# Patient Record
Sex: Female | Born: 1955 | Race: White | Hispanic: No | Smoking: Former smoker
Health system: Southern US, Community
[De-identification: ages and names within clinical notes are randomized; demographics above are authoritative.]

## PROBLEM LIST (undated history)

## (undated) DIAGNOSIS — C44300 Unspecified malignant neoplasm of skin of unspecified part of face: Secondary | ICD-10-CM

## (undated) DIAGNOSIS — I10 Essential (primary) hypertension: Secondary | ICD-10-CM

## (undated) DIAGNOSIS — K219 Gastro-esophageal reflux disease without esophagitis: Secondary | ICD-10-CM

## (undated) DIAGNOSIS — E785 Hyperlipidemia, unspecified: Secondary | ICD-10-CM

## (undated) DIAGNOSIS — F329 Major depressive disorder, single episode, unspecified: Secondary | ICD-10-CM

## (undated) DIAGNOSIS — B009 Herpesviral infection, unspecified: Secondary | ICD-10-CM

## (undated) DIAGNOSIS — I499 Cardiac arrhythmia, unspecified: Secondary | ICD-10-CM

## (undated) DIAGNOSIS — F419 Anxiety disorder, unspecified: Secondary | ICD-10-CM

## (undated) DIAGNOSIS — Z9689 Presence of other specified functional implants: Secondary | ICD-10-CM

## (undated) DIAGNOSIS — R000 Tachycardia, unspecified: Secondary | ICD-10-CM

## (undated) DIAGNOSIS — K5289 Other specified noninfective gastroenteritis and colitis: Secondary | ICD-10-CM

## (undated) DIAGNOSIS — Z79899 Other long term (current) drug therapy: Secondary | ICD-10-CM

## (undated) DIAGNOSIS — R7303 Prediabetes: Secondary | ICD-10-CM

## (undated) DIAGNOSIS — S72002K Fracture of unspecified part of neck of left femur, subsequent encounter for closed fracture with nonunion: Secondary | ICD-10-CM

## (undated) DIAGNOSIS — Z87442 Personal history of urinary calculi: Secondary | ICD-10-CM

## (undated) DIAGNOSIS — F32A Depression, unspecified: Secondary | ICD-10-CM

## (undated) DIAGNOSIS — J209 Acute bronchitis, unspecified: Secondary | ICD-10-CM

## (undated) HISTORY — DX: Other specified noninfective gastroenteritis and colitis: K52.89

## (undated) HISTORY — DX: Gastro-esophageal reflux disease without esophagitis: K21.9

## (undated) HISTORY — DX: Hyperlipidemia, unspecified: E78.5

## (undated) HISTORY — PX: APPENDECTOMY: SHX54

## (undated) HISTORY — DX: Prediabetes: R73.03

## (undated) HISTORY — PX: ABDOMINAL HYSTERECTOMY: SHX81

## (undated) HISTORY — PX: BACK SURGERY: SHX140

## (undated) HISTORY — PX: OTHER SURGICAL HISTORY: SHX169

## (undated) HISTORY — DX: Tachycardia, unspecified: R00.0

## (undated) HISTORY — PX: SHOULDER SURGERY: SHX246

## (undated) HISTORY — PX: BREAST EXCISIONAL BIOPSY: SUR124

## (undated) HISTORY — DX: Presence of other specified functional implants: Z96.89

## (undated) HISTORY — DX: Acute bronchitis, unspecified: J20.9

## (undated) HISTORY — DX: Other long term (current) drug therapy: Z79.899

## (undated) HISTORY — DX: Herpesviral infection, unspecified: B00.9

## (undated) HISTORY — DX: Unspecified malignant neoplasm of skin of unspecified part of face: C44.300

---

## 2004-11-24 ENCOUNTER — Ambulatory Visit: Payer: Self-pay | Admitting: Occupational Therapy

## 2005-12-18 ENCOUNTER — Ambulatory Visit: Payer: Self-pay | Admitting: Internal Medicine

## 2006-01-12 ENCOUNTER — Ambulatory Visit: Payer: Self-pay | Admitting: Internal Medicine

## 2006-07-02 ENCOUNTER — Ambulatory Visit: Payer: Self-pay | Admitting: Internal Medicine

## 2006-09-26 ENCOUNTER — Ambulatory Visit: Payer: Self-pay | Admitting: Gastroenterology

## 2006-10-04 ENCOUNTER — Ambulatory Visit: Payer: Self-pay | Admitting: Internal Medicine

## 2007-09-23 ENCOUNTER — Ambulatory Visit: Payer: Self-pay | Admitting: Internal Medicine

## 2007-10-08 ENCOUNTER — Ambulatory Visit: Payer: Self-pay | Admitting: Internal Medicine

## 2007-10-10 ENCOUNTER — Ambulatory Visit: Payer: Self-pay | Admitting: Internal Medicine

## 2008-07-20 ENCOUNTER — Ambulatory Visit (HOSPITAL_COMMUNITY): Admission: RE | Admit: 2008-07-20 | Discharge: 2008-07-20 | Payer: Self-pay | Admitting: Urology

## 2008-12-11 ENCOUNTER — Ambulatory Visit: Payer: Self-pay | Admitting: Family Medicine

## 2008-12-11 DIAGNOSIS — I471 Supraventricular tachycardia: Secondary | ICD-10-CM | POA: Insufficient documentation

## 2008-12-11 DIAGNOSIS — K219 Gastro-esophageal reflux disease without esophagitis: Secondary | ICD-10-CM | POA: Insufficient documentation

## 2008-12-11 DIAGNOSIS — M810 Age-related osteoporosis without current pathological fracture: Secondary | ICD-10-CM | POA: Insufficient documentation

## 2008-12-11 DIAGNOSIS — E876 Hypokalemia: Secondary | ICD-10-CM | POA: Insufficient documentation

## 2008-12-11 DIAGNOSIS — Z87442 Personal history of urinary calculi: Secondary | ICD-10-CM | POA: Insufficient documentation

## 2008-12-11 DIAGNOSIS — M159 Polyosteoarthritis, unspecified: Secondary | ICD-10-CM | POA: Insufficient documentation

## 2008-12-11 DIAGNOSIS — J309 Allergic rhinitis, unspecified: Secondary | ICD-10-CM | POA: Insufficient documentation

## 2008-12-11 DIAGNOSIS — G43009 Migraine without aura, not intractable, without status migrainosus: Secondary | ICD-10-CM | POA: Insufficient documentation

## 2008-12-11 DIAGNOSIS — N3946 Mixed incontinence: Secondary | ICD-10-CM | POA: Insufficient documentation

## 2008-12-11 DIAGNOSIS — F341 Dysthymic disorder: Secondary | ICD-10-CM | POA: Insufficient documentation

## 2008-12-22 ENCOUNTER — Ambulatory Visit (HOSPITAL_COMMUNITY): Admission: RE | Admit: 2008-12-22 | Discharge: 2008-12-22 | Payer: Self-pay | Admitting: Family Medicine

## 2009-01-08 ENCOUNTER — Ambulatory Visit (HOSPITAL_COMMUNITY): Admission: RE | Admit: 2009-01-08 | Discharge: 2009-01-08 | Payer: Self-pay | Admitting: Orthopedic Surgery

## 2009-05-14 ENCOUNTER — Encounter: Payer: Self-pay | Admitting: Family Medicine

## 2009-05-14 ENCOUNTER — Ambulatory Visit: Payer: Self-pay | Admitting: Family Medicine

## 2009-05-14 DIAGNOSIS — K5289 Other specified noninfective gastroenteritis and colitis: Secondary | ICD-10-CM

## 2009-05-14 HISTORY — DX: Other specified noninfective gastroenteritis and colitis: K52.89

## 2009-05-16 ENCOUNTER — Emergency Department: Payer: Self-pay | Admitting: Emergency Medicine

## 2009-06-29 ENCOUNTER — Ambulatory Visit: Payer: Self-pay | Admitting: Family Medicine

## 2009-06-29 DIAGNOSIS — E782 Mixed hyperlipidemia: Secondary | ICD-10-CM | POA: Insufficient documentation

## 2009-06-29 LAB — CONVERTED CEMR LAB: LDL Cholesterol: 167 mg/dL

## 2009-07-01 ENCOUNTER — Encounter (INDEPENDENT_AMBULATORY_CARE_PROVIDER_SITE_OTHER): Payer: Self-pay | Admitting: *Deleted

## 2009-07-01 LAB — CONVERTED CEMR LAB
ALT: 16 units/L (ref 0–35)
AST: 24 units/L (ref 0–37)
Alkaline Phosphatase: 53 units/L (ref 39–117)
Bilirubin, Direct: 0.1 mg/dL (ref 0.0–0.3)
Chloride: 107 meq/L (ref 96–112)
Cholesterol: 269 mg/dL — ABNORMAL HIGH (ref 0–200)
Direct LDL: 167.9 mg/dL
HDL: 42.3 mg/dL (ref >39.00)
Sodium: 142 meq/L (ref 135–145)
Total CHOL/HDL Ratio: 6
Total Protein: 7.5 g/dL (ref 6.0–8.3)
Triglycerides: 258 mg/dL — ABNORMAL HIGH (ref 0.0–149.0)
VLDL: 51.6 mg/dL — ABNORMAL HIGH (ref 0.0–40.0)

## 2009-07-16 ENCOUNTER — Ambulatory Visit: Payer: Self-pay | Admitting: Family Medicine

## 2009-08-09 ENCOUNTER — Ambulatory Visit (HOSPITAL_COMMUNITY): Admission: RE | Admit: 2009-08-09 | Discharge: 2009-08-09 | Payer: Self-pay | Admitting: Family Medicine

## 2009-08-09 ENCOUNTER — Encounter: Payer: Self-pay | Admitting: Family Medicine

## 2009-08-13 ENCOUNTER — Ambulatory Visit: Payer: Self-pay | Admitting: Family Medicine

## 2009-08-17 DIAGNOSIS — M8580 Other specified disorders of bone density and structure, unspecified site: Secondary | ICD-10-CM | POA: Insufficient documentation

## 2009-08-18 ENCOUNTER — Encounter (INDEPENDENT_AMBULATORY_CARE_PROVIDER_SITE_OTHER): Payer: Self-pay | Admitting: *Deleted

## 2009-11-23 ENCOUNTER — Ambulatory Visit: Payer: Self-pay | Admitting: Family Medicine

## 2009-12-03 ENCOUNTER — Ambulatory Visit: Payer: Self-pay | Admitting: Family Medicine

## 2009-12-03 DIAGNOSIS — K5909 Other constipation: Secondary | ICD-10-CM | POA: Insufficient documentation

## 2010-01-08 ENCOUNTER — Ambulatory Visit: Payer: Self-pay | Admitting: Family Medicine

## 2010-02-01 ENCOUNTER — Ambulatory Visit: Payer: Self-pay | Admitting: Family Medicine

## 2010-02-01 LAB — CONVERTED CEMR LAB
AST: 25 units/L (ref 0–37)
Basophils Absolute: 0 10*3/uL (ref 0.0–0.1)
Bilirubin, Direct: 0.1 mg/dL (ref 0.0–0.3)
Calcium: 9 mg/dL (ref 8.4–10.5)
Chloride: 108 meq/L (ref 96–112)
Direct LDL: 197.3 mg/dL
Eosinophils Relative: 1.9 % (ref 0.0–5.0)
GFR calc non Af Amer: 127.71 mL/min (ref >60)
Hemoglobin: 13.1 g/dL (ref 12.0–15.0)
Lymphocytes Relative: 39.3 % (ref 12.0–46.0)
MCHC: 35.2 g/dL (ref 30.0–36.0)
Monocytes Absolute: 0.5 10*3/uL (ref 0.1–1.0)
Neutrophils Relative %: 48.4 % (ref 43.0–77.0)
Platelets: 231 10*3/uL (ref 150.0–400.0)
Potassium: 4.1 meq/L (ref 3.5–5.1)
RBC: 4.05 M/uL (ref 3.87–5.11)
RDW: 14 % (ref 11.5–14.6)
TSH: 0.7 microintl units/mL (ref 0.35–5.50)
Total Bilirubin: 0.5 mg/dL (ref 0.3–1.2)
Total Protein: 7 g/dL (ref 6.0–8.3)

## 2010-02-09 ENCOUNTER — Ambulatory Visit: Payer: Self-pay | Admitting: Family Medicine

## 2010-03-01 ENCOUNTER — Telehealth: Payer: Self-pay | Admitting: Family Medicine

## 2010-05-12 ENCOUNTER — Ambulatory Visit: Payer: Self-pay | Admitting: Family Medicine

## 2010-06-14 ENCOUNTER — Encounter: Payer: Self-pay | Admitting: Family Medicine

## 2010-06-19 ENCOUNTER — Encounter: Payer: Self-pay | Admitting: Family Medicine

## 2010-06-23 ENCOUNTER — Emergency Department (HOSPITAL_COMMUNITY)
Admission: EM | Admit: 2010-06-23 | Discharge: 2010-06-23 | Payer: Self-pay | Source: Home / Self Care | Admitting: Emergency Medicine

## 2010-06-25 ENCOUNTER — Encounter: Payer: Self-pay | Admitting: Family Medicine

## 2010-06-25 ENCOUNTER — Ambulatory Visit
Admission: RE | Admit: 2010-06-25 | Discharge: 2010-06-25 | Payer: Self-pay | Source: Home / Self Care | Attending: Family Medicine | Admitting: Family Medicine

## 2010-06-25 DIAGNOSIS — J209 Acute bronchitis, unspecified: Secondary | ICD-10-CM | POA: Insufficient documentation

## 2010-06-25 HISTORY — DX: Acute bronchitis, unspecified: J20.9

## 2010-06-27 ENCOUNTER — Encounter: Payer: Self-pay | Admitting: Family Medicine

## 2010-06-30 NOTE — Progress Notes (Signed)
Summary: needs something for indegestion  Phone Note Call from Patient Call back at Home Phone [phone]   Caller: Patient Call For: Kerby Nora MD Summary of Call: Pt has been taking otc prilosec twice a day for indigestion.  This is not helping, she would like to try something else, she says indigestion is getting really bad.   Uses [facility] [address]. Initial call taken by: Lowella Petties CMA,  March 01, 2010 3:32 PM  Follow-up for Phone Call         MAke sure no exertional chest pain, shortness of breath or new fatigue...if so need appt to be seen ASAP. Otherwise...will start on pantoprazole..if no improvement in 1-2 weks..needs appt to be seen.  Follow-up by: Kerby Nora MD,  March 01, 2010 3:38 PM  Additional Follow-up for Phone Call Additional follow up Details #1::        Patient advised via message on machine.Consuello Masse CMA   Additional Follow-up by: Benny Lennert CMA Duncan Dull),  March 02, 2010 10:35 AM    New/Updated Medications: PANTOPRAZOLE SODIUM 40 MG TBEC (PANTOPRAZOLE SODIUM) 1 tab by mouth daily Prescriptions: PANTOPRAZOLE SODIUM 40 MG TBEC (PANTOPRAZOLE SODIUM) 1 tab by mouth daily  #30 x 5   Entered and Authorized by:   Kerby Nora MD   Signed by:   Kerby Nora MD on 03/01/2010   Method used:   Electronically to        [facility] [address]ail)       [address], [address]       [place], [place]  [address]       Ph: [phone]       Fax: [phone]   RxID:   [id number]

## 2010-06-30 NOTE — Assessment & Plan Note (Signed)
Summary: DISCUSS MEDICATION,BACK PAIN/CLE   Vital Signs:  Patient profile:   55 year old female Height:      62 inches Weight:      147.4 pounds BMI:     27.06 Temp:     98.6 degrees F oral Pulse rate:   88 / minute Pulse rhythm:   regular BP sitting:   90 / 60  (left arm) Cuff size:   regular  Vitals Entered By: Benny Lennert CMA Duncan Dull) (December 03, 2009 3:07 PM)  History of Present Illness: Chief complaint Back pain and discuss medication  Chronic back pain and chronic leg pain..continues...she is interested in Cymbalta. Muscle relaxant helps minimally, does not have hydrocodone to use. Pain range range 5-10 on pain scale.   Not on any prescription pain meds.  Depression..moderate control on venlafaxine. No current problems with venlafaxine.   Constipation and bloating after mealsx years, worse in last few meals.. Eats fiber, yogurt. Pain with BMs, firm stools and straining. No abdominal pain. Feels better once she has BM. No blood in stool.  Last colonoscopy 2008 nml  Problems Prior to Update: 1)  Otitis Externa  (ICD-380.10) 2)  Cerumen Impaction, Right  (ICD-380.4) 3)  Osteopenia  (ICD-733.90) 4)  Routine Gynecological Examination  (ICD-V72.31) 5)  Preventive Health Care  (ICD-V70.0) 6)  Other Screening Mammogram  (ICD-V76.12) 7)  Hyperlipidemia  (ICD-272.4) 8)  Gastroenteritis Without Dehydration  (ICD-558.9) 9)  Supraventricular Tachycardia  (ICD-427.89) 10)  Leg Pain, Chronic, Left  (ICD-729.5) 11)  Hypokalemia  (ICD-276.8) 12)  Anxiety Depression  (ICD-300.4) 13)  Urinary Incontinence, Mixed  (ICD-788.33) 14)  Migraine, Common  (ICD-346.10) 15)  Nephrolithiasis, Hx of  (ICD-V13.01) 16)  Unspecified Osteoporosis  (ICD-733.00) 17)  Allergic Rhinitis  (ICD-477.9) 18)  Back Pain, Lumbar, Chronic  (ICD-724.2) 19)  Gerd  (ICD-530.81) 20)  Osteoarthritis, Generalized, 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 Takes 2 Tablets By Mouth Once Daily 2)  Hydrocodone-Acetaminophen 5-325 Mg Tabs (Hydrocodone-Acetaminophen) .... Take 1 Tablet By Mouth Two Times A Day As Needed 3)  Nadolol 20 Mg Tabs (Nadolol) .... Take 1 Tablet By Mouth Once A Day 4)  Alendronate Sodium 70 Mg Tabs (Alendronate Sodium) .... Take 1 Tablet By Mouth Weekly 5)  Fiberchoice Plus Calcium 2-250-100 Gm-Mg-Unit Chew (Inulin-Calcium-Vitamin D) .... Takes 2 By Mouth Once Daily 6)  Cyclobenzaprine Hcl 10 Mg Tabs (Cyclobenzaprine Hcl) .... Take One Tab Once Daily 7)  Fish Oil 1000 Mg Caps (Omega-3 Fatty Acids) .... 2 Grams Daily 8)  Aspir-Low 81 Mg Tbec (Aspirin) .... Once Daily 9)  Venlafaxine Hcl 37.5 Mg Tabs (Venlafaxine Hcl) .Marland Kitchen.. 1 Tab By Mouth Two Times A Day X 1 Week, Then Increase To 2 Tabs Two Times A Day  Allergies: 1)  ! Darvon 2)  ! Codeine 3)  ! Levaquin  Past History:  Past medical, surgical, family and social histories (including risk factors) reviewed, and no changes noted (except as noted below).  Past Medical History: Reviewed history from 12/11/2008 and no changes required. GERD Allergic rhinitis  Past Surgical History: Reviewed history from 12/11/2008 and no changes required. Lithotripsy 06/2008..................................................Marland KitchenDr. Annabell Howells shoulder surgery 1995..bone spur.........................Marland KitchenDr. Rennis Chris in [place] [facility] Back surgery x 2 2001/2000, for herniated disc.....Dr. Krista Blue in [place] 1972 breast bx: nml  Appendectomy Tonsillectomy Hysterectomy, partial , both ovaries remain...for endometriosis, no CA............Marland KitchenDr. Barnabas Lister  Family History: Reviewed history from 12/11/2008 and no changes required. father: CAD, MI age 25,DM, HTN, high  chol mother: CAD, Mi age 89s,  DM, high chol, HTN sister: DM MGM:DM PGM: DM  Social History: Reviewed history from 12/11/2008 and no changes required. Occupation: Consulting civil engineer Divorced 2 children: healthy Never  Smoked Alcohol use-yes, 1 drink every few months Drug use-no Regular exercise-yes,  walk Diet: skip meals, food on the run  Physical Exam  General:  overweight appearing female in NAD  Mouth:  Oral mucosa and oropharynx without lesions or exudates.  Teeth in good repair. Neck:  no carotid bruit or thyromegaly no cervical or supraclavicular lymphadenopathy  Lungs:  Normal respiratory effort, chest expands symmetrically. Lungs are clear to auscultation, no crackles or wheezes. Heart:  Normal rate and regular rhythm. S1 and S2 normal without gallop, murmur, click, rub or other extra sounds. Abdomen:  Bowel sounds positive,abdomen soft and non-tender without masses, organomegaly or hernias noted. Pulses:  R and L posterior tibial pulses are full and equal bilaterally  Extremities:  no edema Skin:  Intact without suspicious lesions or rashes Psych:  Cognition and judgment appear intact. Alert and cooperative with normal attention span and concentration. No apparent delusions, illusions, hallucinations   Impression & Recommendations:  Problem # 1:  ANXIETY DEPRESSION (ICD-300.4)  Moderate control.  See changes below.   Problem # 2:  BACK PAIN, LUMBAR, CHRONIC (ICD-724.2) Stop venlafaxine and change to cymbalta.Marland Kitchentitrate from 30 to 60 mg daily. Discussed SE .  The following medications were removed from the medication list:    Hydrocodone-acetaminophen 5-325 Mg Tabs (Hydrocodone-acetaminophen) .Marland Kitchen... Take 1 tablet by mouth two times a day as needed    Cyclobenzaprine Hcl 10 Mg Tabs (Cyclobenzaprine hcl) .Marland Kitchen... Take one tab once daily Her updated medication list for this problem includes:    Aspir-low 81 Mg Tbec (Aspirin) ..... Once daily  Problem # 3:  CONSTIPATION, CHRONIC (ICD-564.09) Consistent with constipation predominant IBS. Chevck TSH and for anemia. 06/2009 CMEt was in nml range.  Increase exercsie, water and fiber. Start miralax and align. Call if not improving as expected.  Her  updated medication list for this problem includes:    Fiberchoice Plus Calcium 2-250-100 Gm-mg-unit Chew (Inulin-calcium-vitamin d) .Marland Kitchen... Takes 2 by mouth once daily  Complete Medication List: 1)  Potassium 99 Mg Tabs (Potassium) .... Takes 2 tablets by mouth once daily 2)  Nadolol 20 Mg Tabs (Nadolol) .... Take 1 tablet by mouth once a day 3)  Alendronate Sodium 70 Mg Tabs (Alendronate sodium) .... Take 1 tablet by mouth weekly 4)  Fiberchoice Plus Calcium 2-250-100 Gm-mg-unit Chew (Inulin-calcium-vitamin d) .... Takes 2 by mouth once daily 5)  Fish Oil 1000 Mg Caps (Omega-3 fatty acids) .... 2 grams daily 6)  Aspir-low 81 Mg Tbec (Aspirin) .... Once daily 7)  Cymbalta 30 Mg Cpep (Duloxetine hcl) .Marland Kitchen.. 1 tab by mouth daily x 1week, then increase to 2 tab by mouth daily  if working well call for refills at 60 mg dose.  Patient Instructions: 1)  Fasting lipids, CMET, Dx 272.0, TSH, cbc Dx 564.09 2)  Increase water intake. 3)  Increase fiber in diet. 4)   Increase exerise. 5)  Miralax daily. 6)  Try align OTC.  7)   Start cymbalta 30 mg daily x 1 week then change to 60 mg daily.  Prescriptions: CYMBALTA 30 MG CPEP (DULOXETINE HCL) 1 tab by mouth daily x 1week, then increase to 2 tab by mouth daily  If working well call for refills at 60 mg dose.  #60 x 1  Entered and Authorized by:   Kerby Nora MD   Signed by:   Kerby Nora MD on 12/03/2009   Method used:   Electronically to        [facility] [address]ail)       [address], [address]       [place], [place]  [address]       Ph: [phone]       Fax: [phone]   RxID:   [id number]   Current Allergies (reviewed today): ! DARVON ! CODEINE ! LEVAQUIN

## 2010-06-30 NOTE — Letter (Signed)
Summary: Results Follow up Letter  [place] [facility]  [address] [place], [place] [address]   Phone: [phone]  Fax: [phone]    08/18/2009 MRN: [id number]     [facility] [address] [place], [place]  [address]    Dear Ms. Sitzmann,  The following are the results of your recent test(s):  Test         Result    Pap Smear:        Normal _____  Not Normal _____ Comments: ______________________________________________________ Cholesterol: LDL(Bad cholesterol):         Your goal is less than:         HDL (Good cholesterol):       Your goal is more than: Comments:  ______________________________________________________ Mammogram:        Normal _____  Not Normal _____ Comments:  ___________________________________________________________________ Hemoccult:        Normal _____  Not normal _______ Comments:    _____________________________________________________________________ Other Tests: Bone Density Notify patient of results. Recommend Calcium and vit D 600 mg/400 International Units two times a day  and weight bearing exercise. Will recheck in 2 years. Kerby Nora MD  August 17, 2009 5:03 PM    We routinely do not discuss normal results over the telephone.  If you desire a copy of the results, or you have any questions about this information we can discuss them at your next office visit.   Sincerely,    Kerby Nora MD

## 2010-06-30 NOTE — Letter (Signed)
Summary: Out of Work  [facility]  [address]   [place], [place] [address]   Phone: [phone]  Fax: [phone]    June 25, 2010   Employee:  DELANCEY MORAES    To Whom It May Concern:   For Medical reasons, please excuse the above named employee from work for the following dates:  Start:   06/25/2010  Return:   06/28/2010  If you need additional information, please feel free to contact our office.         Sincerely,    Hassan Rowan MD

## 2010-06-30 NOTE — Assessment & Plan Note (Signed)
Summary: ROA FOR 1 MONTH FOLLOW-UP/JRR   Vital Signs:  Patient profile:   55 year old female Height:      62.25 inches Weight:      144.0 pounds BMI:     26.22 Temp:     98.7 degrees F oral Pulse rate:   72 / minute Pulse rhythm:   regular BP sitting:   110 / 70  (left arm) Cuff size:   regular  Vitals Entered By: Benny Lennert CMA Duncan Dull) (August 13, 2009 9:00 AM)  History of Present Illness: Chief complaint 1 month follow up medication change  Anxiety/ Depression: on increased dose 40 mg of celexa. Higher dose caused headaches..tried taking lower dose twice daily.Marland Kitchenstill SE.  Was only able to take for a week. Continued Anhedonia, lack of motivation. Tries to work, comes home and goes to sleep. Insomnia, wakes up at night alot.   Has tried cymbalta in past..changed due to financial reasons...not as effective.    GERD, resolved on omeprazole.  Problems Prior to Update: 1)  Routine Gynecological Examination  (ICD-V72.31) 2)  Preventive Health Care  (ICD-V70.0) 3)  Other Screening Mammogram  (ICD-V76.12) 4)  Hyperlipidemia  (ICD-272.4) 5)  Gastroenteritis Without Dehydration  (ICD-558.9) 6)  Supraventricular Tachycardia  (ICD-427.89) 7)  Leg Pain, Chronic, Left  (ICD-729.5) 8)  Hypokalemia  (ICD-276.8) 9)  Anxiety Depression  (ICD-300.4) 10)  Urinary Incontinence, Mixed  (ICD-788.33) 11)  Migraine, Common  (ICD-346.10) 12)  Nephrolithiasis, Hx of  (ICD-V13.01) 13)  Unspecified Osteoporosis  (ICD-733.00) 14)  Allergic Rhinitis  (ICD-477.9) 15)  Back Pain, Lumbar, Chronic  (ICD-724.2) 16)  Gerd  (ICD-530.81) 17)  Osteoarthritis, Generalized, Hand  (ICD-715.04)  Current Medications (verified): 1)  Potassium 99 Mg Tabs (Potassium) .... Takes 2 Tablets By Mouth Once Daily 2)  Hydrocodone-Acetaminophen 5-325 Mg Tabs (Hydrocodone-Acetaminophen) .... Take 1 Tablet By Mouth Two Times A Day As Needed 3)  Nadolol 20 Mg Tabs (Nadolol) .... Take 1 Tablet By Mouth Once A Day 4)   Alendronate Sodium 70 Mg Tabs (Alendronate Sodium) .... Take 1 Tablet By Mouth Weekly 5)  Fiberchoice Plus Calcium 2-250-100 Gm-Mg-Unit Chew (Inulin-Calcium-Vitamin D) .... Takes 2 By Mouth Once Daily 6)  Cyclobenzaprine Hcl 10 Mg Tabs (Cyclobenzaprine Hcl) .... Take One Tab Once Daily 7)  Fish Oil 1000 Mg Caps (Omega-3 Fatty Acids) .... 2 Grams Daily 8)  Aspir-Low 81 Mg Tbec (Aspirin) .... Once Daily 9)  Venlafaxine Hcl 37.5 Mg Tabs (Venlafaxine Hcl) .Marland Kitchen.. 1 Tab By Mouth Two Times A Day X 1 Week, Then Increase To 2 Tabs Two Times A Day  Allergies: 1)  ! Darvon 2)  ! Codeine 3)  ! Levaquin  Past History:  Past medical, surgical, family and social histories (including risk factors) reviewed, and no changes noted (except as noted below).  Past Medical History: Reviewed history from 12/11/2008 and no changes required. GERD Allergic rhinitis  Past Surgical History: Reviewed history from 12/11/2008 and no changes required. Lithotripsy 06/2008..................................................Marland KitchenDr. Annabell Howells shoulder surgery 1995..bone spur.........................Marland KitchenDr. Rennis Chris in [place] [facility] Back surgery x 2 2001/2000, for herniated disc.....Dr. Krista Blue in [place] 1972 breast bx: nml  Appendectomy Tonsillectomy Hysterectomy, partial , both ovaries remain...for endometriosis, no CA............Marland KitchenDr. Barnabas Lister  Family History: Reviewed history from 12/11/2008 and no changes required. father: CAD, MI age 10,DM, HTN, high chol mother: CAD, Mi age 43s,  DM, high chol, HTN sister: DM MGM:DM PGM: DM  Social History: Reviewed history from 12/11/2008 and no changes required. 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 CV:  Denies chest pain or discomfort. Resp:  Denies shortness of  breath. GI:  Denies indigestion, loss of appetite, nausea, and vomiting.  Physical Exam  General:  Well-developed,well-nourished,in no acute distress; alert,appropriate and cooperative throughout examination Mouth:  MMM Abdomen:  Bowel sounds positive,abdomen soft and non-tender without masses, organomegaly or hernias noted. Psych:  Cognition and judgment appear intact. Alert and cooperative with normal attention span and concentration. No apparent delusions, illusions, hallucinations   Impression & Recommendations:  Problem # 1:  ANXIETY DEPRESSION (ICD-300.4) Poor control..change to generic two times a day venlafaxine. Follow up in 1 month. Discussed SE, length of treatment and expected lenght of time till effective.   Complete Medication List: 1)  Potassium 99 Mg Tabs (Potassium) .... Takes 2 tablets by mouth once daily 2)  Hydrocodone-acetaminophen 5-325 Mg Tabs (Hydrocodone-acetaminophen) .... Take 1 tablet by mouth two times a day as needed 3)  Nadolol 20 Mg Tabs (Nadolol) .... Take 1 tablet by mouth once a day 4)  Alendronate Sodium 70 Mg Tabs (Alendronate sodium) .... Take 1 tablet by mouth weekly 5)  Fiberchoice Plus Calcium 2-250-100 Gm-mg-unit Chew (Inulin-calcium-vitamin d) .... Takes 2 by mouth once daily 6)  Cyclobenzaprine Hcl 10 Mg Tabs (Cyclobenzaprine hcl) .... Take one tab once daily 7)  Fish Oil 1000 Mg Caps (Omega-3 fatty acids) .... 2 grams daily 8)  Aspir-low 81 Mg Tbec (Aspirin) .... Once daily 9)  Venlafaxine Hcl 37.5 Mg Tabs (Venlafaxine hcl) .Marland Kitchen.. 1 tab by mouth two times a day x 1 week, then increase to 2 tabs two times a day  Patient Instructions: 1)  Please schedule a follow-up appointment in 1 month depression.  Prescriptions: VENLAFAXINE HCL 37.5 MG TABS (VENLAFAXINE HCL) 1 tab by mouth two times a day x 1 week, then increase to 2 tabs two times a day  #120 x 3   Entered and Authorized by:   Kerby Nora MD   Signed by:   Kerby Nora MD on 08/13/2009    Method used:   Electronically to        [facility] [address]ail)       [address], [address]       [place], [place]  [address]       Ph: [phone]       Fax: [phone]   RxID:   [id number]   Current Allergies (reviewed today): ! DARVON ! CODEINE ! LEVAQUIN

## 2010-06-30 NOTE — Letter (Signed)
Summary: Generic Letter  [place] [facility]  [address] [place], [place] [address]   Phone: [phone]  Fax: [phone]    07/01/2009    [name] [address] [name], [place]  [address]  Dear Ms. Fullington,   Cholesterol diet information is enclosed.  Please work on diet, exercise and weight loss and reschedule your appointment for the physical exam as soon as possible.  Please talk with Wanda [name] to reschedule so that she will remember the situation concerning the mix-up and can hopefully work with you to get it scheduled.    Sincerely,   Lugene Fuquay CMA (AAMA)

## 2010-06-30 NOTE — Assessment & Plan Note (Signed)
Summary: EARACHE/JBB   Vital Signs:  Patient Profile:   55 Years Old Female CC:      Right Ear Ache, With Headache / rwt Height:     62 inches Weight:      146 pounds BMI:     26.80 O2 Sat:      99 % O2 treatment:    Room Air Temp:     97.5 degrees F oral Pulse rate:   88 / minute Pulse rhythm:   regular Resp:     18 per minute BP sitting:   121 / 85  (left arm)  Pt. in pain?   yes    Location:   head    Intensity:   7    Type:       aching  Vitals Entered By: Levonne Spiller EMT-P (November 23, 2009 11:54 AM)              Is Patient Diabetic? No      Current Allergies: ! DARVON ! CODEINE ! LEVAQUINHistory of Present Illness History from: patient Reason for visit: see chief complaint Chief Complaint: Right Ear Ache, With Headache / rwt History of Present Illness: Has been having pain in her right ear for about 3 days. The pain radiates down to her jaw, behind her ear and to the front of the ear. She used peroxide in the ear without relief. She has sinus troubles off and on, but has not had much trouble with them lately. Some mild rhinorrhea. No f/c. No otorrhea. + decrease in hearing and has been hearing some popping.   REVIEW OF SYSTEMS Constitutional Symptoms       Complains of night sweats.     Denies fever, chills, weight loss, weight gain, and fatigue.  Eyes       Denies change in vision, eye pain, eye discharge, glasses, contact lenses, and eye surgery. Ear/Nose/Throat/Mouth       Complains of change in hearing, ear pain, and sinus problems.      Denies hearing loss/aids, ear discharge, dizziness, frequent runny nose, frequent nose bleeds, sore throat, hoarseness, and tooth pain or bleeding.  Respiratory       Denies dry cough, productive cough, wheezing, shortness of breath, asthma, bronchitis, and emphysema/COPD.  Cardiovascular       Denies murmurs, chest pain, and tires easily with exhertion.    Gastrointestinal       Complains of constipation and indigestion.       Denies stomach pain, nausea/vomiting, diarrhea, and blood in bowel movements. Genitourniary       Denies painful urination, kidney stones, and loss of urinary control. Neurological       Complains of headaches.      Denies paralysis, seizures, and fainting/blackouts. Musculoskeletal       Complains of muscle pain and joint pain.      Denies joint stiffness, decreased range of motion, redness, swelling, muscle weakness, and gout.  Skin       Denies bruising, unusual mles/lumps or sores, and hair/skin or nail changes.  Psych       Denies mood changes, temper/anger issues, anxiety/stress, speech problems, depression, and sleep problems. Physical Exam General appearance: well developed, well nourished, no acute distress Head: mild right maxillary tenderness.  Ears: left TM and canal normal. right canal occluded with cerumen - erythematous and cleared after irrigation. Nasal: sounds nasally - with occ whistle through her nose - turbinates red but small amound of congestion Oral/Pharynx: tongue normal, posterior  pharynx without erythema or exudate Neck: neck supple,  trachea midline, no masses, nontender lymphadenopathy Chest/Lungs: no rales, wheezes, or rhonchi bilateral, breath sounds equal without effort Heart: regular rate and  rhythm, no murmur MSE: oriented to time, place, and person Assessment New Problems: OTITIS EXTERNA (ICD-380.10) CERUMEN IMPACTION, RIGHT (ICD-380.4)   Plan New Medications/Changes: CIPRODEX 0.3-0.1 % SUSP (CIPROFLOXACIN-DEXAMETHASONE) 4 gtts two times a day in right ear  #1 btl x 0, 11/23/2009, Tacey Ruiz MD  New Orders: Est. Patient Level III [[address]] Cerumen Impaction Removal [69210]  The patient and/or caregiver has been counseled thoroughly with regard to medications prescribed including dosage, schedule, interactions, rationale for use, and possible side effects and they verbalize understanding.  Diagnoses and expected course of recovery discussed and  will return if not improved as expected or if the condition worsens. Patient and/or caregiver verbalized understanding.  Prescriptions: CIPRODEX 0.3-0.1 % SUSP (CIPROFLOXACIN-DEXAMETHASONE) 4 gtts two times a day in right ear  #1 btl x 0   Entered and Authorized by:   Tacey Ruiz MD   Signed by:   Tacey Ruiz MD on 11/23/2009   Method used:   Electronically to        [facility] [address]       [address], [address]       [place], [place]  [address]       Ph: [phone]       Fax: [phone]   RxID:   [id number]   Orders Added: 1)  Est. Patient Level III [44010] 2)  Cerumen Impaction Removal [27253]  The patient was informed that there is no on-call provider or services available at this clinic during off-hours (when the clinic is closed).  If the patient developed a problem or concern that required immediate attention, the patient was advised to go the the nearest available urgent care or emergency department for medical care.  The patient verbalized understanding.     The risks, benefits and possible side effects of the treatments and tests were explained clearly to the patient and the patient verbalized understanding.

## 2010-06-30 NOTE — Assessment & Plan Note (Signed)
Summary: F/U LABS/CLE   Vital Signs:  Patient profile:   55 year old female Height:      62 inches Weight:      147.0 pounds BMI:     26.98 Temp:     98.5 degrees F oral Pulse rate:   80 / minute Pulse rhythm:   regular BP sitting:   106 / 70  (left arm) Cuff size:   regular  Vitals Entered By: Benny Lennert CMA Duncan Dull) (February 09, 2010 3:04 PM)  History of Present Illness: Chief complaint follow up labs  Depression..Started cymbalta  60 mg daily...has noted 20% improvement in mood. No SI.  Still remains fatigued. (nml TSH,no anemia)  Constipation and bloating after mealsx years, worse in last few meals.. Eats fiber, yogurt. Pain with BMs, firm stools and straining. No abdominal pain. Feels better once she has BM. No blood in stool. Last colonoscopy 2008 nml   At last OV in 11/2009..recommended miralax and align.Marland KitchenMarland KitchenAlign helped some but still  Has tried increase fiber in diet. Never tried miralax.  Struggling to lose weight. Not exericising.   Problems Prior to Update: 1)  Constipation, Chronic  (ICD-564.09) 2)  Cerumen Impaction, Right  (ICD-380.4) 3)  Osteopenia  (ICD-733.90) 4)  Routine Gynecological Examination  (ICD-V72.31) 5)  Preventive Health Care  (ICD-V70.0) 6)  Other Screening Mammogram  (ICD-V76.12) 7)  Hyperlipidemia  (ICD-272.4) 8)  Gastroenteritis Without Dehydration  (ICD-558.9) 9)  Supraventricular Tachycardia  (ICD-427.89) 10)  Leg Pain, Chronic, Left  (ICD-729.5) 11)  Hypokalemia  (ICD-276.8) 12)  Anxiety Depression  (ICD-300.4) 13)  Urinary Incontinence, Mixed  (ICD-788.33) 14)  Migraine, Common  (ICD-346.10) 15)  Nephrolithiasis, Hx of  (ICD-V13.01) 16)  Unspecified Osteoporosis  (ICD-733.00) 17)  Allergic Rhinitis  (ICD-477.9) 18)  Back Pain, Lumbar, Chronic  (ICD-724.2) 19)  Gerd  (ICD-530.81) 20)  Osteoarthritis, Generalized, Hand  (ICD-715.04)  Current Medications (verified): 1)  Potassium 99 Mg Tabs (Potassium) .... Takes 2  Tablets By Mouth Once Daily 2)  Nadolol 20 Mg Tabs (Nadolol) .... Take 1 Tablet By Mouth Once A Day 3)  Alendronate Sodium 70 Mg Tabs (Alendronate Sodium) .... Take 1 Tablet By Mouth Weekly 4)  Fiberchoice Plus Calcium 2-250-100 Gm-Mg-Unit Chew (Inulin-Calcium-Vitamin D) .... Takes 2 By Mouth Once Daily 5)  Fish Oil 1000 Mg Caps (Omega-3 Fatty Acids) .... 2 Grams Daily 6)  Aspir-Low 81 Mg Tbec (Aspirin) .... Once Daily 7)  Cymbalta 30 Mg Cpep (Duloxetine Hcl) .Marland Kitchen.. 1 Tab By Mouth Daily X 1week, Then Increase To 2 Tab By Mouth Daily  If Working Well Call For Refills At 60 Mg Dose.  Allergies: 1)  ! Darvon 2)  ! Codeine 3)  ! Levaquin  Past History:  Past medical, surgical, family and social histories (including risk factors) reviewed, and no changes noted (except as noted below).  Past Medical History: Reviewed history from 12/11/2008 and no changes required. GERD Allergic rhinitis  Past Surgical History: Reviewed history from 12/11/2008 and no changes required. Lithotripsy 06/2008..................................................Marland KitchenDr. Annabell Howells shoulder surgery 1995..bone spur.........................Marland KitchenDr. Rennis Chris in [place] [facility] Back surgery x 2 2001/2000, for herniated disc.....Dr. Krista Blue in [place] 1972 breast bx: nml  Appendectomy Tonsillectomy Hysterectomy, partial , both ovaries remain...for endometriosis, no CA............Marland KitchenDr. Barnabas Lister  Family History: Reviewed history from 01/08/2010 and no changes required. father: CAD, MI age 102,DM, HTN, high chol mother: CAD, Mi age 81s,  DM, high chol, HTN sister: Diabetes Mellitus MGM:DM PGM: DM  Social History: Reviewed history from 01/08/2010 and no changes required. Occupation:  [employer] Divorced 2 children: healthy Never Smoked or used tobacco products Alcohol use-yes, 1 drink every few months Drug use-no Regular exercise-yes,  walk Diet: skip meals, food on the run  Review of Systems General:  Denies  fatigue and fever. CV:  Denies chest pain or discomfort. Resp:  Denies shortness of breath. GI:  Denies abdominal pain and bloody stools. GU:  Denies dysuria.  Physical Exam  General:  overweight appearing female in NAD  Mouth:  Oral mucosa and oropharynx without lesions or exudates.  Teeth in good repair. Neck:  no carotid bruit or thyromegaly no cervical or supraclavicular lymphadenopathy  Lungs:  Normal respiratory effort, chest expands symmetrically. Lungs are clear to auscultation, no crackles or wheezes. Heart:  Normal rate and regular rhythm. S1 and S2 normal without gallop, murmur, click, rub or other extra sounds. Abdomen:  Bowel sounds positive,abdomen soft and non-tender without masses, organomegaly or hernias noted. Pulses:  R and L posterior tibial pulses are full and equal bilaterally  Extremities:  no edema Skin:  Intact without suspicious lesions or rashes Psych:  Cognition and judgment appear intact. Alert and cooperative with normal attention span and concentration. No apparent delusions, illusions, hallucinations   Impression & Recommendations:  Problem # 1:  CONSTIPATION, CHRONIC (ICD-564.09) INcrease exercise. Trial of miralax. Continue Align. If no improvement consider amitiza. Her updated medication list for this problem includes:    Fiberchoice Plus Calcium 2-250-100 Gm-mg-unit Chew (Inulin-calcium-vitamin d) .Marland Kitchen... Takes 2 by mouth once daily  Problem # 2:  HYPERLIPIDEMIA (ICD-272.4) Inadequate control...has attempted to make diet changes. Strong family history of CAD and high cholesterol. Start pravatatin daily. Recheck fasting LIPIDS, AST, ALT  in 3 months Dx 272.0    Her updated medication list for this problem includes:    Pravastatin Sodium 40 Mg Tabs (Pravastatin sodium) .Marland Kitchen... Take 1 tablet by mouth once a day  Problem # 3:  ANXIETY DEPRESSION (ICD-300.4) Moderaete control. Work on behavoiral change. Increase exercsie. Continue cymbalta at 60 mg daily.     Complete Medication List: 1)  Potassium 99 Mg Tabs (Potassium) .... Takes 2 tablets by mouth once daily 2)  Nadolol 20 Mg Tabs (Nadolol) .... Take 1 tablet by mouth once a day 3)  Alendronate Sodium 70 Mg Tabs (Alendronate sodium) .... Take 1 tablet by mouth weekly 4)  Fiberchoice Plus Calcium 2-250-100 Gm-mg-unit Chew (Inulin-calcium-vitamin d) .... Takes 2 by mouth once daily 5)  Fish Oil 1000 Mg Caps (Omega-3 fatty acids) .... 2 grams daily 6)  Aspir-low 81 Mg Tbec (Aspirin) .... Once daily 7)  Cymbalta 60 Mg Cpep (Duloxetine hcl) .... Take 1 tablet by mouth once a day 8)  Pravastatin Sodium 40 Mg Tabs (Pravastatin sodium) .... Take 1 tablet by mouth once a day  Patient Instructions: 1)  Start B12 1000 mg daily for fatigue. 2)   Start exercsie routine. 3)  Consider trial of miralax for constipation.  4)   Start pravastain daily. 5)  Recheck fasting LIPIDS, AST, ALT  in 3 months Dx 272.0    6)  Schedule CPX in 06/2009 Prescriptions: CYMBALTA 60 MG CPEP (DULOXETINE HCL) Take 1 tablet by mouth once a day  #30 x 5   Entered and Authorized by:   Kerby Nora MD   Signed by:   Kerby Nora MD on 02/09/2010   Method used:   Electronically to        [facility] [address] (retail)  [address]       [place], [place]  [address]       Ph: [phone]       Fax: [phone]   RxID:   [id number] PRAVASTATIN SODIUM 40 MG TABS (PRAVASTATIN SODIUM) Take 1 tablet by mouth once a day  #30 x 11   Entered and Authorized by:   Kerby Nora MD   Signed by:   Kerby Nora MD on 02/09/2010   Method used:   Electronically to        [facility] [address] (retail)       [address]       [place], [place]  [address]       Ph: [phone]       Fax: [phone]   RxID:   [id number]   Current Allergies (reviewed today): ! DARVON ! CODEINE ! LEVAQUIN

## 2010-06-30 NOTE — Assessment & Plan Note (Signed)
Summary: SINUS PROBLEMS/EVM   Vital Signs:  Patient profile:   55 year old female Height:      62 inches Weight:      145 pounds Temp:     98.9 degrees F oral Pulse rate:   92 / minute Pulse rhythm:   regular Resp:     20 per minute BP sitting:   118 / 80  (left arm) Cuff size:   regular  Vitals Entered By: Providence Crosby LPN (January 08, 2010 11:17 AM) Physical Exam General appearance: Well-developed,well-nourished,in no acute distress; alert,appropriate and cooperative throughout examination Neck: No deformities, masses, or tenderness noted. Extremities: No clubbing, cyanosis, edema, or deformity noted with normal full range of motion of all joints.   Skin: Intact without suspicious lesions or rashes   Allergies: 1)  ! Darvon 2)  ! Codeine 3)  ! Levaquin  Family History: father: CAD, MI age 50,DM, HTN, high chol mother: CAD, Mi age 16s,  DM, high chol, HTN sister: Diabetes Mellitus MGM:DM PGM: DM  Social History: Occupation: Consulting civil engineer Divorced 2 children: healthy Never Smoked or used tobacco products Alcohol use-yes, 1 drink every few months Drug use-no Regular exercise-yes,  walk Diet: skip meals, food on the run History of Present Illness Referral source: Return Visit History from: patient Reason for visit: sinus pain Chief Complaint: cough, sinus pressure and drainage History of Present Illness: complains of sinus pressure and pain hoarseness sore throat cough and headaches, patient says that  symptoms have persisted for 7-10 days and continues to worsen.  The patient reports that she has had many sinus infections before and this feels like one.  Pt is not sleeping well because of postnasal drainage.    Current Meds POTASSIUM 99 MG TABS (POTASSIUM) Takes 2 tablets by mouth once daily NADOLOL 20 MG TABS (NADOLOL) Take 1 tablet by mouth once a day ALENDRONATE SODIUM 70 MG TABS (ALENDRONATE SODIUM) Take 1 tablet by mouth weekly FIBERCHOICE PLUS  CALCIUM 2-250-100 GM-MG-UNIT CHEW (INULIN-CALCIUM-VITAMIN D) Takes 2 by mouth once daily FISH OIL 1000 MG CAPS (OMEGA-3 FATTY ACIDS) 2 grams daily ASPIR-LOW 81 MG TBEC (ASPIRIN) once daily CYMBALTA 30 MG CPEP (DULOXETINE HCL) 1 tab by mouth daily x 1week, then increase to 2 tab by mouth daily  If working well call for refills at 60 mg dose. AZITHROMYCIN 1 GM PACK (AZITHROMYCIN) take z-pack as directed ROBITUSSIN MAXIMUM STRENGTH 15 MG/5ML SYRP (DEXTROMETHORPHAN HBR) take 1 tsp by mouth q 4-6 hours as needed cough, congestion    Review of Systems       The patient complains of hoarseness, prolonged cough, and headaches.  The patient denies fever, chest pain, syncope, dyspnea on exertion, and peripheral edema.         all other systems reviewed and negative. CLJ  Physical Exam  General:  Well-developed,well-nourished,in no acute distress; alert,appropriate and cooperative throughout examination Head:  Normocephalic and atraumatic without obvious abnormalities. No apparent alopecia or balding. Eyes:  No corneal or conjunctival inflammation noted. EOMI. Perrla. Funduscopic exam benign, without hemorrhages, exudates or papilledema. Vision grossly normal. Ears:  External ear exam shows no significant lesions or deformities.  Otoscopic examination reveals clear canals, tympanic membranes are intact bilaterally without bulging, retraction, inflammation or discharge. Hearing is grossly normal bilaterally. Nose:  nasal discharge mucosal pallor.  nasal dischargemucosal pallor, mucosal erythema, mucosal edema, mucosal friability, and R frontal sinus tenderness.  nasal dischargemucosal pallor, mucosal erythema, mucosal edema, mucosal friability, and R frontal sinus tenderness.  Mouth:  pharynx pink and moist, mild erythema, and fair dentition.  pharynx pink and moist and no erythema.   Neck:  No deformities, masses, or tenderness noted. Chest Wall:  No deformities, masses, or tenderness noted. Lungs:   Normal respiratory effort, chest expands symmetrically. Lungs are fairly clear to auscultation,mild upper airway congestion noises heard,  no crackles or wheezes. Heart:  Normal rate and regular rhythm. S1 and S2 normal without gallop, murmur, click, rub or other extra sounds. Abdomen:  Bowel sounds positive,abdomen soft and non-tender without masses, organomegaly or hernias noted. Extremities:  No clubbing, cyanosis, edema, or deformity noted with normal full range of motion of all joints.   Skin:  Intact without suspicious lesions or rashes Cervical Nodes:  No lymphadenopathy noted Psych:  Cognition and judgment appear intact. Alert and cooperative with normal attention span and concentration. No apparent delusions, illusions, hallucinations   Complete Medication List: 1)  Potassium 99 Mg Tabs (Potassium) .... Takes 2 tablets by mouth once daily 2)  Nadolol 20 Mg Tabs (Nadolol) .... Take 1 tablet by mouth once a day 3)  Alendronate Sodium 70 Mg Tabs (Alendronate sodium) .... Take 1 tablet by mouth weekly 4)  Fiberchoice Plus Calcium 2-250-100 Gm-mg-unit Chew (Inulin-calcium-vitamin d) .... Takes 2 by mouth once daily 5)  Fish Oil 1000 Mg Caps (Omega-3 fatty acids) .... 2 grams daily 6)  Aspir-low 81 Mg Tbec (Aspirin) .... Once daily 7)  Cymbalta 30 Mg Cpep (Duloxetine hcl) .Marland Kitchen.. 1 tab by mouth daily x 1week, then increase to 2 tab by mouth daily  if working well call for refills at 60 mg dose. 8)  Azithromycin 1 Gm Pack (Azithromycin) .... Take z-pack as directed 9)  Robitussin Maximum Strength 15 Mg/80ml Syrp (Dextromethorphan hbr) .... Take 1 tsp by mouth q 4-6 hours as needed cough, congestion  Patient Instructions: 1)  The risks, benefits and possible side effects were clearly explained and discussed with the patient.  The patient verbalized clear understanding.  The patient was given instructions to return if symptoms don't improve, worsen or new changes develop.  If it is not during clinic  hours and the patient cannot get back to this clinic then the patient was told to seek medical care at an available urgent care or emergency department.  The patient verbalized understanding.   2)  The patient was informed that there is no on-call provider or services available at this clinic during off-hours (when the clinic is closed).  If the patient developed a problem or concern that required immediate attention, the patient was advised to go the the nearest available urgent care or emergency department for medical care.  The patient verbalized understanding.    3)  It was clearly explained to the patient that this [facility] is not intended to be a primary care clinic.  The patient is always better served by the continuity of care and the provider/patient relationships developed with their dedicated primary care provider.  The patient was told to be sure to follow up as soon as possible with their primary care provider to discuss treatments received and to receive further examination and testing.  The patient verbalized understanding. The will f/u with PCP ASAP.  Prescriptions: ROBITUSSIN MAXIMUM STRENGTH 15 MG/5ML SYRP (DEXTROMETHORPHAN HBR) take 1 tsp by mouth q 4-6 hours as needed cough, congestion  #60 ml x 0   Entered and Authorized by:   Standley Dakins MD   Signed by:   Standley Dakins MD on 01/08/2010  Method used:   Electronically to        [facility] [address] (retail)       [address], [address]       [place], [place]  [address]       Ph: [phone]       Fax: [phone]   RxID:   [id number] AZITHROMYCIN 1 GM PACK (AZITHROMYCIN) take z-pack as directed  #1 x 1   Entered and Authorized by:   Standley Dakins MD   Signed by:   Standley Dakins MD on 01/08/2010   Method used:   Electronically to        [facility] [address] (retail)       [address], [address]       [place], [place]  [address]        Ph: [phone]       Fax: [phone]   RxID:   [id number]

## 2010-06-30 NOTE — Assessment & Plan Note (Signed)
Summary: CPX/R/S FROM 06/10/09/[facility]   Vital Signs:  Patient profile:   55 year old female Height:      62.25 inches Weight:      141.8 pounds BMI:     25.82 Temp:     98.3 degrees F oral Pulse rate:   72 / minute Pulse rhythm:   regular BP sitting:   110 / 70  (left arm) Cuff size:   regular  Vitals Entered By: Benny Lennert CMA Duncan Dull) (July 16, 2009 9:28 AM)  History of Present Illness: Chief complaint cpx  Anxiety/depression, not well controlled. Anhedonia, lack of motivation. ies to work, comes home and goes to sleep. Insomnia, wakes up at night alot. GERD...wakes her up at night some.Marland Kitchenoccurs daily. Trying OTC prilosec 20 mg daily x several months.   On celexa 20 mg daily x 3 years...not helping much now.   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 Take 1 Tablet By Mouth Once A Day 2)  Potassium 99 Mg Tabs (Potassium) .... Takes 2 Tablets By Mouth Once Daily 3)  Hydrocodone-Acetaminophen 5-325 Mg Tabs (Hydrocodone-Acetaminophen) .... Take 1 Tablet By Mouth Two Times A Day As Needed 4)  Nadolol 20 Mg Tabs (Nadolol) .... Take 1 Tablet By Mouth Once A Day 5)  Alendronate Sodium 70 Mg Tabs (Alendronate Sodium) .... Take 1 Tablet By Mouth Weekly 6)  Fiberchoice Plus Calcium 2-250-100 Gm-Mg-Unit Chew (Inulin-Calcium-Vitamin D)  .... Takes 2 By Mouth Once Daily 7)  Promethazine Hcl 25 Mg  Tabs (Promethazine Hcl) .Marland Kitchen.. 1 Tab Every 6 Hours As Needed For Nausea. 8)  Cyclobenzaprine Hcl 10 Mg Tabs (Cyclobenzaprine Hcl) .... Take One Tab Once Daily 9)  Fish Oil 1000 Mg Caps (Omega-3 Fatty Acids) .... 2 Grams Daily 10)  Aspir-Low 81 Mg Tbec (Aspirin) .... Once Daily  Allergies: 1)  ! Darvon 2)  ! Codeine 3)  ! Levaquin  Past History:  Past medical, surgical, family and social histories (including risk factors) reviewed, and no changes noted (except as noted below).  Past Medical History: Reviewed history from 12/11/2008 and no changes required. GERD Allergic rhinitis  Past Surgical History: Reviewed history from 12/11/2008 and no changes required. Lithotripsy 06/2008..................................................Marland KitchenDr. Annabell Howells shoulder surgery 1995..bone spur.........................Marland KitchenDr. Rennis Chris in [place] [facility] Back surgery x 2 2001/2000, for herniated disc.....Dr. Krista Blue in [place] 1972 breast bx: nml  Appendectomy Tonsillectomy Hysterectomy, partial , both ovaries remain...for endometriosis, no CA............Marland KitchenDr. Barnabas Lister  Family History: Reviewed history from 12/11/2008 and no changes required. father: CAD, MI age 66,DM, HTN, high chol mother: CAD, Mi age 36s,  DM, high chol, HTN sister: DM MGM:DM PGM: DM  Social History: Reviewed history from 12/11/2008 and no changes required. 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 CV:  Denies chest pain or discomfort. Resp:  Denies shortness of breath. GI:  Denies abdominal pain, bloody stools, constipation, and diarrhea; some burning in epigastrum . GU:  Denies  dysuria. Derm:  Denies lesion(s). Psych:  Complains of anxiety and irritability; denies depression, easily tearful, and suicidal thoughts/plans.  Physical Exam  General:  overweight female in NAD Eyes:  No corneal or conjunctival inflammation noted. EOMI. Perrla. Funduscopic exam benign, without hemorrhages, exudates or papilledema. Vision grossly normal. Ears:  External ear exam shows no significant lesions or deformities.  Otoscopic examination reveals clear canals, tympanic membranes are intact bilaterally without bulging, retraction, inflammation or discharge. Hearing is grossly normal bilaterally. Nose:  External nasal examination shows no deformity or inflammation. Nasal mucosa are pink and moist without lesions or exudates. Mouth:  MMM, denture Neck:  no carotid bruit or thyromegaly, no cervical or supraclavicular lymphadenopathy   Chest Wall:  No deformities, masses, or tenderness noted. Breasts:  No mass, nodules, thickening, tenderness, bulging, retraction, inflamation, nipple discharge or skin changes noted.   Right smaller than left..stable per pyt.  Lungs:  Normal respiratory effort, chest expands symmetrically. Lungs are clear to auscultation, no crackles or wheezes. Heart:  Normal rate and regular rhythm. S1 and S2 normal without gallop, murmur, click, rub or other extra sounds. Abdomen:  Bowel sounds positive,abdomen soft and non-tender without masses, organomegaly or hernias noted. Genitalia:  Pelvic Exam:        External: normal female genitalia without lesions or masses        Vagina: normal without lesions or masses        Cervix: none         Adnexa: normal bimanual exam without masses or fullness        Uterus: none        Pap smear: not performed Pulses:  R and L posterior tibial pulses are full and equal bilaterally  Extremities:  no edema Skin:  Intact without suspicious lesions or rashes Psych:  Cognition and judgment appear intact. Alert and cooperative with  normal attention span and concentration. No apparent delusions, illusions, hallucinations   Impression & Recommendations:  Problem # 1:  Preventive Health Care (ICD-V70.0) The patient's preventative maintenance and recommended screening tests for an annual wellness exam were reviewed in full today. Brought up to date unless services declined.  Counselled on the importance of diet, exercise, and its role in overall health and mortality. The patient's FH and SH was reviewed, including their home life, tobacco status, and drug and alcohol status.     Problem # 2:  Gynecological examination-routine (ICD-V72.31) DVE no pap.   Problem # 3:  ANXIETY DEPRESSION (ICD-300.4) Poor control..likely contributing to fatigue and insomnia. Increase celexa to 40 mg daily.   Problem # 4:  GERD (ICD-530.81)  Poor control. Increase prilosec to 40 mg daily..call if not imrproving in next few weeks. Diet changes reviewed as well.   Discussed lifestyle modifications, diet, antacids/medications, and preventive measures. Handout provided.   Problem # 5:  HYPERLIPIDEMIA (ICD-272.4) Assessment: New Counseled on diet changes, exercise. Info given.  increase fish oil to active ingredient omegas = 2000 mg daily. Labs Reviewed: SGOT: 24 (06/29/2009)   SGPT: 16 (06/29/2009)   HDL:42.30 (06/29/2009)  LDL:167 (06/29/2009), 167 (06/29/2009)  Chol:269 (06/29/2009)  Trig:258.0 (06/29/2009)  Complete Medication List: 1)  Citalopram Hydrobromide 40 Mg Tabs (Citalopram hydrobromide) .Marland Kitchen.. 1 tab by mouth daily 2)  Potassium 99 Mg Tabs (  Potassium) .... Takes 2 tablets by mouth once daily 3)  Hydrocodone-acetaminophen 5-325 Mg Tabs (Hydrocodone-acetaminophen) .... Take 1 tablet by mouth two times a day as needed 4)  Nadolol 20 Mg Tabs (Nadolol) .... Take 1 tablet by mouth once a day 5)  Alendronate Sodium 70 Mg Tabs (Alendronate sodium) .... Take 1 tablet by mouth weekly 6)  Fiberchoice Plus Calcium 2-250-100 Gm-mg-unit  Chew (Inulin-calcium-vitamin d) .... Takes 2 by mouth once daily 7)  Promethazine Hcl 25 Mg Tabs (Promethazine hcl) .Marland Kitchen.. 1 tab every 6 hours as needed for nausea. 8)  Cyclobenzaprine Hcl 10 Mg Tabs (Cyclobenzaprine hcl) .... Take one tab once daily 9)  Fish Oil 1000 Mg Caps (Omega-3 fatty acids) .... 2 grams daily 10)  Aspir-low 81 Mg Tbec (Aspirin) .... Once daily  Other Orders: Radiology Referral (Radiology)  Patient Instructions: 1)  Referral Appointment Information 2)  Day/Date: 3)  Time: 4)  Place/MD: 5)  Address: 6)  Phone/Fax: 7)  Patient given appointment information. Information/Orders faxed/mailed.  8)  Change to skim milk.  9)  Change diet to lower cholesterol. 10)  Start exercsie regualrly 2-3 times per week.  11)  Continue fish oil. 12)  Increase prilosec to 2 tabs of 20 mg daily..call if not imrpoving reflux in 2 weeks.  13)  Recheck fasting lipids iin 3 months Dx 272.0    Prescriptions: NADOLOL 20 MG TABS (NADOLOL) Take 1 tablet by mouth once a day  #90 x 3   Entered and Authorized by:   Kerby Nora MD   Signed by:   Kerby Nora MD on 07/16/2009   Method used:   Electronically to        [facility] [address] (retail)       [address]       [place], [place]  [address]       Ph: [phone]       Fax: [phone]   RxID:   [id number] CITALOPRAM HYDROBROMIDE 40 MG TABS (CITALOPRAM HYDROBROMIDE) 1 tab by mouth daily  #30 x 11   Entered and Authorized by:   Kerby Nora MD   Signed by:   Kerby Nora MD on 07/16/2009   Method used:   Electronically to        [facility] [address] (retail)       [address]       [place], [place]  [address]       Ph: [phone]       Fax: [phone]   RxID:   [id number]   Current Allergies (reviewed today): ! DARVON ! CODEINE ! LEVAQUIN  Flu Vaccine Result Date:  01/27/2009 Flu Vaccine Result:  given Flu Vaccine Next Due:  1  yr LDL Result Date:  06/29/2009 LDL Result:  167 LDL Next Due:  1 yr PAP Result Date:  07/16/2009 PAP Result:  DVE every 1-3 years, both ovaries remain, no cervix, uterus PAP Next Due:  3 yr     Past Medical History:    Reviewed history from 12/11/2008 and no changes required:       GERD       Allergic rhinitis  Past Surgical History:    Reviewed history from 12/11/2008 and no changes required:       Lithotripsy 06/2008..................................................Marland KitchenDr. Annabell Howells       shoulder surgery 1995..bone spur.........................Marland KitchenDr. Rennis Chris in [place]  [facility]       Back surgery x 2 2001/2000, for herniated disc.....Dr. Krista Blue in [place]       1972 breast bx: nml        Appendectomy       Tonsillectomy       Hysterectomy, partial , both ovaries remain...for endometriosis, no CA............Marland KitchenDr. Barnabas Lister

## 2010-07-05 ENCOUNTER — Ambulatory Visit (HOSPITAL_COMMUNITY)
Admission: RE | Admit: 2010-07-05 | Discharge: 2010-07-05 | Disposition: A | Discharge status: Home / Self Care | Payer: Commercial Managed Care - PPO | Source: Ambulatory Visit | Attending: Neurological Surgery | Admitting: Neurological Surgery

## 2010-07-05 DIAGNOSIS — IMO0001 Reserved for inherently not codable concepts without codable children: Secondary | ICD-10-CM | POA: Insufficient documentation

## 2010-07-05 DIAGNOSIS — M545 Low back pain, unspecified: Secondary | ICD-10-CM | POA: Insufficient documentation

## 2010-07-05 DIAGNOSIS — M6281 Muscle weakness (generalized): Secondary | ICD-10-CM | POA: Insufficient documentation

## 2010-07-06 NOTE — Letter (Signed)
Summary: [facility]  [facility]   Imported By: Sherian Rein 06/29/2010 07:47:15  _____________________________________________________________________  External Attachment:    Type:   Image     Comment:   External Document

## 2010-07-06 NOTE — Assessment & Plan Note (Signed)
Summary: cough,congestion, hoarse/jbb   Vital Signs:  Patient profile:   55 year old female Height:      62 inches Weight:      143 pounds BMI:     26.25 O2 Sat:      100 % on Room air Pulse rate:   92 / minute Resp:     16 per minute BP sitting:   106 / 75  (left arm)  Vitals Entered By: Adella Hare LPN (June 25, 2010 12:05 PM)  O2 Flow:  Room air  CC: cough, congestion  x 1 week Is Patient Diabetic? No  History of Present Illness Chief Complaint: cough, congestion  x 1 week History of Present Illness: Patient is a 36 WF was seen in the ED onThursdasy xray were negative for pneumonia and sher was placed on Tussin despite the tussin she has not improved and has gotten worse w/increase cughing and congcongestion. SHe has noted bronchospasm and does not have an inhaler to use.     Current Medications (verified): 1)  Potassium 99 Mg Tabs (Potassium) .... Takes 2 Tablets By Mouth Once Daily 2)  Nadolol 20 Mg Tabs (Nadolol) .... Take 1 Tablet By Mouth Once A Day 3)  Alendronate Sodium 70 Mg Tabs (Alendronate Sodium) .... Take 1 Tablet By Mouth Weekly 4)  Fiberchoice Plus Calcium 2-250-100 Gm-Mg-Unit Chew (Inulin-Calcium-Vitamin D) .... Takes 2 By Mouth Once Daily 5)  Fish Oil 1000 Mg Caps (Omega-3 Fatty Acids) .... 2 Grams Daily 6)  Aspir-Low 81 Mg Tbec (Aspirin) .... Once Daily 7)  Cymbalta 60 Mg Cpep (Duloxetine Hcl) .... Take 1 Tablet By Mouth Once A Day 8)  Pravastatin Sodium 40 Mg Tabs (Pravastatin Sodium) .... Take 1 Tablet By Mouth Once A Day 9)  Pantoprazole Sodium 40 Mg Tbec (Pantoprazole Sodium) .Marland Kitchen.. 1 Tab By Mouth Daily  Allergies (verified): 1)  ! Darvon 2)  ! Codeine 3)  ! Levaquin  Past History:  Past Medical History: Last updated: 12/11/2008 GERD Allergic rhinitis  Past Surgical History: Last updated: 12/11/2008 Lithotripsy 06/2008..................................................Marland KitchenDr. Annabell Howells shoulder surgery 1995..bone spur.........................Marland KitchenDr.  Rennis Chris in [place] [facility] Back surgery x 2 2001/2000, for herniated disc.....Dr. Krista Blue in [place] 1972 breast bx: nml  Appendectomy Tonsillectomy Hysterectomy, partial , both ovaries remain...for endometriosis, no CA............Marland KitchenDr. 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 father: CAD, MI age 13,DM, HTN, high chol mother: CAD, Mi age 66s,  DM, high chol, HTN sister: Diabetes Mellitus MGM:DM PGM: DM  Social History: Reviewed history from 01/08/2010 and no changes required. 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   Head:  normocephalic.   Neck:  supple and no masses.   Chest Wall:  no deformities.   Lungs:  R wheezes and L wheezes.   Heart:  normal  rate and regular rhythm.   Msk:  normal ROM.   Neurologic:  alert & oriented X3 and cranial nerves II-XII intact.     Complete Medication List: 1)  Potassium 99 Mg Tabs (Potassium) .... Takes 2 tablets by mouth once daily 2)  Nadolol 20 Mg Tabs (Nadolol) .... Take 1 tablet by mouth once a day 3)  Alendronate Sodium 70 Mg Tabs (Alendronate sodium) .... Take 1 tablet by mouth weekly 4)  Fiberchoice  Plus Calcium 2-250-100 Gm-mg-unit Chew (Inulin-calcium-vitamin d) .... Takes 2 by mouth once daily 5)  Fish Oil 1000 Mg Caps (Omega-3 fatty acids) .... 2 grams daily 6)  Aspir-low 81 Mg Tbec (Aspirin) .... Once daily 7)  Cymbalta 60 Mg Cpep (Duloxetine hcl) .... Take 1 tablet by mouth once a day 8)  Pravastatin Sodium 40 Mg Tabs (Pravastatin sodium) .... Take 1 tablet by mouth once a day 9)  Pantoprazole Sodium 40 Mg Tbec (Pantoprazole sodium) .Marland Kitchen.. 1 tab by mouth daily 10)  Zithromax Z-pak 250 Mg Tabs (Azithromycin) .... Use as directed 11)  Tussionex Pennkinetic Er 8-10 Mg/72ml Lqcr (Chlorpheniramine-hydrocodone) .Marland Kitchen.. 1 tsp by mouth twice a day 12)  Proair Hfa 108 (90 Base) Mcg/act Aers (Albuterol sulfate) .... 2 inh q4h as needed shortness of breath  Other Orders: Nebulizer Tx (04540) Depo- Medrol 80mg  (J1040) Admin of Therapeutic Inj  intramuscular or subcutaneous (98119) Physical Exam General appearance: alert, well-developed, and well-nourished.   Neck: supple and no masses.   Neurological: alert & oriented X3 and cranial nerves II-XII intact.     Patient Instructions: 1)  Please schedule an appointment with your primary doctor in :7-143 days if not better 2)  Take your antibiotic as prescribed until ALL of it is gone, but stop if you develop a rash or swelling and contact our office as soon as possible. 3)  Acute bronchitis symptoms for less than 10 days are not helped by antibiotics. take over the counter cough medications. call if no improvment in  5-7 days, sooner if increasing cough, fever, or new symptoms( shortness of breath, chest pain). 4)  Recommended remaining out of work for  Assessment New Problems: BRONCHITIS, ACUTE WITH BRONCHOSPASM (ICD-466.0)  Bronchitis   Patient Education: Patient and/or caregiver instructed in the following: fluids.  Plan New Medications/Changes: ZITHROMAX Z-PAK 250 MG  TABS (AZITHROMYCIN) Use as directed  #1 x 0, 06/25/2010, Hassan Rowan  MD Sandria Senter ER 8-10 MG/5ML LQCR (CHLORPHENIRAMINE-HYDROCODONE) 1 tsp by mouth twice a day  #3fl oz x 0, 06/25/2010, Hassan Rowan MD PROAIR HFA 108 (90 BASE) MCG/ACT  AERS (ALBUTEROL SULFATE) 2 inh q4h as needed shortness of breath  #1 x 1, 06/25/2010, Hassan Rowan MD PROAIR HFA 108 (90 BASE) MCG/ACT  AERS (ALBUTEROL SULFATE) 2 inh q4h as needed shortness of breath  #1 x 1, 06/25/2010, Hassan Rowan MD TUSSIONEX PENNKINETIC ER 8-10 MG/5ML LQCR (CHLORPHENIRAMINE-HYDROCODONE) 1 tsp by mouth twice a day  #68fl oz x 0, 06/25/2010, Hassan Rowan MD ZITHROMAX Z-PAK 250 MG  TABS (AZITHROMYCIN) Use as directed  #1 x 0, 06/25/2010, Hassan Rowan MD  New Orders: New Patient Level III [phone] Nebulizer Tx [phone] Depo- Medrol 80mg  [J1040] Admin of Therapeutic Inj  intramuscular or subcutaneous [96372] Follow Up: Follow up in 2-3 days if no improvement, Follow up on an as needed basis, Follow up with Primary Physician Work/School Excuse: Return to work/school in 3 days  The patient and/or caregiver has been counseled thoroughly with regard to medications prescribed including dosage, schedule, interactions,  rationale for use, and possible side effects and they verbalize understanding.  Diagnoses and expected course of recovery discussed and will return if not improved as expected or if the condition worsens. Patient and/or caregiver verbalized understanding.   Prescriptions: ZITHROMAX Z-PAK 250 MG  TABS (AZITHROMYCIN) Use as directed  #1 x 0   Entered and Authorized by:   Hassan Rowan MD   Signed by:   Hassan Rowan MD on 06/25/2010   Method used:   Printed then faxed to ...       [facility] [address] (retail)       [address]       [place], [place]  [address]       Ph: [phone]       Fax: [phone]   RxID:   [id number] Sandria Senter ER 8-10 MG/5ML LQCR (CHLORPHENIRAMINE-HYDROCODONE) 1 tsp by mouth twice a day  #84fl oz x 0   Entered and  Authorized by:   Hassan Rowan MD   Signed by:   Hassan Rowan MD on 06/25/2010   Method used:   Printed then faxed to ...       [facility] [address] ([facility]ail)       [address]       [place], [place]  [address]       Ph: [phone]       Fax: [phone]   RxID:   [id number] PROAIR HFA 108 (90 BASE) MCG/ACT  AERS (ALBUTEROL SULFATE) 2 inh q4h as needed shortness of breath  #1 x 1   Entered and Authorized by:   Hassan Rowan MD   Signed by:   Hassan Rowan MD on 06/25/2010   Method used:   Printed then faxed to ...       [facility] [address]ail)       [address], [address]       [place], [place]  [address]       Ph: [phone]       Fax: [phone]   RxID:   [id number] PROAIR HFA 108 (90 BASE) MCG/ACT  AERS (ALBUTEROL SULFATE) 2 inh q4h as needed shortness of breath  #1 x 1   Entered and Authorized by:   Hassan Rowan MD   Signed by:   Hassan Rowan MD on 06/25/2010   Method used:   Printed then faxed to ...       [facility] [address]ail)       [address]       [place], [place]  [address]       Ph: [phone]       Fax: [phone]   RxID:   [id number] Sandria Senter ER 8-10 MG/5ML LQCR (CHLORPHENIRAMINE-HYDROCODONE) 1 tsp by mouth twice a day  #61fl oz x 0   Entered and Authorized by:   Hassan Rowan MD   Signed by:   Hassan Rowan MD on 06/25/2010   Method used:   Printed then faxed to ...       [facility] [address] ([facility]ail)       [address]       [place], [place]  [address]  Ph: [phone]       Fax: [phone]   RxID:   [id number] ZITHROMAX Z-PAK 250 MG  TABS (AZITHROMYCIN) Use as directed  #1 x 0   Entered and Authorized by:   Hassan Rowan MD   Signed by:   Hassan Rowan MD on 06/25/2010   Method used:   Printed then faxed to ...       [facility] [address]  (retail)       [address]       [place], [place]  [address]       Ph: [phone]       Fax: [phone]   RxID:   [id number]    Medication Administration  Injection # 1:    Medication: Depo- Medrol 80mg     Diagnosis: BRONCHITIS, ACUTE WITH BRONCHOSPASM (ICD-466.0)    Route: IM    Site: RUOQ gluteus    Exp Date: 07/12    Lot #: Gunnar Bulla    Mfr: [employer]    Patient tolerated injection without complications    Given by: Adella Hare LPN (June 25, 2010 1:21 PM)  Medication # 1:    Medication: Atrovent 1mg  (Neb)  Medication # 2:    Medication: Albuterol Sulfate Sol 1mg  unit dose  Orders Added: 1)  New Patient Level III [99203] 2)  Nebulizer Tx [01601] 3)  Depo- Medrol 80mg  [J1040] 4)  Admin of Therapeutic Inj  intramuscular or subcutaneous [96372]     Medication Administration  Injection # 1:    Medication: Depo- Medrol 80mg     Diagnosis: BRONCHITIS, ACUTE WITH BRONCHOSPASM (ICD-466.0)    Route: IM    Site: RUOQ gluteus    Exp Date: 07/12    Lot #: Gunnar Bulla    Mfr: [employer]    Patient tolerated injection without complications    Given by: Adella Hare LPN (June 25, 2010 1:21 PM)  Medication # 1:    Medication: Atrovent 1mg  (Neb)  Medication # 2:    Medication: Albuterol Sulfate Sol 1mg  unit dose  Orders Added: 1)  New Patient Level III [99203] 2)  Nebulizer Tx [09323] 3)  Depo- Medrol 80mg  [J1040] 4)  Admin of Therapeutic Inj  intramuscular or subcutaneous [55732]

## 2010-07-07 ENCOUNTER — Encounter (HOSPITAL_COMMUNITY)
Admission: RE | Admit: 2010-07-07 | Discharge: 2010-07-07 | Disposition: A | Discharge status: Home / Self Care | Payer: 59 | Source: Ambulatory Visit | Attending: Family Medicine | Admitting: Family Medicine

## 2010-07-07 DIAGNOSIS — M810 Age-related osteoporosis without current pathological fracture: Secondary | ICD-10-CM | POA: Insufficient documentation

## 2010-07-07 DIAGNOSIS — IMO0001 Reserved for inherently not codable concepts without codable children: Secondary | ICD-10-CM | POA: Insufficient documentation

## 2010-07-07 DIAGNOSIS — M545 Low back pain, unspecified: Secondary | ICD-10-CM | POA: Insufficient documentation

## 2010-07-07 DIAGNOSIS — IMO0002 Reserved for concepts with insufficient information to code with codable children: Secondary | ICD-10-CM | POA: Insufficient documentation

## 2010-07-12 ENCOUNTER — Ambulatory Visit (HOSPITAL_COMMUNITY)
Admission: RE | Admit: 2010-07-12 | Discharge: 2010-07-12 | Disposition: A | Discharge status: Home / Self Care | Payer: 59 | Source: Ambulatory Visit | Attending: Family Medicine | Admitting: Family Medicine

## 2010-07-12 DIAGNOSIS — IMO0001 Reserved for inherently not codable concepts without codable children: Secondary | ICD-10-CM | POA: Insufficient documentation

## 2010-07-14 ENCOUNTER — Encounter: Payer: Self-pay | Admitting: Family Medicine

## 2010-07-14 NOTE — Letter (Signed)
Summary: Historic Patient File  Historic Patient File   Imported By: Kassie Mends 07/05/2010 10:22:09  _____________________________________________________________________  External Attachment:    Type:   Image     Comment:   External Document

## 2010-07-25 ENCOUNTER — Encounter: Payer: Self-pay | Admitting: Family Medicine

## 2010-07-26 ENCOUNTER — Encounter: Payer: Self-pay | Admitting: Family Medicine

## 2010-07-29 ENCOUNTER — Other Ambulatory Visit (HOSPITAL_COMMUNITY): Payer: Self-pay | Admitting: Neurological Surgery

## 2010-07-29 DIAGNOSIS — M545 Low back pain, unspecified: Secondary | ICD-10-CM

## 2010-07-29 DIAGNOSIS — M5416 Radiculopathy, lumbar region: Secondary | ICD-10-CM

## 2010-08-02 ENCOUNTER — Ambulatory Visit (HOSPITAL_COMMUNITY)
Admission: RE | Admit: 2010-08-02 | Discharge: 2010-08-02 | Disposition: A | Discharge status: Home / Self Care | Payer: Commercial Managed Care - PPO | Source: Ambulatory Visit | Attending: Neurological Surgery | Admitting: Neurological Surgery

## 2010-08-02 DIAGNOSIS — M5416 Radiculopathy, lumbar region: Secondary | ICD-10-CM

## 2010-08-02 DIAGNOSIS — M5126 Other intervertebral disc displacement, lumbar region: Secondary | ICD-10-CM | POA: Insufficient documentation

## 2010-08-02 DIAGNOSIS — M545 Low back pain, unspecified: Secondary | ICD-10-CM | POA: Insufficient documentation

## 2010-08-02 DIAGNOSIS — M79609 Pain in unspecified limb: Secondary | ICD-10-CM | POA: Insufficient documentation

## 2010-08-02 DIAGNOSIS — M5380 Other specified dorsopathies, site unspecified: Secondary | ICD-10-CM | POA: Insufficient documentation

## 2010-08-02 MED ORDER — GADOBENATE DIMEGLUMINE 529 MG/ML IV SOLN: 13.0000 mL | Freq: Once | INTRAVENOUS | Status: AC | PRN

## 2010-08-04 NOTE — Letter (Signed)
Summary: [facility]  [facility]   Imported By: Kassie Mends 07/27/2010 08:21:13  _____________________________________________________________________  External Attachment:    Type:   Image     Comment:   External Document

## 2010-08-11 ENCOUNTER — Other Ambulatory Visit (HOSPITAL_COMMUNITY): Payer: Self-pay | Admitting: Internal Medicine

## 2010-08-11 DIAGNOSIS — Z139 Encounter for screening, unspecified: Secondary | ICD-10-CM

## 2010-09-09 ENCOUNTER — Ambulatory Visit (HOSPITAL_COMMUNITY)
Admission: RE | Admit: 2010-09-09 | Discharge: 2010-09-09 | Disposition: A | Discharge status: Home / Self Care | Payer: 59 | Source: Ambulatory Visit | Attending: Internal Medicine | Admitting: Internal Medicine

## 2010-09-09 DIAGNOSIS — Z139 Encounter for screening, unspecified: Secondary | ICD-10-CM

## 2010-09-09 DIAGNOSIS — Z1231 Encounter for screening mammogram for malignant neoplasm of breast: Secondary | ICD-10-CM | POA: Insufficient documentation

## 2010-09-12 ENCOUNTER — Ambulatory Visit (HOSPITAL_COMMUNITY): Payer: Commercial Managed Care - PPO

## 2011-01-26 ENCOUNTER — Emergency Department (HOSPITAL_COMMUNITY)
Admission: EM | Admit: 2011-01-26 | Discharge: 2011-01-26 | Disposition: A | Discharge status: Home / Self Care | Payer: PRIVATE HEALTH INSURANCE | Attending: Emergency Medicine | Admitting: Emergency Medicine

## 2011-01-26 ENCOUNTER — Encounter: Payer: Self-pay | Admitting: Emergency Medicine

## 2011-01-26 DIAGNOSIS — S20229A Contusion of unspecified back wall of thorax, initial encounter: Secondary | ICD-10-CM | POA: Insufficient documentation

## 2011-01-26 DIAGNOSIS — Y9269 Other specified industrial and construction area as the place of occurrence of the external cause: Secondary | ICD-10-CM | POA: Insufficient documentation

## 2011-01-26 DIAGNOSIS — IMO0002 Reserved for concepts with insufficient information to code with codable children: Secondary | ICD-10-CM | POA: Insufficient documentation

## 2011-01-26 DIAGNOSIS — M5432 Sciatica, left side: Secondary | ICD-10-CM

## 2011-01-26 HISTORY — DX: Cardiac arrhythmia, unspecified: I49.9

## 2011-01-26 MED ORDER — DEXAMETHASONE SODIUM PHOSPHATE 4 MG/ML IJ SOLN
10.0000 mg | Freq: Once | INTRAMUSCULAR | Status: AC
  Administered 2011-01-26: 10 mg via INTRAVENOUS
  Filled 2011-01-26: qty 1
  Filled 2011-01-26: qty 2

## 2011-01-26 MED ORDER — CYCLOBENZAPRINE HCL 10 MG PO TABS: 10.0000 mg | ORAL_TABLET | Freq: Two times a day (BID) | ORAL | Status: AC | PRN

## 2011-01-26 MED ORDER — NAPROXEN 500 MG PO TABS: 500.0000 mg | ORAL_TABLET | Freq: Two times a day (BID) | ORAL | Status: AC

## 2011-01-26 MED ORDER — KETOROLAC TROMETHAMINE 60 MG/2ML IM SOLN
60.0000 mg | Freq: Once | INTRAMUSCULAR | Status: AC
  Administered 2011-01-26: 60 mg via INTRAMUSCULAR
  Filled 2011-01-26: qty 2
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
## 2011-11-09 ENCOUNTER — Other Ambulatory Visit (HOSPITAL_COMMUNITY): Payer: Self-pay | Admitting: Internal Medicine

## 2011-11-09 DIAGNOSIS — Z139 Encounter for screening, unspecified: Secondary | ICD-10-CM

## 2011-11-14 ENCOUNTER — Ambulatory Visit (HOSPITAL_COMMUNITY)
Admission: RE | Admit: 2011-11-14 | Discharge: 2011-11-14 | Disposition: A | Discharge status: Home / Self Care | Payer: 59 | Source: Ambulatory Visit | Attending: Internal Medicine | Admitting: Internal Medicine

## 2011-11-14 DIAGNOSIS — Z139 Encounter for screening, unspecified: Secondary | ICD-10-CM

## 2011-11-14 DIAGNOSIS — Z1231 Encounter for screening mammogram for malignant neoplasm of breast: Secondary | ICD-10-CM | POA: Insufficient documentation

## 2011-12-25 ENCOUNTER — Other Ambulatory Visit (HOSPITAL_COMMUNITY): Payer: Self-pay | Admitting: Internal Medicine

## 2011-12-25 ENCOUNTER — Ambulatory Visit (HOSPITAL_COMMUNITY)
Admission: RE | Admit: 2011-12-25 | Discharge: 2011-12-25 | Disposition: A | Discharge status: Home / Self Care | Payer: 59 | Source: Ambulatory Visit | Attending: Internal Medicine | Admitting: Internal Medicine

## 2011-12-25 DIAGNOSIS — M19049 Primary osteoarthritis, unspecified hand: Secondary | ICD-10-CM | POA: Insufficient documentation

## 2011-12-25 DIAGNOSIS — R52 Pain, unspecified: Secondary | ICD-10-CM

## 2011-12-25 DIAGNOSIS — M79609 Pain in unspecified limb: Secondary | ICD-10-CM | POA: Insufficient documentation

## 2012-07-13 ENCOUNTER — Other Ambulatory Visit: Payer: Self-pay

## 2012-08-05 ENCOUNTER — Other Ambulatory Visit (HOSPITAL_COMMUNITY): Payer: Self-pay | Admitting: Physical Medicine and Rehabilitation

## 2012-08-05 DIAGNOSIS — IMO0002 Reserved for concepts with insufficient information to code with codable children: Secondary | ICD-10-CM

## 2012-08-05 DIAGNOSIS — M961 Postlaminectomy syndrome, not elsewhere classified: Secondary | ICD-10-CM

## 2012-08-07 ENCOUNTER — Ambulatory Visit (HOSPITAL_COMMUNITY)
Admission: RE | Admit: 2012-08-07 | Discharge: 2012-08-07 | Disposition: A | Discharge status: Home / Self Care | Payer: 59 | Source: Ambulatory Visit | Attending: Physical Medicine and Rehabilitation | Admitting: Physical Medicine and Rehabilitation

## 2012-08-07 ENCOUNTER — Other Ambulatory Visit (HOSPITAL_COMMUNITY): Payer: Self-pay | Admitting: Physical Medicine and Rehabilitation

## 2012-08-07 DIAGNOSIS — M5137 Other intervertebral disc degeneration, lumbosacral region: Secondary | ICD-10-CM | POA: Insufficient documentation

## 2012-08-07 DIAGNOSIS — M961 Postlaminectomy syndrome, not elsewhere classified: Secondary | ICD-10-CM

## 2012-08-07 DIAGNOSIS — IMO0002 Reserved for concepts with insufficient information to code with codable children: Secondary | ICD-10-CM

## 2012-08-07 DIAGNOSIS — M47817 Spondylosis without myelopathy or radiculopathy, lumbosacral region: Secondary | ICD-10-CM | POA: Insufficient documentation

## 2012-08-07 DIAGNOSIS — M5126 Other intervertebral disc displacement, lumbar region: Secondary | ICD-10-CM | POA: Insufficient documentation

## 2012-08-07 DIAGNOSIS — M1610 Unilateral primary osteoarthritis, unspecified hip: Secondary | ICD-10-CM | POA: Insufficient documentation

## 2012-08-07 DIAGNOSIS — M545 Low back pain, unspecified: Secondary | ICD-10-CM | POA: Insufficient documentation

## 2012-08-07 DIAGNOSIS — M25559 Pain in unspecified hip: Secondary | ICD-10-CM | POA: Insufficient documentation

## 2012-08-07 DIAGNOSIS — M79609 Pain in unspecified limb: Secondary | ICD-10-CM | POA: Insufficient documentation

## 2012-08-07 DIAGNOSIS — M51379 Other intervertebral disc degeneration, lumbosacral region without mention of lumbar back pain or lower extremity pain: Secondary | ICD-10-CM | POA: Insufficient documentation

## 2012-08-07 DIAGNOSIS — M169 Osteoarthritis of hip, unspecified: Secondary | ICD-10-CM | POA: Insufficient documentation

## 2012-08-07 MED ORDER — GADOBENATE DIMEGLUMINE 529 MG/ML IV SOLN
11.0000 mL | Freq: Once | INTRAVENOUS | Status: AC | PRN
  Administered 2012-08-07: 11 mL via INTRAVENOUS

## 2012-08-08 ENCOUNTER — Ambulatory Visit (HOSPITAL_COMMUNITY): Payer: 59

## 2012-09-10 ENCOUNTER — Ambulatory Visit (INDEPENDENT_AMBULATORY_CARE_PROVIDER_SITE_OTHER): Payer: 59

## 2012-09-10 ENCOUNTER — Ambulatory Visit (INDEPENDENT_AMBULATORY_CARE_PROVIDER_SITE_OTHER): Payer: 59 | Admitting: Neurology

## 2012-09-10 DIAGNOSIS — IMO0002 Reserved for concepts with insufficient information to code with codable children: Secondary | ICD-10-CM

## 2012-09-10 DIAGNOSIS — M5137 Other intervertebral disc degeneration, lumbosacral region: Secondary | ICD-10-CM

## 2012-09-10 DIAGNOSIS — M545 Low back pain, unspecified: Secondary | ICD-10-CM

## 2012-09-10 DIAGNOSIS — Z0289 Encounter for other administrative examinations: Secondary | ICD-10-CM

## 2012-09-10 NOTE — Procedures (Signed)
57 year old Caucasian female, with bilateral lower extremity paresthesia, left worse than right, low back pain,  On examination, bilateral lower extremity motor and sensory examination was normal, deep tendon reflex was present and symmetric  Nerve Conduction studies:  Bilateral peroneal sensory responses were normal. Bilateral peroneal, and tibial motor responses were normal, bilateral tibial H. reflexes were normal and symmetric  Electromyography:  Selected needle examination was performed at left lower extremity muscles, and the left lumbosacral paraspinal muscles. Needle examination of left tibialis posterior, tibialis anterior, vastus lateralis, biceps femoris short head was normal There was no spontaneous activity at left lumbosacral paraspinal muscles, left L4, L5, S1  In conclusion:  This is a normal study, there was no electrodiagnostic evidence of large fiber neuropathy, or left lumbar radiculopathy

## 2012-11-04 ENCOUNTER — Other Ambulatory Visit (HOSPITAL_COMMUNITY): Payer: Self-pay | Admitting: Internal Medicine

## 2012-11-04 DIAGNOSIS — Z139 Encounter for screening, unspecified: Secondary | ICD-10-CM

## 2012-11-18 ENCOUNTER — Ambulatory Visit (HOSPITAL_COMMUNITY)
Admission: RE | Admit: 2012-11-18 | Discharge: 2012-11-18 | Disposition: A | Discharge status: Home / Self Care | Payer: 59 | Source: Ambulatory Visit | Attending: Internal Medicine | Admitting: Internal Medicine

## 2012-11-18 DIAGNOSIS — Z1231 Encounter for screening mammogram for malignant neoplasm of breast: Secondary | ICD-10-CM | POA: Insufficient documentation

## 2012-11-18 DIAGNOSIS — Z139 Encounter for screening, unspecified: Secondary | ICD-10-CM

## 2012-12-04 ENCOUNTER — Ambulatory Visit: Payer: Self-pay | Admitting: Pain Medicine

## 2012-12-04 LAB — CBC WITH DIFFERENTIAL/PLATELET
Basophil %: 0.3 %
Eosinophil %: 1.2 %
MCH: 33.4 pg (ref 26.0–34.0)
MCHC: 35.5 g/dL (ref 32.0–36.0)
MCV: 94 fL (ref 80–100)
Neutrophil #: 4.9 10*3/uL (ref 1.4–6.5)
Neutrophil %: 64.4 %
Platelet: 269 10*3/uL (ref 150–440)
RBC: 3.64 10*6/uL — ABNORMAL LOW (ref 3.80–5.20)
RDW: 12.5 % (ref 11.5–14.5)
WBC: 7.6 10*3/uL (ref 3.6–11.0)

## 2012-12-04 LAB — BASIC METABOLIC PANEL
BUN: 16 mg/dL (ref 7–18)
Chloride: 107 mmol/L (ref 98–107)
EGFR (African American): >60
Osmolality: 281 (ref 275–301)
Potassium: 3.8 mmol/L (ref 3.5–5.1)
Sodium: 140 mmol/L (ref 136–145)

## 2012-12-12 ENCOUNTER — Ambulatory Visit: Payer: Self-pay | Admitting: Pain Medicine

## 2012-12-23 ENCOUNTER — Ambulatory Visit: Payer: Self-pay | Admitting: Pain Medicine

## 2013-04-03 ENCOUNTER — Other Ambulatory Visit: Payer: Self-pay

## 2013-07-22 ENCOUNTER — Other Ambulatory Visit: Payer: 59 | Admitting: Adult Health

## 2013-07-31 ENCOUNTER — Ambulatory Visit (INDEPENDENT_AMBULATORY_CARE_PROVIDER_SITE_OTHER): Payer: 59 | Admitting: Adult Health

## 2013-07-31 ENCOUNTER — Encounter: Payer: Self-pay | Admitting: Adult Health

## 2013-07-31 VITALS — BP 120/80 | HR 76 | Ht 60.0 in | Wt 138.0 lb

## 2013-07-31 DIAGNOSIS — Z01419 Encounter for gynecological examination (general) (routine) without abnormal findings: Secondary | ICD-10-CM

## 2013-07-31 DIAGNOSIS — Z1212 Encounter for screening for malignant neoplasm of rectum: Secondary | ICD-10-CM

## 2013-07-31 LAB — HEMOCCULT GUIAC POC 1CARD (OFFICE): Fecal Occult Blood, POC: NEGATIVE

## 2013-07-31 MED ORDER — VALACYCLOVIR HCL 1 G PO TABS: 1000.0000 mg | ORAL_TABLET | Freq: Every day | ORAL | Status: DC
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
## 2013-07-31 NOTE — Patient Instructions (Signed)
Physical in 1 year Mammogram yearly Colonoscopy advised Labs with PCP 

## 2013-08-04 ENCOUNTER — Telehealth: Payer: Self-pay | Admitting: Gastroenterology

## 2013-08-04 MED ORDER — SOD PICOSULFATE-MAG OX-CIT ACD 10-3.5-12 MG-GM-GM PO PACK: PACK | ORAL | Status: DC

## 2013-08-04 NOTE — Telephone Encounter (Signed)
TRIAGE PT FOR SCREENING TCS IN APR 2015. PREPOPIK RX SENT.

## 2013-08-06 ENCOUNTER — Other Ambulatory Visit: Payer: Self-pay

## 2013-08-06 ENCOUNTER — Telehealth: Payer: Self-pay

## 2013-08-06 DIAGNOSIS — Z1211 Encounter for screening for malignant neoplasm of colon: Secondary | ICD-10-CM

## 2013-08-06 NOTE — Telephone Encounter (Signed)
See separate triage. Pt will need different prep.

## 2013-08-11 NOTE — Telephone Encounter (Signed)
Gastroenterology Pre-Procedure Review  Request Date:08/06/2013 Requesting Physician: Dr. Oneida Alar  PT HAS A NEURO STIMULATOR  PATIENT REVIEW QUESTIONS: The patient responded to the following health history questions as indicated:    1. Diabetes Melitis: no 2. Joint replacements in the past 12 months: no 3. Major health problems in the past 3 months: no 4. Has an artificial valve or MVP: no 5. Has a defibrillator: no 6. Has been advised in past to take antibiotics in advance of a procedure like teeth cleaning: no     MEDICATIONS & ALLERGIES:    Patient reports the following regarding taking any blood thinners:   Plavix? no Aspirin? YES Coumadin? no  Patient confirms/reports the following medications:  Current Outpatient Prescriptions  Medication Sig Dispense Refill  . aspirin EC 81 MG tablet Take 81 mg by mouth daily.        . DULoxetine (CYMBALTA) 60 MG capsule Take 120 mg by mouth daily.        . Multiple Vitamins-Minerals (ALIVE ONCE DAILY WOMENS 50+ PO) Take 1 tablet by mouth daily.        . nadolol (CORGARD) 20 MG tablet Take 20 mg by mouth daily.        . pantoprazole (PROTONIX) 40 MG tablet Take 40 mg by mouth daily.      . rosuvastatin (CRESTOR) 10 MG tablet Take 10 mg by mouth daily.      . valACYclovir (VALTREX) 1000 MG tablet Take 1 tablet (1,000 mg total) by mouth daily.  90 tablet  4  . Cyanocobalamin (VITAMIN B-12 PO) Take 1 tablet by mouth daily.        . Sod Picosulfate-Mag Ox-Cit Acd (Rico) 10-3.5-12 MG-GM-GM PACK USE AS DIRECTED  2 each  0   No current facility-administered medications for this visit.    Patient confirms/reports the following allergies:  Allergies  Allergen Reactions  . Codeine     REACTION: N \\T \ V  . Levofloxacin     REACTION: N \\T \ V  . Propoxyphene Hcl     REACTION: N \\T \ V    No orders of the defined types were placed in this encounter.    AUTHORIZATION INFORMATION Primary Insurance:   ID #:   Group #:  Pre-Cert / Auth  required:  Pre-Cert / Auth #:   Secondary Insurance:  ID #:   Group #:  Pre-Cert / Auth required:  Pre-Cert / Auth #:   SCHEDULE INFORMATION: Procedure has been scheduled as follows:  Date: 08/28/2013            Time:  8:30 AM Location: [facility]  This Gastroenterology Pre-Precedure Review Form is being routed to the following provider(s): Barney Drain, MD

## 2013-08-11 NOTE — Telephone Encounter (Signed)
PREPOPIK-DRINK WATER TO KEEP URINE LIGHT YELLOW. PT MAY HAVE A FULL LIQUID DIET THE DAY BEFORE HER COLONOSCOPY.  PT SHOULD DROP OFF RX 3 DAYS PRIOR TO PROCEDURE.

## 2013-08-14 NOTE — Telephone Encounter (Signed)
REVIEWED. SEND PT Moviprep COUPON.

## 2013-08-14 NOTE — Telephone Encounter (Signed)
T/C from [place] at [facility]. Pt requests a cheaper prep. Prepopik is $65.00 Movie Prep will be $25.00 and with coupon $15.00. ( I have a coupon to send to her).

## 2013-08-15 ENCOUNTER — Encounter (HOSPITAL_COMMUNITY): Payer: Self-pay | Admitting: Pharmacy Technician

## 2013-08-15 MED ORDER — PEG-KCL-NACL-NASULF-NA ASC-C 100 G PO SOLR: 1.0000 | ORAL | Status: DC

## 2013-08-15 NOTE — Telephone Encounter (Signed)
New Rx sent to the pharmacy and instructions mailed to pt with coupon.

## 2013-08-28 ENCOUNTER — Ambulatory Visit (HOSPITAL_COMMUNITY)
Admission: RE | Admit: 2013-08-28 | Discharge: 2013-08-28 | Disposition: A | Discharge status: Home / Self Care | Payer: 59 | Source: Ambulatory Visit | Attending: Gastroenterology | Admitting: Gastroenterology

## 2013-08-28 ENCOUNTER — Encounter (HOSPITAL_COMMUNITY)
Admission: RE | Disposition: A | Discharge status: Home / Self Care | Payer: Self-pay | Source: Ambulatory Visit | Attending: Gastroenterology

## 2013-08-28 ENCOUNTER — Encounter (HOSPITAL_COMMUNITY): Payer: Self-pay | Admitting: *Deleted

## 2013-08-28 DIAGNOSIS — F411 Generalized anxiety disorder: Secondary | ICD-10-CM | POA: Insufficient documentation

## 2013-08-28 DIAGNOSIS — Z7982 Long term (current) use of aspirin: Secondary | ICD-10-CM | POA: Insufficient documentation

## 2013-08-28 DIAGNOSIS — E785 Hyperlipidemia, unspecified: Secondary | ICD-10-CM | POA: Insufficient documentation

## 2013-08-28 DIAGNOSIS — Z1211 Encounter for screening for malignant neoplasm of colon: Secondary | ICD-10-CM | POA: Insufficient documentation

## 2013-08-28 DIAGNOSIS — Z79899 Other long term (current) drug therapy: Secondary | ICD-10-CM | POA: Insufficient documentation

## 2013-08-28 DIAGNOSIS — K648 Other hemorrhoids: Secondary | ICD-10-CM | POA: Insufficient documentation

## 2013-08-28 DIAGNOSIS — F329 Major depressive disorder, single episode, unspecified: Secondary | ICD-10-CM | POA: Insufficient documentation

## 2013-08-28 DIAGNOSIS — D128 Benign neoplasm of rectum: Secondary | ICD-10-CM | POA: Insufficient documentation

## 2013-08-28 DIAGNOSIS — F3289 Other specified depressive episodes: Secondary | ICD-10-CM | POA: Insufficient documentation

## 2013-08-28 DIAGNOSIS — D129 Benign neoplasm of anus and anal canal: Secondary | ICD-10-CM

## 2013-08-28 DIAGNOSIS — K219 Gastro-esophageal reflux disease without esophagitis: Secondary | ICD-10-CM | POA: Insufficient documentation

## 2013-08-28 HISTORY — DX: Major depressive disorder, single episode, unspecified: F32.9

## 2013-08-28 HISTORY — DX: Anxiety disorder, unspecified: F41.9

## 2013-08-28 HISTORY — PX: COLONOSCOPY: SHX5424

## 2013-08-28 HISTORY — DX: Depression, unspecified: F32.A

## 2013-08-28 SURGERY — COLONOSCOPY
Anesthesia: Moderate Sedation

## 2013-08-28 MED ORDER — MIDAZOLAM HCL 5 MG/5ML IJ SOLN
INTRAMUSCULAR | Status: DC | PRN
  Administered 2013-08-28 (×2): 2 mg via INTRAVENOUS

## 2013-08-28 MED ORDER — SODIUM CHLORIDE 0.9 % IV SOLN
INTRAVENOUS | Status: DC
  Administered 2013-08-28: 08:00:00 via INTRAVENOUS

## 2013-08-28 MED ORDER — SODIUM CHLORIDE 0.9 % IJ SOLN
INTRAMUSCULAR | Status: AC
  Filled 2013-08-28: qty 10

## 2013-08-28 MED ORDER — PROMETHAZINE HCL 25 MG/ML IJ SOLN
INTRAMUSCULAR | Status: AC
  Filled 2013-08-28: qty 1

## 2013-08-28 MED ORDER — MIDAZOLAM HCL 5 MG/5ML IJ SOLN
INTRAMUSCULAR | Status: AC
  Filled 2013-08-28: qty 10

## 2013-08-28 MED ORDER — MEPERIDINE HCL 100 MG/ML IJ SOLN
INTRAMUSCULAR | Status: DC | PRN
  Administered 2013-08-28: 50 mg via INTRAVENOUS

## 2013-08-28 MED ORDER — PROMETHAZINE HCL 25 MG/ML IJ SOLN
12.5000 mg | Freq: Once | INTRAMUSCULAR | Status: AC
  Administered 2013-08-28: 12.5 mg via INTRAVENOUS

## 2013-08-28 MED ORDER — STERILE WATER FOR IRRIGATION IR SOLN
Status: DC | PRN
  Administered 2013-08-28: 09:00:00

## 2013-08-28 MED ORDER — MEPERIDINE HCL 100 MG/ML IJ SOLN
INTRAMUSCULAR | Status: AC
  Filled 2013-08-28: qty 2

## 2013-08-28 NOTE — H&P (Signed)
Primary Care Physician:  Delphina Cahill, MD Primary Gastroenterologist:  Dr. Oneida Alar  Pre-Procedure History & Physical: HPI:  Wanda [name] is a 58 y.o. female here for [place].  Past Medical History  Diagnosis Date  . Irregular heart beat   . GERD (gastroesophageal reflux disease)   . Hyperlipidemia   . Herpes   . Depression   . Anxiety     Past Surgical History  Procedure Laterality Date  . Shoulder surgery    . Appendectomy    . Abdominal hysterectomy    . Back surgery      X 3  . Spinal cord stimulator      Prior to Admission medications   Medication Sig Start Date End Date Taking? Authorizing Provider  aspirin EC 81 MG tablet Take 81 mg by mouth daily.     Yes Historical Provider, MD  Cyanocobalamin (VITAMIN B-12 PO) Take 1 tablet by mouth daily.     Yes Historical Provider, MD  DULoxetine (CYMBALTA) 60 MG capsule Take 120 mg by mouth daily.     Yes Historical Provider, MD  methocarbamol (ROBAXIN) 500 MG tablet Take 500 mg by mouth 3 (three) times daily.   Yes Historical Provider, MD  Multiple Vitamins-Minerals (ALIVE ONCE DAILY WOMENS 50+ PO) Take 1 tablet by mouth daily.     Yes Historical Provider, MD  nadolol (CORGARD) 20 MG tablet Take 20 mg by mouth daily.     Yes Historical Provider, MD  pantoprazole (PROTONIX) 40 MG tablet Take 40 mg by mouth daily.   Yes Historical Provider, MD  rosuvastatin (CRESTOR) 10 MG tablet Take 10 mg by mouth daily.   Yes Historical Provider, MD  topiramate (TOPAMAX) 25 MG tablet Take 25 mg by mouth 2 (two) times daily.   Yes Historical Provider, MD  topiramate (TOPAMAX) 50 MG tablet Take 50 mg by mouth 2 (two) times daily.   Yes Historical Provider, MD  UNABLE TO FIND Place 1 patch onto the skin every 12 (twelve) hours. Flexor Patch   Yes Historical Provider, MD  valACYclovir (VALTREX) 1000 MG tablet Take 1 tablet (1,000 mg total) by mouth daily. 07/31/13  Yes Estill Dooms, NP    Allergies as of 08/06/2013 - Review  Complete 08/06/2013  Allergen Reaction Noted  . Codeine    . Levofloxacin    . Propoxyphene hcl      Family History  Problem Relation Age of Onset  . Diabetes Mother   . COPD Mother   . Heart disease Mother   . Diabetes Father   . COPD Father   . Heart disease Father   . Alcohol abuse Daughter   . Depression Daughter   . Tuberculosis Maternal Aunt   . Cancer Paternal Aunt     breast  . Diabetes Paternal Grandmother   . Diabetes Sister   . Cancer Cousin   . Diabetes Cousin   . Colon cancer Neg Hx     History   Social History  . Marital Status: Single    Spouse Name: N/A    Number of Children: N/A  . Years of Education: N/A   Occupational History  . Not on file.   Social History Main Topics  . Smoking status: Former Smoker -- 0.25 packs/day for 14 years    Types: Cigarettes  . Smokeless tobacco: Former Systems developer    Quit date: 08/29/1990  . Alcohol Use: Yes     Comment: occasional  . Drug Use: No  .  Sexual Activity: Yes    Birth Control/ Protection: Surgical   Other Topics Concern  . Not on file   Social History Narrative  . No narrative on file    Review of Systems: See HPI, otherwise negative ROS   Physical Exam: BP 122/79  Pulse 80  Temp(Src) 97.8 F (36.6 C) (Oral)  Resp 16  Ht 5' (1.524 m)  Wt 138 lb (62.596 kg)  BMI 26.95 kg/m2  SpO2 100% General:   Alert,  pleasant and cooperative in NAD Head:  Normocephalic and atraumatic. Neck:  Supple; Lungs:  Clear throughout to auscultation.    Heart:  Regular rate and rhythm. Abdomen:  Soft, nontender and nondistended. Normal bowel sounds, without guarding, and without rebound.   Neurologic:  Alert and  oriented x4;  grossly normal neurologically.  Impression/Plan:     SCREENING  Plan:  1. TCS TODAY

## 2013-08-28 NOTE — Op Note (Signed)
[facility] [address] [place], [address]   COLONOSCOPY PROCEDURE REPORT  PATIENT: Wanda [name], Wanda [name]  MR#: [id number] BIRTHDATE: [birth year] , 50  yrs. old GENDER: Female ENDOSCOPIST: Barney Drain, MD REFERRED DG:UYQI Hall, M.D. PROCEDURE DATE:  08/28/2013 PROCEDURE:   Colonoscopy with cold biopsy polypectomy INDICATIONS:Average risk patient for colon cancer. MEDICATIONS: PREOP: Promethazine (Phenergan) 12.5mg  IV, Demerol 50 mg IV, and Versed 4 mg IV  DESCRIPTION OF PROCEDURE:    Physical exam was performed.  Informed consent was obtained from the patient after explaining the benefits, risks, and alternatives to procedure.  The patient was connected to monitor and placed in left lateral position. Continuous oxygen was provided by nasal cannula and IV medicine administered through an indwelling cannula.  After administration of sedation and rectal exam, the patients rectum was intubated and the EC-3890Li ([id number])  colonoscope was advanced under direct visualization to the ileum.  The scope was removed slowly by carefully examining the color, texture, anatomy, and integrity mucosa on the way out.  The patient was recovered in endoscopy and discharged home in satisfactory condition.    COLON FINDINGS: The mucosa appeared normal in the terminal ileum.  , Two sessile polyps measuring 2-3 mm in size were found in the rectum.  A polypectomy was performed with cold forceps.  , and Moderate sized internal hemorrhoids were found.  PREP QUALITY: good.CECAL W/D TIME: 19 minutes     COMPLICATIONS: None  ENDOSCOPIC IMPRESSION: 1.   Normal mucosa in the terminal ileum 2.   Two COLON polyps REMOVED 3.   Moderate sized internal hemorrhoids  RECOMMENDATIONS: FOLLOW A HIGH FIBER DIET.  AVOID ITEMS THAT CAUSE BLOATING & GAS. BIOPSY RESULTS SHOULD BE BACK IN 7 DAYS.  Next colonoscopy in 5-10 years.       _______________________________ Lorrin MaisBarney Drain, MD  08/28/2013 9:50 AM

## 2013-08-28 NOTE — Discharge Instructions (Signed)
You had 2 small polyps removed FROM YOUR RECTUM. You have internal hemorrhoids.   FOLLOW A HIGH FIBER DIET. AVOID ITEMS THAT CAUSE BLOATING & GAS. SEE INFO BELOW.  YOUR BIOPSY RESULTS SHOULD BE BACK IN 7 DAYS.  Next colonoscopy in 5-10 years.    Colonoscopy Care After Read the instructions outlined below and refer to this sheet in the next week. These discharge instructions provide you with general information on caring for yourself after you leave the hospital. While your treatment has been planned according to the most current medical practices available, unavoidable complications occasionally occur. If you have any problems or questions after discharge, call DR. Elion Hocker, [phone].  ACTIVITY  You may resume your regular activity, but move at a slower pace for the next 24 hours.   Take frequent rest periods for the next 24 hours.   Walking will help get rid of the air and reduce the bloated feeling in your belly (abdomen).   No driving for 24 hours (because of the medicine (anesthesia) used during the test).   You may shower.   Do not sign any important legal documents or operate any machinery for 24 hours (because of the anesthesia used during the test).    NUTRITION  Drink plenty of fluids.   You may resume your normal diet as instructed by your doctor.   Begin with a light meal and progress to your normal diet. Heavy or fried foods are harder to digest and may make you feel sick to your stomach (nauseated).   Avoid alcoholic beverages for 24 hours or as instructed.    MEDICATIONS  You may resume your normal medications.   WHAT YOU CAN EXPECT TODAY  Some feelings of bloating in the abdomen.   Passage of more gas than usual.   Spotting of blood in your stool or on the toilet paper  .  IF YOU HAD POLYPS REMOVED DURING THE COLONOSCOPY:  Eat a soft diet IF YOU HAVE NAUSEA, BLOATING, ABDOMINAL PAIN, OR VOMITING.    FINDING OUT THE RESULTS OF YOUR TEST Not  all test results are available during your visit. DR. Oneida Alar WILL CALL YOU WITHIN 7 DAYS OF YOUR PROCEDUE WITH YOUR RESULTS. Do not assume everything is normal if you have not heard from DR. Acelyn Basham IN ONE WEEK, CALL HER OFFICE AT [phone].  SEEK IMMEDIATE MEDICAL ATTENTION AND CALL THE OFFICE: [phone] IF:  You have more than a spotting of blood in your stool.   Your belly is swollen (abdominal distention).   You are nauseated or vomiting.   You have a temperature over 101F.   You have abdominal pain or discomfort that is severe or gets worse throughout the day.   High-Fiber Diet A high-fiber diet changes your normal diet to include more whole grains, legumes, fruits, and vegetables. Changes in the diet involve replacing refined carbohydrates with unrefined foods. The calorie level of the diet is essentially unchanged. The Dietary Reference Intake (recommended amount) for adult males is 38 grams per day. For adult females, it is 25 grams per day. Pregnant and lactating women should consume 28 grams of fiber per day. Fiber is the intact part of a plant that is not broken down during digestion. Functional fiber is fiber that has been isolated from the plant to provide a beneficial effect in the body. PURPOSE  Increase stool bulk.   Ease and regulate bowel movements.   Lower cholesterol.  INDICATIONS THAT YOU NEED MORE FIBER  Constipation and hemorrhoids.  Uncomplicated diverticulosis (intestine condition) and irritable bowel syndrome.   Weight management.   As a protective measure against hardening of the arteries (atherosclerosis), diabetes, and cancer.   GUIDELINES FOR INCREASING FIBER IN THE DIET  Start adding fiber to the diet slowly. A gradual increase of about 5 more grams (2 slices of whole-wheat bread, 2 servings of most fruits or vegetables, or 1 bowl of high-fiber cereal) per day is best. Too rapid an increase in fiber may result in constipation, flatulence, and  bloating.   Drink enough water and fluids to keep your urine clear or pale yellow. Water, juice, or caffeine-free drinks are recommended. Not drinking enough fluid may cause constipation.   Eat a variety of high-fiber foods rather than one type of fiber.   Try to increase your intake of fiber through using high-fiber foods rather than fiber pills or supplements that contain small amounts of fiber.   The goal is to change the types of food eaten. Do not supplement your present diet with high-fiber foods, but replace foods in your present diet.  INCLUDE A VARIETY OF FIBER SOURCES  Replace refined and processed grains with whole grains, canned fruits with fresh fruits, and incorporate other fiber sources. White rice, white breads, and most bakery goods contain little or no fiber.   Brown whole-grain rice, buckwheat oats, and many fruits and vegetables are all good sources of fiber. These include: broccoli, Brussels sprouts, cabbage, cauliflower, beets, sweet potatoes, white potatoes (skin on), carrots, tomatoes, eggplant, squash, berries, fresh fruits, and dried fruits.   Cereals appear to be the richest source of fiber. Cereal fiber is found in whole grains and bran. Bran is the fiber-rich outer coat of cereal grain, which is largely removed in refining. In whole-grain cereals, the bran remains. In breakfast cereals, the largest amount of fiber is found in those with "bran" in their names. The fiber content is sometimes indicated on the label.   You may need to include additional fruits and vegetables each day.   In baking, for 1 cup white flour, you may use the following substitutions:   1 cup whole-wheat flour minus 2 tablespoons.   1/2 cup white flour plus 1/2 cup whole-wheat flour.   Polyps, Colon  A polyp is extra tissue that grows inside your body. Colon polyps grow in the large intestine. The large intestine, also called the colon, is part of your digestive system. It is a long, hollow  tube at the end of your digestive tract where your body makes and stores stool. Most polyps are not dangerous. They are benign. This means they are not cancerous. But over time, some types of polyps can turn into cancer. Polyps that are smaller than a pea are usually not harmful. But larger polyps could someday become or may already be cancerous. To be safe, doctors remove all polyps and test them.   WHO GETS POLYPS? Anyone can get polyps, but certain people are more likely than others. You may have a greater chance of getting polyps if:  You are over 50.   You have had polyps before.   Someone in your family has had polyps.   Someone in your family has had cancer of the large intestine.   Find out if someone in your family has had polyps. You may also be more likely to get polyps if you:   Eat a lot of fatty foods   Smoke   Drink alcohol   Do not exercise  Eat too  much   TREATMENT  The caregiver will remove the polyp during sigmoidoscopy or colonoscopy.    PREVENTION There is not one sure way to prevent polyps. You might be able to lower your risk of getting them if you:  Eat more fruits and vegetables and less fatty food.   Do not smoke.   Avoid alcohol.   Exercise every day.   Lose weight if you are overweight.   Eating more calcium and folate can also lower your risk of getting polyps. Some foods that are rich in calcium are milk, cheese, and broccoli. Some foods that are rich in folate are chickpeas, kidney beans, and spinach.   Hemorrhoids Hemorrhoids are dilated (enlarged) veins around the rectum. Sometimes clots will form in the veins. This makes them swollen and painful. These are called thrombosed hemorrhoids. Causes of hemorrhoids include:  Constipation.   Straining to have a bowel movement.   HEAVY LIFTING HOME CARE INSTRUCTIONS  Eat a well balanced diet and drink 6 to 8 glasses of water every day to avoid constipation. You may also use a bulk  laxative.   Avoid straining to have bowel movements.   Keep anal area dry and clean.   Do not use a donut shaped pillow or sit on the toilet for long periods. This increases blood pooling and pain.   Move your bowels when your body has the urge; this will require less straining and will decrease pain and pressure.

## 2013-09-02 ENCOUNTER — Encounter (HOSPITAL_COMMUNITY): Payer: Self-pay | Admitting: Gastroenterology

## 2013-09-18 ENCOUNTER — Telehealth: Payer: Self-pay | Admitting: Gastroenterology

## 2013-09-18 NOTE — Telephone Encounter (Signed)
Please call pt. 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 TCS in 5-10 years. High fiber diet.

## 2013-09-19 NOTE — Telephone Encounter (Signed)
Called and informed pt.  

## 2013-12-03 ENCOUNTER — Other Ambulatory Visit (HOSPITAL_COMMUNITY): Payer: Self-pay | Admitting: Internal Medicine

## 2013-12-03 DIAGNOSIS — Z1231 Encounter for screening mammogram for malignant neoplasm of breast: Secondary | ICD-10-CM

## 2013-12-10 ENCOUNTER — Ambulatory Visit (HOSPITAL_COMMUNITY)
Admission: RE | Admit: 2013-12-10 | Discharge: 2013-12-10 | Disposition: A | Discharge status: Home / Self Care | Payer: 59 | Source: Ambulatory Visit | Attending: Internal Medicine | Admitting: Internal Medicine

## 2013-12-10 DIAGNOSIS — Z1231 Encounter for screening mammogram for malignant neoplasm of breast: Secondary | ICD-10-CM | POA: Insufficient documentation

## 2014-03-30 ENCOUNTER — Encounter (HOSPITAL_COMMUNITY): Payer: Self-pay | Admitting: Gastroenterology

## 2014-09-18 NOTE — Op Note (Signed)
PATIENT NAME:  Wanda [name], Wanda [name] MR#:  [id number] DATE OF BIRTH:  27-Apr-1956  DATE OF PROCEDURE:  12/12/2012  Location: Operating Room Referring Physician: Dr. Neomia Dear Primary Care Physician: Dr. Wende Neighbors  Procedure by: Kathlen Brunswick. Dossie Arbour, M.D.  Note: This is the case of a 59 year old white female patient referred to our service for permanent placement of a percutaneous lumbar spinal cord stimulator. 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 In addition, the patient has an old L1 compression fracture and left lower extremity chronic radiculopathy/radiculitis. On 10/23/2012, the patient underwent a successful trial of a bilateral percutaneous spinal cord stimulator leads by Dr. Neomia Dear. On 10/30/2012, the patient returned to Dr. Ace Gins for removal of the trial leads and evaluation at that time revealed that the patient had used the device 99% of the time. The patient stated that with the device her pain score was a 0 over 10 and prior to the device it was a 9 over 10. Based on the success of the trial, the patient was sent for evaluation to our offices and was initially seen on 12/04/2012. During that visit, the patient was again questioned about the results of the trial, which she confirmed. The patient was then offered permanent implant after having informed her of all the risks and possible complications. The patient decided to proceed with this and based on that, she was scheduled today to come in to same day surgery for implant of the permanent device.   Procedure(s):  1. Permanent implantation of Lumbar Epidural, Double Percutaneous Neurostimulator Leads (16 electrode array). 2. Implantation of Epidural Neurostimulator Generator. 3. Fluoroscopic Needle Guidance 4. Intraoperative Analysis and Programming. 5. Postoperative Analysis and  Programming. 6. Moderate Conscious Sedation by the [facility] Anesthesia Team.  Surgeon: Kathlen Brunswick. Dossie Arbour, M.D. Side of implant: Bilateral with the generator pack in the left buttock area (as requested by the patient). Top electrode tip level: Lower to mid T7 vertebral body, bilaterally. Diagnostic Indications: Chronic Lumbosacral Radiculopathy/Radiculitis. Position: Prone.  Prepping solution: DuraPrep Area prepped: The thoracolumbar and sacral areas, were prepped with a broad-spectrum topical antiseptic microbicide. Target area: Lumbar Epidural Space, around the T8-9 vertebral body, for the electrode tip. Insertion site is the L1-2 intervertebral space. Level entered: L1-2 Number of attempts: one  Infection Control: Standard Universal Precautions taken (Respiratory Hygiene/Cough Etiquette; Mouth, nose, eye protection; Hand Hygiene; Personal protective equipment (PPE); safe injection practices; and use of masks and disposable sterile surgical gloves) as recommended by the Department of [place] for Disease Control and Prevention (CDC).  Safety Measures: Allergies were reviewed. Appropriate site, procedure, and patient were confirmed by following the Joint Commission's Universal Protocol (UP.01.01.01). The patient was asked to confirm marked site and procedure, before commencing. The patient was asked about blood thinners, or active infections, both of which were denied. No attempt was made at seeking any paresthesias. Aspiration looking for blood return was conducted prior to injecting. At no point did we inject any substances, as a needle was being advanced.  Pre-procedure Assessment:  A medical history and physical exam were obtained. Relevant documentation was reviewed and verified. Prior to the procedure, the patient was provided with an Audio CD, as well as written information on the procedure, including side-effects, and possible complications. Under the influence of no  sedatives, a verbal, as well as a written informed consent were obtained, after having provided  information on the risks and possible complications. To fulfill our ethical and legal obligations, as recommended by the American Medical Association's Code of Ethics, we have provided information to the patient about our clinical impression; the nature and purpose of an available treatment or procedure; the risks and benefits of an available treatment or procedure; alternatives; the risk and benefits of the alternative treatment or procedure; and the risks and benefits of not receiving or undergoing a treatment or procedure. The patient was provided information about the risks and possible complications associated with the procedure. These include, but not limited to, failure to achieve desired goals, infection, bleeding, organ or nerve damage, allergic reactions, paralysis, and death. In addition, the patient was informed that Medicine is not an exact science; therefore, there is also the possibility of unforeseen risks and possible complications that may result in a catastrophic outcome. The patient indicated having understood very clearly.  We have given the patient no guarantees and we have made no promises. Ample time was given to the patient to ask questions, all of which were answered, to the patient's satisfaction, before proceeding. The patient understands that by signing our informed consent form, they understand and accept the risks and the fact that it is impossible to predict all possible complications. Baseline vital signs were taken and the medical assessment was completed. Verification of the correct person, correct site (including marking of site), and correct procedure were performed and confirmed by the patient. Baseline vital signs were taken and the initial assessment was completed. Verification of the correct person, correct site (including marking of site), and correct procedure were performed and  confirmed by the patient, in the form of a "Time Out".  Monitoring: The patient was monitored in the usual manner, using NIBPM, ECG, and pulse oximetry.  IV Access:  An IV access was obtained and secured.  Analgesia:  Moderate (Conscious) Intravenous sedation: Consent was obtained before administering any sedation. Availability of a responsible, adult driver, and NPO status confirmed. Meaningful verbal contact was maintained, with the patient at all times during the procedure. ASA Sedation Guidelines followed. For specifics on pharmacological type and quantity of sedation, please see nursing chart.  Prophylactic Antibiotics: Cefazolin (1st generation cephalosporin) 1 gm IVPB.  Local Anesthesia: Lidocaine 1%. The skin over the procedure site were infiltrated using a 3 ml Luer-Lok syringe with a 0.5 inch, 25-G needle. Deeper tissues were infiltrated using a 3.0 inch, 22-G spinal needle, under fluoroscopic guidance.  Fluoroscopy: The patient was taken to the operative suite, where the patient was placed in position for the procedure, over the fluoroscopy compatible table. Fluoroscopy was manipulated, using "Tunnel Vision Technique", to obtain the best possible view of the target area, on the affected side. Parallax error was corrected before commencing the procedure. Gabor Racz's "Direction-Depth-Direction" technique was used to introduce the procedural needle under continuous pulsed fluoroscopic guidance. Once the target was reached, antero-posterior and lateral fluoroscopic views were taken to confirm needle placement in two planes. Fluoroscopy time: Please see the patient's chart for details.  Description of the procedure: The procedure site was prepped using a broad-spectrum topical antiseptic. The area was then draped in the usual and standard manner. "Time-out" was performed as per JC Universal Protocol (UP.01.01.01).   A midline incision was made over the spinous processes, and hemostasis  attained. The procedural needle was then introduced through the incision using Gabor Racz's "Direction-Depth-Direction" technique, under pulsed fluoroscopic guidance. No attempt was made at seeking a paresthesia. The paramidline approach was  used to enter the posterior lumbar epidural space at a 30 degree angle, using "Loss-of-resistance Technique" with 3 ml of PF-NaCl (0.9% NSS) + 0.5 ml of air, in a 5 ml glass syringe, using a "loss-of-bounce technique", at the desired level. Correct needle placement was confirmed in the  antero-posterior and lateral fluoroscopic views. The epidural lead was gently introduced under real-time fluoroscopy, constantly assessing for pain or paresthesias, until the tip was observed to be at the target level, on the side ipsilateral to the pain. The placement of the second lead was accomplished in a similar fashion. Once the target was thought to have been reached, antero-posterior and lateral fluoroscopic views were taken to confirm electrode placement in two planes. Placement was tested until a comfortable stimulation pattern was observed over the usual painful area. Once the patient had assured [place] that the stimulation was in the correct pattern, and distribution, we proceeded to remove the 15-G "Tuohy" epidural needle(s). This was done while observing the electrode tip(s) under real-time fluoroscopy to prevent movement. The patient was sedated again, and 1% lidocaine was infiltrated into the buttocks area, in order to create the generator pocket. The extension was tunneled and connected to the generator. An impedance check was conducted after connecting all sections of the system. Once hemostasis was confirmed, both wounds were closed with Vicryl 2-0 after cleaning them with a solution containing 50:50 hydrogen peroxide and Betadine. Surgical staples were used to close the skin. The wounds were covered with sterile transparent bio-occlusive dressings, to easily assess any evidence of  infection in the future.  The patient tolerated the entire procedure well. A repeat set of vitals were taken after the procedure and the patient was kept under observation until discharge criteria was met. The patient was provided with discharge instructions, including a section on how to identify potential problems. Should any problems arise concerning this procedure, the patient was given instructions to immediately contact us, without hesitation. The neurostimulator representative and I, both provided the patient with our Business cards containing our contact telephone numbers, and instructed the patient to contact either one of [place], at any time, should there be any problems or questions. In any case, we plan to contact the patient by telephone for a follow-up status report regarding this interventional procedure.  EBL: 10 mL  Complications: No heme; no paresthesias.  Disposition: Return to clinics in 10-11 days for removal of staples and postoperative evaluation.  Additional Comments/Plan: None.  Equipment usedBanker, model [id number], serial [id number]. Medtronic lead kit, model [id number], lot [id number]. Medtronic lead kit, model Y9902962, lot [id number]. Medtronic patient programmer, model [id number], serial [id number]. Medtronic charging system, model [id number], serial [id number].  Disclaimer: Medicine is not an Chief Strategy Officer. The only guarantee in medicine is that nothing is guaranteed. It is important to note that the decision to proceed with this intervention was based on the information collected from the patient. The Data and conclusions were drawn from the patient's questionnaire, the interview, and the physical examination. Because the information was provided in large part by the patient, it cannot be guaranteed that it has not been purposely or unconsciously manipulated. Every effort has been made to obtain as much relevant data as possible for this evaluation. It  is important to note that the conclusions that lead to this procedure are derived in large part from the available data. Always take into account that the treatment will also be dependent on availability of resources and existing treatment guidelines, considered by other  Pain Management Practitioners as being common knowledge and practice, at this time. For Medico-Legal purposes, it is also important to point out that variations in procedural techniques and pharmacological choices are the acceptable norm. The indications, contraindications, technique, and results of the above procedure should only be interpreted and judged by a Board-Certified Interventional Pain Specialist with extensive familiarity and expertise in the same exact procedure and technique, doing otherwise would be inappropriate and unethical.  ____________________________ Kathlen Brunswick. Dossie Arbour, MD fan:aw D: 12/13/2012 10:48:21 ET T: 12/13/2012 11:02:18 ET JOB#: [id number]  cc: Charlynn Salih A. Dossie Arbour, MD, <Dictator> Neomia Dear, MD Wende Neighbors, MD FAX: [phone] Gaspar Cola MD ELECTRONICALLY SIGNED 12/13/2012 12:54

## 2015-08-12 ENCOUNTER — Telehealth: Payer: Self-pay | Admitting: *Deleted

## 2015-09-02 DIAGNOSIS — F418 Other specified anxiety disorders: Secondary | ICD-10-CM | POA: Insufficient documentation

## 2015-09-20 ENCOUNTER — Encounter: Payer: Self-pay | Admitting: *Deleted

## 2015-10-01 DIAGNOSIS — R7303 Prediabetes: Secondary | ICD-10-CM | POA: Insufficient documentation

## 2015-10-01 DIAGNOSIS — R739 Hyperglycemia, unspecified: Secondary | ICD-10-CM | POA: Insufficient documentation

## 2015-10-05 ENCOUNTER — Other Ambulatory Visit: Payer: Self-pay | Admitting: Family Medicine

## 2015-10-05 DIAGNOSIS — Z1231 Encounter for screening mammogram for malignant neoplasm of breast: Secondary | ICD-10-CM

## 2015-10-18 ENCOUNTER — Telehealth: Payer: Self-pay | Admitting: *Deleted

## 2015-10-18 ENCOUNTER — Ambulatory Visit (HOSPITAL_BASED_OUTPATIENT_CLINIC_OR_DEPARTMENT_OTHER): Payer: BLUE CROSS/BLUE SHIELD | Admitting: Pain Medicine

## 2015-10-18 ENCOUNTER — Encounter: Payer: Self-pay | Admitting: Pain Medicine

## 2015-10-18 ENCOUNTER — Ambulatory Visit
Admission: RE | Admit: 2015-10-18 | Discharge: 2015-10-18 | Disposition: A | Discharge status: Home / Self Care | Payer: BLUE CROSS/BLUE SHIELD | Source: Ambulatory Visit | Attending: Pain Medicine | Admitting: Pain Medicine

## 2015-10-18 VITALS — BP 122/81 | HR 86 | Temp 98.3°F | Resp 16 | Ht 61.0 in | Wt 146.0 lb

## 2015-10-18 DIAGNOSIS — J209 Acute bronchitis, unspecified: Secondary | ICD-10-CM | POA: Insufficient documentation

## 2015-10-18 DIAGNOSIS — E785 Hyperlipidemia, unspecified: Secondary | ICD-10-CM | POA: Insufficient documentation

## 2015-10-18 DIAGNOSIS — Z7982 Long term (current) use of aspirin: Secondary | ICD-10-CM | POA: Insufficient documentation

## 2015-10-18 DIAGNOSIS — F418 Other specified anxiety disorders: Secondary | ICD-10-CM | POA: Diagnosis not present

## 2015-10-18 DIAGNOSIS — M5136 Other intervertebral disc degeneration, lumbar region: Secondary | ICD-10-CM | POA: Diagnosis not present

## 2015-10-18 DIAGNOSIS — M79605 Pain in left leg: Secondary | ICD-10-CM

## 2015-10-18 DIAGNOSIS — F1010 Alcohol abuse, uncomplicated: Secondary | ICD-10-CM | POA: Diagnosis not present

## 2015-10-18 DIAGNOSIS — M25551 Pain in right hip: Secondary | ICD-10-CM | POA: Diagnosis not present

## 2015-10-18 DIAGNOSIS — M1611 Unilateral primary osteoarthritis, right hip: Secondary | ICD-10-CM

## 2015-10-18 DIAGNOSIS — G43009 Migraine without aura, not intractable, without status migrainosus: Secondary | ICD-10-CM | POA: Diagnosis not present

## 2015-10-18 DIAGNOSIS — Z87891 Personal history of nicotine dependence: Secondary | ICD-10-CM | POA: Insufficient documentation

## 2015-10-18 DIAGNOSIS — M545 Low back pain, unspecified: Secondary | ICD-10-CM | POA: Insufficient documentation

## 2015-10-18 DIAGNOSIS — E876 Hypokalemia: Secondary | ICD-10-CM | POA: Diagnosis not present

## 2015-10-18 DIAGNOSIS — M47816 Spondylosis without myelopathy or radiculopathy, lumbar region: Secondary | ICD-10-CM

## 2015-10-18 DIAGNOSIS — M160 Bilateral primary osteoarthritis of hip: Secondary | ICD-10-CM | POA: Insufficient documentation

## 2015-10-18 DIAGNOSIS — K529 Noninfective gastroenteritis and colitis, unspecified: Secondary | ICD-10-CM | POA: Diagnosis not present

## 2015-10-18 DIAGNOSIS — Z87442 Personal history of urinary calculi: Secondary | ICD-10-CM | POA: Insufficient documentation

## 2015-10-18 DIAGNOSIS — K5900 Constipation, unspecified: Secondary | ICD-10-CM | POA: Insufficient documentation

## 2015-10-18 DIAGNOSIS — I471 Supraventricular tachycardia: Secondary | ICD-10-CM | POA: Diagnosis not present

## 2015-10-18 DIAGNOSIS — J309 Allergic rhinitis, unspecified: Secondary | ICD-10-CM | POA: Diagnosis not present

## 2015-10-18 DIAGNOSIS — N3946 Mixed incontinence: Secondary | ICD-10-CM | POA: Insufficient documentation

## 2015-10-18 DIAGNOSIS — G8929 Other chronic pain: Secondary | ICD-10-CM

## 2015-10-18 DIAGNOSIS — K219 Gastro-esophageal reflux disease without esophagitis: Secondary | ICD-10-CM | POA: Diagnosis not present

## 2015-10-18 DIAGNOSIS — M25552 Pain in left hip: Secondary | ICD-10-CM | POA: Diagnosis not present

## 2015-10-18 DIAGNOSIS — M8448XA Pathological fracture, other site, initial encounter for fracture: Secondary | ICD-10-CM | POA: Diagnosis not present

## 2015-10-18 DIAGNOSIS — Z79899 Other long term (current) drug therapy: Secondary | ICD-10-CM

## 2015-10-18 DIAGNOSIS — M79604 Pain in right leg: Secondary | ICD-10-CM

## 2015-10-18 DIAGNOSIS — Z881 Allergy status to other antibiotic agents status: Secondary | ICD-10-CM | POA: Insufficient documentation

## 2015-10-18 DIAGNOSIS — Z9689 Presence of other specified functional implants: Secondary | ICD-10-CM | POA: Diagnosis not present

## 2015-10-18 DIAGNOSIS — Z885 Allergy status to narcotic agent status: Secondary | ICD-10-CM | POA: Insufficient documentation

## 2015-10-18 HISTORY — DX: Other long term (current) drug therapy: Z79.899

## 2015-10-18 HISTORY — DX: Presence of other specified functional implants: Z96.89

## 2015-10-18 NOTE — Progress Notes (Signed)
Safety precautions to be maintained throughout the outpatient stay will include: orient to surroundings, keep bed in low position, maintain call bell within reach at all times, provide assistance with transfer out of bed and ambulation.  

## 2015-10-18 NOTE — Progress Notes (Signed)
Patient's Name: Wanda [name]  Patient type: New patient  MRN: [id number]  Service setting: Ambulatory outpatient  DOB: July 26, 1955  Location: [facility]  DOS: 10/18/2015  Primary Care Physician: Wanda Neighbors, MD  Note by: Wanda [name]. Wanda [name], M.D, DABA, DABAPM, DABPM, DABIPP, FIPP  Referring Physician: Drema [name],*  Specialty: Board-Certified Interventional Pain Management     Primary Reason(s) for Visit: Initial Patient Evaluation CC: Back Pain and Shoulder Pain   HPI  Wanda [name] is a 60 y.o. year old, female patient, who comes today for an initial evaluation. 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 Her primarily concern today is the Back Pain and Shoulder Pain   Pain Assessment: Self-Reported Pain Score: 7 , clinically she looks like a 2-3/10. Reported level of pain is not compatible with clinical observations. This symptom exaggeration may be due to malingering, an emotional response, Somatic Symptom Disorder, or a lack of understanding on how the pain scale works. Pain Type: Chronic pain Pain Location: Back Pain  Orientation: Lower Pain Descriptors / Indicators: Aching, Constant, Radiating (when laying down  on the hip; the pain starts ) Pain Frequency: Constant  Onset and Duration: Gradual, Date of onset: Before 2014 and Present longer than 3 months Cause of pain: Arthritis Severity: Getting worse, NAS-11 at its worse: 7/10, NAS-11 at its best: 7/10, NAS-11 now: 7/10 and NAS-11 on the average: 7-8/10 Timing: Morning, Afternoon, Night, During activity or exercise, After activity or exercise and After a period of immobility Aggravating Factors: Bending, Climbing, Kneeling, Lifiting, Prolonged sitting, Prolonged standing, Squatting, Stooping  and Walking uphill Alleviating Factors: Cold packs, Hot packs, Lying down, Using a brace and Warm showers or baths Associated Problems: Depression, Fatigue, Inability to concentrate, Sadness, Sweating, Swelling and Pain that wakes patient up Quality of Pain: Aching, Annoying, Constant, Disabling, Distressing, Dull, Nagging, Stabbing, Toothache-like and Uncomfortable Previous Examinations or Tests: Nerve conduction test and Psychiatric evaluation Previous Treatments: Epidural steroid injections and Spinal cord stimulator  The patient comes into the clinics today for the first time for a chronic pain management evaluation. The patient indicates that her primary pain is in the lower back with the right side being worst on the left. This pain also seems to refer down to the back and the anterior aspect of the knee through the lateral portion of the legs. She has also noticed some swelling when he comes to the knees themselves. This patient is well known to me as I implanted her spinal cord stimulator on 12/12/2012. She was initially referred to me by Wanda [name] for the implant. The patient indicates that Wanda [name] has decided to get out of pain medicine due to the poor ring burst and on the field.([phone]  Historic Controlled Substance Pharmacotherapy  Review  No Opioids taken. Risk Assessment: Aberrant Behavior: None observed or detected today Opioid Fatal Overdose Risk Factors:  None identified today Non-fatal overdose hazard ratio (HR): Calculation deferred Fatal overdose hazard ratio (HR): Calculation deferred Substance Use Disorder (SUD) Risk Level: Pending results of Medical Psychology Evaluation for SUD Opioid Risk Tool (ORT) Score: Total Score: 8 High Risk for Opioid Abuse (Score >8) Depression Scale Score: PHQ-2: PHQ-2 Total Score: 6 78.6% Probability of major depressive disorder (6) PHQ-9: PHQ-9 Total Score: 21 Severe depression (20-27). (Risk: 2.4 times more likely to use opioids for reasons other than pain control and 2.89 times more likely to use more opioids than prescribed  Pharmacologic Plan: Pending ordered tests and/or consults  Meds  The patient has a current medication list which includes the following prescription(s): alprazolam, aspirin ec, atorvastatin, bupropion, buspirone, garlic, multiple vitamins-minerals, nadolol, fish oil, pantoprazole, UNABLE TO FIND, and valacyclovir.  ROS  Cardiovascular History: Abnormal heart rhythm and Daily Aspirin intake Pulmonary or Respiratory History: Shortness of breath Neurological History: Negative for epilepsy, stroke, urinary or fecal inontinence, spina bifida or tethered cord syndrome Review of Past Neurological Studies: No results found for this or any previous visit. Psychological-Psychiatric History: Anxiety, Depression and Panic Attacks Gastrointestinal History: Reflux or heatburn Genitourinary History: Negative for nephrolithiasis, hematuria, renal failure or chronic kidney disease Hematological History: Brusing easily Endocrine History: Negative for diabetes or thyroid disease Rheumatologic History: Negative for lupus, osteoarthritis, rheumatoid arthritis, myositis, polymyositis or fibromyagia Musculoskeletal History: Negative for myasthenia gravis, muscular dystrophy,  multiple sclerosis or malignant hyperthermia Work History: Working part time  Allergies  Wanda [name] is allergic to codeine; levofloxacin; and propoxyphene hcl.  [place]  Medical:  Ms. Rezek  has a past medical history of Irregular heart beat; GERD (gastroesophageal reflux disease); Hyperlipidemia; Herpes; Depression; and Anxiety. Family: family history includes Alcohol abuse in her daughter; COPD in her father and mother; Cancer in her cousin and paternal aunt; Depression in her daughter; Diabetes in her cousin, father, mother, paternal grandmother, and sister; Heart disease in her father and mother; Tuberculosis in her maternal aunt. There is no history of Colon cancer. Surgical:  has past surgical history that includes Shoulder surgery; Appendectomy; Abdominal hysterectomy; Back surgery; Spinal cord stimulator; and Colonoscopy (N/A, 08/28/2013). Tobacco:  reports that she has quit smoking. Her smoking use included Cigarettes. She has a 3.5 pack-year smoking history. She quit smokeless tobacco use about 25 years ago. Alcohol:  reports that she drinks alcohol. Drug:  reports that she does not use illicit drugs. Active Ambulatory Problems    Diagnosis Date Noted  . HYPERLIPIDEMIA 06/29/2009  . HYPOKALEMIA 12/11/2008  . ANXIETY DEPRESSION 12/11/2008  . MIGRAINE, COMMON 12/11/2008  . SUPRAVENTRICULAR TACHYCARDIA 12/11/2008  . ALLERGIC RHINITIS 12/11/2008  . GERD 12/11/2008  . GASTROENTERITIS WITHOUT DEHYDRATION 05/14/2009  . CONSTIPATION, CHRONIC 12/03/2009  . Generalized osteoarthrosis of hand 12/11/2008  . UNSPECIFIED OSTEOPOROSIS 12/11/2008  . OSTEOPENIA 08/17/2009  . URINARY INCONTINENCE, MIXED 12/11/2008  . NEPHROLITHIASIS, HX OF 12/11/2008  . BRONCHITIS, ACUTE WITH BRONCHOSPASM 06/25/2010  . Chronic prescription benzodiazepine use 10/18/2015  . Chronic low back pain (Location of Primary Source of Pain) (Bilateral) (R>L) 10/18/2015  . Spinal cord stimulator status (battery on left  buttocks) 10/18/2015  . Lumbar spondylosis 10/18/2015  . Chronic pain of left lower extremity 10/18/2015  . Lumbar facet arthropathy 10/18/2015  . Lumbar facet syndrome (Location of Primary Source of Pain) (Bilateral) (R>L) 10/18/2015  . Chronic lower extremity pain (Location of Secondary source of pain) (Right) 10/18/2015  . Chronic hip pain (Location of Secondary source of pain) (Right) 10/18/2015  . Osteoarthritis of hip (  Right) 10/18/2015   Resolved Ambulatory Problems    Diagnosis Date Noted  . No Resolved Ambulatory Problems   Past Medical History  Diagnosis Date  . Irregular heart beat   . GERD (gastroesophageal reflux disease)   . Hyperlipidemia   . Herpes   . Depression   . Anxiety     Constitutional Exam  Vitals: Blood pressure 122/81, pulse 86, temperature 98.3 F (36.8 C), temperature source Oral, resp. rate 16, height 5\' 1"  (1.549 m), weight 146 lb (66.225 kg), SpO2 96 %. General appearance: Well nourished, well developed, and well hydrated. In no acute distress Calculated BMI/Body habitus: Body mass index is 27.6 kg/(m^2). (25-29.9 kg/m2) Overweight - 20% higher incidence of chronic pain Psych/Mental status: Alert and oriented x 3 (person, place, & time) Eyes: PERLA Respiratory: No evidence of acute respiratory distress  Cervical Spine Exam  Inspection: No masses, redness, or swelling Alignment: Symmetrical ROM: Functional: Adequate ROM Active: Unrestricted ROM Stability: No instability detected Muscle strength & Tone: Functionally intact Sensory: Unimpaired Palpation: No complaints of tenderness  Upper Extremity (UE) Exam    Side: Right upper extremity  Side: Left upper extremity  Inspection: No masses, redness, swelling, or asymmetry  Inspection: No masses, redness, swelling, or asymmetry  ROM:  ROM:  Functional: Adequate ROM  Functional: Adequate ROM  Active: Unrestricted ROM  Active: Unrestricted ROM  Muscle strength & Tone: Functionally intact   Muscle strength & Tone: Functionally intact  Sensory: Unimpaired  Sensory: Unimpaired  Palpation: Non-contributory  Palpation: Non-contributory   Thoracic Spine Exam  Inspection: No masses, redness, or swelling Alignment: Symmetrical ROM: Functional: Adequate ROM Active: Unrestricted ROM Stability: No instability detected Sensory: Unimpaired Muscle strength & Tone: Functionally intact Palpation: No complaints of tenderness  Lumbar Spine Exam  Inspection: No masses, redness, or swelling Alignment: Symmetrical ROM: Functional: Limited ROM Active: Restricted ROM Stability: No instability detected Muscle strength & Tone: Increased muscle tone and tension Sensory: Unimpaired Palpation: Tender with increased muscle tone Provocative Tests: Lumbar Hyperextension and rotation test: Positive for bilateral lumbar facet pain Patrick's Maneuver: Positive for bilateral hip joint pain (R>L)  Gait & Posture Assessment  Ambulation: Unassisted Gait: Antalgic Posture: Difficulty with positional changes  Lower Extremity Exam    Side: Right lower extremity  Side: Left lower extremity  Inspection: No masses, redness, swelling, or asymmetry ROM:  Inspection: No masses, redness, swelling, or asymmetry ROM:  Functional: Limited ROM of the hip   Functional: Limited ROM for the hip   Active: Decreased ROM of the hip   Active: Restricted ROM for the hip   Muscle strength & Tone: Functionally intact  Muscle strength & Tone: Functionally intact  Sensory: Unimpaired  Sensory: Unimpaired  Palpation: Tender  Palpation: Non-contributory   Assessment  Primary Diagnosis & Pertinent Problem List: The primary encounter diagnosis was Chronic prescription benzodiazepine use. Diagnoses of Chronic low back pain, Spinal cord stimulator status, Lumbar spondylosis, unspecified spinal osteoarthritis, Chronic pain of left lower extremity, Lumbar facet arthropathy, Lumbar facet syndrome (Location of Primary Source of  Pain) (Bilateral) (R>L), Chronic lower extremity pain (Location of Secondary source of pain) (Right), Chronic hip pain (Location of Secondary source of pain) (Right), and Primary osteoarthritis of right hip were also pertinent to this visit.  Visit Diagnosis: 1. Chronic prescription benzodiazepine use   2. Chronic low back pain   3. Spinal cord stimulator status   4. Lumbar spondylosis, unspecified spinal osteoarthritis   5. Chronic pain of left lower extremity   6. Lumbar  facet arthropathy   7. Lumbar facet syndrome (Location of Primary Source of Pain) (Bilateral) (R>L)   8. Chronic lower extremity pain (Location of Secondary source of pain) (Right)   9. Chronic hip pain (Location of Secondary source of pain) (Right)   10. Primary osteoarthritis of right hip     Assessment: No problem-specific assessment & plan notes found for this encounter.   Plan of Care  Initial Treatment Plan:  Please be advised that as per protocol, today's visit has been an evaluation only. We have not taken over the patient's controlled substance management.  Problem List Items Addressed This Visit      High   Chronic hip pain (Location of Secondary source of pain) (Right) (Chronic)   Relevant Orders   DG HIP UNILAT W OR W/O PELVIS 2-3 VIEWS LEFT   DG HIP UNILAT W OR W/O PELVIS 2-3 VIEWS RIGHT   Chronic low back pain (Location of Primary Source of Pain) (Bilateral) (R>L) (Chronic)   Chronic lower extremity pain (Location of Secondary source of pain) (Right) (Chronic)   Relevant Medications   buPROPion (WELLBUTRIN XL) 300 MG 24 hr tablet   Chronic pain of left lower extremity (Chronic)   Relevant Medications   buPROPion (WELLBUTRIN XL) 300 MG 24 hr tablet   Other Relevant Orders   DG Lumbar Spine Complete W/Bend   Lumbar facet arthropathy (Chronic)   Relevant Orders   LUMBAR FACET(MEDIAL BRANCH NERVE BLOCK) MBNB   Lumbar facet syndrome (Location of Primary Source of Pain) (Bilateral) (R>L) (Chronic)    Relevant Orders   LUMBAR FACET(MEDIAL BRANCH NERVE BLOCK) MBNB   Lumbar spondylosis (Chronic)   Relevant Orders   DG Lumbar Spine Complete W/Bend   Osteoarthritis of hip (Right) (Chronic)     Medium   Chronic prescription benzodiazepine use - Primary (Chronic)   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    Lab-work & Procedure Ordered: Orders Placed This Encounter  Procedures  . LUMBAR FACET(MEDIAL BRANCH NERVE BLOCK) MBNB  . DG Lumbar Spine Complete W/Bend  . DG HIP UNILAT W OR W/O PELVIS 2-3 VIEWS LEFT  . DG HIP UNILAT W OR W/O PELVIS 2-3 VIEWS RIGHT    Imaging Ordered: None  Interventional Therapies: Scheduled:  Diagnostic bilateral lumbar facet block under fluoroscopic guidance and IV sedation.    Considering:  Lumbar facet radiofrequency ablation    PRN Procedures:  None at this time.    Referral(s) or Consult(s): Medical psychology consult for substance use disorder evaluation  Medications administered during this visit: Ms. Hastings had no medications administered during this visit.  Prescriptions ordered during this visit: New Prescriptions   No medications on file    Requested PM Follow-up: Return for Procedure (Scheduled).  No future appointments.   Primary Care Physician: Wanda Neighbors, MD Location: [facility] Note by: Wanda [name]. Wanda [name], M.D, DABA, DABAPM, DABPM, DABIPP, FIPP  Pain Score Disclaimer: We use the NRS-11 scale. This is a self-reported, subjective measurement of pain severity with only modest accuracy. It is used primarily to identify changes within a particular patient. It must be understood that outpatient pain scales are significantly less accurate that those used for research, where they can be applied under ideal controlled circumstances with minimal exposure to variables.  In reality, the score is likely to be a combination of pain intensity and pain affect,  where pain affect describes the degree of emotional arousal or changes in action readiness caused by the sensory experience of pain. Factors such as social and work situation, setting, emotional state, anxiety levels, expectation, and prior pain experience may influence pain perception and show large inter-individual differences that may also be affected by time variables.  Patient instructions provided during this appointment: Patient Instructions  Facet Blocks Patient Information  Description: The facets are joints in the spine between the vertebrae.  Like any joints in the body, facets can become irritated and painful.  Arthritis can also effect the facets.  By injecting steroids and local anesthetic in and around these joints, we can temporarily block the nerve supply to them.  Steroids act directly on irritated nerves and tissues to reduce selling and inflammation which often leads to decreased pain.  Facet blocks may be done anywhere along the spine from the neck to the low back depending upon the location of your pain.   After numbing the skin with local anesthetic (like Novocaine), a small needle is passed onto the facet joints under x-ray guidance.  You may experience a sensation of pressure while this is being done.  The entire block usually lasts about 15-25 minutes.   Conditions which may be treated by facet blocks:   Low back/buttock pain  Neck/shoulder pain  Certain types of headaches  Preparation for the injection:  1. Do not eat any solid food or dairy products within 8 hours of your appointment. 2. You may drink clear liquid up to 3 hours before appointment.  Clear liquids include water, black coffee, juice or soda.  No milk or cream please. 3. You may take your regular medication, including pain medications, with a sip of water before your appointment.  Diabetics should hold regular insulin  (if taken separately) and take 1/2 normal NPH dose the morning of the procedure.  Carry some sugar containing items with you to your appointment. 4. A driver must accompany you and be prepared to drive you home after your procedure. 5. Bring all your current medications with you. 6. An IV may be inserted and sedation may be given at the discretion of the physician. 7. A blood pressure cuff, EKG and other monitors will often be applied during the procedure.  Some patients may need to have extra oxygen administered for a short period. 8. You will be asked to provide medical information, including your allergies and medications, prior to the procedure.  We must know immediately if you are taking blood thinners (like Coumadin/Warfarin) or if you are allergic to IV iodine contrast (dye).  We must know if you could possible be pregnant.  Possible side-effects:   Bleeding from needle site  Infection (rare, may require surgery)  Nerve injury (rare)  Numbness & tingling (temporary)  Difficulty urinating (rare, temporary)  Spinal headache (a headache worse with upright posture)  Light-headedness (temporary)  Pain at injection site (serveral days)  Decreased blood pressure (rare, temporary)  Weakness in arm/leg (temporary)  Pressure sensation in back/neck (temporary)   Call if you experience:   Fever/chills associated with headache or increased back/neck pain  Headache worsened by an upright position  New onset, weakness or numbness of an extremity below the injection site  Hives or difficulty breathing (go to the emergency room)  Inflammation or drainage at the injection site(s)  Severe back/neck pain greater than usual  New symptoms which are concerning to you  Please note:  Although the local anesthetic injected can  often make your back or neck feel good for several hours after the injection, the pain will likely return. It takes 3-7 days for steroids to work.  You may not  notice any pain relief for at least one week.  If effective, we will often do a series of 2-3 injections spaced 3-6 weeks apart to maximally decrease your pain.  After the initial series, you may be a candidate for a more permanent nerve block of the facets.  If you have any questions, please call #[phone] [facility]

## 2015-10-18 NOTE — Telephone Encounter (Signed)
i placed the order in [facility] box to arrange for [employer]...td

## 2015-10-18 NOTE — Telephone Encounter (Signed)
Made pt aware that insure autho is required. Place order in Angie's box...td

## 2015-10-18 NOTE — Patient Instructions (Signed)
Facet Blocks Patient Information  Description: The facets are joints in the spine between the vertebrae.  Like any joints in the body, facets can become irritated and painful.  Arthritis can also effect the facets.  By injecting steroids and local anesthetic in and around these joints, we can temporarily block the nerve supply to them.  Steroids act directly on irritated nerves and tissues to reduce selling and inflammation which often leads to decreased pain.  Facet blocks may be done anywhere along the spine from the neck to the low back depending upon the location of your pain.   After numbing the skin with local anesthetic (like Novocaine), a small needle is passed onto the facet joints under x-ray guidance.  You may experience a sensation of pressure while this is being done.  The entire block usually lasts about 15-25 minutes.   Conditions which may be treated by facet blocks:   Low back/buttock pain  Neck/shoulder pain  Certain types of headaches  Preparation for the injection:  1. Do not eat any solid food or dairy products within 8 hours of your appointment. 2. You may drink clear liquid up to 3 hours before appointment.  Clear liquids include water, black coffee, juice or soda.  No milk or cream please. 3. You may take your regular medication, including pain medications, with a sip of water before your appointment.  Diabetics should hold regular insulin (if taken separately) and take 1/2 normal NPH dose the morning of the procedure.  Carry some sugar containing items with you to your appointment. 4. A driver must accompany you and be prepared to drive you home after your procedure. 5. Bring all your current medications with you. 6. An IV may be inserted and sedation may be given at the discretion of the physician. 7. A blood pressure cuff, EKG and other monitors will often be applied during the procedure.  Some patients may need to have extra oxygen administered for a short  period. 8. You will be asked to provide medical information, including your allergies and medications, prior to the procedure.  We must know immediately if you are taking blood thinners (like Coumadin/Warfarin) or if you are allergic to IV iodine contrast (dye).  We must know if you could possible be pregnant.  Possible side-effects:   Bleeding from needle site  Infection (rare, may require surgery)  Nerve injury (rare)  Numbness & tingling (temporary)  Difficulty urinating (rare, temporary)  Spinal headache (a headache worse with upright posture)  Light-headedness (temporary)  Pain at injection site (serveral days)  Decreased blood pressure (rare, temporary)  Weakness in arm/leg (temporary)  Pressure sensation in back/neck (temporary)   Call if you experience:   Fever/chills associated with headache or increased back/neck pain  Headache worsened by an upright position  New onset, weakness or numbness of an extremity below the injection site  Hives or difficulty breathing (go to the emergency room)  Inflammation or drainage at the injection site(s)  Severe back/neck pain greater than usual  New symptoms which are concerning to you  Please note:  Although the local anesthetic injected can often make your back or neck feel good for several hours after the injection, the pain will likely return. It takes 3-7 days for steroids to work.  You may not notice any pain relief for at least one week.  If effective, we will often do a series of 2-3 injections spaced 3-6 weeks apart to maximally decrease your pain.  After the initial   series, you may be a candidate for a more permanent nerve block of the facets.  If you have any questions, please call [phone] [facility] 

## 2015-10-29 ENCOUNTER — Telehealth: Payer: Self-pay

## 2015-10-29 ENCOUNTER — Telehealth: Payer: Self-pay | Admitting: *Deleted

## 2015-10-29 NOTE — Telephone Encounter (Signed)
Called with x-ray results. Patient states that she is planned to have lumbar facets but is not currently scheduled.  States that Dr Dossie Arbour told her that he would like for [employer] to be present for procedure.  Will check with Dr Dossie Arbour on Monday when he returns to office.  Patient verbalizes u/o information.

## 2015-10-29 NOTE — Telephone Encounter (Signed)
She wants to get her xray results.

## 2015-11-02 NOTE — Telephone Encounter (Signed)
Her insurance does not require a prior auth - it is okay to schedule for the procedure.   Please check with the front office staff concerning scheduling with a [employer] Rep and the procedure on the same day.  Thank you

## 2015-11-15 ENCOUNTER — Encounter: Payer: Self-pay | Admitting: Pain Medicine

## 2015-12-02 ENCOUNTER — Ambulatory Visit: Payer: 59 | Attending: Family Medicine

## 2015-12-09 ENCOUNTER — Ambulatory Visit
Admission: RE | Admit: 2015-12-09 | Discharge: 2015-12-09 | Disposition: A | Discharge status: Home / Self Care | Payer: BLUE CROSS/BLUE SHIELD | Source: Ambulatory Visit | Attending: Family Medicine | Admitting: Family Medicine

## 2015-12-09 DIAGNOSIS — Z1231 Encounter for screening mammogram for malignant neoplasm of breast: Secondary | ICD-10-CM | POA: Insufficient documentation

## 2016-01-29 ENCOUNTER — Other Ambulatory Visit: Payer: Self-pay | Admitting: Family Medicine

## 2016-01-29 ENCOUNTER — Ambulatory Visit
Admission: RE | Admit: 2016-01-29 | Discharge: 2016-01-29 | Disposition: A | Discharge status: Home / Self Care | Payer: Medicare Other | Source: Ambulatory Visit | Attending: Family Medicine | Admitting: Family Medicine

## 2016-01-29 DIAGNOSIS — R937 Abnormal findings on diagnostic imaging of other parts of musculoskeletal system: Secondary | ICD-10-CM | POA: Diagnosis not present

## 2016-01-29 DIAGNOSIS — M25532 Pain in left wrist: Secondary | ICD-10-CM | POA: Insufficient documentation

## 2016-02-02 ENCOUNTER — Ambulatory Visit: Payer: Medicare Other | Attending: Pain Medicine | Admitting: Pain Medicine

## 2016-02-02 ENCOUNTER — Telehealth: Payer: Self-pay | Admitting: *Deleted

## 2016-02-02 ENCOUNTER — Encounter: Payer: Self-pay | Admitting: Pain Medicine

## 2016-02-02 VITALS — BP 116/80 | HR 84 | Temp 98.1°F | Resp 16 | Ht 61.0 in | Wt 146.0 lb

## 2016-02-02 DIAGNOSIS — Z9689 Presence of other specified functional implants: Secondary | ICD-10-CM

## 2016-02-02 DIAGNOSIS — M25551 Pain in right hip: Secondary | ICD-10-CM | POA: Diagnosis not present

## 2016-02-02 DIAGNOSIS — M545 Low back pain: Secondary | ICD-10-CM | POA: Diagnosis not present

## 2016-02-02 DIAGNOSIS — M47816 Spondylosis without myelopathy or radiculopathy, lumbar region: Secondary | ICD-10-CM

## 2016-02-02 DIAGNOSIS — M542 Cervicalgia: Secondary | ICD-10-CM

## 2016-02-02 DIAGNOSIS — Z79899 Other long term (current) drug therapy: Secondary | ICD-10-CM

## 2016-02-02 DIAGNOSIS — M79604 Pain in right leg: Secondary | ICD-10-CM | POA: Diagnosis not present

## 2016-02-02 DIAGNOSIS — G8929 Other chronic pain: Secondary | ICD-10-CM | POA: Insufficient documentation

## 2016-02-02 DIAGNOSIS — M1611 Unilateral primary osteoarthritis, right hip: Secondary | ICD-10-CM

## 2016-02-02 NOTE — Patient Instructions (Signed)
GENERAL RISKS AND COMPLICATIONS  What are the risk, side effects and possible complications? Generally speaking, most procedures are safe.  However, with any procedure there are risks, side effects, and the possibility of complications.  The risks and complications are dependent upon the sites that are lesioned, or the type of nerve block to be performed.  The closer the procedure is to the spine, the more serious the risks are.  Great care is taken when placing the radio frequency needles, block needles or lesioning probes, but sometimes complications can occur. 1. Infection: Any time there is an injection through the skin, there is a risk of infection.  This is why sterile conditions are used for these blocks.  There are four possible types of infection. 1. Localized skin infection. 2. Central Nervous System Infection-This can be in the form of Meningitis, which can be deadly. 3. Epidural Infections-This can be in the form of an epidural abscess, which can cause pressure inside of the spine, causing compression of the spinal cord with subsequent paralysis. This would require an emergency surgery to decompress, and there are no guarantees that the patient would recover from the paralysis. 4. Discitis-This is an infection of the intervertebral discs.  It occurs in about 1% of discography procedures.  It is difficult to treat and it may lead to surgery.        2. Pain: the needles have to go through skin and soft tissues, will cause soreness.       3. Damage to internal structures:  The nerves to be lesioned may be near blood vessels or    other nerves which can be potentially damaged.       4. Bleeding: Bleeding is more common if the patient is taking blood thinners such as  aspirin, Coumadin, Ticiid, Plavix, etc., or if he/she have some genetic predisposition  such as hemophilia. Bleeding into the spinal canal can cause compression of the spinal  cord with subsequent paralysis.  This would require an  emergency surgery to  decompress and there are no guarantees that the patient would recover from the  paralysis.       5. Pneumothorax:  Puncturing of a lung is a possibility, every time a needle is introduced in  the area of the chest or upper back.  Pneumothorax refers to free air around the  collapsed lung(s), inside of the thoracic cavity (chest cavity).  Another two possible  complications related to a similar event would include: Hemothorax and Chylothorax.   These are variations of the Pneumothorax, where instead of air around the collapsed  lung(s), you may have blood or chyle, respectively.       6. Spinal headaches: They may occur with any procedures in the area of the spine.       7. Persistent CSF (Cerebro-Spinal Fluid) leakage: This is a rare problem, but may occur  with prolonged intrathecal or epidural catheters either due to the formation of a fistulous  track or a dural tear.       8. Nerve damage: By working so close to the spinal cord, there is always a possibility of  nerve damage, which could be as serious as a permanent spinal cord injury with  paralysis.       9. Death:  Although rare, severe deadly allergic reactions known as "Anaphylactic  reaction" can occur to any of the medications used.      10. Worsening of the symptoms:  We can always make thing worse.    What are the chances of something like this happening? Chances of any of this occuring are extremely low.  By statistics, you have more of a chance of getting killed in a motor vehicle accident: while driving to the hospital than any of the above occurring .  Nevertheless, you should be aware that they are possibilities.  In general, it is similar to taking a shower.  Everybody knows that you can slip, hit your head and get killed.  Does that mean that you should not shower again?  Nevertheless always keep in mind that statistics do not mean anything if you happen to be on the wrong side of them.  Even if a procedure has a 1  (one) in a 1,000,000 (million) chance of going wrong, it you happen to be that one..Also, keep in mind that by statistics, you have more of a chance of having something go wrong when taking medications.  Who should not have this procedure? If you are on a blood thinning medication (e.g. Coumadin, Plavix, see list of "Blood Thinners"), or if you have an active infection going on, you should not have the procedure.  If you are taking any blood thinners, please inform your physician.  How should I prepare for this procedure?  Do not eat or drink anything at least six hours prior to the procedure.  Bring a driver with you .  It cannot be a taxi.  Come accompanied by an adult that can drive you back, and that is strong enough to help you if your legs get weak or numb from the local anesthetic.  Take all of your medicines the morning of the procedure with just enough water to swallow them.  If you have diabetes, make sure that you are scheduled to have your procedure done first thing in the morning, whenever possible.  If you have diabetes, take only half of your insulin dose and notify our nurse that you have done so as soon as you arrive at the clinic.  If you are diabetic, but only take blood sugar pills (oral hypoglycemic), then do not take them on the morning of your procedure.  You may take them after you have had the procedure.  Do not take aspirin or any aspirin-containing medications, at least eleven (11) days prior to the procedure.  They may prolong bleeding.  Wear loose fitting clothing that may be easy to take off and that you would not mind if it got stained with Betadine or blood.  Do not wear any jewelry or perfume  Remove any nail coloring.  It will interfere with some of our monitoring equipment.  NOTE: Remember that this is not meant to be interpreted as a complete list of all possible complications.  Unforeseen problems may occur.  BLOOD THINNERS The following drugs  contain aspirin or other products, which can cause increased bleeding during surgery and should not be taken for 2 weeks prior to and 1 week after surgery.  If you should need take something for relief of minor pain, you may take acetaminophen which is found in Tylenol,m Datril, Anacin-3 and Panadol. It is not blood thinner. The products listed below are.  Do not take any of the products listed below in addition to any listed on your instruction sheet.  A.P.C or A.P.C with Codeine Codeine Phosphate Capsules #3 Ibuprofen Ridaura  ABC compound Congesprin Imuran rimadil  Advil Cope Indocin Robaxisal  Alka-Seltzer Effervescent Pain Reliever and Antacid Coricidin or Coricidin-D  Indomethacin Rufen    Alka-Seltzer plus Cold Medicine Cosprin Ketoprofen S-A-C Tablets  Anacin Analgesic Tablets or Capsules Coumadin Korlgesic Salflex  Anacin Extra Strength Analgesic tablets or capsules CP-2 Tablets Lanoril Salicylate  Anaprox Cuprimine Capsules Levenox Salocol  Anexsia-D Dalteparin Magan Salsalate  Anodynos Darvon compound Magnesium Salicylate Sine-off  Ansaid Dasin Capsules Magsal Sodium Salicylate  Anturane Depen Capsules Marnal Soma  APF Arthritis pain formula Dewitt's Pills Measurin Stanback  Argesic Dia-Gesic Meclofenamic Sulfinpyrazone  Arthritis Bayer Timed Release Aspirin Diclofenac Meclomen Sulindac  Arthritis pain formula Anacin Dicumarol Medipren Supac  Analgesic (Safety coated) Arthralgen Diffunasal Mefanamic Suprofen  Arthritis Strength Bufferin Dihydrocodeine Mepro Compound Suprol  Arthropan liquid Dopirydamole Methcarbomol with Aspirin Synalgos  ASA tablets/Enseals Disalcid Micrainin Tagament  Ascriptin Doan's Midol Talwin  Ascriptin A/D Dolene Mobidin Tanderil  Ascriptin Extra Strength Dolobid Moblgesic Ticlid  Ascriptin with Codeine Doloprin or Doloprin with Codeine Momentum Tolectin  Asperbuf Duoprin Mono-gesic Trendar  Aspergum Duradyne Motrin or Motrin IB Triminicin  Aspirin  plain, buffered or enteric coated Durasal Myochrisine Trigesic  Aspirin Suppositories Easprin Nalfon Trillsate  Aspirin with Codeine Ecotrin Regular or Extra Strength Naprosyn Uracel  Atromid-S Efficin Naproxen Ursinus  Auranofin Capsules Elmiron Neocylate Vanquish  Axotal Emagrin Norgesic Verin  Azathioprine Empirin or Empirin with Codeine Normiflo Vitamin E  Azolid Emprazil Nuprin Voltaren  Bayer Aspirin plain, buffered or children's or timed BC Tablets or powders Encaprin Orgaran Warfarin Sodium  Buff-a-Comp Enoxaparin Orudis Zorpin  Buff-a-Comp with Codeine Equegesic Os-Cal-Gesic   Buffaprin Excedrin plain, buffered or Extra Strength Oxalid   Bufferin Arthritis Strength Feldene Oxphenbutazone   Bufferin plain or Extra Strength Feldene Capsules Oxycodone with Aspirin   Bufferin with Codeine Fenoprofen Fenoprofen Pabalate or Pabalate-SF   Buffets II Flogesic Panagesic   Buffinol plain or Extra Strength Florinal or Florinal with Codeine Panwarfarin   Buf-Tabs Flurbiprofen Penicillamine   Butalbital Compound Four-way cold tablets Penicillin   Butazolidin Fragmin Pepto-Bismol   Carbenicillin Geminisyn Percodan   Carna Arthritis Reliever Geopen Persantine   Carprofen Gold's salt Persistin   Chloramphenicol Goody's Phenylbutazone   Chloromycetin Haltrain Piroxlcam   Clmetidine heparin Plaquenil   Cllnoril Hyco-pap Ponstel   Clofibrate Hydroxy chloroquine Propoxyphen         Before stopping any of these medications, be sure to consult the physician who ordered them.  Some, such as Coumadin (Warfarin) are ordered to prevent or treat serious conditions such as "deep thrombosis", "pumonary embolisms", and other heart problems.  The amount of time that you may need off of the medication may also vary with the medication and the reason for which you were taking it.  If you are taking any of these medications, please make sure you notify your pain physician before you undergo any  procedures.         Facet Blocks Patient Information  Description: The facets are joints in the spine between the vertebrae.  Like any joints in the body, facets can become irritated and painful.  Arthritis can also effect the facets.  By injecting steroids and local anesthetic in and around these joints, we can temporarily block the nerve supply to them.  Steroids act directly on irritated nerves and tissues to reduce selling and inflammation which often leads to decreased pain.  Facet blocks may be done anywhere along the spine from the neck to the low back depending upon the location of your pain.   After numbing the skin with local anesthetic (like Novocaine), a small needle is passed onto the facet joints under x-ray guidance.    You may experience a sensation of pressure while this is being done.  The entire block usually lasts about 15-25 minutes.   Conditions which may be treated by facet blocks:   Low back/buttock pain  Neck/shoulder pain  Certain types of headaches  Preparation for the injection:  1. Do not eat any solid food or dairy products within 8 hours of your appointment. 2. You may drink clear liquid up to 3 hours before appointment.  Clear liquids include water, black coffee, juice or soda.  No milk or cream please. 3. You may take your regular medication, including pain medications, with a sip of water before your appointment.  Diabetics should hold regular insulin (if taken separately) and take 1/2 normal NPH dose the morning of the procedure.  Carry some sugar containing items with you to your appointment. 4. A driver must accompany you and be prepared to drive you home after your procedure. 5. Bring all your current medications with you. 6. An IV may be inserted and sedation may be given at the discretion of the physician. 7. A blood pressure cuff, EKG and other monitors will often be applied during the procedure.  Some patients may need to have extra oxygen  administered for a short period. 8. You will be asked to provide medical information, including your allergies and medications, prior to the procedure.  We must know immediately if you are taking blood thinners (like Coumadin/Warfarin) or if you are allergic to IV iodine contrast (dye).  We must know if you could possible be pregnant.  Possible side-effects:   Bleeding from needle site  Infection (rare, may require surgery)  Nerve injury (rare)  Numbness & tingling (temporary)  Difficulty urinating (rare, temporary)  Spinal headache (a headache worse with upright posture)  Light-headedness (temporary)  Pain at injection site (serveral days)  Decreased blood pressure (rare, temporary)  Weakness in arm/leg (temporary)  Pressure sensation in back/neck (temporary)   Call if you experience:   Fever/chills associated with headache or increased back/neck pain  Headache worsened by an upright position  New onset, weakness or numbness of an extremity below the injection site  Hives or difficulty breathing (go to the emergency room)  Inflammation or drainage at the injection site(s)  Severe back/neck pain greater than usual  New symptoms which are concerning to you  Please note:  Although the local anesthetic injected can often make your back or neck feel good for several hours after the injection, the pain will likely return. It takes 3-7 days for steroids to work.  You may not notice any pain relief for at least one week.  If effective, we will often do a series of 2-3 injections spaced 3-6 weeks apart to maximally decrease your pain.  After the initial series, you may be a candidate for a more permanent nerve block of the facets.  If you have any questions, please call #[phone] [place] FOR 8 HOURS PRIOR TO PROCEDURE BRING A DRIVER

## 2016-02-02 NOTE — Progress Notes (Signed)
Safety precautions to be maintained throughout the outpatient stay will include: orient to surroundings, keep bed in low position, maintain call bell within reach at all times, provide assistance with transfer out of bed and ambulation.  Patient would like to discuss getting Spinal Cord stimulator removed

## 2016-02-02 NOTE — Progress Notes (Signed)
Patient's Name: Wanda [name]  MRN: [id number]  Referring Provider: Celene Squibb, MD  DOB: [birth year]  PCP: [name], MD  DOS: 02/02/2016  Note by: Kathlen Brunswick. Dossie Arbour, MD  Service setting: Ambulatory outpatient  Specialty: Interventional Pain Management  Location: [facility] (AMB) Pain Management Facility    Patient type: Established   Primary Reason(s) for Visit: Encounter for evaluation before starting new chronic pain management plan of care (Level of risk: moderate) CC: Back Pain (low)   HPI  Wanda [name] is a 60 y.o. year old, female patient, who returns today as an established patient. 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 Her primarily concern today is the Back Pain (low)   Pain Assessment: Self-Reported Pain Score: 7  Clinically the patient looks like a 1/10 Reported level is inconsistent with clinical obrservations Information on the proper use of the pain score provided to the patient today. Pain Type: Chronic pain Pain Location: Back Pain Orientation: Lower Pain Descriptors / Indicators: Sharp, Dull Pain Frequency: Constant  The patient comes  into the clinics today for post-procedure evaluation on the interventional treatment done on 10/18/2015. In addition, she comes in today for pharmacological management of her chronic pain.  The patient  reports that she does not use drugs. The patient returns to the clinics today to ask some questions regarding the possibility of removing her spinal cord stimulator. According to her, she is now on disability and does not need the stimulator anymore. Today I had a long conversation with her with regards to the device and the options that she has. She has decided to further think about those options and she indicated that she will contact me again if she decides to have it removed.  Date of Last Visit: 10/18/15 Service Provided on Last Visit: Evaluation (new patient)  Controlled Substance Pharmacotherapy Assessment & REMS (Risk Evaluation and Mitigation Strategy)  Analgesic: She indicates currently not taking any opioids. MME/day: 0 mg/day.  Opioid Risk Tool (ORT) Score: Total Score: 11 High Risk for Opioid Abuse (Score >8) Depression Scale Score: PHQ-2: PHQ-2 Total Score: 1 15.4% Probability of major depressive disorder (1) PHQ-9: PHQ-9 Total Score: 1 No depression (0-4)  Laboratory Chemistry  Inflammation Markers No results found for: ESRSEDRATE, CRP  Renal Function Lab Results  Component Value Date   BUN 16 12/04/2012   CREATININE 0.64 12/04/2012   GFRAA >60 12/04/2012   GFRNONAA >60 12/04/2012    Hepatic Function Lab Results  Component Value Date   AST 25 02/01/2010   ALT 17 02/01/2010   ALBUMIN 3.7 02/01/2010    Electrolytes Lab Results  Component Value Date   NA 140 12/04/2012   K 3.8 12/04/2012   CL 107 12/04/2012   CALCIUM 9.4 12/04/2012    Pain Modulating Vitamins No results found for: [place], VD125OH2TOT, [id number], [id number], 25OHVITD1, 25OHVITD2, 25OHVITD3, VITAMINB12  Coagulation Parameters  Lab Results  Component Value Date   PLT 269 12/04/2012     Cardiovascular Lab Results  Component Value Date   HGB 12.1 12/04/2012   HCT 34.2 (L) 12/04/2012   Note: Lab results reviewed.  Recent Diagnostic Imaging  Dg Wrist Complete Left Result Date: 01/29/2016 CLINICAL DATA:  Fall on outstretched left hand EXAM: LEFT WRIST - COMPLETE 3+ VIEW COMPARISON:  None. FINDINGS: Mild cortical irregularity involving the radial styloid on the oblique view. However, this appears intact on the frontal radiograph. Correlate for point tenderness. Degenerative changes at the 1st carpometacarpal joint. Visualized soft tissues are within normal limits. IMPRESSION: Mild cortical irregularity involving the radial styloid, although only visualized on a single view. Correlate for point tenderness to exclude subtle nondisplaced fracture. In lung in Electronically Signed   By: Julian Hy M.D.   On: 01/29/2016 13:16   Lumbosacral Imaging: Lumbar MR w/wo contrast:  Results for orders placed during the hospital encounter of 08/07/12  MR Lumbar Spine W Wo Contrast   Narrative *RADIOLOGY REPORT*  Clinical Data: Increasing low back pain with bilateral hip and leg pain, weakness, and numbness. Increasing hypersensitivity in the left L4-5 nerve root distribution.  Decreased range of motion.   MRI LUMBAR SPINE WITHOUT AND WITH CONTRAST  Technique:  Multiplanar and multiecho pulse sequences of the lumbar spine were obtained without and with intravenous contrast.  Contrast: 31mL MULTIHANCE GADOBENATE DIMEGLUMINE 529 MG/ML IV SOLN  Comparison: Lumbar MRIs dated 08/02/2010 and 01/08/2009   Findings: Normal conus tip is at L2.  Normal paraspinal soft tissues.  T11-12 through L2-3:  No significant abnormalities.  Old Schmorl's node in the superior plate of L1, unchanged.  Minimal disc bulging and osteophytes to the right at T12-L1 with no impingement, unchanged.  L3-4:  Tiny central disc bulge with new desiccation of the disc. No neural impingement.  Prior  right hemilaminotomy. Slight degenerative changes of the facet joints, right greater than left, minimally progressed.  L4-5:  Chronic marked disc space narrowing.  Tiny central disc bulge and osteophyte touching the ventral aspect of the thecal sac, unchanged, without neural impingement.  Previous laminotomies.  No neural impingement.  No pathologic enhancement after contrast.  L5-S1:  Previous left laminotomy. Disc bulge lateral to the right neural foramen which appears to have a mass effect upon the right L5 nerve lateral to the neural foramen.  Far lateral osteophyte formation to the left without impingement upon the left L5 nerve. These findings are unchanged. Slight right facet arthritis, unchanged.  IMPRESSION: 1.  Slight progression of mild degenerative facet arthritis at L3-4 on the right. 2.  Slight new degeneration of the L3-4 disc with a tiny central bulge with no neural impingement. 3.  Chronic right far lateral disc bulge at L5-S1 which could affect the right L5 nerve, unchanged since 01/08/2009.   Original Report Authenticated By: Lorriane Shire, M.D.    Lumbar DG Bending views:  Results for orders placed during the hospital encounter of 10/18/15  DG Lumbar Spine Complete W/Bend   Narrative CLINICAL DATA:  Right-sided low back pain, fell many years ago, no recent trauma  EXAM: LUMBAR SPINE - COMPLETE WITH BENDING VIEWS  COMPARISON:  MR lumbar spine of 08/07/2012, and lumbar spine plain films of 06/14/2010  FINDINGS: The lumbar vertebrae remain in normal alignment. There is degenerative disc disease most marked at L4-5 which has not changed significantly compared to the lateral lumbar spine film from 2012. A mild compression deformity of L1 appears old  with degenerative spurring present. No acute abnormality is seen. Through flexion and extension there is very limited range of motion with no malalignment. Degenerate changes noted involve the facet  joints particularly of L4-5 and L5-S1. A neurostimulator battery pack overlies the left buttock with electrodes extending cephalad into the lower thoracic spinal canal.  IMPRESSION: 1. Normal alignment with little change in degenerative disc disease particularly at L4-5. 2. Old mild compression deformity of L1. 3. Very limited range of motion through flexion and extension.   Electronically Signed   By: Ivar Drape M.D.   On: 10/19/2015 08:30    Hip Imaging: Hip-R DG 2-3 views:  Results for orders placed during the hospital encounter of 10/18/15  DG HIP UNILAT W OR W/O PELVIS 2-3 VIEWS RIGHT   Narrative CLINICAL DATA:  Low back pain, right greater than left, no acute trauma  EXAM: DG HIP (WITH OR WITHOUT PELVIS) 2-3V RIGHT  COMPARISON:  Pelvis and hip films of 08/07/2012  FINDINGS: There are degenerative changes in both hips right greater than left with slightly more loss of joint space on the right and spurring. No acute fracture is seen. The pelvic rami are intact. The SI joints are corticated.  IMPRESSION: Degenerative joint disease of the hips, right greater than left. No acute abnormality.   Electronically Signed   By: Ivar Drape M.D.   On: 10/19/2015 08:32    Hip-L DG 2-3 views:  Results for orders placed during the hospital encounter of 10/18/15  DG HIP UNILAT W OR W/O PELVIS 2-3 VIEWS LEFT   Narrative CLINICAL DATA:  Low back pain, more on the right, no recent trauma  EXAM: DG HIP (WITH OR WITHOUT PELVIS) 2-3V LEFT  COMPARISON:  None.  FINDINGS: There is mild degenerative joint disease involving both hips right slightly more so than left. The pelvic rami are intact. No acute abnormality is seen. The SI joints are corticated.  IMPRESSION: Degenerative joint disease of the hips right greater than left. No acute abnormality.   Electronically Signed   By: Ivar Drape M.D.   On: 10/19/2015 08:31    Note: Imaging results reviewed.  Meds  The  patient has a current medication list which includes the following prescription(s): alprazolam, atorvastatin, bupropion, buspirone, nadolol, pantoprazole, prazosin, and valacyclovir.  Current Outpatient Prescriptions on File Prior to Visit  Medication Sig  . atorvastatin (LIPITOR) 40 MG tablet Take 40 mg by mouth daily.  Marland Kitchen buPROPion (WELLBUTRIN XL) 300 MG 24 hr tablet Take 300 mg by mouth daily.  . busPIRone (BUSPAR) 15 MG tablet Take 15 mg by mouth 2 (two) times daily.  . nadolol (CORGARD) 20 MG tablet Take 20 mg by mouth daily.    . pantoprazole (PROTONIX) 40 MG tablet Take 40 mg by mouth 2 (two) times daily.   . valACYclovir (VALTREX) 1000 MG tablet Take 1 tablet (1,000 mg total) by mouth daily.   No current facility-administered medications on file prior to visit.     ROS  Constitutional: Denies any fever or chills Gastrointestinal: No reported hemesis, hematochezia, vomiting, or acute GI distress Musculoskeletal: Denies any acute onset joint swelling, redness, loss of ROM, or weakness Neurological: No reported episodes of acute onset apraxia, aphasia, dysarthria, agnosia, amnesia, paralysis, loss of coordination, or loss of consciousness  Allergies  Ms. Shrode is allergic to codeine; levofloxacin; and propoxyphene hcl.  [place]  Medical:  Ms. Helbling  has a past medical history of Anxiety; BRONCHITIS, ACUTE WITH BRONCHOSPASM (06/25/2010); Depression; GASTROENTERITIS WITHOUT  DEHYDRATION (05/14/2009); GERD (gastroesophageal reflux disease); Herpes; Hyperlipidemia; and Irregular heart beat. Family: family history includes Alcohol abuse in her daughter; COPD in her father and mother; Cancer in her cousin and paternal aunt; Depression in her daughter; Diabetes in her cousin, father, mother, paternal grandmother, and sister; Heart disease in her father and mother; Tuberculosis in her maternal aunt. Surgical:  has a past surgical history that includes Shoulder surgery; Appendectomy; Abdominal  hysterectomy; Back surgery; Spinal cord stimulator; Colonoscopy (N/A, 08/28/2013); and Breast excisional biopsy (Right). Tobacco:  reports that she has quit smoking. Her smoking use included Cigarettes. She has a 3.50 pack-year smoking history. She quit smokeless tobacco use about 25 years ago. Alcohol:  reports that she drinks alcohol. Drug:  reports that she does not use drugs.  Constitutional Exam  Vitals: Blood pressure 116/80, pulse 84, temperature 98.1 F (36.7 C), resp. rate 16, height 5\' 1"  (1.549 m), weight 146 lb (66.2 kg), SpO2 100 %. General appearance: Well nourished, well developed, and well hydrated. In no acute distress Calculated BMI/Body habitus: Body mass index is 27.59 kg/m. (25-29.9 kg/m2) Overweight - 20% higher incidence of chronic pain Psych/Mental status: Alert and oriented x 3 (person, place, & time) Eyes: PERLA Respiratory: No evidence of acute respiratory distress  Cervical Spine Exam  Inspection: No masses, redness, or swelling Alignment: Symmetrical Functional ROM: ROM appears unrestricted Stability: No instability detected Muscle strength & Tone: Functionally intact Sensory: Unimpaired Palpation: Non-contributory  Upper Extremity (UE) Exam    Side: Right upper extremity  Side: Left upper extremity  Inspection: No masses, redness, swelling, or asymmetry  Inspection: No masses, redness, swelling, or asymmetry  Functional ROM: ROM appears unrestricted          Functional ROM: ROM appears unrestricted          Muscle strength & Tone: Functionally intact  Muscle strength & Tone: Functionally intact  Sensory: Unimpaired  Sensory: Unimpaired  Palpation: Non-contributory  Palpation: Non-contributory   Thoracic Spine Exam  Inspection: No masses, redness, or swelling Alignment: Symmetrical Functional ROM: ROM appears unrestricted Stability: No instability detected Sensory: Unimpaired Muscle strength & Tone: Functionally intact Palpation:  Non-contributory  Lumbar Spine Exam  Inspection: Well healed scar from previous spine surgery detected Alignment: Symmetrical Functional ROM: ROM appears unrestricted Stability: No instability detected Muscle strength & Tone: Functionally intact Sensory: Unimpaired Palpation: Non-contributory Provocative Tests: Lumbar Hyperextension and rotation test: evaluation deferred today       Patrick's Maneuver: evaluation deferred today              Gait & Posture Assessment  Ambulation: Unassisted Gait: Relatively normal for age and body habitus Posture: WNL   Lower Extremity Exam    Side: Right lower extremity  Side: Left lower extremity  Inspection: No masses, redness, swelling, or asymmetry  Inspection: No masses, redness, swelling, or asymmetry  Functional ROM: ROM appears unrestricted          Functional ROM: ROM appears unrestricted          Muscle strength & Tone: Functionally intact  Muscle strength & Tone: Functionally intact  Sensory: Unimpaired  Sensory: Unimpaired  Palpation: Non-contributory  Palpation: Non-contributory    Assessment & Plan  Primary Diagnosis & Pertinent Problem List: The primary encounter diagnosis was Chronic pain. 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  Visit Diagnosis: 1. Chronic pain   2. Chronic low back pain (Location of Primary Source of Pain) (Bilateral) (R>L)   3. Chronic hip pain (Location of Secondary source of pain) (Right)   4. Chronic lower extremity pain (Location of Secondary source of pain) (Right)   5.  Lumbar facet syndrome (Location of Primary Source of Pain) (Bilateral) (R>L)   6. Primary osteoarthritis of right hip   7. 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 Chronic prescription benzodiazepine use     Problems updated and reviewed during this visit: Problem  BRONCHITIS, ACUTE WITH BRONCHOSPASM (Resolved)   Qualifier: Diagnosis of  By: Alveta Heimlich MD, Clydene Fake WITHOUT DEHYDRATION (Resolved)   Qualifier: Diagnosis of  By: Deborra Medina MD, Talia       Problem-specific Plan(s): No problem-specific Assessment & Plan notes found for this encounter.  No new Assessment & Plan notes have been filed under this hospital service since the last note was generated. Service: Pain Management   Plan of Care   Problem List Items Addressed This Visit      High   Chronic hip pain (Location of Secondary source of pain) (Right) (Chronic)   Chronic low back pain (Location of Primary Source of Pain) (Bilateral) (R>L) (Chronic)   Chronic lower extremity pain (Location of Secondary source of pain) (Right) (Chronic)   Chronic pain - Primary (Chronic)   Lumbar facet syndrome (Location of Primary Source of Pain) (Bilateral) (R>L) (Chronic)   Relevant Orders   LUMBAR FACET(MEDIAL BRANCH NERVE BLOCK) MBNB   Osteoarthritis of hip (Right) (Chronic)   Relevant Orders   HIP INJECTION     Medium   Chronic prescription benzodiazepine use (Chronic)   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      Pharmacotherapy (Medications Ordered): No orders of the defined types were placed in this encounter.   Lab-work & Procedure Ordered: Orders Placed This Encounter  Procedures  . LUMBAR FACET(MEDIAL BRANCH NERVE BLOCK) MBNB  . HIP INJECTION    Imaging Ordered: None  Interventional Therapies: Scheduled:  Diagnostic bilateral lumbar facet block under fluoroscopic guidance and IV sedation.    Considering:   Lumbar facet radiofrequency ablation    PRN Procedures:  None at this time.    Referral(s) or Consult(s): None at this time.  New Prescriptions   No medications on file    Medications administered during this visit: Ms. Lauwers had no medications administered during this visit.  Requested PM Follow-up: Return in 2 weeks (on 02/16/2016), or if symptoms worsen or fail to improve, for Arrange for [employer] appointment to reprogram SCS. In addition, (PRN) Procedure.  Future Appointments Date Time Provider [place]  02/22/2016 8:30 AM Milinda Pointer, MD [facility] None    Primary Care Physician: [name], MD Location: [facility] Outpatient Pain Management Facility Note by: Kathlen Brunswick. Dossie Arbour, M.D, DABA, DABAPM, DABPM, DABIPP, FIPP  Pain Score Disclaimer: We use the NRS-11 scale. This is a self-reported, subjective measurement of pain severity with only modest accuracy. It is used primarily to identify changes within a particular patient. It must be understood that outpatient pain scales are significantly less accurate that those used for research, where they can be applied under  ideal controlled circumstances with minimal exposure to variables. In reality, the score is likely to be a combination of pain intensity and pain affect, where pain affect describes the degree of emotional arousal or changes in action readiness caused by the sensory experience of pain. Factors such as social and work situation, setting, emotional state, anxiety levels, expectation, and prior pain experience may influence pain perception and show large inter-individual differences that may also be affected by time variables.  Patient instructions provided during this appointment: Patient Instructions   GENERAL RISKS AND COMPLICATIONS  What are the risk, side effects and possible complications? Generally speaking, most procedures are safe.  However, with any procedure there are risks, side effects,  and the possibility of complications.  The risks and complications are dependent upon the sites that are lesioned, or the type of nerve block to be performed.  The closer the procedure is to the spine, the more serious the risks are.  Great care is taken when placing the radio frequency needles, block needles or lesioning probes, but sometimes complications can occur. 1. Infection: Any time there is an injection through the skin, there is a risk of infection.  This is why sterile conditions are used for these blocks.  There are four possible types of infection. 1. Localized skin infection. 2. Central Nervous System Infection-This can be in the form of Meningitis, which can be deadly. 3. Epidural Infections-This can be in the form of an epidural abscess, which can cause pressure inside of the spine, causing compression of the spinal cord with subsequent paralysis. This would require an emergency surgery to decompress, and there are no guarantees that the patient would recover from the paralysis. 4. Discitis-This is an infection of the intervertebral discs.  It occurs in about 1% of discography procedures.  It is difficult to treat and it may lead to surgery.        2. Pain: the needles have to go through skin and soft tissues, will cause soreness.       3. Damage to internal structures:  The nerves to be lesioned may be near blood vessels or    other nerves which can be potentially damaged.       4. Bleeding: Bleeding is more common if the patient is taking blood thinners such as  aspirin, Coumadin, Ticiid, Plavix, etc., or if he/she have some genetic predisposition  such as hemophilia. Bleeding into the spinal canal can cause compression of the spinal  cord with subsequent paralysis.  This would require an emergency surgery to  decompress and there are no guarantees that the patient would recover from the  paralysis.       5. Pneumothorax:  Puncturing of a lung is a possibility, every time a needle is  introduced in  the area of the chest or upper back.  Pneumothorax refers to free air around the  collapsed lung(s), inside of the thoracic cavity (chest cavity).  Another two possible  complications related to a similar event would include: Hemothorax and Chylothorax.   These are variations of the Pneumothorax, where instead of air around the collapsed  lung(s), you may have blood or chyle, respectively.       6. Spinal headaches: They may occur with any procedures in the area of the spine.       7. Persistent CSF (Cerebro-Spinal Fluid) leakage: This is a rare problem, but may occur  with prolonged intrathecal or epidural catheters either due to the formation of a fistulous  track or a dural tear.       8. Nerve damage: By working so close to the spinal cord, there is always a possibility of  nerve damage, which could be as serious as a permanent spinal cord injury with  paralysis.       9. Death:  Although rare, severe deadly allergic reactions known as "Anaphylactic  reaction" can occur to any of the medications used.      10. Worsening of the symptoms:  We can always make thing worse.  What are the chances of something like this happening? Chances of any of this occuring are extremely low.  By statistics, you have more of a chance of getting killed in a motor vehicle accident: while driving to the hospital than any of the above occurring .  Nevertheless, you should be aware that they are possibilities.  In general, it is similar to taking a shower.  Everybody knows that you can slip, hit your head and get killed.  Does that mean that you should not shower again?  Nevertheless always keep in mind that statistics do not mean anything if you happen to be on the wrong side of them.  Even if a procedure has a 1 (one) in a 1,000,000 (million) chance of going wrong, it you happen to be that one..Also, keep in mind that by statistics, you have more of a chance of having something go wrong when taking  medications.  Who should not have this procedure? If you are on a blood thinning medication (e.g. Coumadin, Plavix, see list of "Blood Thinners"), or if you have an active infection going on, you should not have the procedure.  If you are taking any blood thinners, please inform your physician.  How should I prepare for this procedure?  Do not eat or drink anything at least six hours prior to the procedure.  Bring a driver with you .  It cannot be a taxi.  Come accompanied by an adult that can drive you back, and that is strong enough to help you if your legs get weak or numb from the local anesthetic.  Take all of your medicines the morning of the procedure with just enough water to swallow them.  If you have diabetes, make sure that you are scheduled to have your procedure done first thing in the morning, whenever possible.  If you have diabetes, take only half of your insulin dose and notify our nurse that you have done so as soon as you arrive at the clinic.  If you are diabetic, but only take blood sugar pills (oral hypoglycemic), then do not take them on the morning of your procedure.  You may take them after you have had the procedure.  Do not take aspirin or any aspirin-containing medications, at least eleven (11) days prior to the procedure.  They may prolong bleeding.  Wear loose fitting clothing that may be easy to take off and that you would not mind if it got stained with Betadine or blood.  Do not wear any jewelry or perfume  Remove any nail coloring.  It will interfere with some of our monitoring equipment.  NOTE: Remember that this is not meant to be interpreted as a complete list of all possible complications.  Unforeseen problems may occur.  BLOOD THINNERS The following drugs contain aspirin or other products, which can cause increased bleeding during surgery and should not be taken for 2 weeks prior to and 1 week after surgery.  If you should need take something for  relief of minor pain, you may take acetaminophen which is found in Tylenol,m Datril, Anacin-3 and Panadol. It is not blood thinner. The products listed below are.  Do not take any of the products listed below in addition to any listed on your instruction sheet.  A.P.C or A.P.C with Codeine Codeine Phosphate Capsules #3 Ibuprofen Ridaura  ABC compound Congesprin Imuran rimadil  Advil Cope Indocin Robaxisal  Alka-Seltzer Effervescent Pain Reliever and Antacid Coricidin or Coricidin-D  Indomethacin Rufen  Alka-Seltzer plus Cold Medicine Cosprin Ketoprofen S-A-C Tablets  Anacin Analgesic Tablets or Capsules Coumadin Korlgesic Salflex  Anacin Extra Strength Analgesic tablets or capsules CP-2 Tablets Lanoril Salicylate  Anaprox Cuprimine Capsules Levenox Salocol  Anexsia-D Dalteparin Magan Salsalate  Anodynos Darvon compound Magnesium Salicylate Sine-off  Ansaid Dasin Capsules Magsal Sodium Salicylate  Anturane Depen Capsules Marnal Soma  APF Arthritis pain formula Dewitt's Pills Measurin Stanback  Argesic Dia-Gesic Meclofenamic Sulfinpyrazone  Arthritis Bayer Timed Release Aspirin Diclofenac Meclomen Sulindac  Arthritis pain formula Anacin Dicumarol Medipren Supac  Analgesic (Safety coated) Arthralgen Diffunasal Mefanamic Suprofen  Arthritis Strength Bufferin Dihydrocodeine Mepro Compound Suprol  Arthropan liquid Dopirydamole Methcarbomol with Aspirin Synalgos  ASA tablets/Enseals Disalcid Micrainin Tagament  Ascriptin Doan's Midol Talwin  Ascriptin A/D Dolene Mobidin Tanderil  Ascriptin Extra Strength Dolobid Moblgesic Ticlid  Ascriptin with Codeine Doloprin or Doloprin with Codeine Momentum Tolectin  Asperbuf Duoprin Mono-gesic Trendar  Aspergum Duradyne Motrin or Motrin IB Triminicin  Aspirin plain, buffered or enteric coated Durasal Myochrisine Trigesic  Aspirin Suppositories Easprin Nalfon Trillsate  Aspirin with Codeine Ecotrin Regular or Extra Strength Naprosyn Uracel  Atromid-S  Efficin Naproxen Ursinus  Auranofin Capsules Elmiron Neocylate Vanquish  Axotal Emagrin Norgesic Verin  Azathioprine Empirin or Empirin with Codeine Normiflo Vitamin E  Azolid Emprazil Nuprin Voltaren  Bayer Aspirin plain, buffered or children's or timed BC Tablets or powders Encaprin Orgaran Warfarin Sodium  Buff-a-Comp Enoxaparin Orudis Zorpin  Buff-a-Comp with Codeine Equegesic Os-Cal-Gesic   Buffaprin Excedrin plain, buffered or Extra Strength Oxalid   Bufferin Arthritis Strength Feldene Oxphenbutazone   Bufferin plain or Extra Strength Feldene Capsules Oxycodone with Aspirin   Bufferin with Codeine Fenoprofen Fenoprofen Pabalate or Pabalate-SF   Buffets II Flogesic Panagesic   Buffinol plain or Extra Strength Florinal or Florinal with Codeine Panwarfarin   Buf-Tabs Flurbiprofen Penicillamine   Butalbital Compound Four-way cold tablets Penicillin   Butazolidin Fragmin Pepto-Bismol   Carbenicillin Geminisyn Percodan   Carna Arthritis Reliever Geopen Persantine   Carprofen Gold's salt Persistin   Chloramphenicol Goody's Phenylbutazone   Chloromycetin Haltrain Piroxlcam   Clmetidine heparin Plaquenil   Cllnoril Hyco-pap Ponstel   Clofibrate Hydroxy chloroquine Propoxyphen         Before stopping any of these medications, be sure to consult the physician who ordered them.  Some, such as Coumadin (Warfarin) are ordered to prevent or treat serious conditions such as "deep thrombosis", "pumonary embolisms", and other heart problems.  The amount of time that you may need off of the medication may also vary with the medication and the reason for which you were taking it.  If you are taking any of these medications, please make sure you notify your pain physician before you undergo any procedures.         Facet Blocks Patient Information  Description: The facets are joints in the spine between the vertebrae.  Like any joints in the body, facets can become irritated and painful.   Arthritis can also  effect the facets.  By injecting steroids and local anesthetic in and around these joints, we can temporarily block the nerve supply to them.  Steroids act directly on irritated nerves and tissues to reduce selling and inflammation which often leads to decreased pain.  Facet blocks may be done anywhere along the spine from the neck to the low back depending upon the location of your pain.   After numbing the skin with local anesthetic (like Novocaine), a small needle is passed onto the facet joints under x-ray guidance.  You may experience a sensation of pressure while this is being done.  The entire block usually lasts about 15-25 minutes.   Conditions which may be treated by facet blocks:   Low back/buttock pain  Neck/shoulder pain  Certain types of headaches  Preparation for the injection:  1. Do not eat any solid food or dairy products within 8 hours of your appointment. 2. You may drink clear liquid up to 3 hours before appointment.  Clear liquids include water, black coffee, juice or soda.  No milk or cream please. 3. You may take your regular medication, including pain medications, with a sip of water before your appointment.  Diabetics should hold regular insulin (if taken separately) and take 1/2 normal NPH dose the morning of the procedure.  Carry some sugar containing items with you to your appointment. 4. A driver must accompany you and be prepared to drive you home after your procedure. 5. Bring all your current medications with you. 6. An IV may be inserted and sedation may be given at the discretion of the physician. 7. A blood pressure cuff, EKG and other monitors will often be applied during the procedure.  Some patients may need to have extra oxygen administered for a short period. 8. You will be asked to provide medical information, including your allergies and medications, prior to the procedure.  We must know immediately if you are taking blood thinners  (like Coumadin/Warfarin) or if you are allergic to IV iodine contrast (dye).  We must know if you could possible be pregnant.  Possible side-effects:   Bleeding from needle site  Infection (rare, may require surgery)  Nerve injury (rare)  Numbness & tingling (temporary)  Difficulty urinating (rare, temporary)  Spinal headache (a headache worse with upright posture)  Light-headedness (temporary)  Pain at injection site (serveral days)  Decreased blood pressure (rare, temporary)  Weakness in arm/leg (temporary)  Pressure sensation in back/neck (temporary)   Call if you experience:   Fever/chills associated with headache or increased back/neck pain  Headache worsened by an upright position  New onset, weakness or numbness of an extremity below the injection site  Hives or difficulty breathing (go to the emergency room)  Inflammation or drainage at the injection site(s)  Severe back/neck pain greater than usual  New symptoms which are concerning to you  Please note:  Although the local anesthetic injected can often make your back or neck feel good for several hours after the injection, the pain will likely return. It takes 3-7 days for steroids to work.  You may not notice any pain relief for at least one week.  If effective, we will often do a series of 2-3 injections spaced 3-6 weeks apart to maximally decrease your pain.  After the initial series, you may be a candidate for a more permanent nerve block of the facets.  If you have any questions, please call [phone]  NOT EAT OR DRINK FOR 8 HOURS PRIOR TO PROCEDURE BRING A DRIVER

## 2016-02-03 ENCOUNTER — Encounter: Payer: Self-pay | Admitting: Pain Medicine

## 2016-02-22 ENCOUNTER — Encounter: Payer: Self-pay | Admitting: Pain Medicine

## 2016-02-22 ENCOUNTER — Ambulatory Visit
Admission: RE | Admit: 2016-02-22 | Discharge: 2016-02-22 | Disposition: A | Discharge status: Home / Self Care | Payer: Medicare Other | Source: Ambulatory Visit | Attending: Pain Medicine | Admitting: Pain Medicine

## 2016-02-22 ENCOUNTER — Ambulatory Visit (HOSPITAL_BASED_OUTPATIENT_CLINIC_OR_DEPARTMENT_OTHER): Payer: Medicare Other | Admitting: Pain Medicine

## 2016-02-22 VITALS — BP 102/60 | HR 80 | Temp 97.3°F | Resp 13 | Ht 61.0 in | Wt 148.0 lb

## 2016-02-22 DIAGNOSIS — K5900 Constipation, unspecified: Secondary | ICD-10-CM | POA: Insufficient documentation

## 2016-02-22 DIAGNOSIS — I471 Supraventricular tachycardia: Secondary | ICD-10-CM | POA: Diagnosis not present

## 2016-02-22 DIAGNOSIS — M545 Low back pain: Secondary | ICD-10-CM | POA: Diagnosis present

## 2016-02-22 DIAGNOSIS — M19049 Primary osteoarthritis, unspecified hand: Secondary | ICD-10-CM | POA: Diagnosis not present

## 2016-02-22 DIAGNOSIS — G43009 Migraine without aura, not intractable, without status migrainosus: Secondary | ICD-10-CM | POA: Insufficient documentation

## 2016-02-22 DIAGNOSIS — M810 Age-related osteoporosis without current pathological fracture: Secondary | ICD-10-CM | POA: Insufficient documentation

## 2016-02-22 DIAGNOSIS — K219 Gastro-esophageal reflux disease without esophagitis: Secondary | ICD-10-CM | POA: Insufficient documentation

## 2016-02-22 DIAGNOSIS — Z87442 Personal history of urinary calculi: Secondary | ICD-10-CM | POA: Diagnosis not present

## 2016-02-22 DIAGNOSIS — M47816 Spondylosis without myelopathy or radiculopathy, lumbar region: Secondary | ICD-10-CM | POA: Insufficient documentation

## 2016-02-22 DIAGNOSIS — F418 Other specified anxiety disorders: Secondary | ICD-10-CM | POA: Insufficient documentation

## 2016-02-22 DIAGNOSIS — M79605 Pain in left leg: Secondary | ICD-10-CM | POA: Insufficient documentation

## 2016-02-22 DIAGNOSIS — E876 Hypokalemia: Secondary | ICD-10-CM | POA: Insufficient documentation

## 2016-02-22 DIAGNOSIS — N3946 Mixed incontinence: Secondary | ICD-10-CM | POA: Insufficient documentation

## 2016-02-22 DIAGNOSIS — M8580 Other specified disorders of bone density and structure, unspecified site: Secondary | ICD-10-CM | POA: Insufficient documentation

## 2016-02-22 DIAGNOSIS — J309 Allergic rhinitis, unspecified: Secondary | ICD-10-CM | POA: Insufficient documentation

## 2016-02-22 DIAGNOSIS — E785 Hyperlipidemia, unspecified: Secondary | ICD-10-CM | POA: Diagnosis not present

## 2016-02-22 DIAGNOSIS — Z9689 Presence of other specified functional implants: Secondary | ICD-10-CM | POA: Insufficient documentation

## 2016-02-22 DIAGNOSIS — M1611 Unilateral primary osteoarthritis, right hip: Secondary | ICD-10-CM | POA: Diagnosis not present

## 2016-02-22 DIAGNOSIS — M25551 Pain in right hip: Secondary | ICD-10-CM | POA: Diagnosis not present

## 2016-02-22 DIAGNOSIS — M4856XA Collapsed vertebra, not elsewhere classified, lumbar region, initial encounter for fracture: Secondary | ICD-10-CM | POA: Insufficient documentation

## 2016-02-22 DIAGNOSIS — G8929 Other chronic pain: Secondary | ICD-10-CM | POA: Diagnosis not present

## 2016-02-22 MED ORDER — MIDAZOLAM HCL 5 MG/5ML IJ SOLN: 1.0000 mg | INTRAMUSCULAR | Status: DC | PRN

## 2016-02-22 MED ORDER — TRIAMCINOLONE ACETONIDE 40 MG/ML IJ SUSP: 40.0000 mg | Freq: Once | INTRAMUSCULAR | Status: DC

## 2016-02-22 MED ORDER — ROPIVACAINE HCL 2 MG/ML IJ SOLN: 9.0000 mL | Freq: Once | INTRAMUSCULAR | Status: DC

## 2016-02-22 MED ORDER — FENTANYL CITRATE (PF) 100 MCG/2ML IJ SOLN
INTRAMUSCULAR | Status: AC
  Administered 2016-02-22: 50 ug via INTRAVENOUS
  Filled 2016-02-22: qty 2

## 2016-02-22 MED ORDER — FENTANYL CITRATE (PF) 100 MCG/2ML IJ SOLN: 25.0000 ug | INTRAMUSCULAR | Status: DC | PRN

## 2016-02-22 MED ORDER — LIDOCAINE HCL (PF) 1 % IJ SOLN: 10.0000 mL | Freq: Once | INTRAMUSCULAR | Status: DC

## 2016-02-22 MED ORDER — MIDAZOLAM HCL 5 MG/5ML IJ SOLN
INTRAMUSCULAR | Status: AC
  Administered 2016-02-22: 2 mg via INTRAVENOUS
  Filled 2016-02-22: qty 5

## 2016-02-22 MED ORDER — LACTATED RINGERS IV SOLN: 1000.0000 mL | Freq: Once | INTRAVENOUS | Status: DC

## 2016-02-22 MED ORDER — ROPIVACAINE HCL 2 MG/ML IJ SOLN
INTRAMUSCULAR | Status: AC
  Administered 2016-02-22: 08:00:00
  Filled 2016-02-22: qty 20

## 2016-02-22 MED ORDER — TRIAMCINOLONE ACETONIDE 40 MG/ML IJ SUSP
INTRAMUSCULAR | Status: AC
  Administered 2016-02-22: 09:00:00
  Filled 2016-02-22: qty 2

## 2016-02-22 NOTE — Patient Instructions (Signed)
Pain Management Discharge Instructions  General Discharge Instructions :  If you need to reach your doctor call: Monday-Friday 8:00 am - 4:00 pm at [phone] or toll free [phone].  After clinic hours [phone] to have operator reach doctor.  Bring all of your medication bottles to all your appointments in the pain clinic.  To cancel or reschedule your appointment with Pain Management please remember to call 24 hours in advance to avoid a fee.  Refer to the educational materials which you have been given on: General Risks, I had my Procedure. Discharge Instructions, Post Sedation.  Post Procedure Instructions:  The drugs you were given will stay in your system until tomorrow, so for the next 24 hours you should not drive, make any legal decisions or drink any alcoholic beverages.  You may eat anything you prefer, but it is better to start with liquids then soups and crackers, and gradually work up to solid foods.  Please notify your doctor immediately if you have any unusual bleeding, trouble breathing or pain that is not related to your normal pain.  Depending on the type of procedure that was done, some parts of your body may feel week and/or numb.  This usually clears up by tonight or the next day.  Walk with the use of an assistive device or accompanied by an adult for the 24 hours.  You may use ice on the affected area for the first 24 hours.  Put ice in a Ziploc bag and cover with a towel and place against area 15 minutes on 15 minutes off.  You may switch to heat after 24 hours.GENERAL RISKS AND COMPLICATIONS  What are the risk, side effects and possible complications? Generally speaking, most procedures are safe.  However, with any procedure there are risks, side effects, and the possibility of complications.  The risks and complications are dependent upon the sites that are lesioned, or the type of nerve block to be performed.  The closer the procedure is to the spine,  the more serious the risks are.  Great care is taken when placing the radio frequency needles, block needles or lesioning probes, but sometimes complications can occur. 1. Infection: Any time there is an injection through the skin, there is a risk of infection.  This is why sterile conditions are used for these blocks.  There are four possible types of infection. 1. Localized skin infection. 2. Central Nervous System Infection-This can be in the form of Meningitis, which can be deadly. 3. Epidural Infections-This can be in the form of an epidural abscess, which can cause pressure inside of the spine, causing compression of the spinal cord with subsequent paralysis. This would require an emergency surgery to decompress, and there are no guarantees that the patient would recover from the paralysis. 4. Discitis-This is an infection of the intervertebral discs.  It occurs in about 1% of discography procedures.  It is difficult to treat and it may lead to surgery.        2. Pain: the needles have to go through skin and soft tissues, will cause soreness.       3. Damage to internal structures:  The nerves to be lesioned may be near blood vessels or    other nerves which can be potentially damaged.       4. Bleeding: Bleeding is more common if the patient is taking blood thinners such as  aspirin, Coumadin, Ticiid, Plavix, etc., or if he/she have some genetic predisposition  such as   hemophilia. Bleeding into the spinal canal can cause compression of the spinal  cord with subsequent paralysis.  This would require an emergency surgery to  decompress and there are no guarantees that the patient would recover from the  paralysis.       5. Pneumothorax:  Puncturing of a lung is a possibility, every time a needle is introduced in  the area of the chest or upper back.  Pneumothorax refers to free air around the  collapsed lung(s), inside of the thoracic cavity (chest cavity).  Another two possible  complications  related to a similar event would include: Hemothorax and Chylothorax.   These are variations of the Pneumothorax, where instead of air around the collapsed  lung(s), you may have blood or chyle, respectively.       6. Spinal headaches: They may occur with any procedures in the area of the spine.       7. Persistent CSF (Cerebro-Spinal Fluid) leakage: This is a rare problem, but may occur  with prolonged intrathecal or epidural catheters either due to the formation of a fistulous  track or a dural tear.       8. Nerve damage: By working so close to the spinal cord, there is always a possibility of  nerve damage, which could be as serious as a permanent spinal cord injury with  paralysis.       9. Death:  Although rare, severe deadly allergic reactions known as "Anaphylactic  reaction" can occur to any of the medications used.      10. Worsening of the symptoms:  We can always make thing worse.  What are the chances of something like this happening? Chances of any of this occuring are extremely low.  By statistics, you have more of a chance of getting killed in a motor vehicle accident: while driving to the hospital than any of the above occurring .  Nevertheless, you should be aware that they are possibilities.  In general, it is similar to taking a shower.  Everybody knows that you can slip, hit your head and get killed.  Does that mean that you should not shower again?  Nevertheless always keep in mind that statistics do not mean anything if you happen to be on the wrong side of them.  Even if a procedure has a 1 (one) in a 1,000,000 (million) chance of going wrong, it you happen to be that one..Also, keep in mind that by statistics, you have more of a chance of having something go wrong when taking medications.  Who should not have this procedure? If you are on a blood thinning medication (e.g. Coumadin, Plavix, see list of "Blood Thinners"), or if you have an active infection going on, you should not  have the procedure.  If you are taking any blood thinners, please inform your physician.  How should I prepare for this procedure?  Do not eat or drink anything at least six hours prior to the procedure.  Bring a driver with you .  It cannot be a taxi.  Come accompanied by an adult that can drive you back, and that is strong enough to help you if your legs get weak or numb from the local anesthetic.  Take all of your medicines the morning of the procedure with just enough water to swallow them.  If you have diabetes, make sure that you are scheduled to have your procedure done first thing in the morning, whenever possible.  If you have diabetes,   take only half of your insulin dose and notify our nurse that you have done so as soon as you arrive at the clinic.  If you are diabetic, but only take blood sugar pills (oral hypoglycemic), then do not take them on the morning of your procedure.  You may take them after you have had the procedure.  Do not take aspirin or any aspirin-containing medications, at least eleven (11) days prior to the procedure.  They may prolong bleeding.  Wear loose fitting clothing that may be easy to take off and that you would not mind if it got stained with Betadine or blood.  Do not wear any jewelry or perfume  Remove any nail coloring.  It will interfere with some of our monitoring equipment.  NOTE: Remember that this is not meant to be interpreted as a complete list of all possible complications.  Unforeseen problems may occur.  BLOOD THINNERS The following drugs contain aspirin or other products, which can cause increased bleeding during surgery and should not be taken for 2 weeks prior to and 1 week after surgery.  If you should need take something for relief of minor pain, you may take acetaminophen which is found in Tylenol,m Datril, Anacin-3 and Panadol. It is not blood thinner. The products listed below are.  Do not take any of the products listed below  in addition to any listed on your instruction sheet.  A.P.C or A.P.C with Codeine Codeine Phosphate Capsules #3 Ibuprofen Ridaura  ABC compound Congesprin Imuran rimadil  Advil Cope Indocin Robaxisal  Alka-Seltzer Effervescent Pain Reliever and Antacid Coricidin or Coricidin-D  Indomethacin Rufen  Alka-Seltzer plus Cold Medicine Cosprin Ketoprofen S-A-C Tablets  Anacin Analgesic Tablets or Capsules Coumadin Korlgesic Salflex  Anacin Extra Strength Analgesic tablets or capsules CP-2 Tablets Lanoril Salicylate  Anaprox Cuprimine Capsules Levenox Salocol  Anexsia-D Dalteparin Magan Salsalate  Anodynos Darvon compound Magnesium Salicylate Sine-off  Ansaid Dasin Capsules Magsal Sodium Salicylate  Anturane Depen Capsules Marnal Soma  APF Arthritis pain formula Dewitt's Pills Measurin Stanback  Argesic Dia-Gesic Meclofenamic Sulfinpyrazone  Arthritis Bayer Timed Release Aspirin Diclofenac Meclomen Sulindac  Arthritis pain formula Anacin Dicumarol Medipren Supac  Analgesic (Safety coated) Arthralgen Diffunasal Mefanamic Suprofen  Arthritis Strength Bufferin Dihydrocodeine Mepro Compound Suprol  Arthropan liquid Dopirydamole Methcarbomol with Aspirin Synalgos  ASA tablets/Enseals Disalcid Micrainin Tagament  Ascriptin Doan's Midol Talwin  Ascriptin A/D Dolene Mobidin Tanderil  Ascriptin Extra Strength Dolobid Moblgesic Ticlid  Ascriptin with Codeine Doloprin or Doloprin with Codeine Momentum Tolectin  Asperbuf Duoprin Mono-gesic Trendar  Aspergum Duradyne Motrin or Motrin IB Triminicin  Aspirin plain, buffered or enteric coated Durasal Myochrisine Trigesic  Aspirin Suppositories Easprin Nalfon Trillsate  Aspirin with Codeine Ecotrin Regular or Extra Strength Naprosyn Uracel  Atromid-S Efficin Naproxen Ursinus  Auranofin Capsules Elmiron Neocylate Vanquish  Axotal Emagrin Norgesic Verin  Azathioprine Empirin or Empirin with Codeine Normiflo Vitamin E  Azolid Emprazil Nuprin Voltaren  Bayer  Aspirin plain, buffered or children's or timed BC Tablets or powders Encaprin Orgaran Warfarin Sodium  Buff-a-Comp Enoxaparin Orudis Zorpin  Buff-a-Comp with Codeine Equegesic Os-Cal-Gesic   Buffaprin Excedrin plain, buffered or Extra Strength Oxalid   Bufferin Arthritis Strength Feldene Oxphenbutazone   Bufferin plain or Extra Strength Feldene Capsules Oxycodone with Aspirin   Bufferin with Codeine Fenoprofen Fenoprofen Pabalate or Pabalate-SF   Buffets II Flogesic Panagesic   Buffinol plain or Extra Strength Florinal or Florinal with Codeine Panwarfarin   Buf-Tabs Flurbiprofen Penicillamine   Butalbital Compound Four-way cold tablets   Penicillin   Butazolidin Fragmin Pepto-Bismol   Carbenicillin Geminisyn Percodan   Carna Arthritis Reliever Geopen Persantine   Carprofen Gold's salt Persistin   Chloramphenicol Goody's Phenylbutazone   Chloromycetin Haltrain Piroxlcam   Clmetidine heparin Plaquenil   Cllnoril Hyco-pap Ponstel   Clofibrate Hydroxy chloroquine Propoxyphen         Before stopping any of these medications, be sure to consult the physician who ordered them.  Some, such as Coumadin (Warfarin) are ordered to prevent or treat serious conditions such as "deep thrombosis", "pumonary embolisms", and other heart problems.  The amount of time that you may need off of the medication may also vary with the medication and the reason for which you were taking it.  If you are taking any of these medications, please make sure you notify your pain physician before you undergo any procedures.         Facet Joint Block, Care After Refer to this sheet in the next few weeks. These instructions provide you with information on caring for yourself after your procedure. Your health care provider may also give you more specific instructions. Your treatment has been planned according to current medical practices, but problems sometimes occur. Call your health care provider if you have any problems  or questions after your procedure. HOME CARE INSTRUCTIONS   Keep track of the amount of pain relief you feel and how long it lasts.  Limit pain medicine within the first 4-6 hours after the procedure as directed by your health care provider.  Resume taking dietary supplements and medicines as directed by your health care provider.  You may resume your regular diet.  Do not apply heat near or over the injection site(s) for 24 hours.   Do not take a bath or soak in water (such as a pool or lake) for 24 hours.  Do not drive for 24 hours unless approved by your health care provider.  Avoid strenuous activity for 24 hours.  Remove your bandages the morning after the procedure.   If the injection site is tender, applying an ice pack may relieve some tenderness. To do this:  Put ice in a bag.  Place a towel between your skin and the bag.  Leave the ice on for 15-20 minutes, 3-4 times a day.  Keep follow-up appointments as directed by your health care provider. SEEK MEDICAL CARE IF:   Your pain is not controlled by your medicines.   There is drainage from the injection site.   There is significant bleeding or swelling at the injection site.  You have diabetes and your blood sugar is above 180 mg/dL. SEEK IMMEDIATE MEDICAL CARE IF:   You develop a fever of 101F (38.3C) or greater.   You have worsening pain or swelling around the injection site.   You have red streaking around the injection site.   You develop severe pain that is not controlled by your medicines.   You develop a headache, stiff neck, nausea, or vomiting.   Your eyes become very sensitive to light.   You have weakness, paralysis, or tingling in your arms or legs that was not present before the procedure.   You develop difficulty urinating or breathing.    This information is not intended to replace advice given to you by your health care provider. Make sure you discuss any questions you  have with your health care provider.   Document Released: 05/01/2012 Document Revised: 06/05/2014 Document Reviewed: 05/01/2012 Elsevier Interactive Patient Education 2016   [employer] Facet Joint Block The facet joints connect the bones of the spine (vertebrae). They make it possible for you to bend, twist, and make other movements with your spine. They also prevent you from overbending, overtwisting, and making other excessive movements.  A facet joint block is a procedure where a numbing medicine (anesthetic) is injected into a facet joint. Often, a type of anti-inflammatory medicine called a steroid is also injected. A facet joint block may be done for two reasons:   Diagnosis. A facet joint block may be done as a test to see whether neck or back pain is caused by a worn-down or infected facet joint. If the pain gets better after a facet joint block, it means the pain is probably coming from the facet joint. If the pain does not get better, it means the pain is probably not coming from the facet joint.   Therapy. A facet joint block may be done to relieve neck or back pain caused by a facet joint. A facet joint block is only done as a therapy if the pain does not improve with medicine, exercise programs, physical therapy, and other forms of pain management. LET YOUR HEALTH CARE PROVIDER KNOW ABOUT:   Any allergies you have.   All medicines you are taking, including vitamins, herbs, eyedrops, and over-the-counter medicines and creams.   Previous problems you or members of your family have had with the use of anesthetics.   Any blood disorders you have had.   Other health problems you have. RISKS AND COMPLICATIONS Generally, having a facet joint block is safe. However, as with any procedure, complications can occur. Possible complications associated with having a facet joint block include:   Bleeding.   Injury to a nerve near the injection site.   Pain at the injection site.    Weakness or numbness in areas controlled by nerves near the injection site.   Infection.   Temporary fluid retention.   Allergic reaction to anesthetics or medicines used during the procedure. BEFORE THE PROCEDURE   Follow your health care provider's instructions if you are taking dietary supplements or medicines. You may need to stop taking them or reduce your dosage.   Do not take any new dietary supplements or medicines without asking your health care provider first.   Follow your health care provider's instructions about eating and drinking before the procedure. You may need to stop eating and drinking several hours before the procedure.   Arrange to have an adult drive you home after the procedure. PROCEDURE  You may need to remove your clothing and dress in an open-back gown so that your health care provider can access your spine.   The procedure will be done while you are lying on an X-ray table. Most of the time you will be asked to lie on your stomach, but you may be asked to lie in a different position if an injection will be made in your neck.   Special machines will be used to monitor your oxygen levels, heart rate, and blood pressure.   If an injection will be made in your neck, an intravenous (IV) tube will be inserted into one of your veins. Fluids and medicine will flow directly into your body through the IV tube.   The area over the facet joint where the injection will be made will be cleaned with an antiseptic soap. The surrounding skin will be covered with sterile drapes.   An anesthetic will be applied to   your skin to make the injection area numb. You may feel a temporary stinging or burning sensation.   A video X-ray machine will be used to locate the joint. A contrast dye may be injected into the facet joint area to help with locating the joint.   When the joint is located, an anesthetic medicine will be injected into the joint through the  needle.   Your health care provider will ask you whether you feel pain relief. If you do feel relief, a steroid may be injected to provide pain relief for a longer period of time. If you do not feel relief or feel only partial relief, additional injections of an anesthetic may be made in other facet joints.   The needle will be removed, the skin will be cleansed, and bandages will be applied.  AFTER THE PROCEDURE   You will be observed for 15-30 minutes before being allowed to go home. Do not drive. Have an adult drive you or take a taxi or public transportation instead.   If you feel pain relief, the pain will return in several hours or days when the anesthetic wears off.   You may feel pain relief 2-14 days after the procedure. The amount of time this relief lasts varies from person to person.   It is normal to feel some tenderness over the injected area(s) for 2 days following the procedure.   If you have diabetes, you may have a temporary increase in blood sugar.   This information is not intended to replace advice given to you by your health care provider. Make sure you discuss any questions you have with your health care provider.   Document Released: 10/04/2006 Document Revised: 06/05/2014 Document Reviewed: 03/04/2012 Elsevier Interactive Patient Education 2016 [employer]  

## 2016-02-22 NOTE — Progress Notes (Signed)
Patient's Name: Wanda [name]  MRN: [id number]  Referring Provider: Milinda Pointer, MD  DOB: [birth year]  PCP: [name], MD  DOS: 02/22/2016  Note by: Kathlen Brunswick. Dossie Arbour, MD  Service setting: Ambulatory outpatient  Location: [facility] (AMB) Pain Management Facility  Visit type: Procedure  Specialty: Interventional Pain Management  Patient type: Established   Primary Reason for Visit: Interventional Pain Management Treatment. CC: Back Pain (lower)  Procedure:  Anesthesia, Analgesia, Anxiolysis:  Type: Diagnostic Medial Branch Facet Block Region: Lumbar Level: L2, L3, L4, L5, & S1 Medial Branch Level(s) Laterality: Bilateral    Type: Moderate (Conscious) Sedation & Local Anesthesia Local Anesthetic: Lidocaine 1% Route: Intravenous (IV) IV Access: Secured Sedation: Meaningful verbal contact was maintained at all times during the procedure  Indication(s): Analgesia & Anxiolysis   Indications: 1. Lumbar facet syndrome (Location of Primary Source of Pain) (Bilateral) (R>L)     Pain Score: Pre-procedure: 6 /10 Post-procedure: 4 /10  Pre-Procedure Assessment:  Wanda [name] is a 60 y.o. year old, female patient, seen today for interventional treatment. 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 Her primarily concern today is the Back Pain (lower)   Pain Type: Chronic pain Pain Location: Back Pain Orientation: Lower Pain Descriptors / Indicators: Aching, Constant, Sharp, Stabbing Pain Frequency: Constant  Date of Last Visit: 02/02/16 Service Provided on Last Visit: Evaluation  Coagulation Parameters Lab Results  Component Value Date   PLT 269 12/04/2012    Verification of the correct person, correct site (including marking of site), and correct procedure were performed and confirmed by the patient.  Consent: Secured. Under the influence of no sedatives a written informed consent was obtained, after having provided information on the risks and possible complications. To fulfill our ethical and legal obligations, as recommended by the American Medical Association's Code of Ethics, we have provided information to the patient about our clinical impression; the nature and purpose of the treatment or procedure; the risks, benefits, and possible complications of the intervention; alternatives; the risk(s) and benefit(s) of the alternative treatment(s) or procedure(s); and the risk(s) and benefit(s) of doing nothing. The patient was provided information about the risks and possible complications associated with the procedure. These include, but are not limited to, failure to achieve desired goals, infection, bleeding, organ or nerve damage, allergic reactions, paralysis, and death. In the case of spinal procedures these may include, but are not limited to, failure to achieve desired goals, infection, bleeding, organ or nerve damage, allergic reactions, paralysis, and death. In addition, the patient was informed that Medicine is not an exact science; therefore, there is also the possibility of unforeseen risks and possible complications that may result in a  catastrophic outcome. The patient indicated having understood very clearly. We have given the patient no guarantees and we have made no promises. Enough time was given to the patient to ask questions,  all of which were answered to the patient's satisfaction.  Consent Attestation: I, the ordering provider, attest that I have discussed with the patient the benefits, risks, side-effects, alternatives, likelihood of achieving goals, and potential problems during recovery for the procedure that I have provided informed consent.  Pre-Procedure Preparation: Safety Precautions: Allergies reviewed. Appropriate site, procedure, and patient were confirmed by following the Joint Commission's Universal Protocol (UP.01.01.01), in the form of a "Time Out". The patient was asked to confirm marked site and procedure, before commencing. The patient was asked about blood thinners, or active infections, both of which were denied. Patient was assessed for positional comfort and all pressure points were checked before starting procedure. Infection Control Precautions: Sterile technique used. Standard Universal Precautions were taken as recommended by the Department of [facility] for Disease Control and Prevention (CDC). Standard pre-surgical skin prep was conducted. Respiratory hygiene and cough etiquette was practiced. Hand hygiene observed. Safe injection practices and needle disposal techniques followed. SDV (single dose vial) medications used. Medications properly checked for expiration dates and contaminants. Personal protective equipment (PPE) used: Surgical mask. Sterile Radiation-resistant gloves. Monitoring:  As per clinic protocol. Vitals:   02/22/16 0901 02/22/16 0907 02/22/16 0914 02/22/16 0924  BP: 94/65 (!) 110/53 103/63 102/60  Pulse: 75 80 76 80  Resp: 13     Temp:  97.3 F (36.3 C)    TempSrc:      SpO2: 95% 91% 95% 95%  Weight:      Height:      Calculated BMI: Body mass index is 27.96  kg/m. Allergies: She is allergic to codeine; levofloxacin; and propoxyphene hcl.. Allergy Precautions: None required  Description of Procedure Process:   Time-out: "Time-out" completed before starting procedure, as per protocol. Position: Prone Target Area: For Lumbar Facet blocks, the target is the groove formed by the junction of the transverse process and superior articular process. For the L5 dorsal ramus, the target is the notch between superior articular process and sacral ala. For the S1 dorsal ramus, the target is the superior and lateral edge of the posterior S1 Sacral foramen. Approach: Paramedial approach. Area Prepped: Entire Posterior Lumbosacral Region Prepping solution: ChloraPrep (2% chlorhexidine gluconate and 70% isopropyl alcohol) Safety Precautions: Aspiration looking for blood return was conducted prior to all injections. At no point did we inject any substances, as a needle was being advanced. No attempts were made at seeking any paresthesias. Safe injection practices and needle disposal techniques used. Medications properly checked for expiration dates. SDV (single dose vial) medications used. Description of the Procedure: Protocol guidelines were followed. The patient was placed in position over the fluoroscopy table. The target area was identified and the area prepped in the usual manner. Skin desensitized using vapocoolant spray. Skin & deeper tissues infiltrated with local anesthetic. Appropriate amount of time allowed to pass for local anesthetics to take effect. The procedure needle was introduced through the skin, ipsilateral to the reported pain, and advanced to the target area. Employing the "Medial Branch Technique", the needles were advanced to the angle made by the superior and medial portion of the transverse process, and the lateral and inferior portion of the superior articulating process of the targeted vertebral bodies. This area is known as "Burton's Eye" or the  "Eye of the [place] Dog". A procedure needle was introduced through the skin, and this time advanced to the angle made by the superior and medial border of the sacral ala, and the lateral border of the S1 vertebral  body. This last needle was later repositioned at the superior and lateral border of the posterior S1 foramen. Negative aspiration confirmed. Solution injected in intermittent fashion, asking for systemic symptoms every 0.5cc of injectate. The needles were then removed and the area cleansed, making sure to leave some of the prepping solution back to take advantage of its long term bactericidal properties. EBL: None Materials & Medications Used:  Needle(s) Used: 22g - 3.5" Spinal Needle(s)  Imaging Guidance:   Type of Imaging Technique: Fluoroscopy Guidance (Spinal) Indication(s): Assistance in needle guidance and placement for procedures requiring needle placement in or near specific anatomical locations not easily accessible without such assistance. Exposure Time: Please see nurses notes. Contrast: None required. Fluoroscopic Guidance: I was personally present in the fluoroscopy suite, where the patient was placed in position for the procedure, over the fluoroscopy-compatible table. Fluoroscopy was manipulated, using "Tunnel Vision Technique", to obtain the best possible view of the target area, on the affected side. Parallax error was corrected before commencing the procedure. A "direction-depth-direction" technique was used to introduce the needle under continuous pulsed fluoroscopic guidance. Once the target was reached, antero-posterior, oblique, and lateral fluoroscopic projection views were taken to confirm needle placement in all planes. Permanently recorded images stored by scanning into EMR. Interpretation: Intraoperative imaging interpretation by performing Physician. Adequate needle placement confirmed. Adequate needle placement confirmed in AP, lateral, & Oblique Views. No contrast  injected.  Antibiotic Prophylaxis:  Indication(s): No indications identified. Type:  Antibiotics Given (last 72 hours)    None       Post-operative Assessment:   Complications: No immediate post-treatment complications were observed. Disposition: Return to clinic for follow-up evaluation. The patient tolerated the entire procedure well. A repeat set of vitals were taken after the procedure and the patient was kept under observation following institutional policy, for this procedure. Post-procedural neurological assessment was performed, showing return to baseline, prior to discharge. The patient was discharged home, once institutional criteria were met. The patient was provided with post-procedure discharge instructions, including a section on how to identify potential problems. Should any problems arise concerning this procedure, the patient was given instructions to immediately contact us, at any time, without hesitation. In any case, we plan to contact the patient by telephone for a follow-up status report regarding this interventional procedure. Comments:  No additional relevant information.  Plan of Care   Problem List Items Addressed This Visit      High   Lumbar facet syndrome (Location of Primary Source of Pain) (Bilateral) (R>L) - Primary (Chronic)   Relevant Medications   diclofenac (VOLTAREN) 75 MG EC tablet   triamcinolone acetonide (KENALOG-40) 40 MG/ML injection (Completed)   fentaNYL (SUBLIMAZE) 100 MCG/2ML injection (Completed)   fentaNYL (SUBLIMAZE) injection 25-50 mcg   lactated ringers infusion 1,000 mL   midazolam (VERSED) 5 MG/5ML injection 1-2 mg   triamcinolone acetonide (KENALOG-40) injection 40 mg   lidocaine (PF) (XYLOCAINE) 1 % injection 10 mL   ropivacaine (PF) 2 mg/ml (0.2%) (NAROPIN) epidural 9 mL   Other Relevant Orders   DG C-Arm 1-60 Min-No Report (Completed)    Other Visit Diagnoses   None.     Requested PM Follow-up: Return in about 2 weeks  (around 03/07/2016) for Post-Procedure evaluation.  Future Appointments Date Time Provider [place]  03/29/2016 9:00 AM Milinda Pointer, MD [facility] None    Primary Care Physician: [name], MD Location: [facility] Note by: Kathlen Brunswick. Dossie Arbour, M.D, DABA, DABAPM, DABPM, DABIPP, FIPP  Illustration of the posterior view of the lumbar spine and the posterior neural structures. Laminae of L2 through S1 are labeled. DPRL5, dorsal primary ramus of L5; DPRS1, dorsal primary ramus of S1; DPR3, dorsal primary ramus of L3; FJ, facet (zygapophyseal) joint L3-L4; I, inferior articular process of L4; LB1, lateral branch of dorsal primary ramus of L1; IAB, inferior articular branches from L3 medial branch (supplies L4-L5 facet joint); IBP, intermediate branch plexus; MB3, medial branch of dorsal primary ramus of L3; NR3, third lumbar nerve root; S, superior articular process of L5; SAB, superior articular branches from L4 (supplies L4-5 facet joint also); TP3, transverse process of L3.  Disclaimer:  Medicine is not an Chief Strategy Officer. The only guarantee in medicine is that nothing is guaranteed. It is important to note that the decision to proceed with this intervention was based on the information collected from the patient. The Data and conclusions were drawn from the patient's questionnaire, the interview, and the physical examination. Because the information was provided in large part by the patient, it cannot be guaranteed that it has not been purposely or unconsciously manipulated. Every effort has been made to obtain as much relevant data as possible for this evaluation. It is important to note that the conclusions that lead to this procedure are derived in large part from the available data. Always take into account that the treatment will also be dependent on availability of resources and existing treatment guidelines, considered by other Pain Management  Practitioners as being common knowledge and practice, at the time of the intervention. For Medico-Legal purposes, it is also important to point out that variation in procedural techniques and pharmacological choices are the acceptable norm. The indications, contraindications, technique, and results of the above procedure should only be interpreted and judged by a Board-Certified Interventional Pain Specialist with extensive familiarity and expertise in the same exact procedure and technique. Attempts at providing opinions without similar or greater experience and expertise than that of the treating physician will be considered as inappropriate and unethical, and shall result in a formal complaint to the state medical board and applicable specialty societies.

## 2016-02-22 NOTE — Progress Notes (Signed)
Safety precautions to be maintained throughout the outpatient stay will include: orient to surroundings, keep bed in low position, maintain call bell within reach at all times, provide assistance with transfer out of bed and ambulation.  

## 2016-02-23 NOTE — Telephone Encounter (Signed)
Denies any needs or complaints at this time. Instructed to call if needed

## 2016-03-08 ENCOUNTER — Ambulatory Visit: Payer: Medicare Other | Attending: Specialist

## 2016-03-08 DIAGNOSIS — G4733 Obstructive sleep apnea (adult) (pediatric): Secondary | ICD-10-CM | POA: Diagnosis present

## 2016-03-29 ENCOUNTER — Encounter: Payer: Self-pay | Admitting: Pain Medicine

## 2016-03-29 ENCOUNTER — Ambulatory Visit: Payer: Medicare Other | Attending: Pain Medicine | Admitting: Pain Medicine

## 2016-03-29 VITALS — BP 112/85 | HR 94 | Temp 98.0°F | Resp 16 | Ht 61.0 in | Wt 142.0 lb

## 2016-03-29 DIAGNOSIS — E785 Hyperlipidemia, unspecified: Secondary | ICD-10-CM | POA: Insufficient documentation

## 2016-03-29 DIAGNOSIS — I471 Supraventricular tachycardia: Secondary | ICD-10-CM | POA: Diagnosis not present

## 2016-03-29 DIAGNOSIS — G43009 Migraine without aura, not intractable, without status migrainosus: Secondary | ICD-10-CM | POA: Insufficient documentation

## 2016-03-29 DIAGNOSIS — Z9689 Presence of other specified functional implants: Secondary | ICD-10-CM | POA: Diagnosis not present

## 2016-03-29 DIAGNOSIS — J309 Allergic rhinitis, unspecified: Secondary | ICD-10-CM | POA: Diagnosis not present

## 2016-03-29 DIAGNOSIS — M1611 Unilateral primary osteoarthritis, right hip: Secondary | ICD-10-CM | POA: Diagnosis not present

## 2016-03-29 DIAGNOSIS — E876 Hypokalemia: Secondary | ICD-10-CM | POA: Diagnosis not present

## 2016-03-29 DIAGNOSIS — M19049 Primary osteoarthritis, unspecified hand: Secondary | ICD-10-CM | POA: Diagnosis not present

## 2016-03-29 DIAGNOSIS — M4856XS Collapsed vertebra, not elsewhere classified, lumbar region, sequela of fracture: Secondary | ICD-10-CM | POA: Diagnosis not present

## 2016-03-29 DIAGNOSIS — Z87442 Personal history of urinary calculi: Secondary | ICD-10-CM | POA: Diagnosis not present

## 2016-03-29 DIAGNOSIS — M545 Low back pain: Secondary | ICD-10-CM | POA: Diagnosis not present

## 2016-03-29 DIAGNOSIS — M25551 Pain in right hip: Secondary | ICD-10-CM | POA: Insufficient documentation

## 2016-03-29 DIAGNOSIS — M79605 Pain in left leg: Secondary | ICD-10-CM | POA: Insufficient documentation

## 2016-03-29 DIAGNOSIS — Z87891 Personal history of nicotine dependence: Secondary | ICD-10-CM | POA: Insufficient documentation

## 2016-03-29 DIAGNOSIS — N3946 Mixed incontinence: Secondary | ICD-10-CM | POA: Diagnosis not present

## 2016-03-29 DIAGNOSIS — M1288 Other specific arthropathies, not elsewhere classified, other specified site: Secondary | ICD-10-CM | POA: Diagnosis not present

## 2016-03-29 DIAGNOSIS — M810 Age-related osteoporosis without current pathological fracture: Secondary | ICD-10-CM | POA: Insufficient documentation

## 2016-03-29 DIAGNOSIS — G8929 Other chronic pain: Secondary | ICD-10-CM | POA: Diagnosis not present

## 2016-03-29 DIAGNOSIS — K5909 Other constipation: Secondary | ICD-10-CM | POA: Insufficient documentation

## 2016-03-29 DIAGNOSIS — M8580 Other specified disorders of bone density and structure, unspecified site: Secondary | ICD-10-CM | POA: Diagnosis not present

## 2016-03-29 DIAGNOSIS — K219 Gastro-esophageal reflux disease without esophagitis: Secondary | ICD-10-CM | POA: Diagnosis not present

## 2016-03-29 DIAGNOSIS — F418 Other specified anxiety disorders: Secondary | ICD-10-CM | POA: Diagnosis not present

## 2016-03-29 DIAGNOSIS — M47816 Spondylosis without myelopathy or radiculopathy, lumbar region: Secondary | ICD-10-CM | POA: Diagnosis not present

## 2016-03-29 NOTE — Progress Notes (Signed)
Patient's Name: Wanda [name]  MRN: [id number]  Referring Provider: Herminio Commons, MD  DOB: 26-Aug-1955  PCP: Wanda Snipes KEY, MD  DOS: 03/29/2016  Note by: Wanda [name]. Wanda Arbour, MD  Service setting: Ambulatory outpatient  Specialty: Interventional Pain Management  Location: [facility] (AMB) Pain Management Facility    Patient type: Established   Primary Reason(s) for Visit: Encounter for post-procedure evaluation of chronic illness with mild to moderate exacerbation CC: Back Pain (lower)  HPI  [name] is a 60 y.o. year old, female patient, who comes today for an initial evaluation. 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 Her primarily concern today is the Back Pain (lower)  Pain Assessment: Self-Reported Pain Score: 5 /10 Clinically the patient looks like a 3/10 Reported level is inconsistent with clinical observations. Information on the proper use of the pain score provided to the patient today. Pain Type: Chronic pain Pain Location: Back Pain Orientation: Lower Pain Descriptors / Indicators: Aching, Stabbing, Spasm (tinge of  stabbing pain that comes and goes) Pain Frequency: Several days a week (more the last couple of weeks)  [name] comes in today for post-procedure evaluation after the treatment done on 02/22/2016.  Further details on both, my assessment(s), as well as the proposed treatment plan, please see below.  Post-Procedure Assessment  02/22/2016 Procedure: Diagnostic bilateral lumbar facet block under fluoroscopic guidance and IV sedation Influential Factors: BMI: 26.83 kg/m Intra-procedural challenges: None observed Assessment challenges: None detected         Post-procedural side-effects, adverse reactions, or complications: None reported Reported issues: None  Sedation: Sedation provided. When no sedatives are used, the analgesic levels obtained are directly associated to the effectiveness of the local anesthetics. However, when sedation is provided, the level of analgesia obtained during the initial 1 hour following the intervention, is believed to be the result of a combination of factors. These factors may include, but are not limited to: 1. The effectiveness of the local anesthetics used. 2. The effects of the analgesic(s) and/or anxiolytic(s) used. 3. The degree of discomfort experienced by the patient at the time of the procedure. 4. The patients ability and reliability in recalling and recording the events. 5. The presence and influence of possible secondary gains and/or psychosocial factors. Reported result: Relief experienced during the 1st hour after the procedure: 100 % (Ultra-Short Term Relief) Interpretative annotation: Analgesia during this period is likely to be Local Anesthetic and/or IV Sedative (Analgesic/Anxiolitic) related.          Effects of local anesthetic: The analgesic effects attained during this period are directly associated to the localized infiltration of local anesthetics and therefore cary significant diagnostic value as to the etiological location, or  anatomical origin, of the pain. Expected duration of relief is directly dependent on the pharmacodynamics of the local anesthetic used. Long-acting (4-6 hours) anesthetics used.  Reported result: Relief during the next 4 to 6 hour after the procedure: 100 % (Short-Term Relief) Interpretative annotation: Complete relief would suggest area to be the source of the pain.          Long-term benefit: Defined as the period of time past the expected duration of local anesthetics. With the possible exception of prolonged sympathetic blockade from the local anesthetics, benefits during this period are typically attributed to, or associated with, other factors such as analgesic sensory neuropraxia, antiinflammatory effects, or beneficial biochemical changes provided by agents other than the local anesthetics Reported result: Extended relief following procedure: 70 % (has some muscle spasms that starts on the left lower back and  goes to the middle, and  has some tinge of pain in one area to the left lower back) (Long-Term Relief) Interpretative annotation: Good relief. This could suggest inflammation to be a significant component in the etiology to the pain.          Current benefits: Defined as persistent relief that continues at this point in time.   Reported results: Treated area: 75 %       Interpretative annotation: Ongoing benefits would suggest effective therapeutic approach  Interpretation: Results would suggest a successful diagnostic intervention.           Laboratory Chemistry  Inflammation Markers No results found for: ESRSEDRATE, CRP Renal Function Lab Results  Component Value Date   BUN 16 12/04/2012   CREATININE 0.64 12/04/2012   GFRAA >60 12/04/2012   GFRNONAA >60 12/04/2012   Hepatic Function Lab Results  Component Value Date   AST 25 02/01/2010   ALT 17 02/01/2010   ALBUMIN 3.7 02/01/2010   Electrolytes Lab Results  Component Value Date   NA 140 12/04/2012   K 3.8  12/04/2012   CL 107 12/04/2012   CALCIUM 9.4 12/04/2012   Pain Modulating Vitamins No results found for: Maralyn Sago [id number], [id number], [id number], 25OHVITD1, 25OHVITD2, 25OHVITD3, VITAMINB12 Coagulation Parameters Lab Results  Component Value Date   PLT 269 12/04/2012   Cardiovascular Lab Results  Component Value Date   HGB 12.1 12/04/2012   HCT 34.2 (L) 12/04/2012   Note: Lab results reviewed.  Recent Diagnostic Imaging Review  Dg C-arm 1-60 Min-no Report  Result Date: 02/22/2016 CLINICAL DATA: Sub-acute chronic pain syndrome C-ARM 1-60 MINUTES Fluoroscopy was utilized by the requesting physician.  No radiographic interpretation.   Note: Imaging results reviewed.  Meds  The patient has a current medication list which includes the following prescription(s): atorvastatin, buspirone, clonazepam, diclofenac, nadolol, pantoprazole, prazosin, and valacyclovir.  Current Outpatient Prescriptions on File Prior to Visit  Medication Sig  . busPIRone (BUSPAR) 15 MG tablet Take 15 mg by mouth 2 (two) times daily.  . diclofenac (VOLTAREN) 75 MG EC tablet Take 75 mg by mouth 2 (two) times daily.  . nadolol (CORGARD) 20 MG tablet Take 20 mg by mouth daily.    . pantoprazole (PROTONIX) 40 MG tablet Take 40 mg by mouth 2 (two) times daily.   . prazosin (MINIPRESS) 2 MG capsule Take 2 mg by mouth at bedtime.  . valACYclovir (VALTREX) 1000 MG tablet Take 1 tablet (1,000 mg total) by mouth daily.   No current facility-administered medications on file prior to visit.    ROS  Constitutional: Denies any fever or chills Gastrointestinal: No reported hemesis, hematochezia, vomiting, or acute GI distress Musculoskeletal: Denies any acute onset joint swelling, redness, loss of ROM, or weakness Neurological: No reported episodes of acute onset apraxia, aphasia,  dysarthria, agnosia, amnesia, paralysis, loss of coordination, or loss of consciousness  Allergies  Ms. Browder is allergic to codeine;  levofloxacin; and propoxyphene hcl.  [place]  Drug: Ms. Gronau  reports that she does not use drugs. Alcohol:  reports that she drinks alcohol. Tobacco:  reports that she has quit smoking. Her smoking use included Cigarettes. She has a 3.50 pack-year smoking history. She quit smokeless tobacco use about 25 years ago. Medical:  has a past medical history of Anxiety; BRONCHITIS, ACUTE WITH BRONCHOSPASM (06/25/2010); Depression; GASTROENTERITIS WITHOUT DEHYDRATION (05/14/2009); GERD (gastroesophageal reflux disease); Herpes; Hyperlipidemia; and Irregular heart beat. Family: family history includes Alcohol abuse in her daughter; COPD in her father and mother; Cancer in her cousin and paternal aunt; Depression in her daughter; Diabetes in her cousin, father, mother, paternal grandmother, and sister; Heart disease in her father and mother; Tuberculosis in her maternal aunt.  Past Surgical History:  Procedure Laterality Date  . ABDOMINAL HYSTERECTOMY    . APPENDECTOMY    . BACK SURGERY     X 3  . BREAST EXCISIONAL BIOPSY Right    surgical bx age 89   . COLONOSCOPY N/A 08/28/2013   Procedure: COLONOSCOPY;  Surgeon: Danie Binder, MD;  [facility]: [facility];  Service: Endoscopy;  Laterality: N/A;  8:30 AM  . SHOULDER SURGERY    . Spinal cord stimulator     Constitutional Exam  General appearance: Well nourished, well developed, and well hydrated. In no apparent acute distress Vitals:   03/29/16 0850  BP: 112/85  Pulse: 94  Resp: 16  Temp: 98 F (36.7 C)  TempSrc: Oral  SpO2: 98%  Weight: 142 lb (64.4 kg)  Height: 5\' 1"  (1.549 m)   BMI Assessment: Estimated body mass index is 26.83 kg/m as calculated from the following:   Height as of this encounter: 5\' 1"  (1.549 m).   Weight as of this encounter: 142 lb (64.4 kg).  BMI interpretation table: BMI level Category Range association with higher incidence of chronic pain  <18 kg/m2 Underweight   18.5-24.9 kg/m2 Ideal body weight   25-29.9  kg/m2 Overweight Increased incidence by 20%  30-34.9 kg/m2 Obese (Class I) Increased incidence by 68%  35-39.9 kg/m2 Severe obesity (Class II) Increased incidence by 136%  >40 kg/m2 Extreme obesity (Class III) Increased incidence by 254%   BMI Readings from Last 4 Encounters:  03/29/16 26.83 kg/m  02/22/16 27.96 kg/m  02/02/16 27.59 kg/m  10/18/15 27.59 kg/m   Wt Readings from Last 4 Encounters:  03/29/16 142 lb (64.4 kg)  02/22/16 148 lb (67.1 kg)  02/02/16 146 lb (66.2 kg)  10/18/15 146 lb (66.2 kg)  Psych/Mental status: Alert, oriented x 3 (person, place, & time) Eyes: PERLA Respiratory: No evidence of acute respiratory distress  Cervical Spine Exam  Inspection: No masses, redness, or swelling Alignment: Symmetrical Functional ROM: Unrestricted ROM Stability: No instability detected Muscle strength & Tone: Functionally intact Sensory: Unimpaired Palpation: Non-contributory  Upper Extremity (UE) Exam    Side: Right upper extremity  Side: Left upper extremity  Inspection: No masses, redness, swelling, or asymmetry  Inspection: No masses, redness, swelling, or asymmetry  Functional ROM: Unrestricted ROM         Functional ROM: Unrestricted ROM          Muscle strength & Tone: Functionally intact  Muscle strength & Tone: Functionally intact  Sensory: Unimpaired  Sensory: Unimpaired  Palpation: Non-contributory  Palpation: Non-contributory   Thoracic Spine Exam  Inspection: No masses,  redness, or swelling Alignment: Symmetrical Functional ROM: Unrestricted ROM Stability: No instability detected Sensory: Unimpaired Muscle strength & Tone: Functionally intact Palpation: Non-contributory  Lumbar Spine Exam  Inspection: No masses, redness, or swelling Alignment: Symmetrical Functional ROM: Decreased ROM Stability: No instability detected Muscle strength & Tone: Functionally intact Sensory: Movement-associated pain Palpation: Complains of area being tender to  palpation Provocative Tests: Lumbar Hyperextension and rotation test: Positive bilaterally for facet joint pain. Patrick's Maneuver: evaluation deferred today              Gait & Posture Assessment  Ambulation: Unassisted Gait: Relatively normal for age and body habitus Posture: WNL   Lower Extremity Exam    Side: Right lower extremity  Side: Left lower extremity  Inspection: No masses, redness, swelling, or asymmetry  Inspection: No masses, redness, swelling, or asymmetry  Functional ROM: Unrestricted ROM          Functional ROM: Unrestricted ROM          Muscle strength & Tone: Functionally intact  Muscle strength & Tone: Functionally intact  Sensory: Unimpaired  Sensory: Unimpaired  Palpation: Non-contributory  Palpation: Non-contributory   Assessment  Primary Diagnosis & Pertinent Problem List: The primary encounter diagnosis was Chronic low back pain (Location of Primary Source of Pain) (Bilateral) (R>L). Diagnoses of Lumbar facet syndrome (Location of Primary Source of Pain) (Bilateral) (R>L) and Lumbar spondylosis were also pertinent to this visit.  Visit Diagnosis: 1. Chronic low back pain (Location of Primary Source of Pain) (Bilateral) (R>L)   2. Lumbar facet syndrome (Location of Primary Source of Pain) (Bilateral) (R>L)   3. Lumbar spondylosis    Plan of Care  Pharmacotherapy (Medications Ordered): No orders of the defined types were placed in this encounter.  New Prescriptions   No medications on file   Medications administered during this visit: Ms. Haslett had no medications administered during this visit. Lab-work, Procedure(s), & Referral(s) Ordered: Orders Placed This Encounter  Procedures  . LUMBAR FACET(MEDIAL BRANCH NERVE BLOCK) MBNB   Imaging & Referral(s) Ordered: None  Interventional Therapies: Pending/Scheduled/Planned:   Diagnostic Medial Branch Facet Block #2, under fluoroscopic guidance and IV sedation.    Considering:   Completed to  diagnostic lumbar facet blocks under fluoroscopic guidance and IV sedation.  If the patient again obtains good relief of the pain from the diagnostic bilateral lumbar facet block, then we will move on to radiofrequency ablation.    PRN Procedures:   None at this point.    Requested PM Follow-up: Return for Schedule Procedure, (ASAA).  Future Appointments Date Time Provider [place]  04/05/2016 10:15 AM Milinda Pointer, MD [facility] None   Primary Care Physician: Wanda Snipes KEY, MD Location: [facility] Note by: Wanda [name]. Wanda [name], M.D, DABA, DABAPM, DABPM, DABIPP, FIPP  Pain Score Disclaimer: We use the NRS-11 scale. This is a self-reported, subjective measurement of pain severity with only modest accuracy. It is used primarily to identify changes within a particular patient. It must be understood that outpatient pain scales are significantly less accurate that those used for research, where they can be applied under ideal controlled circumstances with minimal exposure to variables. In reality, the score is likely to be a combination of pain intensity and pain affect, where pain affect describes the degree of emotional arousal or changes in action readiness caused by the sensory experience of pain. Factors such as social and work situation, setting, emotional state, anxiety levels, expectation, and prior pain experience may influence pain  perception and show large inter-individual differences that may also be affected by time variables.  Patient instructions provided during this appointment: Patient Instructions   GENERAL RISKS AND COMPLICATIONS  What are the risk, side effects and possible complications? Generally speaking, most procedures are safe.  However, with any procedure there are risks, side effects, and the possibility of complications.  The risks and complications are dependent upon the sites that are lesioned, or the type of nerve  block to be performed.  The closer the procedure is to the spine, the more serious the risks are.  Great care is taken when placing the radio frequency needles, block needles or lesioning probes, but sometimes complications can occur. 1. Infection: Any time there is an injection through the skin, there is a risk of infection.  This is why sterile conditions are used for these blocks.  There are four possible types of infection. 1. Localized skin infection. 2. Central Nervous System Infection-This can be in the form of Meningitis, which can be deadly. 3. Epidural Infections-This can be in the form of an epidural abscess, which can cause pressure inside of the spine, causing compression of the spinal cord with subsequent paralysis. This would require an emergency surgery to decompress, and there are no guarantees that the patient would recover from the paralysis. 4. Discitis-This is an infection of the intervertebral discs.  It occurs in about 1% of discography procedures.  It is difficult to treat and it may lead to surgery.        2. Pain: the needles have to go through skin and soft tissues, will cause soreness.       3. Damage to internal structures:  The nerves to be lesioned may be near blood vessels or    other nerves which can be potentially damaged.       4. Bleeding: Bleeding is more common if the patient is taking blood thinners such as  aspirin, Coumadin, Ticiid, Plavix, etc., or if he/she have some genetic predisposition  such as hemophilia. Bleeding into the spinal canal can cause compression of the spinal  cord with subsequent paralysis.  This would require an emergency surgery to  decompress and there are no guarantees that the patient would recover from the  paralysis.       5. Pneumothorax:  Puncturing of a lung is a possibility, every time a needle is introduced in  the area of the chest or upper back.  Pneumothorax refers to free air around the  collapsed lung(s), inside of the thoracic  cavity (chest cavity).  Another two possible  complications related to a similar event would include: Hemothorax and Chylothorax.   These are variations of the Pneumothorax, where instead of air around the collapsed  lung(s), you may have blood or chyle, respectively.       6. Spinal headaches: They may occur with any procedures in the area of the spine.       7. Persistent CSF (Cerebro-Spinal Fluid) leakage: This is a rare problem, but may occur  with prolonged intrathecal or epidural catheters either due to the formation of a fistulous  track or a dural tear.       8. Nerve damage: By working so close to the spinal cord, there is always a possibility of  nerve damage, which could be as serious as a permanent spinal cord injury with  paralysis.       9. Death:  Although rare, severe deadly allergic reactions known as "Anaphylactic  reaction" can occur to any of the medications used.      10. Worsening of the symptoms:  We can always make thing worse.  What are the chances of something like this happening? Chances of any of this occuring are extremely low.  By statistics, you have more of a chance of getting killed in a motor vehicle accident: while driving to the hospital than any of the above occurring .  Nevertheless, you should be aware that they are possibilities.  In general, it is similar to taking a shower.  Everybody knows that you can slip, hit your head and get killed.  Does that mean that you should not shower again?  Nevertheless always keep in mind that statistics do not mean anything if you happen to be on the wrong side of them.  Even if a procedure has a 1 (one) in a 1,000,000 (million) chance of going wrong, it you happen to be that one..Also, keep in mind that by statistics, you have more of a chance of having something go wrong when taking medications.  Who should not have this procedure? If you are on a blood thinning medication (e.g. Coumadin, Plavix, see list of "Blood Thinners"),  or if you have an active infection going on, you should not have the procedure.  If you are taking any blood thinners, please inform your physician.  How should I prepare for this procedure?  Do not eat or drink anything at least six hours prior to the procedure.  Bring a driver with you .  It cannot be a taxi.  Come accompanied by an adult that can drive you back, and that is strong enough to help you if your legs get weak or numb from the local anesthetic.  Take all of your medicines the morning of the procedure with just enough water to swallow them.  If you have diabetes, make sure that you are scheduled to have your procedure done first thing in the morning, whenever possible.  If you have diabetes, take only half of your insulin dose and notify our nurse that you have done so as soon as you arrive at the clinic.  If you are diabetic, but only take blood sugar pills (oral hypoglycemic), then do not take them on the morning of your procedure.  You may take them after you have had the procedure.  Do not take aspirin or any aspirin-containing medications, at least eleven (11) days prior to the procedure.  They may prolong bleeding.  Wear loose fitting clothing that may be easy to take off and that you would not mind if it got stained with Betadine or blood.  Do not wear any jewelry or perfume  Remove any nail coloring.  It will interfere with some of our monitoring equipment.  NOTE: Remember that this is not meant to be interpreted as a complete list of all possible complications.  Unforeseen problems may occur.  BLOOD THINNERS The following drugs contain aspirin or other products, which can cause increased bleeding during surgery and should not be taken for 2 weeks prior to and 1 week after surgery.  If you should need take something for relief of minor pain, you may take acetaminophen which is found in Tylenol,m Datril, Anacin-3 and Panadol. It is not blood thinner. The products  listed below are.  Do not take any of the products listed below in addition to any listed on your instruction sheet.  A.P.C or A.P.C with Codeine Codeine Phosphate Capsules #3  Ibuprofen Ridaura  ABC compound Congesprin Imuran rimadil  Advil Cope Indocin Robaxisal  Alka-Seltzer Effervescent Pain Reliever and Antacid Coricidin or Coricidin-D  Indomethacin Rufen  Alka-Seltzer plus Cold Medicine Cosprin Ketoprofen S-A-C Tablets  Anacin Analgesic Tablets or Capsules Coumadin Korlgesic Salflex  Anacin Extra Strength Analgesic tablets or capsules CP-2 Tablets Lanoril Salicylate  Anaprox Cuprimine Capsules Levenox Salocol  Anexsia-D Dalteparin Magan Salsalate  Anodynos Darvon compound Magnesium Salicylate Sine-off  Ansaid Dasin Capsules Magsal Sodium Salicylate  Anturane Depen Capsules Marnal Soma  APF Arthritis pain formula Dewitt's Pills Measurin Stanback  Argesic Dia-Gesic Meclofenamic Sulfinpyrazone  Arthritis Bayer Timed Release Aspirin Diclofenac Meclomen Sulindac  Arthritis pain formula Anacin Dicumarol Medipren Supac  Analgesic (Safety coated) Arthralgen Diffunasal Mefanamic Suprofen  Arthritis Strength Bufferin Dihydrocodeine Mepro Compound Suprol  Arthropan liquid Dopirydamole Methcarbomol with Aspirin Synalgos  ASA tablets/Enseals Disalcid Micrainin Tagament  Ascriptin Doan's Midol Talwin  Ascriptin A/D Dolene Mobidin Tanderil  Ascriptin Extra Strength Dolobid Moblgesic Ticlid  Ascriptin with Codeine Doloprin or Doloprin with Codeine Momentum Tolectin  Asperbuf Duoprin Mono-gesic Trendar  Aspergum Duradyne Motrin or Motrin IB Triminicin  Aspirin plain, buffered or enteric coated Durasal Myochrisine Trigesic  Aspirin Suppositories Easprin Nalfon Trillsate  Aspirin with Codeine Ecotrin Regular or Extra Strength Naprosyn Uracel  Atromid-S Efficin Naproxen Ursinus  Auranofin Capsules Elmiron Neocylate Vanquish  Axotal Emagrin Norgesic Verin  Azathioprine Empirin or Empirin with  Codeine Normiflo Vitamin E  Azolid Emprazil Nuprin Voltaren  Bayer Aspirin plain, buffered or children's or timed BC Tablets or powders Encaprin Orgaran Warfarin Sodium  Buff-a-Comp Enoxaparin Orudis Zorpin  Buff-a-Comp with Codeine Equegesic Os-Cal-Gesic   Buffaprin Excedrin plain, buffered or Extra Strength Oxalid   Bufferin Arthritis Strength Feldene Oxphenbutazone   Bufferin plain or Extra Strength Feldene Capsules Oxycodone with Aspirin   Bufferin with Codeine Fenoprofen Fenoprofen Pabalate or Pabalate-SF   Buffets II Flogesic Panagesic   Buffinol plain or Extra Strength Florinal or Florinal with Codeine Panwarfarin   Buf-Tabs Flurbiprofen Penicillamine   Butalbital Compound Four-way cold tablets Penicillin   Butazolidin Fragmin Pepto-Bismol   Carbenicillin Geminisyn Percodan   Carna Arthritis Reliever Geopen Persantine   Carprofen Gold's salt Persistin   Chloramphenicol Goody's Phenylbutazone   Chloromycetin Haltrain Piroxlcam   Clmetidine heparin Plaquenil   Cllnoril Hyco-pap Ponstel   Clofibrate Hydroxy chloroquine Propoxyphen         Before stopping any of these medications, be sure to consult the physician who ordered them.  Some, such as Coumadin (Warfarin) are ordered to prevent or treat serious conditions such as "deep thrombosis", "pumonary embolisms", and other heart problems.  The amount of time that you may need off of the medication may also vary with the medication and the reason for which you were taking it.  If you are taking any of these medications, please make sure you notify your pain physician before you undergo any procedures.         Facet Blocks Patient Information  Description: The facets are joints in the spine between the vertebrae.  Like any joints in the body, facets can become irritated and painful.  Arthritis can also effect the facets.  By injecting steroids and local anesthetic in and around these joints, we can temporarily block the nerve  supply to them.  Steroids act directly on irritated nerves and tissues to reduce selling and inflammation which often leads to decreased pain.  Facet blocks may be done anywhere along the spine from the neck to the low back depending upon the  location of your pain.   After numbing the skin with local anesthetic (like Novocaine), a small needle is passed onto the facet joints under x-ray guidance.  You may experience a sensation of pressure while this is being done.  The entire block usually lasts about 15-25 minutes.   Conditions which may be treated by facet blocks:   Low back/buttock pain  Neck/shoulder pain  Certain types of headaches  Preparation for the injection:  1. Do not eat any solid food or dairy products within 8 hours of your appointment. 2. You may drink clear liquid up to 3 hours before appointment.  Clear liquids include water, black coffee, juice or soda.  No milk or cream please. 3. You may take your regular medication, including pain medications, with a sip of water before your appointment.  Diabetics should hold regular insulin (if taken separately) and take 1/2 normal NPH dose the morning of the procedure.  Carry some sugar containing items with you to your appointment. 4. A driver must accompany you and be prepared to drive you home after your procedure. 5. Bring all your current medications with you. 6. An IV may be inserted and sedation may be given at the discretion of the physician. 7. A blood pressure cuff, EKG and other monitors will often be applied during the procedure.  Some patients may need to have extra oxygen administered for a short period. 8. You will be asked to provide medical information, including your allergies and medications, prior to the procedure.  We must know immediately if you are taking blood thinners (like Coumadin/Warfarin) or if you are allergic to IV iodine contrast (dye).  We must know if you could possible be pregnant.  Possible  side-effects:   Bleeding from needle site  Infection (rare, may require surgery)  Nerve injury (rare)  Numbness & tingling (temporary)  Difficulty urinating (rare, temporary)  Spinal headache (a headache worse with upright posture)  Light-headedness (temporary)  Pain at injection site (serveral days)  Decreased blood pressure (rare, temporary)  Weakness in arm/leg (temporary)  Pressure sensation in back/neck (temporary)   Call if you experience:   Fever/chills associated with headache or increased back/neck pain  Headache worsened by an upright position  New onset, weakness or numbness of an extremity below the injection site  Hives or difficulty breathing (go to the emergency room)  Inflammation or drainage at the injection site(s)  Severe back/neck pain greater than usual  New symptoms which are concerning to you  Please note:  Although the local anesthetic injected can often make your back or neck feel good for several hours after the injection, the pain will likely return. It takes 3-7 days for steroids to work.  You may not notice any pain relief for at least one week.  If effective, we will often do a series of 2-3 injections spaced 3-6 weeks apart to maximally decrease your pain.  After the initial series, you may be a candidate for a more permanent nerve block of the facets.  If you have any questions, please call #[phone]) [facility]

## 2016-03-29 NOTE — Patient Instructions (Signed)
GENERAL RISKS AND COMPLICATIONS  What are the risk, side effects and possible complications? Generally speaking, most procedures are safe.  However, with any procedure there are risks, side effects, and the possibility of complications.  The risks and complications are dependent upon the sites that are lesioned, or the type of nerve block to be performed.  The closer the procedure is to the spine, the more serious the risks are.  Great care is taken when placing the radio frequency needles, block needles or lesioning probes, but sometimes complications can occur. 1. Infection: Any time there is an injection through the skin, there is a risk of infection.  This is why sterile conditions are used for these blocks.  There are four possible types of infection. 1. Localized skin infection. 2. Central Nervous System Infection-This can be in the form of Meningitis, which can be deadly. 3. Epidural Infections-This can be in the form of an epidural abscess, which can cause pressure inside of the spine, causing compression of the spinal cord with subsequent paralysis. This would require an emergency surgery to decompress, and there are no guarantees that the patient would recover from the paralysis. 4. Discitis-This is an infection of the intervertebral discs.  It occurs in about 1% of discography procedures.  It is difficult to treat and it may lead to surgery.        2. Pain: the needles have to go through skin and soft tissues, will cause soreness.       3. Damage to internal structures:  The nerves to be lesioned may be near blood vessels or    other nerves which can be potentially damaged.       4. Bleeding: Bleeding is more common if the patient is taking blood thinners such as  aspirin, Coumadin, Ticiid, Plavix, etc., or if he/she have some genetic predisposition  such as hemophilia. Bleeding into the spinal canal can cause compression of the spinal  cord with subsequent paralysis.  This would require an  emergency surgery to  decompress and there are no guarantees that the patient would recover from the  paralysis.       5. Pneumothorax:  Puncturing of a lung is a possibility, every time a needle is introduced in  the area of the chest or upper back.  Pneumothorax refers to free air around the  collapsed lung(s), inside of the thoracic cavity (chest cavity).  Another two possible  complications related to a similar event would include: Hemothorax and Chylothorax.   These are variations of the Pneumothorax, where instead of air around the collapsed  lung(s), you may have blood or chyle, respectively.       6. Spinal headaches: They may occur with any procedures in the area of the spine.       7. Persistent CSF (Cerebro-Spinal Fluid) leakage: This is a rare problem, but may occur  with prolonged intrathecal or epidural catheters either due to the formation of a fistulous  track or a dural tear.       8. Nerve damage: By working so close to the spinal cord, there is always a possibility of  nerve damage, which could be as serious as a permanent spinal cord injury with  paralysis.       9. Death:  Although rare, severe deadly allergic reactions known as "Anaphylactic  reaction" can occur to any of the medications used.      10. Worsening of the symptoms:  We can always make thing worse.    What are the chances of something like this happening? Chances of any of this occuring are extremely low.  By statistics, you have more of a chance of getting killed in a motor vehicle accident: while driving to the hospital than any of the above occurring .  Nevertheless, you should be aware that they are possibilities.  In general, it is similar to taking a shower.  Everybody knows that you can slip, hit your head and get killed.  Does that mean that you should not shower again?  Nevertheless always keep in mind that statistics do not mean anything if you happen to be on the wrong side of them.  Even if a procedure has a 1  (one) in a 1,000,000 (million) chance of going wrong, it you happen to be that one..Also, keep in mind that by statistics, you have more of a chance of having something go wrong when taking medications.  Who should not have this procedure? If you are on a blood thinning medication (e.g. Coumadin, Plavix, see list of "Blood Thinners"), or if you have an active infection going on, you should not have the procedure.  If you are taking any blood thinners, please inform your physician.  How should I prepare for this procedure?  Do not eat or drink anything at least six hours prior to the procedure.  Bring a driver with you .  It cannot be a taxi.  Come accompanied by an adult that can drive you back, and that is strong enough to help you if your legs get weak or numb from the local anesthetic.  Take all of your medicines the morning of the procedure with just enough water to swallow them.  If you have diabetes, make sure that you are scheduled to have your procedure done first thing in the morning, whenever possible.  If you have diabetes, take only half of your insulin dose and notify our nurse that you have done so as soon as you arrive at the clinic.  If you are diabetic, but only take blood sugar pills (oral hypoglycemic), then do not take them on the morning of your procedure.  You may take them after you have had the procedure.  Do not take aspirin or any aspirin-containing medications, at least eleven (11) days prior to the procedure.  They may prolong bleeding.  Wear loose fitting clothing that may be easy to take off and that you would not mind if it got stained with Betadine or blood.  Do not wear any jewelry or perfume  Remove any nail coloring.  It will interfere with some of our monitoring equipment.  NOTE: Remember that this is not meant to be interpreted as a complete list of all possible complications.  Unforeseen problems may occur.  BLOOD THINNERS The following drugs  contain aspirin or other products, which can cause increased bleeding during surgery and should not be taken for 2 weeks prior to and 1 week after surgery.  If you should need take something for relief of minor pain, you may take acetaminophen which is found in Tylenol,m Datril, Anacin-3 and Panadol. It is not blood thinner. The products listed below are.  Do not take any of the products listed below in addition to any listed on your instruction sheet.  A.P.C or A.P.C with Codeine Codeine Phosphate Capsules #3 Ibuprofen Ridaura  ABC compound Congesprin Imuran rimadil  Advil Cope Indocin Robaxisal  Alka-Seltzer Effervescent Pain Reliever and Antacid Coricidin or Coricidin-D  Indomethacin Rufen    Alka-Seltzer plus Cold Medicine Cosprin Ketoprofen S-A-C Tablets  Anacin Analgesic Tablets or Capsules Coumadin Korlgesic Salflex  Anacin Extra Strength Analgesic tablets or capsules CP-2 Tablets Lanoril Salicylate  Anaprox Cuprimine Capsules Levenox Salocol  Anexsia-D Dalteparin Magan Salsalate  Anodynos Darvon compound Magnesium Salicylate Sine-off  Ansaid Dasin Capsules Magsal Sodium Salicylate  Anturane Depen Capsules Marnal Soma  APF Arthritis pain formula Dewitt's Pills Measurin Stanback  Argesic Dia-Gesic Meclofenamic Sulfinpyrazone  Arthritis Bayer Timed Release Aspirin Diclofenac Meclomen Sulindac  Arthritis pain formula Anacin Dicumarol Medipren Supac  Analgesic (Safety coated) Arthralgen Diffunasal Mefanamic Suprofen  Arthritis Strength Bufferin Dihydrocodeine Mepro Compound Suprol  Arthropan liquid Dopirydamole Methcarbomol with Aspirin Synalgos  ASA tablets/Enseals Disalcid Micrainin Tagament  Ascriptin Doan's Midol Talwin  Ascriptin A/D Dolene Mobidin Tanderil  Ascriptin Extra Strength Dolobid Moblgesic Ticlid  Ascriptin with Codeine Doloprin or Doloprin with Codeine Momentum Tolectin  Asperbuf Duoprin Mono-gesic Trendar  Aspergum Duradyne Motrin or Motrin IB Triminicin  Aspirin  plain, buffered or enteric coated Durasal Myochrisine Trigesic  Aspirin Suppositories Easprin Nalfon Trillsate  Aspirin with Codeine Ecotrin Regular or Extra Strength Naprosyn Uracel  Atromid-S Efficin Naproxen Ursinus  Auranofin Capsules Elmiron Neocylate Vanquish  Axotal Emagrin Norgesic Verin  Azathioprine Empirin or Empirin with Codeine Normiflo Vitamin E  Azolid Emprazil Nuprin Voltaren  Bayer Aspirin plain, buffered or children's or timed BC Tablets or powders Encaprin Orgaran Warfarin Sodium  Buff-a-Comp Enoxaparin Orudis Zorpin  Buff-a-Comp with Codeine Equegesic Os-Cal-Gesic   Buffaprin Excedrin plain, buffered or Extra Strength Oxalid   Bufferin Arthritis Strength Feldene Oxphenbutazone   Bufferin plain or Extra Strength Feldene Capsules Oxycodone with Aspirin   Bufferin with Codeine Fenoprofen Fenoprofen Pabalate or Pabalate-SF   Buffets II Flogesic Panagesic   Buffinol plain or Extra Strength Florinal or Florinal with Codeine Panwarfarin   Buf-Tabs Flurbiprofen Penicillamine   Butalbital Compound Four-way cold tablets Penicillin   Butazolidin Fragmin Pepto-Bismol   Carbenicillin Geminisyn Percodan   Carna Arthritis Reliever Geopen Persantine   Carprofen Gold's salt Persistin   Chloramphenicol Goody's Phenylbutazone   Chloromycetin Haltrain Piroxlcam   Clmetidine heparin Plaquenil   Cllnoril Hyco-pap Ponstel   Clofibrate Hydroxy chloroquine Propoxyphen         Before stopping any of these medications, be sure to consult the physician who ordered them.  Some, such as Coumadin (Warfarin) are ordered to prevent or treat serious conditions such as "deep thrombosis", "pumonary embolisms", and other heart problems.  The amount of time that you may need off of the medication may also vary with the medication and the reason for which you were taking it.  If you are taking any of these medications, please make sure you notify your pain physician before you undergo any  procedures.         Facet Blocks Patient Information  Description: The facets are joints in the spine between the vertebrae.  Like any joints in the body, facets can become irritated and painful.  Arthritis can also effect the facets.  By injecting steroids and local anesthetic in and around these joints, we can temporarily block the nerve supply to them.  Steroids act directly on irritated nerves and tissues to reduce selling and inflammation which often leads to decreased pain.  Facet blocks may be done anywhere along the spine from the neck to the low back depending upon the location of your pain.   After numbing the skin with local anesthetic (like Novocaine), a small needle is passed onto the facet joints under x-ray guidance.    You may experience a sensation of pressure while this is being done.  The entire block usually lasts about 15-25 minutes.   Conditions which may be treated by facet blocks:   Low back/buttock pain  Neck/shoulder pain  Certain types of headaches  Preparation for the injection:  1. Do not eat any solid food or dairy products within 8 hours of your appointment. 2. You may drink clear liquid up to 3 hours before appointment.  Clear liquids include water, black coffee, juice or soda.  No milk or cream please. 3. You may take your regular medication, including pain medications, with a sip of water before your appointment.  Diabetics should hold regular insulin (if taken separately) and take 1/2 normal NPH dose the morning of the procedure.  Carry some sugar containing items with you to your appointment. 4. A driver must accompany you and be prepared to drive you home after your procedure. 5. Bring all your current medications with you. 6. An IV may be inserted and sedation may be given at the discretion of the physician. 7. A blood pressure cuff, EKG and other monitors will often be applied during the procedure.  Some patients may need to have extra oxygen  administered for a short period. 8. You will be asked to provide medical information, including your allergies and medications, prior to the procedure.  We must know immediately if you are taking blood thinners (like Coumadin/Warfarin) or if you are allergic to IV iodine contrast (dye).  We must know if you could possible be pregnant.  Possible side-effects:   Bleeding from needle site  Infection (rare, may require surgery)  Nerve injury (rare)  Numbness & tingling (temporary)  Difficulty urinating (rare, temporary)  Spinal headache (a headache worse with upright posture)  Light-headedness (temporary)  Pain at injection site (serveral days)  Decreased blood pressure (rare, temporary)  Weakness in arm/leg (temporary)  Pressure sensation in back/neck (temporary)   Call if you experience:   Fever/chills associated with headache or increased back/neck pain  Headache worsened by an upright position  New onset, weakness or numbness of an extremity below the injection site  Hives or difficulty breathing (go to the emergency room)  Inflammation or drainage at the injection site(s)  Severe back/neck pain greater than usual  New symptoms which are concerning to you  Please note:  Although the local anesthetic injected can often make your back or neck feel good for several hours after the injection, the pain will likely return. It takes 3-7 days for steroids to work.  You may not notice any pain relief for at least one week.  If effective, we will often do a series of 2-3 injections spaced 3-6 weeks apart to maximally decrease your pain.  After the initial series, you may be a candidate for a more permanent nerve block of the facets.  If you have any questions, please call [phone] [facility] 

## 2016-03-29 NOTE — Progress Notes (Signed)
Safety precautions to be maintained throughout the outpatient stay will include: orient to surroundings, keep bed in low position, maintain call bell within reach at all times, provide assistance with transfer out of bed and ambulation.  

## 2016-04-05 ENCOUNTER — Ambulatory Visit
Admission: RE | Admit: 2016-04-05 | Discharge: 2016-04-05 | Disposition: A | Discharge status: Home / Self Care | Payer: Medicare Other | Source: Ambulatory Visit | Attending: Pain Medicine | Admitting: Pain Medicine

## 2016-04-05 ENCOUNTER — Encounter: Payer: Self-pay | Admitting: Pain Medicine

## 2016-04-05 ENCOUNTER — Ambulatory Visit (HOSPITAL_BASED_OUTPATIENT_CLINIC_OR_DEPARTMENT_OTHER): Payer: Medicare Other | Admitting: Pain Medicine

## 2016-04-05 VITALS — BP 137/81 | HR 62 | Temp 98.1°F | Resp 14 | Ht 61.0 in | Wt 142.0 lb

## 2016-04-05 DIAGNOSIS — M47816 Spondylosis without myelopathy or radiculopathy, lumbar region: Secondary | ICD-10-CM

## 2016-04-05 DIAGNOSIS — M1288 Other specific arthropathies, not elsewhere classified, other specified site: Secondary | ICD-10-CM | POA: Diagnosis not present

## 2016-04-05 DIAGNOSIS — Z90710 Acquired absence of both cervix and uterus: Secondary | ICD-10-CM | POA: Diagnosis not present

## 2016-04-05 DIAGNOSIS — M545 Low back pain: Secondary | ICD-10-CM

## 2016-04-05 DIAGNOSIS — G8929 Other chronic pain: Secondary | ICD-10-CM

## 2016-04-05 MED ORDER — MIDAZOLAM HCL 5 MG/5ML IJ SOLN
1.0000 mg | INTRAMUSCULAR | Status: DC | PRN
  Administered 2016-04-17: 2 mg via INTRAVENOUS
  Filled 2016-04-05: qty 5

## 2016-04-05 MED ORDER — FENTANYL CITRATE (PF) 100 MCG/2ML IJ SOLN
25.0000 ug | INTRAMUSCULAR | Status: DC | PRN
  Administered 2016-04-17: 50 ug via INTRAVENOUS
  Filled 2016-04-05: qty 2

## 2016-04-05 MED ORDER — TRIAMCINOLONE ACETONIDE 40 MG/ML IJ SUSP
40.0000 mg | Freq: Once | INTRAMUSCULAR | Status: AC
  Administered 2016-04-17: 40 mg
  Filled 2016-04-05: qty 1

## 2016-04-05 MED ORDER — ROPIVACAINE HCL 2 MG/ML IJ SOLN
9.0000 mL | Freq: Once | INTRAMUSCULAR | Status: AC
  Administered 2016-04-17: 9 mL
  Filled 2016-04-05: qty 20

## 2016-04-05 MED ORDER — LACTATED RINGERS IV SOLN: 1000.0000 mL | Freq: Once | INTRAVENOUS | Status: DC

## 2016-04-05 MED ORDER — ROPIVACAINE HCL 2 MG/ML IJ SOLN
9.0000 mL | Freq: Once | INTRAMUSCULAR | Status: AC
  Administered 2016-04-17: 9 mL
  Filled 2016-04-05: qty 10

## 2016-04-05 MED ORDER — LIDOCAINE HCL (PF) 1 % IJ SOLN
10.0000 mL | Freq: Once | INTRAMUSCULAR | Status: AC
  Administered 2016-04-17: 10 mL

## 2016-04-05 NOTE — Patient Instructions (Signed)

## 2016-04-05 NOTE — Progress Notes (Signed)
Patient's Name: Wanda [name]  MRN: [id number]  Referring Provider: Milinda Pointer, MD  DOB: 1955-11-16  PCP: Wanda Snipes KEY, MD  DOS: 04/05/2016  Note by: Wanda [name]. Wanda Arbour, MD  Service setting: Ambulatory outpatient  Location: [facility] (AMB) Pain Management Facility  Visit type: Procedure  Specialty: Interventional Pain Management  Patient type: Established   Primary Reason for Visit: Interventional Pain Management Treatment. CC: Back Pain (lower, both sides)  Procedure:  Anesthesia, Analgesia, Anxiolysis:  Type: Diagnostic Medial Branch Facet Block Region: Lumbar Level: L2, L3, L4, L5, & S1 Medial Branch Level(s) Laterality: Bilateral  Type: Local Anesthesia with Moderate (Conscious) Sedation Local Anesthetic: Lidocaine 1% Route: Intravenous (IV) IV Access: Secured Sedation: Meaningful verbal contact was maintained at all times during the procedure  Indication(s): Analgesia and Anxiety  Indications: 1. Lumbar facet syndrome (Location of Primary Source of Pain) (Bilateral) (R>L)   2. Chronic low back pain (Location of Primary Source of Pain) (Bilateral) (R>L)   3. Lumbar spondylosis    Pain Score: Pre-procedure: 4 /10 Post-procedure: 0-No pain/10  Pre-Procedure Assessment:  Wanda [name] is a 60 y.o. (year old), female patient, seen today for interventional treatment. She  has a past surgical history that includes Shoulder surgery; Appendectomy; Abdominal hysterectomy; Back surgery; Spinal cord stimulator; Colonoscopy (N/A, 08/28/2013); and Breast excisional biopsy (Right).. Her primarily concern today is the Back Pain (lower, both sides) The primary encounter diagnosis was Lumbar facet syndrome (Location of Primary Source of Pain) (Bilateral) (R>L). Diagnoses of Chronic low back pain (Location of Primary Source of Pain) (Bilateral) (R>L) and Lumbar spondylosis were also pertinent to this visit.  Pain Type: Chronic pain Pain Location: Back Pain Orientation: Lower, Right,  Left Pain Descriptors / Indicators: Sharp, Nagging Pain Frequency: Intermittent  Date of Last Visit: 03/29/16 Service Provided on Last Visit: Med Refill  Coagulation Parameters Lab Results  Component Value Date   PLT 269 12/04/2012   Verification of the correct person, correct site (including marking of site), and correct procedure were performed and confirmed by the patient.  Consent: Before the procedure and under the influence of no sedative(s), amnesic(s), or anxiolytics, the patient was informed of the treatment options, risks and possible complications. To fulfill our ethical and legal obligations, as recommended by the American Medical Association's Code of Ethics, I have informed the patient of my clinical impression; the nature and purpose of the treatment or procedure; the risks, benefits, and possible complications of the intervention; the alternatives, including doing nothing; the risk(s) and benefit(s) of the alternative treatment(s) or procedure(s); and the risk(s) and benefit(s) of doing nothing. The patient was provided information about the general risks and possible complications associated with the procedure. These may include, but are not limited to: failure to achieve desired goals, infection, bleeding, organ or nerve damage, allergic reactions, paralysis, and death. In addition, the patient was informed of those risks and complications associated to Spine-related procedures, such as failure to decrease pain; infection (i.e.: Meningitis, epidural or intraspinal abscess); bleeding (i.e.: epidural hematoma, subarachnoid hemorrhage, or any other type of intraspinal or peri-dural bleeding); organ or nerve damage (i.e.: Any type of peripheral nerve, nerve root, or spinal cord injury) with subsequent damage to sensory, motor, and/or autonomic systems, resulting in permanent pain, numbness, and/or weakness of one or several areas of the body; allergic reactions; (i.e.: anaphylactic  reaction); and/or death. Furthermore, the patient was informed of those risks and complications associated with the medications. These include, but are not limited to: allergic reactions (i.e.: anaphylactic  or anaphylactoid reaction(s)); adrenal axis suppression; blood sugar elevation that in diabetics may result in ketoacidosis or comma; water retention that in patients with history of congestive heart failure may result in shortness of breath, pulmonary edema, and decompensation with resultant heart failure; weight gain; swelling or edema; medication-induced neural toxicity; particulate matter embolism and blood vessel occlusion with resultant organ, and/or nervous system infarction; and/or aseptic necrosis of one or more joints. Finally, the patient was informed that Medicine is not an exact science; therefore, there is also the possibility of unforeseen or unpredictable risks and/or possible complications that may result in a catastrophic outcome. The patient indicated having understood very clearly. We have given the patient no guarantees and we have made no promises. Enough time was given to the patient to ask questions, all of which were answered to the patient's satisfaction. Wanda [name] has indicated that she wanted to continue with the procedure.  Consent Attestation: I, the ordering provider, attest that I have discussed with the patient the benefits, risks, side-effects, alternatives, likelihood of achieving goals, and potential problems during recovery for the procedure that I have provided informed consent.  Pre-Procedure Preparation:  Safety Precautions: Allergies reviewed. The patient was asked about blood thinners, or active infections, both of which were denied. The patient was asked to confirm the procedure and laterality, before marking the site, and again before commencing the procedure. Appropriate site, procedure, and patient were confirmed by following the Joint Commission's  Universal Protocol (UP.01.01.01), in the form of a "Time Out". The patient was asked to participate by confirming the accuracy of the "Time Out" information. Patient was assessed for positional comfort and pressure points before starting the procedure. Allergies: She is allergic to codeine; levofloxacin; and propoxyphene hcl. Allergy Precautions: None required Infection Control Precautions: Sterile technique used. Standard Universal Precautions were taken as recommended by the Department of [facility] for Disease Control and Prevention (CDC). Standard pre-surgical skin prep was conducted. Respiratory hygiene and cough etiquette was practiced. Hand hygiene observed. Safe injection practices and needle disposal techniques followed. SDV (single dose vial) medications used. Medications properly checked for expiration dates and contaminants. Personal protective equipment (PPE) used as per protocol. Monitoring:  As per clinic protocol. Vitals:   04/05/16 1128 04/05/16 1136 04/05/16 1146 04/05/16 1156  BP: 102/77 121/76 112/68 137/81  Pulse: 68 67 64 62  Resp: 12 16 15 14   Temp:      TempSrc:      SpO2: 93% 97% 98% 97%  Weight:      Height:      Calculated BMI: Body mass index is 26.83 kg/m. Time-out: "Time-out" completed before starting procedure, as per protocol.  Description of Procedure Process:   Time-out: "Time-out" completed before starting procedure, as per protocol. Position: Prone Target Area: For Lumbar Facet blocks, the target is the groove formed by the junction of the transverse process and superior articular process. For the L5 dorsal ramus, the target is the notch between superior articular process and sacral ala. For the S1 dorsal ramus, the target is the superior and lateral edge of the posterior S1 Sacral foramen. Approach: Paramedial approach. Area Prepped: Entire Posterior Lumbosacral Region Prepping solution: ChloraPrep (2% chlorhexidine gluconate and 70% isopropyl  alcohol) Safety Precautions: Aspiration looking for blood return was conducted prior to all injections. At no point did we inject any substances, as a needle was being advanced. No attempts were made at seeking any paresthesias. Safe injection practices and needle disposal techniques used. Medications properly  checked for expiration dates. SDV (single dose vial) medications used. Description of the Procedure: Protocol guidelines were followed. The patient was placed in position over the fluoroscopy table. The target area was identified and the area prepped in the usual manner. Skin desensitized using vapocoolant spray. Skin & deeper tissues infiltrated with local anesthetic. Appropriate amount of time allowed to pass for local anesthetics to take effect. The procedure needle was introduced through the skin, ipsilateral to the reported pain, and advanced to the target area. Employing the "Medial Branch Technique", the needles were advanced to the angle made by the superior and medial portion of the transverse process, and the lateral and inferior portion of the superior articulating process of the targeted vertebral bodies. This area is known as "Burton's Eye" or the "Eye of the [place] Dog". A procedure needle was introduced through the skin, and this time advanced to the angle made by the superior and medial border of the sacral ala, and the lateral border of the S1 vertebral body. This last needle was later repositioned at the superior and lateral border of the posterior S1 foramen. Negative aspiration confirmed. Solution injected in intermittent fashion, asking for systemic symptoms every 0.5cc of injectate. The needles were then removed and the area cleansed, making sure to leave some of the prepping solution back to take advantage of its long term bactericidal properties. EBL: None Materials & Medications Used:  Needle(s) Used: 22g - 3.5" Spinal Needle(s)   Illustration of the posterior view of the  lumbar spine and the posterior neural structures. Laminae of L2 through S1 are labeled. DPRL5, dorsal primary ramus of L5; DPRS1, dorsal primary ramus of S1; DPR3, dorsal primary ramus of L3; FJ, facet (zygapophyseal) joint L3-L4; I, inferior articular process of L4; LB1, lateral branch of dorsal primary ramus of L1; IAB, inferior articular branches from L3 medial branch (supplies L4-L5 facet joint); IBP, intermediate branch plexus; MB3, medial branch of dorsal primary ramus of L3; NR3, third lumbar nerve root; S, superior articular process of L5; SAB, superior articular branches from L4 (supplies L4-5 facet joint also); TP3, transverse process of L3.  Imaging Guidance (Spinal):  Type of Imaging Technique: Fluoroscopy Guidance (Spinal) Indication(s): Assistance in needle guidance and placement for procedures requiring needle placement in or near specific anatomical locations not easily accessible without such assistance. Exposure Time: Please see nurses notes. Contrast: None used. Fluoroscopic Guidance: I was personally present during the use of fluoroscopy. "Tunnel Vision Technique" used to obtain the best possible view of the target area. Parallax error corrected before commencing the procedure. "Direction-depth-direction" technique used to introduce the needle under continuous pulsed fluoroscopy. Once target was reached, antero-posterior, oblique, and lateral fluoroscopic projection used confirm needle placement in all planes. Images permanently stored in EMR. Interpretation: No contrast injected. I personally interpreted the imaging intraoperatively. Adequate needle placement confirmed in multiple planes. Permanent images saved into the patient's record.  Antibiotic Prophylaxis:  Indication(s): No indications identified. Type:  Antibiotics Given (last 72 hours)    None      Post-operative Assessment:  Complications: No immediate post-treatment complications observed by team, or reported by  patient. Disposition: The patient tolerated the entire procedure well. A repeat set of vitals were taken after the procedure and the patient was kept under observation following institutional policy, for this type of procedure. Post-procedural neurological assessment was performed, showing return to baseline, prior to discharge. The patient was provided with post-procedure discharge instructions, including a section on how to identify potential problems. Should  any problems arise concerning this procedure, the patient was given instructions to immediately contact us, at any time, without hesitation. In any case, we plan to contact the patient by telephone for a follow-up status report regarding this interventional procedure. Comments:  No additional relevant information.  Plan of Care  Discharge to: Discharge home  Medications ordered for procedure: Meds ordered this encounter  Medications  . fentaNYL (SUBLIMAZE) injection 25-50 mcg    Make sure Narcan is available in the pyxis when using this medication. In the event of respiratory depression (RR< 8/min): Titrate NARCAN (naloxone) in increments of 0.1 to 0.2 mg IV at 2-3 minute intervals, until desired degree of reversal.  . lactated ringers infusion 1,000 mL  . midazolam (VERSED) 5 MG/5ML injection 1-2 mg    Make sure Flumazenil is available in the pyxis when using this medication. If oversedation occurs, administer 0.2 mg IV over 15 sec. If after 45 sec no response, administer 0.2 mg again over 1 min; may repeat at 1 min intervals; not to exceed 4 doses (1 mg)  . triamcinolone acetonide (KENALOG-40) injection 40 mg  . lidocaine (PF) (XYLOCAINE) 1 % injection 10 mL  . ropivacaine (PF) 2 mg/ml (0.2%) (NAROPIN) epidural 9 mL  . triamcinolone acetonide (KENALOG-40) injection 40 mg  . ropivacaine (PF) 2 mg/ml (0.2%) (NAROPIN) epidural 9 mL   Medications administered: (For more details, see medical record) Ms. Gilboy had no medications  administered during this visit. Lab-work, Procedure(s), & Referral(s) Ordered: Orders Placed This Encounter  Procedures  . DG C-Arm 1-60 Min-No Report   Imaging Ordered: Results for orders placed in visit on 02/22/16  DG C-Arm 1-60 Min-No Report   Narrative CLINICAL DATA: Sub-acute chronic pain syndrome   C-ARM 1-60 MINUTES  Fluoroscopy was utilized by the requesting physician.  No radiographic  interpretation.     New Prescriptions   No medications on file   Physician-requested Follow-up:  Return in about 2 weeks (around 04/19/2016) for Post-Procedure evaluation.  Future Appointments Date Time Provider [place]  05/31/2016 1:30 PM Wanda Pointer, MD [facility] None   Primary Care Physician: Wanda Snipes KEY, MD Location: [facility] Note by: Wanda [name]. Wanda [name], M.D, DABA, DABAPM, DABPM, DABIPP, FIPP  Disclaimer:  Medicine is not an exact science. The only guarantee in medicine is that nothing is guaranteed. It is important to note that the decision to proceed with this intervention was based on the information collected from the patient. The Data and conclusions were drawn from the patient's questionnaire, the interview, and the physical examination. Because the information was provided in large part by the patient, it cannot be guaranteed that it has not been purposely or unconsciously manipulated. Every effort has been made to obtain as much relevant data as possible for this evaluation. It is important to note that the conclusions that lead to this procedure are derived in large part from the available data. Always take into account that the treatment will also be dependent on availability of resources and existing treatment guidelines, considered by other Pain Management Practitioners as being common knowledge and practice, at the time of the intervention. For Medico-Legal purposes, it is also important to point out that variation in  procedural techniques and pharmacological choices are the acceptable norm. The indications, contraindications, technique, and results of the above procedure should only be interpreted and judged by a Board-Certified Interventional Pain Specialist with extensive familiarity and expertise in the same exact procedure and technique. Attempts at providing  opinions without similar or greater experience and expertise than that of the treating physician will be considered as inappropriate and unethical, and shall result in a formal complaint to the state medical board and applicable specialty societies.  Instructions provided at this appointment: Patient Instructions  Pain Management Discharge Instructions  General Discharge Instructions :  If you need to reach your doctor call: Monday-Friday 8:00 am - 4:00 pm at [phone] or toll free [phone].  After clinic hours [phone] to have operator reach doctor.  Bring all of your medication bottles to all your appointments in the pain clinic.  To cancel or reschedule your appointment with Pain Management please remember to call 24 hours in advance to avoid a fee.  Refer to the educational materials which you have been given on: General Risks, I had my Procedure. Discharge Instructions, Post Sedation.  Post Procedure Instructions:  The drugs you were given will stay in your system until tomorrow, so for the next 24 hours you should not drive, make any legal decisions or drink any alcoholic beverages.  You may eat anything you prefer, but it is better to start with liquids then soups and crackers, and gradually work up to solid foods.  Please notify your doctor immediately if you have any unusual bleeding, trouble breathing or pain that is not related to your normal pain.  Depending on the type of procedure that was done, some parts of your body may feel week and/or numb.  This usually clears up by tonight or the next day.  Walk with the use of  an assistive device or accompanied by an adult for the 24 hours.  You may use ice on the affected area for the first 24 hours.  Put ice in a Ziploc bag and cover with a towel and place against area 15 minutes on 15 minutes off.  You may switch to heat after 24 hours.

## 2016-04-05 NOTE — Progress Notes (Signed)
Safety precautions to be maintained throughout the outpatient stay will include: orient to surroundings, keep bed in low position, maintain call bell within reach at all times, provide assistance with transfer out of bed and ambulation.  

## 2016-04-06 ENCOUNTER — Telehealth: Payer: Self-pay

## 2016-04-06 NOTE — Telephone Encounter (Signed)
Post procedure phone call.  Left message.  

## 2016-05-03 DIAGNOSIS — C4491 Basal cell carcinoma of skin, unspecified: Secondary | ICD-10-CM

## 2016-05-03 HISTORY — DX: Basal cell carcinoma of skin, unspecified: C44.91

## 2016-05-29 ENCOUNTER — Encounter
Admission: EM | Disposition: A | Discharge status: Skilled Nursing Facility | Payer: Self-pay | Source: Home / Self Care | Attending: Internal Medicine

## 2016-05-29 ENCOUNTER — Encounter: Payer: Self-pay | Admitting: Anesthesiology

## 2016-05-29 ENCOUNTER — Inpatient Hospital Stay
Admit: 2016-05-29 | Discharge: 2016-05-29 | Disposition: A | Discharge status: Home / Self Care | Payer: Medicare PPO | Attending: Internal Medicine | Admitting: Internal Medicine

## 2016-05-29 ENCOUNTER — Encounter: Payer: Self-pay | Admitting: *Deleted

## 2016-05-29 ENCOUNTER — Emergency Department: Payer: Medicare PPO

## 2016-05-29 ENCOUNTER — Inpatient Hospital Stay
Admission: EM | Admit: 2016-05-29 | Discharge: 2016-06-04 | DRG: 481 | Disposition: A | Discharge status: Skilled Nursing Facility | Payer: Medicare PPO | Attending: Internal Medicine | Admitting: Internal Medicine

## 2016-05-29 DIAGNOSIS — R0600 Dyspnea, unspecified: Secondary | ICD-10-CM

## 2016-05-29 DIAGNOSIS — I951 Orthostatic hypotension: Secondary | ICD-10-CM | POA: Diagnosis not present

## 2016-05-29 DIAGNOSIS — E785 Hyperlipidemia, unspecified: Secondary | ICD-10-CM | POA: Diagnosis present

## 2016-05-29 DIAGNOSIS — M47816 Spondylosis without myelopathy or radiculopathy, lumbar region: Secondary | ICD-10-CM

## 2016-05-29 DIAGNOSIS — F419 Anxiety disorder, unspecified: Secondary | ICD-10-CM | POA: Diagnosis present

## 2016-05-29 DIAGNOSIS — Z825 Family history of asthma and other chronic lower respiratory diseases: Secondary | ICD-10-CM | POA: Diagnosis not present

## 2016-05-29 DIAGNOSIS — S72142A Displaced intertrochanteric fracture of left femur, initial encounter for closed fracture: Principal | ICD-10-CM | POA: Diagnosis present

## 2016-05-29 DIAGNOSIS — M6281 Muscle weakness (generalized): Secondary | ICD-10-CM

## 2016-05-29 DIAGNOSIS — T43205A Adverse effect of unspecified antidepressants, initial encounter: Secondary | ICD-10-CM | POA: Diagnosis not present

## 2016-05-29 DIAGNOSIS — W06XXXA Fall from bed, initial encounter: Secondary | ICD-10-CM | POA: Diagnosis present

## 2016-05-29 DIAGNOSIS — S72002A Fracture of unspecified part of neck of left femur, initial encounter for closed fracture: Secondary | ICD-10-CM

## 2016-05-29 DIAGNOSIS — K219 Gastro-esophageal reflux disease without esophagitis: Secondary | ICD-10-CM | POA: Diagnosis present

## 2016-05-29 DIAGNOSIS — M25552 Pain in left hip: Secondary | ICD-10-CM | POA: Diagnosis not present

## 2016-05-29 DIAGNOSIS — Z90710 Acquired absence of both cervix and uterus: Secondary | ICD-10-CM

## 2016-05-29 DIAGNOSIS — Z8249 Family history of ischemic heart disease and other diseases of the circulatory system: Secondary | ICD-10-CM | POA: Diagnosis not present

## 2016-05-29 DIAGNOSIS — F329 Major depressive disorder, single episode, unspecified: Secondary | ICD-10-CM | POA: Diagnosis present

## 2016-05-29 DIAGNOSIS — S72002G Fracture of unspecified part of neck of left femur, subsequent encounter for closed fracture with delayed healing: Secondary | ICD-10-CM

## 2016-05-29 DIAGNOSIS — I952 Hypotension due to drugs: Secondary | ICD-10-CM | POA: Diagnosis not present

## 2016-05-29 DIAGNOSIS — S72002K Fracture of unspecified part of neck of left femur, subsequent encounter for closed fracture with nonunion: Secondary | ICD-10-CM

## 2016-05-29 DIAGNOSIS — R000 Tachycardia, unspecified: Secondary | ICD-10-CM

## 2016-05-29 DIAGNOSIS — I10 Essential (primary) hypertension: Secondary | ICD-10-CM | POA: Diagnosis present

## 2016-05-29 DIAGNOSIS — Z87891 Personal history of nicotine dependence: Secondary | ICD-10-CM

## 2016-05-29 DIAGNOSIS — Z818 Family history of other mental and behavioral disorders: Secondary | ICD-10-CM

## 2016-05-29 DIAGNOSIS — Z833 Family history of diabetes mellitus: Secondary | ICD-10-CM

## 2016-05-29 DIAGNOSIS — D62 Acute posthemorrhagic anemia: Secondary | ICD-10-CM | POA: Diagnosis not present

## 2016-05-29 DIAGNOSIS — Z01811 Encounter for preprocedural respiratory examination: Secondary | ICD-10-CM

## 2016-05-29 DIAGNOSIS — N390 Urinary tract infection, site not specified: Secondary | ICD-10-CM | POA: Diagnosis not present

## 2016-05-29 DIAGNOSIS — R55 Syncope and collapse: Secondary | ICD-10-CM

## 2016-05-29 DIAGNOSIS — S72009A Fracture of unspecified part of neck of unspecified femur, initial encounter for closed fracture: Secondary | ICD-10-CM | POA: Diagnosis present

## 2016-05-29 DIAGNOSIS — T40605A Adverse effect of unspecified narcotics, initial encounter: Secondary | ICD-10-CM | POA: Diagnosis not present

## 2016-05-29 DIAGNOSIS — S72002S Fracture of unspecified part of neck of left femur, sequela: Secondary | ICD-10-CM | POA: Diagnosis present

## 2016-05-29 DIAGNOSIS — Z419 Encounter for procedure for purposes other than remedying health state, unspecified: Secondary | ICD-10-CM

## 2016-05-29 HISTORY — DX: Fracture of unspecified part of neck of left femur, subsequent encounter for closed fracture with nonunion: S72.002K

## 2016-05-29 HISTORY — PX: HIP ARTHROSCOPY W/ LABRAL REPAIR: SHX1750

## 2016-05-29 HISTORY — DX: Fracture of unspecified part of neck of left femur, subsequent encounter for closed fracture with delayed healing: S72.002G

## 2016-05-29 LAB — BASIC METABOLIC PANEL
ANION GAP: 8 (ref 5–15)
BUN: 6 mg/dL (ref 6–20)
CALCIUM: 8.6 mg/dL — AB (ref 8.9–10.3)
CO2: 24 mmol/L (ref 22–32)
CREATININE: 0.62 mg/dL (ref 0.44–1.00)
Chloride: 108 mmol/L (ref 101–111)
GFR calc Af Amer: >60 mL/min (ref >60)
GLUCOSE: 158 mg/dL — AB (ref 65–99)
Potassium: 3.3 mmol/L — ABNORMAL LOW (ref 3.5–5.1)
Sodium: 140 mmol/L (ref 135–145)

## 2016-05-29 LAB — TROPONIN I

## 2016-05-29 LAB — ECHOCARDIOGRAM COMPLETE
Height: 61 in
Weight: 2339.2 oz

## 2016-05-29 LAB — CBC
HCT: 36.1 % (ref 35.0–47.0)
HEMOGLOBIN: 12.9 g/dL (ref 12.0–16.0)
MCH: 36.2 pg — ABNORMAL HIGH (ref 26.0–34.0)
MCHC: 35.8 g/dL (ref 32.0–36.0)
MCV: 101.3 fL — ABNORMAL HIGH (ref 80.0–100.0)
PLATELETS: 226 10*3/uL (ref 150–440)
RBC: 3.56 MIL/uL — ABNORMAL LOW (ref 3.80–5.20)
RDW: 13 % (ref 11.5–14.5)
WBC: 9.3 10*3/uL (ref 3.6–11.0)

## 2016-05-29 LAB — TSH: TSH: 5.193 u[IU]/mL — ABNORMAL HIGH (ref 0.350–4.500)

## 2016-05-29 SURGERY — INSERTION, INTRAMEDULLARY ROD, FEMUR
Anesthesia: Choice | Laterality: Left

## 2016-05-29 MED ORDER — ACETAMINOPHEN 325 MG PO TABS
650.0000 mg | ORAL_TABLET | Freq: Four times a day (QID) | ORAL | Status: DC | PRN
  Administered 2016-05-29: 650 mg via ORAL
  Filled 2016-05-29: qty 2

## 2016-05-29 MED ORDER — ATORVASTATIN CALCIUM 20 MG PO TABS
40.0000 mg | ORAL_TABLET | Freq: Every day | ORAL | Status: DC
  Administered 2016-05-29 – 2016-06-03 (×6): 40 mg via ORAL
  Filled 2016-05-29 (×6): qty 2

## 2016-05-29 MED ORDER — SODIUM CHLORIDE 0.9% FLUSH
3.0000 mL | Freq: Two times a day (BID) | INTRAVENOUS | Status: DC
  Administered 2016-05-29: 3 mL via INTRAVENOUS

## 2016-05-29 MED ORDER — MORPHINE SULFATE (PF) 4 MG/ML IV SOLN
4.0000 mg | Freq: Once | INTRAVENOUS | Status: AC
  Administered 2016-05-29: 4 mg via INTRAVENOUS

## 2016-05-29 MED ORDER — CLONAZEPAM 0.5 MG PO TABS
0.5000 mg | ORAL_TABLET | Freq: Three times a day (TID) | ORAL | Status: DC | PRN
  Administered 2016-05-29 – 2016-05-31 (×2): 0.5 mg via ORAL
  Filled 2016-05-29 (×2): qty 1

## 2016-05-29 MED ORDER — ONDANSETRON HCL 4 MG PO TABS: 4.0000 mg | ORAL_TABLET | Freq: Four times a day (QID) | ORAL | Status: DC | PRN

## 2016-05-29 MED ORDER — SODIUM CHLORIDE 0.9 % IV BOLUS (SEPSIS)
1000.0000 mL | Freq: Once | INTRAVENOUS | Status: AC
  Administered 2016-05-29: 1000 mL via INTRAVENOUS

## 2016-05-29 MED ORDER — MORPHINE SULFATE (PF) 4 MG/ML IV SOLN
4.0000 mg | INTRAVENOUS | Status: DC | PRN
  Administered 2016-05-29 (×4): 4 mg via INTRAVENOUS
  Filled 2016-05-29 (×4): qty 1

## 2016-05-29 MED ORDER — HYDROMORPHONE HCL 1 MG/ML IJ SOLN
0.5000 mg | Freq: Once | INTRAMUSCULAR | Status: AC
  Administered 2016-05-29: 0.5 mg via INTRAVENOUS

## 2016-05-29 MED ORDER — DICLOFENAC SODIUM 75 MG PO TBEC: 75.0000 mg | DELAYED_RELEASE_TABLET | Freq: Two times a day (BID) | ORAL | Status: DC

## 2016-05-29 MED ORDER — VALACYCLOVIR HCL 500 MG PO TABS
1000.0000 mg | ORAL_TABLET | Freq: Every day | ORAL | Status: DC
  Administered 2016-05-29 – 2016-06-04 (×6): 1000 mg via ORAL
  Filled 2016-05-29 (×6): qty 2

## 2016-05-29 MED ORDER — MORPHINE SULFATE (PF) 4 MG/ML IV SOLN
INTRAVENOUS | Status: AC
  Filled 2016-05-29: qty 1

## 2016-05-29 MED ORDER — ONDANSETRON HCL 4 MG/2ML IJ SOLN
4.0000 mg | Freq: Once | INTRAMUSCULAR | Status: AC
  Administered 2016-05-29: 4 mg via INTRAVENOUS

## 2016-05-29 MED ORDER — BUPROPION HCL ER (XL) 150 MG PO TB24
300.0000 mg | ORAL_TABLET | Freq: Every day | ORAL | Status: DC
  Filled 2016-05-29: qty 2

## 2016-05-29 MED ORDER — ACETAMINOPHEN 650 MG RE SUPP: 650.0000 mg | Freq: Four times a day (QID) | RECTAL | Status: DC | PRN

## 2016-05-29 MED ORDER — SERTRALINE HCL 50 MG PO TABS
50.0000 mg | ORAL_TABLET | Freq: Every day | ORAL | Status: DC
  Administered 2016-05-29 – 2016-06-03 (×6): 50 mg via ORAL
  Filled 2016-05-29 (×6): qty 1

## 2016-05-29 MED ORDER — HYDROMORPHONE HCL 1 MG/ML IJ SOLN
INTRAMUSCULAR | Status: AC
  Filled 2016-05-29: qty 1

## 2016-05-29 MED ORDER — DOCUSATE SODIUM 100 MG PO CAPS
100.0000 mg | ORAL_CAPSULE | Freq: Two times a day (BID) | ORAL | Status: DC
  Administered 2016-05-29 (×2): 100 mg via ORAL
  Filled 2016-05-29 (×2): qty 1

## 2016-05-29 MED ORDER — HYDROMORPHONE HCL 1 MG/ML IJ SOLN
1.0000 mg | Freq: Once | INTRAMUSCULAR | Status: AC
  Administered 2016-05-29: 1 mg via INTRAVENOUS

## 2016-05-29 MED ORDER — OXYCODONE-ACETAMINOPHEN 7.5-325 MG PO TABS
1.0000 | ORAL_TABLET | ORAL | Status: DC | PRN
  Administered 2016-05-29 (×2): 2 via ORAL
  Filled 2016-05-29 (×2): qty 2

## 2016-05-29 MED ORDER — ONDANSETRON HCL 4 MG/2ML IJ SOLN
4.0000 mg | Freq: Four times a day (QID) | INTRAMUSCULAR | Status: DC | PRN
Start: 2016-05-29 — End: 2016-05-30
  Administered 2016-05-30: 4 mg via INTRAVENOUS

## 2016-05-29 MED ORDER — PANTOPRAZOLE SODIUM 40 MG PO TBEC
40.0000 mg | DELAYED_RELEASE_TABLET | Freq: Two times a day (BID) | ORAL | Status: DC
  Administered 2016-05-29 (×2): 40 mg via ORAL
  Filled 2016-05-29 (×2): qty 1

## 2016-05-29 MED ORDER — NADOLOL 20 MG PO TABS
20.0000 mg | ORAL_TABLET | Freq: Every day | ORAL | Status: DC
  Administered 2016-05-29 – 2016-05-31 (×2): 20 mg via ORAL
  Filled 2016-05-29 (×3): qty 1

## 2016-05-29 MED ORDER — POTASSIUM CHLORIDE IN NACL 40-0.9 MEQ/L-% IV SOLN
INTRAVENOUS | Status: DC
  Administered 2016-05-29 – 2016-05-30 (×2): 125 mL/h via INTRAVENOUS
  Filled 2016-05-29 (×6): qty 1000

## 2016-05-29 MED ORDER — PRAZOSIN HCL 2 MG PO CAPS
2.0000 mg | ORAL_CAPSULE | Freq: Every day | ORAL | Status: DC
  Administered 2016-05-29 – 2016-05-31 (×3): 2 mg via ORAL
  Filled 2016-05-29 (×3): qty 1

## 2016-05-29 MED ORDER — BUSPIRONE HCL 10 MG PO TABS
15.0000 mg | ORAL_TABLET | Freq: Two times a day (BID) | ORAL | Status: DC
  Filled 2016-05-29: qty 1

## 2016-05-29 MED ORDER — ONDANSETRON HCL 4 MG/2ML IJ SOLN
INTRAMUSCULAR | Status: AC
  Filled 2016-05-29: qty 2

## 2016-05-29 SURGICAL SUPPLY — 30 items
BIT DRILL AO GAMMA 4.2X180 (BIT) ×1 IMPLANT
BIT DRILL AO GAMMA 4.2X300 (BIT) ×1 IMPLANT
CANISTER SUCT 1200ML W/VALVE (MISCELLANEOUS) ×1 IMPLANT
DRSG AQUACEL AG ADV 3.5X 4 (GAUZE/BANDAGES/DRESSINGS) ×1 IMPLANT
DRSG AQUACEL AG ADV 3.5X10 (GAUZE/BANDAGES/DRESSINGS) ×1 IMPLANT
DRSG AQUACEL AG ADV 3.5X14 (GAUZE/BANDAGES/DRESSINGS) ×1 IMPLANT
DURAPREP 26ML APPLICATOR (WOUND CARE) ×2 IMPLANT
ELECT REM PT RETURN 9FT ADLT (ELECTROSURGICAL)
ELECTRODE REM PT RTRN 9FT ADLT (ELECTROSURGICAL) ×1 IMPLANT
GLOVE BIOGEL PI IND STRL 8 (GLOVE) ×1 IMPLANT
GLOVE BIOGEL PI INDICATOR 8 (GLOVE)
GLOVE ORTHO TXT STRL SZ7.5 (GLOVE) ×1 IMPLANT
GOWN STRL REUS W/ TWL LRG LVL3 (GOWN DISPOSABLE) ×2 IMPLANT
GOWN STRL REUS W/TWL LRG LVL3 (GOWN DISPOSABLE)
GUIDEROD T2 3X1000 (ROD) ×1 IMPLANT
K-WIRE  3.2X450M STR (WIRE)
K-WIRE 3.2X450M STR (WIRE)
KIT RM TURNOVER CYSTO AR (KITS) ×1 IMPLANT
KWIRE 3.2X450M STR (WIRE) ×1 IMPLANT
MAT BLUE FLOOR 46X72 FLO (MISCELLANEOUS) ×1 IMPLANT
NS IRRIG 500ML POUR BTL (IV SOLUTION) ×1 IMPLANT
PACK HIP COMPR (MISCELLANEOUS) ×1 IMPLANT
REAMER ROD DEEP FLUTE 2.5X950 (INSTRUMENTS) ×1 IMPLANT
REAMER SHAFT BIXCUT (INSTRUMENTS) ×1 IMPLANT
STAPLER SKIN PROX 35W (STAPLE) ×1 IMPLANT
SUCTION FRAZIER HANDLE 10FR (MISCELLANEOUS)
SUCTION TUBE FRAZIER 10FR DISP (MISCELLANEOUS) ×1 IMPLANT
SUT MON AB 2-0 CT1 36 (SUTURE) ×1 IMPLANT
SUT VIC AB 2-0 CT1 27 (SUTURE)
SUT VIC AB 2-0 CT1 TAPERPNT 27 (SUTURE) ×1 IMPLANT

## 2016-05-29 NOTE — Progress Notes (Signed)
Pt states she no longer takes wellburtin or buspar. Will notify md.

## 2016-05-29 NOTE — Consult Note (Signed)
ORTHOPAEDIC CONSULTATION  PATIENT NAME: Wanda [name] DOB: [birth year]  MRN: [id number]  REQUESTING PHYSICIAN: Dustin Flock, MD  Chief Complaint: Left intertrochanteric hip fracture  HPI: Wanda [name] is a 61 y.o. female seen in consultation at the request of Dustin Flock, MD for a left intertrochanteric hip fracture.  She was getting out of bed early this morning to use the restroom when she had a syncopal event and fell to the floor.  She states that she awakened and had left hip pain.  She denies pain other than the left hip.  She denies other recent syncope.  Past Medical History:  Diagnosis Date  . Anxiety   . BRONCHITIS, ACUTE WITH BRONCHOSPASM 06/25/2010   Qualifier: Diagnosis of  By: Alveta Heimlich MD, Cornelia Copa    . Depression   . GASTROENTERITIS WITHOUT DEHYDRATION 05/14/2009   Qualifier: Diagnosis of  By: Deborra Medina MD, Tanja Port    . GERD (gastroesophageal reflux disease)   . Herpes   . Hyperlipidemia   . Irregular heart beat    Past Surgical History:  Procedure Laterality Date  . ABDOMINAL HYSTERECTOMY    . APPENDECTOMY    . BACK SURGERY     X 3  . BREAST EXCISIONAL BIOPSY Right    surgical bx age 14   . COLONOSCOPY N/A 08/28/2013   Procedure: COLONOSCOPY;  Surgeon: Danie Binder, MD;  Location: [facility];  Service: Endoscopy;  Laterality: N/A;  8:30 AM  . SHOULDER SURGERY    . Spinal cord stimulator     Social History   Social History  . Marital status: Single    Spouse name: N/A  . Number of children: N/A  . Years of education: N/A   Social History Main Topics  . Smoking status: Former Smoker    Packs/day: 0.25    Years: 14.00    Types: Cigarettes  . Smokeless tobacco: Former Systems developer    Quit date: 08/29/1990  . Alcohol use Yes     Comment: occasional  . Drug use: No  . Sexual activity: Yes    Birth control/ protection: Surgical   Other Topics Concern  . Not on file   Social History Narrative  . No narrative on file   Family History  Problem Relation Age  of Onset  . Diabetes Mother   . COPD Mother   . Heart disease Mother   . Diabetes Father   . COPD Father   . Heart disease Father   . Alcohol abuse Daughter   . Depression Daughter   . Tuberculosis Maternal Aunt   . Cancer Paternal Aunt     breast  . Diabetes Paternal Grandmother   . Diabetes Sister   . Cancer Cousin   . Diabetes Cousin   . Colon cancer Neg Hx    Allergies  Allergen Reactions  . Codeine     REACTION: N \\T \ V  . Levofloxacin     REACTION: N \\T \ V  . Propoxyphene Hcl     REACTION: N \\T \ V   Prior to Admission medications   Medication Sig Start Date End Date Taking? Authorizing Provider  atorvastatin (LIPITOR) 40 MG tablet Take by mouth.   Yes Historical Provider, MD  clonazePAM (KLONOPIN) 0.5 MG tablet take 1/2 tablet twice a day and 1/2 to 1 tablet at bedtime 03/20/16  Yes Historical Provider, MD  Garlic [id number] MG CAPS Take 1 capsule by mouth daily.   Yes Historical Provider, MD  Multiple Vitamin (MULTIVITAMIN) tablet Take 1  tablet by mouth daily.   Yes Historical Provider, MD  nadolol (CORGARD) 20 MG tablet Take 20 mg by mouth daily.     Yes Historical Provider, MD  pantoprazole (PROTONIX) 40 MG tablet Take 40 mg by mouth 2 (two) times daily.    Yes Historical Provider, MD  prazosin (MINIPRESS) 2 MG capsule Take 2 mg by mouth at bedtime. 01/10/16  Yes Historical Provider, MD  QUEtiapine (SEROQUEL) 25 MG tablet Take 1-2 tablets by mouth at bedtime. 05/05/16  Yes Historical Provider, MD  sertraline (ZOLOFT) 50 MG tablet take 1/2 tablet by mouth once daily for 7 days then 1 tablet once daily THERAFTER 04/03/16  Yes Historical Provider, MD  valACYclovir (VALTREX) 1000 MG tablet Take 1 tablet (1,000 mg total) by mouth daily. 07/31/13  Yes Estill Dooms, NP  buPROPion (WELLBUTRIN XL) 300 MG 24 hr tablet Take 300 mg by mouth daily. 02/22/16   Historical Provider, MD  busPIRone (BUSPAR) 15 MG tablet Take 15 mg by mouth 2 (two) times daily.    Historical Provider, MD   diclofenac (VOLTAREN) 75 MG EC tablet Take 75 mg by mouth 2 (two) times daily. 02/09/16   Historical Provider, MD   Ct Head Wo Contrast  Result Date: 05/29/2016 CLINICAL DATA:  Fall with hip fracture, syncopal episode EXAM: CT HEAD WITHOUT CONTRAST TECHNIQUE: Contiguous axial images were obtained from the base of the skull through the vertex without intravenous contrast. COMPARISON:  None. FINDINGS: Brain: No evidence for acute territorial infarction, intracranial hemorrhage or focal mass lesion. No midline shift. Ventricles are nonenlarged. Minimal atrophy. Vascular: No hyperdense vessels.  No unexpected calcifications. Skull: Mastoid air cells are clear. No skull fracture is visualized. Sinuses/Orbits: Minimal fluid within the left sphenoid sinus. Mild mucosal thickening in the ethmoid sinuses. No acute orbital abnormality. Other: None IMPRESSION: No CT evidence for acute intracranial abnormality. Electronically Signed   By: [name] M.D.   On: 05/29/2016 03:41   Dg Chest 1 View  Result Date: 05/29/2016 CLINICAL DATA:  Preop, fall with hip fracture EXAM: CHEST 1 VIEW COMPARISON:  06/23/2010 FINDINGS: There is no focal pulmonary infiltrate, consolidation or effusion. Cardiomediastinal silhouette is nonenlarged. There is no pneumothorax. Stimulator leads are visualized over the spine. IMPRESSION: No radiographic evidence for acute cardiopulmonary abnormality Electronically Signed   By: [name] M.D.   On: 05/29/2016 03:37   Dg Hip Unilat W Or Wo Pelvis 2-3 Views Left  Result Date: 05/29/2016 CLINICAL DATA:  Fall with left hip pain EXAM: DG HIP (WITH OR WITHOUT PELVIS) 2-3V LEFT COMPARISON:  10/18/2015 FINDINGS: Left lower quadrant generator again noted. The right femoral head appears normally positioned and there are degenerative changes of the right hip as before. Pubic symphysis appears intact. Mildly comminuted intertrochanteric fracture of the proximal left femur are with mild varus  angulation. Left femoral head projects in joint. IMPRESSION: Acute mildly angulated left intertrochanteric fracture. Electronically Signed   By: [name] M.D.   On: 05/29/2016 03:35    Positive ROS: All other systems have been reviewed and were otherwise negative with the exception of those mentioned in the HPI and as above.  Physical Exam: General: Alert and alert in no acute distress. HEENT: Atraumatic and normocephalic. Sclera are clear. Extraocular motion is intact.  Cardiovascular: Pedal pulses are palpable bilaterally.  No significant pretibial or ankle edema. Skin: No lesions in the area of chief complaint Neurologic: Awake, alert, and oriented. Sensory function is grossly intact. Motor strength is felt to  be 5 over 5 bilaterally. No clonus or tremor. Good motor coordination. MUSCULOSKELETAL: Pain with gentle motion of left hip.  Tenderness of left hip.  No knee effusion.  Left leg shortened and externally rotated.    Imaging: Left intertrochanteric hip fracture  Assessment: 61 year old female with left intertrochanteric hip fracture sustained during syncopal event.  Plan: I have recommended operative fixation with a cephalomedullary device.  We discussed the risks and benefits of surgery and we discussed there perioperative course in detail.  She has agreed to proceed.  She lives alone so would likely need rehabilitation postoperatively.  After speaking with the operating room, I can perform surgery this afternoon if she is cleared from a medical standpoint after the syncopal event.  Symeon Puleo K. Page Spiro, MD

## 2016-05-29 NOTE — Clinical Social Work Placement (Signed)
   CLINICAL SOCIAL WORK PLACEMENT  NOTE  Date:  05/29/2016  Patient Details  Name: JANYSSA MISENCIK MRN: [id number] Date of Birth: 1956-05-15  Clinical Social Work is seeking post-discharge placement for this patient at the [place] level of care (*CSW will initial, date and re-position this form in  chart as items are completed):  Yes   Patient/family provided with [facility] Work Department's list of facilities offering this level of care within the geographic area requested by the patient (or if unable, by the patient's family).  Yes   Patient/family informed of their freedom to choose among providers that offer the needed level of care, that participate in Medicare, Medicaid or managed care program needed by the patient, have an available bed and are willing to accept the patient.  Yes   Patient/family informed of [facility]'s ownership interest in [facility] and [facility], as well as of the fact that they are under no obligation to receive care at these facilities.  PASRR submitted to EDS on 05/29/16     PASRR number received on 05/29/16     Existing PASRR number confirmed on       FL2 transmitted to all facilities in geographic area requested by pt/family on 05/29/16     FL2 transmitted to all facilities within larger geographic area on       Patient informed that his/her managed care company has contracts with or will negotiate with certain facilities, including the following:            Patient/family informed of bed offers received.  Patient chooses bed at       Physician recommends and patient chooses bed at      Patient to be transferred to   on  .  Patient to be transferred to facility by       Patient family notified on   of transfer.  Name of family member notified:        PHYSICIAN       Additional Comment:    _______________________________________________ Arleatha Philipps, Veronia Beets, LCSW 05/29/2016, 2:11 PM

## 2016-05-29 NOTE — H&P (Signed)
Wanda [name] is an 61 y.o. female.   Chief Complaint: Fall HPI: The patient with past medical history of chronic pain as well as irregular heartbeat presents emergency department after suffering a fall. She states that she "blacked out". She vaguely has some recollection of a prodrome prior to her syncope and collapse but does not recall if she had any chest pain or palpitations. She denies both of those symptoms at this time. After falling she was unable to stand and had her pain in her left hip. She slid across the floor where she was able to pull a call bell to alert help. In the emergency department she was found to have a intertrochanteric fracture of the left femur. CT of her head was normal. The hospitalist service was contacted for medical management of syncope and preoperative clearance prior to orthopedic surgery.  Past Medical History:  Diagnosis Date  . Anxiety   . BRONCHITIS, ACUTE WITH BRONCHOSPASM 06/25/2010   Qualifier: Diagnosis of  By: Wade MD, Eugene    . Depression   . GASTROENTERITIS WITHOUT DEHYDRATION 05/14/2009   Qualifier: Diagnosis of  By: Aron MD, Talia    . GERD (gastroesophageal reflux disease)   . Herpes   . Hyperlipidemia   . Irregular heart beat     Past Surgical History:  Procedure Laterality Date  . ABDOMINAL HYSTERECTOMY    . APPENDECTOMY    . BACK SURGERY     X 3  . BREAST EXCISIONAL BIOPSY Right    surgical bx age 14   . COLONOSCOPY N/A 08/28/2013   Procedure: COLONOSCOPY;  Surgeon: Sandi L Fields, MD;  Location: [facility];  Service: Endoscopy;  Laterality: N/A;  8:30 AM  . SHOULDER SURGERY    . Spinal cord stimulator      Family History  Problem Relation Age of Onset  . Diabetes Mother   . COPD Mother   . Heart disease Mother   . Diabetes Father   . COPD Father   . Heart disease Father   . Alcohol abuse Daughter   . Depression Daughter   . Tuberculosis Maternal Aunt   . Cancer Paternal Aunt     breast  . Diabetes Paternal  Grandmother   . Diabetes Sister   . Cancer Cousin   . Diabetes Cousin   . Colon cancer Neg Hx    Social History:  reports that she has quit smoking. Her smoking use included Cigarettes. She has a 3.50 pack-year smoking history. She quit smokeless tobacco use about 25 years ago. She reports that she drinks alcohol. She reports that she does not use drugs.  Allergies:  Allergies  Allergen Reactions  . Codeine     REACTION: N \T\ V  . Levofloxacin     REACTION: N \T\ V  . Propoxyphene Hcl     REACTION: N \T\ V    Facility-Administered Medications Prior to Admission  Medication Dose Route Frequency Provider Last Rate Last Dose  . fentaNYL (SUBLIMAZE) injection 25-50 mcg  25-50 mcg Intravenous Q5 min PRN Francisco Naveira, MD   50 mcg at 04/17/16 1118  . lactated ringers infusion 1,000 mL  1,000 mL Intravenous Once Francisco Naveira, MD      . midazolam (VERSED) 5 MG/5ML injection 1-2 mg  1-2 mg Intravenous Q5 min PRN Francisco Naveira, MD   2 mg at 04/17/16 1118   Medications Prior to Admission  Medication Sig Dispense Refill  . atorvastatin (LIPITOR) 40 MG tablet Take by   mouth.    . clonazePAM (KLONOPIN) 0.5 MG tablet take 1/2 tablet twice a day and 1/2 to 1 tablet at bedtime  0  . Garlic 1000 MG CAPS Take 1 capsule by mouth daily.    . Multiple Vitamin (MULTIVITAMIN) tablet Take 1 tablet by mouth daily.    . nadolol (CORGARD) 20 MG tablet Take 20 mg by mouth daily.      . pantoprazole (PROTONIX) 40 MG tablet Take 40 mg by mouth 2 (two) times daily.     . prazosin (MINIPRESS) 2 MG capsule Take 2 mg by mouth at bedtime.  0  . QUEtiapine (SEROQUEL) 25 MG tablet Take 1-2 tablets by mouth at bedtime.  0  . sertraline (ZOLOFT) 50 MG tablet take 1/2 tablet by mouth once daily for 7 days then 1 tablet once daily THERAFTER  0  . valACYclovir (VALTREX) 1000 MG tablet Take 1 tablet (1,000 mg total) by mouth daily. 90 tablet 4  . buPROPion (WELLBUTRIN XL) 300 MG 24 hr tablet Take 300 mg by  mouth daily.  0  . busPIRone (BUSPAR) 15 MG tablet Take 15 mg by mouth 2 (two) times daily.    . diclofenac (VOLTAREN) 75 MG EC tablet Take 75 mg by mouth 2 (two) times daily.  0    Results for orders placed or performed during the hospital encounter of 05/29/16 (from the past 48 hour(s))  Basic metabolic panel     Status: Abnormal   Collection Time: 05/29/16  3:04 AM  Result Value Ref Range   Sodium 140 135 - 145 mmol/L   Potassium 3.3 (L) 3.5 - 5.1 mmol/L   Chloride 108 101 - 111 mmol/L   CO2 24 22 - 32 mmol/L   Glucose, Bld 158 (H) 65 - 99 mg/dL   BUN 6 6 - 20 mg/dL   Creatinine, Ser 0.62 0.44 - 1.00 mg/dL   Calcium 8.6 (L) 8.9 - 10.3 mg/dL   GFR calc non Af Amer >60 >60 mL/min   GFR calc Af Amer >60 >60 mL/min    Comment: (NOTE) The eGFR has been calculated using the CKD EPI equation. This calculation has not been validated in all clinical situations. eGFR's persistently <60 mL/min signify possible Chronic Kidney Disease.    Anion gap 8 5 - 15  CBC     Status: Abnormal   Collection Time: 05/29/16  3:04 AM  Result Value Ref Range   WBC 9.3 3.6 - 11.0 K/uL   RBC 3.56 (L) 3.80 - 5.20 MIL/uL   Hemoglobin 12.9 12.0 - 16.0 g/dL   HCT 36.1 35.0 - 47.0 %   MCV 101.3 (H) 80.0 - 100.0 fL   MCH 36.2 (H) 26.0 - 34.0 pg   MCHC 35.8 32.0 - 36.0 g/dL   RDW 13.0 11.5 - 14.5 %   Platelets 226 150 - 440 K/uL  Troponin I     Status: None   Collection Time: 05/29/16  3:04 AM  Result Value Ref Range   Troponin I <0.03 <0.03 ng/mL   Ct Head Wo Contrast  Result Date: 05/29/2016 CLINICAL DATA:  Fall with hip fracture, syncopal episode EXAM: CT HEAD WITHOUT CONTRAST TECHNIQUE: Contiguous axial images were obtained from the base of the skull through the vertex without intravenous contrast. COMPARISON:  None. FINDINGS: Brain: No evidence for acute territorial infarction, intracranial hemorrhage or focal mass lesion. No midline shift. Ventricles are nonenlarged. Minimal atrophy. Vascular: No  hyperdense vessels.  No unexpected calcifications. Skull: Mastoid air cells   are clear. No skull fracture is visualized. Sinuses/Orbits: Minimal fluid within the left sphenoid sinus. Mild mucosal thickening in the ethmoid sinuses. No acute orbital abnormality. Other: None IMPRESSION: No CT evidence for acute intracranial abnormality. Electronically Signed   By: Kim  Fujinaga M.D.   On: 05/29/2016 03:41   Dg Chest 1 View  Result Date: 05/29/2016 CLINICAL DATA:  Preop, fall with hip fracture EXAM: CHEST 1 VIEW COMPARISON:  06/23/2010 FINDINGS: There is no focal pulmonary infiltrate, consolidation or effusion. Cardiomediastinal silhouette is nonenlarged. There is no pneumothorax. Stimulator leads are visualized over the spine. IMPRESSION: No radiographic evidence for acute cardiopulmonary abnormality Electronically Signed   By: Kim  Fujinaga M.D.   On: 05/29/2016 03:37   Dg Hip Unilat W Or Wo Pelvis 2-3 Views Left  Result Date: 05/29/2016 CLINICAL DATA:  Fall with left hip pain EXAM: DG HIP (WITH OR WITHOUT PELVIS) 2-3V LEFT COMPARISON:  10/18/2015 FINDINGS: Left lower quadrant generator again noted. The right femoral head appears normally positioned and there are degenerative changes of the right hip as before. Pubic symphysis appears intact. Mildly comminuted intertrochanteric fracture of the proximal left femur are with mild varus angulation. Left femoral head projects in joint. IMPRESSION: Acute mildly angulated left intertrochanteric fracture. Electronically Signed   By: Kim  Fujinaga M.D.   On: 05/29/2016 03:35    Review of Systems  Constitutional: Negative for chills and fever.  HENT: Negative for sore throat and tinnitus.   Eyes: Negative for blurred vision and redness.  Respiratory: Negative for cough and shortness of breath.   Cardiovascular: Negative for chest pain, palpitations, orthopnea and PND.  Gastrointestinal: Negative for abdominal pain, diarrhea, nausea and vomiting.  Genitourinary:  Negative for dysuria, frequency and urgency.  Musculoskeletal: Positive for falls and joint pain (left hip). Negative for myalgias.  Skin: Negative for rash.       No lesions  Neurological: Positive for loss of consciousness. Negative for speech change, focal weakness and weakness.  Endo/Heme/Allergies: Does not bruise/bleed easily.       No temperature intolerance  Psychiatric/Behavioral: Negative for depression and suicidal ideas.    Blood pressure 113/69, pulse 72, temperature 98.4 F (36.9 C), temperature source Oral, resp. rate 19, height 5' 1" (1.549 m), weight 62.6 kg (138 lb), SpO2 100 %. Physical Exam  Vitals reviewed. Constitutional: She is oriented to person, place, and time. She appears well-developed and well-nourished.  HENT:  Head: Normocephalic and atraumatic.  Mouth/Throat: Oropharynx is clear and moist.  Eyes: Conjunctivae and EOM are normal. Pupils are equal, round, and reactive to light. No scleral icterus.  Neck: Normal range of motion. Neck supple. No JVD present. No tracheal deviation present. No thyromegaly present.  Cardiovascular: Normal rate, regular rhythm and normal heart sounds.  Exam reveals no gallop and no friction rub.   No murmur heard. Respiratory: Effort normal and breath sounds normal.  GI: Soft. Bowel sounds are normal. She exhibits no distension. There is no tenderness.  Genitourinary:  Genitourinary Comments: Deferred  Musculoskeletal: Normal range of motion. She exhibits no edema.  Lymphadenopathy:    She has no cervical adenopathy.  Neurological: She is alert and oriented to person, place, and time. No cranial nerve deficit. She exhibits normal muscle tone.  Skin: Skin is warm and dry. No rash noted. No erythema.  Psychiatric: She has a normal mood and affect. Her behavior is normal. Judgment and thought content normal.     Assessment/Plan This is a 60-year-old female admitted for syncope and hip   fracture. 1. Syncope: The patient has SVT  listed as part of her medical history. She also reports that she has a recent history of melanotic stools. Both could contribute to her syncope however she is not anemic today. Hydrate with intravenous fluid. Echocardiogram ordered. Monitor telemetry. Continue nadolol 2. Hip fracture: Left intertrochanteric; manage pain. The patient is moderate risk for noncardiothoracic surgery. Nothing by mouth in anticipation of hip repair. 3. Melena: Intermittently over the last 6 weeks. (The patient was going to start an outpatient workup per her primary care doctor). PPI every 12 hours. Hemoccult Stool. Consult GI if necessary. 4. Hyperlipidemia: Continue statin therapy 5. Depression/anxiety: Continue Wellbutrin, BuSpar, Zoloft and Klonopin 6. DVT prophylaxis: SCDs 7. GI prophylaxis: As above The patient is a full code. Time spent on admission orders and patient care approximately 45 minutes  ,   S, MD 05/29/2016, 6:58 AM   

## 2016-05-29 NOTE — Progress Notes (Signed)
pts surgery will have to be delayed until Tuesday 05/30/16 per Dr. Page Spiro. Pt ok to eat and cont with pain control

## 2016-05-29 NOTE — Progress Notes (Signed)
[place] [facility]                                                                                                                                                                                  Patient Demographics   Wanda [name], is a 61 y.o. female, DOB - 02/23/1956, [id number]  Admit date - 05/29/2016   Admitting Physician [name], MD  Outpatient Primary MD for the patient is SOLES, MEREDITH KEY, MD   LOS - 0  Subjective: Patient admitted with syncope. She reports that she was feeling well yesterday does not have any chest pain or shortness of breath.    Review of Systems:   CONSTITUTIONAL: No documented fever. No fatigue, weakness. No weight gain, no weight loss.  EYES: No blurry or double vision.  ENT: No tinnitus. No postnasal drip. No redness of the oropharynx.  RESPIRATORY: No cough, no wheeze, no hemoptysis. No dyspnea.  CARDIOVASCULAR: No chest pain. No orthopnea. No palpitations. No syncope.  GASTROINTESTINAL: No nausea, no vomiting or diarrhea. No abdominal pain. No melena or hematochezia.  GENITOURINARY: No dysuria or hematuria.  ENDOCRINE: No polyuria or nocturia. No heat or cold intolerance.  HEMATOLOGY: No anemia. No bruising. No bleeding.  INTEGUMENTARY: No rashes. No lesions.  MUSCULOSKELETAL: Left hip pain NEUROLOGIC: No numbness, tingling, or ataxia. No seizure-type activity.  PSYCHIATRIC: No anxiety. No insomnia. No ADD.    Vitals:   Vitals:   05/29/16 0701 05/29/16 0744 05/29/16 1041 05/29/16 1349  BP:  (!) 124/59 109/67 (!) 76/41  Pulse:  78 91 70  Resp:    18  Temp:  98.2 F (36.8 C) 98.7 F (37.1 C) 98.5 F (36.9 C)  TempSrc:  Oral Oral Oral  SpO2:  99% 97% 96%  Weight: 146 lb 3.2 oz (66.3 kg)     Height:        Wt Readings from Last 3 Encounters:  05/29/16 146 lb 3.2 oz (66.3 kg)  04/05/16 142 lb (64.4 kg)  03/29/16 142 lb (64.4 kg)     Intake/Output Summary (Last 24 hours) at 05/29/16  1429 Last data filed at 05/29/16 0502  Gross per 24 hour  Intake                0 ml  Output              175 ml  Net             -175 ml    Physical Exam:   GENERAL: Pleasant-appearing in no apparent distress.  HEAD, EYES, EARS, NOSE AND THROAT: Atraumatic, normocephalic. Extraocular muscles are intact. Pupils equal and reactive to  light. Sclerae anicteric. No conjunctival injection. No oro-pharyngeal erythema.  NECK: Supple. There is no jugular venous distention. No bruits, no lymphadenopathy, no thyromegaly.  HEART: Regular rate and rhythm,. No murmurs, no rubs, no clicks.  LUNGS: Clear to auscultation bilaterally. No rales or rhonchi. No wheezes.  ABDOMEN: Soft, flat, nontender, nondistended. Has good bowel sounds. No hepatosplenomegaly appreciated.  EXTREMITIES: No evidence of any cyanosis, clubbing, or peripheral edema.  +2 pedal and radial pulses bilaterally.  NEUROLOGIC: The patient is alert, awake, and oriented x3 with no focal motor or sensory deficits appreciated bilaterally.  SKIN: Moist and warm with no rashes appreciated.  Psych: Not anxious, depressed LN: No inguinal LN enlargement    Antibiotics   Anti-infectives    Start     Dose/Rate Route Frequency Ordered Stop   05/29/16 1000  valACYclovir (VALTREX) tablet 1,000 mg     1,000 mg Oral Daily 05/29/16 0637        Medications   Scheduled Meds: . atorvastatin  40 mg Oral q1800  . docusate sodium  100 mg Oral BID  . nadolol  20 mg Oral Daily  . pantoprazole  40 mg Oral BID  . prazosin  2 mg Oral QHS  . sertraline  50 mg Oral QHS  . sodium chloride flush  3 mL Intravenous Q12H  . valACYclovir  1,000 mg Oral Daily   Continuous Infusions: . 0.9 % NaCl with KCl 40 mEq / L 125 mL/hr (05/29/16 1100)   PRN Meds:.acetaminophen **OR** acetaminophen, clonazePAM, morphine injection, ondansetron **OR** ondansetron (ZOFRAN) IV   Data Review:   Micro Results No results found for this or any previous visit (from the  past 240 hour(s)).  Radiology Reports Ct Head Wo Contrast  Result Date: 05/29/2016 CLINICAL DATA:  Fall with hip fracture, syncopal episode EXAM: CT HEAD WITHOUT CONTRAST TECHNIQUE: Contiguous axial images were obtained from the base of the skull through the vertex without intravenous contrast. COMPARISON:  None. FINDINGS: Brain: No evidence for acute territorial infarction, intracranial hemorrhage or focal mass lesion. No midline shift. Ventricles are nonenlarged. Minimal atrophy. Vascular: No hyperdense vessels.  No unexpected calcifications. Skull: Mastoid air cells are clear. No skull fracture is visualized. Sinuses/Orbits: Minimal fluid within the left sphenoid sinus. Mild mucosal thickening in the ethmoid sinuses. No acute orbital abnormality. Other: None IMPRESSION: No CT evidence for acute intracranial abnormality. Electronically Signed   By: [name] M.D.   On: 05/29/2016 03:41   Dg Chest 1 View  Result Date: 05/29/2016 CLINICAL DATA:  Preop, fall with hip fracture EXAM: CHEST 1 VIEW COMPARISON:  06/23/2010 FINDINGS: There is no focal pulmonary infiltrate, consolidation or effusion. Cardiomediastinal silhouette is nonenlarged. There is no pneumothorax. Stimulator leads are visualized over the spine. IMPRESSION: No radiographic evidence for acute cardiopulmonary abnormality Electronically Signed   By: [name] M.D.   On: 05/29/2016 03:37   Dg Hip Unilat W Or Wo Pelvis 2-3 Views Left  Result Date: 05/29/2016 CLINICAL DATA:  Fall with left hip pain EXAM: DG HIP (WITH OR WITHOUT PELVIS) 2-3V LEFT COMPARISON:  10/18/2015 FINDINGS: Left lower quadrant generator again noted. The right femoral head appears normally positioned and there are degenerative changes of the right hip as before. Pubic symphysis appears intact. Mildly comminuted intertrochanteric fracture of the proximal left femur are with mild varus angulation. Left femoral head projects in joint. IMPRESSION: Acute mildly angulated  left intertrochanteric fracture. Electronically Signed   By: [name] M.D.   On: 05/29/2016 03:35  CBC  Recent Labs Lab 05/29/16 0304  WBC 9.3  HGB 12.9  HCT 36.1  PLT 226  MCV 101.3*  MCH 36.2*  MCHC 35.8  RDW 13.0    Chemistries   Recent Labs Lab 05/29/16 0304  NA 140  K 3.3*  CL 108  CO2 24  GLUCOSE 158*  BUN 6  CREATININE 0.62  CALCIUM 8.6*   ------------------------------------------------------------------------------------------------------------------ estimated creatinine clearance is 65.2 mL/min (by C-G formula based on SCr of 0.62 mg/dL). ------------------------------------------------------------------------------------------------------------------ No results for input(s): HGBA1C in the last 72 hours. ------------------------------------------------------------------------------------------------------------------ No results for input(s): CHOL, HDL, LDLCALC, TRIG, CHOLHDL, LDLDIRECT in the last 72 hours. ------------------------------------------------------------------------------------------------------------------  Recent Labs  05/29/16 0304  TSH 5.193*   ------------------------------------------------------------------------------------------------------------------ No results for input(s): VITAMINB12, FOLATE, FERRITIN, TIBC, IRON, RETICCTPCT in the last 72 hours.  Coagulation profile No results for input(s): INR, PROTIME in the last 168 hours.  No results for input(s): DDIMER in the last 72 hours.  Cardiac Enzymes  Recent Labs Lab 05/29/16 0304  TROPONINI <0.03   ------------------------------------------------------------------------------------------------------------------ Invalid input(s): POCBNP    Assessment & Plan   Patient is a 61 year old admitted with a syncopal episode with a hip fracture  1. Preop clearance: Patient at intermediate risk of surgery. No contraindication to surgery. Her echocardiogram reviewed.  No further workup needed okay to proceed to or  2.  Syncope unclear etiology: We will monitor on telemetry, order carotid Dopplers- does not have to have these prior to surgery  3.  Hyperlipidemia unspecified continue atorvastatin as taking at home  4. Essential hypertension continue now to [place]  5. GERD we'll continue Protonix  6. Miscellaneous Lovenox postop      Code Status Orders        Start     Ordered   05/29/16 [id number]  Full code  Continuous     05/29/16 0637    Code Status History    Date Active Date Inactive Code Status Order ID Comments User Context   This patient has a current code status but no historical code status.           Consults Orthopedics   DVT Prophylaxis  Lovenox   Lab Results  Component Value Date   PLT 226 05/29/2016     Time Spent in minutes 9min Greater than 50% of time spent in care coordination and counseling patient regarding the condition and plan of care.   Dustin Flock M.D on 05/29/2016 at 2:29 PM  Between 7am to 6pm - Pager - [phone]  After 6pm go to [email or web address] - password [email or web address] [facility]   Office  [phone]

## 2016-05-29 NOTE — NC FL2 (Signed)
[place] LEVEL OF CARE SCREENING TOOL     IDENTIFICATION  Patient Name: Wanda [name] Birthdate: February 23, 1956 Sex: female Admission Date (Current Location): 05/29/2016  [place] and [place] Number:  Engineering geologist and Address:  [facility], [address], [place], [place] [address]      Provider Number: [id number]  Attending Physician Name and Address:  Dustin Flock, MD  Relative Name and Phone Number:       Current Level of Care: Hospital Recommended Level of Care: [place] Prior Approval Number:    Date Approved/Denied:   PASRR Number:  [id number])  Discharge Plan: SNF    Current Diagnoses: Patient Active Problem List   Diagnosis Date Noted  . Hip fracture ([place]) 05/29/2016  . Chronic pain 02/02/2016  . Compression fracture of L1 lumbar vertebra [facility]) (old) 11/15/2015  . Chronic prescription benzodiazepine use 10/18/2015  . Chronic low back pain (Location of Primary Source of Pain) (Bilateral) (R>L) 10/18/2015  . 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 Lumbar spondylosis 10/18/2015  . Chronic pain of left lower extremity 10/18/2015  . Lumbar facet arthropathy 10/18/2015  . Lumbar facet syndrome (Location of Primary Source of Pain) (Bilateral) (R>L) 10/18/2015  . Chronic lower extremity pain (Location of Secondary source of pain) (Right) 10/18/2015  . Chronic hip pain (Location of Secondary source of pain) (Right) 10/18/2015  . Osteoarthritis of hip (Right) 10/18/2015  . Chronic constipation 12/03/2009  . Osteopenia 08/17/2009  . Hyperlipidemia 06/29/2009  . Hypokalemia 12/11/2008  . Anxiety and depression 12/11/2008  . Common migraine 12/11/2008  . SVT (supraventricular tachycardia) ([place]) 12/11/2008  . Allergic rhinitis 12/11/2008  . Gastroesophageal reflux disease 12/11/2008  . Generalized osteoarthrosis of hand 12/11/2008  . Osteoporosis 12/11/2008  . Mixed  urinary incontinence 12/11/2008  . History of nephrolithiasis 12/11/2008    Orientation RESPIRATION BLADDER Height & Weight     Self, Time, Situation, Place  Normal Continent Weight: 146 lb 3.2 oz (66.3 kg) Height:  5\' 1"  (154.9 cm)  BEHAVIORAL SYMPTOMS/MOOD NEUROLOGICAL BOWEL NUTRITION STATUS   (none)  (none) Continent Diet (Diet: NPO for surgery. )  AMBULATORY STATUS COMMUNICATION OF NEEDS Skin   Extensive Assist Verbally Surgical wounds                       Personal Care Assistance Level of Assistance  Bathing, Feeding, Dressing Bathing Assistance: Limited assistance Feeding assistance: Independent Dressing Assistance: Limited assistance     Functional Limitations Info  Sight, Hearing, Speech Sight Info: Adequate Hearing Info: Adequate Speech Info: Adequate    SPECIAL CARE FACTORS FREQUENCY  PT (By licensed PT), OT (By licensed OT)     PT Frequency:  (5) OT Frequency:  (5)            Contractures      Additional Factors Info  Code Status, Allergies Code Status Info:  (Full Code. ) Allergies Info:  (Codeine, Levofloxacin, Propoxyphene Hcl)           Current Medications (05/29/2016):  This is the current hospital active medication list Current Facility-Administered Medications  Medication Dose Route Frequency Provider Last Rate Last Dose  . 0.9 % NaCl with KCl 40 mEq / L  infusion   Intravenous Continuous [name], MD 125 mL/hr at 05/29/16 1100 125 mL/hr at 05/29/16 1100  . acetaminophen (TYLENOL) tablet 650 mg  650 mg Oral Q6H PRN [name], MD  650 mg at 05/29/16 [id number]   Or  . acetaminophen (TYLENOL) suppository 650 mg  650 mg Rectal Q6H PRN [name], MD      . atorvastatin (LIPITOR) tablet 40 mg  40 mg Oral q1800 [name], MD      . clonazePAM [facility]) tablet 0.5 mg  0.5 mg Oral TID PRN [name], MD   0.5 mg at 05/29/16 [id number]  . docusate sodium (COLACE) capsule 100 mg  100 mg Oral BID [name], MD    100 mg at 05/29/16 1059  . morphine 4 MG/ML injection 4 mg  4 mg Intravenous Q3H PRN [name], MD   4 mg at 05/29/16 1100  . nadolol (CORGARD) tablet 20 mg  20 mg Oral Daily [name], MD   20 mg at 05/29/16 1100  . ondansetron (ZOFRAN) tablet 4 mg  4 mg Oral Q6H PRN [name], MD       Or  . ondansetron [facility]) injection 4 mg  4 mg Intravenous Q6H PRN [name], MD      . pantoprazole (PROTONIX) EC tablet 40 mg  40 mg Oral BID [name], MD   40 mg at 05/29/16 1059  . prazosin (MINIPRESS) capsule 2 mg  2 mg Oral QHS [name], MD      . sertraline (ZOLOFT) tablet 50 mg  50 mg Oral QHS [name], MD      . sodium chloride flush (NS) 0.9 % injection 3 mL  3 mL Intravenous Q12H [name], MD   3 mL at 05/29/16 1000  . valACYclovir (VALTREX) tablet 1,000 mg  1,000 mg Oral Daily [name], MD   1,000 mg at 05/29/16 1059     Discharge Medications: Please see discharge summary for a list of discharge medications.  Relevant Imaging Results:  Relevant Lab Results:   Additional Information  (SSN: [id number])  Eivan Gallina, Veronia Beets, LCSW

## 2016-05-29 NOTE — Clinical Social Work Note (Signed)
Clinical Social Work Assessment  Patient Details  Name: Wanda [name] MRN: [id number] Date of Birth: 04-29-1956  Date of referral:  05/29/16               Reason for consult:  Facility Placement                Permission sought to share information with:  Chartered certified accountant granted to share information::  Yes, Verbal Permission Granted  Name::      [place]::   [place]   Relationship::     Contact Information:     Housing/Transportation Living arrangements for the past 2 months:  [place] of Information:  Patient Patient Interpreter Needed:  None Criminal Activity/Legal Involvement Pertinent to Current Situation/Hospitalization:  No - Comment as needed Significant Relationships:  Adult Children Lives with:  Self Do you feel safe going back to the place where you live?  Yes Need for family participation in patient care:  Yes (Comment)  Care giving concerns:  Patient lives alone at [facility] senior apartment complex in [place], [place].    Social Worker assessment / plan:  Holiday representative (CSW) reviewed chart and noted that patient will have surgery today for a hip fracture. Patient was alert and oriented and was laying in the bed. CSW introduced self and explained role of CSW department. Patient reported that she lives alone in [place] and her son Saralyn Pilar is her primary support and lives near by. CSW explained SNF process and that PT will recommend home health or SNF after surgery. Patient is agreeable to SNF search in [facility] however she prefers to go home. FL2 complete and faxed out. CSW will continue to follow and assist as needed.   Employment status:  Retired Nurse, adult PT Recommendations:  Not assessed at this time Information / Referral to community resources:  [place]  Patient/Family's Response to care:  Patient prefers to go home after  surgery.   Patient/Family's Understanding of and Emotional Response to Diagnosis, Current Treatment, and Prognosis:  Patient was pleasant and thanked CSW for visit.   Emotional Assessment Appearance:  Appears stated age Attitude/Demeanor/Rapport:    Affect (typically observed):  Accepting, Adaptable, Pleasant Orientation:  Oriented to Self, Oriented to Place, Oriented to  Time, Oriented to Situation Alcohol / Substance use:  Not Applicable Psych involvement (Current and /or in the community):  No (Comment)  Discharge Needs  Concerns to be addressed:  Discharge Planning Concerns Readmission within the last 30 days:  No Current discharge risk:  Dependent with Mobility Barriers to Discharge:  Continued Medical Work up   [employer], Veronia Beets, LCSW 05/29/2016, 2:12 PM

## 2016-05-29 NOTE — Progress Notes (Signed)
*  PRELIMINARY RESULTS* Echocardiogram 2D Echocardiogram has been performed.  Sherrie Sport 05/29/2016, 9:46 AM

## 2016-05-29 NOTE — ED Triage Notes (Signed)
Pt states got up to go to the bathroom from bed when "everything went black and I woke up in the floor". Pt complains of left hip pain. Cms intact to left toes. Ems gave 42mcg of fentanyl. Skin warm and dry, resps unlabored.

## 2016-05-29 NOTE — ED Notes (Signed)
Pt to xray

## 2016-05-30 ENCOUNTER — Inpatient Hospital Stay: Payer: Medicare PPO

## 2016-05-30 ENCOUNTER — Encounter
Admission: EM | Disposition: A | Discharge status: Skilled Nursing Facility | Payer: Self-pay | Source: Home / Self Care | Attending: Internal Medicine

## 2016-05-30 ENCOUNTER — Inpatient Hospital Stay: Payer: Medicare PPO | Admitting: Anesthesiology

## 2016-05-30 ENCOUNTER — Encounter: Payer: Self-pay | Admitting: *Deleted

## 2016-05-30 ENCOUNTER — Telehealth: Payer: Self-pay | Admitting: Pain Medicine

## 2016-05-30 HISTORY — PX: INTRAMEDULLARY (IM) NAIL INTERTROCHANTERIC: SHX5875

## 2016-05-30 LAB — SURGICAL PCR SCREEN
MRSA, PCR: NEGATIVE
Staphylococcus aureus: POSITIVE — AB

## 2016-05-30 LAB — HEMOGLOBIN A1C
Hgb A1c MFr Bld: 5.4 % (ref 4.8–5.6)
Mean Plasma Glucose: 108 mg/dL

## 2016-05-30 SURGERY — FIXATION, FRACTURE, INTERTROCHANTERIC, WITH INTRAMEDULLARY ROD
Anesthesia: General | Site: Hip | Laterality: Left | Wound class: Clean

## 2016-05-30 MED ORDER — ALUM & MAG HYDROXIDE-SIMETH 200-200-20 MG/5ML PO SUSP: 30.0000 mL | ORAL | Status: DC | PRN

## 2016-05-30 MED ORDER — DEXAMETHASONE SODIUM PHOSPHATE 10 MG/ML IJ SOLN
INTRAMUSCULAR | Status: AC
  Filled 2016-05-30: qty 1

## 2016-05-30 MED ORDER — CEFAZOLIN SODIUM-DEXTROSE 2-3 GM-% IV SOLR
INTRAVENOUS | Status: DC | PRN
  Administered 2016-05-30: 2 g via INTRAVENOUS

## 2016-05-30 MED ORDER — SUCCINYLCHOLINE CHLORIDE 20 MG/ML IJ SOLN
INTRAMUSCULAR | Status: DC | PRN
  Administered 2016-05-30: 100 mg via INTRAVENOUS

## 2016-05-30 MED ORDER — BISACODYL 10 MG RE SUPP: 10.0000 mg | Freq: Every day | RECTAL | Status: DC | PRN

## 2016-05-30 MED ORDER — CEFAZOLIN SODIUM-DEXTROSE 2-4 GM/100ML-% IV SOLN
2.0000 g | Freq: Four times a day (QID) | INTRAVENOUS | Status: AC
  Administered 2016-05-30 (×2): 2 g via INTRAVENOUS
  Filled 2016-05-30 (×2): qty 100

## 2016-05-30 MED ORDER — NEOMYCIN-POLYMYXIN B GU 40-200000 IR SOLN
Status: AC
  Filled 2016-05-30: qty 4

## 2016-05-30 MED ORDER — PROPOFOL 10 MG/ML IV BOLUS
INTRAVENOUS | Status: AC
  Filled 2016-05-30: qty 20

## 2016-05-30 MED ORDER — SENNA 8.6 MG PO TABS
1.0000 | ORAL_TABLET | Freq: Two times a day (BID) | ORAL | Status: DC
  Administered 2016-05-30 – 2016-06-04 (×11): 8.6 mg via ORAL
  Filled 2016-05-30 (×11): qty 1

## 2016-05-30 MED ORDER — MAGNESIUM CITRATE PO SOLN
1.0000 | Freq: Once | ORAL | Status: DC | PRN
  Filled 2016-05-30: qty 296

## 2016-05-30 MED ORDER — METHOCARBAMOL 500 MG PO TABS
500.0000 mg | ORAL_TABLET | Freq: Four times a day (QID) | ORAL | Status: DC | PRN
  Administered 2016-05-30 – 2016-06-04 (×3): 500 mg via ORAL
  Filled 2016-05-30 (×4): qty 1

## 2016-05-30 MED ORDER — SUGAMMADEX SODIUM 200 MG/2ML IV SOLN
INTRAVENOUS | Status: DC | PRN
  Administered 2016-05-30: 132.6 mg via INTRAVENOUS

## 2016-05-30 MED ORDER — PROMETHAZINE HCL 25 MG/ML IJ SOLN: 6.2500 mg | INTRAMUSCULAR | Status: DC | PRN

## 2016-05-30 MED ORDER — OXYCODONE HCL 5 MG PO TABS
5.0000 mg | ORAL_TABLET | ORAL | Status: DC | PRN
  Administered 2016-05-30: 5 mg via ORAL
  Administered 2016-05-30: 10 mg via ORAL
  Administered 2016-05-31: 5 mg via ORAL
  Administered 2016-05-31 – 2016-06-02 (×4): 10 mg via ORAL
  Administered 2016-06-03 (×2): 5 mg via ORAL
  Administered 2016-06-03: 10 mg via ORAL
  Administered 2016-06-04: 5 mg via ORAL
  Filled 2016-05-30 (×2): qty 2
  Filled 2016-05-30 (×2): qty 1
  Filled 2016-05-30 (×2): qty 2
  Filled 2016-05-30 (×2): qty 1
  Filled 2016-05-30 (×2): qty 2
  Filled 2016-05-30 (×2): qty 1
  Filled 2016-05-30: qty 2
  Filled 2016-05-30: qty 1

## 2016-05-30 MED ORDER — DOCUSATE SODIUM 100 MG PO CAPS
100.0000 mg | ORAL_CAPSULE | Freq: Two times a day (BID) | ORAL | Status: DC
  Administered 2016-05-30 – 2016-06-04 (×11): 100 mg via ORAL
  Filled 2016-05-30 (×11): qty 1

## 2016-05-30 MED ORDER — GARLIC 1000 MG PO CAPS: 1.0000 | ORAL_CAPSULE | Freq: Every day | ORAL | Status: DC

## 2016-05-30 MED ORDER — FENTANYL CITRATE (PF) 100 MCG/2ML IJ SOLN
INTRAMUSCULAR | Status: DC | PRN
  Administered 2016-05-30 (×2): 100 ug via INTRAVENOUS

## 2016-05-30 MED ORDER — POLYETHYLENE GLYCOL 3350 17 G PO PACK
17.0000 g | PACK | Freq: Every day | ORAL | Status: DC | PRN
  Administered 2016-05-31: 17 g via ORAL
  Filled 2016-05-30 (×2): qty 1

## 2016-05-30 MED ORDER — ACETAMINOPHEN 650 MG RE SUPP
650.0000 mg | Freq: Four times a day (QID) | RECTAL | Status: DC | PRN
Start: 2016-05-30 — End: 2016-06-04

## 2016-05-30 MED ORDER — EPHEDRINE SULFATE 50 MG/ML IJ SOLN
INTRAMUSCULAR | Status: DC | PRN
  Administered 2016-05-30 (×2): 5 mg via INTRAVENOUS

## 2016-05-30 MED ORDER — MIDAZOLAM HCL 2 MG/2ML IJ SOLN
INTRAMUSCULAR | Status: AC
  Filled 2016-05-30: qty 2

## 2016-05-30 MED ORDER — LACTATED RINGERS IV SOLN
INTRAVENOUS | Status: DC | PRN
Start: 2016-05-30 — End: 2016-05-30
  Administered 2016-05-30: 10:00:00 via INTRAVENOUS

## 2016-05-30 MED ORDER — QUETIAPINE FUMARATE 25 MG PO TABS
25.0000 mg | ORAL_TABLET | Freq: Every day | ORAL | Status: DC
  Administered 2016-05-30 – 2016-06-03 (×5): 25 mg via ORAL
  Filled 2016-05-30 (×5): qty 1

## 2016-05-30 MED ORDER — DEXAMETHASONE SODIUM PHOSPHATE 10 MG/ML IJ SOLN
INTRAMUSCULAR | Status: DC | PRN
  Administered 2016-05-30: 10 mg via INTRAVENOUS

## 2016-05-30 MED ORDER — QUETIAPINE FUMARATE 25 MG PO TABS: 25.0000 mg | ORAL_TABLET | Freq: Every day | ORAL | Status: DC

## 2016-05-30 MED ORDER — ONDANSETRON HCL 4 MG PO TABS: 4.0000 mg | ORAL_TABLET | Freq: Four times a day (QID) | ORAL | Status: DC | PRN

## 2016-05-30 MED ORDER — FENTANYL CITRATE (PF) 100 MCG/2ML IJ SOLN
INTRAMUSCULAR | Status: AC
  Administered 2016-05-30: 50 ug via INTRAVENOUS
  Filled 2016-05-30: qty 2

## 2016-05-30 MED ORDER — ENOXAPARIN SODIUM 40 MG/0.4ML ~~LOC~~ SOLN
40.0000 mg | SUBCUTANEOUS | Status: DC
  Administered 2016-05-31 – 2016-06-04 (×5): 40 mg via SUBCUTANEOUS
  Filled 2016-05-30 (×5): qty 0.4

## 2016-05-30 MED ORDER — NEOMYCIN-POLYMYXIN B GU 40-200000 IR SOLN
Status: DC | PRN
  Administered 2016-05-30: 500 mL via SURGICAL_CAVITY

## 2016-05-30 MED ORDER — METHOCARBAMOL 1000 MG/10ML IJ SOLN
500.0000 mg | Freq: Four times a day (QID) | INTRAMUSCULAR | Status: DC | PRN
  Filled 2016-05-30: qty 5

## 2016-05-30 MED ORDER — SUCCINYLCHOLINE CHLORIDE 200 MG/10ML IV SOSY
PREFILLED_SYRINGE | INTRAVENOUS | Status: AC
  Filled 2016-05-30: qty 10

## 2016-05-30 MED ORDER — FENTANYL CITRATE (PF) 250 MCG/5ML IJ SOLN
INTRAMUSCULAR | Status: AC
  Filled 2016-05-30: qty 5

## 2016-05-30 MED ORDER — ROCURONIUM BROMIDE 50 MG/5ML IV SOSY
PREFILLED_SYRINGE | INTRAVENOUS | Status: AC
  Filled 2016-05-30: qty 5

## 2016-05-30 MED ORDER — FERROUS SULFATE 325 (65 FE) MG PO TABS
325.0000 mg | ORAL_TABLET | Freq: Three times a day (TID) | ORAL | Status: DC
  Administered 2016-05-30 – 2016-06-04 (×14): 325 mg via ORAL
  Filled 2016-05-30 (×13): qty 1

## 2016-05-30 MED ORDER — MENTHOL 3 MG MT LOZG
1.0000 | LOZENGE | OROMUCOSAL | Status: DC | PRN
  Filled 2016-05-30: qty 9

## 2016-05-30 MED ORDER — PHENOL 1.4 % MT LIQD
1.0000 | OROMUCOSAL | Status: DC | PRN
  Filled 2016-05-30: qty 177

## 2016-05-30 MED ORDER — FENTANYL CITRATE (PF) 100 MCG/2ML IJ SOLN
25.0000 ug | INTRAMUSCULAR | Status: DC | PRN
  Administered 2016-05-30 (×2): 50 ug via INTRAVENOUS

## 2016-05-30 MED ORDER — HYDROMORPHONE HCL 1 MG/ML IJ SOLN
0.1000 mg | INTRAMUSCULAR | Status: DC | PRN
  Administered 2016-05-30: 1 mg via INTRAVENOUS
  Filled 2016-05-30: qty 1

## 2016-05-30 MED ORDER — LIDOCAINE 2% (20 MG/ML) 5 ML SYRINGE
INTRAMUSCULAR | Status: AC
  Filled 2016-05-30: qty 5

## 2016-05-30 MED ORDER — CEFAZOLIN SODIUM-DEXTROSE 2-4 GM/100ML-% IV SOLN: 2.0000 g | INTRAVENOUS | Status: DC

## 2016-05-30 MED ORDER — ONDANSETRON HCL 4 MG/2ML IJ SOLN: 4.0000 mg | Freq: Four times a day (QID) | INTRAMUSCULAR | Status: DC | PRN

## 2016-05-30 MED ORDER — LIDOCAINE 2% (20 MG/ML) 5 ML SYRINGE
INTRAMUSCULAR | Status: DC | PRN
  Administered 2016-05-30: 100 mg via INTRAVENOUS

## 2016-05-30 MED ORDER — ROCURONIUM BROMIDE 100 MG/10ML IV SOLN
INTRAVENOUS | Status: DC | PRN
  Administered 2016-05-30: 35 mg via INTRAVENOUS
  Administered 2016-05-30: 5 mg via INTRAVENOUS

## 2016-05-30 MED ORDER — PROPOFOL 10 MG/ML IV BOLUS
INTRAVENOUS | Status: DC | PRN
  Administered 2016-05-30: 100 mg via INTRAVENOUS

## 2016-05-30 MED ORDER — SUGAMMADEX SODIUM 200 MG/2ML IV SOLN
INTRAVENOUS | Status: AC
  Filled 2016-05-30: qty 2

## 2016-05-30 MED ORDER — MIDAZOLAM HCL 2 MG/2ML IJ SOLN
INTRAMUSCULAR | Status: DC | PRN
  Administered 2016-05-30 (×2): 1 mg via INTRAVENOUS

## 2016-05-30 MED ORDER — ADULT MULTIVITAMIN W/MINERALS CH
1.0000 | ORAL_TABLET | Freq: Every day | ORAL | Status: DC
  Administered 2016-05-31 – 2016-06-04 (×5): 1 via ORAL
  Filled 2016-05-30 (×5): qty 1

## 2016-05-30 MED ORDER — SODIUM CHLORIDE 0.9 % IV SOLN
75.0000 mL/h | INTRAVENOUS | Status: DC
  Administered 2016-05-30 – 2016-05-31 (×2): 75 mL/h via INTRAVENOUS

## 2016-05-30 MED ORDER — EPHEDRINE 5 MG/ML INJ
INTRAVENOUS | Status: AC
  Filled 2016-05-30: qty 10

## 2016-05-30 MED ORDER — ACETAMINOPHEN 325 MG PO TABS
650.0000 mg | ORAL_TABLET | Freq: Four times a day (QID) | ORAL | Status: DC | PRN
  Administered 2016-06-03 – 2016-06-04 (×3): 650 mg via ORAL
  Filled 2016-05-30 (×3): qty 2

## 2016-05-30 MED ORDER — ONDANSETRON HCL 4 MG/2ML IJ SOLN
INTRAMUSCULAR | Status: AC
  Filled 2016-05-30: qty 2

## 2016-05-30 MED ORDER — PHENYLEPHRINE HCL 10 MG/ML IJ SOLN
INTRAMUSCULAR | Status: DC | PRN
  Administered 2016-05-30 (×4): 100 ug via INTRAVENOUS

## 2016-05-30 MED ORDER — LACTATED RINGERS IV SOLN
INTRAVENOUS | Status: DC
Start: 2016-05-30 — End: 2016-05-30
  Administered 2016-05-30 (×2): via INTRAVENOUS

## 2016-05-30 SURGICAL SUPPLY — 38 items
BIT DRILL 4.3MMS DISTAL GRDTED (BIT) IMPLANT
BNDG COHESIVE 6X5 TAN STRL LF (GAUZE/BANDAGES/DRESSINGS) ×4 IMPLANT
CANISTER SUCT 1200ML W/VALVE (MISCELLANEOUS) ×2 IMPLANT
DRAPE SHEET LG 3/4 BI-LAMINATE (DRAPES) ×4 IMPLANT
DRAPE SURG 17X11 SM STRL (DRAPES) ×4 IMPLANT
DRAPE U-SHAPE 47X51 STRL (DRAPES) ×2 IMPLANT
DRILL 4.3MMS DISTAL GRADUATED (BIT) ×2
DRSG OPSITE POSTOP 4X14 (GAUZE/BANDAGES/DRESSINGS) ×2 IMPLANT
DRSG OPSITE POSTOP 4X8 (GAUZE/BANDAGES/DRESSINGS) ×1 IMPLANT
DURAPREP 26ML APPLICATOR (WOUND CARE) ×3 IMPLANT
ELECT REM PT RETURN 9FT ADLT (ELECTROSURGICAL) ×2
ELECTRODE REM PT RTRN 9FT ADLT (ELECTROSURGICAL) ×1 IMPLANT
GLOVE BIOGEL PI IND STRL 9 (GLOVE) ×1 IMPLANT
GLOVE BIOGEL PI INDICATOR 9 (GLOVE) ×1
GLOVE SURG 9.0 ORTHO LTXF (GLOVE) ×4 IMPLANT
GOWN STRL REUS TWL 2XL XL LVL4 (GOWN DISPOSABLE) ×2 IMPLANT
GOWN STRL REUS W/ TWL LRG LVL3 (GOWN DISPOSABLE) ×1 IMPLANT
GOWN STRL REUS W/TWL LRG LVL3 (GOWN DISPOSABLE) ×2
GUIDEPIN VERSANAIL DSP 3.2X444 ×1 IMPLANT
GUIDEWIRE BALL NOSE 80CM (WIRE) ×1 IMPLANT
HEMOVAC 400CC 10FR (MISCELLANEOUS) ×1 IMPLANT
HIP FRA NAIL LAG SCREW 10.5X90 (Orthopedic Implant) ×2 IMPLANT
KIT RM TURNOVER CYSTO AR (KITS) ×2 IMPLANT
MAT BLUE FLOOR 46X72 FLO (MISCELLANEOUS) ×1 IMPLANT
NAIL HIP FRA AFFIX 130X9X340 L (Nail) ×1 IMPLANT
NS IRRIG 1000ML POUR BTL (IV SOLUTION) ×2 IMPLANT
PACK HIP COMPR (MISCELLANEOUS) ×2 IMPLANT
SCREW BONE CORTICAL 5.0X3 (Screw) ×1 IMPLANT
SCREW BONE CORTICAL 5.0X32 (Screw) ×1 IMPLANT
SCREW LAG HIP FRA NAIL 10.5X90 (Orthopedic Implant) IMPLANT
STAPLER SKIN PROX 35W (STAPLE) ×2 IMPLANT
SUCTION FRAZIER HANDLE 10FR (MISCELLANEOUS) ×1
SUCTION TUBE FRAZIER 10FR DISP (MISCELLANEOUS) ×1 IMPLANT
SUT VIC AB 0 CT1 36 (SUTURE) ×4 IMPLANT
SUT VIC AB 2-0 CT1 27 (SUTURE) ×2
SUT VIC AB 2-0 CT1 TAPERPNT 27 (SUTURE) ×1 IMPLANT
SUT VICRYL 0 AB UR-6 (SUTURE) ×2 IMPLANT
SYR 30ML LL (SYRINGE) ×2 IMPLANT

## 2016-05-30 NOTE — Anesthesia Preprocedure Evaluation (Signed)
Anesthesia Evaluation  Patient identified by MRN, date of birth, ID band Patient awake    Reviewed: Allergy & Precautions, H&P , NPO status , Patient's Chart, lab work & pertinent test results, reviewed documented beta blocker date and time   History of Anesthesia Complications (+) PROLONGED EMERGENCE and history of anesthetic complications  Airway Mallampati: II  TM Distance: <3 FB Neck ROM: full    Dental  (+) Edentulous Upper, Partial Lower, Poor Dentition, Dental Advidsory Given   Pulmonary neg pulmonary ROS, former smoker,    Pulmonary exam normal breath sounds clear to auscultation       Cardiovascular Exercise Tolerance: Good (-) angina(-) CAD, (-) Past MI, (-) Cardiac Stents and (-) CABG Normal cardiovascular exam+ dysrhythmias + Valvular Problems/Murmurs  Rhythm:regular Rate:Normal     Neuro/Psych PSYCHIATRIC DISORDERS (Depression and anxiety) negative neurological ROS     GI/Hepatic Neg liver ROS, GERD  ,  Endo/Other  diabetes (borderline)  Renal/GU negative Renal ROS  negative genitourinary   Musculoskeletal   Abdominal   Peds  Hematology negative hematology ROS (+)   Anesthesia Other Findings Past Medical History: No date: Anxiety 06/25/2010: BRONCHITIS, ACUTE WITH BRONCHOSPASM     Comment: Qualifier: Diagnosis of  By: Alveta Heimlich MD, Cornelia Copa   No date: Depression 05/14/2009: GASTROENTERITIS WITHOUT DEHYDRATION     Comment: Qualifier: Diagnosis of  By: Deborra Medina MD, [place]   No date: GERD (gastroesophageal reflux disease) No date: 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/2016: Hip fx, left, closed, with nonunion, subsequen* No date: Hyperlipidemia No date: Irregular heart beat   Reproductive/Obstetrics negative OB ROS                             Anesthesia Physical Anesthesia Plan  ASA: II  Anesthesia Plan: General   Post-op Pain Management:    Induction:   Airway Management Planned:   Additional  Equipment:   Intra-op Plan:   Post-operative Plan:   Informed Consent: I have reviewed the patients History and Physical, chart, labs and discussed the procedure including the risks, benefits and alternatives for the proposed anesthesia with the patient or authorized representative who has indicated his/her understanding and acceptance.   Dental Advisory Given  Plan Discussed with: Anesthesiologist, CRNA and Surgeon  Anesthesia Plan Comments:         Anesthesia Quick Evaluation

## 2016-05-30 NOTE — Op Note (Signed)
DATE OF SURGERY:  05/30/2016  TIME: 11:39 AM  PATIENT NAME:  Wanda [name]  AGE: 61 y.o.  PRE-OPERATIVE DIAGNOSIS:  left hip intertrochanteric hip fracture  POST-OPERATIVE DIAGNOSIS:  SAME  PROCEDURE:  INTRAMEDULLARY (IM) NAIL LEFT INTERTROCHANTRIC HIP FRACTURE  SURGEON:  Thornton Park  OPERATIVE IMPLANTS: Biomet Affixus nail 9 x 340, 90 mm lag screw with a 32 and 34 mm distal interlocking screws  PREOPERATIVE INDICATIONS:  Wanda [name] is a 61 y.o. year old who fell and suffered a hip fracture. She was brought into the ER and then admitted and medically cleared for surgical intervention.    The risks, benefits and alternatives were discussed with the patient and their family.  The risks include but are not limited to infection, bleeding, nerve or blood vessel injury, malunion, nonunion, hardware prominence, hardware failure, change in leg lengths or lower extremity rotation need for further surgery including hardware removal with conversion to a total hip arthroplasty. Medical risks include but are not limited to DVT and pulmonary embolism, myocardial infarction, stroke, pneumonia, respiratory failure and death. The patient and their family understood these risks and wished to proceed with surgery.  OPERATIVE PROCEDURE:  The patient was brought to the operating room and placed in the supine position on the fracture table. General anesthesia was administered.  A closed reduction was performed under C-arm guidance.  The fracture reduction was confirmed on both AP and lateral views. A time out was performed to verify the patient's name, date of birth, medical record number, correct site of surgery correct procedure to be performed. The timeout was also used to verify the patient received antibiotics and all appropriate instruments, implants and radiographic studies were available in the room. Once all in attendance were in agreement, the case began. The patient was prepped and draped in a  sterile fashion. She received preoperative antibiotics with 2 grams of ancef.  An incision was made proximal to the greater trochanter in line with the femur. A guidewire was placed over the tip of the greater trochanter and advanced into the proximal femur to the level of the lesser trochanter.  Confirmation of the drill pin position was made on AP and lateral C-arm images.  The threaded guidepin was then overdrilled with the proximal femoral drill.  A ball-tipped guidewire was then advanced across the fracture and down the femoral shaft to the knee. Its position at both the hip and the knee was confirmed on C-arm imaging. A depth gauge was used to measure the length of the long nail to be used. It was measured to be 340 mm. The nail was then inserted into the proximal femur, across the fracture site and down the femoral shaft. Its position was confirmed on AP and lateral C-arm images.   Once the nail was completely seated, the drill guide for the lag screw was placed through the guide arm for the Affixus nail. A guidepin was then placed through this drill guide and advanced through the lateral cortex of the femur, across the fracture site and into the femoral head achieving a tip apex distance of less than 25 mm. The length of the drill pin was measured, and then the drill for the lag screw was advanced through the lateral cortex, across the fracture site and up into the femoral head to the depth of the lag screw..  The lag screw was then advanced by hand into position across the fracture site into the femoral head. Its final position was confirmed  on AP and lateral C-arm images. Compression was applied as traction was carefully released. The set screw in the top of the intramedullary rod was tightened by hand using a screwdriver. It was backed off a quarter turn to allow for compression at the fracture site.  The attention was then turned to placement of the distal interlocking screws. A perfect circle  technique was used. 2 small stab incisions were made over the distal interlocking screw holes. A free hand technique was used to drill both distal interlocking screws. The depth of the screws was measured with a depth gauge. The appropriate length screw was then advanced into position and tightened by hand. Final C-arm images of the entire intramedullary construct were taken in both the AP and lateral planes.   The wounds were irrigated copiously and closed with 0 Vicryl for closure of the deep fascia and 2-0 Vicryl for subcutaneous closure. The skin was approximated with staples. A dry sterile dressing was applied. I was scrubbed and present the entire case and all sharp and instrument counts were correct at the conclusion of the case. Patient was transferred to hospital bed and brought to PACU in stable condition. I left a message for the patient's family by phone to let them know the case was performed without complication and the patient was stable in the recovery room.     Timoteo Gaul, MD

## 2016-05-30 NOTE — Transfer of Care (Signed)
Immediate Anesthesia Transfer of Care Note  Patient: Wanda [name]  Procedure(s) Performed: Procedure(s): INTRAMEDULLARY (IM) NAIL INTERTROCHANTRIC (Left)  Patient Location: PACU  Anesthesia Type:General  Level of Consciousness: awake, alert  and oriented  Airway & Oxygen Therapy: Patient Spontanous Breathing and Patient connected to face mask oxygen  Post-op Assessment: Report given to RN and Post -op Vital signs reviewed and stable  Post vital signs: Reviewed and stable  Last Vitals:  Vitals:   05/30/16 0822 05/30/16 0849  BP: 92/60 101/65  Pulse: 96 87  Resp: 17 16  Temp: 37.1 C 37.7 C    Last Pain:  Vitals:   05/30/16 0849  TempSrc: Tympanic  PainSc: 0-No pain      Patients Stated Pain Goal: 3 ([id number])  Complications: No apparent anesthesia complications

## 2016-05-30 NOTE — Progress Notes (Signed)
PHARMACIST - PHYSICIAN ORDER COMMUNICATION  CONCERNING: P&T Medication Policy on Herbal Medications  DESCRIPTION:  This patient's order for:  garlic  has been noted.  This product(s) is classified as an "herbal" or natural product. Due to a lack of definitive safety studies or FDA approval, nonstandard manufacturing practices, plus the potential risk of unknown drug-drug interactions while on inpatient medications, the Pharmacy and Therapeutics Committee does not permit the use of "herbal" or natural products of this type within [facility].   ACTION TAKEN: The pharmacy department is unable to verify this order at this time and your patient has been informed of this safety policy. Please reevaluate patient's clinical condition at discharge and address if the herbal or natural product(s) should be resumed at that time.   

## 2016-05-30 NOTE — Progress Notes (Signed)
Mrsa pcr sent to lab. Consents signed for surgery. CHG bath complete.

## 2016-05-30 NOTE — Telephone Encounter (Signed)
Patient called stating she broke her hip and is currently in the hospital. She will call to resched appt when she gets out. FYI

## 2016-05-30 NOTE — Progress Notes (Signed)
[place] at [facility]                                                                                                                                                                                  Patient Demographics   Wanda [name], is a 61 y.o. female, DOB - 07/17/1955, [id number]  Admit date - 05/29/2016   Admitting Physician [name], MD  Outpatient Primary MD for the patient is SOLES, MEREDITH KEY, MD   LOS - 1  Subjective: Pt  Postop has some pain but other than that no chest pain or shortness of breath    Review of Systems:   CONSTITUTIONAL: No documented fever. No fatigue, weakness. No weight gain, no weight loss.  EYES: No blurry or double vision.  ENT: No tinnitus. No postnasal drip. No redness of the oropharynx.  RESPIRATORY: No cough, no wheeze, no hemoptysis. No dyspnea.  CARDIOVASCULAR: No chest pain. No orthopnea. No palpitations. No syncope.  GASTROINTESTINAL: No nausea, no vomiting or diarrhea. No abdominal pain. No melena or hematochezia.  GENITOURINARY: No dysuria or hematuria.  ENDOCRINE: No polyuria or nocturia. No heat or cold intolerance.  HEMATOLOGY: No anemia. No bruising. No bleeding.  INTEGUMENTARY: No rashes. No lesions.  MUSCULOSKELETAL: Left hip pain NEUROLOGIC: No numbness, tingling, or ataxia. No seizure-type activity.  PSYCHIATRIC: No anxiety. No insomnia. No ADD.    Vitals:   Vitals:   05/30/16 1230 05/30/16 1248 05/30/16 1341 05/30/16 1428  BP: 112/76 (!) 102/56 104/64 109/67  Pulse: 99 93 98 81  Resp: 13  16   Temp: 97.6 F (36.4 C)  98 F (36.7 C) 97.7 F (36.5 C)  TempSrc:   Oral Oral  SpO2: 96%  94% 98%  Weight:      Height:        Wt Readings from Last 3 Encounters:  05/29/16 146 lb 3.2 oz (66.3 kg)  04/05/16 142 lb (64.4 kg)  03/29/16 142 lb (64.4 kg)     Intake/Output Summary (Last 24 hours) at 05/30/16 1537 Last data filed at 05/30/16 1230  Gross per 24 hour  Intake              4428 ml  Output              375 ml  Net             4053 ml    Physical Exam:   GENERAL: Pleasant-appearing in no apparent distress.  HEAD, EYES, EARS, NOSE AND THROAT: Atraumatic, normocephalic. Extraocular muscles are intact. Pupils equal and reactive to light. Sclerae anicteric. No conjunctival injection. No oro-pharyngeal erythema.  NECK: Supple. There is  no jugular venous distention. No bruits, no lymphadenopathy, no thyromegaly.  HEART: Regular rate and rhythm,. No murmurs, no rubs, no clicks.  LUNGS: Clear to auscultation bilaterally. No rales or rhonchi. No wheezes.  ABDOMEN: Soft, flat, nontender, nondistended. Has good bowel sounds. No hepatosplenomegaly appreciated.  EXTREMITIES: No evidence of any cyanosis, clubbing, or peripheral edema.  +2 pedal and radial pulses bilaterally.  NEUROLOGIC: The patient is alert, awake, and oriented x3 with no focal motor or sensory deficits appreciated bilaterally.  SKIN: Moist and warm with no rashes appreciated.  Psych: Not anxious, depressed LN: No inguinal LN enlargement    Antibiotics   Anti-infectives    Start     Dose/Rate Route Frequency Ordered Stop   05/30/16 1600  ceFAZolin (ANCEF) IVPB 2g/100 mL premix     2 g 200 mL/hr over 30 Minutes Intravenous Every 6 hours 05/30/16 1304 05/31/16 0359   05/30/16 0930  ceFAZolin (ANCEF) IVPB 2g/100 mL premix  Status:  Discontinued     2 g 200 mL/hr over 30 Minutes Intravenous NOW 05/30/16 0916 05/30/16 1304   05/29/16 1000  valACYclovir (VALTREX) tablet 1,000 mg     1,000 mg Oral Daily 05/29/16 0637        Medications   Scheduled Meds: . atorvastatin  40 mg Oral q1800  .  ceFAZolin (ANCEF) IV  2 g Intravenous Q6H  . docusate sodium  100 mg Oral BID  . [START ON 05/31/2016] enoxaparin (LOVENOX) injection  40 mg Subcutaneous Q24H  . ferrous sulfate  325 mg Oral TID PC  . multivitamin with minerals  1 tablet Oral Daily  . nadolol  20 mg Oral Daily  . prazosin  2 mg Oral QHS   . QUEtiapine  25 mg Oral QHS  . senna  1 tablet Oral BID  . sertraline  50 mg Oral QHS  . valACYclovir  1,000 mg Oral Daily   Continuous Infusions: . sodium chloride 75 mL/hr (05/30/16 1222)   PRN Meds:.acetaminophen **OR** acetaminophen, alum & mag hydroxide-simeth, bisacodyl, clonazePAM, HYDROmorphone (DILAUDID) injection, magnesium citrate, menthol-cetylpyridinium **OR** phenol, methocarbamol **OR** methocarbamol (ROBAXIN)  IV, ondansetron **OR** ondansetron (ZOFRAN) IV, oxyCODONE, polyethylene glycol   Data Review:   Micro Results Recent Results (from the past 240 hour(s))  Surgical pcr screen     Status: Abnormal   Collection Time: 05/30/16  8:11 AM  Result Value Ref Range Status   MRSA, PCR NEGATIVE NEGATIVE Final   Staphylococcus aureus POSITIVE (A) NEGATIVE Final    Comment:        The Xpert SA Assay (FDA approved for NASAL specimens in patients over 53 years of age), is one component of a comprehensive surveillance program.  Test performance has been validated by [facility] for patients greater than or equal to 11 year old. It is not intended to diagnose infection nor to guide or monitor treatment.     Radiology Reports Ct Head Wo Contrast  Result Date: 05/29/2016 CLINICAL DATA:  Fall with hip fracture, syncopal episode EXAM: CT HEAD WITHOUT CONTRAST TECHNIQUE: Contiguous axial images were obtained from the base of the skull through the vertex without intravenous contrast. COMPARISON:  None. FINDINGS: Brain: No evidence for acute territorial infarction, intracranial hemorrhage or focal mass lesion. No midline shift. Ventricles are nonenlarged. Minimal atrophy. Vascular: No hyperdense vessels.  No unexpected calcifications. Skull: Mastoid air cells are clear. No skull fracture is visualized. Sinuses/Orbits: Minimal fluid within the left sphenoid sinus. Mild mucosal thickening in the ethmoid sinuses. No acute orbital  abnormality. Other: None IMPRESSION: No CT evidence  for acute intracranial abnormality. Electronically Signed   By: [name] M.D.   On: 05/29/2016 03:41   Dg Chest 1 View  Result Date: 05/29/2016 CLINICAL DATA:  Preop, fall with hip fracture EXAM: CHEST 1 VIEW COMPARISON:  06/23/2010 FINDINGS: There is no focal pulmonary infiltrate, consolidation or effusion. Cardiomediastinal silhouette is nonenlarged. There is no pneumothorax. Stimulator leads are visualized over the spine. IMPRESSION: No radiographic evidence for acute cardiopulmonary abnormality Electronically Signed   By: [name] M.D.   On: 05/29/2016 03:37   Dg C-arm 61-120 Min  Result Date: 05/30/2016 CLINICAL DATA:  Intramedullary nail EXAM: LEFT FEMUR 2 VIEWS; DG C-ARM 61-120 MIN COMPARISON:  03/29/2017 FINDINGS: Internal fixation across the left intertrochanteric femoral fracture. Near anatomic alignment. No hardware complicating feature. IMPRESSION: Internal fixation.  No complicating feature. Electronically Signed   By: Rolm Baptise M.D.   On: 05/30/2016 11:24   Dg Hip Unilat W Or Wo Pelvis 2-3 Views Left  Result Date: 05/29/2016 CLINICAL DATA:  Fall with left hip pain EXAM: DG HIP (WITH OR WITHOUT PELVIS) 2-3V LEFT COMPARISON:  10/18/2015 FINDINGS: Left lower quadrant generator again noted. The right femoral head appears normally positioned and there are degenerative changes of the right hip as before. Pubic symphysis appears intact. Mildly comminuted intertrochanteric fracture of the proximal left femur are with mild varus angulation. Left femoral head projects in joint. IMPRESSION: Acute mildly angulated left intertrochanteric fracture. Electronically Signed   By: [name] M.D.   On: 05/29/2016 03:35   Dg Femur Min 2 Views Left  Result Date: 05/30/2016 CLINICAL DATA:  Status post intertrochanteric hip fracture. EXAM: LEFT FEMUR 2 VIEWS COMPARISON:  Multiple priors. FINDINGS: Intramedullary rod and screw placement across a mildly angulated LEFT intertrochanteric fracture.  Improved position and alignment. IMPRESSION: Satisfactory ORIF. Electronically Signed   By: Staci Righter M.D.   On: 05/30/2016 12:13   Dg Femur Min 2 Views Left  Result Date: 05/30/2016 CLINICAL DATA:  Intramedullary nail EXAM: LEFT FEMUR 2 VIEWS; DG C-ARM 61-120 MIN COMPARISON:  03/29/2017 FINDINGS: Internal fixation across the left intertrochanteric femoral fracture. Near anatomic alignment. No hardware complicating feature. IMPRESSION: Internal fixation.  No complicating feature. Electronically Signed   By: Rolm Baptise M.D.   On: 05/30/2016 11:24     CBC  Recent Labs Lab 05/29/16 0304  WBC 9.3  HGB 12.9  HCT 36.1  PLT 226  MCV 101.3*  MCH 36.2*  MCHC 35.8  RDW 13.0    Chemistries   Recent Labs Lab 05/29/16 0304  NA 140  K 3.3*  CL 108  CO2 24  GLUCOSE 158*  BUN 6  CREATININE 0.62  CALCIUM 8.6*   ------------------------------------------------------------------------------------------------------------------ estimated creatinine clearance is 65.2 mL/min (by C-G formula based on SCr of 0.62 mg/dL). ------------------------------------------------------------------------------------------------------------------  Recent Labs  05/29/16 0304  HGBA1C 5.4   ------------------------------------------------------------------------------------------------------------------ No results for input(s): CHOL, HDL, LDLCALC, TRIG, CHOLHDL, LDLDIRECT in the last 72 hours. ------------------------------------------------------------------------------------------------------------------  Recent Labs  05/29/16 0304  TSH 5.193*   ------------------------------------------------------------------------------------------------------------------ No results for input(s): VITAMINB12, FOLATE, FERRITIN, TIBC, IRON, RETICCTPCT in the last 72 hours.  Coagulation profile No results for input(s): INR, PROTIME in the last 168 hours.  No results for input(s): DDIMER in the last 72  hours.  Cardiac Enzymes  Recent Labs Lab 05/29/16 0304  TROPONINI <0.03   ------------------------------------------------------------------------------------------------------------------ Invalid input(s): POCBNP    Assessment & Plan   Patient is a 61 year old admitted with  a syncopal episode with a hip fracture  1.hip fx s/p repair supportive care   2.  Syncope unclear etiology:  Echo normal , cartoid dopplers tomm  3.  Hyperlipidemia unspecified continue atorvastatin    4. Essential hypertension continue continue nadolol  5. GERD we'll continue Protonix  6. Miscellaneous Lovenox postop      Code Status Orders        Start     Ordered   05/29/16 [id number]  Full code  Continuous     05/29/16 0637    Code Status History    Date Active Date Inactive Code Status Order ID Comments User Context   This patient has a current code status but no historical code status.           Consults Orthopedics   DVT Prophylaxis  Lovenox   Lab Results  Component Value Date   PLT 226 05/29/2016     Time Spent in minutes 54min Greater than 50% of time spent in care coordination and counseling patient regarding the condition and plan of care.   Dustin Flock M.D on 05/30/2016 at 3:37 PM  Between 7am to 6pm - Pager - [phone]  After 6pm go to [email or web address] - password [email or web address] [place] [facility]   Office  [phone]

## 2016-05-30 NOTE — Progress Notes (Signed)
Pt remained afebrile during the night. Npo after midnight. No acute distress noted. Medicated for pain with minimal relief

## 2016-05-30 NOTE — Progress Notes (Signed)
PROGRESS NOTE  Chief Complaint: left intertrochanteric hip fracture s/p fall  HPI: Wanda [name] is a 61 y.o. female who presents for a left hip fracture s/p fall at home on Monday AM. Given the displacement, intramedullary fixation has been recommended to the patient.    Past Medical History:  Diagnosis Date  . Anxiety   . BRONCHITIS, ACUTE WITH BRONCHOSPASM 06/25/2010   Qualifier: Diagnosis of  By: Alveta Heimlich MD, Cornelia Copa    . Depression   . GASTROENTERITIS WITHOUT DEHYDRATION 05/14/2009   Qualifier: Diagnosis of  By: Deborra Medina MD, Tanja Port    . GERD (gastroesophageal reflux disease)   . Herpes   . 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 Hyperlipidemia   . Irregular heart beat    Past Surgical History:  Procedure Laterality Date  . ABDOMINAL HYSTERECTOMY    . APPENDECTOMY    . BACK SURGERY     X 3  . BREAST EXCISIONAL BIOPSY Right    surgical bx age 36   . COLONOSCOPY N/A 08/28/2013   Procedure: COLONOSCOPY;  Surgeon: Danie Binder, MD;  Location: [facility];  Service: Endoscopy;  Laterality: N/A;  8:30 AM  . SHOULDER SURGERY    . Spinal cord stimulator     Social History   Social History  . Marital status: Single    Spouse name: N/A  . Number of children: N/A  . Years of education: N/A   Social History Main Topics  . Smoking status: Former Smoker    Packs/day: 0.25    Years: 14.00    Types: Cigarettes  . Smokeless tobacco: Former Systems developer    Quit date: 08/29/1990  . Alcohol use Yes     Comment: occasional  . Drug use: No  . Sexual activity: Yes    Birth control/ protection: Surgical   Other Topics Concern  . None   Social History Narrative  . None   Family History  Problem Relation Age of Onset  . Diabetes Mother   . COPD Mother   . Heart disease Mother   . Diabetes Father   . COPD Father   . Heart disease Father   . Alcohol abuse Daughter   . Depression Daughter   . Tuberculosis Maternal Aunt   . Cancer Paternal Aunt     breast   . Diabetes Paternal Grandmother   . Diabetes Sister   . Cancer Cousin   . Diabetes Cousin   . Colon cancer Neg Hx    Allergies  Allergen Reactions  . Levofloxacin     REACTION: N \\T \ V  . Propoxyphene Hcl     REACTION: N \\T \ V  . Codeine Nausea Only    Slight nausea   Prior to Admission medications   Medication Sig Start Date End Date Taking? Authorizing Provider  atorvastatin (LIPITOR) 40 MG tablet Take by mouth.   Yes Historical Provider, MD  clonazePAM (KLONOPIN) 0.5 MG tablet take 1/2 tablet twice a day and 1/2 to 1 tablet at bedtime 03/20/16  Yes Historical Provider, MD  Garlic [id number] MG CAPS Take 1 capsule by mouth daily.   Yes Historical Provider, MD  Multiple Vitamin (MULTIVITAMIN) tablet Take 1 tablet by mouth daily.   Yes Historical Provider, MD  nadolol (CORGARD) 20 MG tablet Take 20 mg by mouth daily.     Yes Historical Provider, MD  pantoprazole (PROTONIX) 40 MG tablet Take 40 mg by mouth 2 (two) times daily.    Yes Historical  Provider, MD  prazosin (MINIPRESS) 2 MG capsule Take 2 mg by mouth at bedtime. 01/10/16  Yes Historical Provider, MD  QUEtiapine (SEROQUEL) 25 MG tablet Take 1-2 tablets by mouth at bedtime. 05/05/16  Yes Historical Provider, MD  sertraline (ZOLOFT) 50 MG tablet take 1/2 tablet by mouth once daily for 7 days then 1 tablet once daily THERAFTER 04/03/16  Yes Historical Provider, MD  valACYclovir (VALTREX) 1000 MG tablet Take 1 tablet (1,000 mg total) by mouth daily. 07/31/13  Yes Estill Dooms, NP  buPROPion (WELLBUTRIN XL) 300 MG 24 hr tablet Take 300 mg by mouth daily. 02/22/16   Historical Provider, MD  busPIRone (BUSPAR) 15 MG tablet Take 15 mg by mouth 2 (two) times daily.    Historical Provider, MD  diclofenac (VOLTAREN) 75 MG EC tablet Take 75 mg by mouth 2 (two) times daily. 02/09/16   Historical Provider, MD     Positive ROS: All other systems have been reviewed and were otherwise negative with the exception of those mentioned in the HPI and  as above.  Physical Exam: General: Alert, no acute distress  MUSCULOSKELETAL: LEFT LOWER EXTREMITY:  Patient's skin intact overlying the left hip.  There is no erythema, ecchymosis or significant swelling.  Hip compartments are soft and compressible.  Patient is NVI distally.  TEDs and foot pumps in place.  Pedal pulses are palpable.    Assessment: Displaced left intertrochanteric hip fracture  Plan: Plan for Procedure(s): INTRAMEDULLARY (IM) NAIL INTERTROCHANTRIC LEFT HIP FRACTURE  I discussed the risks and benefits of surgery. The risks include but are not limited to infection, bleeding requiring blood transfusion, nerve or blood vessel injury, joint stiffness or loss of motion, persistent pain, weakness or instability, malunion, nonunion and hardware failure and the need for further surgery. Medical risks include but are not limited to DVT and pulmonary embolism, myocardial infarction, stroke, pneumonia, respiratory failure and death. Patient understood these risks and wished to proceed.   I have reviewed the radiographs and labs in preparation for this case.    She has been medically cleared by the hospitalist service for surgery.    Thornton Park, MD   05/30/2016 9:16 AM

## 2016-05-30 NOTE — Anesthesia Procedure Notes (Signed)
Procedure Name: Intubation Date/Time: 05/30/2016 9:56 AM Performed by: Marsh Dolly Pre-anesthesia Checklist: Patient identified, Patient being monitored, Timeout performed, Emergency Drugs available and Suction available Patient Re-evaluated:Patient Re-evaluated prior to inductionOxygen Delivery Method: Circle system utilized Preoxygenation: Pre-oxygenation with 100% oxygen Intubation Type: IV induction Ventilation: Mask ventilation without difficulty Laryngoscope Size: 3 and Glidescope Grade View: Grade I Tube type: Oral Tube size: 7.0 mm Number of attempts: 1 Airway Equipment and Method: Stylet Placement Confirmation: ETT inserted through vocal cords under direct vision,  positive ETCO2 and breath sounds checked- equal and bilateral Secured at: 21 cm Tube secured with: Tape Dental Injury: Teeth and Oropharynx as per pre-operative assessment

## 2016-05-30 NOTE — Anesthesia Postprocedure Evaluation (Signed)
Anesthesia Post Note  Patient: CHIZARAM BIGG  Procedure(s) Performed: Procedure(s) (LRB): INTRAMEDULLARY (IM) NAIL INTERTROCHANTRIC (Left)  Patient location during evaluation: PACU Anesthesia Type: General Level of consciousness: awake and alert Pain management: pain level controlled Vital Signs Assessment: post-procedure vital signs reviewed and stable Respiratory status: spontaneous breathing, nonlabored ventilation, respiratory function stable and patient connected to nasal cannula oxygen Cardiovascular status: blood pressure returned to baseline and stable Postop Assessment: no signs of nausea or vomiting Anesthetic complications: no     Last Vitals:  Vitals:   05/30/16 1230 05/30/16 1248  BP: 112/76 (!) 102/56  Pulse: 99 93  Resp: 13   Temp: 36.4 C     Last Pain:  Vitals:   05/30/16 1230  TempSrc:   PainSc: Asleep                 Precious Haws Kaelyn Nauta

## 2016-05-31 ENCOUNTER — Inpatient Hospital Stay: Payer: Medicare PPO

## 2016-05-31 ENCOUNTER — Encounter: Payer: Self-pay | Admitting: Orthopedic Surgery

## 2016-05-31 ENCOUNTER — Ambulatory Visit: Payer: BLUE CROSS/BLUE SHIELD | Admitting: Pain Medicine

## 2016-05-31 LAB — CBC
HCT: 25.9 % — ABNORMAL LOW (ref 35.0–47.0)
Hemoglobin: 9.4 g/dL — ABNORMAL LOW (ref 12.0–16.0)
MCH: 36.9 pg — ABNORMAL HIGH (ref 26.0–34.0)
MCHC: 36.3 g/dL — ABNORMAL HIGH (ref 32.0–36.0)
MCV: 101.7 fL — ABNORMAL HIGH (ref 80.0–100.0)
PLATELETS: 158 10*3/uL (ref 150–440)
RBC: 2.54 MIL/uL — AB (ref 3.80–5.20)
RDW: 12.6 % (ref 11.5–14.5)
WBC: 11.8 10*3/uL — AB (ref 3.6–11.0)

## 2016-05-31 LAB — BASIC METABOLIC PANEL
ANION GAP: 5 (ref 5–15)
CO2: 26 mmol/L (ref 22–32)
Calcium: 8.1 mg/dL — ABNORMAL LOW (ref 8.9–10.3)
Chloride: 109 mmol/L (ref 101–111)
Creatinine, Ser: 0.37 mg/dL — ABNORMAL LOW (ref 0.44–1.00)
Glucose, Bld: 132 mg/dL — ABNORMAL HIGH (ref 65–99)
POTASSIUM: 3.5 mmol/L (ref 3.5–5.1)
SODIUM: 140 mmol/L (ref 135–145)

## 2016-05-31 MED ORDER — CHLORHEXIDINE GLUCONATE CLOTH 2 % EX PADS
6.0000 | MEDICATED_PAD | Freq: Every day | CUTANEOUS | Status: DC
  Administered 2016-05-31 – 2016-06-03 (×3): 6 via TOPICAL

## 2016-05-31 MED ORDER — MUPIROCIN 2 % EX OINT
1.0000 "application " | TOPICAL_OINTMENT | Freq: Two times a day (BID) | CUTANEOUS | Status: DC
  Administered 2016-05-31 – 2016-06-04 (×9): 1 via NASAL
  Filled 2016-05-31: qty 22

## 2016-05-31 MED ORDER — PANTOPRAZOLE SODIUM 40 MG PO TBEC
40.0000 mg | DELAYED_RELEASE_TABLET | Freq: Two times a day (BID) | ORAL | Status: DC
  Administered 2016-05-31 – 2016-06-04 (×8): 40 mg via ORAL
  Filled 2016-05-31 (×8): qty 1

## 2016-05-31 MED ORDER — ENSURE ENLIVE PO LIQD
237.0000 mL | Freq: Three times a day (TID) | ORAL | Status: DC
  Administered 2016-05-31 – 2016-06-04 (×8): 237 mL via ORAL

## 2016-05-31 MED ORDER — SODIUM CHLORIDE 0.9 % IV SOLN
INTRAVENOUS | Status: DC
  Administered 2016-05-31 (×2): via INTRAVENOUS

## 2016-05-31 NOTE — Progress Notes (Addendum)
[place] at [facility]                                                                                                                                                                                  Patient Demographics   Wanda [name], is a 61 y.o. female, DOB - 12/04/1955, [id number]  Admit date - 05/29/2016   Admitting Physician [name], MD  Outpatient Primary MD for the patient is SOLES, MEREDITH KEY, MD   LOS - 2  Subjective: Pt feeling dizziness with getting up no significant pain   Review of Systems:   CONSTITUTIONAL: No documented fever. No fatigue, weakness. No weight gain, no weight loss.  EYES: No blurry or double vision.  ENT: No tinnitus. No postnasal drip. No redness of the oropharynx.  RESPIRATORY: No cough, no wheeze, no hemoptysis. No dyspnea.  CARDIOVASCULAR: No chest pain. No orthopnea. No palpitations. No syncope.  GASTROINTESTINAL: No nausea, no vomiting or diarrhea. No abdominal pain. No melena or hematochezia.  GENITOURINARY: No dysuria or hematuria.  ENDOCRINE: No polyuria or nocturia. No heat or cold intolerance.  HEMATOLOGY: No anemia. No bruising. No bleeding.  INTEGUMENTARY: No rashes. No lesions.  MUSCULOSKELETAL: Left hip pain NEUROLOGIC: No numbness, tingling, or ataxia. No seizure-type activity.  PSYCHIATRIC: No anxiety. No insomnia. No ADD.    Vitals:   Vitals:   05/31/16 0752 05/31/16 1210 05/31/16 1213 05/31/16 1216  BP: (!) 100/59 95/64 (!) 94/56 (!) 89/56  Pulse: 96 93 94 93  Resp: 16     Temp: 98.5 F (36.9 C)     TempSrc: Oral     SpO2: 97% 94% 100% 100%  Weight:      Height:        Wt Readings from Last 3 Encounters:  05/29/16 146 lb 3.2 oz (66.3 kg)  04/05/16 142 lb (64.4 kg)  03/29/16 142 lb (64.4 kg)     Intake/Output Summary (Last 24 hours) at 05/31/16 1319 Last data filed at 05/31/16 0300  Gross per 24 hour  Intake              240 ml  Output             2150 ml  Net             -1910 ml    Physical Exam:   GENERAL: Pleasant-appearing in no apparent distress.  HEAD, EYES, EARS, NOSE AND THROAT: Atraumatic, normocephalic. Extraocular muscles are intact. Pupils equal and reactive to light. Sclerae anicteric. No conjunctival injection. No oro-pharyngeal erythema.  NECK: Supple. There is no jugular venous distention. No bruits, no lymphadenopathy, no thyromegaly.  HEART: Regular rate  and rhythm,. No murmurs, no rubs, no clicks.  LUNGS: Clear to auscultation bilaterally. No rales or rhonchi. No wheezes.  ABDOMEN: Soft, flat, nontender, nondistended. Has good bowel sounds. No hepatosplenomegaly appreciated.  EXTREMITIES: No evidence of any cyanosis, clubbing, or peripheral edema.  +2 pedal and radial pulses bilaterally.  NEUROLOGIC: The patient is alert, awake, and oriented x3 with no focal motor or sensory deficits appreciated bilaterally.  SKIN: Moist and warm with no rashes appreciated.  Psych: Not anxious, depressed LN: No inguinal LN enlargement    Antibiotics   Anti-infectives    Start     Dose/Rate Route Frequency Ordered Stop   05/30/16 1600  ceFAZolin (ANCEF) IVPB 2g/100 mL premix     2 g 200 mL/hr over 30 Minutes Intravenous Every 6 hours 05/30/16 1304 05/30/16 2215   05/30/16 0930  ceFAZolin (ANCEF) IVPB 2g/100 mL premix  Status:  Discontinued     2 g 200 mL/hr over 30 Minutes Intravenous NOW 05/30/16 0916 05/30/16 1304   05/29/16 1000  valACYclovir (VALTREX) tablet 1,000 mg     1,000 mg Oral Daily 05/29/16 0637        Medications   Scheduled Meds: . atorvastatin  40 mg Oral q1800  . docusate sodium  100 mg Oral BID  . enoxaparin (LOVENOX) injection  40 mg Subcutaneous Q24H  . feeding supplement (ENSURE ENLIVE)  237 mL Oral TID BM  . ferrous sulfate  325 mg Oral TID PC  . multivitamin with minerals  1 tablet Oral Daily  . nadolol  20 mg Oral Daily  . prazosin  2 mg Oral QHS  . QUEtiapine  25 mg Oral QHS  . senna  1 tablet Oral BID  .  sertraline  50 mg Oral QHS  . valACYclovir  1,000 mg Oral Daily   Continuous Infusions:  PRN Meds:.acetaminophen **OR** acetaminophen, alum & mag hydroxide-simeth, bisacodyl, clonazePAM, HYDROmorphone (DILAUDID) injection, magnesium citrate, menthol-cetylpyridinium **OR** phenol, methocarbamol **OR** methocarbamol (ROBAXIN)  IV, ondansetron **OR** ondansetron (ZOFRAN) IV, oxyCODONE, polyethylene glycol   Data Review:   Micro Results Recent Results (from the past 240 hour(s))  Surgical pcr screen     Status: Abnormal   Collection Time: 05/30/16  8:11 AM  Result Value Ref Range Status   MRSA, PCR NEGATIVE NEGATIVE Final   Staphylococcus aureus POSITIVE (A) NEGATIVE Final    Comment:        The Xpert SA Assay (FDA approved for NASAL specimens in patients over 8 years of age), is one component of a comprehensive surveillance program.  Test performance has been validated by [facility] for patients greater than or equal to 30 year old. It is not intended to diagnose infection nor to guide or monitor treatment.     Radiology Reports Ct Head Wo Contrast  Result Date: 05/29/2016 CLINICAL DATA:  Fall with hip fracture, syncopal episode EXAM: CT HEAD WITHOUT CONTRAST TECHNIQUE: Contiguous axial images were obtained from the base of the skull through the vertex without intravenous contrast. COMPARISON:  None. FINDINGS: Brain: No evidence for acute territorial infarction, intracranial hemorrhage or focal mass lesion. No midline shift. Ventricles are nonenlarged. Minimal atrophy. Vascular: No hyperdense vessels.  No unexpected calcifications. Skull: Mastoid air cells are clear. No skull fracture is visualized. Sinuses/Orbits: Minimal fluid within the left sphenoid sinus. Mild mucosal thickening in the ethmoid sinuses. No acute orbital abnormality. Other: None IMPRESSION: No CT evidence for acute intracranial abnormality. Electronically Signed   By: [name] M.D.   On: 05/29/2016  03:41    Dg Chest 1 View  Result Date: 05/29/2016 CLINICAL DATA:  Preop, fall with hip fracture EXAM: CHEST 1 VIEW COMPARISON:  06/23/2010 FINDINGS: There is no focal pulmonary infiltrate, consolidation or effusion. Cardiomediastinal silhouette is nonenlarged. There is no pneumothorax. Stimulator leads are visualized over the spine. IMPRESSION: No radiographic evidence for acute cardiopulmonary abnormality Electronically Signed   By: [name] M.D.   On: 05/29/2016 03:37   Dg C-arm 61-120 Min  Result Date: 05/30/2016 CLINICAL DATA:  Intramedullary nail EXAM: LEFT FEMUR 2 VIEWS; DG C-ARM 61-120 MIN COMPARISON:  03/29/2017 FINDINGS: Internal fixation across the left intertrochanteric femoral fracture. Near anatomic alignment. No hardware complicating feature. IMPRESSION: Internal fixation.  No complicating feature. Electronically Signed   By: Rolm Baptise M.D.   On: 05/30/2016 11:24   Dg Hip Unilat W Or Wo Pelvis 2-3 Views Left  Result Date: 05/29/2016 CLINICAL DATA:  Fall with left hip pain EXAM: DG HIP (WITH OR WITHOUT PELVIS) 2-3V LEFT COMPARISON:  10/18/2015 FINDINGS: Left lower quadrant generator again noted. The right femoral head appears normally positioned and there are degenerative changes of the right hip as before. Pubic symphysis appears intact. Mildly comminuted intertrochanteric fracture of the proximal left femur are with mild varus angulation. Left femoral head projects in joint. IMPRESSION: Acute mildly angulated left intertrochanteric fracture. Electronically Signed   By: [name] M.D.   On: 05/29/2016 03:35   Dg Femur Min 2 Views Left  Result Date: 05/30/2016 CLINICAL DATA:  Status post intertrochanteric hip fracture. EXAM: LEFT FEMUR 2 VIEWS COMPARISON:  Multiple priors. FINDINGS: Intramedullary rod and screw placement across a mildly angulated LEFT intertrochanteric fracture. Improved position and alignment. IMPRESSION: Satisfactory ORIF. Electronically Signed   By: Staci Righter  M.D.   On: 05/30/2016 12:13   Dg Femur Min 2 Views Left  Result Date: 05/30/2016 CLINICAL DATA:  Intramedullary nail EXAM: LEFT FEMUR 2 VIEWS; DG C-ARM 61-120 MIN COMPARISON:  03/29/2017 FINDINGS: Internal fixation across the left intertrochanteric femoral fracture. Near anatomic alignment. No hardware complicating feature. IMPRESSION: Internal fixation.  No complicating feature. Electronically Signed   By: Rolm Baptise M.D.   On: 05/30/2016 11:24     CBC  Recent Labs Lab 05/29/16 0304 05/31/16 0434  WBC 9.3 11.8*  HGB 12.9 9.4*  HCT 36.1 25.9*  PLT 226 158  MCV 101.3* 101.7*  MCH 36.2* 36.9*  MCHC 35.8 36.3*  RDW 13.0 12.6    Chemistries   Recent Labs Lab 05/29/16 0304 05/31/16 0434  NA 140 140  K 3.3* 3.5  CL 108 109  CO2 24 26  GLUCOSE 158* 132*  BUN 6 <5*  CREATININE 0.62 0.37*  CALCIUM 8.6* 8.1*   ------------------------------------------------------------------------------------------------------------------ estimated creatinine clearance is 65.2 mL/min (by C-G formula based on SCr of 0.37 mg/dL (L)). ------------------------------------------------------------------------------------------------------------------  Recent Labs  05/29/16 0304  HGBA1C 5.4   ------------------------------------------------------------------------------------------------------------------ No results for input(s): CHOL, HDL, LDLCALC, TRIG, CHOLHDL, LDLDIRECT in the last 72 hours. ------------------------------------------------------------------------------------------------------------------  Recent Labs  05/29/16 0304  TSH 5.193*   ------------------------------------------------------------------------------------------------------------------ No results for input(s): VITAMINB12, FOLATE, FERRITIN, TIBC, IRON, RETICCTPCT in the last 72 hours.  Coagulation profile No results for input(s): INR, PROTIME in the last 168 hours.  No results for input(s): DDIMER in the last  72 hours.  Cardiac Enzymes  Recent Labs Lab 05/29/16 0304  TROPONINI <0.03   ------------------------------------------------------------------------------------------------------------------ Invalid input(s): POCBNP    Assessment & Plan   Patient is a 61 year old admitted with a syncopal episode  with a hip fracture  1.hip fx s/p repair supportive care   2.  Syncope unclear etiology:  Echo normal , cartoid dopplers pending Dizziness with standing check orthostatics, possible due to pain meds, repeat cbc in am  3.  Hyperlipidemia unspecified continue atorvastatin    4. Essential hypertension bp low stop nadolol  5. GERD we'll continue Protonix  6. Miscellaneous Lovenox postop      Code Status Orders        Start     Ordered   05/29/16 [id number]  Full code  Continuous     05/29/16 0637    Code Status History    Date Active Date Inactive Code Status Order ID Comments User Context   This patient has a current code status but no historical code status.           Consults Orthopedics   DVT Prophylaxis  Lovenox   Lab Results  Component Value Date   PLT 158 05/31/2016     Time Spent in minutes 16min Greater than 50% of time spent in care coordination and counseling patient regarding the condition and plan of care.   Dustin Flock M.D on 05/31/2016 at 1:19 PM  Between 7am to 6pm - Pager - [phone]  After 6pm go to [email or web address] - password [email or web address] [facility]   Office  [phone]

## 2016-05-31 NOTE — Evaluation (Signed)
Occupational Therapy Evaluation Patient Details Name: Wanda [name] MRN: [id number] DOB: Apr 02, 1956 Today's Date: 05/31/2016    History of Present Illness Pt is a 61 y/o F s/p syncompal episode and fall, now s/p L hip IM nail.  Pt's PMH includes back surgery, shoulder surgery, spinal cord stimulator.   Clinical Impression   Patient seen for OT evaluation this date.  Patient limited by pain and dizziness this date with transfers. Patient lives alone in a handicapped apartment and was previously independent however she is planning to discharge home with her son and daughter in law.  She reports she just got a walker in the hospital, has a BSC, walk in shower with seat, grab bars and handicapped toilet at her apartment, she is unsure of the shower set up at her son's.  Patient now requiring increased assistance with basic self care tasks and functional mobility and would benefit from skilled OT to maximize her safety and independence in necessary daily tasks.  She will likely benefit from home health services.       Follow Up Recommendations  Home health OT    Equipment Recommendations       Recommendations for Other Services       Precautions / Restrictions Precautions Precautions: Fall;Other (comment) Precaution Comments: monitor BP Restrictions Weight Bearing Restrictions: Yes LLE Weight Bearing: Weight bearing as tolerated      Mobility Bed Mobility Overal bed mobility: Needs Assistance Bed Mobility: Supine to Sit;Sit to Supine     Supine to sit: Min assist;HOB elevated Sit to supine: Mod assist   General bed mobility comments: Min assist to manage LLE and advance LLE to EOB.  Cues for sequencing and pt requires min assist to elevate trunk.  To return to supine she requires assist to manage BLEs.  Transfers Overall transfer level: Needs assistance Equipment used: Ambulation equipment used Transfers: Sit to/from Stand Sit to Stand: Min guard         General  transfer comment: unable to perform sit to stand due to dizziness and pain.    Balance Overall balance assessment: Needs assistance;History of Falls Sitting-balance support: No upper extremity supported;Feet supported Sitting balance-Leahy Scale: Fair Sitting balance - Comments: Close min guard due to c/o dizziness   Standing balance support: Bilateral upper extremity supported;During functional activity Standing balance-Leahy Scale: Poor Standing balance comment: Relies on RW for support and close min guard for safety due to c/o dizziness                            ADL Overall ADL's : Needs assistance/impaired Eating/Feeding: Modified independent   Grooming: Modified independent       Lower Body Bathing: Moderate assistance   Upper Body Dressing : Modified independent   Lower Body Dressing: Maximal assistance     Toilet Transfer Details (indicate cue type and reason): not tested this date due to dizziness        Tub/Shower Transfer Details (indicate cue type and reason): not tested due to dizziness   General ADL Comments: Patient is requiring increased assistance with self care tasks, unable to tolerate sitting up and increased pain this date.  Will have to further assess transfers next session.      Vision     Perception     Praxis      Pertinent Vitals/Pain Pain Assessment: 0-10 Pain Score: 8  Pain Location: left hip Pain Descriptors / Indicators: Aching;Grimacing;Guarding Pain Intervention(s): Limited  activity within patient's tolerance;Monitored during session     Hand Dominance Right   Extremity/Trunk Assessment Upper Extremity Assessment Upper Extremity Assessment: Generalized weakness   Lower Extremity Assessment Lower Extremity Assessment: Defer to PT evaluation RLE Deficits / Details: strength grossly 4/5 LLE Deficits / Details: limited ROM and strength as expected s/p surgery documented above LLE: Unable to fully assess due to pain    Cervical / Trunk Assessment Cervical / Trunk Assessment: Kyphotic   Communication Communication Communication: No difficulties   Cognition Arousal/Alertness: Awake/alert Behavior During Therapy: WFL for tasks assessed/performed;Anxious Overall Cognitive Status: Within Functional Limits for tasks assessed                     General Comments       Exercises Exercises: Total Joint     Shoulder Instructions      Home Living Family/patient expects to be discharged to:: Private residence Living Arrangements: Children (lives alone but plans to stay with her son and daughter in law) Available Help at Discharge: Family;Available 24 hours/day Type of Home: Mobile home Home Access: Stairs to enter Entrance Stairs-Number of Steps: 8 Entrance Stairs-Rails: Right Home Layout: One level     Bathroom Shower/Tub: Corporate investment banker: Standard Bathroom Accessibility: Yes   Home Equipment: None   Additional Comments: had a walker delivered today      Prior Functioning/Environment Level of Independence: Independent                 OT Problem List: Decreased strength;Impaired balance (sitting and/or standing);Pain;Decreased range of motion;Decreased activity tolerance;Decreased knowledge of use of DME or AE   OT Treatment/Interventions: Self-care/ADL training;DME and/or AE instruction;Therapeutic exercise;Patient/family education    OT Goals(Current goals can be found in the care plan section) Acute Rehab OT Goals Patient Stated Goal: Patient wants to be able to return home and be independent OT Goal Formulation: With patient Time For Goal Achievement: 06/10/16 Potential to Achieve Goals: Good  OT Frequency: Min 1X/week   Barriers to D/C:            Co-evaluation              End of Session    Activity Tolerance: Patient limited by pain;Other (comment) (dizzy) Patient left: in bed;with call bell/phone within reach;with bed alarm  set   Time: 1420-1441 OT Time Calculation (min): 21 min Charges:  OT General Charges $OT Visit: 1 Procedure OT Evaluation $OT Eval Low Complexity: 1 Procedure G-Codes:    Samina Weekes  Khaleef Ruby T Kyrin Garn, OTR/L, CLT  05/31/2016, 2:55 PM

## 2016-05-31 NOTE — Evaluation (Signed)
Physical Therapy Evaluation Patient Details Name: Wanda [name] MRN: [id number] DOB: Dec 11, 1955 Today's Date: 05/31/2016   History of Present Illness  Pt is a 61 y/o F s/p syncompal episode and fall, now s/p L hip IM nail.  Pt's PMH includes back surgery, shoulder surgery, spinal cord stimulator.    Clinical Impression  Patient is s/p above surgery resulting in functional limitations due to the deficits listed below (see PT Problem List). Wanda [name] was Ind PTA but presents with weakness and limited LLE ROM as expected s/p surgery listed above. She performed sit<>stand x3 from EOB each time c/o dizziness and therefore ambulation was not attempted.  She requires up to mod assist for bed mobility for management of LEs.  She will have 24/7 assist available at d/c from her daughter in law.  She has 8 steps to enter her son and daughter in [place] home.   Patient will benefit from skilled PT to increase their independence and safety with mobility to allow discharge to the venue listed below.      Follow Up Recommendations Home health PT;Supervision for mobility/OOB    Equipment Recommendations  Rolling walker with 5" wheels    Recommendations for Other Services OT consult     Precautions / Restrictions Precautions Precautions: Fall;Other (comment) Precaution Comments: monitor BP Restrictions Weight Bearing Restrictions: Yes LLE Weight Bearing: Weight bearing as tolerated      Mobility  Bed Mobility Overal bed mobility: Needs Assistance Bed Mobility: Supine to Sit;Sit to Supine     Supine to sit: Min assist;HOB elevated Sit to supine: Mod assist   General bed mobility comments: Min assist to manage LLE and advance LLE to EOB.  Cues for sequencing and pt requires min assist to elevate trunk.  To return to supine she requires assist to manage BLEs.  Transfers Overall transfer level: Needs assistance Equipment used: Rolling walker (2 wheeled) Transfers: Sit to/from Stand Sit to  Stand: Min guard         General transfer comment: Sit<>stand x3 from EOB each time pt c/o dizziness that increases from sitting EOB.  Dizziness up to 8/10 and was assisted back to supine with dizziness clearing within 20 seconds.  Ambulation/Gait             General Gait Details: unable to assess  Stairs            Wheelchair Mobility    Modified Rankin (Stroke Patients Only)       Balance Overall balance assessment: Needs assistance;History of Falls Sitting-balance support: No upper extremity supported;Feet supported Sitting balance-Leahy Scale: Fair Sitting balance - Comments: Close min guard due to c/o dizziness   Standing balance support: Bilateral upper extremity supported;During functional activity Standing balance-Leahy Scale: Poor Standing balance comment: Relies on RW for support and close min guard for safety due to c/o dizziness                             Pertinent Vitals/Pain Pain Assessment: 0-10 Pain Score: 4  Pain Location: L hip  Pain Descriptors / Indicators: Aching;Grimacing;Guarding Pain Intervention(s): Limited activity within patient's tolerance;Monitored during session;Repositioned    Home Living Family/patient expects to be discharged to:: Private residence Living Arrangements: Children (will be staying with son and daughter in law at d/c) Available Help at Discharge: Family;Available 24 hours/day Type of Home: Mobile home Home Access: Stairs to enter Entrance Stairs-Rails: Right Entrance Stairs-Number of Steps: 8 Home Layout: One  level Home Equipment: None Additional Comments: Daughter in law will be able to provide 24/7 assist/supervision at d/c if needed    Prior Function Level of Independence: Independent               Hand Dominance        Extremity/Trunk Assessment   Upper Extremity Assessment Upper Extremity Assessment: Overall WFL for tasks assessed    Lower Extremity Assessment Lower  Extremity Assessment: RLE deficits/detail;LLE deficits/detail RLE Deficits / Details: strength grossly 4/5 LLE Deficits / Details: limited ROM and strength as expected s/p surgery documented above LLE: Unable to fully assess due to pain    Cervical / Trunk Assessment Cervical / Trunk Assessment: Kyphotic  Communication   Communication: No difficulties  Cognition Arousal/Alertness: Awake/alert Behavior During Therapy: WFL for tasks assessed/performed;Anxious Overall Cognitive Status: Within Functional Limits for tasks assessed                      General Comments      Exercises Total Joint Exercises Ankle Circles/Pumps: AROM;Both;10 reps;Supine Quad Sets: Strengthening;Both;10 reps;Supine Gluteal Sets: Strengthening;Both;10 reps;Supine Hip ABduction/ADduction: AAROM;Left;10 reps;Supine Straight Leg Raises: AAROM;Left;10 reps;Supine   Assessment/Plan    PT Assessment Patient needs continued PT services  PT Problem List Decreased strength;Decreased range of motion;Decreased activity tolerance;Decreased balance;Decreased mobility;Decreased knowledge of use of DME;Decreased safety awareness;Pain;Decreased knowledge of precautions          PT Treatment Interventions DME instruction;Gait training;Stair training;Functional mobility training;Therapeutic activities;Therapeutic exercise;Balance training;Neuromuscular re-education;Patient/family education;Modalities    PT Goals (Current goals can be found in the Care Plan section)  Acute Rehab PT Goals Patient Stated Goal: to feel better and go home PT Goal Formulation: With patient Time For Goal Achievement: 06/07/16 Potential to Achieve Goals: Good    Frequency BID   Barriers to discharge        Co-evaluation               End of Session Equipment Utilized During Treatment: Gait belt Activity Tolerance: Treatment limited secondary to medical complications (Comment) (dizziness, suspect orthostatic, RN  notified) Patient left: in bed;with call bell/phone within reach;with bed alarm set;with SCD's reapplied Nurse Communication: Mobility status;Other (comment) (pt appears to likely be orthostatic)         Time: [id number] PT Time Calculation (min) (ACUTE ONLY): 24 min   Charges:   PT Evaluation $PT Eval Low Complexity: 1 Procedure PT Treatments $Therapeutic Exercise: 8-22 mins   PT G Codes:        Collie Siad PT, DPT 05/31/2016, 11:34 AM

## 2016-05-31 NOTE — Progress Notes (Signed)
PT Cancellation Note  Patient Details Name: Wanda [name] MRN: [id number] DOB: [birth year]   Cancelled Treatment:    Reason Eval/Treat Not Completed: Patient at procedure or test/unavailable (off floor for [place]).  Will attempt to return to see pt again later today, schedule permitting.   Collie Siad PT, DPT 05/31/2016, 1:29 PM

## 2016-05-31 NOTE — Care Management Note (Signed)
Case Management Note  Patient Details  Name: Wanda [name] MRN: [id number] Date of Birth: March 02, 1956  Subjective/Objective:   Met with patient at bedside. She will be going home with her daughter at discharge for [place].  [facility] for [facility]. Will need a BSC, ordered from [facility]. She has a walker. PCP is  Dr. Gwynneth Aliment. Uses [facility].                Action/Plan:   Expected Discharge Date:                  Expected Discharge Plan:  [place]  In-House Referral:     Discharge planning Services  CM Consult  Post Acute Care Choice:  Durable Medical Equipment, Home Health Choice offered to:  Patient  DME Arranged:  Bedside commode DME Agency:  [place]:  PT [place]:  [place]  Status of Service:  In process, will continue to follow  If discussed at Long Length of Stay Meetings, dates discussed:    Additional Comments:  Jolly Mango, RN 05/31/2016, 2:29 PM

## 2016-05-31 NOTE — Progress Notes (Signed)
PT is recommending home health. RN case manager aware of above. Please reconsult if future social work needs arise. CSW signing off.   Deshante Cassell, LCSW [phone]  
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
## 2016-05-31 NOTE — Progress Notes (Signed)
Initial Nutrition Assessment  DOCUMENTATION CODES:   Not applicable  INTERVENTION:  1. Ensure Enlive po TID, each supplement provides 350 kcal and 20 grams of protein  NUTRITION DIAGNOSIS:   Inadequate oral intake related to chronic illness as evidenced by per patient/family report.  GOAL:   Patient will meet greater than or equal to 90% of their needs  MONITOR:   PO intake, I & O's, Labs, Weight trends, Supplement acceptance  REASON FOR ASSESSMENT:   Malnutrition Screening Tool    ASSESSMENT:   Wanda [name] is a 61 y.o. female who presents for a left hip fracture s/p fall at home on Monday AM. Given the displacement, intramedullary fixation has been recommended to the patient.    Spoke with Ms. Laroe at bedside. She reports poor appetite related to anxiety and depression. Eats 1 meal per day normally.   Denies any weight loss. Chart confirms. Admits to some nausea, no vomiting. Thinks it may be related to her feelings of diziness.  Documented meal completion 100% for 1/1, nothing documented since. Did not eat any breakfast this morning.  Nutrition-Focused physical exam completed. Findings are no fat depletion, no muscle depletion, and no edema.   Labs and medications reviewed: Iron, Senokot, MVI w/ Minerals, Colace NS @ 1104mL/hr  Diet Order:  Diet regular Room service appropriate? Yes; Fluid consistency: Thin  Skin:  Reviewed, no issues  Last BM:  05/27/2016  Height:   Ht Readings from Last 1 Encounters:  05/29/16 5\' 1"  (1.549 m)    Weight:   Wt Readings from Last 1 Encounters:  05/29/16 146 lb 3.2 oz (66.3 kg)    Ideal Body Weight:  47.72 kg  BMI:  Body mass index is 27.62 kg/m.  Estimated Nutritional Needs:   Kcal:  1300-1400 calories (MSJ x1.1-1.2)  Protein:  67-80 gm  Fluid:  >/= 1.3L  EDUCATION NEEDS:   No education needs identified at this time  Satira Anis. Ardice Boyan, MS, RD LDN Inpatient Clinical Dietitian Pager [phone]

## 2016-05-31 NOTE — Progress Notes (Signed)
PT Cancellation Note  Patient Details Name: Wanda [name] MRN: [id number] DOB: [birth year]   Cancelled Treatment:    Reason Eval/Treat Not Completed: Patient declined, no reason specified. Treatment attempted; pt notes having blood pressure checked recently and it is low. Per chart review, orthostatic blood pressures checked and all positions low. Results of bilateral carotid US neg for significant stenosis. Pt notes she is exhausted from days events and does not feel able to participate in PT even at bed level. Re attempt treatment tomorrow.    Larae Grooms, PTA 05/31/2016, 4:33 PM

## 2016-05-31 NOTE — Progress Notes (Signed)
Subjective:  Postoperative day 1 status post intramedullary fixation for left intertrochanteric hip fracture. Patient reports pain as moderate.  Her sensation attempted to get up to a chair today with physical therapy but was unable to do it this morning due to orthostatic hypotension.  Objective:   VITALS:   Vitals:   05/31/16 0752 05/31/16 1210 05/31/16 1213 05/31/16 1216  BP: (!) 100/59 95/64 (!) 94/56 (!) 89/56  Pulse: 96 93 94 93  Resp: 16     Temp: 98.5 F (36.9 C)     TempSrc: Oral     SpO2: 97% 94% 100% 100%  Weight:      Height:        PHYSICAL EXAM:  Left lower extremity: Patient's dressings has minimal serosanguinous drainage.  She has mild swelling but her thigh compartments are soft and compressible. There is no erythema or ecchymosis seen. Patient can dorsiflex and plantarflex her ankle and flex and extend her toes. She has palpable pedal pulses and intact sensation light touch throughout the left lower extremity.   LABS  Results for orders placed or performed during the hospital encounter of 05/29/16 (from the past 24 hour(s))  CBC     Status: Abnormal   Collection Time: 05/31/16  4:34 AM  Result Value Ref Range   WBC 11.8 (H) 3.6 - 11.0 K/uL   RBC 2.54 (L) 3.80 - 5.20 MIL/uL   Hemoglobin 9.4 (L) 12.0 - 16.0 g/dL   HCT 25.9 (L) 35.0 - 47.0 %   MCV 101.7 (H) 80.0 - 100.0 fL   MCH 36.9 (H) 26.0 - 34.0 pg   MCHC 36.3 (H) 32.0 - 36.0 g/dL   RDW 12.6 11.5 - 14.5 %   Platelets 158 150 - 440 K/uL  Basic metabolic panel     Status: Abnormal   Collection Time: 05/31/16  4:34 AM  Result Value Ref Range   Sodium 140 135 - 145 mmol/L   Potassium 3.5 3.5 - 5.1 mmol/L   Chloride 109 101 - 111 mmol/L   CO2 26 22 - 32 mmol/L   Glucose, Bld 132 (H) 65 - 99 mg/dL   BUN <5 (L) 6 - 20 mg/dL   Creatinine, Ser 0.37 (L) 0.44 - 1.00 mg/dL   Calcium 8.1 (L) 8.9 - 10.3 mg/dL   GFR calc non Af Amer >60 >60 mL/min   GFR calc Af Amer >60 >60 mL/min   Anion gap 5 5 - 15     Dg C-arm 61-120 Min  Result Date: 05/30/2016 CLINICAL DATA:  Intramedullary nail EXAM: LEFT FEMUR 2 VIEWS; DG C-ARM 61-120 MIN COMPARISON:  03/29/2017 FINDINGS: Internal fixation across the left intertrochanteric femoral fracture. Near anatomic alignment. No hardware complicating feature. IMPRESSION: Internal fixation.  No complicating feature. Electronically Signed   By: Rolm Baptise M.D.   On: 05/30/2016 11:24   Dg Femur Min 2 Views Left  Result Date: 05/30/2016 CLINICAL DATA:  Status post intertrochanteric hip fracture. EXAM: LEFT FEMUR 2 VIEWS COMPARISON:  Multiple priors. FINDINGS: Intramedullary rod and screw placement across a mildly angulated LEFT intertrochanteric fracture. Improved position and alignment. IMPRESSION: Satisfactory ORIF. Electronically Signed   By: Staci Righter M.D.   On: 05/30/2016 12:13   Dg Femur Min 2 Views Left  Result Date: 05/30/2016 CLINICAL DATA:  Intramedullary nail EXAM: LEFT FEMUR 2 VIEWS; DG C-ARM 61-120 MIN COMPARISON:  03/29/2017 FINDINGS: Internal fixation across the left intertrochanteric femoral fracture. Near anatomic alignment. No hardware complicating feature. IMPRESSION: Internal fixation.  No  complicating feature. Electronically Signed   By: Rolm Baptise M.D.   On: 05/30/2016 11:24    Assessment/Plan: 1 Day Post-Op   Active Problems:   Hip fracture ([place])  Patient's foley catheter has been removed. She will complete 24 hours postop antibiotics. Continue current pain management. Patient has a hemoglobin of 9 today.  Labs will be redrawn in the a.m.  Continue to monitor blood pressure. The require fluid support hypotension persists. Continue physical therapy.    Thornton Park , MD 05/31/2016, 12:54 PM

## 2016-06-01 LAB — CBC
HCT: 23.8 % — ABNORMAL LOW (ref 35.0–47.0)
HEMOGLOBIN: 8.4 g/dL — AB (ref 12.0–16.0)
MCH: 36.3 pg — AB (ref 26.0–34.0)
MCHC: 35.4 g/dL (ref 32.0–36.0)
MCV: 102.5 fL — ABNORMAL HIGH (ref 80.0–100.0)
Platelets: 151 10*3/uL (ref 150–440)
RBC: 2.32 MIL/uL — AB (ref 3.80–5.20)
RDW: 12.4 % (ref 11.5–14.5)
WBC: 8.7 10*3/uL (ref 3.6–11.0)

## 2016-06-01 LAB — PREPARE RBC (CROSSMATCH)

## 2016-06-01 LAB — BASIC METABOLIC PANEL
ANION GAP: 3 — AB (ref 5–15)
CHLORIDE: 109 mmol/L (ref 101–111)
CO2: 29 mmol/L (ref 22–32)
Calcium: 7.6 mg/dL — ABNORMAL LOW (ref 8.9–10.3)
Creatinine, Ser: 0.34 mg/dL — ABNORMAL LOW (ref 0.44–1.00)
GFR calc Af Amer: >60 mL/min (ref >60)
GFR calc non Af Amer: >60 mL/min (ref >60)
Glucose, Bld: 120 mg/dL — ABNORMAL HIGH (ref 65–99)
POTASSIUM: 2.9 mmol/L — AB (ref 3.5–5.1)
SODIUM: 141 mmol/L (ref 135–145)

## 2016-06-01 LAB — ABO/RH: ABO/RH(D): O POS

## 2016-06-01 LAB — CORTISOL: CORTISOL PLASMA: 10.8 ug/dL

## 2016-06-01 MED ORDER — SODIUM CHLORIDE 0.9 % IV SOLN
Freq: Once | INTRAVENOUS | Status: AC
  Administered 2016-06-01: 17:00:00 via INTRAVENOUS

## 2016-06-01 MED ORDER — POTASSIUM CHLORIDE CRYS ER 20 MEQ PO TBCR
40.0000 meq | EXTENDED_RELEASE_TABLET | Freq: Two times a day (BID) | ORAL | Status: AC
  Administered 2016-06-01 (×2): 40 meq via ORAL
  Filled 2016-06-01 (×2): qty 2

## 2016-06-01 MED ORDER — ACETAMINOPHEN 325 MG PO TABS
650.0000 mg | ORAL_TABLET | Freq: Once | ORAL | Status: AC
  Administered 2016-06-01: 650 mg via ORAL
  Filled 2016-06-01: qty 2

## 2016-06-01 MED ORDER — MIDODRINE HCL 5 MG PO TABS
5.0000 mg | ORAL_TABLET | Freq: Three times a day (TID) | ORAL | Status: DC
  Administered 2016-06-01 – 2016-06-02 (×4): 5 mg via ORAL
  Filled 2016-06-01 (×4): qty 1

## 2016-06-01 NOTE — Progress Notes (Signed)
Subjective:  Postoperative day #2 status post intramedullary fixation for left hip fracture. Patient reports pain as mild to moderate.  Patient's tolerated by mouth diet. She has no other complaints.  Objective:   VITALS:   Vitals:   06/01/16 0525 06/01/16 0802 06/01/16 1017 06/01/16 1224  BP: 109/61 106/65 (!) 79/56   Pulse: (!) 116 (!) 104 (!) 118 99  Resp: 18     Temp: 98.5 F (36.9 C) 98.8 F (37.1 C)    TempSrc: Oral Oral    SpO2: 94% 99%  98%  Weight:      Height:        PHYSICAL EXAM:  Left lower extremity: Patient's dressing has a small amount of sterile dressings drainage. There is no erythema or ecchymosis or sigmoid swelling. Thigh compartments are soft and compressible. She has no calf tenderness or lower leg edema. Patient is palpable pedal pulses, intact sensation light touch and can flex and extend her toes and dorsiflex and plantarflex her ankle.  LABS  Results for orders placed or performed during the hospital encounter of 05/29/16 (from the past 24 hour(s))  CBC     Status: Abnormal   Collection Time: 06/01/16  4:03 AM  Result Value Ref Range   WBC 8.7 3.6 - 11.0 K/uL   RBC 2.32 (L) 3.80 - 5.20 MIL/uL   Hemoglobin 8.4 (L) 12.0 - 16.0 g/dL   HCT 23.8 (L) 35.0 - 47.0 %   MCV 102.5 (H) 80.0 - 100.0 fL   MCH 36.3 (H) 26.0 - 34.0 pg   MCHC 35.4 32.0 - 36.0 g/dL   RDW 12.4 11.5 - 14.5 %   Platelets 151 150 - 440 K/uL  Basic metabolic panel     Status: Abnormal   Collection Time: 06/01/16  4:03 AM  Result Value Ref Range   Sodium 141 135 - 145 mmol/L   Potassium 2.9 (L) 3.5 - 5.1 mmol/L   Chloride 109 101 - 111 mmol/L   CO2 29 22 - 32 mmol/L   Glucose, Bld 120 (H) 65 - 99 mg/dL   BUN <5 (L) 6 - 20 mg/dL   Creatinine, Ser 0.34 (L) 0.44 - 1.00 mg/dL   Calcium 7.6 (L) 8.9 - 10.3 mg/dL   GFR calc non Af Amer >60 >60 mL/min   GFR calc Af Amer >60 >60 mL/min   Anion gap 3 (L) 5 - 15    US Carotid Bilateral  Result Date: 05/31/2016 CLINICAL DATA:   Syncope. EXAM: BILATERAL CAROTID DUPLEX ULTRASOUND TECHNIQUE: Pearline Cables scale imaging, color Doppler and duplex ultrasound were performed of bilateral carotid and vertebral arteries in the neck. COMPARISON:  None. FINDINGS: Criteria: Quantification of carotid stenosis is based on velocity parameters that correlate the residual internal carotid diameter with NASCET-based stenosis levels, using the diameter of the distal internal carotid lumen as the denominator for stenosis measurement. The following velocity measurements were obtained: RIGHT ICA:  149/68 cm/sec CCA:  123456 cm/sec SYSTOLIC ICA/CCA RATIO:  1.4 DIASTOLIC ICA/CCA RATIO:  2.0 ECA:  94 cm/sec LEFT ICA:  102/39 cm/sec CCA:  [id number] cm/sec SYSTOLIC ICA/CCA RATIO:  0.9 DIASTOLIC ICA/CCA RATIO:  1.0 ECA:  102 cm/sec RIGHT CAROTID ARTERY: Mild right carotid bifurcation atherosclerotic vascular plaque. RIGHT VERTEBRAL ARTERY:  Patent with antegrade flow. LEFT CAROTID ARTERY: Mild left common carotid, carotid bifurcation atherosclerotic vascular plaque. No flow limiting stenosis. LEFT VERTEBRAL ARTERY:  Patent antegrade flow . IMPRESSION: Mild bilateral carotid atherosclerotic vascular disease. No flow limiting stenosis. Degree of stenosis  less than 50% bilaterally. 2. Vertebral arteries are patent with antegrade flow. Electronically Signed   By: Marcello Moores  Register   On: 05/31/2016 13:55    Assessment/Plan: 2 Days Post-Op   Active Problems:   Hip fracture ([place])  Patient has hypotension and tachycardia. Her hemoglobin is 8.6 but is symptomatic.  I am going to order a unit of PRBC.  Recheck labs in the a.m. Continue physical therapy. Patient may weight-bear as tolerated in the left lower extremity. She is on Lovenox for DVT prophylaxis.    Thornton Park , MD 06/01/2016, 1:04 PM

## 2016-06-01 NOTE — Progress Notes (Signed)
[place] at [facility]                                                                                                                                                                                  Patient Demographics   Delon Ahr, is a 61 y.o. female, DOB - Feb 28, 1956, [id number]  Admit date - 05/29/2016   Admitting Physician [name], MD  Outpatient Primary MD for the patient is SOLES, MEREDITH KEY, MD   LOS - 3  Subjective: Patient still feeling dizzy and hypotension   Review of Systems:   CONSTITUTIONAL: No documented fever. No fatigue, weakness. No weight gain, no weight loss.  EYES: No blurry or double vision.  ENT: No tinnitus. No postnasal drip. No redness of the oropharynx.  RESPIRATORY: No cough, no wheeze, no hemoptysis. No dyspnea.  CARDIOVASCULAR: No chest pain. No orthopnea. No palpitations. No syncope.  GASTROINTESTINAL: No nausea, no vomiting or diarrhea. No abdominal pain. No melena or hematochezia.  GENITOURINARY: No dysuria or hematuria.  ENDOCRINE: No polyuria or nocturia. No heat or cold intolerance.  HEMATOLOGY: No anemia. No bruising. No bleeding.  INTEGUMENTARY: No rashes. No lesions.  MUSCULOSKELETAL: Left hip pain NEUROLOGIC: No numbness, tingling, or ataxia. No seizure-type activity.  PSYCHIATRIC: No anxiety. No insomnia. No ADD.    Vitals:   Vitals:   06/01/16 0802 06/01/16 1017 06/01/16 1224 06/01/16 1350  BP: 106/65 (!) 79/56  102/60  Pulse: (!) 104 (!) 118 99 (!) 108  Resp:    18  Temp: 98.8 F (37.1 C)   99.3 F (37.4 C)  TempSrc: Oral   Oral  SpO2: 99%  98% 99%  Weight:      Height:        Wt Readings from Last 3 Encounters:  05/29/16 146 lb 3.2 oz (66.3 kg)  04/05/16 142 lb (64.4 kg)  03/29/16 142 lb (64.4 kg)     Intake/Output Summary (Last 24 hours) at 06/01/16 1542 Last data filed at 06/01/16 0900  Gross per 24 hour  Intake              240 ml  Output              200 ml  Net                40 ml    Physical Exam:   GENERAL: Pleasant-appearing in no apparent distress.  HEAD, EYES, EARS, NOSE AND THROAT: Atraumatic, normocephalic. Extraocular muscles are intact. Pupils equal and reactive to light. Sclerae anicteric. No conjunctival injection. No oro-pharyngeal erythema.  NECK: Supple. There is no jugular venous distention. No bruits, no lymphadenopathy, no  thyromegaly.  HEART: Regular rate and rhythm,. No murmurs, no rubs, no clicks.  LUNGS: Clear to auscultation bilaterally. No rales or rhonchi. No wheezes.  ABDOMEN: Soft, flat, nontender, nondistended. Has good bowel sounds. No hepatosplenomegaly appreciated.  EXTREMITIES: No evidence of any cyanosis, clubbing, or peripheral edema.  +2 pedal and radial pulses bilaterally.  NEUROLOGIC: The patient is alert, awake, and oriented x3 with no focal motor or sensory deficits appreciated bilaterally.  SKIN: Moist and warm with no rashes appreciated.  Psych: Not anxious, depressed LN: No inguinal LN enlargement    Antibiotics   Anti-infectives    Start     Dose/Rate Route Frequency Ordered Stop   05/30/16 1600  ceFAZolin (ANCEF) IVPB 2g/100 mL premix     2 g 200 mL/hr over 30 Minutes Intravenous Every 6 hours 05/30/16 1304 05/30/16 2215   05/30/16 0930  ceFAZolin (ANCEF) IVPB 2g/100 mL premix  Status:  Discontinued     2 g 200 mL/hr over 30 Minutes Intravenous NOW 05/30/16 0916 05/30/16 1304   05/29/16 1000  valACYclovir (VALTREX) tablet 1,000 mg     1,000 mg Oral Daily 05/29/16 0637        Medications   Scheduled Meds: . sodium chloride   Intravenous Once  . acetaminophen  650 mg Oral Once  . atorvastatin  40 mg Oral q1800  . Chlorhexidine Gluconate Cloth  6 each Topical Daily  . docusate sodium  100 mg Oral BID  . enoxaparin (LOVENOX) injection  40 mg Subcutaneous Q24H  . feeding supplement (ENSURE ENLIVE)  237 mL Oral TID BM  . ferrous sulfate  325 mg Oral TID PC  . midodrine  5 mg Oral TID WC  .  multivitamin with minerals  1 tablet Oral Daily  . mupirocin ointment  1 application Nasal BID  . pantoprazole  40 mg Oral BID  . potassium chloride  40 mEq Oral BID  . QUEtiapine  25 mg Oral QHS  . senna  1 tablet Oral BID  . sertraline  50 mg Oral QHS  . valACYclovir  1,000 mg Oral Daily   Continuous Infusions: . sodium chloride 100 mL/hr at 05/31/16 1647   PRN Meds:.acetaminophen **OR** acetaminophen, alum & mag hydroxide-simeth, bisacodyl, clonazePAM, HYDROmorphone (DILAUDID) injection, magnesium citrate, menthol-cetylpyridinium **OR** phenol, methocarbamol **OR** methocarbamol (ROBAXIN)  IV, ondansetron **OR** ondansetron (ZOFRAN) IV, oxyCODONE, polyethylene glycol   Data Review:   Micro Results Recent Results (from the past 240 hour(s))  Surgical pcr screen     Status: Abnormal   Collection Time: 05/30/16  8:11 AM  Result Value Ref Range Status   MRSA, PCR NEGATIVE NEGATIVE Final   Staphylococcus aureus POSITIVE (A) NEGATIVE Final    Comment:        The Xpert SA Assay (FDA approved for NASAL specimens in patients over 32 years of age), is one component of a comprehensive surveillance program.  Test performance has been validated by [facility] for patients greater than or equal to 75 year old. It is not intended to diagnose infection nor to guide or monitor treatment.     Radiology Reports Ct Head Wo Contrast  Result Date: 05/29/2016 CLINICAL DATA:  Fall with hip fracture, syncopal episode EXAM: CT HEAD WITHOUT CONTRAST TECHNIQUE: Contiguous axial images were obtained from the base of the skull through the vertex without intravenous contrast. COMPARISON:  None. FINDINGS: Brain: No evidence for acute territorial infarction, intracranial hemorrhage or focal mass lesion. No midline shift. Ventricles are nonenlarged. Minimal atrophy. Vascular: No  hyperdense vessels.  No unexpected calcifications. Skull: Mastoid air cells are clear. No skull fracture is visualized.  Sinuses/Orbits: Minimal fluid within the left sphenoid sinus. Mild mucosal thickening in the ethmoid sinuses. No acute orbital abnormality. Other: None IMPRESSION: No CT evidence for acute intracranial abnormality. Electronically Signed   By: [name] M.D.   On: 05/29/2016 03:41   US Carotid Bilateral  Result Date: 05/31/2016 CLINICAL DATA:  Syncope. EXAM: BILATERAL CAROTID DUPLEX ULTRASOUND TECHNIQUE: Pearline Cables scale imaging, color Doppler and duplex ultrasound were performed of bilateral carotid and vertebral arteries in the neck. COMPARISON:  None. FINDINGS: Criteria: Quantification of carotid stenosis is based on velocity parameters that correlate the residual internal carotid diameter with NASCET-based stenosis levels, using the diameter of the distal internal carotid lumen as the denominator for stenosis measurement. The following velocity measurements were obtained: RIGHT ICA:  149/68 cm/sec CCA:  123456 cm/sec SYSTOLIC ICA/CCA RATIO:  1.4 DIASTOLIC ICA/CCA RATIO:  2.0 ECA:  94 cm/sec LEFT ICA:  102/39 cm/sec CCA:  123XX123 cm/sec SYSTOLIC ICA/CCA RATIO:  0.9 DIASTOLIC ICA/CCA RATIO:  1.0 ECA:  102 cm/sec RIGHT CAROTID ARTERY: Mild right carotid bifurcation atherosclerotic vascular plaque. RIGHT VERTEBRAL ARTERY:  Patent with antegrade flow. LEFT CAROTID ARTERY: Mild left common carotid, carotid bifurcation atherosclerotic vascular plaque. No flow limiting stenosis. LEFT VERTEBRAL ARTERY:  Patent antegrade flow . IMPRESSION: Mild bilateral carotid atherosclerotic vascular disease. No flow limiting stenosis. Degree of stenosis less than 50% bilaterally. 2. Vertebral arteries are patent with antegrade flow. Electronically Signed   By: Marcello Moores  Register   On: 05/31/2016 13:55   Dg Chest 1 View  Result Date: 05/29/2016 CLINICAL DATA:  Preop, fall with hip fracture EXAM: CHEST 1 VIEW COMPARISON:  06/23/2010 FINDINGS: There is no focal pulmonary infiltrate, consolidation or effusion. Cardiomediastinal silhouette  is nonenlarged. There is no pneumothorax. Stimulator leads are visualized over the spine. IMPRESSION: No radiographic evidence for acute cardiopulmonary abnormality Electronically Signed   By: [name] M.D.   On: 05/29/2016 03:37   Dg C-arm 61-120 Min  Result Date: 05/30/2016 CLINICAL DATA:  Intramedullary nail EXAM: LEFT FEMUR 2 VIEWS; DG C-ARM 61-120 MIN COMPARISON:  03/29/2017 FINDINGS: Internal fixation across the left intertrochanteric femoral fracture. Near anatomic alignment. No hardware complicating feature. IMPRESSION: Internal fixation.  No complicating feature. Electronically Signed   By: Rolm Baptise M.D.   On: 05/30/2016 11:24   Dg Hip Unilat W Or Wo Pelvis 2-3 Views Left  Result Date: 05/29/2016 CLINICAL DATA:  Fall with left hip pain EXAM: DG HIP (WITH OR WITHOUT PELVIS) 2-3V LEFT COMPARISON:  10/18/2015 FINDINGS: Left lower quadrant generator again noted. The right femoral head appears normally positioned and there are degenerative changes of the right hip as before. Pubic symphysis appears intact. Mildly comminuted intertrochanteric fracture of the proximal left femur are with mild varus angulation. Left femoral head projects in joint. IMPRESSION: Acute mildly angulated left intertrochanteric fracture. Electronically Signed   By: [name] M.D.   On: 05/29/2016 03:35   Dg Femur Min 2 Views Left  Result Date: 05/30/2016 CLINICAL DATA:  Status post intertrochanteric hip fracture. EXAM: LEFT FEMUR 2 VIEWS COMPARISON:  Multiple priors. FINDINGS: Intramedullary rod and screw placement across a mildly angulated LEFT intertrochanteric fracture. Improved position and alignment. IMPRESSION: Satisfactory ORIF. Electronically Signed   By: Staci Righter M.D.   On: 05/30/2016 12:13   Dg Femur Min 2 Views Left  Result Date: 05/30/2016 CLINICAL DATA:  Intramedullary nail EXAM: LEFT FEMUR 2 VIEWS;  DG C-ARM 61-120 MIN COMPARISON:  03/29/2017 FINDINGS: Internal fixation across the left  intertrochanteric femoral fracture. Near anatomic alignment. No hardware complicating feature. IMPRESSION: Internal fixation.  No complicating feature. Electronically Signed   By: Rolm Baptise M.D.   On: 05/30/2016 11:24     CBC  Recent Labs Lab 05/29/16 0304 05/31/16 0434 06/01/16 0403  WBC 9.3 11.8* 8.7  HGB 12.9 9.4* 8.4*  HCT 36.1 25.9* 23.8*  PLT 226 158 151  MCV 101.3* 101.7* 102.5*  MCH 36.2* 36.9* 36.3*  MCHC 35.8 36.3* 35.4  RDW 13.0 12.6 12.4    Chemistries   Recent Labs Lab 05/29/16 0304 05/31/16 0434 06/01/16 0403  NA 140 140 141  K 3.3* 3.5 2.9*  CL 108 109 109  CO2 24 26 29   GLUCOSE 158* 132* 120*  BUN 6 <5* <5*  CREATININE 0.62 0.37* 0.34*  CALCIUM 8.6* 8.1* 7.6*   ------------------------------------------------------------------------------------------------------------------ estimated creatinine clearance is 65.2 mL/min (by C-G formula based on SCr of 0.34 mg/dL (L)). ------------------------------------------------------------------------------------------------------------------ No results for input(s): HGBA1C in the last 72 hours. ------------------------------------------------------------------------------------------------------------------ No results for input(s): CHOL, HDL, LDLCALC, TRIG, CHOLHDL, LDLDIRECT in the last 72 hours. ------------------------------------------------------------------------------------------------------------------ No results for input(s): TSH, T4TOTAL, T3FREE, THYROIDAB in the last 72 hours.  Invalid input(s): FREET3 ------------------------------------------------------------------------------------------------------------------ No results for input(s): VITAMINB12, FOLATE, FERRITIN, TIBC, IRON, RETICCTPCT in the last 72 hours.  Coagulation profile No results for input(s): INR, PROTIME in the last 168 hours.  No results for input(s): DDIMER in the last 72 hours.  Cardiac Enzymes  Recent Labs Lab  05/29/16 0304  TROPONINI <0.03   ------------------------------------------------------------------------------------------------------------------ Invalid input(s): POCBNP    Assessment & Plan   Patient is a 61 year old admitted with a syncopal episode with a hip fracture  1.hip fx s/p repair supportive care   2.  Syncope with hypotention - continue IV fluids, I will start patient on  midrodine  3.  Hyperlipidemia unspecified continue atorvastatin    4. Essential hypertension bp low stop prazosin  5. GERD we'll continue Protonix  6. Miscellaneous Lovenox       Code Status Orders        Start     Ordered   05/29/16 [id number]  Full code  Continuous     05/29/16 0637    Code Status History    Date Active Date Inactive Code Status Order ID Comments User Context   This patient has a current code status but no historical code status.           Consults Orthopedics   DVT Prophylaxis  Lovenox   Lab Results  Component Value Date   PLT 151 06/01/2016     Time Spent in minutes 29min Greater than 50% of time spent in care coordination and counseling patient regarding the condition and plan of care.   Dustin Flock M.D on 06/01/2016 at 3:42 PM  Between 7am to 6pm - Pager - [phone]  After 6pm go to [email or web address] - password [email or web address] [place] [facility]   Office  [phone]

## 2016-06-01 NOTE — Progress Notes (Signed)
Physical Therapy Treatment Patient Details Name: Wanda [name] MRN: [id number] DOB: 1955/10/05 Today's Date: 06/01/2016    History of Present Illness Pt is a 61 y/o F s/p syncompal episode and fall, now s/p L hip IM nail.  Pt's PMH includes back surgery, shoulder surgery, spinal cord stimulator.    PT Comments    Pt presents with deficits in strength, transfers, mobility, gait, balance, and activity tolerance.  Pt's BP (mmHg) in supine 109/55, sitting 94/60 with minimal light headedness, standing 79/56 with moderate light headedness and dizziness.  Pt returned to sitting and amb deferred this session secondary to symptomatic orthostatic hypotension, nsg notified.  Pt remains limited with bed mobility requiring mod A for all tasks secondary to LLE pain and weakness.  Pt required min A with sit to stand transfer and close CGA with RW during standing for BP assessment.  Pt will benefit from PT services to address above deficits for decreased caregiver assistance upon discharge.    Follow Up Recommendations  Home health PT; difficult to assess appropriate d/c setting secondary to pt currently limited by orthostatic hypotension.       Equipment Recommendations  Rolling walker with 5" wheels    Recommendations for Other Services       Precautions / Restrictions Precautions Precautions: Fall Precaution Comments: monitor BP Restrictions Weight Bearing Restrictions: Yes LLE Weight Bearing: Weight bearing as tolerated    Mobility  Bed Mobility Overal bed mobility: Needs Assistance Bed Mobility: Supine to Sit;Sit to Supine     Supine to sit: Mod assist Sit to supine: Mod assist   General bed mobility comments: Bed mobility primarily limited by LLE pain and weakness  Transfers Overall transfer level: Needs assistance Equipment used: Rolling walker (2 wheeled) Transfers: Sit to/from Stand Sit to Stand: Min assist         General transfer comment: Pt able to stand for  orthostatic BP check per above but returned to sitting once BP complete secondary to light headedness/dizziness  Ambulation/Gait             General Gait Details: unable to assess   Stairs            Wheelchair Mobility    Modified Rankin (Stroke Patients Only)       Balance Overall balance assessment: Needs assistance Sitting-balance support: No upper extremity supported;Feet supported Sitting balance-Leahy Scale: Fair     Standing balance support: Bilateral upper extremity supported Standing balance-Leahy Scale: Fair Standing balance comment: Close guard secondary to c/o dizziness                    Cognition Arousal/Alertness: Awake/alert Behavior During Therapy: WFL for tasks assessed/performed Overall Cognitive Status: Within Functional Limits for tasks assessed                      Exercises Total Joint Exercises Ankle Circles/Pumps: AROM;Both;10 reps;15 reps Quad Sets: AROM;Both;10 reps;15 reps Gluteal Sets: AROM;Both;10 reps Heel Slides: AAROM;Left;5 reps Hip ABduction/ADduction: AAROM;Left;10 reps Straight Leg Raises: AAROM;Left;10 reps Long Arc Quad: AROM;Left;5 reps;10 reps Knee Flexion: AROM;Left;5 reps;10 reps    General Comments        Pertinent Vitals/Pain Pain Assessment: 0-10 Pain Score: 7  Pain Location: left hip Pain Descriptors / Indicators: Constant;Guarding Pain Intervention(s): Premedicated before session;Monitored during session    Home Living                      Prior Function  PT Goals (current goals can now be found in the care plan section) Progress towards PT goals: Not progressing toward goals - comment (Limited by orthostatic hypotension with symptoms)    Frequency    BID      PT Plan Current plan remains appropriate    Co-evaluation             End of Session Equipment Utilized During Treatment: Gait belt;Oxygen Activity Tolerance: Treatment limited secondary  to medical complications (Comment) (Orthostatic hypotension with c/o dizziness/light headedness in standing) Patient left: in bed;with bed alarm set;with SCD's reapplied;with call bell/phone within reach;with nursing/sitter in room     Time: [id number] PT Time Calculation (min) (ACUTE ONLY): 35 min  Charges:  $Therapeutic Exercise: 8-22 mins $Therapeutic Activity: 8-22 mins                    G Codes:      DRoyetta Asal PT, DPT 06/01/16, 12:40 PM

## 2016-06-01 NOTE — Progress Notes (Signed)
Physical Therapy Treatment Patient Details Name: Wanda [name] MRN: [id number] DOB: Sep 02, 1955 Today's Date: 06/01/2016    History of Present Illness Pt is a 61 y/o F s/p syncompal episode and fall, now s/p L hip IM nail.  Pt's PMH includes back surgery, shoulder surgery, spinal cord stimulator.    PT Comments    Pt continues to present with deficits in strength, transfers, mobility, gait, balance, and activity tolerance.  Pt on 3LO2/min during session with SpO2 >/= 96% throughout session.  Pt required mod A for bed mobility tasks and min A with transfers with mod verbal cues for sequencing for each.  Pt presented with improved BPs during this PM session as follows (mmHg): supine: 117/69, sitting: 99/67, standing: 105/72.  Pt also reported only minimal symptoms of dizziness with standing this session and was able to take several small steps with RW and CGA forwards/backwards at EOB with therapeutic rest breaks between attempts and mod verbal cues for sequencing to minimize LLE pain with gait.  Pt will benefit from PT services to address above deficits for decreased caregiver assistance upon discharge.    Follow Up Recommendations  SNF     Equipment Recommendations  Rolling walker with 5" wheels    Recommendations for Other Services       Precautions / Restrictions Precautions Precautions: Fall Precaution Comments: monitor BP Restrictions Weight Bearing Restrictions: Yes LLE Weight Bearing: Weight bearing as tolerated    Mobility  Bed Mobility Overal bed mobility: Needs Assistance Bed Mobility: Supine to Sit;Sit to Supine     Supine to sit: Mod assist Sit to supine: Mod assist   General bed mobility comments: Bed mobility primarily limited by LLE pain and weakness  Transfers Overall transfer level: Needs assistance Equipment used: Rolling walker (2 wheeled) Transfers: Sit to/from Stand Sit to Stand: Min assist         General transfer comment: Improved BP in  standing this session at 105/72 mmHg VS 79/56 mmHg during AM session with decreased c/o symptoms  Ambulation/Gait Ambulation/Gait assistance: Min guard Ambulation Distance (Feet): 3 Feet Assistive device: Rolling walker (2 wheeled) Gait Pattern/deviations: Step-to pattern;Antalgic;Trunk flexed     General Gait Details: Pt able to take several very small steps forward/backward at EOB but limited by fatigue and L hip pain but no significant dizziness or light headedness   Stairs            Wheelchair Mobility    Modified Rankin (Stroke Patients Only)       Balance Overall balance assessment: Needs assistance Sitting-balance support: No upper extremity supported;Feet supported Sitting balance-Leahy Scale: Fair Sitting balance - Comments: Close min guard due to c/o dizziness   Standing balance support: Bilateral upper extremity supported Standing balance-Leahy Scale: Fair                      Cognition Arousal/Alertness: Awake/alert Behavior During Therapy: WFL for tasks assessed/performed Overall Cognitive Status: Within Functional Limits for tasks assessed                      Exercises Total Joint Exercises Ankle Circles/Pumps: Strengthening;Both;10 reps;15 reps Quad Sets: AROM;Both;10 reps Gluteal Sets: AROM;10 reps Towel Squeeze: AROM;Both;10 reps Heel Slides: AAROM;Left;5 reps;10 reps Hip ABduction/ADduction: AAROM;Left;5 reps;10 reps Straight Leg Raises: AAROM;Left;5 reps;10 reps Long Arc Quad: AROM;Left;10 reps;15 reps Knee Flexion: AROM;Left;10 reps;15 reps    General Comments        Pertinent Vitals/Pain Pain Assessment: 0-10 Pain Score:  7  Pain Location: left hip Pain Descriptors / Indicators: Guarding;Aching Pain Intervention(s): Premedicated before session;Monitored during session;Limited activity within patient's tolerance    Home Living                      Prior Function            PT Goals (current goals can  now be found in the care plan section) Progress towards PT goals: Progressing toward goals    Frequency    BID      PT Plan Current plan remains appropriate    Co-evaluation             End of Session Equipment Utilized During Treatment: Gait belt;Oxygen Activity Tolerance: Patient limited by fatigue;Patient limited by pain Patient left: in bed;with bed alarm set;with SCD's reapplied;with call bell/phone within reach     Time: 1430-1509 PT Time Calculation (min) (ACUTE ONLY): 39 min  Charges:  $Gait Training: 8-22 mins $Therapeutic Exercise: 8-22 mins $Therapeutic Activity: 8-22 mins                    G Codes:      DRoyetta Asal PT, DPT 06/01/16, 5:00 PM

## 2016-06-01 NOTE — Progress Notes (Signed)
Per [facility] admissions coordinator at [facility] authorization has been received. Per MD patient will likely be ready for D/C tomorrow. Plan is for patient to D/C to [facility] when stable. Patient is aware of above.   [employer], LCSW [phone]

## 2016-06-02 ENCOUNTER — Inpatient Hospital Stay: Payer: Medicare PPO

## 2016-06-02 LAB — CBC
HCT: 30.3 % — ABNORMAL LOW (ref 35.0–47.0)
HEMOGLOBIN: 10.9 g/dL — AB (ref 12.0–16.0)
MCH: 35.5 pg — AB (ref 26.0–34.0)
MCHC: 35.9 g/dL (ref 32.0–36.0)
MCV: 98.9 fL (ref 80.0–100.0)
Platelets: 196 10*3/uL (ref 150–440)
RBC: 3.06 MIL/uL — AB (ref 3.80–5.20)
RDW: 14.5 % (ref 11.5–14.5)
WBC: 8.2 10*3/uL (ref 3.6–11.0)

## 2016-06-02 LAB — TYPE AND SCREEN
ABO/RH(D): O POS
Antibody Screen: NEGATIVE
UNIT DIVISION: 0

## 2016-06-02 LAB — BASIC METABOLIC PANEL
Anion gap: 4 — ABNORMAL LOW (ref 5–15)
BUN: 6 mg/dL (ref 6–20)
CHLORIDE: 111 mmol/L (ref 101–111)
CO2: 29 mmol/L (ref 22–32)
CREATININE: 0.43 mg/dL — AB (ref 0.44–1.00)
Calcium: 8 mg/dL — ABNORMAL LOW (ref 8.9–10.3)
GFR calc Af Amer: >60 mL/min (ref >60)
GFR calc non Af Amer: >60 mL/min (ref >60)
Glucose, Bld: 118 mg/dL — ABNORMAL HIGH (ref 65–99)
POTASSIUM: 3.9 mmol/L (ref 3.5–5.1)
Sodium: 144 mmol/L (ref 135–145)

## 2016-06-02 MED ORDER — IOPAMIDOL (ISOVUE-370) INJECTION 76%
75.0000 mL | Freq: Once | INTRAVENOUS | Status: AC | PRN
  Administered 2016-06-02: 75 mL via INTRAVENOUS

## 2016-06-02 MED ORDER — MIDODRINE HCL 5 MG PO TABS
10.0000 mg | ORAL_TABLET | Freq: Three times a day (TID) | ORAL | Status: DC
Start: 2016-06-02 — End: 2016-06-04
  Administered 2016-06-02 – 2016-06-04 (×4): 10 mg via ORAL
  Filled 2016-06-02 (×6): qty 2

## 2016-06-02 MED ORDER — NADOLOL 20 MG PO TABS
20.0000 mg | ORAL_TABLET | Freq: Every day | ORAL | Status: DC
  Administered 2016-06-02: 20 mg via ORAL
  Filled 2016-06-02: qty 1

## 2016-06-02 MED ORDER — ENOXAPARIN SODIUM 40 MG/0.4ML ~~LOC~~ SOLN: 40.0000 mg | SUBCUTANEOUS | Status: DC

## 2016-06-02 MED ORDER — OXYCODONE HCL 5 MG PO TABS: 5.0000 mg | ORAL_TABLET | ORAL | 0 refills | Status: DC | PRN

## 2016-06-02 MED ORDER — NADOLOL 20 MG PO TABS: 20.0000 mg | ORAL_TABLET | Freq: Every day | ORAL | Status: DC

## 2016-06-02 MED ORDER — MIDODRINE HCL 5 MG PO TABS: 5.0000 mg | ORAL_TABLET | Freq: Three times a day (TID) | ORAL | Status: DC

## 2016-06-02 NOTE — Progress Notes (Signed)
Patient is medically stable for D/C to [facility] today. Per [facility] admissions coordinator at [facility] patient will go to room 503 and [facility] authorization has been received. RN will call report and arrange EMS for transport. Clinical Education officer, museum (CSW) sent D/C orders to [employer] via [place]. Patient is aware of above. CSW contacted patient's son Liliane Channel and made him aware of above. Please reconsult if future social work needs arise. CSW signing off.   [employer], LCSW [phone]

## 2016-06-02 NOTE — Progress Notes (Signed)
Subjective:  Postoperative day #3 status post left intramedullary rod fixation for intertrochanteric hip fracture. Patient reports pain as mild.  Patient received 1 unit of packed red blood cells yesterday. Her orthostatic blood pressures are improved today. Patient still with tachycardia.  Objective:   VITALS:   Vitals:   06/01/16 2009 06/01/16 2037 06/02/16 0340 06/02/16 0823  BP: 102/68 107/64 105/67 109/69  Pulse: (!) 108 (!) 102 (!) 114 (!) 116  Resp: 18 18 18 16   Temp: 98 F (36.7 C) 98.4 F (36.9 C) 98.1 F (36.7 C) 98.2 F (36.8 C)  TempSrc: Oral Oral Oral Oral  SpO2: 96% 98% 94% 94%  Weight:      Height:        PHYSICAL EXAM:  Left lower extremity:  Patient has modest amount of serous and was drainage on her bandage. She has mild thigh swelling but thigh compartments are soft and compressible. She has intact sensation light touch in palpable pedal pulses. She has intact motor function.  LABS  Results for orders placed or performed during the hospital encounter of 05/29/16 (from the past 24 hour(s))  Prepare RBC     Status: None   Collection Time: 06/01/16  1:37 PM  Result Value Ref Range   Order Confirmation ORDER PROCESSED BY BLOOD BANK   Type and screen     Status: None   Collection Time: 06/01/16  1:45 PM  Result Value Ref Range   ABO/RH(D) O POS    Antibody Screen NEG    Sample Expiration 06/04/2016    Unit Number [id number]    Blood Component Type RED CELLS,LR    Unit division 00    Status of Unit ISSUED,FINAL    Transfusion Status OK TO TRANSFUSE    Crossmatch Result Compatible   CBC     Status: Abnormal   Collection Time: 06/02/16  3:59 AM  Result Value Ref Range   WBC 8.2 3.6 - 11.0 K/uL   RBC 3.06 (L) 3.80 - 5.20 MIL/uL   Hemoglobin 10.9 (L) 12.0 - 16.0 g/dL   HCT 30.3 (L) 35.0 - 47.0 %   MCV 98.9 80.0 - 100.0 fL   MCH 35.5 (H) 26.0 - 34.0 pg   MCHC 35.9 32.0 - 36.0 g/dL   RDW 14.5 11.5 - 14.5 %   Platelets 196 150 - 440 K/uL  Basic  metabolic panel     Status: Abnormal   Collection Time: 06/02/16  3:59 AM  Result Value Ref Range   Sodium 144 135 - 145 mmol/L   Potassium 3.9 3.5 - 5.1 mmol/L   Chloride 111 101 - 111 mmol/L   CO2 29 22 - 32 mmol/L   Glucose, Bld 118 (H) 65 - 99 mg/dL   BUN 6 6 - 20 mg/dL   Creatinine, Ser 0.43 (L) 0.44 - 1.00 mg/dL   Calcium 8.0 (L) 8.9 - 10.3 mg/dL   GFR calc non Af Amer >60 >60 mL/min   GFR calc Af Amer >60 >60 mL/min   Anion gap 4 (L) 5 - 15    US Carotid Bilateral  Result Date: 05/31/2016 CLINICAL DATA:  Syncope. EXAM: BILATERAL CAROTID DUPLEX ULTRASOUND TECHNIQUE: Pearline Cables scale imaging, color Doppler and duplex ultrasound were performed of bilateral carotid and vertebral arteries in the neck. COMPARISON:  None. FINDINGS: Criteria: Quantification of carotid stenosis is based on velocity parameters that correlate the residual internal carotid diameter with NASCET-based stenosis levels, using the diameter of the distal internal carotid lumen as the  denominator for stenosis measurement. The following velocity measurements were obtained: RIGHT ICA:  149/68 cm/sec CCA:  123456 cm/sec SYSTOLIC ICA/CCA RATIO:  1.4 DIASTOLIC ICA/CCA RATIO:  2.0 ECA:  94 cm/sec LEFT ICA:  102/39 cm/sec CCA:  [id number] cm/sec SYSTOLIC ICA/CCA RATIO:  0.9 DIASTOLIC ICA/CCA RATIO:  1.0 ECA:  102 cm/sec RIGHT CAROTID ARTERY: Mild right carotid bifurcation atherosclerotic vascular plaque. RIGHT VERTEBRAL ARTERY:  Patent with antegrade flow. LEFT CAROTID ARTERY: Mild left common carotid, carotid bifurcation atherosclerotic vascular plaque. No flow limiting stenosis. LEFT VERTEBRAL ARTERY:  Patent antegrade flow . IMPRESSION: Mild bilateral carotid atherosclerotic vascular disease. No flow limiting stenosis. Degree of stenosis less than 50% bilaterally. 2. Vertebral arteries are patent with antegrade flow. Electronically Signed   By: Marcello Moores  Register   On: 05/31/2016 13:55    Assessment/Plan: 3 Days Post-Op   Active  Problems:   Hip fracture [facility])  Patient is doing well from an orthopedic standpoint. Her dressing will be changed today. Patient may be discharged to skilled nursing facility when cleared by medicine. She is weightbearing as tolerated on the left lower extremity. She will follow up with me in 10-14 days in my office. Patient should continue physical therapy at [facility] for lower extremity strengthening, hip range of motion and gait training. She should remain on Lovenox 40 mg daily 4 weeks for DVT prophylaxis postop.    Thornton Park , MD 06/02/2016, 12:40 PM

## 2016-06-02 NOTE — Progress Notes (Signed)
Physical Therapy Treatment Patient Details Name: Wanda [name] MRN: [id number] DOB: 1955/08/03 Today's Date: 06/02/2016    History of Present Illness Pt is a 61 y/o F s/p syncompal episode and fall, now s/p L hip IM nail.  Pt's PMH includes back surgery, shoulder surgery, spinal cord stimulator.    PT Comments    Pt only reporting mild dizziness when ambulating from bedside commode to chair this morning using RW.  Pt's BP during session was: supine in bed 111/68; sitting edge of bed 112/77; after toileting and walking to recliner 103/25 in sitting (pt moving arm during this so took BP a few minutes later and BP was 115/68); (nursing notified regarding all of above).  Will continue to progress pt with strengthening and progressive ambulation per pt tolerance.    Follow Up Recommendations  SNF     Equipment Recommendations  Rolling walker with 5" wheels    Recommendations for Other Services       Precautions / Restrictions Precautions Precautions: Fall Precaution Comments: monitor BP Restrictions Weight Bearing Restrictions: Yes LLE Weight Bearing: Weight bearing as tolerated    Mobility  Bed Mobility Overal bed mobility: Needs Assistance Bed Mobility: Supine to Sit     Supine to sit: Min assist;HOB elevated;Mod assist     General bed mobility comments: assist for L LE and trunk; increased time to perform; vc's for technique  Transfers Overall transfer level: Needs assistance Equipment used: Rolling walker (2 wheeled) Transfers: Sit to/from [facility] Sit to Stand: Min assist Stand pivot transfers: Min assist (bed to bedside commode)       General transfer comment: sit to stand from bed and bedside commode; vc's for hand placement; increased effort noted by patient  Ambulation/Gait Ambulation/Gait assistance: Min guard Ambulation Distance (Feet): 5 Feet Assistive device: Rolling walker (2 wheeled) Gait Pattern/deviations: Step-to  pattern;Antalgic;Trunk flexed Gait velocity: decreased   General Gait Details: pt ambulated from bedside commode to recliner chair; pt reporting mild dizziness with activity   Stairs            Wheelchair Mobility    Modified Rankin (Stroke Patients Only)       Balance Overall balance assessment: Needs assistance Sitting-balance support: Bilateral upper extremity supported;Feet supported Sitting balance-Leahy Scale: Good     Standing balance support: Bilateral upper extremity supported (on RW) Standing balance-Leahy Scale: Fair Standing balance comment: static standing                    Cognition Arousal/Alertness: Awake/alert Behavior During Therapy: WFL for tasks assessed/performed Overall Cognitive Status: Within Functional Limits for tasks assessed                      Exercises      General Comments  Pt agreeable to PT session and requesting to toilet.      Pertinent Vitals/Pain Pain Assessment: 0-10 Pain Score: 7  (5/10 beginning of session; 7/10 end of session) Pain Location: left hip Pain Descriptors / Indicators: Guarding;Aching;Sore Pain Intervention(s): Limited activity within patient's tolerance;Monitored during session;Repositioned;Patient requesting pain meds-RN notified (pt initially requesting to try PT without pain meds but was requesting pain meds end of session; nursing notified)    Home Living                      Prior Function            PT Goals (current goals can now be found in  the care plan section) Acute Rehab PT Goals Patient Stated Goal: Patient wants to be able to return home and be independent PT Goal Formulation: With patient Time For Goal Achievement: 06/09/16 Potential to Achieve Goals: Good Progress towards PT goals: Progressing toward goals    Frequency    BID      PT Plan Current plan remains appropriate    Co-evaluation             End of Session Equipment Utilized During  Treatment: Gait belt Activity Tolerance: Patient limited by fatigue;Patient limited by pain Patient left: in chair;with call bell/phone within reach;with chair alarm set;with SCD's reapplied (B heels elevated via pillow)     Time: [id number] PT Time Calculation (min) (ACUTE ONLY): 38 min  Charges:  $Gait Training: 8-22 mins $Therapeutic Exercise: 8-22 mins $Therapeutic Activity: 8-22 mins                    G CodesLeitha Bleak June 23, 2016, 9:40 AM Leitha Bleak, [place]

## 2016-06-02 NOTE — Clinical Social Work Placement (Signed)
   CLINICAL SOCIAL WORK PLACEMENT  NOTE  Date:  06/02/2016  Patient Details  Name: Wanda [name] MRN: [id number] Date of Birth: 09/02/1955  Clinical Social Work is seeking post-discharge placement for this patient at the [facility] level of care (*CSW will initial, date and re-position this form in  chart as items are completed):  Yes   Patient/family provided with [facility] Department's list of facilities offering this level of care within the geographic area requested by the patient (or if unable, by the patient's family).  Yes   Patient/family informed of their freedom to choose among providers that offer the needed level of care, that participate in Medicare, Medicaid or managed care program needed by the patient, have an available bed and are willing to accept the patient.  Yes   Patient/family informed of [facility]'s ownership interest in [facility] and [facility], as well as of the fact that they are under no obligation to receive care at these facilities.  PASRR submitted to EDS on 05/29/16     PASRR number received on 05/29/16     Existing PASRR number confirmed on       FL2 transmitted to all facilities in geographic area requested by pt/family on 05/29/16     FL2 transmitted to all facilities within larger geographic area on       Patient informed that his/her managed care company has contracts with or will negotiate with certain facilities, including the following:        Yes   Patient/family informed of bed offers received.  Patient [facility] bed at  [facility] )     Physician recommends and patient chooses bed at      Patient to be transferred to  [facility] ) on 06/02/16.  Patient to be transferred to facility by  [facility] )     Patient family notified on 06/02/16 of transfer.  Name of family member notified:   (Patient's son Liliane Channel is aware of D/C today. )     PHYSICIAN        Additional Comment:    _______________________________________________ Keondre Markson, Veronia Beets, LCSW 06/02/2016, 1:34 PM

## 2016-06-02 NOTE — Progress Notes (Signed)
Patient BP 90/61 MD aware ordered increase in Midodrine.   Deri Fuelling, RN

## 2016-06-02 NOTE — Progress Notes (Signed)
Per MD D/C has been cancelled. Per [facility] admissions coordinator at [facility] patient can come to [place] over the weekend if stable.  [employer], LCSW [phone]

## 2016-06-02 NOTE — Discharge Instructions (Addendum)
[facility] [place]:  Regular diet  DISCHARGE CONDITION:  Stable  ACTIVITY:  Activity as tolerated  OXYGEN:  Home Oxygen: No.   Oxygen Delivery: room air  DISCHARGE LOCATION:  snf   ADDITIONAL DISCHARGE INSTRUCTION: staple care and wound care per orhtopedics   If you experience worsening of your admission symptoms, develop shortness of breath, life threatening emergency, suicidal or homicidal thoughts you must seek medical attention immediately by calling 911 or calling your MD immediately  if symptoms less severe.  You Must read complete instructions/literature along with all the possible adverse reactions/side effects for all the Medicines you take and that have been prescribed to you. Take any new Medicines after you have completely understood and accpet all the possible adverse reactions/side effects.   Please note  You were cared for by a hospitalist during your hospital stay. If you have any questions about your discharge medications or the care you received while you were in the hospital after you are discharged, you can call the unit and asked to speak with the hospitalist on call if the hospitalist that took care of you is not available. Once you are discharged, your primary care physician will handle any further medical issues. Please note that NO REFILLS for any discharge medications will be authorized once you are discharged, as it is imperative that you return to your primary care physician (or establish a relationship with a primary care physician if you do not have one) for your aftercare needs so that they can reassess your need for medications and monitor your lab values.

## 2016-06-02 NOTE — Progress Notes (Signed)
OT Cancellation Note  Patient Details Name: KATHALINA ARVAYO MRN: [id number] DOB: 12-30-1955   Cancelled Treatment:    Reason Eval/Treat Not Completed: Patient at procedure or test/ unavailable  Harrel Carina, MS, OTR/L 06/02/2016, 3:21 PM

## 2016-06-02 NOTE — Discharge Summary (Signed)
[facility] [place] at [facility], [name] y.o., DOB [birth year], MRN [id number]. 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 She was noted to have a left intertrochanteric hip fracture. Patient underwent a workup for her syncope including carotid Dopplers as well as echocardiogram of the heart. Patient was seen by orthopedics and underwent hip surgery. Patient had hypotension postop and received IV fluids and blood transfusion. She was also started on Midrodrine. Patient's blood pressure medications were held. She has been asymptomatic with the hypotension. Patient is in need of rehabilitation which is currently being arranged.           Consults  orthopedic surgery  Significant Tests:  See full reports for all details    Ct Head Wo Contrast  Result Date: 05/29/2016 CLINICAL DATA:  Fall with hip fracture, syncopal episode EXAM: CT HEAD WITHOUT CONTRAST TECHNIQUE: Contiguous axial images were obtained from the base of the skull through the vertex without intravenous contrast. COMPARISON:  None. FINDINGS: Brain: No evidence for acute territorial infarction, intracranial hemorrhage or focal mass lesion. No midline shift. Ventricles are nonenlarged. Minimal atrophy. Vascular: No hyperdense vessels.  No unexpected calcifications. Skull: Mastoid air cells are clear. No skull fracture is visualized. Sinuses/Orbits: Minimal fluid within the left sphenoid sinus. Mild mucosal thickening  in the ethmoid sinuses. No acute orbital abnormality. Other: None IMPRESSION: No CT evidence for acute intracranial abnormality. Electronically Signed   By: [name] M.D.   On: 05/29/2016 03:41   US Carotid Bilateral  Result Date: 05/31/2016 CLINICAL DATA:  Syncope. EXAM: BILATERAL CAROTID DUPLEX ULTRASOUND TECHNIQUE: Pearline Cables scale imaging, color Doppler and duplex ultrasound were performed of bilateral carotid and vertebral arteries in the neck. COMPARISON:  None. FINDINGS: Criteria: Quantification of carotid stenosis is based on velocity parameters that correlate the residual internal carotid diameter with NASCET-based stenosis levels, using the diameter of the distal internal carotid lumen as the denominator for stenosis measurement. The following velocity measurements were obtained: RIGHT ICA:  149/68 cm/sec CCA:  123456 cm/sec SYSTOLIC ICA/CCA RATIO:  1.4 DIASTOLIC ICA/CCA RATIO:  2.0 ECA:  94 cm/sec LEFT ICA:  102/39 cm/sec CCA:  123XX123 cm/sec SYSTOLIC ICA/CCA RATIO:  0.9 DIASTOLIC ICA/CCA RATIO:  1.0 ECA:  102 cm/sec RIGHT CAROTID ARTERY: Mild right carotid bifurcation atherosclerotic vascular plaque. RIGHT VERTEBRAL ARTERY:  Patent with antegrade flow. LEFT CAROTID ARTERY: Mild left common carotid, carotid bifurcation atherosclerotic vascular plaque. No flow limiting stenosis. LEFT VERTEBRAL ARTERY:  Patent antegrade flow . IMPRESSION: Mild bilateral carotid atherosclerotic vascular disease. No flow limiting stenosis. Degree of stenosis less than 50% bilaterally. 2. Vertebral arteries are patent with antegrade flow. Electronically Signed   By: Marcello Moores  Register   On: 05/31/2016 13:55   Dg Chest 1 View  Result Date: 05/29/2016 CLINICAL DATA:  Preop, fall with hip fracture EXAM: CHEST 1 VIEW COMPARISON:  06/23/2010 FINDINGS: There is no focal pulmonary infiltrate, consolidation or effusion. Cardiomediastinal silhouette is nonenlarged. There is no pneumothorax. Stimulator leads  are visualized over the  spine. IMPRESSION: No radiographic evidence for acute cardiopulmonary abnormality Electronically Signed   By: [name] M.D.   On: 05/29/2016 03:37   Dg C-arm 61-120 Min  Result Date: 05/30/2016 CLINICAL DATA:  Intramedullary nail EXAM: LEFT FEMUR 2 VIEWS; DG C-ARM 61-120 MIN COMPARISON:  03/29/2017 FINDINGS: Internal fixation across the left intertrochanteric femoral fracture. Near anatomic alignment. No hardware complicating feature. IMPRESSION: Internal fixation.  No complicating feature. Electronically Signed   By: Rolm Baptise M.D.   On: 05/30/2016 11:24   Dg Hip Unilat W Or Wo Pelvis 2-3 Views Left  Result Date: 05/29/2016 CLINICAL DATA:  Fall with left hip pain EXAM: DG HIP (WITH OR WITHOUT PELVIS) 2-3V LEFT COMPARISON:  10/18/2015 FINDINGS: Left lower quadrant generator again noted. The right femoral head appears normally positioned and there are degenerative changes of the right hip as before. Pubic symphysis appears intact. Mildly comminuted intertrochanteric fracture of the proximal left femur are with mild varus angulation. Left femoral head projects in joint. IMPRESSION: Acute mildly angulated left intertrochanteric fracture. Electronically Signed   By: [name] M.D.   On: 05/29/2016 03:35   Dg Femur Min 2 Views Left  Result Date: 05/30/2016 CLINICAL DATA:  Status post intertrochanteric hip fracture. EXAM: LEFT FEMUR 2 VIEWS COMPARISON:  Multiple priors. FINDINGS: Intramedullary rod and screw placement across a mildly angulated LEFT intertrochanteric fracture. Improved position and alignment. IMPRESSION: Satisfactory ORIF. Electronically Signed   By: Staci Righter M.D.   On: 05/30/2016 12:13   Dg Femur Min 2 Views Left  Result Date: 05/30/2016 CLINICAL DATA:  Intramedullary nail EXAM: LEFT FEMUR 2 VIEWS; DG C-ARM 61-120 MIN COMPARISON:  03/29/2017 FINDINGS: Internal fixation across the left intertrochanteric femoral fracture. Near anatomic alignment. No hardware complicating  feature. IMPRESSION: Internal fixation.  No complicating feature. Electronically Signed   By: Rolm Baptise M.D.   On: 05/30/2016 11:24       Today   Subjective:   Wanda [name]  feeling better denies any chest pain or shortness of breath  Objective:   Blood pressure (!) 95/56, pulse 85, temperature 98.2 F (36.8 C), temperature source Oral, resp. rate 16, height 5\' 1"  (1.549 m), weight 146 lb 3.2 oz (66.3 kg), SpO2 93 %.  .  Intake/Output Summary (Last 24 hours) at 06/02/16 1300 Last data filed at 06/02/16 0900  Gross per 24 hour  Intake              689 ml  Output                0 ml  Net              689 ml    Exam VITAL SIGNS: Blood pressure (!) 95/56, pulse 85, temperature 98.2 F (36.8 C), temperature source Oral, resp. rate 16, height 5\' 1"  (1.549 m), weight 146 lb 3.2 oz (66.3 kg), SpO2 93 %.  GENERAL:  61 y.o.-year-old patient lying in the bed with no acute distress.  EYES: Pupils equal, round, reactive to light and accommodation. No scleral icterus. Extraocular muscles intact.  HEENT: Head atraumatic, normocephalic. Oropharynx and nasopharynx clear.  NECK:  Supple, no jugular venous distention. No thyroid enlargement, no tenderness.  LUNGS: Normal breath sounds bilaterally, no wheezing, rales,rhonchi or crepitation. No use of accessory muscles of respiration.  CARDIOVASCULAR: S1, S2 normal. No murmurs, rubs, or gallops.  ABDOMEN: Soft, nontender, nondistended. Bowel sounds present. No organomegaly or mass.  EXTREMITIES: No pedal edema, cyanosis,  or clubbing.  NEUROLOGIC: Cranial nerves II through XII are intact. Muscle strength 5/5 in all extremities. Sensation intact. Gait not checked.  PSYCHIATRIC: The patient is alert and oriented x 3.  SKIN: No obvious rash, lesion, or ulcer.   Data Review     CBC w Diff: Lab Results  Component Value Date   WBC 8.2 06/02/2016   HGB 10.9 (L) 06/02/2016   HGB 12.1 12/04/2012   HCT 30.3 (L) 06/02/2016   HCT 34.2 (L)  12/04/2012   PLT 196 06/02/2016   PLT 269 12/04/2012   LYMPHOPCT 26.8 12/04/2012   MONOPCT 7.3 12/04/2012   EOSPCT 1.2 12/04/2012   BASOPCT 0.3 12/04/2012   CMP: Lab Results  Component Value Date   NA 144 06/02/2016   NA 140 12/04/2012   K 3.9 06/02/2016   K 3.8 12/04/2012   CL 111 06/02/2016   CL 107 12/04/2012   CO2 29 06/02/2016   CO2 26 12/04/2012   BUN 6 06/02/2016   BUN 16 12/04/2012   CREATININE 0.43 (L) 06/02/2016   CREATININE 0.64 12/04/2012   PROT 7.0 02/01/2010   ALBUMIN 3.7 02/01/2010   BILITOT 0.5 02/01/2010   ALKPHOS 61 02/01/2010   AST 25 02/01/2010   ALT 17 02/01/2010  .  Micro Results Recent Results (from the past 240 hour(s))  Surgical pcr screen     Status: Abnormal   Collection Time: 05/30/16  8:11 AM  Result Value Ref Range Status   MRSA, PCR NEGATIVE NEGATIVE Final   Staphylococcus aureus POSITIVE (A) NEGATIVE Final    Comment:        The Xpert SA Assay (FDA approved for NASAL specimens in patients over 44 years of age), is one component of a comprehensive surveillance program.  Test performance has been validated by [facility] for patients greater than or equal to 61 year old. It is not intended to diagnose infection nor to guide or monitor treatment.         Code Status Orders        Start     Ordered   05/29/16 [id number]  Full code  Continuous     05/29/16 0637    Code Status History    Date Active Date Inactive Code Status Order ID Comments User Context   This patient has a current code status but no historical code status.          Contact information for after-discharge care    [facility] .   Specialty:  [place] information: [place] [address] [phone]              Discharge Medications   Allergies as of 06/02/2016      Reactions   Levofloxacin    REACTION: N \\T \ V   Propoxyphene Hcl    REACTION:  N \\T \ V   Codeine Nausea Only   Slight nausea      Medication List    STOP taking these medications   prazosin 2 MG capsule Commonly known as:  MINIPRESS     TAKE these medications   atorvastatin 40 MG tablet Commonly known as:  LIPITOR Take by mouth.   buPROPion 300 MG 24 hr tablet Commonly known as:  WELLBUTRIN XL Take 300 mg by mouth daily.   busPIRone 15 MG tablet Commonly known as:  BUSPAR Take 15 mg by mouth 2 (two) times daily.   clonazePAM 0.5 MG  tablet Commonly known as:  KLONOPIN take 1/2 tablet twice a day and 1/2 to 1 tablet at bedtime   diclofenac 75 MG EC tablet Commonly known as:  VOLTAREN Take 75 mg by mouth 2 (two) times daily.   enoxaparin 40 MG/0.4ML injection Commonly known as:  LOVENOX Inject 0.4 mLs (40 mg total) into the skin daily. Start taking on:  123456   Garlic 123XX123 MG Caps Take 1 capsule by mouth daily.   midodrine 5 MG tablet Commonly known as:  PROAMATINE Take 1 tablet (5 mg total) by mouth 3 (three) times daily with meals.   multivitamin tablet Take 1 tablet by mouth daily.   nadolol 20 MG tablet Commonly known as:  CORGARD Take 1 tablet (20 mg total) by mouth daily. 1/2 tablet po daily What changed:  additional instructions   oxyCODONE 5 MG immediate release tablet Commonly known as:  Oxy IR/ROXICODONE Take 1-2 tablets (5-10 mg total) by mouth every 4 (four) hours as needed for breakthrough pain ((for MODERATE breakthrough pain)).   pantoprazole 40 MG tablet Commonly known as:  PROTONIX Take 40 mg by mouth 2 (two) times daily.   QUEtiapine 25 MG tablet Commonly known as:  SEROQUEL Take 1-2 tablets by mouth at bedtime.   sertraline 50 MG tablet Commonly known as:  ZOLOFT take 1/2 tablet by mouth once daily for 7 days then 1 tablet once daily THERAFTER   valACYclovir 1000 MG tablet Commonly known as:  VALTREX Take 1 tablet (1,000 mg total) by mouth daily.          Total Time in preparing paper work, data  evaluation and todays exam - 35 minutes  Dustin Flock M.D on 06/02/2016 at 1:00 [facility]  [place]  [phone]

## 2016-06-02 NOTE — Care Management Important Message (Signed)
Important Message  Patient Details  Name: ALAIJHA DILE MRN: [id number] Date of Birth: [birth year]   Medicare Important Message Given:  Yes    Jolly Mango, RN 06/02/2016, 1:53 PM

## 2016-06-03 LAB — URINALYSIS, ROUTINE W REFLEX MICROSCOPIC
Bilirubin Urine: NEGATIVE
GLUCOSE, UA: NEGATIVE mg/dL
Hgb urine dipstick: NEGATIVE
Ketones, ur: NEGATIVE mg/dL
NITRITE: NEGATIVE
PH: 7 (ref 5.0–8.0)
Protein, ur: NEGATIVE mg/dL
SPECIFIC GRAVITY, URINE: 1.015 (ref 1.005–1.030)

## 2016-06-03 MED ORDER — MENTHOL 3 MG MT LOZG
1.0000 | LOZENGE | OROMUCOSAL | Status: DC | PRN
  Filled 2016-06-03: qty 9

## 2016-06-03 MED ORDER — SODIUM CHLORIDE 0.9 % IV BOLUS (SEPSIS)
500.0000 mL | Freq: Once | INTRAVENOUS | Status: AC
  Administered 2016-06-03: 500 mL via INTRAVENOUS

## 2016-06-03 MED ORDER — SODIUM CHLORIDE 0.9 % IV SOLN
INTRAVENOUS | Status: DC
  Administered 2016-06-03 – 2016-06-04 (×2): via INTRAVENOUS

## 2016-06-03 NOTE — Progress Notes (Signed)
Physical Therapy Treatment Patient Details Name: Wanda [name] MRN: [id number] DOB: Aug 30, 1955 Today's Date: 06/03/2016    History of Present Illness Pt is a 61 y/o F s/p syncompal episode and fall, now s/p L hip IM nail.  Pt's PMH includes back surgery, shoulder surgery, spinal cord stimulator.    PT Comments    Pt had returned to bed from morning session; reports was up for 2-2.5 hours. Pt agreeable to PT for supine exercises only. BP running low 90s/50s. Pt participates well with supine exercises continuing to requires minimal assist on Left lower extremity; straight leg raise requires more assist. Continue PT to progress strength and all functional mobility as blood pressure values allow.   Follow Up Recommendations  SNF     Equipment Recommendations  Rolling walker with 5" wheels    Recommendations for Other Services       Precautions / Restrictions Precautions Precautions: Fall;Other (comment) (BP low) Restrictions Weight Bearing Restrictions: Yes LLE Weight Bearing: Weight bearing as tolerated    Mobility  Bed Mobility Overal bed mobility: Modified Independent Bed Mobility: Supine to Sit     Supine to sit: Modified independent (Device/Increase time)     General bed mobility comments: Not tested; pt in bed continuing to receive fluids. Was up in chair 2-2.5 hours; wishes to remain in bed currently  Transfers Overall transfer level: Needs assistance Equipment used: Rolling walker (2 wheeled) Transfers: Sit to/from Stand Sit to Stand: Min guard         General transfer comment: Cues for use of safe hand placement 100% of the time (Performed 2x)  Ambulation/Gait Ambulation/Gait assistance: Min guard Ambulation Distance (Feet): 2 Feet Assistive device: Rolling walker (2 wheeled) Gait Pattern/deviations: Step-to pattern Gait velocity: decreased Gait velocity interpretation: <1.8 ft/sec, indicative of risk for recurrent falls General Gait Details: slow,  steady, small steps; Mild to moderate ligthheadedness   Stairs            Wheelchair Mobility    Modified Rankin (Stroke Patients Only)       Balance Overall balance assessment: Needs assistance Sitting-balance support: Feet supported;Bilateral upper extremity supported Sitting balance-Leahy Scale: Good     Standing balance support: Bilateral upper extremity supported Standing balance-Leahy Scale: Fair Standing balance comment: Limited more due to lightheadedness                    Cognition Arousal/Alertness: Awake/alert Behavior During Therapy: WFL for tasks assessed/performed Overall Cognitive Status: Within Functional Limits for tasks assessed                      Exercises General Exercises - Lower Extremity Ankle Circles/Pumps: AROM;Both;20 reps;Supine Quad Sets: Strengthening;Both;20 reps;Supine Gluteal Sets: Strengthening;Both;20 reps;Supine Short Arc Quad: AROM;Both;20 reps;Supine Long Arc Quad: AROM;Strengthening;Both;10 reps;Seated (2 sets) Heel Slides: AAROM;Left;20 reps;Supine (AROM R) Hip ABduction/ADduction: AAROM;Left;20 reps;Supine (AROM R) Straight Leg Raises: AAROM;Left;20 reps;Supine (Strengthening R) Hip Flexion/Marching: AAROM;Left;10 reps;Seated (2 sets; AROM R) Toe Raises: AROM;Both;20 reps;Seated Heel Raises: AROM;Both;20 reps;Seated Other Exercises Other Exercises: partial bridge 10x    General Comments        Pertinent Vitals/Pain Pain Assessment: 0-10 Pain Score: 6  Pain Location: L hip/thigh Pain Intervention(s): Monitored during session    Home Living                      Prior Function            PT Goals (current goals can now be  found in the care plan section) Progress towards PT goals: Progressing toward goals    Frequency    BID      PT Plan Current plan remains appropriate    Co-evaluation             End of Session Equipment Utilized During Treatment: Gait belt Activity  Tolerance: Patient tolerated treatment well Patient left: in bed;with call bell/phone within reach;with bed alarm set;with SCD's reapplied     Time: [id number] PT Time Calculation (min) (ACUTE ONLY): 21 min  Charges:  $Gait Training: 8-22 mins $Therapeutic Exercise: 8-22 mins                    G CodesLarae Grooms, PTA 06/03/2016, 3:19 PM

## 2016-06-03 NOTE — Progress Notes (Signed)
Physical Therapy Treatment Patient Details Name: Wanda [name] MRN: [id number] DOB: 1955-09-26 Today's Date: 06/03/2016    History of Present Illness Pt is a 61 y/o F s/p syncompal episode and fall, now s/p L hip IM nail.  Pt's PMH includes back surgery, shoulder surgery, spinal cord stimulator.    PT Comments    Pt agreeable to PT. Per chart review and pt subjectively, hypotension continues to be an issue. This session pt continues with low blood pressure (86/40) in supine with monitor and manually; mildly improved sitting to 88/62 manually. Pt participates in both supine and seated exercises; requiring assist for Left lower extremity. Pt moves slowly with rest periods taken as needed. Mild lightheadedness with sit that does not resolve, but tolerable through exercise. Pt agreeable to upright in chair; chair brought to pt bedside for short distance transfer; pt performs well with Min guard of 1 and stand by assist of nurse for safety. Slightly increased dizziness; returns to baseline in chair. Continue PT this afternoon for progression of strength, endurance and tolerance to upright position as appropriate to improve all functional mobility.   Follow Up Recommendations  SNF     Equipment Recommendations  Rolling walker with 5" wheels    Recommendations for Other Services       Precautions / Restrictions Precautions Precautions: Fall;Other (comment) (Blood pressure (low)) Restrictions Weight Bearing Restrictions: Yes LLE Weight Bearing: Weight bearing as tolerated    Mobility  Bed Mobility Overal bed mobility: Modified Independent Bed Mobility: Supine to Sit     Supine to sit: Modified independent (Device/Increase time)     General bed mobility comments: Significant increased time/effort. Use of rail  Transfers Overall transfer level: Needs assistance Equipment used: Rolling walker (2 wheeled) Transfers: Sit to/from Stand Sit to Stand: Min guard         General  transfer comment: Cues for use of safe hand placement 100% of the time (Performed 2x)  Ambulation/Gait Ambulation/Gait assistance: Min guard Ambulation Distance (Feet): 2 Feet Assistive device: Rolling walker (2 wheeled) Gait Pattern/deviations: Step-to pattern Gait velocity: decreased Gait velocity interpretation: <1.8 ft/sec, indicative of risk for recurrent falls General Gait Details: slow, steady, small steps; Mild to moderate ligthheadedness   Stairs            Wheelchair Mobility    Modified Rankin (Stroke Patients Only)       Balance Overall balance assessment: Needs assistance Sitting-balance support: Feet supported;Bilateral upper extremity supported Sitting balance-Leahy Scale: Good     Standing balance support: Bilateral upper extremity supported Standing balance-Leahy Scale: Fair Standing balance comment: Limited more due to lightheadedness                    Cognition Arousal/Alertness: Awake/alert Behavior During Therapy: WFL for tasks assessed/performed Overall Cognitive Status: Within Functional Limits for tasks assessed                      Exercises General Exercises - Lower Extremity Ankle Circles/Pumps: AROM;Both;20 reps;Supine Quad Sets: Strengthening;Both;20 reps;Supine Gluteal Sets: Strengthening;Both;20 reps;Supine Long Arc Quad: AROM;Strengthening;Both;10 reps;Seated (2 sets) Heel Slides: AAROM;Left;Supine;15 reps (AROM R) Hip ABduction/ADduction: AAROM;Left;15 reps;Supine (AROM R) Straight Leg Raises: AAROM;Left;10 reps;Supine (AROM R) Hip Flexion/Marching: AAROM;Left;10 reps;Seated (2 sets; AROM R) Toe Raises: AROM;Both;20 reps;Seated Heel Raises: AROM;Both;20 reps;Seated    General Comments        Pertinent Vitals/Pain Pain Assessment: 0-10 Pain Score: 7  Pain Location: L hip/thigh Pain Intervention(s): Monitored during session;Premedicated before  session;Repositioned;Ice applied    Home Living                       Prior Function            PT Goals (current goals can now be found in the care plan section) Progress towards PT goals: Progressing toward goals (slowly)    Frequency    BID      PT Plan Current plan remains appropriate    Co-evaluation             End of Session Equipment Utilized During Treatment: Gait belt Activity Tolerance: Other (comment) (limited by low BP/lightheadedness) Patient left: in chair;with call bell/phone within reach;with chair alarm set;with nursing/sitter in room;Other (comment) (ice applied)     Time: 1053-1140 PT Time Calculation (min) (ACUTE ONLY): 47 min  Charges:  $Gait Training: 8-22 mins $Therapeutic Exercise: 23-37 mins                    G CodesLarae Grooms, PTA 06/03/2016, 12:24 PM

## 2016-06-03 NOTE — Progress Notes (Signed)
[place] at [facility]                                                                                                                                                                                  Patient Demographics   Wanda [name], is a 61 y.o. female, DOB - 10-Jun-1955, [id number]  Admit date - 05/29/2016   Admitting Physician [name], MD  Outpatient Primary MD for the patient is SOLES, MEREDITH KEY, MD   LOS - 5  Subjective: Discharge canceled yesterday secondary to hypotension. Patient still has hypotension but she denies any shortness of breath or hypoxia. No hematuria. Does feel dizzy at times.  Review of Systems:   CONSTITUTIONAL: No documented fever. No fatigue, weakness. No weight gain, no weight loss.  EYES: No blurry or double vision.  ENT: No tinnitus. No postnasal drip. No redness of the oropharynx.  RESPIRATORY: No cough, no wheeze, no hemoptysis. No dyspnea.  CARDIOVASCULAR: No chest pain. No orthopnea. No palpitations. No syncope.  GASTROINTESTINAL: No nausea, no vomiting or diarrhea. No abdominal pain. No melena or hematochezia.  GENITOURINARY: No dysuria or hematuria.  ENDOCRINE: No polyuria or nocturia. No heat or cold intolerance.  HEMATOLOGY: No anemia. No bruising. No bleeding.  INTEGUMENTARY: No rashes. No lesions.  MUSCULOSKELETAL: Left hip pain NEUROLOGIC: No numbness, tingling, or ataxia. No seizure-type activity.  PSYCHIATRIC: No anxiety. No insomnia. No ADD.    Vitals:   Vitals:   06/02/16 2012 06/03/16 0429 06/03/16 0749 06/03/16 0830  BP: (!) 91/46 93/61 (!) 87/49 96/72  Pulse: 86 83 92   Resp: 16 16    Temp: 98.8 F (37.1 C) 98.6 F (37 C) 98.2 F (36.8 C)   TempSrc: Oral Oral Oral   SpO2: 95% 91% 93%   Weight:      Height:        Wt Readings from Last 3 Encounters:  05/29/16 66.3 kg (146 lb 3.2 oz)  04/05/16 64.4 kg (142 lb)  03/29/16 64.4 kg (142 lb)     Intake/Output Summary (Last 24  hours) at 06/03/16 0859 Last data filed at 06/03/16 0615  Gross per 24 hour  Intake              960 ml  Output              300 ml  Net              660 ml    Physical Exam:   GENERAL: Pleasant-appearing in no apparent distress.  HEAD, EYES, EARS, NOSE AND THROAT: Atraumatic, normocephalic. Extraocular muscles are intact. Pupils equal and reactive to light. Sclerae anicteric. No  conjunctival injection. No oro-pharyngeal erythema.  NECK: Supple. There is no jugular venous distention. No bruits, no lymphadenopathy, no thyromegaly.  HEART: Regular rate and rhythm,. No murmurs, no rubs, no clicks.  LUNGS: Clear to auscultation bilaterally. No rales or rhonchi. No wheezes.  ABDOMEN: Soft, flat, nontender, nondistended. Has good bowel sounds. No hepatosplenomegaly appreciated.  EXTREMITIES: No evidence of any cyanosis, clubbing, or peripheral edema.  +2 pedal and radial pulses bilaterally.  NEUROLOGIC: The patient is alert, awake, and oriented x3 with no focal motor or sensory deficits appreciated bilaterally.  SKIN: Moist and warm with no rashes appreciated.  Psych: Not anxious, depressed LN: No inguinal LN enlargement    Antibiotics   Anti-infectives    Start     Dose/Rate Route Frequency Ordered Stop   05/30/16 1600  ceFAZolin (ANCEF) IVPB 2g/100 mL premix     2 g 200 mL/hr over 30 Minutes Intravenous Every 6 hours 05/30/16 1304 05/30/16 2215   05/30/16 0930  ceFAZolin (ANCEF) IVPB 2g/100 mL premix  Status:  Discontinued     2 g 200 mL/hr over 30 Minutes Intravenous NOW 05/30/16 0916 05/30/16 1304   05/29/16 1000  valACYclovir (VALTREX) tablet 1,000 mg     1,000 mg Oral Daily 05/29/16 0637        Medications   Scheduled Meds: . atorvastatin  40 mg Oral q1800  . Chlorhexidine Gluconate Cloth  6 each Topical Daily  . docusate sodium  100 mg Oral BID  . enoxaparin (LOVENOX) injection  40 mg Subcutaneous Q24H  . feeding supplement (ENSURE ENLIVE)  237 mL Oral TID BM  . ferrous  sulfate  325 mg Oral TID PC  . midodrine  10 mg Oral TID WC  . multivitamin with minerals  1 tablet Oral Daily  . mupirocin ointment  1 application Nasal BID  . pantoprazole  40 mg Oral BID  . QUEtiapine  25 mg Oral QHS  . senna  1 tablet Oral BID  . sertraline  50 mg Oral QHS  . valACYclovir  1,000 mg Oral Daily   Continuous Infusions:  PRN Meds:.acetaminophen **OR** acetaminophen, alum & mag hydroxide-simeth, bisacodyl, clonazePAM, HYDROmorphone (DILAUDID) injection, magnesium citrate, menthol-cetylpyridinium **OR** phenol, methocarbamol **OR** methocarbamol (ROBAXIN)  IV, ondansetron **OR** ondansetron (ZOFRAN) IV, oxyCODONE, polyethylene glycol   Data Review:   Micro Results Recent Results (from the past 240 hour(s))  Surgical pcr screen     Status: Abnormal   Collection Time: 05/30/16  8:11 AM  Result Value Ref Range Status   MRSA, PCR NEGATIVE NEGATIVE Final   Staphylococcus aureus POSITIVE (A) NEGATIVE Final    Comment:        The Xpert SA Assay (FDA approved for NASAL specimens in patients over 46 years of age), is one component of a comprehensive surveillance program.  Test performance has been validated by [facility] for patients greater than or equal to 60 year old. It is not intended to diagnose infection nor to guide or monitor treatment.     Radiology Reports Ct Head Wo Contrast  Result Date: 05/29/2016 CLINICAL DATA:  Fall with hip fracture, syncopal episode EXAM: CT HEAD WITHOUT CONTRAST TECHNIQUE: Contiguous axial images were obtained from the base of the skull through the vertex without intravenous contrast. COMPARISON:  None. FINDINGS: Brain: No evidence for acute territorial infarction, intracranial hemorrhage or focal mass lesion. No midline shift. Ventricles are nonenlarged. Minimal atrophy. Vascular: No hyperdense vessels.  No unexpected calcifications. Skull: Mastoid air cells are clear. No skull fracture  is visualized. Sinuses/Orbits: Minimal fluid  within the left sphenoid sinus. Mild mucosal thickening in the ethmoid sinuses. No acute orbital abnormality. Other: None IMPRESSION: No CT evidence for acute intracranial abnormality. Electronically Signed   By: [name] M.D.   On: 05/29/2016 03:41   Ct Angio Chest Pe W Or Wo Contrast  Result Date: 06/02/2016 CLINICAL DATA:  Tachycardia and hip surgery 3 days ago. EXAM: CT ANGIOGRAPHY CHEST WITH CONTRAST TECHNIQUE: Multidetector CT imaging of the chest was performed using the standard protocol during bolus administration of intravenous contrast. Multiplanar CT image reconstructions and MIPs were obtained to evaluate the vascular anatomy. CONTRAST:  75 mL Isovue 370 COMPARISON:  Chest CT 10/10/2007 FINDINGS: Cardiovascular: Negative for pulmonary embolism. Normal caliber of the thoracic aorta without dissection. Celiac trunk is patent and the main branch vessels are patent. Mediastinum/Nodes: Small amount of fluid in the superior pericardial recess. Esophagus is unremarkable. Thyroid tissue is mildly nodular and similar to the previous examination. There is no significant lymph node enlargement in the mediastinum or hilum. Lungs/Pleura: Small bilateral pleural effusions. Trachea and mainstem bronchi are patent. Compressive atelectasis in both lower lobes. No significant airspace disease in the lungs. Upper Abdomen: There are at least 2 low-density structures in the liver, largest is near the falciform ligament and measuring up to 1.4 cm. These low-density structures may have enlarged since 2009 but probably represent a benign etiology such as cyst or hemangioma. No acute abnormality in the abdomen. Musculoskeletal: There is an neural stimulator catheter in the thoracic spinal canal. Review of the MIP images confirms the above findings. IMPRESSION: Negative for pulmonary embolism.  Negative for aortic dissection. Small bilateral pleural effusions with bibasilar atelectasis. Probable hepatic cysts or  hemangiomas as described. Electronically Signed   By: [name] M.D.   On: 06/02/2016 15:18   US Carotid Bilateral  Result Date: 05/31/2016 CLINICAL DATA:  Syncope. EXAM: BILATERAL CAROTID DUPLEX ULTRASOUND TECHNIQUE: Pearline Cables scale imaging, color Doppler and duplex ultrasound were performed of bilateral carotid and vertebral arteries in the neck. COMPARISON:  None. FINDINGS: Criteria: Quantification of carotid stenosis is based on velocity parameters that correlate the residual internal carotid diameter with NASCET-based stenosis levels, using the diameter of the distal internal carotid lumen as the denominator for stenosis measurement. The following velocity measurements were obtained: RIGHT ICA:  149/68 cm/sec CCA:  123456 cm/sec SYSTOLIC ICA/CCA RATIO:  1.4 DIASTOLIC ICA/CCA RATIO:  2.0 ECA:  94 cm/sec LEFT ICA:  102/39 cm/sec CCA:  123XX123 cm/sec SYSTOLIC ICA/CCA RATIO:  0.9 DIASTOLIC ICA/CCA RATIO:  1.0 ECA:  102 cm/sec RIGHT CAROTID ARTERY: Mild right carotid bifurcation atherosclerotic vascular plaque. RIGHT VERTEBRAL ARTERY:  Patent with antegrade flow. LEFT CAROTID ARTERY: Mild left common carotid, carotid bifurcation atherosclerotic vascular plaque. No flow limiting stenosis. LEFT VERTEBRAL ARTERY:  Patent antegrade flow . IMPRESSION: Mild bilateral carotid atherosclerotic vascular disease. No flow limiting stenosis. Degree of stenosis less than 50% bilaterally. 2. Vertebral arteries are patent with antegrade flow. Electronically Signed   By: Marcello Moores  Register   On: 05/31/2016 13:55   Dg Chest 1 View  Result Date: 05/29/2016 CLINICAL DATA:  Preop, fall with hip fracture EXAM: CHEST 1 VIEW COMPARISON:  06/23/2010 FINDINGS: There is no focal pulmonary infiltrate, consolidation or effusion. Cardiomediastinal silhouette is nonenlarged. There is no pneumothorax. Stimulator leads are visualized over the spine. IMPRESSION: No radiographic evidence for acute cardiopulmonary abnormality Electronically Signed    By: [name] M.D.   On: 05/29/2016 03:37   Dg  C-arm 61-120 Min  Result Date: 05/30/2016 CLINICAL DATA:  Intramedullary nail EXAM: LEFT FEMUR 2 VIEWS; DG C-ARM 61-120 MIN COMPARISON:  03/29/2017 FINDINGS: Internal fixation across the left intertrochanteric femoral fracture. Near anatomic alignment. No hardware complicating feature. IMPRESSION: Internal fixation.  No complicating feature. Electronically Signed   By: Rolm Baptise M.D.   On: 05/30/2016 11:24   Dg Hip Unilat W Or Wo Pelvis 2-3 Views Left  Result Date: 05/29/2016 CLINICAL DATA:  Fall with left hip pain EXAM: DG HIP (WITH OR WITHOUT PELVIS) 2-3V LEFT COMPARISON:  10/18/2015 FINDINGS: Left lower quadrant generator again noted. The right femoral head appears normally positioned and there are degenerative changes of the right hip as before. Pubic symphysis appears intact. Mildly comminuted intertrochanteric fracture of the proximal left femur are with mild varus angulation. Left femoral head projects in joint. IMPRESSION: Acute mildly angulated left intertrochanteric fracture. Electronically Signed   By: [name] M.D.   On: 05/29/2016 03:35   Dg Femur Min 2 Views Left  Result Date: 05/30/2016 CLINICAL DATA:  Status post intertrochanteric hip fracture. EXAM: LEFT FEMUR 2 VIEWS COMPARISON:  Multiple priors. FINDINGS: Intramedullary rod and screw placement across a mildly angulated LEFT intertrochanteric fracture. Improved position and alignment. IMPRESSION: Satisfactory ORIF. Electronically Signed   By: Staci Righter M.D.   On: 05/30/2016 12:13   Dg Femur Min 2 Views Left  Result Date: 05/30/2016 CLINICAL DATA:  Intramedullary nail EXAM: LEFT FEMUR 2 VIEWS; DG C-ARM 61-120 MIN COMPARISON:  03/29/2017 FINDINGS: Internal fixation across the left intertrochanteric femoral fracture. Near anatomic alignment. No hardware complicating feature. IMPRESSION: Internal fixation.  No complicating feature. Electronically Signed   By: Rolm Baptise M.D.    On: 05/30/2016 11:24     CBC  Recent Labs Lab 05/29/16 0304 05/31/16 0434 06/01/16 0403 06/02/16 0359  WBC 9.3 11.8* 8.7 8.2  HGB 12.9 9.4* 8.4* 10.9*  HCT 36.1 25.9* 23.8* 30.3*  PLT 226 158 151 196  MCV 101.3* 101.7* 102.5* 98.9  MCH 36.2* 36.9* 36.3* 35.5*  MCHC 35.8 36.3* 35.4 35.9  RDW 13.0 12.6 12.4 14.5    Chemistries   Recent Labs Lab 05/29/16 0304 05/31/16 0434 06/01/16 0403 06/02/16 0359  NA 140 140 141 144  K 3.3* 3.5 2.9* 3.9  CL 108 109 109 111  CO2 24 26 29 29   GLUCOSE 158* 132* 120* 118*  BUN 6 <5* <5* 6  CREATININE 0.62 0.37* 0.34* 0.43*  CALCIUM 8.6* 8.1* 7.6* 8.0*   ------------------------------------------------------------------------------------------------------------------ estimated creatinine clearance is 65.2 mL/min (by C-G formula based on SCr of 0.43 mg/dL (L)). ------------------------------------------------------------------------------------------------------------------ No results for input(s): HGBA1C in the last 72 hours. ------------------------------------------------------------------------------------------------------------------ No results for input(s): CHOL, HDL, LDLCALC, TRIG, CHOLHDL, LDLDIRECT in the last 72 hours. ------------------------------------------------------------------------------------------------------------------ No results for input(s): TSH, T4TOTAL, T3FREE, THYROIDAB in the last 72 hours.  Invalid input(s): FREET3 ------------------------------------------------------------------------------------------------------------------ No results for input(s): VITAMINB12, FOLATE, FERRITIN, TIBC, IRON, RETICCTPCT in the last 72 hours.  Coagulation profile No results for input(s): INR, PROTIME in the last 168 hours.  No results for input(s): DDIMER in the last 72 hours.  Cardiac Enzymes  Recent Labs Lab 05/29/16 0304  TROPONINI <0.03    ------------------------------------------------------------------------------------------------------------------ Invalid input(s): POCBNP    Assessment & Plan   Patient is a 61 year old admitted with a syncopal episode with  Left  hip fracture  1.left hip fx ;s/p repair ;supportive care ,incentive spirometry, pain medicines, DVT prophylaxis  2.  Syncope with hypotention - a cardiogram: Carotids are within  normal limits. Check orthostatic hypotension. Hypotension likely multifactorial secondary to antidepressants, narcotics; encourage  liberal fluid intake, can give 2 L of normal saline bolus.   3.  Hyperlipidemia; unspecified continue atorvastatin    4. Essential hypertension ; no hypotension ;hold her prazosin, and nadolol.  5. GERD we'll continue Protonix  6. Miscellaneous Lovenox  7. depression: Patient on  seroquel., Zoloft, BuSpar, Klonopin. Hold the discharge  unitl  blood pressure improves. Check the urine for possible infection also.      Code Status Orders        Start     Ordered   05/29/16 [id number]  Full code  Continuous     05/29/16 0637    Code Status History    Date Active Date Inactive Code Status Order ID Comments User Context   This patient has a current code status but no historical code status.           Consults Orthopedics   DVT Prophylaxis  Lovenox   Lab Results  Component Value Date   PLT 196 06/02/2016     Time Spent in minutes 52min Greater than 50% of time spent in care coordination and counseling patient regarding the condition and plan of care.   Epifanio Lesches M.D on 06/03/2016 at 8:59 AM  Between 7am to 6pm - Pager - [phone]  After 6pm go to [email or web address] - password [email or web address] [facility]   Office  [phone]

## 2016-06-03 NOTE — Progress Notes (Signed)
Pt's BP checked at rounds. Right arm 72/38, left arm 90/60. Pulse is 82. Pt denies dizziness, reports/appears asymptomatic. Denies cardiac history. Prime Doc paged, orders placed for 522mL bolus 0.9 NS, then 129mL/hr continuous. Pt agreeable. Will continue to monitor.

## 2016-06-04 MED ORDER — CEPHALEXIN 500 MG PO CAPS: 500.0000 mg | ORAL_CAPSULE | Freq: Two times a day (BID) | ORAL | 0 refills | Status: DC

## 2016-06-04 MED ORDER — MIDODRINE HCL 10 MG PO TABS: 10.0000 mg | ORAL_TABLET | Freq: Three times a day (TID) | ORAL | 0 refills | Status: DC

## 2016-06-04 MED ORDER — CEFTRIAXONE SODIUM-DEXTROSE 1-3.74 GM-% IV SOLR
1.0000 g | Freq: Every day | INTRAVENOUS | Status: DC
  Administered 2016-06-04: 1 g via INTRAVENOUS
  Filled 2016-06-04: qty 50

## 2016-06-04 MED ORDER — DEXTROSE 5 % IV SOLN: 1.0000 g | INTRAVENOUS | Status: DC

## 2016-06-04 NOTE — Progress Notes (Signed)
Report given to nurse at [facility]. EMS will transport pt.

## 2016-06-04 NOTE — Progress Notes (Signed)
Occupational Therapy Treatment Patient Details Name: Wanda [name] MRN: [id number] DOB: Feb 17, 1956 Today's Date: 06/04/2016    History of present illness     OT comments  Patient was sitting up in recliner when OT arrived. Per nursing, patient continues to have low BP and dizziness. Nursing stated OT could treat, but to limit all ambulation and perform transfers only as needed. OT and patient performed education of AE for lower body dressing from seated position due to limitations. Patient continues to improve use of AE. Will continue to assess transfers as BP stabilizes.    Follow Up Recommendations  SNF    Equipment Recommendations       Recommendations for Other Services      Precautions / Restrictions Precautions Precautions: Fall;Other (comment) Precaution Comments: monitor BP; Nursing states transfers only today (no ambulation) Restrictions Weight Bearing Restrictions: Yes LLE Weight Bearing: Weight bearing as tolerated       Mobility Bed Mobility                  Transfers                      Balance                                   ADL Overall ADL's : Needs assistance/impaired                     Lower Body Dressing: Moderate assistance     Toilet Transfer Details (indicate cue type and reason): not tested this date due to dizziness/BP           General ADL Comments: Patient is requiring increased assistance with self care tasks, unable to tolerate sitting up and increased pain this date.  Will have to further assess transfers next session.       Vision                     Perception     Praxis      Cognition   Behavior During Therapy: [facility] for tasks assessed/performed Overall Cognitive Status: Within Functional Limits for tasks assessed                       Extremity/Trunk Assessment               Exercises     Shoulder Instructions       General Comments       Pertinent Vitals/ Pain       Pain Assessment: 0-10 Pain Score: 7  Pain Location: L hip /thigh Pain Intervention(s): Premedicated before session;Monitored during session;Limited activity within patient's tolerance  Home Living                                          Prior Functioning/Environment              Frequency  Min 1X/week        Progress Toward Goals  OT Goals(current goals can now be found in the care plan section)  Progress towards OT goals: Progressing toward goals (slow progress due to BP)  Acute Rehab OT Goals Patient Stated Goal: Patient wants to be able to return home and be independent OT Goal Formulation: With patient  Time For Goal Achievement: 06/10/16 Potential to Achieve Goals: Fair  Plan      Co-evaluation                 End of Session     Activity Tolerance Patient limited by pain;Other (comment) (BP issues)   Patient Left in chair;with call bell/phone within reach;with chair alarm set   Nurse Communication          Time: [phone] OT Time Calculation (min): 15 min  Charges: OT General Charges $OT Visit: 1 Procedure OT Treatments $Self Care/Home Management : 8-22 mins  Sou Nohr L 06/04/2016, 8:15 PM  Amie [place], OTR/L

## 2016-06-04 NOTE — Clinical Social Work Note (Signed)
Patient to dc to [facility] today via non-emergent EMS. Patient, family, and facility are aware. Packet has been delivered and updated dc summary has been sent to the facility. CSW will con't to assist pending additional dc needs.  Santiago Bumpers, MSW, LCSW-A [phone]

## 2016-06-04 NOTE — Progress Notes (Signed)
Physical Therapy Treatment Patient Details Name: Wanda [name] MRN: [id number] DOB: 1955/06/11 Today's Date: 06/04/2016    History of Present Illness Pt is a 61 y/o F s/p syncompal episode and fall, now s/p L hip IM nail.  Pt's PMH includes back surgery, shoulder surgery, spinal cord stimulator.    PT Comments    Patient tolerated session well; Her BP is improving; When sitting edge of bed, BP was 118/66 without complaint of dizziness. Patient able to progress LE strengthening exercise with better AROM on LLE. She does continues to require some AAROM with hip abduction and SLR flexion for better ROM to increase hip strength; Patient also able to progress gait training today; She ambulated 75 feet with RW, CGA for safety with slow steady steps; She does demonstrate antalgic gait pattern with step to pattern. She would benefit from additional skilled PT intervention to improve LE strength, balance/gait safety and increase functional mobility;   Follow Up Recommendations  SNF     Equipment Recommendations  Rolling walker with 5" wheels    Recommendations for Other Services       Precautions / Restrictions Precautions Precautions: Fall;Other (comment) (BP low) Precaution Comments: monitor BP Restrictions Weight Bearing Restrictions: Yes LLE Weight Bearing: Weight bearing as tolerated    Mobility  Bed Mobility Overal bed mobility: Modified Independent Bed Mobility: Supine to Sit;Sit to Supine     Supine to sit: Modified independent (Device/Increase time) Sit to supine: Modified independent (Device/Increase time)   General bed mobility comments: pt able to use RLE to assist in moving LLE off bed; Patient able to lift LLE into bed mod I; She does use bed rails partially;   Transfers Overall transfer level: Needs assistance Equipment used: Rolling walker (2 wheeled) Transfers: Sit to/from Stand   Stand pivot transfers: Supervision       General transfer comment: cues  required for hand placement and to advance LLE forward prior to sit/stand to improve comfort in left hip;   Ambulation/Gait Ambulation/Gait assistance: Min guard Ambulation Distance (Feet): 75 Feet Assistive device: Rolling walker (2 wheeled) Gait Pattern/deviations: Step-to pattern;Antalgic;Decreased dorsiflexion - left;Decreased dorsiflexion - right;Decreased step length - right;Narrow base of support Gait velocity: decreased    General Gait Details: pt ambulated 15 feet x1 to bathroom; then 75 feet x1 with RW, CGA for safety demonstrating antalgic gait left with slow steady small steps; denies any dizziness;    Stairs            Wheelchair Mobility    Modified Rankin (Stroke Patients Only)       Balance Overall balance assessment: Needs assistance Sitting-balance support: Feet supported;Bilateral upper extremity supported Sitting balance-Leahy Scale: Good Sitting balance - Comments: Close supervision required;    Standing balance support: Bilateral upper extremity supported Standing balance-Leahy Scale: Fair Standing balance comment: requires CGA for safety during ambulation;                     Cognition Arousal/Alertness: Awake/alert Behavior During Therapy: WFL for tasks assessed/performed Overall Cognitive Status: Within Functional Limits for tasks assessed                      Exercises Total Joint Exercises Ankle Circles/Pumps: AROM;Strengthening;Both;15 reps;Supine Quad Sets: AROM;Both;15 reps;Supine Heel Slides: AROM;Both;15 reps;Supine Hip ABduction/ADduction: AROM;AAROM;Right;Left;15 reps;Supine (AAROM on LLE for better ROM; ) Straight Leg Raises: AROM;AAROM;Right;Left;15 reps;Supine (AAROM on LLE for better ROM/strengthening; ) Other Exercises Other Exercises: Patient required min VCs to increase ROM  for better strengthening; Patient able to complete all AROM on RLE with all exercise; However did require AAROM on LLE with hip  abduction/adduction and SLR flexion;     General Comments        Pertinent Vitals/Pain Pain Assessment: 0-10 Pain Score: 7  Pain Location: L hip /thigh Pain Descriptors / Indicators: Guarding;Aching;Sore Pain Intervention(s): Monitored during session;Repositioned    Home Living                      Prior Function            PT Goals (current goals can now be found in the care plan section) Acute Rehab PT Goals Patient Stated Goal: Patient wants to be able to return home and be independent PT Goal Formulation: With patient Time For Goal Achievement: 06/09/16 Potential to Achieve Goals: Good Progress towards PT goals: Progressing toward goals    Frequency    BID      PT Plan Current plan remains appropriate    Co-evaluation             End of Session Equipment Utilized During Treatment: Gait belt Activity Tolerance: Patient tolerated treatment well;Other (comment) (less dizziness today and better BP) Patient left: with call bell/phone within reach;in bed;with bed alarm set (ice applied)     Time: [id number] PT Time Calculation (min) (ACUTE ONLY): 26 min  Charges:  $Gait Training: 8-22 mins $Therapeutic Exercise: 8-22 mins                    G Codes:      Venise Ellingwood PT, DPT 06/04/2016, 12:19 PM

## 2016-06-04 NOTE — Discharge Summary (Signed)
[facility] - [name] at [facility], [name] 61 y.o., DOB May 01, 1956, MRN [id number]. 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 She was noted to have a left intertrochanteric hip fracture. Patient underwent a workup for her syncope including carotid Dopplers as well as echocardiogram of the heart. Patient was seen by orthopedics and underwent hip surgery. Patient had hypotension postop and received IV fluids and blood transfusion. She was also started on Midrodrine. Patient's blood pressure medications were held. She has been asymptomatic with the hypotension. Patient is in need of rehabilitation which is currently being arranged.  2. UTI: Urine cultures pending. Because of hypotension and blood cultures negative for pneumonia workup negative but does have urine infection. Received Rocephin in the hospital but discharging him with Keflex.   #3 hypotension: Started on midodrine 10 mg by mouth 3 times a day, patient blood pressure medicines held. She needs but was a checkup every 12 hours and titrate and wean off midodrine. Hypertension the hospital likely due to combination of infection, multiple medications including antidepressants, pain medicines.  #4 disposition to [facility]. Continue Lovenox as prescribed and finish the course for DVT prophylaxis.       Consults  orthopedic surgery  Significant Tests:  See full reports for all details    Ct Head Wo Contrast  Result  Date: 05/29/2016 CLINICAL DATA:  Fall with hip fracture, syncopal episode EXAM: CT HEAD WITHOUT CONTRAST TECHNIQUE: Contiguous axial images were obtained from the base of the skull through the vertex without intravenous contrast. COMPARISON:  None. FINDINGS: Brain: No evidence for acute territorial infarction, intracranial hemorrhage or focal mass lesion. No midline shift. Ventricles are nonenlarged. Minimal atrophy. Vascular: No hyperdense vessels.  No unexpected calcifications. Skull: Mastoid air cells are clear. No skull fracture is visualized. Sinuses/Orbits: Minimal fluid within the left sphenoid sinus. Mild mucosal thickening in the ethmoid sinuses. No acute orbital abnormality. Other: None IMPRESSION: No CT evidence for acute intracranial abnormality. Electronically Signed   By: [name] M.D.   On: 05/29/2016 03:41   Ct Angio Chest Pe W Or Wo Contrast  Result Date: 06/02/2016 CLINICAL DATA:  Tachycardia and hip surgery 3 days ago. EXAM: CT ANGIOGRAPHY CHEST WITH CONTRAST TECHNIQUE: Multidetector CT imaging of the chest was performed using the standard protocol during bolus administration of intravenous contrast. Multiplanar CT image reconstructions and MIPs were obtained to evaluate the vascular anatomy. CONTRAST:  75 mL Isovue 370 COMPARISON:  Chest CT 10/10/2007 FINDINGS: Cardiovascular: Negative for pulmonary embolism. Normal caliber of the thoracic aorta without dissection. Celiac trunk is patent and the main branch vessels are patent. Mediastinum/Nodes: Small amount of fluid in the superior pericardial recess. Esophagus is unremarkable. Thyroid tissue is mildly nodular and similar to the previous examination. There is no significant lymph node enlargement in the mediastinum or hilum. Lungs/Pleura: Small bilateral pleural effusions. Trachea and mainstem bronchi are patent. Compressive atelectasis in both lower lobes. No significant airspace disease in the lungs. Upper Abdomen: There  are at least 2  low-density structures in the liver, largest is near the falciform ligament and measuring up to 1.4 cm. These low-density structures may have enlarged since 2009 but probably represent a benign etiology such as cyst or hemangioma. No acute abnormality in the abdomen. Musculoskeletal: There is an neural stimulator catheter in the thoracic spinal canal. Review of the MIP images confirms the above findings. IMPRESSION: Negative for pulmonary embolism.  Negative for aortic dissection. Small bilateral pleural effusions with bibasilar atelectasis. Probable hepatic cysts or hemangiomas as described. Electronically Signed   By: [name] M.D.   On: 06/02/2016 15:18   US Carotid Bilateral  Result Date: 05/31/2016 CLINICAL DATA:  Syncope. EXAM: BILATERAL CAROTID DUPLEX ULTRASOUND TECHNIQUE: Pearline Cables scale imaging, color Doppler and duplex ultrasound were performed of bilateral carotid and vertebral arteries in the neck. COMPARISON:  None. FINDINGS: Criteria: Quantification of carotid stenosis is based on velocity parameters that correlate the residual internal carotid diameter with NASCET-based stenosis levels, using the diameter of the distal internal carotid lumen as the denominator for stenosis measurement. The following velocity measurements were obtained: RIGHT ICA:  149/68 cm/sec CCA:  123456 cm/sec SYSTOLIC ICA/CCA RATIO:  1.4 DIASTOLIC ICA/CCA RATIO:  2.0 ECA:  94 cm/sec LEFT ICA:  102/39 cm/sec CCA:  123XX123 cm/sec SYSTOLIC ICA/CCA RATIO:  0.9 DIASTOLIC ICA/CCA RATIO:  1.0 ECA:  102 cm/sec RIGHT CAROTID ARTERY: Mild right carotid bifurcation atherosclerotic vascular plaque. RIGHT VERTEBRAL ARTERY:  Patent with antegrade flow. LEFT CAROTID ARTERY: Mild left common carotid, carotid bifurcation atherosclerotic vascular plaque. No flow limiting stenosis. LEFT VERTEBRAL ARTERY:  Patent antegrade flow . IMPRESSION: Mild bilateral carotid atherosclerotic vascular disease. No flow limiting stenosis. Degree of stenosis less  than 50% bilaterally. 2. Vertebral arteries are patent with antegrade flow. Electronically Signed   By: Marcello Moores  Register   On: 05/31/2016 13:55   Dg Chest 1 View  Result Date: 05/29/2016 CLINICAL DATA:  Preop, fall with hip fracture EXAM: CHEST 1 VIEW COMPARISON:  06/23/2010 FINDINGS: There is no focal pulmonary infiltrate, consolidation or effusion. Cardiomediastinal silhouette is nonenlarged. There is no pneumothorax. Stimulator leads are visualized over the spine. IMPRESSION: No radiographic evidence for acute cardiopulmonary abnormality Electronically Signed   By: [name] M.D.   On: 05/29/2016 03:37   Dg C-arm 61-120 Min  Result Date: 05/30/2016 CLINICAL DATA:  Intramedullary nail EXAM: LEFT FEMUR 2 VIEWS; DG C-ARM 61-120 MIN COMPARISON:  03/29/2017 FINDINGS: Internal fixation across the left intertrochanteric femoral fracture. Near anatomic alignment. No hardware complicating feature. IMPRESSION: Internal fixation.  No complicating feature. Electronically Signed   By: Rolm Baptise M.D.   On: 05/30/2016 11:24   Dg Hip Unilat W Or Wo Pelvis 2-3 Views Left  Result Date: 05/29/2016 CLINICAL DATA:  Fall with left hip pain EXAM: DG HIP (WITH OR WITHOUT PELVIS) 2-3V LEFT COMPARISON:  10/18/2015 FINDINGS: Left lower quadrant generator again noted. The right femoral head appears normally positioned and there are degenerative changes of the right hip as before. Pubic symphysis appears intact. Mildly comminuted intertrochanteric fracture of the proximal left femur are with mild varus angulation. Left femoral head projects in joint. IMPRESSION: Acute mildly angulated left intertrochanteric fracture. Electronically Signed   By: [name] M.D.   On: 05/29/2016 03:35   Dg Femur Min 2 Views Left  Result Date: 05/30/2016 CLINICAL DATA:  Status post intertrochanteric hip fracture. EXAM: LEFT FEMUR 2 VIEWS COMPARISON:  Multiple priors. FINDINGS: Intramedullary rod and screw placement across a mildly  angulated  LEFT intertrochanteric fracture. Improved position and alignment. IMPRESSION: Satisfactory ORIF. Electronically Signed   By: Staci Righter M.D.   On: 05/30/2016 12:13   Dg Femur Min 2 Views Left  Result Date: 05/30/2016 CLINICAL DATA:  Intramedullary nail EXAM: LEFT FEMUR 2 VIEWS; DG C-ARM 61-120 MIN COMPARISON:  03/29/2017 FINDINGS: Internal fixation across the left intertrochanteric femoral fracture. Near anatomic alignment. No hardware complicating feature. IMPRESSION: Internal fixation.  No complicating feature. Electronically Signed   By: Rolm Baptise M.D.   On: 05/30/2016 11:24       Today   Subjective:   Wanda [name]  feeling better denies any chest pain or shortness of breath  Objective:   Blood pressure 105/61, pulse (!) 107, temperature 98.3 F (36.8 C), temperature source Oral, resp. rate 16, height 5\' 1"  (1.549 m), weight 66.3 kg (146 lb 3.2 oz), SpO2 95 %.  .  Intake/Output Summary (Last 24 hours) at 06/04/16 1203 Last data filed at 06/04/16 0400  Gross per 24 hour  Intake          1958.33 ml  Output              300 ml  Net          1658.33 ml    Exam VITAL SIGNS: Blood pressure 105/61, pulse (!) 107, temperature 98.3 F (36.8 C), temperature source Oral, resp. rate 16, height 5\' 1"  (1.549 m), weight 66.3 kg (146 lb 3.2 oz), SpO2 95 %.  GENERAL:  60 y.o.-year-old patient lying in the bed with no acute distress.  EYES: Pupils equal, round, reactive to light and accommodation. No scleral icterus. Extraocular muscles intact.  HEENT: Head atraumatic, normocephalic. Oropharynx and nasopharynx clear.  NECK:  Supple, no jugular venous distention. No thyroid enlargement, no tenderness.  LUNGS: Normal breath sounds bilaterally, no wheezing, rales,rhonchi or crepitation. No use of accessory muscles of respiration.  CARDIOVASCULAR: S1, S2 normal. No murmurs, rubs, or gallops.  ABDOMEN: Soft, nontender, nondistended. Bowel sounds present. No organomegaly or mass.   EXTREMITIES: No pedal edema, cyanosis, or clubbing.  NEUROLOGIC: Cranial nerves II through XII are intact. Muscle strength 5/5 in all extremities. Sensation intact. Gait not checked.  PSYCHIATRIC: The patient is alert and oriented x 3.  SKIN: No obvious rash, lesion, or ulcer.   Data Review     CBC w Diff:  Lab Results  Component Value Date   WBC 8.2 06/02/2016   HGB 10.9 (L) 06/02/2016   HGB 12.1 12/04/2012   HCT 30.3 (L) 06/02/2016   HCT 34.2 (L) 12/04/2012   PLT 196 06/02/2016   PLT 269 12/04/2012   LYMPHOPCT 26.8 12/04/2012   MONOPCT 7.3 12/04/2012   EOSPCT 1.2 12/04/2012   BASOPCT 0.3 12/04/2012   CMP:  Lab Results  Component Value Date   NA 144 06/02/2016   NA 140 12/04/2012   K 3.9 06/02/2016   K 3.8 12/04/2012   CL 111 06/02/2016   CL 107 12/04/2012   CO2 29 06/02/2016   CO2 26 12/04/2012   BUN 6 06/02/2016   BUN 16 12/04/2012   CREATININE 0.43 (L) 06/02/2016   CREATININE 0.64 12/04/2012   PROT 7.0 02/01/2010   ALBUMIN 3.7 02/01/2010   BILITOT 0.5 02/01/2010   ALKPHOS 61 02/01/2010   AST 25 02/01/2010   ALT 17 02/01/2010  .  Micro Results Recent Results (from the past 240 hour(s))  Surgical pcr screen     Status: Abnormal   Collection Time: 05/30/16  8:11 AM  Result Value Ref Range Status   MRSA, PCR NEGATIVE NEGATIVE Final   Staphylococcus aureus POSITIVE (A) NEGATIVE Final    Comment:        The Xpert SA Assay (FDA approved for NASAL specimens in patients over 64 years of age), is one component of a comprehensive surveillance program.  Test performance has been validated by [facility] for patients greater than or equal to 26 year old. It is not intended to diagnose infection nor to guide or monitor treatment.         Code Status Orders        Start     Ordered   05/29/16 [id number]  Full code  Continuous     05/29/16 0637    Code Status History    Date Active Date Inactive Code Status Order ID Comments User Context   This  patient has a current code status but no historical code status.          Contact information for after-discharge care    [facility].   Specialty:  [place] information: [place] [phone]              Discharge Medications   Allergies as of 06/04/2016      Reactions   Levofloxacin    REACTION: N \\T \ V   Propoxyphene Hcl    REACTION: N \\T \ V   Codeine Nausea Only   Slight nausea      Medication List    STOP taking these medications   prazosin 2 MG capsule Commonly known as:  MINIPRESS     TAKE these medications   atorvastatin 40 MG tablet Commonly known as:  LIPITOR Take by mouth.   buPROPion 300 MG 24 hr tablet Commonly known as:  WELLBUTRIN XL Take 300 mg by mouth daily.   busPIRone 15 MG tablet Commonly known as:  BUSPAR Take 15 mg by mouth 2 (two) times daily.   cephALEXin 500 MG capsule Commonly known as:  KEFLEX Take 1 capsule (500 mg total) by mouth 2 (two) times daily.   clonazePAM 0.5 MG tablet Commonly known as:  KLONOPIN take 1/2 tablet twice a day and 1/2 to 1 tablet at bedtime   diclofenac 75 MG EC tablet Commonly known as:  VOLTAREN Take 75 mg by mouth 2 (two) times daily.   enoxaparin 40 MG/0.4ML injection Commonly known as:  LOVENOX Inject 0.4 mLs (40 mg total) into the skin daily.   Garlic [id number] MG Caps Take 1 capsule by mouth daily.   midodrine 10 MG tablet Commonly known as:  PROAMATINE Take 1 tablet (10 mg total) by mouth 3 (three) times daily with meals.   multivitamin tablet Take 1 tablet by mouth daily.   nadolol 20 MG tablet Commonly known as:  CORGARD Take 1 tablet (20 mg total) by mouth daily. 1/2 tablet po daily What changed:  additional instructions   oxyCODONE 5 MG immediate release tablet Commonly known as:  Oxy IR/ROXICODONE Take 1-2 tablets (5-10 mg total) by mouth every 4 (four) hours as  needed for breakthrough pain ((for MODERATE breakthrough pain)).   pantoprazole 40 MG tablet Commonly known as:  PROTONIX Take 40 mg by mouth 2 (two) times daily.   QUEtiapine 25 MG tablet Commonly known as:  SEROQUEL Take 1-2 tablets by mouth at bedtime.   sertraline 50 MG tablet Commonly known as:  ZOLOFT take 1/2 tablet  by mouth once daily for 7 days then 1 tablet once daily THERAFTER   valACYclovir 1000 MG tablet Commonly known as:  VALTREX Take 1 tablet (1,000 mg total) by mouth daily.          Total Time in preparing paper work, data evaluation and todays exam - 44 minutes  Boyce Keltner M.D on 06/04/2016 at 12:03 PM  [facility]   Office  [phone]

## 2016-06-06 LAB — URINE CULTURE: Culture: 50000 — AB

## 2016-06-11 NOTE — ED Provider Notes (Signed)
[facility] Emergency Department Provider Note __   First MD Initiated Contact with Patient 05/29/16 0245     (approximate)  I have reviewed the triage vital signs and the nursing notes.   HISTORY  Chief Complaint Loss of Consciousness; Hip Pain; and Fall    HPI Wanda [name] is a 61 y.o. female presents to the emergency department following a syncopal episode. Patient states that she arose from bed to go to the bathroom and subsequently awoke on the floor. Patient denies any preceding symptoms. No chest pain shortness breath headache dizziness nausea or vomiting. Patient currently admitting to 9 out of 10 left hip pain. Patient states that pain is worse with attempted ambulation and movement.   Past Medical History:  Diagnosis Date  . Anxiety   . BRONCHITIS, ACUTE WITH BRONCHOSPASM 06/25/2010   Qualifier: Diagnosis of  By: Alveta Heimlich MD, Cornelia Copa    . Depression   . GASTROENTERITIS WITHOUT DEHYDRATION 05/14/2009   Qualifier: Diagnosis of  By: Deborra Medina MD, Tanja Port    . GERD (gastroesophageal reflux disease)   . Herpes   . 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 Hyperlipidemia   . Irregular heart beat     Patient Active Problem List   Diagnosis Date Noted  . Hip fracture ([place]) 05/29/2016  . Chronic pain 02/02/2016  . Compression fracture of L1 lumbar vertebra [facility]) (old) 11/15/2015  . Chronic prescription benzodiazepine use 10/18/2015  . Chronic low back pain (Location of Primary Source of Pain) (Bilateral) (R>L) 10/18/2015  . 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 Lumbar spondylosis 10/18/2015  . Chronic pain of left lower extremity 10/18/2015  . Lumbar facet arthropathy 10/18/2015  . Lumbar facet syndrome (Location of Primary Source of Pain) (Bilateral) (R>L) 10/18/2015  . Chronic lower extremity pain (Location of Secondary source of pain) (Right) 10/18/2015  . Chronic hip pain (Location of  Secondary source of pain) (Right) 10/18/2015  . Osteoarthritis of hip (Right) 10/18/2015  . Chronic constipation 12/03/2009  . Osteopenia 08/17/2009  . Hyperlipidemia 06/29/2009  . Hypokalemia 12/11/2008  . Anxiety and depression 12/11/2008  . Common migraine 12/11/2008  . SVT (supraventricular tachycardia) ([place]) 12/11/2008  . Allergic rhinitis 12/11/2008  . Gastroesophageal reflux disease 12/11/2008  . Generalized osteoarthrosis of hand 12/11/2008  . Osteoporosis 12/11/2008  . Mixed urinary incontinence 12/11/2008  . History of nephrolithiasis 12/11/2008    Past Surgical History:  Procedure Laterality Date  . ABDOMINAL HYSTERECTOMY    . APPENDECTOMY    . BACK SURGERY     X 3  . BREAST EXCISIONAL BIOPSY Right    surgical bx age 9   . COLONOSCOPY N/A 08/28/2013   Procedure: COLONOSCOPY;  Surgeon: Danie Binder, MD;  Location: [facility];  Service: Endoscopy;  Laterality: N/A;  8:30 AM  . INTRAMEDULLARY (IM) NAIL INTERTROCHANTERIC Left 05/30/2016   Procedure: INTRAMEDULLARY (IM) NAIL INTERTROCHANTRIC;  Surgeon: Thornton Park, MD;  Location: [facility] ORS;  Service: Orthopedics;  Laterality: Left;  . SHOULDER SURGERY    . Spinal cord stimulator      Prior to Admission medications   Medication Sig Start Date End Date Taking? Authorizing Provider  atorvastatin (LIPITOR) 40 MG tablet Take by mouth.   Yes Historical Provider, MD  clonazePAM (KLONOPIN) 0.5 MG tablet take 1/2 tablet twice a day and 1/2 to 1 tablet at bedtime 03/20/16  Yes Historical Provider, MD  Garlic [id number] MG CAPS Take 1 capsule by  mouth daily.   Yes Historical Provider, MD  Multiple Vitamin (MULTIVITAMIN) tablet Take 1 tablet by mouth daily.   Yes Historical Provider, MD  pantoprazole (PROTONIX) 40 MG tablet Take 40 mg by mouth 2 (two) times daily.    Yes Historical Provider, MD  QUEtiapine (SEROQUEL) 25 MG tablet Take 1-2 tablets by mouth at bedtime. 05/05/16  Yes Historical Provider, MD  sertraline (ZOLOFT) 50 MG  tablet take 1/2 tablet by mouth once daily for 7 days then 1 tablet once daily THERAFTER 04/03/16  Yes Historical Provider, MD  valACYclovir (VALTREX) 1000 MG tablet Take 1 tablet (1,000 mg total) by mouth daily. 07/31/13  Yes Estill Dooms, NP  buPROPion (WELLBUTRIN XL) 300 MG 24 hr tablet Take 300 mg by mouth daily. 02/22/16   Historical Provider, MD  busPIRone (BUSPAR) 15 MG tablet Take 15 mg by mouth 2 (two) times daily.    Historical Provider, MD  cephALEXin (KEFLEX) 500 MG capsule Take 1 capsule (500 mg total) by mouth 2 (two) times daily. 06/04/16   Epifanio Lesches, MD  diclofenac (VOLTAREN) 75 MG EC tablet Take 75 mg by mouth 2 (two) times daily. 02/09/16   Historical Provider, MD  enoxaparin (LOVENOX) 40 MG/0.4ML injection Inject 0.4 mLs (40 mg total) into the skin daily. 06/03/16 06/24/16  Dustin Flock, MD  midodrine (PROAMATINE) 10 MG tablet Take 1 tablet (10 mg total) by mouth 3 (three) times daily with meals. 06/04/16   Epifanio Lesches, MD  nadolol (CORGARD) 20 MG tablet Take 1 tablet (20 mg total) by mouth daily. 1/2 tablet po daily 06/02/16   Dustin Flock, MD  oxyCODONE (OXY IR/ROXICODONE) 5 MG immediate release tablet Take 1-2 tablets (5-10 mg total) by mouth every 4 (four) hours as needed for breakthrough pain ((for MODERATE breakthrough pain)). 06/02/16   Dustin Flock, MD    Allergies Levofloxacin; Propoxyphene hcl; and Codeine  Family History  Problem Relation Age of Onset  . Diabetes Mother   . COPD Mother   . Heart disease Mother   . Diabetes Father   . COPD Father   . Heart disease Father   . Alcohol abuse Daughter   . Depression Daughter   . Tuberculosis Maternal Aunt   . Cancer Paternal Aunt     breast  . Diabetes Paternal Grandmother   . Diabetes Sister   . Cancer Cousin   . Diabetes Cousin   . Colon cancer Neg Hx     Social History Social History  Substance Use Topics  . Smoking status: Former Smoker    Packs/day: 0.25    Years: 14.00    Types:  Cigarettes  . Smokeless tobacco: Former Systems developer    Quit date: 08/29/1990  . Alcohol use Yes     Comment: occasional    Review of Systems Constitutional: No fever/chills Eyes: No visual changes. ENT: No sore throat. Cardiovascular: Denies chest pain. Respiratory: Denies shortness of breath. Gastrointestinal: No abdominal pain.  No nausea, no vomiting.  No diarrhea.  No constipation. Genitourinary: Negative for dysuria. Musculoskeletal: Negative for back pain.Positive for left hip pain Skin: Negative for rash. Neurological: Negative for headaches, focal weakness or numbness. Positive for syncope  10-point ROS otherwise negative.  ____________________________________________   PHYSICAL EXAM:  VITAL SIGNS: ED Triage Vitals  Enc Vitals Group     BP 05/29/16 0244 (!) 127/105     Pulse Rate 05/29/16 0244 64     Resp 05/29/16 0244 20     Temp 05/29/16 0244 97.5 F (  36.4 C)     Temp Source 05/29/16 0244 Oral     SpO2 05/29/16 0244 100 %     Weight 05/29/16 0245 138 lb (62.6 kg)     Height 05/29/16 0245 5\' 1"  (1.549 m)     Head Circumference --      Peak Flow --      Pain Score 05/29/16 0245 10     Pain Loc --      Pain Edu? --      Excl. in [place]? --     Constitutional: Alert and oriented. Apparent discomfort  Eyes: Conjunctivae are normal. PERRL. EOMI. Head: Atraumatic. Mouth/Throat: Mucous membranes are moist.  Oropharynx non-erythematous. Neck: No stridor.   Cardiovascular: Normal rate, regular rhythm. Good peripheral circulation. Grossly normal heart sounds. Respiratory: Normal respiratory effort.  No retractions. Lungs CTAB. Gastrointestinal: Soft and nontender. No distention.   Musculoskeletal: Left hip pain with palpation. No gross deformities of extremities. Neurologic:  Normal speech and language. No gross focal neurologic deficits are appreciated.  Skin:  Skin is warm, dry and intact. No rash noted. Psychiatric: Mood and affect are normal. Speech and behavior are  normal.  ____________________________________________   LABS (all labs ordered are listed, but only abnormal results are displayed)  Labs Reviewed  SURGICAL PCR SCREEN - Abnormal; Notable for the following:       Result Value   Staphylococcus aureus POSITIVE (*)    All other components within normal limits  URINE CULTURE - Abnormal; Notable for the following:    Culture   (*)    Value: 50,000 COLONIES/mL ESCHERICHIA COLI 30,000 COLONIES/mL ENTEROCOCCUS FAECALIS    Organism ID, Bacteria ESCHERICHIA COLI (*)    Organism ID, Bacteria ENTEROCOCCUS FAECALIS (*)    All other components within normal limits  BASIC METABOLIC PANEL - Abnormal; Notable for the following:    Potassium 3.3 (*)    Glucose, Bld 158 (*)    Calcium 8.6 (*)    All other components within normal limits  CBC - Abnormal; Notable for the following:    RBC 3.56 (*)    MCV 101.3 (*)    MCH 36.2 (*)    All other components within normal limits  TSH - Abnormal; Notable for the following:    TSH 5.193 (*)    All other components within normal limits  CBC - Abnormal; Notable for the following:    WBC 11.8 (*)    RBC 2.54 (*)    Hemoglobin 9.4 (*)    HCT 25.9 (*)    MCV 101.7 (*)    MCH 36.9 (*)    MCHC 36.3 (*)    All other components within normal limits  BASIC METABOLIC PANEL - Abnormal; Notable for the following:    Glucose, Bld 132 (*)    BUN <5 (*)    Creatinine, Ser 0.37 (*)    Calcium 8.1 (*)    All other components within normal limits  CBC - Abnormal; Notable for the following:    RBC 2.32 (*)    Hemoglobin 8.4 (*)    HCT 23.8 (*)    MCV 102.5 (*)    MCH 36.3 (*)    All other components within normal limits  BASIC METABOLIC PANEL - Abnormal; Notable for the following:    Potassium 2.9 (*)    Glucose, Bld 120 (*)    BUN <5 (*)    Creatinine, Ser 0.34 (*)    Calcium 7.6 (*)    Anion  gap 3 (*)    All other components within normal limits  CBC - Abnormal; Notable for the following:    RBC  3.06 (*)    Hemoglobin 10.9 (*)    HCT 30.3 (*)    MCH 35.5 (*)    All other components within normal limits  BASIC METABOLIC PANEL - Abnormal; Notable for the following:    Glucose, Bld 118 (*)    Creatinine, Ser 0.43 (*)    Calcium 8.0 (*)    Anion gap 4 (*)    All other components within normal limits  URINALYSIS, ROUTINE W REFLEX MICROSCOPIC - Abnormal; Notable for the following:    Color, Urine YELLOW (*)    APPearance HAZY (*)    Leukocytes, UA SMALL (*)    Bacteria, UA RARE (*)    Squamous Epithelial / LPF 6-30 (*)    All other components within normal limits  TROPONIN I  HEMOGLOBIN A1C  CORTISOL  TYPE AND SCREEN  PREPARE RBC (CROSSMATCH)  ABO/RH   ____________________________________________  EKG   RADIOLOGY I, Jericho N Narcisa Ganesh, personally viewed and evaluated these images (plain radiographs) as part of my medical decision making, as well as reviewing the written report by the radiologist.  _CLINICAL DATA:  Fall with left hip pain  EXAM: DG HIP (WITH OR WITHOUT PELVIS) 2-3V LEFT  COMPARISON:  10/18/2015  FINDINGS: Left lower quadrant generator again noted. The right femoral head appears normally positioned and there are degenerative changes of the right hip as before.  Pubic symphysis appears intact. Mildly comminuted intertrochanteric fracture of the proximal left femur are with mild varus angulation. Left femoral head projects in joint.  IMPRESSION: Acute mildly angulated left intertrochanteric fracture.   Electronically Signed   By: [name] M.D.   On: 05/29/2016 03:35 ___________________________________________   Procedures    INITIAL IMPRESSION / ASSESSMENT AND PLAN / ED COURSE  Pertinent labs & imaging results that were available during my care of the patient were reviewed by me and considered in my medical decision making (see chart for details).     Clinical Course      ____________________________________________  FINAL CLINICAL IMPRESSION(S) / ED DIAGNOSES  Syncope Left intertrochanteric hip fracture   MEDICATIONS GIVEN DURING THIS VISIT:  Medications  sodium chloride 0.9 % bolus 1,000 mL (0 mLs Intravenous Stopped 05/29/16 0507)  morphine 4 MG/ML injection 4 mg (4 mg Intravenous Given 05/29/16 0304)  ondansetron (ZOFRAN) injection 4 mg (4 mg Intravenous Given 05/29/16 0304)  HYDROmorphone (DILAUDID) injection 0.5 mg (0.5 mg Intravenous Given 05/29/16 0422)  HYDROmorphone (DILAUDID) injection 1 mg (1 mg Intravenous Given 05/29/16 0500)  ceFAZolin (ANCEF) IVPB 2g/100 mL premix (2 g Intravenous Given 05/30/16 2145)  0.9 %  sodium chloride infusion ( Intravenous New Bag/Given 06/01/16 1652)  acetaminophen (TYLENOL) tablet 650 mg (650 mg Oral Given 06/01/16 1642)  potassium chloride SA (K-DUR,KLOR-CON) CR tablet 40 mEq (40 mEq Oral Given 06/01/16 2121)  iopamidol (ISOVUE-370) 76 % injection 75 mL (75 mLs Intravenous Contrast Given 06/02/16 1457)  sodium chloride 0.9 % bolus 500 mL (500 mLs Intravenous Given 06/03/16 1948)     NEW OUTPATIENT MEDICATIONS STARTED DURING THIS VISIT:  Discharge Medication List as of 06/04/2016  1:26 PM    START taking these medications   Details  cephALEXin (KEFLEX) 500 MG capsule Take 1 capsule (500 mg total) by mouth 2 (two) times daily., Starting Sun 06/04/2016, Normal    enoxaparin (LOVENOX) 40 MG/0.4ML injection Inject 0.4 mLs (40  mg total) into the skin daily., Starting Sat 06/03/2016, Until Sat 06/24/2016, No Print    oxyCODONE (OXY IR/ROXICODONE) 5 MG immediate release tablet Take 1-2 tablets (5-10 mg total) by mouth every 4 (four) hours as needed for breakthrough pain ((for MODERATE breakthrough pain))., Starting Fri 06/02/2016, Print        Discharge Medication List as of 06/04/2016  1:26 PM    CONTINUE these medications which have CHANGED   Details  midodrine (PROAMATINE) 10 MG tablet Take 1 tablet (10 mg total) by mouth 3  (three) times daily with meals., Starting Sun 06/04/2016, Normal    nadolol (CORGARD) 20 MG tablet Take 1 tablet (20 mg total) by mouth daily. 1/2 tablet po daily, Starting Fri 06/02/2016, No Print        Discharge Medication List as of 06/04/2016  1:26 PM    STOP taking these medications     prazosin (MINIPRESS) 2 MG capsule Comments:  Reason for Stopping:           Note:  This document was prepared using Dragon voice recognition software and may include unintentional dictation errors.    Gregor Hams, MD 06/11/16 [phone]

## 2016-06-28 ENCOUNTER — Encounter: Payer: Self-pay | Admitting: Internal Medicine

## 2016-06-28 ENCOUNTER — Ambulatory Visit (INDEPENDENT_AMBULATORY_CARE_PROVIDER_SITE_OTHER): Payer: Medicare PPO

## 2016-06-28 ENCOUNTER — Ambulatory Visit (INDEPENDENT_AMBULATORY_CARE_PROVIDER_SITE_OTHER): Payer: Medicare PPO | Admitting: Internal Medicine

## 2016-06-28 VITALS — BP 120/74 | HR 67 | Ht 61.0 in | Wt 122.8 lb

## 2016-06-28 DIAGNOSIS — I951 Orthostatic hypotension: Secondary | ICD-10-CM | POA: Diagnosis not present

## 2016-06-28 DIAGNOSIS — R42 Dizziness and giddiness: Secondary | ICD-10-CM | POA: Diagnosis not present

## 2016-06-28 DIAGNOSIS — W19XXXA Unspecified fall, initial encounter: Secondary | ICD-10-CM | POA: Diagnosis not present

## 2016-06-28 DIAGNOSIS — E782 Mixed hyperlipidemia: Secondary | ICD-10-CM | POA: Diagnosis not present

## 2016-06-28 NOTE — Progress Notes (Signed)
New Outpatient Visit Date: 06/28/2016  Referring Provider: Herminio Commons, MD [address] [place], [place] [address]  Chief Complaint: lightheadedness  HPI:  Wanda [name] is a 61 y.o. year-old female with history of hyperlipidemia and irregular heart beat, who has been referred by Dr. Gwynneth Aliment for evaluation of lightheadedness and fall in the setting of orthosttic hypotension.  The patient reports getting up in the early morning hours of 05/29/16 to void.  After standing, she became extremely lightheaded.  She was unable to reach something to hold onto and fell to the ground with a twisting motion, leading to an intertrochanteric fracture of the left femur.  She did not have any warning signs such as palpitations, chest pain, or shortness of breath.  She notes that she had a UTI at the time and was experiencing significant urinary urgency.  She has a history of mild orthostatic lightheadedness in the past, but had never fallen as a result of it.  She did fracture her wrist last summer following a mechanical fall.  The patient underwent surgical repair of the left hip and continues to undergo physical therapy.  In the hospital, she was noted to have significant orthostatic hypotension, which persisted despite discontinuation of prazosin.  Nadolol (which was originally started for "irregular heart beats" many years ago) was also decreased.  She was also discharged on standing midodrine.  Since her fall, she has not had any further episodes of orthostatic lightheadedness, though she has been much more careful about standing up slowly.  Wanda [name] reports occasional palpitations in the past, which she describes as a brief flutter.  She hs been trying to stay well-hydrating, drinking at least four 16 oz bottles of water/day.  She denies orthopnea, PND, and edema.  She consumes 1 cup of coffee per day.  The patient reports intermittent dark, tarry stool and is awaiting GI evaluation, which was  delayed by her fall and left hip fracture.  She denied BRBPR.  --------------------------------------------------------------------------------------------------  Cardiovascular History & Procedures: Cardiovascular Problems:  Orthostatic lightheadedness/fall  Risk Factors:  Hyperlipidemia  Cath/PCI:  None  CV Surgery:  None  EP Procedures and Devices:  None  Non-Invasive Evaluation(s):  Bilateral carotid Doppler (05/31/16): Mild bilateral carotid atherosclerosis (<50%).  Antegrade vertebral artery flow bilaterally.  TTE (05/29/16): Technically difficult study.  Normal LV size and function (LVEF 60-65%).  No significant valvular abnormalities (though suboptimally visualized).  Normal RV size and function.  Recent CV Pertinent Labs: Lab Results  Component Value Date   CHOL 267 (H) 02/01/2010   HDL 40.20 02/01/2010   LDLCALC 167 06/29/2009   LDLCALC 167 06/29/2009   LDLDIRECT 197.3 02/01/2010   TRIG 184.0 (H) 02/01/2010   CHOLHDL 7 02/01/2010   K 3.9 06/02/2016   K 3.8 12/04/2012   BUN 6 06/02/2016   BUN 16 12/04/2012   CREATININE 0.43 (L) 06/02/2016   CREATININE 0.64 12/04/2012    --------------------------------------------------------------------------------------------------  Past Medical History:  Diagnosis Date  . Anxiety   . BRONCHITIS, ACUTE WITH BRONCHOSPASM 06/25/2010   Qualifier: Diagnosis of  By: Alveta Heimlich MD, Cornelia Copa    . Depression   . GASTROENTERITIS WITHOUT DEHYDRATION 05/14/2009   Qualifier: Diagnosis of  By: Deborra Medina MD, Tanja Port    . GERD (gastroesophageal reflux disease)   . Herpes   . 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 Hyperlipidemia   . Irregular heart beat     Past Surgical History:  Procedure Laterality Date  . ABDOMINAL  HYSTERECTOMY    . APPENDECTOMY    . BACK SURGERY     X 3  . BREAST EXCISIONAL BIOPSY Right    surgical bx age 67   . COLONOSCOPY N/A 08/28/2013   Procedure: COLONOSCOPY;  Surgeon: Danie Binder, MD;  [facility];  Service: Endoscopy;  Laterality: N/A;  8:30 AM  . INTRAMEDULLARY (IM) NAIL INTERTROCHANTERIC Left 05/30/2016   Procedure: INTRAMEDULLARY (IM) NAIL INTERTROCHANTRIC;  Surgeon: Thornton Park, MD;  Location: [facility];  Service: Orthopedics;  Laterality: Left;  . SHOULDER SURGERY    . Spinal cord stimulator      Outpatient Encounter Prescriptions as of 06/28/2016  Medication Sig  . aspirin EC 81 MG tablet Take 81 mg by mouth daily.  Marland Kitchen atorvastatin (LIPITOR) 40 MG tablet Take by mouth.  . Cholecalciferol (VITAMIN D3) 5000 units TABS Take 5,000 Units by mouth daily.  . clonazePAM (KLONOPIN) 0.5 MG tablet take 1/2 tablet twice a day and 1/2 to 1 tablet at bedtime  . Garlic [id number] MG CAPS Take 1 capsule by mouth daily.  . midodrine (PROAMATINE) 10 MG tablet Take 1 tablet (10 mg total) by mouth 3 (three) times daily with meals.  . Multiple Vitamin (MULTIVITAMIN) tablet Take 1 tablet by mouth daily.  . nadolol (CORGARD) 20 MG tablet Take 1 tablet (20 mg total) by mouth daily. 1/2 tablet po daily  . oxyCODONE (OXY IR/ROXICODONE) 5 MG immediate release tablet Take 1-2 tablets (5-10 mg total) by mouth every 4 (four) hours as needed for breakthrough pain ((for MODERATE breakthrough pain)).  . pantoprazole (PROTONIX) 40 MG tablet Take 40 mg by mouth 2 (two) times daily.   . QUEtiapine (SEROQUEL) 25 MG tablet Take 1-2 tablets by mouth at bedtime.  . sertraline (ZOLOFT) 50 MG tablet take 1/2 tablet by mouth once daily for 7 days then 1 tablet once daily THERAFTER  . valACYclovir (VALTREX) 1000 MG tablet Take 1 tablet (1,000 mg total) by mouth daily.  . [DISCONTINUED] buPROPion (WELLBUTRIN XL) 300 MG 24 hr tablet Take 300 mg by mouth daily.  . [DISCONTINUED] busPIRone (BUSPAR) 15 MG tablet Take 15 mg by mouth 2 (two) times daily.  . [DISCONTINUED] cephALEXin (KEFLEX) 500 MG capsule Take 1 capsule (500 mg total) by mouth 2 (two) times daily. (Patient not taking: Reported on  06/28/2016)  . [DISCONTINUED] diclofenac (VOLTAREN) 75 MG EC tablet Take 75 mg by mouth 2 (two) times daily.  . [DISCONTINUED] enoxaparin (LOVENOX) 40 MG/0.4ML injection Inject 0.4 mLs (40 mg total) into the skin daily.  . [DISCONTINUED] Vitamin D, Ergocalciferol, (DRISDOL) 50000 units CAPS capsule Take 50,000 Units by mouth every 7 (seven) days.   No facility-administered encounter medications on file as of 06/28/2016.     Allergies: Levofloxacin; Propoxyphene hcl; and Codeine  Social History   Social History  . Marital status: Single    Spouse name: N/A  . Number of children: N/A  . Years of education: N/A   Occupational History  . Not on file.   Social History Main Topics  . Smoking status: Former Smoker    Packs/day: 0.25    Years: 14.00    Types: Cigarettes  . Smokeless tobacco: Former Systems developer    Quit date: 08/29/1990  . Alcohol use Yes     Comment: occasional  . Drug use: No  . Sexual activity: Yes    Birth control/ protection: Surgical   Other Topics Concern  . Not on file   Social History Narrative  .  No narrative on file    Family History  Problem Relation Age of Onset  . Diabetes Mother   . COPD Mother   . Heart disease Mother   . Diabetes Father   . COPD Father   . Heart disease Father   . Alcohol abuse Daughter   . Depression Daughter   . Tuberculosis Maternal Aunt   . Cancer Paternal Aunt     breast  . Diabetes Paternal Grandmother   . Diabetes Sister   . Cancer Cousin   . Diabetes Cousin   . Colon cancer Neg Hx     Review of Systems: A 12-system review of systems was performed and was negative except as noted in the HPI.  --------------------------------------------------------------------------------------------------  Physical Exam: BP 120/74 (BP Location: Right Arm, Patient Position: Sitting, Cuff Size: Normal)   Pulse 67   Ht 5\' 1"  (1.549 m)   Wt 122 lb 12 oz (55.7 kg)   BMI 23.19 kg/m  Position Blood pressure (mmHg) Heart rate (bpm)   Lying 137/85 66  Sitting 132/84 69  Standing 100/70 83  Standing (3 minutes) 100/72 84    General:  Well-developed, well-nourished woman, seated comfortably in the exam room. HEENT: No conjunctival pallor or scleral icterus.  Moist mucous membranes.  OP clear. Neck: Supple without lymphadenopathy, thyromegaly, JVD, or HJR.  No carotid bruit. Lungs: Normal work of breathing.  Clear to auscultation bilaterally without wheezes or crackles. Heart: Regular rate and rhythm without murmurs, rubs, or gallops.  Non-displaced PMI. Abd: Bowel sounds present.  Soft, NT/ND without hepatosplenomegaly Ext: No lower extremity edema.  Radial, PT, and DP pulses are 2+ bilaterally Skin: warm and dry without rash Neuro: CNIII-XII intact.  Strength and fine-touch sensation intact in upper and lower extremities bilaterally. Psych: Normal mood and affect.  EKG:  Normal sinus rhythm without significant abnormalities.  Lab Results  Component Value Date   WBC 8.2 06/02/2016   HGB 10.9 (L) 06/02/2016   HCT 30.3 (L) 06/02/2016   MCV 98.9 06/02/2016   PLT 196 06/02/2016    Lab Results  Component Value Date   NA 144 06/02/2016   K 3.9 06/02/2016   CL 111 06/02/2016   CO2 29 06/02/2016   BUN 6 06/02/2016   CREATININE 0.43 (L) 06/02/2016   GLUCOSE 118 (H) 06/02/2016   ALT 17 02/01/2010    Lab Results  Component Value Date   CHOL 267 (H) 02/01/2010   HDL 40.20 02/01/2010   LDLCALC 167 06/29/2009   LDLCALC 167 06/29/2009   LDLDIRECT 197.3 02/01/2010   TRIG 184.0 (H) 02/01/2010   CHOLHDL 7 02/01/2010    --------------------------------------------------------------------------------------------------  ASSESSMENT AND PLAN: Orthostatic hypotension and lightheadedness Vital signs today continue to demonstrate significant orthostatic blood pressure drop and heart rate increase.  I suspect this is likely multifactorial, including possible intravascular volume depletion, medication effects, and  possible autonomic dysfunction.  Recent echocardiogram and exam today are unremarkable.  We have agreed to discontinue nadolol.  The patient should stay well-hydrated and can also liberalize her salt intake.  I would like to wean her off midodrine in the future, but given continued orthostatic hypotension, we will not make any changes today.  The patient was counseled to stand up slowly and to avoid activities (such as climbing ladders) that could result in serious injury if she were to pass out or lose her balance.  I also encouraged the patient to speak with her PCP and psychiatrist about her psychiatric medications.  Seroquel in  particular has been associated with orthostatic hypotension.  Fall Most likely due to transient cerebral hypoperfusion from orthostatic hypotension, as outlined above.  However, given the patient's history of an "irregular heart beat," we have agreed to obtain an ambulatory event monitor to evaluate for arrhythmias that may have contributed to her fall on 05/29/16.  Hyperlipidemia Patient currently on high-intensity statin, which is reasonable given evidence of atherosclerosis on carotid Doppler (albeit non-obstructive).  I will defer continued management to her PCP.  Follow-up: Return to clinic in 1 month.  Nelva Bush, MD 06/28/2016 11:41 AM

## 2016-06-28 NOTE — Patient Instructions (Signed)
Medication Instructions:  Your physician has recommended you make the following change in your medication:  1- STOP taking your Nadolol.   Labwork: none  Testing/Procedures: Your physician has recommended that you wear an 14 DAY ZIO event monitor. Event monitors are medical devices that record the heart's electrical activity. Doctors most often [place] these monitors to diagnose arrhythmias. Arrhythmias are problems with the speed or rhythm of the heartbeat. The monitor is a small, portable device. You can wear one while you do your normal daily activities. This is usually used to diagnose what is causing palpitations/syncope (passing out).  REMOVE ON 07/12/16.  Follow-Up: Your physician recommends that you schedule a follow-up appointment in: [place].   Dr End recommends you increase your fluid intake. It is suggested to drink at least eight 8-oz glasses of water per day. Dr End also recommends you increase your intake of salt.  If you need a refill on your cardiac medications before your next appointment, please call your pharmacy.

## 2016-07-12 DIAGNOSIS — R42 Dizziness and giddiness: Secondary | ICD-10-CM

## 2016-07-19 ENCOUNTER — Telehealth: Payer: Self-pay | Admitting: Internal Medicine

## 2016-07-19 MED ORDER — MIDODRINE HCL 10 MG PO TABS: 5.0000 mg | ORAL_TABLET | Freq: Three times a day (TID) | ORAL | Status: DC

## 2016-07-19 NOTE — Telephone Encounter (Signed)
I spoke with the patient regarding the results of her 14-day event monitor, demonstrating rare ventricular and supraventricular ectopy as well as a few episodes of brief SVT. Overall, she feels a little bit better, though she continues to have some palpitations and orthostatic lightheadedness. We have discussed further options including no medication changes, decreasing midodrine, and adding back a beta blocker. We have agreed to decrease midodrine to 5 mg 3 times a day. The patient should continue to remain well-hydrated. We will see her back for follow-up as scheduled next week.  Nelva Bush, MD [facility] Pager: [phone]

## 2016-07-26 ENCOUNTER — Ambulatory Visit (INDEPENDENT_AMBULATORY_CARE_PROVIDER_SITE_OTHER): Payer: Medicare PPO | Admitting: Cardiovascular Disease

## 2016-07-26 ENCOUNTER — Encounter: Payer: Self-pay | Admitting: Internal Medicine

## 2016-07-26 VITALS — BP 110/70 | HR 98 | Ht 61.0 in | Wt 132.8 lb

## 2016-07-26 DIAGNOSIS — N390 Urinary tract infection, site not specified: Secondary | ICD-10-CM

## 2016-07-26 DIAGNOSIS — I951 Orthostatic hypotension: Secondary | ICD-10-CM | POA: Diagnosis not present

## 2016-07-26 DIAGNOSIS — S72009S Fracture of unspecified part of neck of unspecified femur, sequela: Secondary | ICD-10-CM | POA: Diagnosis not present

## 2016-07-26 DIAGNOSIS — R42 Dizziness and giddiness: Secondary | ICD-10-CM

## 2016-07-26 MED ORDER — MIDODRINE HCL 10 MG PO TABS: 10.0000 mg | ORAL_TABLET | Freq: Three times a day (TID) | ORAL | 6 refills | Status: DC

## 2016-07-26 NOTE — Patient Instructions (Addendum)
Medication Instructions:   Please increase the midodrine up to 10 mg three times a day Your blood pressure is still dropping 40 points when you stand  I will talk with WandaEnd about other treatment options  Consider wearing your back brace for dizzy spells Compression hose if able to put them on  Labwork:  No new labs needed  Testing/Procedures:  No further testing at this time   I recommend watching educational videos on topics of interest to you at:       [email or web address]  Enter code: [email or web address]    Follow-Up: It was a pleasure seeing you in the office today. Please call us if you have new issues that need to be addressed before your next appt.  [phone]  Your physician wants you to follow-up in: 2 to 3 weeks with Wanda [name]   If you need a refill on your cardiac medications before your next appointment, please call your pharmacy.
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
## 2016-08-04 ENCOUNTER — Telehealth: Payer: Self-pay | Admitting: Internal Medicine

## 2016-08-04 ENCOUNTER — Other Ambulatory Visit: Payer: Self-pay | Admitting: *Deleted

## 2016-08-04 MED ORDER — MIDODRINE HCL 10 MG PO TABS: 10.0000 mg | ORAL_TABLET | Freq: Three times a day (TID) | ORAL | 3 refills | Status: DC

## 2016-08-04 NOTE — Telephone Encounter (Signed)
Requested Prescriptions   Signed Prescriptions Disp Refills  . midodrine (PROAMATINE) 10 MG tablet 270 tablet 3    Sig: Take 1 tablet (10 mg total) by mouth 3 (three) times daily.    Authorizing Provider: Minna Merritts    Ordering User: Britt Bottom

## 2016-08-04 NOTE — Telephone Encounter (Signed)
°*  STAT* If patient is at the pharmacy, call can be transferred to refill team.   1. Which medications need to be refilled? (please list name of each medication and dose if known)  Midodrine 10 mg 3 times a day   2. Which pharmacy/location (including street and city if local pharmacy) is medication to be sent to? [facility] on [address]   3. Do they need a 30 day or 90 day supply? 90 day

## 2016-08-09 ENCOUNTER — Encounter: Payer: Self-pay | Admitting: Internal Medicine

## 2016-08-09 ENCOUNTER — Ambulatory Visit (INDEPENDENT_AMBULATORY_CARE_PROVIDER_SITE_OTHER): Payer: Medicare PPO | Admitting: Internal Medicine

## 2016-08-09 VITALS — BP 100/80 | HR 94 | Ht 61.0 in | Wt 130.5 lb

## 2016-08-09 DIAGNOSIS — M25562 Pain in left knee: Secondary | ICD-10-CM | POA: Diagnosis not present

## 2016-08-09 DIAGNOSIS — I951 Orthostatic hypotension: Secondary | ICD-10-CM

## 2016-08-09 DIAGNOSIS — I471 Supraventricular tachycardia: Secondary | ICD-10-CM

## 2016-08-09 MED ORDER — MIDODRINE HCL 5 MG PO TABS: 5.0000 mg | ORAL_TABLET | Freq: Three times a day (TID) | ORAL | 3 refills | Status: DC

## 2016-08-09 MED ORDER — FLUDROCORTISONE ACETATE 0.1 MG PO TABS: 0.1000 mg | ORAL_TABLET | Freq: Every day | ORAL | 3 refills | Status: DC

## 2016-08-09 NOTE — Progress Notes (Signed)
Follow-up Outpatient Visit Date: 08/09/2016  Primary Care Provider: [name], [place] [address]  Chief Complaint: Follow-up orthostatic hypotension and lightheadedness  HPI:  Wanda [name] is a 61 y.o. year-old female with history of hyperlipidemia and irregular heartbeat, who presents for follow-up of orthostatic hypotension and lightheadedness. I last saw her on 06/28/16, which time she continued to have some orthostatic lightheadedness and evidence of orthostatic hypotension on exam. We obtained a 14 day event monitor, which demonstrated per normally sinus rhythm with episodes of paroxysmal supraventricular tachycardia. We agreed to decrease midodrine to 5 mg . At follow-up, she denied additional lightheadedness or dizziness but was noted to still have a borderline significant drop in blood pressure from sitting to standing. She was put back on 10 mg 3 times a day by Dr. Rockey Situ at that time.  Today, Wanda [name] complains only of pain involving the left knee. She noted a "pop" during physical therapy and has subsequently felt a lump below the patella. She now has to wear a brace or wrap the knee to relieve the pain. She notes minimal lightheadedness since our last visit. It continues to be orthostatic but only lasts 1-2 seconds. She has not had any falls or loss of consciousness related to this. She stumbled over her rolling walker yesterday and fell against the closet door. She did not have any injuries. She notes occasional thoracic back pain, which is worst when lying in bed at night. It does not worsen with exertion. She denies substernal chest pain, shortness of breath, and edema. She has been drinking several bottles of water per day and also wears compression stockings on a regular basis. Her home blood pressure readings show continued orthostatic blood pressure drops of less than 20 mmHg systolic. However, she has notable tachycardia with standing  (though she remains asymptomatic).  -------------------------------------------------------------------------------------------------- Cardiovascular History & Procedures: Cardiovascular Problems:  Orthostatic lightheadedness/fall   Risk Factors:  Hyperlipidemia  Cath/PCI:  None  CV Surgery:  None  EP Procedures and Devices:  Cardiac event monitor (06/28/16): Predominantly sinus rhythm with rare PACs and PVCs, as well as paroxysmal SVT (lasting up to 10.8 seconds).  Non-Invasive Evaluation(s):  Bilateral carotid Doppler (05/31/16): Mild bilateral carotid atherosclerosis (<50%).  Antegrade vertebral artery flow bilaterally.  TTE (05/29/16): Technically difficult study.  Normal LV size and function (LVEF 60-65%).  No significant valvular abnormalities (though suboptimally visualized).  Normal RV size and function.  Recent CV Pertinent Labs: Lab Results  Component Value Date   CHOL 267 (H) 02/01/2010   HDL 40.20 02/01/2010   LDLCALC 167 06/29/2009   LDLCALC 167 06/29/2009   LDLDIRECT 197.3 02/01/2010   TRIG 184.0 (H) 02/01/2010   CHOLHDL 7 02/01/2010   K 3.9 06/02/2016   K 3.8 12/04/2012   BUN 6 06/02/2016   BUN 16 12/04/2012   CREATININE 0.43 (L) 06/02/2016   CREATININE 0.64 12/04/2012    Past medical and surgical history were reviewed and updated in EPIC.  Outpatient Encounter Prescriptions as of 08/09/2016  Medication Sig  . aspirin EC 81 MG tablet Take 81 mg by mouth daily.  Marland Kitchen atorvastatin (LIPITOR) 40 MG tablet Take by mouth.  . Cholecalciferol (VITAMIN D3) 5000 units TABS Take 5,000 Units by mouth daily.  . clonazePAM (KLONOPIN) 0.5 MG tablet take 1/2 tablet twice a day and 1/2 to 1 tablet at bedtime  . Garlic 7619 MG CAPS Take 1 capsule by mouth daily.  . midodrine (PROAMATINE) 10 MG  tablet Take 1 tablet (10 mg total) by mouth 3 (three) times daily.  . Multiple Vitamin (MULTIVITAMIN) tablet Take 1 tablet by mouth daily.  . pantoprazole (PROTONIX) 40 MG  tablet Take 40 mg by mouth 2 (two) times daily.   . QUEtiapine (SEROQUEL) 25 MG tablet Take 1-2 tablets by mouth at bedtime.  . sertraline (ZOLOFT) 50 MG tablet take 1/2 tablet by mouth once daily for 7 days then 1 tablet once daily THERAFTER  . valACYclovir (VALTREX) 1000 MG tablet Take 1 tablet (1,000 mg total) by mouth daily.   No facility-administered encounter medications on file as of 08/09/2016.     Allergies: Levofloxacin; Propoxyphene hcl; and Codeine  Social History   Social History  . Marital status: Single    Spouse name: N/A  . Number of children: N/A  . Years of education: N/A   Occupational History  . Not on file.   Social History Main Topics  . Smoking status: Former Smoker    Packs/day: 0.25    Years: 14.00    Types: Cigarettes    Quit date: 75  . Smokeless tobacco: Former Systems developer    Quit date: 08/29/1990  . Alcohol use Yes     Comment: occasional; once every 6 months  . Drug use: No  . Sexual activity: Yes    Birth control/ protection: Surgical   Other Topics Concern  . Not on file   Social History Narrative  . No narrative on file    Family History  Problem Relation Age of Onset  . Diabetes Mother   . COPD Mother   . Heart disease Mother   . Diabetes Father   . COPD Father   . Heart disease Father   . Heart attack Father 53    CABG  . Alcohol abuse Daughter   . Depression Daughter   . Tuberculosis Maternal Aunt   . Cancer Paternal Aunt     breast  . Diabetes Paternal Grandmother   . Diabetes Sister   . Cancer Cousin   . Diabetes Cousin   . Colon cancer Neg Hx   . Sudden Cardiac Death Neg Hx     Review of Systems: A 12-system review of systems was performed and was negative except as noted in the HPI.  --------------------------------------------------------------------------------------------------  Physical Exam: BP 100/80 (BP Location: Left Arm, Patient Position: Sitting, Cuff Size: Normal)   Pulse 94   Ht 5\' 1"  (1.549 m)   Wt  130 lb 8 oz (59.2 kg)   BMI 24.66 kg/m  Position Blood pressure (mmHg) Heart rate (bpm)  Lying 112/80 87  Sitting 100/80 98  Standing 102/76 112  Standing (3 minutes) 112/78 120   General:  Well-developed, well-nourished woman, seated comfortably in the exam room. HEENT: No conjunctival pallor or scleral icterus.  Moist mucous membranes.  OP clear. Neck: Supple without lymphadenopathy, thyromegaly, JVD, or HJR.  No carotid bruit. Lungs: Normal work of breathing.  Clear to auscultation bilaterally without wheezes or crackles. Heart: Regular rate and rhythm without murmurs, rubs, or gallops.  Non-displaced PMI. Abd: Bowel sounds present.  Soft, NT/ND without hepatosplenomegaly Ext: No lower extremity edema with compression stockings in place.  Radial, PT, and DP pulses are 2+ bilaterally. Firmness noted just below left patella with mild tenderness. No overlying erythema or wound. Skin: warm and dry without rash  EKG:  Normal sinus rhythm with short PR interval and non-specific ST changes. No significant change from prior tracing on 07/26/16 (I have personally  reviewed both tracings).  Lab Results  Component Value Date   WBC 8.2 06/02/2016   HGB 10.9 (L) 06/02/2016   HCT 30.3 (L) 06/02/2016   MCV 98.9 06/02/2016   PLT 196 06/02/2016    Lab Results  Component Value Date   NA 144 06/02/2016   K 3.9 06/02/2016   CL 111 06/02/2016   CO2 29 06/02/2016   BUN 6 06/02/2016   CREATININE 0.43 (L) 06/02/2016   GLUCOSE 118 (H) 06/02/2016   ALT 17 02/01/2010    Lab Results  Component Value Date   CHOL 267 (H) 02/01/2010   HDL 40.20 02/01/2010   LDLCALC 167 06/29/2009   LDLCALC 167 06/29/2009   LDLDIRECT 197.3 02/01/2010   TRIG 184.0 (H) 02/01/2010   CHOLHDL 7 02/01/2010   --------------------------------------------------------------------------------------------------  ASSESSMENT AND PLAN: Orthostatic hypotension Overall, orthostatic hypotension has improved, though vital signs  at home and in the office today demonstrate a marked heart rate response. The patient did not have any change in symptoms with increased dose of midodrine. We will start fludrocortisone 0.1 mg daily and decrease midodrine to 5 mg TID, with the ultimate hope of discontinuing midodrine. We will check a BMP and TSH today to ensure stable renal function and electrolytes, as well as to see if her TSH has normalized (it was mildly elevated on last check in January). Of note, recent repeat urine culture by PCP showed <10,000 CFU bacteria in urine, consistent with cleared UTI.  Paroxysmal SVT Monitor showed brief episodes of SVT. EKG today shows NSR. If her orthostatic hypotension improves, we will consider restarting a beta-blocker.  Left knee pain Patient noted pop and subsequent pain in the left knee. I have encouraged her to follow-up with her orthopedist.  Follow-up: Return to clinic in 6 weeks.  Nelva Bush, MD 08/10/2016 12:03 PM

## 2016-08-09 NOTE — Patient Instructions (Signed)
Medication Instructions:  Your physician has recommended you make the following change in your medication:  1- DECREASE Midodrine to 5 mg by mouth three times a day. 2- START Florinef 0.1 mg (1 tablet) by mouth once a day.   Labwork: Your physician recommends that you return for lab work in: TODAY (BMP, TSH, Mg).   Testing/Procedures: NONE  Follow-Up: Your physician recommends that you schedule a follow-up appointment in: [place].  If you need a refill on your cardiac medications before your next appointment, please call your pharmacy.

## 2016-08-10 ENCOUNTER — Other Ambulatory Visit: Payer: Self-pay | Admitting: *Deleted

## 2016-08-10 ENCOUNTER — Encounter: Payer: Self-pay | Admitting: Internal Medicine

## 2016-08-10 DIAGNOSIS — R42 Dizziness and giddiness: Secondary | ICD-10-CM | POA: Insufficient documentation

## 2016-08-10 DIAGNOSIS — I951 Orthostatic hypotension: Secondary | ICD-10-CM | POA: Insufficient documentation

## 2016-08-10 LAB — MAGNESIUM: MAGNESIUM: 2.1 mg/dL (ref 1.6–2.3)

## 2016-08-10 LAB — BASIC METABOLIC PANEL
BUN/Creatinine Ratio: 7 — ABNORMAL LOW (ref 12–28)
BUN: 5 mg/dL — AB (ref 8–27)
CALCIUM: 10.2 mg/dL (ref 8.7–10.3)
CHLORIDE: 101 mmol/L (ref 96–106)
CO2: 23 mmol/L (ref 18–29)
Creatinine, Ser: 0.68 mg/dL (ref 0.57–1.00)
GFR, EST AFRICAN AMERICAN: 110 mL/min/{1.73_m2} (ref >59)
GFR, EST NON AFRICAN AMERICAN: 95 mL/min/{1.73_m2} (ref >59)
GLUCOSE: 93 mg/dL (ref 65–99)
Potassium: 3.4 mmol/L — ABNORMAL LOW (ref 3.5–5.2)
Sodium: 141 mmol/L (ref 134–144)

## 2016-08-10 LAB — TSH: TSH: 1.33 u[IU]/mL (ref 0.450–4.500)

## 2016-08-10 MED ORDER — POTASSIUM CHLORIDE CRYS ER 20 MEQ PO TBCR: 40.0000 meq | EXTENDED_RELEASE_TABLET | Freq: Every day | ORAL | 4 refills | Status: DC

## 2016-08-14 ENCOUNTER — Other Ambulatory Visit: Payer: Self-pay | Admitting: *Deleted

## 2016-08-14 DIAGNOSIS — I951 Orthostatic hypotension: Secondary | ICD-10-CM

## 2016-08-14 DIAGNOSIS — Z79899 Other long term (current) drug therapy: Secondary | ICD-10-CM

## 2016-08-14 DIAGNOSIS — E876 Hypokalemia: Secondary | ICD-10-CM

## 2016-08-15 LAB — SPECIMEN STATUS REPORT

## 2016-08-15 LAB — HEPATIC FUNCTION PANEL
ALBUMIN: 4.6 g/dL (ref 3.6–4.8)
ALT: 29 IU/L (ref 0–32)
AST: 36 IU/L (ref 0–40)
Alkaline Phosphatase: 84 IU/L (ref 39–117)
BILIRUBIN, DIRECT: 0.06 mg/dL (ref 0.00–0.40)
Total Protein: 7.6 g/dL (ref 6.0–8.5)

## 2016-08-18 ENCOUNTER — Other Ambulatory Visit
Admission: RE | Admit: 2016-08-18 | Discharge: 2016-08-18 | Disposition: A | Discharge status: Home / Self Care | Payer: Medicare PPO | Source: Ambulatory Visit | Attending: Internal Medicine | Admitting: Internal Medicine

## 2016-08-18 DIAGNOSIS — E876 Hypokalemia: Secondary | ICD-10-CM | POA: Insufficient documentation

## 2016-08-18 DIAGNOSIS — Z79899 Other long term (current) drug therapy: Secondary | ICD-10-CM | POA: Diagnosis present

## 2016-08-18 DIAGNOSIS — S72143A Displaced intertrochanteric fracture of unspecified femur, initial encounter for closed fracture: Secondary | ICD-10-CM | POA: Insufficient documentation

## 2016-08-18 DIAGNOSIS — S72142S Displaced intertrochanteric fracture of left femur, sequela: Secondary | ICD-10-CM | POA: Insufficient documentation

## 2016-08-18 DIAGNOSIS — Z8781 Personal history of (healed) traumatic fracture: Secondary | ICD-10-CM | POA: Insufficient documentation

## 2016-08-18 DIAGNOSIS — I951 Orthostatic hypotension: Secondary | ICD-10-CM

## 2016-08-18 LAB — BASIC METABOLIC PANEL
ANION GAP: 7 (ref 5–15)
BUN: 8 mg/dL (ref 6–20)
CALCIUM: 9.7 mg/dL (ref 8.9–10.3)
CHLORIDE: 107 mmol/L (ref 101–111)
CO2: 26 mmol/L (ref 22–32)
Creatinine, Ser: 0.68 mg/dL (ref 0.44–1.00)
GFR calc non Af Amer: >60 mL/min (ref >60)
Glucose, Bld: 90 mg/dL (ref 65–99)
POTASSIUM: 3.5 mmol/L (ref 3.5–5.1)
Sodium: 140 mmol/L (ref 135–145)

## 2016-08-31 ENCOUNTER — Telehealth: Payer: Self-pay | Admitting: Internal Medicine

## 2016-08-31 NOTE — Telephone Encounter (Signed)
Pt had 3/14 appt w/Dr. End. Florinef 0.1mg  QD prescribed.  Attempted to contact pt.  Left message on machine for patient to call the office.

## 2016-08-31 NOTE — Telephone Encounter (Addendum)
Pt reports significant hair loss beginning two weeks ago and thinks it may be d/t one of her medications. Reviewed medication list. Florinef 0.1mg  was prescribed 3/14. No new medications added. No dietary changes. No other symptoms. Recent TSH normal. Advised pt this is not typically a side effect of florinef. Will route to Dr. Saunders Revel for review and to advise.

## 2016-08-31 NOTE — Telephone Encounter (Signed)
Pt calling stating she's has noticed a lot more of her hair coming out Notices it usually when she gets her hair cut, she had it cut last Thursday so it can't be about that she states Its been going on for about two weeks  She thinks it may be one of her medications  Would like some advise on this  Please call back

## 2016-09-01 NOTE — Telephone Encounter (Signed)
Hair loss is not common with fludrocortisone, though there are case reports of this occurring with fludrocortisone and midodrine. Since midodrine was not controlling her orthostatic hypotension all that well, lets have her stop that and see if the hair loss improves. If it continues, we may need to stop fludrocortisone as well.

## 2016-09-01 NOTE — Telephone Encounter (Signed)
Discussed recommendations w/pt who is agreeable w/plan. She will stop midodrine and call back in a week to report if symptoms have improved. Medication list updated.

## 2016-09-12 ENCOUNTER — Telehealth: Payer: Self-pay | Admitting: Internal Medicine

## 2016-09-12 NOTE — Telephone Encounter (Signed)
If she feels fine without significant lightheadedness, I recommend that we hold off restarting midodrine until she returns for follow-up in the office.

## 2016-09-12 NOTE — Telephone Encounter (Signed)
No answer. Left detail message with plan, ok per DPR, and to call back if any questions.

## 2016-09-12 NOTE — Telephone Encounter (Signed)
Returned call to patient. She contacted her PCP or psychiatrist about her Zoloft which can also cause hair loss. Her PCP or psychiatrist started weaning her off her Zoloft last Friday 09/08/16 and should finish the process this Friday and should start on a new medication. She does not have the name of that medication yet as she needs to call her doctor back. She has been off her Midodrine since 09/01/16. She says she's only had a couple of lightheaded spells that have not been that bad. She is asking if Dr End wants her to resume the midodrine or stay off of it. She feels fine to stay off the Midodrine until her next appt with Dr End on 09/20/16 to discuss with him whether or not to go back on the midodrine. Advised to call us back if she has an additional issues or concerns. Will route to Dr End.

## 2016-09-12 NOTE — Telephone Encounter (Signed)
Patient calling to let us know she has notified psych that her hair has been falling out   Patient was told to stop taking midodrine but was told zoloft causes hair loss.  Patient wants to know what now to do about midodrine

## 2016-09-20 ENCOUNTER — Ambulatory Visit (INDEPENDENT_AMBULATORY_CARE_PROVIDER_SITE_OTHER): Payer: Medicare PPO | Admitting: Internal Medicine

## 2016-09-20 ENCOUNTER — Encounter: Payer: Self-pay | Admitting: Internal Medicine

## 2016-09-20 ENCOUNTER — Other Ambulatory Visit: Payer: Self-pay | Admitting: Family Medicine

## 2016-09-20 VITALS — BP 120/70 | HR 97 | Ht 61.0 in | Wt 134.2 lb

## 2016-09-20 DIAGNOSIS — Z13820 Encounter for screening for osteoporosis: Secondary | ICD-10-CM

## 2016-09-20 DIAGNOSIS — R0789 Other chest pain: Secondary | ICD-10-CM

## 2016-09-20 DIAGNOSIS — I951 Orthostatic hypotension: Secondary | ICD-10-CM | POA: Diagnosis not present

## 2016-09-20 DIAGNOSIS — R079 Chest pain, unspecified: Secondary | ICD-10-CM | POA: Insufficient documentation

## 2016-09-20 NOTE — Progress Notes (Signed)
Follow-up Outpatient Visit Date: 09/20/2016  Primary Care Provider: [name], [place] [address]  Chief Complaint: Follow-up orthostatic hypotension and lightheadedness  HPI:  Ms. Iwata is a 61 y.o. year-old female with history of hyperlipidemia and irregular heartbeat, who presents for follow-up of orthostatic hypotension and lightheadedness. I last saw her on 08/09/16. We agreed to decrease Midrin and add fludrocortisone for management of her persistent orthostatic hypotension and lightheadedness. She did not notice any significant improvement but experienced progressive hair loss. We agreed to discontinue Midrin. The patient also switch from sertraline to venlafaxine under the direction of her PCP, as sertraline can also cause alopecia.  Today, she continues to have intermittent lightheadedness. She has not fallen or passed out. She had one episode of left lower chest pain extending to the left upper abdomen last night while lying in bed. This lasted for a few minutes and then resolved spontaneously. She has not had any exertional chest pain. She also denies shortness of breath. She has occasional brief palpitations that accompany anxiety episodes. She is staying well-hydrated, drinking water and Gatorade. She has also tried to liberalize her salt intake. She is not wearing compression stockings at this time, as she did not notice any improvement with them.  -------------------------------------------------------------------------------------------------- Cardiovascular History & Procedures: Cardiovascular Problems:  Orthostatic lightheadedness/fall   Risk Factors:  Hyperlipidemia  Cath/PCI:  None  CV Surgery:  None  EP Procedures and Devices:  Cardiac event monitor (06/28/16): Predominantly sinus rhythm with rare PACs and PVCs, as well as paroxysmal SVT (lasting up to 10.8 seconds).  Non-Invasive Evaluation(s):  Bilateral  carotid Doppler (05/31/16): Mild bilateral carotid atherosclerosis (<50%).  Antegrade vertebral artery flow bilaterally.  TTE (05/29/16): Technically difficult study.  Normal LV size and function (LVEF 60-65%).  No significant valvular abnormalities (though suboptimally visualized).  Normal RV size and function.  Recent CV Pertinent Labs: Lab Results  Component Value Date   CHOL 267 (H) 02/01/2010   HDL 40.20 02/01/2010   LDLCALC 167 06/29/2009   LDLCALC 167 06/29/2009   LDLDIRECT 197.3 02/01/2010   TRIG 184.0 (H) 02/01/2010   CHOLHDL 7 02/01/2010   K 3.5 08/18/2016   K 3.8 12/04/2012   MG 2.1 08/09/2016   BUN 8 08/18/2016   BUN 5 (L) 08/09/2016   BUN 16 12/04/2012   CREATININE 0.68 08/18/2016   CREATININE 0.64 12/04/2012    Past medical and surgical history were reviewed and updated in EPIC.  Outpatient Encounter Prescriptions as of 09/20/2016  Medication Sig  . aspirin EC 81 MG tablet Take 81 mg by mouth daily.  Marland Kitchen atorvastatin (LIPITOR) 40 MG tablet Take by mouth.  . Cholecalciferol (VITAMIN D3) 5000 units TABS Take 5,000 Units by mouth daily.  . clonazePAM (KLONOPIN) 0.5 MG tablet Take 1/2 tablet 3 times a day  . fludrocortisone (FLORINEF) 0.1 MG tablet Take 1 tablet (0.1 mg total) by mouth daily.  . Garlic 8416 MG CAPS Take 1 capsule by mouth daily.  . Multiple Vitamin (MULTIVITAMIN) tablet Take 1 tablet by mouth daily.  . pantoprazole (PROTONIX) 40 MG tablet Take 40 mg by mouth 2 (two) times daily.   . potassium chloride SA (K-DUR,KLOR-CON) 20 MEQ tablet Take 2 tablets (40 mEq total) by mouth daily.  . QUEtiapine (SEROQUEL) 25 MG tablet Take 1-2 tablets by mouth at bedtime.  . valACYclovir (VALTREX) 1000 MG tablet Take 1 tablet (1,000 mg total) by mouth daily.  Marland Kitchen venlafaxine (EFFEXOR) 75 MG tablet Take  75 mg by mouth daily.  . [DISCONTINUED] sertraline (ZOLOFT) 50 MG tablet take 1/2 tablet by mouth once daily for 7 days then 1 tablet once daily THERAFTER   No  facility-administered encounter medications on file as of 09/20/2016.     Allergies: Levofloxacin; Mobic [meloxicam]; Propoxyphene hcl; Tramadol; and Codeine  Social History   Social History  . Marital status: Single    Spouse name: N/A  . Number of children: N/A  . Years of education: N/A   Occupational History  . Not on file.   Social History Main Topics  . Smoking status: Former Smoker    Packs/day: 0.25    Years: 14.00    Types: Cigarettes    Quit date: 50  . Smokeless tobacco: Former Systems developer    Quit date: 08/29/1990  . Alcohol use Yes     Comment: occasional; once every 6 months  . Drug use: No  . Sexual activity: Yes    Birth control/ protection: Surgical   Other Topics Concern  . Not on file   Social History Narrative  . No narrative on file    Family History  Problem Relation Age of Onset  . Diabetes Mother   . COPD Mother   . Heart disease Mother   . Diabetes Father   . COPD Father   . Heart disease Father   . Heart attack Father 30    CABG  . Alcohol abuse Daughter   . Depression Daughter   . Tuberculosis Maternal Aunt   . Cancer Paternal Aunt     breast  . Diabetes Paternal Grandmother   . Diabetes Sister   . Cancer Cousin   . Diabetes Cousin   . Colon cancer Neg Hx   . Sudden Cardiac Death Neg Hx     Review of Systems: She continues to have left leg pain, predominantly about the knee. He is due for repeat evaluation with her orthopedist. Otherwise, a 12-system review of systems was performed and was negative except as noted in the HPI.  --------------------------------------------------------------------------------------------------  Physical Exam: BP 120/70 (BP Location: Left Arm, Patient Position: Sitting, Cuff Size: Normal)   Pulse 97   Ht 5\' 1"  (1.549 m)   Wt 134 lb 4 oz (60.9 kg)   BMI 25.37 kg/m  Position Blood pressure (mmHg) Heart rate (bpm)  Lying 122/80 105  Sitting 118/74 101  Standing 102/74 112  Standing (3 minutes)  100/74 121   General:  Well-developed, well-nourished woman, seated comfortably in the exam room. HEENT: No conjunctival pallor or scleral icterus.  Moist mucous membranes.  OP clear. Neck: Supple without lymphadenopathy, thyromegaly, JVD, or HJR. Lungs: Normal work of breathing.  Clear to auscultation bilaterally without wheezes or crackles. Heart: Regular rate and rhythm without murmurs, rubs, or gallops.  Non-displaced PMI. Abd: Bowel sounds present.  Soft, NT/ND without hepatosplenomegaly Ext: No lower extremity edema.  Radial, PT, and DP pulses are 2+ bilaterally.  EKG:  Normal sinus rhythm with non-specific ST changes. No significant change from prior tracing on 08/09/16 (I have personally reviewed both tracings).  Lab Results  Component Value Date   WBC 8.2 06/02/2016   HGB 10.9 (L) 06/02/2016   HCT 30.3 (L) 06/02/2016   MCV 98.9 06/02/2016   PLT 196 06/02/2016    Lab Results  Component Value Date   NA 140 08/18/2016   K 3.5 08/18/2016   CL 107 08/18/2016   CO2 26 08/18/2016   BUN 8 08/18/2016   CREATININE 0.68  08/18/2016   GLUCOSE 90 08/18/2016   ALT 29 08/09/2016    Lab Results  Component Value Date   CHOL 267 (H) 02/01/2010   HDL 40.20 02/01/2010   LDLCALC 167 06/29/2009   LDLCALC 167 06/29/2009   LDLDIRECT 197.3 02/01/2010   TRIG 184.0 (H) 02/01/2010   CHOLHDL 7 02/01/2010   --------------------------------------------------------------------------------------------------  ASSESSMENT AND PLAN: Orthostatic hypotension Patient continues to have orthostatic hypotension with modest rise in heart rate today. Even her resting heart rate is mildly elevated. She is off midodrine at this time but continues on fludrocortisone 0.1 mg daily. Given alopecia since adding this medication, I am hesitant to increase it further. For her loss does not improve soon, we may need to consider stopping fludrocortisone. I have encouraged her to continue to stay well-hydrated and to  liberalize her salt intake further. She could even consider using salt tablets. We spoke again about the potential for her psychotropic medications (clonazepam, quetiapine, and venlafaxine) contributing to her orthostatic hypotension. She is hesitant to make any changes due to her history of depression and PTSD. I encouraged her to speak with her PCP about this. If her symptoms persist, I will refer Ms. Burgard to Dr. Caryl Comes for further evaluation. We will check a basic metabolic panel when the patient returns for her stress test.  Atypical chest pain Ms. Kaster reports one episode of left-sided chest pain while at rest yesterday. She does not have any exertional symptoms, though her mobility is limited by her left leg injury. Given atypical chest pain, persistent orthostatic hypotension, and nonspecific ST changes on EKG, we have agreed to perform an ischemia evaluation with an pharmacologic myocardial perfusion stress test. We will continue with aspirin and atorvastatin for primary prevention. We will check a fasting lipid panel when the patient presents for her stress test for risk stratification.  Follow-up: Return to clinic in 1 month.  Nelva Bush, MD 09/20/2016 11:15 PM

## 2016-09-20 NOTE — Patient Instructions (Addendum)
Medication Instructions:  Your physician recommends that you continue on your current medications as directed. Please refer to the Current Medication list given to you today.   Labwork: none  Testing/Procedures: Your physician has requested that you have a lexiscan myoview. For further information please visit [email or web address]. Please follow instruction sheet, as given.  [place]  Your caregiver has ordered a Stress Test with nuclear imaging. The purpose of this test is to evaluate the blood supply to your heart muscle. This procedure is referred to as a "Non-Invasive Stress Test." This is because other than having an IV started in your vein, nothing is inserted or "invades" your body. Cardiac stress tests are done to find areas of poor blood flow to the heart by determining the extent of coronary artery disease (CAD). Some patients exercise on a treadmill, which naturally increases the blood flow to your heart, while others who are  unable to walk on a treadmill due to physical limitations have a pharmacologic/chemical stress agent called Lexiscan . This medicine will mimic walking on a treadmill by temporarily increasing your coronary blood flow.   Please note: these test may take anywhere between 2-4 hours to complete  PLEASE REPORT TO [facility] [employer] [facility] AT THE FIRST DESK WILL DIRECT YOU WHERE TO GO  Date of Procedure:_________5/4/18 FRIDAY______________  Arrival Time for Procedure:_______07:45 AM_________  Instructions regarding medication:   You may take your medications as usual with a small sip of water.   PLEASE NOTIFY THE OFFICE AT LEAST 62 HOURS IN ADVANCE IF YOU ARE UNABLE TO KEEP YOUR APPOINTMENT.  [phone] AND  PLEASE NOTIFY NUCLEAR MEDICINE AT [facility] AT LEAST 24 HOURS IN ADVANCE IF YOU ARE UNABLE TO KEEP YOUR APPOINTMENT. [phone]  How to prepare for your Myoview test:  1. Do not eat or drink after midnight 2. No caffeine for 24  hours prior to test 3. No smoking 24 hours prior to test. 4. Your medication may be taken with water.  If your doctor stopped a medication because of this test, do not take that medication. 5. Ladies, please do not wear dresses.  Skirts or pants are appropriate. Please wear a short sleeve shirt. 6. No perfume, cologne or lotion. 7. Wear comfortable walking shoes. No heels!    Follow-Up: Your physician recommends that you schedule a follow-up appointment in: [place].  Dr End recommends that you increase your intake of fluids and salt.   If you need a refill on your cardiac medications before your next appointment, please call your pharmacy.  Cardiac Nuclear Scan A cardiac nuclear scan is a test that measures blood flow to the heart when a person is resting and when he or she is exercising. The test looks for problems such as:  Not enough blood reaching a portion of the heart.  The heart muscle not working normally. You may need this test if:  You have heart disease.  You have had abnormal lab results.  You have had heart surgery or angioplasty.  You have chest pain.  You have shortness of breath. In this test, a radioactive dye (tracer) is injected into your bloodstream. After the tracer has traveled to your heart, an imaging device is used to measure how much of the tracer is absorbed by or distributed to various areas of your heart. This procedure is usually done at a hospital and takes 2-4 hours. Tell a health care provider about:  Any allergies you have.  All  medicines you are taking, including vitamins, herbs, eye drops, creams, and over-the-counter medicines.  Any problems you or family members have had with the use of anesthetic medicines.  Any blood disorders you have.  Any surgeries you have had.  Any medical conditions you have.  Whether you are pregnant or may be pregnant. What are the risks? Generally, this is a safe procedure. However,  problems may occur, including:  Serious chest pain and heart attack. This is only a risk if the stress portion of the test is done.  Rapid heartbeat.  Sensation of warmth in your chest. This usually passes quickly. What happens before the procedure?  Ask your health care provider about changing or stopping your regular medicines. This is especially important if you are taking diabetes medicines or blood thinners.  Remove your jewelry on the day of the procedure. What happens during the procedure?  An IV tube will be inserted into one of your veins.  Your health care provider will inject a small amount of radioactive tracer through the tube.  You will wait for 20-40 minutes while the tracer travels through your bloodstream.  Your heart activity will be monitored with an electrocardiogram (ECG).  You will lie down on an exam table.  Images of your heart will be taken for about 15-20 minutes.  You may be asked to exercise on a treadmill or stationary bike. While you exercise, your heart's activity will be monitored with an ECG, and your blood pressure will be checked. If you are unable to exercise, you may be given a medicine to increase blood flow to parts of your heart.  When blood flow to your heart has peaked, a tracer will again be injected through the IV tube.  After 20-40 minutes, you will get back on the exam table and have more images taken of your heart.  When the procedure is over, your IV tube will be removed. The procedure may vary among health care providers and hospitals. Depending on the type of tracer used, scans may need to be repeated 3-4 hours later. What happens after the procedure?  Unless your health care provider tells you otherwise, you may return to your normal schedule, including diet, activities, and medicines.  Unless your health care provider tells you otherwise, you may increase your fluid intake. This will help flush the contrast dye from your body.  Drink enough fluid to keep your urine clear or pale yellow.  It is up to you to get your test results. Ask your health care provider, or the department that is doing the test, when your results will be ready. Summary  A cardiac nuclear scan measures the blood flow to the heart when a person is resting and when he or she is exercising.  You may need this test if you are at risk for heart disease.  Tell your health care provider if you are pregnant.  Unless your health care provider tells you otherwise, increase your fluid intake. This will help flush the contrast dye from your body. Drink enough fluid to keep your urine clear or pale yellow. This information is not intended to replace advice given to you by your health care provider. Make sure you discuss any questions you have with your health care provider. Document Released: 06/09/2004 Document Revised: 05/17/2016 Document Reviewed: 04/23/2013 Elsevier Interactive Patient Education  2017 [employer].

## 2016-09-21 ENCOUNTER — Telehealth: Payer: Self-pay | Admitting: *Deleted

## 2016-09-21 DIAGNOSIS — Z79899 Other long term (current) drug therapy: Secondary | ICD-10-CM

## 2016-09-21 DIAGNOSIS — E785 Hyperlipidemia, unspecified: Secondary | ICD-10-CM

## 2016-09-21 NOTE — Telephone Encounter (Signed)
-----   Message from Nelva Bush, MD sent at 09/20/2016 11:19 PM EDT ----- Regarding: Labs Claris Gladden,  Sorry to bother you. Could you have Ms. Arps get a BMP and fasting lipid panel drawn when she comes in for her stress test? Thanks.  Gerald Stabs

## 2016-09-21 NOTE — Telephone Encounter (Signed)
Called patient and she verbalized understanding to come around 0700 to [place] on 09/29/16, the morning of her myoview. She understands to be fasting. Orders for labs entered into EPIC.

## 2016-09-25 ENCOUNTER — Ambulatory Visit
Admission: RE | Admit: 2016-09-25 | Discharge: 2016-09-25 | Disposition: A | Discharge status: Home / Self Care | Payer: Medicare PPO | Source: Ambulatory Visit | Attending: Family Medicine | Admitting: Family Medicine

## 2016-09-25 DIAGNOSIS — M810 Age-related osteoporosis without current pathological fracture: Secondary | ICD-10-CM | POA: Diagnosis not present

## 2016-09-25 DIAGNOSIS — Z13820 Encounter for screening for osteoporosis: Secondary | ICD-10-CM

## 2016-09-25 DIAGNOSIS — M85831 Other specified disorders of bone density and structure, right forearm: Secondary | ICD-10-CM | POA: Insufficient documentation

## 2016-09-29 ENCOUNTER — Encounter
Admission: RE | Admit: 2016-09-29 | Discharge: 2016-09-29 | Disposition: A | Discharge status: Home / Self Care | Payer: Medicare HMO | Source: Ambulatory Visit | Attending: Internal Medicine | Admitting: Internal Medicine

## 2016-09-29 ENCOUNTER — Other Ambulatory Visit
Admission: RE | Admit: 2016-09-29 | Discharge: 2016-09-29 | Disposition: A | Discharge status: Home / Self Care | Payer: Medicare HMO | Source: Ambulatory Visit | Attending: Internal Medicine | Admitting: Internal Medicine

## 2016-09-29 DIAGNOSIS — R0789 Other chest pain: Secondary | ICD-10-CM | POA: Diagnosis not present

## 2016-09-29 DIAGNOSIS — I951 Orthostatic hypotension: Secondary | ICD-10-CM | POA: Diagnosis not present

## 2016-09-29 LAB — BASIC METABOLIC PANEL
ANION GAP: 6 (ref 5–15)
BUN: 9 mg/dL (ref 6–20)
CALCIUM: 9.3 mg/dL (ref 8.9–10.3)
CO2: 28 mmol/L (ref 22–32)
CREATININE: 0.57 mg/dL (ref 0.44–1.00)
Chloride: 105 mmol/L (ref 101–111)
GLUCOSE: 93 mg/dL (ref 65–99)
Potassium: 3.3 mmol/L — ABNORMAL LOW (ref 3.5–5.1)
Sodium: 139 mmol/L (ref 135–145)

## 2016-09-29 LAB — NM MYOCAR MULTI W/SPECT W/WALL MOTION / EF
CHL CUP NUCLEAR SSS: 0
CHL CUP RESTING HR STRESS: 86 {beats}/min
CSEPED: 0 min
CSEPEDS: 0 s
CSEPEW: 1 METS
CSEPHR: 83 %
CSEPPHR: 134 {beats}/min
LV dias vol: 47 mL (ref 46–106)
LV sys vol: 15 mL
MPHR: 160 {beats}/min
SDS: 0
SRS: 1
TID: 1.04

## 2016-09-29 LAB — LIPID PANEL
Cholesterol: 206 mg/dL — ABNORMAL HIGH (ref 0–200)
HDL: 41 mg/dL (ref >40)
LDL CALC: 106 mg/dL — AB (ref 0–99)
Total CHOL/HDL Ratio: 5 RATIO
Triglycerides: 296 mg/dL — ABNORMAL HIGH (ref <150)
VLDL: 59 mg/dL — AB (ref 0–40)

## 2016-09-29 MED ORDER — TECHNETIUM TC 99M TETROFOSMIN IV KIT
30.0000 | PACK | Freq: Once | INTRAVENOUS | Status: AC | PRN
  Administered 2016-09-29: 32.19 via INTRAVENOUS

## 2016-09-29 MED ORDER — TECHNETIUM TC 99M TETROFOSMIN IV KIT
13.1670 | PACK | Freq: Once | INTRAVENOUS | Status: AC | PRN
  Administered 2016-09-29: 13.167 via INTRAVENOUS

## 2016-09-29 MED ORDER — REGADENOSON 0.4 MG/5ML IV SOLN
0.4000 mg | Freq: Once | INTRAVENOUS | Status: AC
  Administered 2016-09-29: 0.4 mg via INTRAVENOUS

## 2016-10-05 DIAGNOSIS — F331 Major depressive disorder, recurrent, moderate: Secondary | ICD-10-CM | POA: Diagnosis not present

## 2016-10-05 DIAGNOSIS — R69 Illness, unspecified: Secondary | ICD-10-CM | POA: Diagnosis not present

## 2016-10-18 ENCOUNTER — Encounter: Payer: Self-pay | Admitting: Internal Medicine

## 2016-10-18 ENCOUNTER — Ambulatory Visit (INDEPENDENT_AMBULATORY_CARE_PROVIDER_SITE_OTHER): Payer: Medicare HMO | Admitting: Internal Medicine

## 2016-10-18 VITALS — BP 100/62 | HR 96 | Ht 61.0 in | Wt 130.2 lb

## 2016-10-18 DIAGNOSIS — E876 Hypokalemia: Secondary | ICD-10-CM

## 2016-10-18 DIAGNOSIS — R6889 Other general symptoms and signs: Secondary | ICD-10-CM

## 2016-10-18 DIAGNOSIS — R0789 Other chest pain: Secondary | ICD-10-CM

## 2016-10-18 DIAGNOSIS — I951 Orthostatic hypotension: Secondary | ICD-10-CM

## 2016-10-18 DIAGNOSIS — Z79899 Other long term (current) drug therapy: Secondary | ICD-10-CM | POA: Diagnosis not present

## 2016-10-18 DIAGNOSIS — Z1329 Encounter for screening for other suspected endocrine disorder: Secondary | ICD-10-CM

## 2016-10-18 DIAGNOSIS — L659 Nonscarring hair loss, unspecified: Secondary | ICD-10-CM

## 2016-10-18 NOTE — Patient Instructions (Signed)
Medication Instructions:  Your physician has recommended you make the following change in your medication:  1- STOP TAKING Florinef (Fludrocortisone).    Labwork: Your physician recommends that you return for lab work in: TODAY (BMP, TSH, Free T4).   Testing/Procedures: NONE  Follow-Up: Your physician recommends that you schedule a follow-up appointment in: 3 MONTHS WITH DR END.  If you need a refill on your cardiac medications before your next appointment, please call your pharmacy.

## 2016-10-18 NOTE — Progress Notes (Signed)
Follow-up Outpatient Visit Date: 10/18/2016  Primary Care Provider: [facility], [place] [address]  Chief Complaint: Follow-up orthostatic hypotension and lightheadedness  HPI:  [name] is a 61 y.o. year-old female with history of hyperlipidemia and irregular heartbeat, who presents for follow-up of orthostatic hypotension and lightheadedness. Last saw her on 09/20/16. Since that time, she has felt about the same, with occasional brief episodes of orthostatic lightheadedness. She has not had any falls area she remains on fludrocortisone 0.1 mg daily but feels that it has not made much difference. She continues to have hair loss. She is in the process of titrating some of her depression medications and is currently still on Effexor, Seroquel, and Klonopin.  She notes occasional brief chest discomfort, which can happen at any time. It is not exertional and is described as a soreness underneath the breast bone. Typically last a few minutes before resolving spontaneously. She denies shortness of breath, orthopnea, PND, edema. [name] continues to struggle with left knee pain, which is present both with activity and when sitting or lying still. She has completed physical therapy. She is due to follow-up with her orthopedist next month. She remains compliant with her medications including aggressive potassium supplementation due to persistent hypokalemia.  -------------------------------------------------------------------------------------------------- Cardiovascular History & Procedures: Cardiovascular Problems:  Orthostatic lightheadedness/fall   Risk Factors:  Hyperlipidemia  Cath/PCI:  None  CV Surgery:  None  EP Procedures and Devices:  Cardiac event monitor (06/28/16): Predominantly sinus rhythm with rare PACs and PVCs, as well as paroxysmal SVT (lasting up to 10.8 seconds).  Non-Invasive Evaluation(s):  Pharmacologic MPI  (09/29/16): Low risk study without ischemia or scar. Nonspecific ST depression noted with regular dentist sent. LVEF 55-65%.  Bilateral carotid Doppler (05/31/16): Mild bilateral carotid atherosclerosis (<50%).  Antegrade vertebral artery flow bilaterally.  TTE (05/29/16): Technically difficult study.  Normal LV size and function (LVEF 60-65%).  No significant valvular abnormalities (though suboptimally visualized).  Normal RV size and function.  Recent CV Pertinent Labs: Lab Results  Component Value Date   CHOL 206 (H) 09/29/2016   HDL 41 09/29/2016   LDLCALC 106 (H) 09/29/2016   LDLDIRECT 197.3 02/01/2010   TRIG 296 (H) 09/29/2016   CHOLHDL 5.0 09/29/2016   K 3.3 (L) 09/29/2016   K 3.8 12/04/2012   MG 2.1 08/09/2016   BUN 9 09/29/2016   BUN 5 (L) 08/09/2016   BUN 16 12/04/2012   CREATININE 0.57 09/29/2016   CREATININE 0.64 12/04/2012    Past medical and surgical history were reviewed and updated in EPIC.  Outpatient Encounter Prescriptions as of 10/18/2016  Medication Sig  . aspirin EC 81 MG tablet Take 81 mg by mouth daily.  Marland Kitchen atorvastatin (LIPITOR) 40 MG tablet Take by mouth.  . Cholecalciferol (VITAMIN D3) 5000 units TABS Take 5,000 Units by mouth daily.  . clonazePAM (KLONOPIN) 0.5 MG tablet Take 1/2 tablet 3 times a day  . Garlic 5462 MG CAPS Take 1 capsule by mouth daily.  . Multiple Vitamin (MULTIVITAMIN) tablet Take 1 tablet by mouth daily.  . pantoprazole (PROTONIX) 40 MG tablet Take 40 mg by mouth 2 (two) times daily.   . potassium chloride SA (K-DUR,KLOR-CON) 20 MEQ tablet Take 2 tablets (40 mEq total) by mouth daily. (Patient taking differently: Take 40 mEq by mouth 2 (two) times daily. )  . QUEtiapine (SEROQUEL) 25 MG tablet Take 1-2 tablets by mouth at bedtime.  . valACYclovir (VALTREX) 1000 MG tablet Take 1  tablet (1,000 mg total) by mouth daily.  Marland Kitchen venlafaxine (EFFEXOR) 75 MG tablet Take 75 mg by mouth daily.  . [DISCONTINUED] fludrocortisone (FLORINEF) 0.1 MG  tablet Take 1 tablet (0.1 mg total) by mouth daily.   No facility-administered encounter medications on file as of 10/18/2016.     Allergies: Levofloxacin; Mobic [meloxicam]; Propoxyphene hcl; Tramadol; and Codeine  Social History   Social History  . Marital status: Single    Spouse name: N/A  . Number of children: N/A  . Years of education: N/A   Occupational History  . Not on file.   Social History Main Topics  . Smoking status: Former Smoker    Packs/day: 0.25    Years: 14.00    Types: Cigarettes    Quit date: 39  . Smokeless tobacco: Former Systems developer    Quit date: 08/29/1990  . Alcohol use Yes     Comment: occasional; once every 6 months  . Drug use: No  . Sexual activity: Yes    Birth control/ protection: Surgical   Other Topics Concern  . Not on file   Social History Narrative  . No narrative on file    Family History  Problem Relation Age of Onset  . Diabetes Mother   . COPD Mother   . Heart disease Mother   . Diabetes Father   . COPD Father   . Heart disease Father   . Heart attack Father 49       CABG  . Alcohol abuse Daughter   . Depression Daughter   . Tuberculosis Maternal Aunt   . Cancer Paternal Aunt        breast  . Diabetes Paternal Grandmother   . Diabetes Sister   . Cancer Cousin   . Diabetes Cousin   . Colon cancer Neg Hx   . Sudden Cardiac Death Neg Hx     Review of Systems: Miss Wanda [name] reports long-standing cold intolerance. Otherwise, he 12-system review of systems was performed and was negative except as noted in the HPI.  --------------------------------------------------------------------------------------------------  Physical Exam: BP 100/62 (BP Location: Right Arm, Patient Position: Sitting, Cuff Size: Normal)   Pulse 96   Ht 5\' 1"  (1.549 m)   Wt 130 lb 4 oz (59.1 kg)   LMP 09/30/1991 (Approximate) Comment: 1993  BMI 24.61 kg/m   General:  Well-developed, well-nourished woman, seated comfortably in the exam  room. HEENT: No conjunctival pallor or scleral icterus.  Moist mucous membranes.  OP clear. Thinning of the hair, predominantly frontotemporal distribution, is. Neck: Supple without lymphadenopathy, thyromegaly, JVD, or HJR. Lungs: Normal work of breathing.  Clear to auscultation bilaterally without wheezes or crackles. Heart: Regular rate and rhythm without murmurs, rubs, or gallops.  Non-displaced PMI. Abd: Bowel sounds present.  Soft, NT/ND without hepatosplenomegaly Ext: No lower extremity edema.  Radial, PT, and DP pulses are 2+ bilaterally. Skin: Warm and dry without rash.  Lab Results  Component Value Date   WBC 8.2 06/02/2016   HGB 10.9 (L) 06/02/2016   HCT 30.3 (L) 06/02/2016   MCV 98.9 06/02/2016   PLT 196 06/02/2016    Lab Results  Component Value Date   NA 139 09/29/2016   K 3.3 (L) 09/29/2016   CL 105 09/29/2016   CO2 28 09/29/2016   BUN 9 09/29/2016   CREATININE 0.57 09/29/2016   GLUCOSE 93 09/29/2016   ALT 29 08/09/2016    Lab Results  Component Value Date   CHOL 206 (H) 09/29/2016   HDL  41 09/29/2016   LDLCALC 106 (H) 09/29/2016   LDLDIRECT 197.3 02/01/2010   TRIG 296 (H) 09/29/2016   CHOLHDL 5.0 09/29/2016   --------------------------------------------------------------------------------------------------  ASSESSMENT AND PLAN: Orthostatic hypotension, hypokalemia, hair loss, and cold intolerance Symptoms are unchanged despite addition of fludrocortisone. Due to continued alopecia and hypokalemia, we have agreed to discontinue fludrocortisone. I will repeat her labs today to reassess her potassium. I am concerned about an underlying endocrine or renal cause for her constellation of symptoms, including orthostatic hypotension, persistent hypokalemia, hair loss, and cold intolerance. Review of prior labs is notable for an elevated TSH in January, which was normal in March. I will repeat a TSH today as well as check a free T4. We will also repeat a basic  metabolic panel to reassess her potassium. Base of these results, we will consider referral to nephrology and/or endocrinology. I encouraged [name] to continue to stay well-hydrated.  Atypical chest pain Rare atypical chest pain episodes continue. Myocardial perfusion stress test after our last visit was normal. No further ischemia workup at this time.  Follow-up: Return to clinic in 3 month.  Nelva Bush, MD 10/19/2016 7:36 AM

## 2016-10-19 LAB — BASIC METABOLIC PANEL
BUN/Creatinine Ratio: 10 — ABNORMAL LOW (ref 12–28)
BUN: 6 mg/dL — ABNORMAL LOW (ref 8–27)
CALCIUM: 9.5 mg/dL (ref 8.7–10.3)
CHLORIDE: 105 mmol/L (ref 96–106)
CO2: 27 mmol/L (ref 18–29)
Creatinine, Ser: 0.59 mg/dL (ref 0.57–1.00)
GFR calc non Af Amer: 100 mL/min/{1.73_m2} (ref >59)
GFR, EST AFRICAN AMERICAN: 115 mL/min/{1.73_m2} (ref >59)
Glucose: 87 mg/dL (ref 65–99)
POTASSIUM: 3.6 mmol/L (ref 3.5–5.2)
SODIUM: 145 mmol/L — AB (ref 134–144)

## 2016-10-19 LAB — TSH: TSH: 2.05 u[IU]/mL (ref 0.450–4.500)

## 2016-10-19 LAB — T4, FREE: Free T4: 1.39 ng/dL (ref 0.82–1.77)

## 2016-10-20 ENCOUNTER — Telehealth: Payer: Self-pay | Admitting: Internal Medicine

## 2016-10-20 ENCOUNTER — Other Ambulatory Visit: Payer: Self-pay

## 2016-10-20 DIAGNOSIS — I951 Orthostatic hypotension: Secondary | ICD-10-CM

## 2016-10-20 DIAGNOSIS — E876 Hypokalemia: Secondary | ICD-10-CM

## 2016-10-20 NOTE — Telephone Encounter (Signed)
Reviewed labs w/pt. Referral to [facility] call back. See lab results

## 2016-10-20 NOTE — Telephone Encounter (Signed)
Pt wants to know about her results on labs she did on Wednesday   Please call back

## 2016-10-26 ENCOUNTER — Ambulatory Visit: Payer: Medicare PPO | Admitting: Internal Medicine

## 2016-11-06 DIAGNOSIS — S72142D Displaced intertrochanteric fracture of left femur, subsequent encounter for closed fracture with routine healing: Secondary | ICD-10-CM | POA: Diagnosis not present

## 2016-11-16 DIAGNOSIS — E876 Hypokalemia: Secondary | ICD-10-CM | POA: Diagnosis not present

## 2016-11-16 DIAGNOSIS — I951 Orthostatic hypotension: Secondary | ICD-10-CM | POA: Diagnosis not present

## 2016-11-20 DIAGNOSIS — E876 Hypokalemia: Secondary | ICD-10-CM | POA: Diagnosis not present

## 2016-11-27 DIAGNOSIS — L74513 Primary focal hyperhidrosis, soles: Secondary | ICD-10-CM | POA: Diagnosis not present

## 2016-11-27 DIAGNOSIS — D692 Other nonthrombocytopenic purpura: Secondary | ICD-10-CM | POA: Diagnosis not present

## 2016-11-27 DIAGNOSIS — L812 Freckles: Secondary | ICD-10-CM | POA: Diagnosis not present

## 2016-11-27 DIAGNOSIS — Z1283 Encounter for screening for malignant neoplasm of skin: Secondary | ICD-10-CM | POA: Diagnosis not present

## 2016-11-27 DIAGNOSIS — Z85828 Personal history of other malignant neoplasm of skin: Secondary | ICD-10-CM | POA: Diagnosis not present

## 2016-11-27 DIAGNOSIS — D1800 Hemangioma unspecified site: Secondary | ICD-10-CM | POA: Diagnosis not present

## 2016-11-27 DIAGNOSIS — D229 Melanocytic nevi, unspecified: Secondary | ICD-10-CM | POA: Diagnosis not present

## 2016-11-27 DIAGNOSIS — L821 Other seborrheic keratosis: Secondary | ICD-10-CM | POA: Diagnosis not present

## 2016-11-27 DIAGNOSIS — L74512 Primary focal hyperhidrosis, palms: Secondary | ICD-10-CM | POA: Diagnosis not present

## 2016-11-27 DIAGNOSIS — L578 Other skin changes due to chronic exposure to nonionizing radiation: Secondary | ICD-10-CM | POA: Diagnosis not present

## 2016-11-30 DIAGNOSIS — I959 Hypotension, unspecified: Secondary | ICD-10-CM | POA: Diagnosis not present

## 2016-11-30 DIAGNOSIS — R946 Abnormal results of thyroid function studies: Secondary | ICD-10-CM | POA: Diagnosis not present

## 2016-11-30 DIAGNOSIS — K5900 Constipation, unspecified: Secondary | ICD-10-CM | POA: Diagnosis not present

## 2016-12-18 DIAGNOSIS — S72142D Displaced intertrochanteric fracture of left femur, subsequent encounter for closed fracture with routine healing: Secondary | ICD-10-CM | POA: Diagnosis not present

## 2017-01-01 ENCOUNTER — Other Ambulatory Visit: Payer: Self-pay | Admitting: Family Medicine

## 2017-01-01 DIAGNOSIS — Z1231 Encounter for screening mammogram for malignant neoplasm of breast: Secondary | ICD-10-CM

## 2017-01-03 DIAGNOSIS — F331 Major depressive disorder, recurrent, moderate: Secondary | ICD-10-CM | POA: Diagnosis not present

## 2017-01-03 DIAGNOSIS — R69 Illness, unspecified: Secondary | ICD-10-CM | POA: Diagnosis not present

## 2017-01-04 ENCOUNTER — Ambulatory Visit: Payer: Medicare HMO | Attending: Nurse Practitioner | Admitting: Nurse Practitioner

## 2017-01-04 ENCOUNTER — Ambulatory Visit
Admission: RE | Admit: 2017-01-04 | Discharge: 2017-01-04 | Disposition: A | Discharge status: Home / Self Care | Payer: Medicare HMO | Source: Ambulatory Visit | Attending: Nurse Practitioner | Admitting: Nurse Practitioner

## 2017-01-04 ENCOUNTER — Encounter: Payer: Self-pay | Admitting: Nurse Practitioner

## 2017-01-04 ENCOUNTER — Ambulatory Visit
Admission: RE | Admit: 2017-01-04 | Discharge: 2017-01-04 | Disposition: A | Discharge status: Home / Self Care | Payer: Medicare HMO | Source: Ambulatory Visit | Attending: Pain Medicine | Admitting: Pain Medicine

## 2017-01-04 VITALS — BP 121/80 | HR 83 | Temp 97.9°F | Resp 16 | Ht 61.0 in | Wt 134.0 lb

## 2017-01-04 DIAGNOSIS — G8929 Other chronic pain: Secondary | ICD-10-CM

## 2017-01-04 DIAGNOSIS — E785 Hyperlipidemia, unspecified: Secondary | ICD-10-CM | POA: Diagnosis not present

## 2017-01-04 DIAGNOSIS — F329 Major depressive disorder, single episode, unspecified: Secondary | ICD-10-CM | POA: Diagnosis not present

## 2017-01-04 DIAGNOSIS — Z881 Allergy status to other antibiotic agents status: Secondary | ICD-10-CM | POA: Insufficient documentation

## 2017-01-04 DIAGNOSIS — Z7983 Long term (current) use of bisphosphonates: Secondary | ICD-10-CM | POA: Insufficient documentation

## 2017-01-04 DIAGNOSIS — G894 Chronic pain syndrome: Secondary | ICD-10-CM | POA: Insufficient documentation

## 2017-01-04 DIAGNOSIS — Z7982 Long term (current) use of aspirin: Secondary | ICD-10-CM | POA: Diagnosis not present

## 2017-01-04 DIAGNOSIS — G43009 Migraine without aura, not intractable, without status migrainosus: Secondary | ICD-10-CM | POA: Diagnosis not present

## 2017-01-04 DIAGNOSIS — I951 Orthostatic hypotension: Secondary | ICD-10-CM | POA: Insufficient documentation

## 2017-01-04 DIAGNOSIS — Z885 Allergy status to narcotic agent status: Secondary | ICD-10-CM | POA: Diagnosis not present

## 2017-01-04 DIAGNOSIS — I471 Supraventricular tachycardia: Secondary | ICD-10-CM | POA: Diagnosis not present

## 2017-01-04 DIAGNOSIS — M810 Age-related osteoporosis without current pathological fracture: Secondary | ICD-10-CM | POA: Diagnosis not present

## 2017-01-04 DIAGNOSIS — R69 Illness, unspecified: Secondary | ICD-10-CM | POA: Diagnosis not present

## 2017-01-04 DIAGNOSIS — R7303 Prediabetes: Secondary | ICD-10-CM | POA: Insufficient documentation

## 2017-01-04 DIAGNOSIS — Z87891 Personal history of nicotine dependence: Secondary | ICD-10-CM | POA: Insufficient documentation

## 2017-01-04 DIAGNOSIS — Z8781 Personal history of (healed) traumatic fracture: Secondary | ICD-10-CM | POA: Diagnosis not present

## 2017-01-04 DIAGNOSIS — Z79899 Other long term (current) drug therapy: Secondary | ICD-10-CM | POA: Diagnosis not present

## 2017-01-04 DIAGNOSIS — J309 Allergic rhinitis, unspecified: Secondary | ICD-10-CM | POA: Diagnosis not present

## 2017-01-04 DIAGNOSIS — M47892 Other spondylosis, cervical region: Secondary | ICD-10-CM | POA: Diagnosis not present

## 2017-01-04 DIAGNOSIS — F419 Anxiety disorder, unspecified: Secondary | ICD-10-CM | POA: Diagnosis not present

## 2017-01-04 DIAGNOSIS — M542 Cervicalgia: Secondary | ICD-10-CM | POA: Diagnosis not present

## 2017-01-04 DIAGNOSIS — R0789 Other chest pain: Secondary | ICD-10-CM | POA: Diagnosis not present

## 2017-01-04 DIAGNOSIS — Z888 Allergy status to other drugs, medicaments and biological substances status: Secondary | ICD-10-CM | POA: Insufficient documentation

## 2017-01-04 DIAGNOSIS — K5909 Other constipation: Secondary | ICD-10-CM | POA: Insufficient documentation

## 2017-01-04 DIAGNOSIS — M1611 Unilateral primary osteoarthritis, right hip: Secondary | ICD-10-CM | POA: Diagnosis not present

## 2017-01-04 DIAGNOSIS — Z811 Family history of alcohol abuse and dependence: Secondary | ICD-10-CM | POA: Insufficient documentation

## 2017-01-04 DIAGNOSIS — E876 Hypokalemia: Secondary | ICD-10-CM | POA: Insufficient documentation

## 2017-01-04 DIAGNOSIS — M488X6 Other specified spondylopathies, lumbar region: Secondary | ICD-10-CM | POA: Diagnosis not present

## 2017-01-04 DIAGNOSIS — N3946 Mixed incontinence: Secondary | ICD-10-CM | POA: Diagnosis not present

## 2017-01-04 DIAGNOSIS — M47816 Spondylosis without myelopathy or radiculopathy, lumbar region: Secondary | ICD-10-CM | POA: Diagnosis not present

## 2017-01-04 DIAGNOSIS — K219 Gastro-esophageal reflux disease without esophagitis: Secondary | ICD-10-CM | POA: Insufficient documentation

## 2017-01-04 DIAGNOSIS — M4696 Unspecified inflammatory spondylopathy, lumbar region: Secondary | ICD-10-CM | POA: Diagnosis not present

## 2017-01-04 DIAGNOSIS — Z8249 Family history of ischemic heart disease and other diseases of the circulatory system: Secondary | ICD-10-CM | POA: Insufficient documentation

## 2017-01-04 DIAGNOSIS — Z818 Family history of other mental and behavioral disorders: Secondary | ICD-10-CM | POA: Insufficient documentation

## 2017-01-04 DIAGNOSIS — Z825 Family history of asthma and other chronic lower respiratory diseases: Secondary | ICD-10-CM | POA: Insufficient documentation

## 2017-01-04 DIAGNOSIS — M545 Low back pain: Secondary | ICD-10-CM | POA: Diagnosis present

## 2017-01-04 DIAGNOSIS — Z87442 Personal history of urinary calculi: Secondary | ICD-10-CM | POA: Insufficient documentation

## 2017-01-04 NOTE — Patient Instructions (Addendum)
____________________________________________________________________________________________  Pain Scale  Introduction: The pain score used by this practice is the Verbal Numerical Rating Scale (VNRS-11). This is an 11-point scale. It is for adults and children 10 years or older. There are significant differences in how the pain score is reported, used, and applied. Forget everything you learned in the past and learn this scoring system.  General Information: The scale should reflect your current level of pain. Unless you are specifically asked for the level of your worst pain, or your average pain. If you are asked for one of these two, then it should be understood that it is over the past 24 hours.  Basic Activities of Daily Living (ADL): Personal hygiene, dressing, eating, transferring, and using restroom.  Instructions: Most patients tend to report their level of pain as a combination of two factors, their physical pain and their psychosocial pain. This last one is also known as "suffering" and it is reflection of how physical pain affects you socially and psychologically. From now on, report them separately. From this point on, when asked to report your pain level, report only your physical pain. Use the following table for reference.  Pain Clinic Pain Levels (0-5/10)  Pain Level Score  Description  No Pain 0   Mild pain 1 Nagging, annoying, but does not interfere with basic activities of daily living (ADL). Patients are able to eat, bathe, get dressed, toileting (being able to get on and off the toilet and perform personal hygiene functions), transfer (move in and out of bed or a chair without assistance), and maintain continence (able to control bladder and bowel functions). Blood pressure and heart rate are unaffected. A normal heart rate for a healthy adult ranges from 60 to 100 bpm (beats per minute).   Mild to moderate pain 2 Noticeable and distracting. Impossible to hide from other  people. More frequent flare-ups. Still possible to adapt and function close to normal. It can be very annoying and may have occasional stronger flare-ups. With discipline, patients may get used to it and adapt.   Moderate pain 3 Interferes significantly with activities of daily living (ADL). It becomes difficult to feed, bathe, get dressed, get on and off the toilet or to perform personal hygiene functions. Difficult to get in and out of bed or a chair without assistance. Very distracting. With effort, it can be ignored when deeply involved in activities.   Moderately severe pain 4 Impossible to ignore for more than a few minutes. With effort, patients may still be able to manage work or participate in some social activities. Very difficult to concentrate. Signs of autonomic nervous system discharge are evident: dilated pupils (mydriasis); mild sweating (diaphoresis); sleep interference. Heart rate becomes elevated (>115 bpm). Diastolic blood pressure (lower number) rises above 100 mmHg. Patients find relief in laying down and not moving.   Severe pain 5 Intense and extremely unpleasant. Associated with frowning face and frequent crying. Pain overwhelms the senses.  Ability to do any activity or maintain social relationships becomes significantly limited. Conversation becomes difficult. Pacing back and forth is common, as getting into a comfortable position is nearly impossible. Pain wakes you up from deep sleep. Physical signs will be obvious: pupillary dilation; increased sweating; goosebumps; brisk reflexes; cold, clammy hands and feet; nausea, vomiting or dry heaves; loss of appetite; significant sleep disturbance with inability to fall asleep or to remain asleep. When persistent, significant weight loss is observed due to the complete loss of appetite and sleep deprivation.  Blood   pressure and heart rate becomes significantly elevated. Caution: If elevated blood pressure triggers a pounding headache  associated with blurred vision, then the patient should immediately seek attention at an urgent or emergency care unit, as these may be signs of an impending stroke.    Emergency Department Pain Levels (6-10/10)  Emergency Room Pain 6 Severely limiting. Requires emergency care and should not be seen or managed at an outpatient pain management facility. Communication becomes difficult and requires great effort. Assistance to reach the emergency department may be required. Facial flushing and profuse sweating along with potentially dangerous increases in heart rate and blood pressure will be evident.   Distressing pain 7 Self-care is very difficult. Assistance is required to transport, or use restroom. Assistance to reach the emergency department will be required. Tasks requiring coordination, such as bathing and getting dressed become very difficult.   Disabling pain 8 Self-care is no longer possible. At this level, pain is disabling. The individual is unable to do even the most "basic" activities such as walking, eating, bathing, dressing, transferring to a bed, or toileting. Fine motor skills are lost. It is difficult to think clearly.   Incapacitating pain 9 Pain becomes incapacitating. Thought processing is no longer possible. Difficult to remember your own name. Control of movement and coordination are lost.   The worst pain imaginable 10 At this level, most patients pass out from pain. When this level is reached, collapse of the autonomic nervous system occurs, leading to a sudden drop in blood pressure and heart rate. This in turn results in a temporary and dramatic drop in blood flow to the brain, leading to a loss of consciousness. Fainting is one of the body's self defense mechanisms. Passing out puts the brain in a calmed state and causes it to shut down for a while, in order to begin the healing process.    Summary: 1. Refer to this scale when providing [place] with your pain level. 2. Be  accurate and careful when reporting your pain level. This will help with your care. 3. Over-reporting your pain level will lead to loss of credibility. 4. Even a level of 1/10 means that there is pain and will be treated at our facility. 5. High, inaccurate reporting will be documented as "Symptom Exaggeration", leading to loss of credibility and suspicions of possible secondary gains such as obtaining more narcotics, or wanting to appear disabled, for fraudulent reasons. 6. Only pain levels of 5 or below will be seen at our facility. 7. Pain levels of 6 and above will be sent to the Emergency Department and the appointment cancelled. ____________________________________________________________________________________________  Facet Blocks Patient Information  Description: The facets are joints in the spine between the vertebrae.  Like any joints in the body, facets can become irritated and painful.  Arthritis can also effect the facets.  By injecting steroids and local anesthetic in and around these joints, we can temporarily block the nerve supply to them.  Steroids act directly on irritated nerves and tissues to reduce selling and inflammation which often leads to decreased pain.  Facet blocks may be done anywhere along the spine from the neck to the low back depending upon the location of your pain.   After numbing the skin with local anesthetic (like Novocaine), a small needle is passed onto the facet joints under x-ray guidance.  You may experience a sensation of pressure while this is being done.  The entire block usually lasts about 15-25 minutes.   Conditions which  may be treated by facet blocks:   Low back/buttock pain  Neck/shoulder pain  Certain types of headaches  Preparation for the injection:  1. Do not eat any solid food or dairy products within 8 hours of your appointment. 2. You may drink clear liquid up to 3 hours before appointment.  Clear liquids include water, black  coffee, juice or soda.  No milk or cream please. 3. You may take your regular medication, including pain medications, with a sip of water before your appointment.  Diabetics should hold regular insulin (if taken separately) and take 1/2 normal NPH dose the morning of the procedure.  Carry some sugar containing items with you to your appointment. 4. A driver must accompany you and be prepared to drive you home after your procedure. 5. Bring all your current medications with you. 6. An IV may be inserted and sedation may be given at the discretion of the physician. 7. A blood pressure cuff, EKG and other monitors will often be applied during the procedure.  Some patients may need to have extra oxygen administered for a short period. 8. You will be asked to provide medical information, including your allergies and medications, prior to the procedure.  We must know immediately if you are taking blood thinners (like Coumadin/Warfarin) or if you are allergic to IV iodine contrast (dye).  We must know if you could possible be pregnant.  Possible side-effects:   Bleeding from needle site  Infection (rare, may require surgery)  Nerve injury (rare)  Numbness & tingling (temporary)  Difficulty urinating (rare, temporary)  Spinal headache (a headache worse with upright posture)  Light-headedness (temporary)  Pain at injection site (serveral days)  Decreased blood pressure (rare, temporary)  Weakness in arm/leg (temporary)  Pressure sensation in back/neck (temporary)   Call if you experience:   Fever/chills associated with headache or increased back/neck pain  Headache worsened by an upright position  New onset, weakness or numbness of an extremity below the injection site  Hives or difficulty breathing (go to the emergency room)  Inflammation or drainage at the injection site(s)  Severe back/neck pain greater than usual  New symptoms which are concerning to you  Please  note:  Although the local anesthetic injected can often make your back or neck feel good for several hours after the injection, the pain will likely return. It takes 3-7 days for steroids to work.  You may not notice any pain relief for at least one week.  If effective, we will often do a series of 2-3 injections spaced 3-6 weeks apart to maximally decrease your pain.  After the initial series, you may be a candidate for a more permanent nerve block of the facets.  If you have any questions, please call [phone] [place] not eat or drink 3 hours before your appointment.  BMI Assessment: Estimated body mass index is 25.32 kg/m as calculated from the following:   Height as of this encounter: 5\' 1"  (1.549 m).   Weight as of this encounter: 134 lb (60.8 kg).  BMI interpretation table: BMI level Category Range association with higher incidence of chronic pain  <18 kg/m2 Underweight   18.5-24.9 kg/m2 Ideal body weight   25-29.9 kg/m2 Overweight Increased incidence by 20%  30-34.9 kg/m2 Obese (Class I) Increased incidence by 68%  35-39.9 kg/m2 Severe obesity (Class II) Increased incidence by 136%  >40 kg/m2 Extreme obesity (Class III) Increased incidence by 254%   BMI  Readings from Last 4 Encounters:  01/04/17 25.32 kg/m  10/18/16 24.61 kg/m  09/20/16 25.37 kg/m  08/09/16 24.66 kg/m   Wt Readings from Last 4 Encounters:  01/04/17 134 lb (60.8 kg)  10/18/16 130 lb 4 oz (59.1 kg)  09/20/16 134 lb 4 oz (60.9 kg)  08/09/16 130 lb 8 oz (59.2 kg)

## 2017-01-04 NOTE — Progress Notes (Signed)
Patient's Name: Wanda [name]  MRN: [id number]  Referring Provider: Herminio Commons, MD  DOB: [birth year]  PCP: Herminio Commons, MD  DOS: 01/04/2017  Note by: Vevelyn Francois NP  Service setting: Ambulatory outpatient  Specialty: Interventional Pain Management  Location: [facility] (AMB) Pain Management Facility    Patient type: Established    Primary Reason(s) for Visit: Evaluation of chronic illnesses with exacerbation, or progression (Level of risk: moderate) CC: Back Pain (left, lower) and Headache  HPI  Wanda [name] is a 61 y.o. year old, female patient, who comes today for a follow-up evaluation. She has Hyperlipidemia; Hypokalemia; Anxiety and depression; Common migraine; SVT (supraventricular tachycardia) ([place]); Allergic rhinitis; Gastroesophageal reflux disease; Chronic constipation; Generalized osteoarthrosis of hand; Osteoporosis; Osteopenia; Mixed urinary incontinence; History of nephrolithiasis; Chronic prescription benzodiazepine use; Chronic low back pain (Location of Primary Source of Pain) (Bilateral) (R>L); Spinal cord stimulator status (battery on left buttocks); Lumbar spondylosis; Chronic pain of left lower extremity; Lumbar facet arthropathy ([place]); Lumbar facet syndrome (Location of Primary Source of Pain) (Bilateral) (R>L); Chronic lower extremity pain (Location of Secondary source of pain) (Right); Chronic hip pain (Location of Secondary source of pain) (Right); Osteoarthritis of hip (Right); Compression fracture of L1 lumbar vertebra [facility]) (old); Chronic neck pain; Closed fracture of hip ([place]); Orthostatic hypotension; Atypical chest pain; Chronic pain syndrome; and Closed intertrochanteric fracture ([place]) on her problem list. Wanda [name] was last seen on Visit date not found. Her primarily concern today is the Back Pain (left, lower) and Headache  Pain Assessment: Location: Left, Lower Back Radiating: left upper leg Onset: More than a month ago Duration: Chronic  pain Quality: Burning (stiff) Severity: 4 /10 (self-reported pain score)  Note: Reported level is compatible with observation.                   Effect on ADL:   Timing: Intermittent Modifying factors: lying down  Further details on both, my assessment(s), as well as the proposed treatment plan, please see below. She suffered a left femor fracture in Jan 2018. She admits that this was secondary to hypotension. She did undergo surgery. She is now having pain in her lower back and the SS site. She also states that she is having daily headaches. She states that as the day goes on the headache subsides. She does have it when she lays down at night She has been in PT. She has radicular symptoms into her knee.  She states the headaches restart each morning. She describe on the top of the head that goes down into the right. She admits that CT scan was performed in Jan but no MRI.  She denies any nausea or vomiting. She states that she has not rested since the fall in Jan. She is not able to get comfortable and sleep. She admits that her stimulator is working . She is not sure if there with the fall this may have disrupted something because it does turn it self on and off with movement.   Laboratory Chemistry  Inflammation Markers (CRP: Acute Phase) (ESR: Chronic Phase) No results found for: CRP, ESRSEDRATE               Renal Function Markers Lab Results  Component Value Date   BUN 6 (L) 10/18/2016   CREATININE 0.59 10/18/2016   GFRAA 115 10/18/2016   GFRNONAA 100 10/18/2016                 Hepatic Function Markers Lab  Results  Component Value Date   AST 36 08/09/2016   ALT 29 08/09/2016   ALBUMIN 4.6 08/09/2016   ALKPHOS 84 08/09/2016                 Electrolytes Lab Results  Component Value Date   NA 145 (H) 10/18/2016   K 3.6 10/18/2016   CL 105 10/18/2016   CALCIUM 9.5 10/18/2016   MG 2.1 08/09/2016                 Neuropathy Markers No results found for: [id number]                Bone Pathology Markers Lab Results  Component Value Date   ALKPHOS 84 08/09/2016   CALCIUM 9.5 10/18/2016                 Coagulation Parameters Lab Results  Component Value Date   PLT 196 06/02/2016                 Cardiovascular Markers Lab Results  Component Value Date   HGB 10.9 (L) 06/02/2016   HCT 30.3 (L) 06/02/2016                 Note: Lab results reviewed.  Recent Diagnostic Imaging Review  Nm Myocar Multi W/spect W/wall Motion / Ef  Result Date: 09/29/2016  Horizontal ST segment depression ST segment depression of 0.5 mm was noted during stress in the II, III, aVF, V5 and V6 leads.  The study is normal.  This is a low risk study.  The left ventricular ejection fraction is normal (55-65%).     Lumbosacral Imaging:  Lumbar MR w/wo contrast:  Results for orders placed during the hospital encounter of 08/07/12  MR Lumbar Spine W Wo Contrast   Narrative *RADIOLOGY REPORT*  Clinical Data: Increasing low back pain with bilateral hip and leg pain, weakness, and numbness. Increasing hypersensitivity in the left L4-5 nerve root distribution.  Decreased range of motion.   MRI LUMBAR SPINE WITHOUT AND WITH CONTRAST  Technique:  Multiplanar and multiecho pulse sequences of the lumbar spine were obtained without and with intravenous contrast.  Contrast: 58m MULTIHANCE GADOBENATE DIMEGLUMINE 529 MG/ML IV SOLN  Comparison: Lumbar MRIs dated 08/02/2010 and 01/08/2009   Findings: Normal conus tip is at L2.  Normal paraspinal soft tissues.  T11-12 through L2-3:  No significant abnormalities.  Old Schmorl's node in the superior plate of L1, unchanged.  Minimal disc bulging and osteophytes to the right at T12-L1 with no impingement, unchanged.  L3-4:  Tiny central disc bulge with new desiccation of the disc. No neural impingement.  Prior right hemilaminotomy. Slight degenerative changes of the facet joints, right greater than left, minimally  progressed.  L4-5:  Chronic marked disc space narrowing.  Tiny central disc bulge and osteophyte touching the ventral aspect of the thecal sac, unchanged, without neural impingement.  Previous laminotomies.  No neural impingement.  No pathologic enhancement after contrast.  L5-S1:  Previous left laminotomy. Disc bulge lateral to the right neural foramen which appears to have a mass effect upon the right L5 nerve lateral to the neural foramen.  Far lateral osteophyte formation to the left without impingement upon the left L5 nerve. These findings are unchanged. Slight right facet arthritis, unchanged.  IMPRESSION: 1.  Slight progression of mild degenerative facet arthritis at L3-4 on the right. 2.  Slight new degeneration of the L3-4 disc with a tiny central bulge with no neural impingement.  3.  Chronic right far lateral disc bulge at L5-S1 which could affect the right L5 nerve, unchanged since 01/08/2009.   Original Report Authenticated By: Lorriane Shire, M.D.   Lumbar DG Bending views:  Results for orders placed during the hospital encounter of 10/18/15  DG Lumbar Spine Complete W/Bend   Narrative CLINICAL DATA:  Right-sided low back pain, fell many years ago, no recent trauma  EXAM: LUMBAR SPINE - COMPLETE WITH BENDING VIEWS  COMPARISON:  MR lumbar spine of 08/07/2012, and lumbar spine plain films of 06/14/2010  FINDINGS: The lumbar vertebrae remain in normal alignment. There is degenerative disc disease most marked at L4-5 which has not changed significantly compared to the lateral lumbar spine film from 2012. A mild compression deformity of L1 appears old with degenerative spurring present. No acute abnormality is seen. Through flexion and extension there is very limited range of motion with no malalignment. Degenerate changes noted involve the facet joints particularly of L4-5 and L5-S1. A neurostimulator battery pack overlies the left buttock with electrodes  extending cephalad into the lower thoracic spinal canal.  IMPRESSION: 1. Normal alignment with little change in degenerative disc disease particularly at L4-5. 2. Old mild compression deformity of L1. 3. Very limited range of motion through flexion and extension.   Electronically Signed   By: Ivar Drape M.D.   On: 10/19/2015 08:30    Hip Imaging:  Hip-R DG 2-3 views:  Results for orders placed during the hospital encounter of 10/18/15  DG HIP UNILAT W OR W/O PELVIS 2-3 VIEWS RIGHT   Narrative CLINICAL DATA:  Low back pain, right greater than left, no acute trauma  EXAM: DG HIP (WITH OR WITHOUT PELVIS) 2-3V RIGHT  COMPARISON:  Pelvis and hip films of 08/07/2012  FINDINGS: There are degenerative changes in both hips right greater than left with slightly more loss of joint space on the right and spurring. No acute fracture is seen. The pelvic rami are intact. The SI joints are corticated.  IMPRESSION: Degenerative joint disease of the hips, right greater than left. No acute abnormality.   Electronically Signed   By: Ivar Drape M.D.   On: 10/19/2015 08:32    Hip-L DG 2-3 views:  Results for orders placed during the hospital encounter of 05/29/16  DG Hip Unilat W or Wo Pelvis 2-3 Views Left   Narrative CLINICAL DATA:  Fall with left hip pain  EXAM: DG HIP (WITH OR WITHOUT PELVIS) 2-3V LEFT  COMPARISON:  10/18/2015  FINDINGS: Left lower quadrant generator again noted. The right femoral head appears normally positioned and there are degenerative changes of the right hip as before.  Pubic symphysis appears intact. Mildly comminuted intertrochanteric fracture of the proximal left femur are with mild varus angulation. Left femoral head projects in joint.  IMPRESSION: Acute mildly angulated left intertrochanteric fracture.   Electronically Signed   By: [name] M.D.   On: 05/29/2016 03:35     Note: Results of ordered imaging test(s) reviewed and  explained to patient in Layman's terms. Copy of results provided to patient  Meds   Current Meds  Medication Sig  . alendronate (FOSAMAX) 10 MG tablet Take 10 mg by mouth daily before breakfast. Take with a full glass of water on an empty stomach.  Marland Kitchen aspirin EC 81 MG tablet Take 81 mg by mouth daily.  Marland Kitchen atorvastatin (LIPITOR) 40 MG tablet Take by mouth.  . Cholecalciferol (VITAMIN D3) 5000 units TABS Take 5,000 Units by mouth daily.  Marland Kitchen  clonazePAM (KLONOPIN) 0.5 MG tablet Take 1/2 tablet 3 times a day  . Garlic 0630 MG CAPS Take 1 capsule by mouth daily.  . Multiple Vitamin (MULTIVITAMIN) tablet Take 1 tablet by mouth daily.  . pantoprazole (PROTONIX) 40 MG tablet Take 40 mg by mouth 2 (two) times daily.   . potassium chloride SA (K-DUR,KLOR-CON) 20 MEQ tablet Take 2 tablets (40 mEq total) by mouth daily. (Patient taking differently: Take 40 mEq by mouth 2 (two) times daily. )  . QUEtiapine (SEROQUEL) 25 MG tablet Take 1-2 tablets by mouth at bedtime.  Marland Kitchen venlafaxine (EFFEXOR) 75 MG tablet Take 150 mg by mouth daily.     ROS  Constitutional: Denies any fever or chills Gastrointestinal: No reported hemesis, hematochezia, vomiting, or acute GI distress Musculoskeletal: Denies any acute onset joint swelling, redness, loss of ROM, or weakness Neurological: No reported episodes of acute onset apraxia, aphasia, dysarthria, agnosia, amnesia, paralysis, loss of coordination, or loss of consciousness  Allergies  Ms. Bowe is allergic to levofloxacin; mobic [meloxicam]; propoxyphene hcl; tramadol; and codeine.  [place]  Drug: Ms. Rogoff  reports that she does not use drugs. Alcohol:  reports that she drinks alcohol. Tobacco:  reports that she quit smoking about 26 years ago. Her smoking use included Cigarettes. She has a 3.50 pack-year smoking history. She quit smokeless tobacco use about 26 years ago. Medical:  has a past medical history of Anxiety; Borderline diabetes; BRONCHITIS, ACUTE WITH  BRONCHOSPASM (06/25/2010); Depression; GASTROENTERITIS WITHOUT DEHYDRATION (05/14/2009); GERD (gastroesophageal reflux disease); Herpes; Hip fx, left, closed, with nonunion, subsequent encounter (05/29/2016); Hyperlipidemia; and Irregular heart beat. Surgical: Ms. Harney  has a past surgical history that includes Shoulder surgery; Appendectomy; Abdominal hysterectomy; Back surgery; Spinal cord stimulator; Colonoscopy (N/A, 08/28/2013); Breast excisional biopsy (Right); and Intramedullary (im) nail intertrochanteric (Left, 05/30/2016). Family: family history includes Alcohol abuse in her daughter; COPD in her father and mother; Cancer in her cousin and paternal aunt; Depression in her daughter; Diabetes in her cousin, father, mother, paternal grandmother, and sister; Heart attack (age of onset: 58) in her father; Heart disease in her father and mother; Tuberculosis in her maternal aunt.  Constitutional Exam  General appearance: Well nourished, well developed, and well hydrated. In no apparent acute distress Vitals:   01/04/17 0932  BP: 121/80  Pulse: 83  Resp: 16  Temp: 97.9 F (36.6 C)  TempSrc: Oral  SpO2: 100%  Weight: 134 lb (60.8 kg)  Height: _0  (1.549 m)   BMI Assessment: Estimated body mass index is 25.32 kg/m as calculated from the following:   Height as of this encounter: _1  (1.549 m).   Weight as of this encounter: 134 lb (60.8 kg).  BMI interpretation table: BMI level Category Range association with higher incidence of chronic pain  <18 kg/m2 Underweight   18.5-24.9 kg/m2 Ideal body weight   25-29.9 kg/m2 Overweight Increased incidence by 20%  30-34.9 kg/m2 Obese (Class I) Increased incidence by 68%  35-39.9 kg/m2 Severe obesity (Class II) Increased incidence by 136%  >40 kg/m2 Extreme obesity (Class III) Increased incidence by 254%   BMI Readings from Last 4 Encounters:  01/04/17 25.32 kg/m  10/18/16 24.61 kg/m  09/20/16 25.37 kg/m  08/09/16 24.66 kg/m   Wt  Readings from Last 4 Encounters:  01/04/17 134 lb (60.8 kg)  10/18/16 130 lb 4 oz (59.1 kg)  09/20/16 134 lb 4 oz (60.9 kg)  08/09/16 130 lb 8 oz (59.2 kg)  Psych/Mental status: Alert, oriented x 3 (person,  place, & time)       Eyes: PERLA Respiratory: No evidence of acute respiratory distress  Cervical Spine Area Exam  Skin & Axial Inspection: No masses, redness, edema, swelling, or associated skin lesions Alignment: Symmetrical Functional ROM: Unrestricted ROM      Stability: No instability detected Muscle Tone/Strength: Functionally intact. No obvious neuro-muscular anomalies detected. Sensory (Neurological): Unimpaired Palpation: No palpable anomalies              Upper Extremity (UE) Exam    Side: Right upper extremity  Side: Left upper extremity  Skin & Extremity Inspection: Skin color, temperature, and hair growth are WNL. No peripheral edema or cyanosis. No masses, redness, swelling, asymmetry, or associated skin lesions. No contractures.  Skin & Extremity Inspection: Skin color, temperature, and hair growth are WNL. No peripheral edema or cyanosis. No masses, redness, swelling, asymmetry, or associated skin lesions. No contractures.  Functional ROM: Unrestricted ROM          Functional ROM: Unrestricted ROM          Muscle Tone/Strength: Functionally intact. No obvious neuro-muscular anomalies detected.  Muscle Tone/Strength: Functionally intact. No obvious neuro-muscular anomalies detected.  Sensory (Neurological): Unimpaired  Sensory (Neurological): Unimpaired  Palpation: No palpable anomalies              Palpation: No palpable anomalies              Specialized Test(s): Deferred         Specialized Test(s): Deferred          Thoracic Spine Area Exam  Skin & Axial Inspection: No masses, redness, or swelling Alignment: Symmetrical Functional ROM: Unrestricted ROM Stability: No instability detected Muscle Tone/Strength: Functionally intact. No obvious neuro-muscular  anomalies detected. Sensory (Neurological): Unimpaired Muscle strength & Tone: No palpable anomalies  Lumbar Spine Area Exam  Skin & Axial Inspection: Well healed scar from previous spine surgery detected Alignment: Symmetrical Functional ROM: Unrestricted ROM      Stability: No instability detected Muscle Tone/Strength: Increased muscle tone over affected area Sensory (Neurological): Unimpaired Palpation: Complains of area being tender to palpation       Provocative Tests: Lumbar Hyperextension and rotation test: Positive bilaterally for facet joint pain. Lumbar Lateral bending test: Positive       Patrick's Maneuver: Unable to perform on the left secondary to decreased ROM                    Gait & Posture Assessment  Ambulation: Unassisted Gait: Uneven Posture: WNL   Lower Extremity Exam    Side: Right lower extremity  Side: Left lower extremity  Skin & Extremity Inspection: Skin color, temperature, and hair growth are WNL. No peripheral edema or cyanosis. No masses, redness, swelling, asymmetry, or associated skin lesions. No contractures.  Skin & Extremity Inspection: Skin color, temperature, and hair growth are WNL. No peripheral edema or cyanosis. No masses, redness, swelling, asymmetry, or associated skin lesions. No contractures.  Functional ROM: Unrestricted ROM          Functional ROM: Restricted ROM          Muscle Tone/Strength: Functionally intact. No obvious neuro-muscular anomalies detected.  Muscle Tone/Strength: Able to Toe-walk Unable to Heel-walk   Sensory (Neurological): Unimpaired  Sensory (Neurological): Unimpaired  Palpation: No palpable anomalies  Palpation: No palpable anomalies   Assessment  Primary Diagnosis & Pertinent Problem List: The primary encounter diagnosis was Lumbar facet syndrome (Location of Primary Source of Pain) (Bilateral) (R>L).  Diagnoses of Lumbar facet arthropathy ([place]), Chronic neck pain, Lumbar spondylosis, and Chronic pain syndrome  were also pertinent to this visit.  Status Diagnosis  Controlled Controlled Controlled 1. Lumbar facet syndrome (Location of Primary Source of Pain) (Bilateral) (R>L)   2. Lumbar facet arthropathy ([facility])   3. Chronic neck pain   4. Lumbar spondylosis   5. Chronic pain syndrome     Problems updated and reviewed during this visit: Problem  Chronic Pain Syndrome  Closed Intertrochanteric Fracture ([facility])  Closed Fracture of Hip ([facility])  Chronic Neck Pain   Plan of Care  Pharmacotherapy (Medications Ordered): No orders of the defined types were placed in this encounter.  New Prescriptions   No medications on file   Medications administered today: Ms. Holsapple had no medications administered during this visit. Lab-work, procedure(s), and/or referral(s): Orders Placed This Encounter  Procedures  . LUMBAR FACET(MEDIAL BRANCH NERVE BLOCK) MBNB  . DG Cervical Spine Complete   Imaging and/or referral(s): DG CERVICAL SPINE COMPLETE  Interventional Therapies: Scheduled:Diagnostic bilateral lumbar facet block under fluoroscopic guidance and IV sedation.    Considering:Lumbar facet radiofrequency ablation    PRN Procedures:None at this time.    Provider-requested follow-up: No Follow-up on file.  Future Appointments Date Time Provider [place]  01/18/2017 2:40 PM [facility]  02/06/2017 1:20 PM End, Harrell Gave, MD [id number]   Primary Care Physician: Herminio Commons, MD Location: [facility] Note by: Vevelyn Francois NP Date: 01/04/2017; Time: 10:25 AM  Pain Score Disclaimer: We use the NRS-11 scale. This is a self-reported, subjective measurement of pain severity with only modest accuracy. It is used primarily to identify changes within a particular patient. It must be understood that outpatient pain scales are significantly less accurate that those used for research, where they can be applied under ideal  controlled circumstances with minimal exposure to variables. In reality, the score is likely to be a combination of pain intensity and pain affect, where pain affect describes the degree of emotional arousal or changes in action readiness caused by the sensory experience of pain. Factors such as social and work situation, setting, emotional state, anxiety levels, expectation, and prior pain experience may influence pain perception and show large inter-individual differences that may also be affected by time variables.  Patient instructions provided during this appointment: Patient Instructions   ____________________________________________________________________________________________  Pain Scale  Introduction: The pain score used by this practice is the Verbal Numerical Rating Scale (VNRS-11). This is an 11-point scale. It is for adults and children 10 years or older. There are significant differences in how the pain score is reported, used, and applied. Forget everything you learned in the past and learn this scoring system.  General Information: The scale should reflect your current level of pain. Unless you are specifically asked for the level of your worst pain, or your average pain. If you are asked for one of these two, then it should be understood that it is over the past 24 hours.  Basic Activities of Daily Living (ADL): Personal hygiene, dressing, eating, transferring, and using restroom.  Instructions: Most patients tend to report their level of pain as a combination of two factors, their physical pain and their psychosocial pain. This last one is also known as "suffering" and it is reflection of how physical pain affects you socially and psychologically. From now on, report them separately. From this point on, when asked to report your pain level, report only your physical  pain. Use the following table for reference.  Pain Clinic Pain Levels (0-5/10)  Pain Level Score  Description   No Pain 0   Mild pain 1 Nagging, annoying, but does not interfere with basic activities of daily living (ADL). Patients are able to eat, bathe, get dressed, toileting (being able to get on and off the toilet and perform personal hygiene functions), transfer (move in and out of bed or a chair without assistance), and maintain continence (able to control bladder and bowel functions). Blood pressure and heart rate are unaffected. A normal heart rate for a healthy adult ranges from 60 to 100 bpm (beats per minute).   Mild to moderate pain 2 Noticeable and distracting. Impossible to hide from other people. More frequent flare-ups. Still possible to adapt and function close to normal. It can be very annoying and may have occasional stronger flare-ups. With discipline, patients may get used to it and adapt.   Moderate pain 3 Interferes significantly with activities of daily living (ADL). It becomes difficult to feed, bathe, get dressed, get on and off the toilet or to perform personal hygiene functions. Difficult to get in and out of bed or a chair without assistance. Very distracting. With effort, it can be ignored when deeply involved in activities.   Moderately severe pain 4 Impossible to ignore for more than a few minutes. With effort, patients may still be able to manage work or participate in some social activities. Very difficult to concentrate. Signs of autonomic nervous system discharge are evident: dilated pupils (mydriasis); mild sweating (diaphoresis); sleep interference. Heart rate becomes elevated (>115 bpm). Diastolic blood pressure (lower number) rises above 100 mmHg. Patients find relief in laying down and not moving.   Severe pain 5 Intense and extremely unpleasant. Associated with frowning face and frequent crying. Pain overwhelms the senses.  Ability to do any activity or maintain social relationships becomes significantly limited. Conversation becomes difficult. Pacing back and forth is  common, as getting into a comfortable position is nearly impossible. Pain wakes you up from deep sleep. Physical signs will be obvious: pupillary dilation; increased sweating; goosebumps; brisk reflexes; cold, clammy hands and feet; nausea, vomiting or dry heaves; loss of appetite; significant sleep disturbance with inability to fall asleep or to remain asleep. When persistent, significant weight loss is observed due to the complete loss of appetite and sleep deprivation.  Blood pressure and heart rate becomes significantly elevated. Caution: If elevated blood pressure triggers a pounding headache associated with blurred vision, then the patient should immediately seek attention at an urgent or emergency care unit, as these may be signs of an impending stroke.    Emergency Department Pain Levels (6-10/10)  Emergency Room Pain 6 Severely limiting. Requires emergency care and should not be seen or managed at an outpatient pain management facility. Communication becomes difficult and requires great effort. Assistance to reach the emergency department may be required. Facial flushing and profuse sweating along with potentially dangerous increases in heart rate and blood pressure will be evident.   Distressing pain 7 Self-care is very difficult. Assistance is required to transport, or use restroom. Assistance to reach the emergency department will be required. Tasks requiring coordination, such as bathing and getting dressed become very difficult.   Disabling pain 8 Self-care is no longer possible. At this level, pain is disabling. The individual is unable to do even the most "basic" activities such as walking, eating, bathing, dressing, transferring to a bed, or toileting. Fine motor skills are lost.  It is difficult to think clearly.   Incapacitating pain 9 Pain becomes incapacitating. Thought processing is no longer possible. Difficult to remember your own name. Control of movement and coordination are  lost.   The worst pain imaginable 10 At this level, most patients pass out from pain. When this level is reached, collapse of the autonomic nervous system occurs, leading to a sudden drop in blood pressure and heart rate. This in turn results in a temporary and dramatic drop in blood flow to the brain, leading to a loss of consciousness. Fainting is one of the body's self defense mechanisms. Passing out puts the brain in a calmed state and causes it to shut down for a while, in order to begin the healing process.    Summary: 1. Refer to this scale when providing [place] with your pain level. 2. Be accurate and careful when reporting your pain level. This will help with your care. 3. Over-reporting your pain level will lead to loss of credibility. 4. Even a level of 1/10 means that there is pain and will be treated at our facility. 5. High, inaccurate reporting will be documented as "Symptom Exaggeration", leading to loss of credibility and suspicions of possible secondary gains such as obtaining more narcotics, or wanting to appear disabled, for fraudulent reasons. 6. Only pain levels of 5 or below will be seen at our facility. 7. Pain levels of 6 and above will be sent to the Emergency Department and the appointment cancelled. ____________________________________________________________________________________________

## 2017-01-04 NOTE — Progress Notes (Signed)
Patient's Name: Wanda [name]  MRN: [id number]  Referring Provider: Herminio Commons, MD  DOB: 09-14-1955  PCP: Wanda Commons, MD  DOS: 01/04/2017  Note by: Wanda Francois NP  Service setting: Ambulatory outpatient  Specialty: Interventional Pain Management  Location: [facility] (AMB) Pain Management Facility    Patient type: Established    Primary Reason(s) for Visit: Encounter for prescription drug management & post-procedure evaluation of chronic illness with mild to moderate exacerbation(Level of risk: moderate) CC: Back Pain (left, lower) and Headache  HPI  Wanda [name] is a 61 y.o. year old, female patient, who comes today for a post-procedure evaluation and medication management. She has Hyperlipidemia; Hypokalemia; Anxiety and depression; Common migraine; SVT (supraventricular tachycardia) ([place]); Allergic rhinitis; Gastroesophageal reflux disease; Chronic constipation; Generalized osteoarthrosis of hand; Osteoporosis; Osteopenia; Mixed urinary incontinence; History of nephrolithiasis; Chronic prescription benzodiazepine use; Chronic low back pain (Location of Primary Source of Pain) (Bilateral) (R>[name]); Spinal cord stimulator status (battery on left buttocks); Lumbar spondylosis; Chronic pain of left lower extremity; Lumbar facet arthropathy ([place]); Lumbar facet syndrome (Location of Primary Source of Pain) (Bilateral) (R>[name]); Chronic lower extremity pain (Location of Secondary source of pain) (Right); Chronic hip pain (Location of Secondary source of pain) (Right); Osteoarthritis of hip (Right); Compression fracture of L1 lumbar vertebra [facility]) (old); Chronic neck pain; Closed fracture of hip ([place]); Orthostatic hypotension; Atypical chest pain; Chronic pain syndrome; and Closed intertrochanteric fracture ([place]) on her problem list. Her primarily concern today is the Back Pain (left, lower) and Headache  Pain Assessment: Location: Left, Lower Back Radiating: left upper leg Onset: More than a  month ago Duration: Chronic pain Quality: Burning (stiff) Severity: 4 /10 (self-reported pain score)  Note: Reported level is compatible with observation.                   Effect on ADL:   Timing: Intermittent Modifying factors: lying down and [employer]  Wanda [name] was last seen on 04/05/16 for a procedure. During today's appointment we reviewed Wanda [name] post-procedure results. She failed to return for her post procedure follow 2 weeks after procedure. She suffered a fall Jan, 01 2018 which resulted in a left femor fracture.. She admits that this was secondary to hypotension. She did undergo surgery. She is now having pain in her lower back and at the spinal cord stimulator site. She has radicular symptoms into her knee. She admits that her stimulator is working however it does turn it self on and off with certain movements . She is not sure if there was disrupted with the fall. She admits that it has helped with her pain.  She did have PT post surgery .  She also states that she is having daily headaches. She states the headaches restart each morning. She states that as the day goes on the headache subsides. She feels like they increase when she lays down at night.  She describe on the top of the head that goes down into the right. She admits that CT scan was performed in Jan post fall but no MRI.  She denies any nausea or vomiting. She states that she has not rested since the fall in Jan. She is not able to get comfortable and sleep. She does have popping inher neck with different movements but denies any neck, shoulder or upper extremity pain, numbness, tingling or weakness.    Controlled Substance Pharmacotherapy Assessment REMS (Risk Evaluation and Mitigation Strategy)  Analgesic: None MME/day:0 mg/day.  Wanda [name], [place]  L, RN  01/04/2017  9:33 AM  Sign at close encounter Safety precautions to be maintained throughout the outpatient stay will include: orient to surroundings, keep bed in low  position, maintain call bell within reach at all times, provide assistance with transfer out of bed and ambulation.    Pharmacokinetics: Liberation and absorption (onset of action): WNL Distribution (time to peak effect): WNL Metabolism and excretion (duration of action): WNL         Pharmacodynamics: Desired effects: Analgesia: Wanda [name] reports >50% benefit. Functional ability: Patient reports that medication allows her to accomplish basic ADLs Clinically meaningful improvement in function (CMIF): Sustained CMIF goals met Perceived effectiveness: Described as relatively effective, allowing for increase in activities of daily living (ADL) Undesirable effects: Side-effects or Adverse reactions: None reported Monitoring: [place] PMP: Online review of the past 14-monthperiod conducted. Compliant with practice rules and regulations List of all UDS test(s) done:  No results found for: TOXASSSELUR, SUMMARY Last UDS on record: No results found for: TOXASSSELUR, SUMMARY UDS interpretation: Not applicable          Medication Assessment Form: Not applicable.The patient has not received any medications from our practice Treatment compliance: Not applicable.  Risk Assessment Profile: Aberrant behavior: See prior evaluations. None observed or detected today Comorbid factors increasing risk of overdose: See prior notes. No additional risks detected today Risk of substance use disorder (SUD): Low     Opioid Risk Tool - 01/04/17 0937      Family History of Substance Abuse   Alcohol Positive Female   Illegal Drugs Positive Female   Rx Drugs Positive Female or Female     Personal History of Substance Abuse   Alcohol Positive Female or Female   Illegal Drugs Negative   Rx Drugs Negative     Age   Age between 61-45years  No     History of Preadolescent Sexual Abuse   History of Preadolescent Sexual Abuse Negative or Female     Psychological Disease   Psychological Disease Negative    Depression Positive     Total Score   Opioid Risk Tool Scoring 11   Opioid Risk Interpretation High Risk     ORT Scoring interpretation table:  Score <3 = Low Risk for SUD  Score between 4-7 = Moderate Risk for SUD  Score >8 = High Risk for Opioid Abuse   Risk Mitigation Strategies:  Patient Counseling: Covered Patient-Prescriber Agreement (PPA): Present and active  Notification to other healthcare providers: Done  Pharmacologic Plan: No change in therapy, at this time  Post-Procedure Assessment  Visit date not found Procedure: 04/05/16 Pre-procedure pain score:  4/10 Post-procedure pain score: 0/10         Influential Factors: BMI: 25.32 kg/m Intra-procedural challenges: None observed.         Assessment challenges: None detected.              Reported side-effects: None.        Post-procedural adverse reactions or complications: None reported         Sedation: Please see nurses note. When no sedatives are used, the analgesic levels obtained are directly associated to the effectiveness of the local anesthetics. However, when sedation is provided, the level of analgesia obtained during the initial 1 hour following the intervention, is believed to be the result of a combination of factors. These factors may include, but are not limited to: 1. The effectiveness of the local anesthetics used. 2. The effects of  the analgesic(s) and/or anxiolytic(s) used. 3. The degree of discomfort experienced by the patient at the time of the procedure. 4. The patients ability and reliability in recalling and recording the events. 5. The presence and influence of possible secondary gains and/or psychosocial factors. Reported result: Relief experienced during the 1st hour after the procedure: 100 % (Ultra-Short Term Relief)            Interpretative annotation: Clinically appropriate result. Analgesia during this period is likely to be Local Anesthetic and/or IV Sedative (Analgesic/Anxiolytic)  related.          Effects of local anesthetic: The analgesic effects attained during this period are directly associated to the localized infiltration of local anesthetics and therefore cary significant diagnostic value as to the etiological location, or anatomical origin, of the pain. Expected duration of relief is directly dependent on the pharmacodynamics of the local anesthetic used. Long-acting (4-6 hours) anesthetics used.  Reported result: Relief during the next 4 to 6 hour after the procedure: 100 % (Short-Term Relief)            Interpretative annotation: Clinically appropriate result. Analgesia during this period is likely to be Local Anesthetic-related.          Long-term benefit: Defined as the period of time past the expected duration of local anesthetics (1 hour for short-acting and 4-6 hours for long-acting). With the possible exception of prolonged sympathetic blockade from the local anesthetics, benefits during this period are typically attributed to, or associated with, other factors such as analgesic sensory neuropraxia, antiinflammatory effects, or beneficial biochemical changes provided by agents other than the local anesthetics.  Reported result: Extended relief following procedure: 85 % (Long-Term Relief)            Interpretative annotation: Clinically appropriate result. Good relief. No permanent benefit expected. Inflammation plays a part in the etiology to the pain.          Current benefits: Defined as persistent relief that continues at this point in time.   Reported results: Treated area: <50 %       Interpretative annotation: Recurrence of symptoms.    Results would suggest further treatment needed.          Interpretation: Results would suggest    We'll proceed with the next treatment, as soon as convenient          Plan:  Proceed with diagnostic procedure # 2.  Laboratory Chemistry  Inflammation Markers (CRP: Acute Phase) (ESR: Chronic Phase) No results found  for: CRP, ESRSEDRATE               Renal Function Markers Lab Results  Component Value Date   BUN 6 ([name]) 10/18/2016   CREATININE 0.59 10/18/2016   GFRAA 115 10/18/2016   GFRNONAA 100 10/18/2016                 Hepatic Function Markers Lab Results  Component Value Date   AST 36 08/09/2016   ALT 29 08/09/2016   ALBUMIN 4.6 08/09/2016   ALKPHOS 84 08/09/2016                 Electrolytes Lab Results  Component Value Date   NA 145 (H) 10/18/2016   K 3.6 10/18/2016   CL 105 10/18/2016   CALCIUM 9.5 10/18/2016   MG 2.1 08/09/2016                 Neuropathy Markers No results found for: [id number]  Bone Pathology Markers Lab Results  Component Value Date   ALKPHOS 84 08/09/2016   CALCIUM 9.5 10/18/2016                 Coagulation Parameters Lab Results  Component Value Date   PLT 196 06/02/2016                 Cardiovascular Markers Lab Results  Component Value Date   HGB 10.9 ([name]) 06/02/2016   HCT 30.3 ([name]) 06/02/2016                 Note: Lab results reviewed.  Recent Diagnostic Imaging Review  No results found. Note: Imaging results reviewed.          Meds   Current Meds  Medication Sig  . alendronate (FOSAMAX) 10 MG tablet Take 10 mg by mouth daily before breakfast. Take with a full glass of water on an empty stomach.  Marland Kitchen aspirin EC 81 MG tablet Take 81 mg by mouth daily.  Marland Kitchen atorvastatin (LIPITOR) 40 MG tablet Take by mouth.  . Cholecalciferol (VITAMIN D3) 5000 units TABS Take 5,000 Units by mouth daily.  . clonazePAM (KLONOPIN) 0.5 MG tablet Take 1/2 tablet 3 times a day  . Garlic 6283 MG CAPS Take 1 capsule by mouth daily.  . Multiple Vitamin (MULTIVITAMIN) tablet Take 1 tablet by mouth daily.  . pantoprazole (PROTONIX) 40 MG tablet Take 40 mg by mouth 2 (two) times daily.   . potassium chloride SA (K-DUR,KLOR-CON) 20 MEQ tablet Take 2 tablets (40 mEq total) by mouth daily. (Patient taking differently: Take 40 mEq by mouth 2 (two) times  daily. )  . QUEtiapine (SEROQUEL) 25 MG tablet Take 1-2 tablets by mouth at bedtime.  Marland Kitchen venlafaxine (EFFEXOR) 75 MG tablet Take 150 mg by mouth daily.     ROS  Constitutional: Denies any fever or chills Gastrointestinal: No reported hemesis, hematochezia, vomiting, or acute GI distress Musculoskeletal: Denies any acute onset joint swelling, redness, loss of ROM, or weakness Neurological: No reported episodes of acute onset apraxia, aphasia, dysarthria, agnosia, amnesia, paralysis, loss of coordination, or loss of consciousness  Allergies  Ms. Arocho is allergic to levofloxacin; mobic [meloxicam]; propoxyphene hcl; tramadol; and codeine.  [place]  Drug: Ms. Baksh  reports that she does not use drugs. Alcohol:  reports that she drinks alcohol. Tobacco:  reports that she quit smoking about 26 years ago. Her smoking use included Cigarettes. She has a 3.50 pack-year smoking history. She quit smokeless tobacco use about 26 years ago. 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 Surgical: Ms. Gatti  has a past surgical history that includes Shoulder surgery; Appendectomy; Abdominal hysterectomy; Back surgery; Spinal cord stimulator; Colonoscopy (N/A, 08/28/2013); Breast excisional biopsy (Right); and Intramedullary (im) nail intertrochanteric (Left, 05/30/2016). Family: family history includes Alcohol abuse in her daughter; COPD in her father and mother; Cancer in her cousin and paternal aunt; Depression in her daughter; Diabetes in her cousin, father, mother, paternal grandmother, and sister; Heart attack (age of onset: 64) in her father; Heart disease in her father and mother; Tuberculosis in her maternal  aunt.  Constitutional Exam  General appearance: Well nourished, well developed, and well hydrated. In no apparent acute distress Vitals:   01/04/17 0932  BP: 121/80  Pulse: 83  Resp: 16  Temp: 97.9 F (36.6 C)  TempSrc: Oral  SpO2: 100%  Weight: 134 lb (60.8 kg)  Height: _0  (1.549 m)   BMI Assessment: Estimated body mass index is 25.32 kg/m as calculated from the following:   Height as of this encounter: _1  (1.549 m).   Weight as of this encounter: 134 lb (60.8 kg).  Psych/Mental status: Alert, oriented x 3 (person, place, & time)       Eyes: PERLA Respiratory: No evidence of acute respiratory distress  Cervical Spine Area Exam  Skin & Axial Inspection: No masses, redness, edema, swelling, or associated skin lesions Alignment: Symmetrical Functional ROM: Unrestricted ROM      Stability: No instability detected Muscle Tone/Strength: Functionally intact. No obvious neuro-muscular anomalies detected. Sensory (Neurological): Unimpaired Palpation: No palpable anomalies              Upper Extremity (UE) Exam    Side: Right upper extremity  Side: Left upper extremity  Skin & Extremity Inspection: Skin color, temperature, and hair growth are WNL. No peripheral edema or cyanosis. No masses, redness, swelling, asymmetry, or associated skin lesions. No contractures.  Skin & Extremity Inspection: Skin color, temperature, and hair growth are WNL. No peripheral edema or cyanosis. No masses, redness, swelling, asymmetry, or associated skin lesions. No contractures.  Functional ROM: Unrestricted ROM          Functional ROM: Unrestricted ROM          Muscle Tone/Strength: Functionally intact. No obvious neuro-muscular anomalies detected.  Muscle Tone/Strength: Functionally intact. No obvious neuro-muscular anomalies detected.  Sensory (Neurological): Unimpaired  Sensory (Neurological): Unimpaired  Palpation: No palpable anomalies              Palpation: No palpable anomalies               Specialized Test(s): Deferred         Specialized Test(s): Deferred          Thoracic Spine Area Exam  Skin & Axial Inspection: No masses, redness, or swelling Alignment: Symmetrical Functional ROM: Unrestricted ROM Stability: No instability detected Muscle Tone/Strength: Functionally intact. No obvious neuro-muscular anomalies detected. Sensory (Neurological): Unimpaired Muscle strength & Tone: No palpable anomalies  Lumbar Spine Area Exam  Skin & Axial Inspection: No masses, redness, or swelling Alignment: Symmetrical Functional ROM: Unrestricted ROM      Stability: No instability detected Muscle Tone/Strength: Functionally intact. No obvious neuro-muscular anomalies detected. Sensory (Neurological): Unimpaired Palpation: No palpable anomalies       Provocative Tests: Lumbar Hyperextension and rotation test: evaluation deferred today       Lumbar Lateral bending test: evaluation deferred today       Patrick's Maneuver: evaluation deferred today                    Gait & Posture Assessment  Ambulation: Unassisted Gait: Relatively normal for age and body habitus Posture: WNL   Lower Extremity Exam    Side: Right lower extremity  Side: Left lower extremity  Skin & Extremity Inspection: Skin color, temperature, and hair growth are WNL. No peripheral edema or cyanosis. No masses, redness, swelling, asymmetry, or associated skin lesions. No contractures.  Skin & Extremity Inspection: Skin color, temperature, and hair growth are WNL. No peripheral edema or cyanosis. No masses, redness, swelling, asymmetry, or associated skin lesions. No contractures.  Functional ROM: Unrestricted ROM          Functional ROM: Unrestricted ROM  Muscle Tone/Strength: Functionally intact. No obvious neuro-muscular anomalies detected.  Muscle Tone/Strength: Functionally intact. No obvious neuro-muscular anomalies detected.  Sensory (Neurological): Unimpaired  Sensory (Neurological): Unimpaired   Palpation: No palpable anomalies  Palpation: No palpable anomalies   Assessment  Primary Diagnosis & Pertinent Problem List: The primary encounter diagnosis was Lumbar facet syndrome (Location of Primary Source of Pain) (Bilateral) (R>[name]). Diagnoses of Lumbar facet arthropathy ([place]), Chronic neck pain, Lumbar spondylosis, and Chronic pain syndrome were also pertinent to this visit.  Status Diagnosis  Controlled Controlled Controlled 1. Lumbar facet syndrome (Location of Primary Source of Pain) (Bilateral) (R>[name])   2. Lumbar facet arthropathy ([facility])   3. Chronic neck pain   4. Lumbar spondylosis   5. Chronic pain syndrome     Problems updated and reviewed during this visit: Problem  Chronic Pain Syndrome  Closed Intertrochanteric Fracture ([facility])  Closed Fracture of Hip ([facility])  Chronic Neck Pain   Plan of Care  Pharmacotherapy (Medications Ordered): No orders of the defined types were placed in this encounter.  New Prescriptions   No medications on file   Medications administered today: Ms. Ruegg had no medications administered during this visit. Lab-work, procedure(s), and/or referral(s): Orders Placed This Encounter  Procedures  . LUMBAR FACET(MEDIAL BRANCH NERVE BLOCK) MBNB  . DG Cervical Spine Complete   Imaging and/or referral(s): None  Interventional therapies: Planned, scheduled, and/or pending:   Left side Lumbar facet no sedation   Considering:   Bilateral radiofrequency ablation.  Possible left femoral nerve block   PRN Procedures:   None at this time    Provider-requested follow-up: Return for w/ Dr. Dossie Arbour, Procedure(w/Sedation).  Future Appointments Date Time Provider Alex  01/18/2017 2:40 PM [facility] 2 [facility]  02/06/2017 1:20 PM End, Harrell Gave, MD [id number]   Primary Care Physician: Wanda Commons, MD Location: [facility] Note by: Wanda Francois NP Date: 01/04/2017; Time: 11:59  AM  Pain Score Disclaimer: We use the NRS-11 scale. This is a self-reported, subjective measurement of pain severity with only modest accuracy. It is used primarily to identify changes within a particular patient. It must be understood that outpatient pain scales are significantly less accurate that those used for research, where they can be applied under ideal controlled circumstances with minimal exposure to variables. In reality, the score is likely to be a combination of pain intensity and pain affect, where pain affect describes the degree of emotional arousal or changes in action readiness caused by the sensory experience of pain. Factors such as social and work situation, setting, emotional state, anxiety levels, expectation, and prior pain experience may influence pain perception and show large inter-individual differences that may also be affected by time variables.  Patient instructions provided during this appointment: Patient Instructions   ____________________________________________________________________________________________  Pain Scale  Introduction: The pain score used by this practice is the Verbal Numerical Rating Scale (VNRS-11). This is an 11-point scale. It is for adults and children 10 years or older. There are significant differences in how the pain score is reported, used, and applied. Forget everything you learned in the past and learn this scoring system.  General Information: The scale should reflect your current level of pain. Unless you are specifically asked for the level of your worst pain, or your average pain. If you are asked for one of these two, then it should be understood that it is over the past 24 hours.  Basic Activities of Daily Living (ADL): Personal hygiene,  dressing, eating, transferring, and using restroom.  Instructions: Most patients tend to report their level of pain as a combination of two factors, their physical pain and their psychosocial  pain. This last one is also known as "suffering" and it is reflection of how physical pain affects you socially and psychologically. From now on, report them separately. From this point on, when asked to report your pain level, report only your physical pain. Use the following table for reference.  Pain Clinic Pain Levels (0-5/10)  Pain Level Score  Description  No Pain 0   Mild pain 1 Nagging, annoying, but does not interfere with basic activities of daily living (ADL). Patients are able to eat, bathe, get dressed, toileting (being able to get on and off the toilet and perform personal hygiene functions), transfer (move in and out of bed or a chair without assistance), and maintain continence (able to control bladder and bowel functions). Blood pressure and heart rate are unaffected. A normal heart rate for a healthy adult ranges from 60 to 100 bpm (beats per minute).   Mild to moderate pain 2 Noticeable and distracting. Impossible to hide from other people. More frequent flare-ups. Still possible to adapt and function close to normal. It can be very annoying and may have occasional stronger flare-ups. With discipline, patients may get used to it and adapt.   Moderate pain 3 Interferes significantly with activities of daily living (ADL). It becomes difficult to feed, bathe, get dressed, get on and off the toilet or to perform personal hygiene functions. Difficult to get in and out of bed or a chair without assistance. Very distracting. With effort, it can be ignored when deeply involved in activities.   Moderately severe pain 4 Impossible to ignore for more than a few minutes. With effort, patients may still be able to manage work or participate in some social activities. Very difficult to concentrate. Signs of autonomic nervous system discharge are evident: dilated pupils (mydriasis); mild sweating (diaphoresis); sleep interference. Heart rate becomes elevated (>115 bpm). Diastolic blood pressure  (lower number) rises above 100 mmHg. Patients find relief in laying down and not moving.   Severe pain 5 Intense and extremely unpleasant. Associated with frowning face and frequent crying. Pain overwhelms the senses.  Ability to do any activity or maintain social relationships becomes significantly limited. Conversation becomes difficult. Pacing back and forth is common, as getting into a comfortable position is nearly impossible. Pain wakes you up from deep sleep. Physical signs will be obvious: pupillary dilation; increased sweating; goosebumps; brisk reflexes; cold, clammy hands and feet; nausea, vomiting or dry heaves; loss of appetite; significant sleep disturbance with inability to fall asleep or to remain asleep. When persistent, significant weight loss is observed due to the complete loss of appetite and sleep deprivation.  Blood pressure and heart rate becomes significantly elevated. Caution: If elevated blood pressure triggers a pounding headache associated with blurred vision, then the patient should immediately seek attention at an urgent or emergency care unit, as these may be signs of an impending stroke.    Emergency Department Pain Levels (6-10/10)  Emergency Room Pain 6 Severely limiting. Requires emergency care and should not be seen or managed at an outpatient pain management facility. Communication becomes difficult and requires great effort. Assistance to reach the emergency department may be required. Facial flushing and profuse sweating along with potentially dangerous increases in heart rate and blood pressure will be evident.   Distressing pain 7 Self-care is very difficult. Assistance  is required to transport, or use restroom. Assistance to reach the emergency department will be required. Tasks requiring coordination, such as bathing and getting dressed become very difficult.   Disabling pain 8 Self-care is no longer possible. At this level, pain is disabling. The individual is  unable to do even the most "basic" activities such as walking, eating, bathing, dressing, transferring to a bed, or toileting. Fine motor skills are lost. It is difficult to think clearly.   Incapacitating pain 9 Pain becomes incapacitating. Thought processing is no longer possible. Difficult to remember your own name. Control of movement and coordination are lost.   The worst pain imaginable 10 At this level, most patients pass out from pain. When this level is reached, collapse of the autonomic nervous system occurs, leading to a sudden drop in blood pressure and heart rate. This in turn results in a temporary and dramatic drop in blood flow to the brain, leading to a loss of consciousness. Fainting is one of the body's self defense mechanisms. Passing out puts the brain in a calmed state and causes it to shut down for a while, in order to begin the healing process.    Summary: 1. Refer to this scale when providing [place] with your pain level. 2. Be accurate and careful when reporting your pain level. This will help with your care. 3. Over-reporting your pain level will lead to loss of credibility. 4. Even a level of 1/10 means that there is pain and will be treated at our facility. 5. High, inaccurate reporting will be documented as "Symptom Exaggeration", leading to loss of credibility and suspicions of possible secondary gains such as obtaining more narcotics, or wanting to appear disabled, for fraudulent reasons. 6. Only pain levels of 5 or below will be seen at our facility. 7. Pain levels of 6 and above will be sent to the Emergency Department and the appointment cancelled. ____________________________________________________________________________________________  Facet Blocks Patient Information  Description: The facets are joints in the spine between the vertebrae.  Like any joints in the body, facets can become irritated and painful.  Arthritis can also effect the facets.  By injecting  steroids and local anesthetic in and around these joints, we can temporarily block the nerve supply to them.  Steroids act directly on irritated nerves and tissues to reduce selling and inflammation which often leads to decreased pain.  Facet blocks may be done anywhere along the spine from the neck to the low back depending upon the location of your pain.   After numbing the skin with local anesthetic (like Novocaine), a small needle is passed onto the facet joints under x-ray guidance.  You may experience a sensation of pressure while this is being done.  The entire block usually lasts about 15-25 minutes.   Conditions which may be treated by facet blocks:   Low back/buttock pain  Neck/shoulder pain  Certain types of headaches  Preparation for the injection:  1. Do not eat any solid food or dairy products within 8 hours of your appointment. 2. You may drink clear liquid up to 3 hours before appointment.  Clear liquids include water, black coffee, juice or soda.  No milk or cream please. 3. You may take your regular medication, including pain medications, with a sip of water before your appointment.  Diabetics should hold regular insulin (if taken separately) and take 1/2 normal NPH dose the morning of the procedure.  Carry some sugar containing items with you to your appointment. 4.  A driver must accompany you and be prepared to drive you home after your procedure. 5. Bring all your current medications with you. 6. An IV may be inserted and sedation may be given at the discretion of the physician. 7. A blood pressure cuff, EKG and other monitors will often be applied during the procedure.  Some patients may need to have extra oxygen administered for a short period. 8. You will be asked to provide medical information, including your allergies and medications, prior to the procedure.  We must know immediately if you are taking blood thinners (like Coumadin/Warfarin) or if you are allergic to IV  iodine contrast (dye).  We must know if you could possible be pregnant.  Possible side-effects:   Bleeding from needle site  Infection (rare, may require surgery)  Nerve injury (rare)  Numbness & tingling (temporary)  Difficulty urinating (rare, temporary)  Spinal headache (a headache worse with upright posture)  Light-headedness (temporary)  Pain at injection site (serveral days)  Decreased blood pressure (rare, temporary)  Weakness in arm/leg (temporary)  Pressure sensation in back/neck (temporary)   Call if you experience:   Fever/chills associated with headache or increased back/neck pain  Headache worsened by an upright position  New onset, weakness or numbness of an extremity below the injection site  Hives or difficulty breathing (go to the emergency room)  Inflammation or drainage at the injection site(s)  Severe back/neck pain greater than usual  New symptoms which are concerning to you  Please note:  Although the local anesthetic injected can often make your back or neck feel good for several hours after the injection, the pain will likely return. It takes 3-7 days for steroids to work.  You may not notice any pain relief for at least one week.  If effective, we will often do a series of 2-3 injections spaced 3-6 weeks apart to maximally decrease your pain.  After the initial series, you may be a candidate for a more permanent nerve block of the facets.  If you have any questions, please call #[phone]) [place] not eat or drink 3 hours before your appointment.  BMI Assessment: Estimated body mass index is 25.32 kg/m as calculated from the following:   Height as of this encounter: _0  (1.549 m).   Weight as of this encounter: 134 lb (60.8 kg).  BMI interpretation table: BMI level Category Range association with higher incidence of chronic pain  <18 kg/m2 Underweight   18.5-24.9 kg/m2 Ideal body  weight   25-29.9 kg/m2 Overweight Increased incidence by 20%  30-34.9 kg/m2 Obese (Class I) Increased incidence by 68%  35-39.9 kg/m2 Severe obesity (Class II) Increased incidence by 136%  >40 kg/m2 Extreme obesity (Class III) Increased incidence by 254%   BMI Readings from Last 4 Encounters:  01/04/17 25.32 kg/m  10/18/16 24.61 kg/m  09/20/16 25.37 kg/m  08/09/16 24.66 kg/m   Wt Readings from Last 4 Encounters:  01/04/17 134 lb (60.8 kg)  10/18/16 130 lb 4 oz (59.1 kg)  09/20/16 134 lb 4 oz (60.9 kg)  08/09/16 130 lb 8 oz (59.2 kg)

## 2017-01-04 NOTE — Progress Notes (Signed)
Safety precautions to be maintained throughout the outpatient stay will include: orient to surroundings, keep bed in low position, maintain call bell within reach at all times, provide assistance with transfer out of bed and ambulation.  

## 2017-01-08 DIAGNOSIS — L74512 Primary focal hyperhidrosis, palms: Secondary | ICD-10-CM | POA: Diagnosis not present

## 2017-01-08 DIAGNOSIS — R61 Generalized hyperhidrosis: Secondary | ICD-10-CM | POA: Diagnosis not present

## 2017-01-08 DIAGNOSIS — R208 Other disturbances of skin sensation: Secondary | ICD-10-CM | POA: Diagnosis not present

## 2017-01-08 DIAGNOSIS — L74519 Primary focal hyperhidrosis, unspecified: Secondary | ICD-10-CM | POA: Diagnosis not present

## 2017-01-08 DIAGNOSIS — L74513 Primary focal hyperhidrosis, soles: Secondary | ICD-10-CM | POA: Diagnosis not present

## 2017-01-09 NOTE — Progress Notes (Signed)
Results were reviewed and found to be: mildly abnormal  No acute injury or pathology identified  Review would suggest interventional pain management techniques may be of benefit 

## 2017-01-18 ENCOUNTER — Ambulatory Visit
Admission: RE | Admit: 2017-01-18 | Discharge: 2017-01-18 | Disposition: A | Discharge status: Home / Self Care | Payer: Medicare HMO | Source: Ambulatory Visit | Attending: Family Medicine | Admitting: Family Medicine

## 2017-01-18 DIAGNOSIS — Z1231 Encounter for screening mammogram for malignant neoplasm of breast: Secondary | ICD-10-CM | POA: Diagnosis not present

## 2017-01-24 ENCOUNTER — Ambulatory Visit: Payer: Medicare HMO | Admitting: Internal Medicine

## 2017-02-03 DIAGNOSIS — R69 Illness, unspecified: Secondary | ICD-10-CM | POA: Diagnosis not present
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
## 2017-02-06 ENCOUNTER — Ambulatory Visit (INDEPENDENT_AMBULATORY_CARE_PROVIDER_SITE_OTHER): Payer: Medicare HMO | Admitting: Internal Medicine

## 2017-02-06 ENCOUNTER — Encounter: Payer: Self-pay | Admitting: Internal Medicine

## 2017-02-06 VITALS — BP 98/78 | HR 114 | Ht 61.0 in | Wt 136.2 lb

## 2017-02-06 DIAGNOSIS — R42 Dizziness and giddiness: Secondary | ICD-10-CM

## 2017-02-06 DIAGNOSIS — Z87898 Personal history of other specified conditions: Secondary | ICD-10-CM | POA: Diagnosis not present

## 2017-02-06 DIAGNOSIS — E876 Hypokalemia: Secondary | ICD-10-CM

## 2017-02-06 NOTE — Patient Instructions (Signed)
Medication Instructions:  Your physician recommends that you continue on your current medications as directed. Please refer to the Current Medication list given to you today.   Labwork: Your physician recommends that you return for lab work in: TODAY (CBC, BMP, MG).   Testing/Procedures: none  Follow-Up: Your physician recommends that you schedule a follow-up appointment in: 2 MONTHS WITH DR END (EARLY NOV).   Any Other Special Instructions Will Be Listed Below (If Applicable).  - Dr. Saunders Revel recommends you wear compression stockings, tight clothing and drink plenty of fluids.    If you need a refill on your cardiac medications before your next appointment, please call your pharmacy.

## 2017-02-07 LAB — CBC WITH DIFFERENTIAL/PLATELET
BASOS ABS: 0 10*3/uL (ref 0.0–0.2)
Basos: 0 %
EOS (ABSOLUTE): 0.1 10*3/uL (ref 0.0–0.4)
Eos: 1 %
HEMATOCRIT: 38.4 % (ref 34.0–46.6)
HEMOGLOBIN: 12.9 g/dL (ref 11.1–15.9)
Immature Grans (Abs): 0 10*3/uL (ref 0.0–0.1)
Immature Granulocytes: 0 %
LYMPHS ABS: 1.7 10*3/uL (ref 0.7–3.1)
Lymphs: 31 %
MCH: 32.6 pg (ref 26.6–33.0)
MCHC: 33.6 g/dL (ref 31.5–35.7)
MCV: 97 fL (ref 79–97)
MONOCYTES: 8 %
MONOS ABS: 0.5 10*3/uL (ref 0.1–0.9)
NEUTROS PCT: 60 %
Neutrophils Absolute: 3.3 10*3/uL (ref 1.4–7.0)
PLATELETS: 232 10*3/uL (ref 150–379)
RBC: 3.96 x10E6/uL (ref 3.77–5.28)
RDW: 12.3 % (ref 12.3–15.4)
WBC: 5.6 10*3/uL (ref 3.4–10.8)

## 2017-02-07 LAB — BASIC METABOLIC PANEL
BUN/Creatinine Ratio: 10 — ABNORMAL LOW (ref 12–28)
BUN: 6 mg/dL — ABNORMAL LOW (ref 8–27)
CALCIUM: 9.3 mg/dL (ref 8.7–10.3)
CO2: 22 mmol/L (ref 20–29)
CREATININE: 0.6 mg/dL (ref 0.57–1.00)
Chloride: 105 mmol/L (ref 96–106)
GFR calc Af Amer: 114 mL/min/{1.73_m2} (ref >59)
GFR calc non Af Amer: 99 mL/min/{1.73_m2} (ref >59)
GLUCOSE: 104 mg/dL — AB (ref 65–99)
POTASSIUM: 4.2 mmol/L (ref 3.5–5.2)
SODIUM: 143 mmol/L (ref 134–144)

## 2017-02-07 LAB — MAGNESIUM: MAGNESIUM: 2.2 mg/dL (ref 1.6–2.3)

## 2017-02-07 NOTE — Addendum Note (Signed)
Addended by: Anselm Pancoast on: 02/07/2017 02:16 PM   Modules accepted: Orders

## 2017-02-12 ENCOUNTER — Encounter: Payer: Self-pay | Admitting: Pain Medicine

## 2017-02-12 ENCOUNTER — Ambulatory Visit: Payer: Medicare HMO | Attending: Pain Medicine | Admitting: Pain Medicine

## 2017-02-12 VITALS — BP 120/84 | HR 119 | Temp 98.2°F | Resp 16 | Ht 61.0 in | Wt 136.0 lb

## 2017-02-12 DIAGNOSIS — Z809 Family history of malignant neoplasm, unspecified: Secondary | ICD-10-CM | POA: Insufficient documentation

## 2017-02-12 DIAGNOSIS — K219 Gastro-esophageal reflux disease without esophagitis: Secondary | ICD-10-CM | POA: Diagnosis not present

## 2017-02-12 DIAGNOSIS — M899 Disorder of bone, unspecified: Secondary | ICD-10-CM

## 2017-02-12 DIAGNOSIS — Z87442 Personal history of urinary calculi: Secondary | ICD-10-CM | POA: Diagnosis not present

## 2017-02-12 DIAGNOSIS — G8929 Other chronic pain: Secondary | ICD-10-CM | POA: Diagnosis not present

## 2017-02-12 DIAGNOSIS — M454 Ankylosing spondylitis of thoracic region: Secondary | ICD-10-CM | POA: Diagnosis not present

## 2017-02-12 DIAGNOSIS — Z811 Family history of alcohol abuse and dependence: Secondary | ICD-10-CM | POA: Insufficient documentation

## 2017-02-12 DIAGNOSIS — F419 Anxiety disorder, unspecified: Secondary | ICD-10-CM | POA: Insufficient documentation

## 2017-02-12 DIAGNOSIS — Z836 Family history of other diseases of the respiratory system: Secondary | ICD-10-CM | POA: Insufficient documentation

## 2017-02-12 DIAGNOSIS — Z803 Family history of malignant neoplasm of breast: Secondary | ICD-10-CM | POA: Insufficient documentation

## 2017-02-12 DIAGNOSIS — M4696 Unspecified inflammatory spondylopathy, lumbar region: Secondary | ICD-10-CM | POA: Diagnosis not present

## 2017-02-12 DIAGNOSIS — Z87891 Personal history of nicotine dependence: Secondary | ICD-10-CM | POA: Insufficient documentation

## 2017-02-12 DIAGNOSIS — E785 Hyperlipidemia, unspecified: Secondary | ICD-10-CM | POA: Insufficient documentation

## 2017-02-12 DIAGNOSIS — Z7982 Long term (current) use of aspirin: Secondary | ICD-10-CM | POA: Insufficient documentation

## 2017-02-12 DIAGNOSIS — G894 Chronic pain syndrome: Secondary | ICD-10-CM | POA: Diagnosis not present

## 2017-02-12 DIAGNOSIS — J309 Allergic rhinitis, unspecified: Secondary | ICD-10-CM | POA: Insufficient documentation

## 2017-02-12 DIAGNOSIS — Z9889 Other specified postprocedural states: Secondary | ICD-10-CM | POA: Insufficient documentation

## 2017-02-12 DIAGNOSIS — R7303 Prediabetes: Secondary | ICD-10-CM | POA: Diagnosis not present

## 2017-02-12 DIAGNOSIS — Z885 Allergy status to narcotic agent status: Secondary | ICD-10-CM | POA: Diagnosis not present

## 2017-02-12 DIAGNOSIS — Z8619 Personal history of other infectious and parasitic diseases: Secondary | ICD-10-CM | POA: Insufficient documentation

## 2017-02-12 DIAGNOSIS — M160 Bilateral primary osteoarthritis of hip: Secondary | ICD-10-CM | POA: Diagnosis not present

## 2017-02-12 DIAGNOSIS — M545 Low back pain, unspecified: Secondary | ICD-10-CM

## 2017-02-12 DIAGNOSIS — N3946 Mixed incontinence: Secondary | ICD-10-CM | POA: Diagnosis not present

## 2017-02-12 DIAGNOSIS — F329 Major depressive disorder, single episode, unspecified: Secondary | ICD-10-CM | POA: Insufficient documentation

## 2017-02-12 DIAGNOSIS — M79604 Pain in right leg: Secondary | ICD-10-CM | POA: Diagnosis not present

## 2017-02-12 DIAGNOSIS — Z888 Allergy status to other drugs, medicaments and biological substances status: Secondary | ICD-10-CM | POA: Insufficient documentation

## 2017-02-12 DIAGNOSIS — E876 Hypokalemia: Secondary | ICD-10-CM | POA: Insufficient documentation

## 2017-02-12 DIAGNOSIS — I471 Supraventricular tachycardia: Secondary | ICD-10-CM | POA: Diagnosis not present

## 2017-02-12 DIAGNOSIS — Z881 Allergy status to other antibiotic agents status: Secondary | ICD-10-CM | POA: Diagnosis not present

## 2017-02-12 DIAGNOSIS — M47816 Spondylosis without myelopathy or radiculopathy, lumbar region: Secondary | ICD-10-CM | POA: Diagnosis not present

## 2017-02-12 DIAGNOSIS — Z90710 Acquired absence of both cervix and uterus: Secondary | ICD-10-CM | POA: Diagnosis not present

## 2017-02-12 DIAGNOSIS — Z789 Other specified health status: Secondary | ICD-10-CM

## 2017-02-12 DIAGNOSIS — Z818 Family history of other mental and behavioral disorders: Secondary | ICD-10-CM | POA: Insufficient documentation

## 2017-02-12 DIAGNOSIS — S32010S Wedge compression fracture of first lumbar vertebra, sequela: Secondary | ICD-10-CM | POA: Insufficient documentation

## 2017-02-12 DIAGNOSIS — Z79899 Other long term (current) drug therapy: Secondary | ICD-10-CM | POA: Diagnosis not present

## 2017-02-12 DIAGNOSIS — Z833 Family history of diabetes mellitus: Secondary | ICD-10-CM | POA: Insufficient documentation

## 2017-02-12 NOTE — Patient Instructions (Addendum)
____________________________________________________________________________________________  Preparing for your procedure (without sedation) Instructions: . Oral Intake: Do not eat or drink anything for at least 3 hours prior to your procedure. . Transportation: Unless otherwise stated by your physician, you may drive yourself after the procedure. . Blood Pressure Medicine: Take your blood pressure medicine with a sip of water the morning of the procedure. . Blood thinners:  . Diabetics on insulin: Notify the staff so that you can be scheduled 1st case in the morning. If your diabetes requires high dose insulin, take only  of your normal insulin dose the morning of the procedure and notify the staff that you have done so. . Preventing infections: Shower with an antibacterial soap the morning of your procedure.  . Build-up your immune system: Take 1000 mg of Vitamin C with every meal (3 times a day) the day prior to your procedure. Marland Kitchen Antibiotics: Inform the staff if you have a condition or reason that requires you to take antibiotics before dental procedures. . Pregnancy: If you are pregnant, call and cancel the procedure. . Sickness: If you have a cold, fever, or any active infections, call and cancel the procedure. . Arrival: You must be in the facility at least 30 minutes prior to your scheduled procedure. . Children: Do not bring any children with you. . Dress appropriately: Bring dark clothing that you would not mind if they get stained. . Valuables: Do not bring any jewelry or valuables. Procedure appointments are reserved for interventional treatments only. Marland Kitchen No Prescription Refills. . No medication changes will be discussed during procedure appointments. . No disability issues will be discussed. ____________________________________________________________________________________________  Facet Blocks Patient Information  Description: The facets are joints in the spine between the  vertebrae.  Like any joints in the body, facets can become irritated and painful.  Arthritis can also effect the facets.  By injecting steroids and local anesthetic in and around these joints, we can temporarily block the nerve supply to them.  Steroids act directly on irritated nerves and tissues to reduce selling and inflammation which often leads to decreased pain.  Facet blocks may be done anywhere along the spine from the neck to the low back depending upon the location of your pain.   After numbing the skin with local anesthetic (like Novocaine), a small needle is passed onto the facet joints under x-ray guidance.  You may experience a sensation of pressure while this is being done.  The entire block usually lasts about 15-25 minutes.   Conditions which may be treated by facet blocks:   Low back/buttock pain  Neck/shoulder pain  Certain types of headaches  Preparation for the injection:  1. Do not eat any solid food or dairy products within 8 hours of your appointment. 2. You may drink clear liquid up to 3 hours before appointment.  Clear liquids include water, black coffee, juice or soda.  No milk or cream please. 3. You may take your regular medication, including pain medications, with a sip of water before your appointment.  Diabetics should hold regular insulin (if taken separately) and take 1/2 normal NPH dose the morning of the procedure.  Carry some sugar containing items with you to your appointment. 4. A driver must accompany you and be prepared to drive you home after your procedure. 5. Bring all your current medications with you. 6. An IV may be inserted and sedation may be given at the discretion of the physician. 7. A blood pressure cuff, EKG and other monitors will  often be applied during the procedure.  Some patients may need to have extra oxygen administered for a short period. 8. You will be asked to provide medical information, including your allergies and medications,  prior to the procedure.  We must know immediately if you are taking blood thinners (like Coumadin/Warfarin) or if you are allergic to IV iodine contrast (dye).  We must know if you could possible be pregnant.  Possible side-effects:   Bleeding from needle site  Infection (rare, may require surgery)  Nerve injury (rare)  Numbness & tingling (temporary)  Difficulty urinating (rare, temporary)  Spinal headache (a headache worse with upright posture)  Light-headedness (temporary)  Pain at injection site (serveral days)  Decreased blood pressure (rare, temporary)  Weakness in arm/leg (temporary)  Pressure sensation in back/neck (temporary)   Call if you experience:   Fever/chills associated with headache or increased back/neck pain  Headache worsened by an upright position  New onset, weakness or numbness of an extremity below the injection site  Hives or difficulty breathing (go to the emergency room)  Inflammation or drainage at the injection site(s)  Severe back/neck pain greater than usual  New symptoms which are concerning to you  Please note:  Although the local anesthetic injected can often make your back or neck feel good for several hours after the injection, the pain will likely return. It takes 3-7 days for steroids to work.  You may not notice any pain relief for at least one week.  If effective, we will often do a series of 2-3 injections spaced 3-6 weeks apart to maximally decrease your pain.  After the initial series, you may be a candidate for a more permanent nerve block of the facets.  If you have any questions, please call #[phone] [place]  What are the risk, side effects and possible complications? Generally speaking, most procedures are safe.  However, with any procedure there are risks, side effects, and the possibility of complications.  The risks and complications are  dependent upon the sites that are lesioned, or the type of nerve block to be performed.  The closer the procedure is to the spine, the more serious the risks are.  Great care is taken when placing the radio frequency needles, block needles or lesioning probes, but sometimes complications can occur. 1. Infection: Any time there is an injection through the skin, there is a risk of infection.  This is why sterile conditions are used for these blocks.  There are four possible types of infection. 1. Localized skin infection. 2. Central Nervous System Infection-This can be in the form of Meningitis, which can be deadly. 3. Epidural Infections-This can be in the form of an epidural abscess, which can cause pressure inside of the spine, causing compression of the spinal cord with subsequent paralysis. This would require an emergency surgery to decompress, and there are no guarantees that the patient would recover from the paralysis. 4. Discitis-This is an infection of the intervertebral discs.  It occurs in about 1% of discography procedures.  It is difficult to treat and it may lead to surgery.        2. Pain: the needles have to go through skin and soft tissues, will cause soreness.       3. Damage to internal structures:  The nerves to be lesioned may be near blood vessels or    other nerves which can be potentially damaged.  4. Bleeding: Bleeding is more common if the patient is taking blood thinners such as  aspirin, Coumadin, Ticiid, Plavix, etc., or if he/she have some genetic predisposition  such as hemophilia. Bleeding into the spinal canal can cause compression of the spinal  cord with subsequent paralysis.  This would require an emergency surgery to  decompress and there are no guarantees that the patient would recover from the  paralysis.       5. Pneumothorax:  Puncturing of a lung is a possibility, every time a needle is introduced in  the area of the chest or upper back.  Pneumothorax refers  to free air around the  collapsed lung(s), inside of the thoracic cavity (chest cavity).  Another two possible  complications related to a similar event would include: Hemothorax and Chylothorax.   These are variations of the Pneumothorax, where instead of air around the collapsed  lung(s), you may have blood or chyle, respectively.       6. Spinal headaches: They may occur with any procedures in the area of the spine.       7. Persistent CSF (Cerebro-Spinal Fluid) leakage: This is a rare problem, but may occur  with prolonged intrathecal or epidural catheters either due to the formation of a fistulous  track or a dural tear.       8. Nerve damage: By working so close to the spinal cord, there is always a possibility of  nerve damage, which could be as serious as a permanent spinal cord injury with  paralysis.       9. Death:  Although rare, severe deadly allergic reactions known as "Anaphylactic  reaction" can occur to any of the medications used.      10. Worsening of the symptoms:  We can always make thing worse.  What are the chances of something like this happening? Chances of any of this occuring are extremely low.  By statistics, you have more of a chance of getting killed in a motor vehicle accident: while driving to the hospital than any of the above occurring .  Nevertheless, you should be aware that they are possibilities.  In general, it is similar to taking a shower.  Everybody knows that you can slip, hit your head and get killed.  Does that mean that you should not shower again?  Nevertheless always keep in mind that statistics do not mean anything if you happen to be on the wrong side of them.  Even if a procedure has a 1 (one) in a 1,000,000 (million) chance of going wrong, it you happen to be that one..Also, keep in mind that by statistics, you have more of a chance of having something go wrong when taking medications.  Who should not have this procedure? If you are on a blood thinning  medication (e.g. Coumadin, Plavix, see list of "Blood Thinners"), or if you have an active infection going on, you should not have the procedure.  If you are taking any blood thinners, please inform your physician.  How should I prepare for this procedure?  Do not eat or drink anything at least six hours prior to the procedure.  Bring a driver with you .  It cannot be a taxi.  Come accompanied by an adult that can drive you back, and that is strong enough to help you if your legs get weak or numb from the local anesthetic.  Take all of your medicines the morning of the procedure with just enough water  to swallow them.  If you have diabetes, make sure that you are scheduled to have your procedure done first thing in the morning, whenever possible.  If you have diabetes, take only half of your insulin dose and notify our nurse that you have done so as soon as you arrive at the clinic.  If you are diabetic, but only take blood sugar pills (oral hypoglycemic), then do not take them on the morning of your procedure.  You may take them after you have had the procedure.  Do not take aspirin or any aspirin-containing medications, at least eleven (11) days prior to the procedure.  They may prolong bleeding.  Wear loose fitting clothing that may be easy to take off and that you would not mind if it got stained with Betadine or blood.  Do not wear any jewelry or perfume  Remove any nail coloring.  It will interfere with some of our monitoring equipment.  NOTE: Remember that this is not meant to be interpreted as a complete list of all possible complications.  Unforeseen problems may occur.  BLOOD THINNERS The following drugs contain aspirin or other products, which can cause increased bleeding during surgery and should not be taken for 2 weeks prior to and 1 week after surgery.  If you should need take something for relief of minor pain, you may take acetaminophen which is found in Tylenol,m  Datril, Anacin-3 and Panadol. It is not blood thinner. The products listed below are.  Do not take any of the products listed below in addition to any listed on your instruction sheet.  A.P.C or A.P.C with Codeine Codeine Phosphate Capsules #3 Ibuprofen Ridaura  ABC compound Congesprin Imuran rimadil  Advil Cope Indocin Robaxisal  Alka-Seltzer Effervescent Pain Reliever and Antacid Coricidin or Coricidin-D  Indomethacin Rufen  Alka-Seltzer plus Cold Medicine Cosprin Ketoprofen S-A-C Tablets  Anacin Analgesic Tablets or Capsules Coumadin Korlgesic Salflex  Anacin Extra Strength Analgesic tablets or capsules CP-2 Tablets Lanoril Salicylate  Anaprox Cuprimine Capsules Levenox Salocol  Anexsia-D Dalteparin Magan Salsalate  Anodynos Darvon compound Magnesium Salicylate Sine-off  Ansaid Dasin Capsules Magsal Sodium Salicylate  Anturane Depen Capsules Marnal Soma  APF Arthritis pain formula Dewitt's Pills Measurin Stanback  Argesic Dia-Gesic Meclofenamic Sulfinpyrazone  Arthritis Bayer Timed Release Aspirin Diclofenac Meclomen Sulindac  Arthritis pain formula Anacin Dicumarol Medipren Supac  Analgesic (Safety coated) Arthralgen Diffunasal Mefanamic Suprofen  Arthritis Strength Bufferin Dihydrocodeine Mepro Compound Suprol  Arthropan liquid Dopirydamole Methcarbomol with Aspirin Synalgos  ASA tablets/Enseals Disalcid Micrainin Tagament  Ascriptin Doan's Midol Talwin  Ascriptin A/D Dolene Mobidin Tanderil  Ascriptin Extra Strength Dolobid Moblgesic Ticlid  Ascriptin with Codeine Doloprin or Doloprin with Codeine Momentum Tolectin  Asperbuf Duoprin Mono-gesic Trendar  Aspergum Duradyne Motrin or Motrin IB Triminicin  Aspirin plain, buffered or enteric coated Durasal Myochrisine Trigesic  Aspirin Suppositories Easprin Nalfon Trillsate  Aspirin with Codeine Ecotrin Regular or Extra Strength Naprosyn Uracel  Atromid-S Efficin Naproxen Ursinus  Auranofin Capsules Elmiron Neocylate Vanquish   Axotal Emagrin Norgesic Verin  Azathioprine Empirin or Empirin with Codeine Normiflo Vitamin E  Azolid Emprazil Nuprin Voltaren  Bayer Aspirin plain, buffered or children's or timed BC Tablets or powders Encaprin Orgaran Warfarin Sodium  Buff-a-Comp Enoxaparin Orudis Zorpin  Buff-a-Comp with Codeine Equegesic Os-Cal-Gesic   Buffaprin Excedrin plain, buffered or Extra Strength Oxalid   Bufferin Arthritis Strength Feldene Oxphenbutazone   Bufferin plain or Extra Strength Feldene Capsules Oxycodone with Aspirin   Bufferin with Codeine Fenoprofen Fenoprofen Pabalate or Pabalate-SF  Buffets II Flogesic Panagesic   Buffinol plain or Extra Strength Florinal or Florinal with Codeine Panwarfarin   Buf-Tabs Flurbiprofen Penicillamine   Butalbital Compound Four-way cold tablets Penicillin   Butazolidin Fragmin Pepto-Bismol   Carbenicillin Geminisyn Percodan   Carna Arthritis Reliever Geopen Persantine   Carprofen Gold's salt Persistin   Chloramphenicol Goody's Phenylbutazone   Chloromycetin Haltrain Piroxlcam   Clmetidine heparin Plaquenil   Cllnoril Hyco-pap Ponstel   Clofibrate Hydroxy chloroquine Propoxyphen         Before stopping any of these medications, be sure to consult the physician who ordered them.  Some, such as Coumadin (Warfarin) are ordered to prevent or treat serious conditions such as "deep thrombosis", "pumonary embolisms", and other heart problems.  The amount of time that you may need off of the medication may also vary with the medication and the reason for which you were taking it.  If you are taking any of these medications, please make sure you notify your pain physician before you undergo any procedures.

## 2017-02-12 NOTE — Progress Notes (Signed)
Patient's Name: Wanda [name]  MRN: [id number]  Referring Provider: Herminio Commons, MD  DOB: 1955/12/29  PCP: Herminio Commons, MD  DOS: 02/12/2017  Note by: Gaspar Cola, MD  Service setting: Ambulatory outpatient  Specialty: Interventional Pain Management  Location: [facility] (AMB) Pain Management Facility    Patient type: Established   Primary Reason(s) for Visit: Evaluation of chronic illnesses with exacerbation, or progression (Level of risk: moderate) CC: Hip Pain (left)  HPI  Wanda [name] is a 61 y.o. year old, female patient, who comes today for a follow-up evaluation. She has Hyperlipidemia; Hypokalemia; Anxiety and depression; Common migraine; SVT (supraventricular tachycardia) ([place]); Allergic rhinitis; Gastroesophageal reflux disease; Chronic constipation; Generalized osteoarthrosis of hand; Osteoporosis; Osteopenia; Mixed urinary incontinence; History of nephrolithiasis; Chronic prescription benzodiazepine use; Chronic low back pain (Primary Source of Pain) (Bilateral) (R>L); Spinal cord stimulator status (battery on left buttocks); Lumbar spondylosis; Chronic lower extremity pain (Left); Lumbar facet arthropathy ([place]); Lumbar facet syndrome (Bilateral) (R>L); Chronic lower extremity pain (Secondary source of pain) (Right); Chronic hip pain (Right); Osteoarthritis of hip (Right); Chronic neck pain; Closed fracture of hip ([place]); Orthostatic lightheadedness; Atypical chest pain; Chronic pain syndrome; Closed intertrochanteric fracture ([place]); History of chest pain; Compression fracture of L1 lumbar vertebra (old); Pharmacologic therapy; Polypharmacy; Disorder of skeletal system; and Problems influencing health status on her problem list. Wanda [name] was last seen on Visit date not found. Her primarily concern today is the Hip Pain (left)  Pain Assessment: Location: Left Hip Radiating: down the left leg Onset: More than a month ago Duration: Chronic pain Quality: Sharp, Burning,  Constant Severity: 3 /10 (self-reported pain score)  Note: Reported level is compatible with observation.                   Effect on ADL: sleep disturbance causing her to feel tired during the day which makes getting things done difficult  Timing: Constant (varying in intensity) Modifying factors: patient has SCS placed by Dr Dossie Arbour by 2014.  positioning with pillows  Further details on both, my assessment(s), as well as the proposed treatment plan, please see below.  Post-Procedure Assessment  Visit date not found Procedure: Diagnostic bilateral lumbar facet block under fluoroscopic guidance and IV sedation #2 Pre-procedure pain score:  4/10 Post-procedure pain score: 0/10 (100% relief) Influential Factors: BMI: 25.70 kg/m Intra-procedural challenges: None observed.         Assessment challenges: None detected.              Reported side-effects: None.        Post-procedural adverse reactions or complications: None reported         Sedation: Sedation provided. When no sedatives are used, the analgesic levels obtained are directly associated to the effectiveness of the local anesthetics. However, when sedation is provided, the level of analgesia obtained during the initial 1 hour following the intervention, is believed to be the result of a combination of factors. These factors may include, but are not limited to: 1. The effectiveness of the local anesthetics used. 2. The effects of the analgesic(s) and/or anxiolytic(s) used. 3. The degree of discomfort experienced by the patient at the time of the procedure. 4. The patients ability and reliability in recalling and recording the events. 5. The presence and influence of possible secondary gains and/or psychosocial factors. Reported result: Relief experienced during the 1st hour after the procedure:  100% (Ultra-Short Term Relief)  Interpretative annotation: Clinically appropriate result. Analgesia during this period is likely  to be Local Anesthetic and/or IV Sedative (Analgesic/Anxiolytic) related.          Effects of local anesthetic: The analgesic effects attained during this period are directly associated to the localized infiltration of local anesthetics and therefore cary significant diagnostic value as to the etiological location, or anatomical origin, of the pain. Expected duration of relief is directly dependent on the pharmacodynamics of the local anesthetic used. Long-acting (4-6 hours) anesthetics used.  Reported result: Relief during the next 4 to 6 hour after the procedure:  100% (Short-Term Relief)            Interpretative annotation: Clinically appropriate result. Analgesia during this period is likely to be Local Anesthetic-related.          Long-term benefit: Defined as the period of time past the expected duration of local anesthetics (1 hour for short-acting and 4-6 hours for long-acting). With the possible exception of prolonged sympathetic blockade from the local anesthetics, benefits during this period are typically attributed to, or associated with, other factors such as analgesic sensory neuropraxia, antiinflammatory effects, or beneficial biochemical changes provided by agents other than the local anesthetics.  Reported result: Extended relief following procedure:  85% (Long-Term Relief)            Interpretative annotation: Clinically appropriate result. Good relief. No permanent benefit expected. Inflammation plays a part in the etiology to the pain.          Current benefits: Defined as persistent relief that continues at this point in time.   Reported results: Treated area: <50 % Wanda [name] reports improvement in function. However, the pain on the right side is completely gone and has not returned. (100% on the right side). Interpretative annotation: Recurrence of symptoms. No permanent benefit expected. Effective therapeutic approach.          Interpretation: Results would suggest a  successful diagnostic intervention.                  Plan:  Repeat procedure on the left side.  Laboratory Chemistry  Inflammation Markers (CRP: Acute Phase) (ESR: Chronic Phase) No results found for: CRP, ESRSEDRATE               Renal Function Markers Lab Results  Component Value Date   BUN 6 (L) 02/06/2017   CREATININE 0.60 02/06/2017   GFRAA 114 02/06/2017   GFRNONAA 99 02/06/2017                 Hepatic Function Markers Lab Results  Component Value Date   AST 36 08/09/2016   ALT 29 08/09/2016   ALBUMIN 4.6 08/09/2016   ALKPHOS 84 08/09/2016                 Electrolytes Lab Results  Component Value Date   NA 143 02/06/2017   K 4.2 02/06/2017   CL 105 02/06/2017   CALCIUM 9.3 02/06/2017   MG 2.2 02/06/2017                 Neuropathy Markers No results found for: [id number]               Bone Pathology Markers Lab Results  Component Value Date   ALKPHOS 84 08/09/2016   CALCIUM 9.3 02/06/2017                 Coagulation Parameters Lab Results  Component Value Date   PLT  232 02/06/2017                 Cardiovascular Markers Lab Results  Component Value Date   HGB 12.9 02/06/2017   HCT 38.4 02/06/2017                 Note: Lab results reviewed.  Recent Diagnostic Imaging Review  Cervical Imaging: Cervical DG complete:  Results for orders placed during the hospital encounter of 01/04/17  DG Cervical Spine Complete   Narrative CLINICAL DATA:  Chronic neck pain  EXAM: CERVICAL SPINE - COMPLETE 4+ VIEW  COMPARISON:  None.  FINDINGS: Seven cervical segments are well visualized. Vertebral body height is well maintained. Osteophytic changes are noted from C4-C7. Disc space narrowing at C5-6 and C6-7 is noted. Mild neural foraminal narrowing is seen at C5-6 and C6-7. No soft tissue abnormality is seen.  IMPRESSION: Multilevel degenerative change without acute abnormality.   Electronically Signed   By: Inez Catalina M.D.   On: 01/04/2017  14:16    Lumbosacral Imaging: Lumbar MR w/wo contrast:  Results for orders placed during the hospital encounter of 08/07/12  MR Lumbar Spine W Wo Contrast   Narrative *RADIOLOGY REPORT*  Clinical Data: Increasing low back pain with bilateral hip and leg pain, weakness, and numbness. Increasing hypersensitivity in the left L4-5 nerve root distribution.  Decreased range of motion.   MRI LUMBAR SPINE WITHOUT AND WITH CONTRAST  Technique:  Multiplanar and multiecho pulse sequences of the lumbar spine were obtained without and with intravenous contrast.  Contrast: 1m MULTIHANCE GADOBENATE DIMEGLUMINE 529 MG/ML IV SOLN  Comparison: Lumbar MRIs dated 08/02/2010 and 01/08/2009   Findings: Normal conus tip is at L2.  Normal paraspinal soft tissues.  T11-12 through L2-3:  No significant abnormalities.  Old Schmorl's node in the superior plate of L1, unchanged.  Minimal disc bulging and osteophytes to the right at T12-L1 with no impingement, unchanged.  L3-4:  Tiny central disc bulge with new desiccation of the disc. No neural impingement.  Prior right hemilaminotomy. Slight degenerative changes of the facet joints, right greater than left, minimally progressed.  L4-5:  Chronic marked disc space narrowing.  Tiny central disc bulge and osteophyte touching the ventral aspect of the thecal sac, unchanged, without neural impingement.  Previous laminotomies.  No neural impingement.  No pathologic enhancement after contrast.  L5-S1:  Previous left laminotomy. Disc bulge lateral to the right neural foramen which appears to have a mass effect upon the right L5 nerve lateral to the neural foramen.  Far lateral osteophyte formation to the left without impingement upon the left L5 nerve. These findings are unchanged. Slight right facet arthritis, unchanged.  IMPRESSION: 1.  Slight progression of mild degenerative facet arthritis at L3-4 on the right. 2.  Slight new degeneration of the  L3-4 disc with a tiny central bulge with no neural impingement. 3.  Chronic right far lateral disc bulge at L5-S1 which could affect the right L5 nerve, unchanged since 01/08/2009.   Original Report Authenticated By: JLorriane Shire M.D.    Lumbar DG Bending views:  Results for orders placed during the hospital encounter of 10/18/15  DG Lumbar Spine Complete W/Bend   Narrative CLINICAL DATA:  Right-sided low back pain, fell many years ago, no recent trauma  EXAM: LUMBAR SPINE - COMPLETE WITH BENDING VIEWS  COMPARISON:  MR lumbar spine of 08/07/2012, and lumbar spine plain films of 06/14/2010  FINDINGS: The lumbar vertebrae remain in normal alignment. There is degenerative disc  disease most marked at L4-5 which has not changed significantly compared to the lateral lumbar spine film from 2012. A mild compression deformity of L1 appears old with degenerative spurring present. No acute abnormality is seen. Through flexion and extension there is very limited range of motion with no malalignment. Degenerate changes noted involve the facet joints particularly of L4-5 and L5-S1. A neurostimulator battery pack overlies the left buttock with electrodes extending cephalad into the lower thoracic spinal canal.  IMPRESSION: 1. Normal alignment with little change in degenerative disc disease particularly at L4-5. 2. Old mild compression deformity of L1. 3. Very limited range of motion through flexion and extension.   Electronically Signed   By: Ivar Drape M.D.   On: 10/19/2015 08:30    Hip Imaging: Hip-R DG 2-3 views:  Results for orders placed during the hospital encounter of 10/18/15  DG HIP UNILAT W OR W/O PELVIS 2-3 VIEWS RIGHT   Narrative CLINICAL DATA:  Low back pain, right greater than left, no acute trauma  EXAM: DG HIP (WITH OR WITHOUT PELVIS) 2-3V RIGHT  COMPARISON:  Pelvis and hip films of 08/07/2012  FINDINGS: There are degenerative changes in both hips right  greater than left with slightly more loss of joint space on the right and spurring. No acute fracture is seen. The pelvic rami are intact. The SI joints are corticated.  IMPRESSION: Degenerative joint disease of the hips, right greater than left. No acute abnormality.   Electronically Signed   By: Ivar Drape M.D.   On: 10/19/2015 08:32    Hip-L DG 2-3 views:  Results for orders placed during the hospital encounter of 05/29/16  DG Hip Unilat W or Wo Pelvis 2-3 Views Left   Narrative CLINICAL DATA:  Fall with left hip pain  EXAM: DG HIP (WITH OR WITHOUT PELVIS) 2-3V LEFT  COMPARISON:  10/18/2015  FINDINGS: Left lower quadrant generator again noted. The right femoral head appears normally positioned and there are degenerative changes of the right hip as before.  Pubic symphysis appears intact. Mildly comminuted intertrochanteric fracture of the proximal left femur are with mild varus angulation. Left femoral head projects in joint.  IMPRESSION: Acute mildly angulated left intertrochanteric fracture.   Electronically Signed   By: [name] M.D.   On: 05/29/2016 03:35    Note: Results of ordered imaging test(s) reviewed and explained to patient in Layman's terms. Copy of results provided to patient  Meds   Current Outpatient Prescriptions:  .  alendronate (FOSAMAX) 10 MG tablet, Take 10 mg by mouth daily before breakfast. Take with a full glass of water on an empty stomach., Disp: , Rfl:  .  aspirin EC 81 MG tablet, Take 81 mg by mouth daily., Disp: , Rfl:  .  atorvastatin (LIPITOR) 40 MG tablet, Take by mouth., Disp: , Rfl:  .  Cholecalciferol (VITAMIN D3) 5000 units TABS, Take 5,000 Units by mouth daily., Disp: , Rfl:  .  clonazePAM (KLONOPIN) 0.5 MG tablet, Take 1/2 tablet 3 times a day, Disp: , Rfl: 0 .  docusate sodium (COLACE) 100 MG capsule, Take 100 mg by mouth daily., Disp: , Rfl:  .  Multiple Vitamin (MULTIVITAMIN) tablet, Take 1 tablet by mouth daily.,  Disp: , Rfl:  .  pantoprazole (PROTONIX) 40 MG tablet, Take 40 mg by mouth 2 (two) times daily. , Disp: , Rfl:  .  polyethylene glycol (MIRALAX / GLYCOLAX) packet, Take 17 g by mouth., Disp: , Rfl:  .  potassium chloride  SA (K-DUR,KLOR-CON) 20 MEQ tablet, Take 2 tablets (40 mEq total) by mouth daily. (Patient taking differently: Take 40 mEq by mouth 2 (two) times daily. ), Disp: 60 tablet, Rfl: 4 .  QUEtiapine (SEROQUEL) 25 MG tablet, Take 1-2 tablets by mouth at bedtime., Disp: , Rfl: 0 .  senna (SENOKOT) 8.6 MG TABS tablet, Take 1 tablet by mouth daily., Disp: , Rfl:  .  venlafaxine XR (EFFEXOR-XR) 150 MG 24 hr capsule, Take 150 mg by mouth daily with breakfast., Disp: , Rfl:   ROS  Constitutional: Denies any fever or chills Gastrointestinal: No reported hemesis, hematochezia, vomiting, or acute GI distress Musculoskeletal: Denies any acute onset joint swelling, redness, loss of ROM, or weakness Neurological: No reported episodes of acute onset apraxia, aphasia, dysarthria, agnosia, amnesia, paralysis, loss of coordination, or loss of consciousness  Allergies  Wanda [name] is allergic to levofloxacin; mobic [meloxicam]; propoxyphene hcl; tramadol; and codeine.  [place]  Drug: Wanda [name]  reports that she does not use drugs. Alcohol:  reports that she drinks alcohol. Tobacco:  reports that she quit smoking about 26 years ago. Her smoking use included Cigarettes. She has a 3.50 pack-year smoking history. She quit smokeless tobacco use about 26 years ago. 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 Surgical: Wanda [name]  has a past surgical history that includes Shoulder surgery; Appendectomy;  Abdominal hysterectomy; Back surgery; Spinal cord stimulator; Colonoscopy (N/A, 08/28/2013); Breast excisional biopsy (Right); and Intramedullary (im) nail intertrochanteric (Left, 05/30/2016). Family: family history includes Alcohol abuse in her daughter; Breast cancer in her paternal aunt; COPD in her father and mother; Cancer in her cousin and paternal aunt; Depression in her daughter; Diabetes in her cousin, father, mother, paternal grandmother, and sister; Heart attack (age of onset: 67) in her father; Heart disease in her father and mother; Tuberculosis in her maternal aunt.  Constitutional Exam  General appearance: Well nourished, well developed, and well hydrated. In no apparent acute distress Vitals:   02/12/17 1333  BP: 120/84  Pulse: (!) 119  Resp: 16  Temp: 98.2 F (36.8 C)  TempSrc: Oral  SpO2: 98%  Weight: 136 lb (61.7 kg)  Height: '5\' 1"'$  (1.549 m)   BMI Assessment: Estimated body mass index is 25.7 kg/m as calculated from the following:   Height as of this encounter: '5\' 1"'$  (1.549 m).   Weight as of this encounter: 136 lb (61.7 kg).  BMI interpretation table: BMI level Category Range association with higher incidence of chronic pain  <18 kg/m2 Underweight   18.5-24.9 kg/m2 Ideal body weight   25-29.9 kg/m2 Overweight Increased incidence by 20%  30-34.9 kg/m2 Obese (Class I) Increased incidence by 68%  35-39.9 kg/m2 Severe obesity (Class II) Increased incidence by 136%  >40 kg/m2 Extreme obesity (Class III) Increased incidence by 254%   BMI Readings from Last 4 Encounters:  02/12/17 25.70 kg/m  02/06/17 25.74 kg/m  01/04/17 25.32 kg/m  10/18/16 24.61 kg/m   Wt Readings from Last 4 Encounters:  02/12/17 136 lb (61.7 kg)  02/06/17 136 lb 4 oz (61.8 kg)  01/04/17 134 lb (60.8 kg)  10/18/16 130 lb 4 oz (59.1 kg)  Psych/Mental status: Alert, oriented x 3 (person, place, & time)       Eyes: PERLA Respiratory: No evidence of acute respiratory distress  Cervical  Spine Area Exam  Skin & Axial Inspection: No masses, redness, edema, swelling, or associated skin lesions Alignment: Symmetrical Functional ROM: Unrestricted ROM      Stability: No instability detected Muscle Tone/Strength: Functionally intact. No obvious neuro-muscular anomalies detected. Sensory (Neurological): Unimpaired Palpation: No palpable anomalies              Upper Extremity (UE) Exam    Side: Right upper extremity  Side: Left upper extremity  Skin & Extremity Inspection: Skin color, temperature, and hair growth are WNL. No peripheral edema or cyanosis. No masses, redness, swelling, asymmetry, or associated skin lesions. No contractures.  Skin & Extremity Inspection: Skin color, temperature, and hair growth are WNL. No peripheral edema or cyanosis. No masses, redness, swelling, asymmetry, or associated skin lesions. No contractures.  Functional ROM: Unrestricted ROM          Functional ROM: Unrestricted ROM          Muscle Tone/Strength: Functionally intact. No obvious neuro-muscular anomalies detected.  Muscle Tone/Strength: Functionally intact. No obvious neuro-muscular anomalies detected.  Sensory (Neurological): Unimpaired          Sensory (Neurological): Unimpaired          Palpation: No palpable anomalies              Palpation: No palpable anomalies              Specialized Test(s): Deferred         Specialized Test(s): Deferred          Thoracic Spine Area Exam  Skin & Axial Inspection: No masses, redness, or swelling Alignment: Symmetrical Functional ROM: Unrestricted ROM Stability: No instability detected Muscle Tone/Strength: Functionally intact. No obvious neuro-muscular anomalies detected. Sensory (Neurological): Unimpaired Muscle strength & Tone: No palpable anomalies  Lumbar Spine Area Exam  Skin & Axial Inspection: No masses, redness, or swelling Alignment: Symmetrical Functional ROM: Decreased ROM      Stability: No instability detected Muscle Tone/Strength:  Functionally intact. No obvious neuro-muscular anomalies detected. Sensory (Neurological): Movement-associated pain Palpation: Complains of area being tender to palpation       Provocative Tests: Lumbar Hyperextension and rotation test: Positive on the left for facet joint pain. Lumbar Lateral bending test: evaluation deferred today       Patrick's Maneuver: evaluation deferred today                    Gait & Posture Assessment  Ambulation: Unassisted Gait: Antalgic Posture: Antalgic   Lower Extremity Exam    Side: Right lower extremity  Side: Left lower extremity  Skin & Extremity Inspection: Skin color, temperature, and hair growth are WNL. No peripheral edema or cyanosis. No masses, redness, swelling, asymmetry, or associated skin lesions. No contractures.  Skin & Extremity Inspection: Skin color, temperature, and hair growth are WNL. No peripheral edema or cyanosis. No masses, redness, swelling, asymmetry, or associated skin lesions. No contractures.  Functional ROM: Unrestricted ROM          Functional ROM: Unrestricted ROM          Muscle Tone/Strength: Functionally intact. No obvious neuro-muscular anomalies detected.  Muscle Tone/Strength: Functionally intact. No obvious neuro-muscular anomalies detected.  Sensory (Neurological): Unimpaired  Sensory (Neurological): Unimpaired  Palpation: No palpable anomalies  Palpation: No palpable anomalies   Assessment  Primary Diagnosis & Pertinent Problem List: The primary encounter diagnosis was Lumbar facet syndrome (Bilateral) (L>R). Diagnoses of Chronic low back pain (Primary Source of Pain) (Bilateral) (L>R), Lumbar facet  arthropathy ([place]), Lumbar spondylosis, Chronic lower extremity pain (Secondary source of pain) (Right), Pharmacologic therapy, Polypharmacy, Disorder of skeletal system, and Problems influencing health status were also pertinent to this visit.  Status Diagnosis  Persistent Improved Stable 1. Lumbar facet syndrome  (Bilateral) (L>R)   2. Chronic low back pain (Primary Source of Pain) (Bilateral) (L>R)   3. Lumbar facet arthropathy ([place])   4. Lumbar spondylosis   5. Chronic lower extremity pain (Secondary source of pain) (Right)   6. Pharmacologic therapy   7. Polypharmacy   8. Disorder of skeletal system   9. Problems influencing health status     Problems updated and reviewed during this visit: Problem  Chronic lower extremity pain (Left)  Chronic hip pain (Right)  Pharmacologic Therapy  Polypharmacy  Disorder of Skeletal System  Problems Influencing Health Status   Plan of Care  Pharmacotherapy (Medications Ordered): No orders of the defined types were placed in this encounter.  New Prescriptions   No medications on file   Medications administered today: Wanda [name] had no medications administered during this visit.   Procedure Orders     LUMBAR FACET(MEDIAL BRANCH NERVE BLOCK) [facility]   Lab Orders  No laboratory test(s) ordered today   Imaging Orders  No imaging studies ordered today   Referral Orders  No referral(s) requested today    Interventional management options: Planned, scheduled, and/or pending:   Diagnostic Left lumbar facet block under fluoroscopic guidance and no IV sedation #3    Considering:   Diagnostic bilateral lumbar facet block #3  Possible bilateral lumbar facet RFA  Diagnostic (Midline) L1 to lumbar epidural steroid injection    Palliative PRN treatment(s):   None at this time   Provider-requested follow-up: Return for Procedure (no sedation): (L) L-FCT Blk.  Future Appointments Date Time Provider [place]  02/20/2017 10:15 AM Milinda Pointer, MD [facility] None  04/10/2017 1:20 PM End, Harrell Gave, MD [id number]   Primary Care Physician: Herminio Commons, MD Location: [facility] Outpatient Pain Management Facility Note by: Gaspar Cola, MD Date: 02/12/2017; Time: 2:00 PM

## 2017-02-13 DIAGNOSIS — M899 Disorder of bone, unspecified: Secondary | ICD-10-CM | POA: Insufficient documentation

## 2017-02-13 DIAGNOSIS — Z789 Other specified health status: Secondary | ICD-10-CM | POA: Insufficient documentation

## 2017-02-13 DIAGNOSIS — Z79899 Other long term (current) drug therapy: Secondary | ICD-10-CM | POA: Insufficient documentation

## 2017-02-20 ENCOUNTER — Ambulatory Visit (HOSPITAL_BASED_OUTPATIENT_CLINIC_OR_DEPARTMENT_OTHER): Payer: Medicare HMO | Admitting: Pain Medicine

## 2017-02-20 ENCOUNTER — Encounter: Payer: Self-pay | Admitting: Pain Medicine

## 2017-02-20 ENCOUNTER — Ambulatory Visit
Admission: RE | Admit: 2017-02-20 | Discharge: 2017-02-20 | Disposition: A | Discharge status: Home / Self Care | Payer: Medicare HMO | Source: Ambulatory Visit | Attending: Pain Medicine | Admitting: Pain Medicine

## 2017-02-20 VITALS — BP 118/75 | HR 107 | Temp 97.9°F | Resp 18 | Ht 61.0 in | Wt 136.0 lb

## 2017-02-20 DIAGNOSIS — M4696 Unspecified inflammatory spondylopathy, lumbar region: Secondary | ICD-10-CM

## 2017-02-20 DIAGNOSIS — G8929 Other chronic pain: Secondary | ICD-10-CM | POA: Insufficient documentation

## 2017-02-20 DIAGNOSIS — M47816 Spondylosis without myelopathy or radiculopathy, lumbar region: Secondary | ICD-10-CM

## 2017-02-20 DIAGNOSIS — M545 Low back pain: Secondary | ICD-10-CM | POA: Diagnosis present

## 2017-02-20 DIAGNOSIS — Z885 Allergy status to narcotic agent status: Secondary | ICD-10-CM | POA: Diagnosis not present

## 2017-02-20 DIAGNOSIS — M519 Unspecified thoracic, thoracolumbar and lumbosacral intervertebral disc disorder: Secondary | ICD-10-CM | POA: Insufficient documentation

## 2017-02-20 MED ORDER — FENTANYL CITRATE (PF) 100 MCG/2ML IJ SOLN: 25.0000 ug | INTRAMUSCULAR | Status: DC | PRN

## 2017-02-20 MED ORDER — LIDOCAINE HCL 2 % IJ SOLN
10.0000 mL | Freq: Once | INTRAMUSCULAR | Status: AC
  Administered 2017-02-20: 400 mg
  Filled 2017-02-20: qty 20

## 2017-02-20 MED ORDER — LACTATED RINGERS IV SOLN: 1000.0000 mL | Freq: Once | INTRAVENOUS | Status: DC

## 2017-02-20 MED ORDER — TRIAMCINOLONE ACETONIDE 40 MG/ML IJ SUSP
40.0000 mg | Freq: Once | INTRAMUSCULAR | Status: AC
  Administered 2017-02-20: 40 mg
  Filled 2017-02-20: qty 1

## 2017-02-20 MED ORDER — MIDAZOLAM HCL 5 MG/5ML IJ SOLN: 1.0000 mg | INTRAMUSCULAR | Status: DC | PRN

## 2017-02-20 MED ORDER — ROPIVACAINE HCL 2 MG/ML IJ SOLN
9.0000 mL | Freq: Once | INTRAMUSCULAR | Status: AC
  Administered 2017-02-20: 9 mL via PERINEURAL
  Filled 2017-02-20: qty 10

## 2017-02-20 NOTE — Progress Notes (Signed)
Patient's Name: Wanda [name]  MRN: [id number]  Referring Provider: Milinda Pointer, MD  DOB: August 13, 1955  PCP: Herminio Commons, MD  DOS: 02/20/2017  Note by: Gaspar Cola, MD  Service setting: Ambulatory outpatient  Specialty: Interventional Pain Management  Patient type: Established  Location: [facility] (AMB) Pain Management Facility  Visit type: Interventional Procedure   Primary Reason for Visit: Interventional Pain Management Treatment. CC: Back Pain (low)  Procedure:  Anesthesia, Analgesia, Anxiolysis:  Type: Diagnostic Medial Branch Facet Block Region: Lumbar Level: L2, L3, L4, L5, & S1 Medial Branch Level(s) Laterality: Left  Type: Local Anesthesia Local Anesthetic: Lidocaine 1% Route: Infiltration ([place]/IM) IV Access: Declined Sedation: Declined  Indication(s): Analgesia          Indications: 1. Lumbar facet syndrome (Bilateral) (R>L)   2. Lumbar facet arthropathy (HCC)   3. Chronic low back pain (Primary Source of Pain) (Bilateral) (R>L)   4. Chronic low back pain (Primary Source of Pain) (Bilateral) (L>R)   5. Lumbar facet syndrome (Bilateral) (L>R)    Pain Score: Pre-procedure: 3 /10 Post-procedure: 3 /10  Pre-op Assessment:  Wanda [name] is a 61 y.o. (year old), female patient, seen today for interventional treatment. She  has a past surgical history that includes Shoulder surgery; Appendectomy; Abdominal hysterectomy; Back surgery; Spinal cord stimulator; Colonoscopy (N/A, 08/28/2013); Breast excisional biopsy (Right); and Intramedullary (im) nail intertrochanteric (Left, 05/30/2016). Wanda [name] has a current medication list which includes the following prescription(s): alendronate, aspirin ec, atorvastatin, vitamin d3, clonazepam, docusate sodium, multivitamin, pantoprazole, polyethylene glycol, potassium chloride sa, quetiapine, senna, and venlafaxine xr. Her primarily concern today is the Back Pain (low)  Initial Vital Signs: Last menstrual period  09/30/1991. BMI: Estimated body mass index is 25.7 kg/m as calculated from the following:   Height as of this encounter: 5\' 1"  (1.549 m).   Weight as of this encounter: 136 lb (61.7 kg).  Risk Assessment: Allergies: Reviewed. She is allergic to levofloxacin; mobic [meloxicam]; propoxyphene hcl; tramadol; and codeine.  Allergy Precautions: None required Coagulopathies: Reviewed. None identified.  Blood-thinner therapy: None at this time Active Infection(s): Reviewed. None identified. Wanda [name] is afebrile  Site Confirmation: Wanda [name] was asked to confirm the procedure and laterality before marking the site Procedure checklist: Completed Consent: Before the procedure and under the influence of no sedative(s), amnesic(s), or anxiolytics, the patient was informed of the treatment options, risks and possible complications. To fulfill our ethical and legal obligations, as recommended by the American Medical Association's Code of Ethics, I have informed the patient of my clinical impression; the nature and purpose of the treatment or procedure; the risks, benefits, and possible complications of the intervention; the alternatives, including doing nothing; the risk(s) and benefit(s) of the alternative treatment(s) or procedure(s); and the risk(s) and benefit(s) of doing nothing. The patient was provided information about the general risks and possible complications associated with the procedure. These may include, but are not limited to: failure to achieve desired goals, infection, bleeding, organ or nerve damage, allergic reactions, paralysis, and death. In addition, the patient was informed of those risks and complications associated to Spine-related procedures, such as failure to decrease pain; infection (i.e.: Meningitis, epidural or intraspinal abscess); bleeding (i.e.: epidural hematoma, subarachnoid hemorrhage, or any other type of intraspinal or peri-dural bleeding); organ or nerve damage  (i.e.: Any type of peripheral nerve, nerve root, or spinal cord injury) with subsequent damage to sensory, motor, and/or autonomic systems, resulting in permanent pain, numbness, and/or weakness of one or several  areas of the body; allergic reactions; (i.e.: anaphylactic reaction); and/or death. Furthermore, the patient was informed of those risks and complications associated with the medications. These include, but are not limited to: allergic reactions (i.e.: anaphylactic or anaphylactoid reaction(s)); adrenal axis suppression; blood sugar elevation that in diabetics may result in ketoacidosis or comma; water retention that in patients with history of congestive heart failure may result in shortness of breath, pulmonary edema, and decompensation with resultant heart failure; weight gain; swelling or edema; medication-induced neural toxicity; particulate matter embolism and blood vessel occlusion with resultant organ, and/or nervous system infarction; and/or aseptic necrosis of one or more joints. Finally, the patient was informed that Medicine is not an exact science; therefore, there is also the possibility of unforeseen or unpredictable risks and/or possible complications that may result in a catastrophic outcome. The patient indicated having understood very clearly. We have given the patient no guarantees and we have made no promises. Enough time was given to the patient to ask questions, all of which were answered to the patient's satisfaction. Wanda [name] has indicated that she wanted to continue with the procedure. Attestation: I, the ordering provider, attest that I have discussed with the patient the benefits, risks, side-effects, alternatives, likelihood of achieving goals, and potential problems during recovery for the procedure that I have provided informed consent. Date: 02/20/2017; Time: 8:05 AM  Pre-Procedure Preparation:  Monitoring: As per clinic protocol. Respiration, ETCO2, SpO2, BP, heart  rate and rhythm monitor placed and checked for adequate function Safety Precautions: Patient was assessed for positional comfort and pressure points before starting the procedure. Time-out: I initiated and conducted the "Time-out" before starting the procedure, as per protocol. The patient was asked to participate by confirming the accuracy of the "Time Out" information. Verification of the correct person, site, and procedure were performed and confirmed by me, the nursing staff, and the patient. "Time-out" conducted as per Joint Commission's Universal Protocol (UP.01.01.01). "Time-out" Date & Time: 02/20/2017; 1029 hrs.  Description of Procedure Process:   Position: Prone Target Area: For Lumbar Facet blocks, the target is the groove formed by the junction of the transverse process and superior articular process. For the L5 dorsal ramus, the target is the notch between superior articular process and sacral ala. For the S1 dorsal ramus, the target is the superior and lateral edge of the posterior S1 Sacral foramen. Approach: Paramedial approach. Area Prepped: Entire Posterior Lumbosacral Region Prepping solution: ChloraPrep (2% chlorhexidine gluconate and 70% isopropyl alcohol) Safety Precautions: Aspiration looking for blood return was conducted prior to all injections. At no point did we inject any substances, as a needle was being advanced. No attempts were made at seeking any paresthesias. Safe injection practices and needle disposal techniques used. Medications properly checked for expiration dates. SDV (single dose vial) medications used. Description of the Procedure: Protocol guidelines were followed. The patient was placed in position over the fluoroscopy table. The target area was identified and the area prepped in the usual manner. Skin desensitized using vapocoolant spray. Skin & deeper tissues infiltrated with local anesthetic. Appropriate amount of time allowed to pass for local anesthetics  to take effect. The procedure needle was introduced through the skin, ipsilateral to the reported pain, and advanced to the target area. Employing the "Medial Branch Technique", the needles were advanced to the angle made by the superior and medial portion of the transverse process, and the lateral and inferior portion of the superior articulating process of the targeted vertebral bodies. This area is  known as "[place]'s Eye" or the "Eye of the [place] Dog". A procedure needle was introduced through the skin, and this time advanced to the angle made by the superior and medial border of the sacral ala, and the lateral border of the S1 vertebral body. This last needle was later repositioned at the superior and lateral border of the posterior S1 foramen. Negative aspiration confirmed. Solution injected in intermittent fashion, asking for systemic symptoms every 0.5cc of injectate. The needles were then removed and the area cleansed, making sure to leave some of the prepping solution back to take advantage of its long term bactericidal properties.   Illustration of the posterior view of the lumbar spine and the posterior neural structures. Laminae of L2 through S1 are labeled. DPRL5, dorsal primary ramus of L5; DPRS1, dorsal primary ramus of S1; DPR3, dorsal primary ramus of L3; FJ, facet (zygapophyseal) joint L3-L4; I, inferior articular process of L4; LB1, lateral branch of dorsal primary ramus of L1; IAB, inferior articular branches from L3 medial branch (supplies L4-L5 facet joint); IBP, intermediate branch plexus; MB3, medial branch of dorsal primary ramus of L3; NR3, third lumbar nerve root; S, superior articular process of L5; SAB, superior articular branches from L4 (supplies L4-5 facet joint also); TP3, transverse process of L3.  Vitals:   02/20/17 0951 02/20/17 1029 02/20/17 1034 02/20/17 1036  BP: 104/80 113/86 123/87 118/75  Pulse: (!) 107     Resp: 16 18 15 18   Temp: 97.9 F (36.6 C)     SpO2:  100% 96% 98% 98%  Weight: 136 lb (61.7 kg)     Height: 5\' 1"  (1.549 m)       Start Time: 1029 hrs. End Time: 1035 hrs. Materials:  Needle(s) Type: Regular needle Gauge: 22G Length: 3.5-in Medication(s): We administered lidocaine, triamcinolone acetonide, and ropivacaine (PF) 2 mg/mL (0.2%). Please see chart orders for dosing details.  Imaging Guidance (Spinal):  Type of Imaging Technique: Fluoroscopy Guidance (Spinal) Indication(s): Assistance in needle guidance and placement for procedures requiring needle placement in or near specific anatomical locations not easily accessible without such assistance. Exposure Time: Please see nurses notes. Contrast: None used. Fluoroscopic Guidance: I was personally present during the use of fluoroscopy. "Tunnel Vision Technique" used to obtain the best possible view of the target area. Parallax error corrected before commencing the procedure. "Direction-depth-direction" technique used to introduce the needle under continuous pulsed fluoroscopy. Once target was reached, antero-posterior, oblique, and lateral fluoroscopic projection used confirm needle placement in all planes. Images permanently stored in EMR. Interpretation: No contrast injected. I personally interpreted the imaging intraoperatively. Adequate needle placement confirmed in multiple planes. Permanent images saved into the patient's record.  Antibiotic Prophylaxis:  Indication(s): None identified Antibiotic given: None  Post-operative Assessment:  EBL: None Complications: No immediate post-treatment complications observed by team, or reported by patient. Note: The patient tolerated the entire procedure well. A repeat set of vitals were taken after the procedure and the patient was kept under observation following institutional policy, for this type of procedure. Post-procedural neurological assessment was performed, showing return to baseline, prior to discharge. The patient was provided  with post-procedure discharge instructions, including a section on how to identify potential problems. Should any problems arise concerning this procedure, the patient was given instructions to immediately contact us, at any time, without hesitation. In any case, we plan to contact the patient by telephone for a follow-up status report regarding this interventional procedure. Comments:  No additional relevant information.  Plan of  Care  Disposition: Discharge home  Discharge Date & Time: 02/20/2017; 1045 hrs.   Physician-requested Follow-up:  Return for post-procedure eval by Dr. Dossie Arbour in 2 weeks.  Future Appointments Date Time Provider [place]  03/07/2017 1:15 PM Milinda Pointer, MD [facility] None  04/10/2017 1:20 PM End, Harrell Gave, MD [id number]    Imaging Orders     DG C-Arm 1-60 Min-No Report  Procedure Orders     LUMBAR FACET(MEDIAL BRANCH NERVE BLOCK) MBNB  Medications ordered for procedure: Meds ordered this encounter  Medications  . lidocaine (XYLOCAINE) 2 % (with pres) injection 200 mg  . triamcinolone acetonide (KENALOG-40) injection 40 mg  . ropivacaine (PF) 2 mg/mL (0.2%) (NAROPIN) injection 9 mL   Medications administered: We administered lidocaine, triamcinolone acetonide, and ropivacaine (PF) 2 mg/mL (0.2%).  See the medical record for exact dosing, route, and time of administration.  New Prescriptions   No medications on file   Primary Care Physician: Herminio Commons, MD Location: [facility] Note by: Gaspar Cola, MD Date: 02/20/2017; Time: 10:59 AM  Disclaimer:  Medicine is not an exact science. The only guarantee in medicine is that nothing is guaranteed. It is important to note that the decision to proceed with this intervention was based on the information collected from the patient. The Data and conclusions were drawn from the patient's questionnaire, the interview, and the physical  examination. Because the information was provided in large part by the patient, it cannot be guaranteed that it has not been purposely or unconsciously manipulated. Every effort has been made to obtain as much relevant data as possible for this evaluation. It is important to note that the conclusions that lead to this procedure are derived in large part from the available data. Always take into account that the treatment will also be dependent on availability of resources and existing treatment guidelines, considered by other Pain Management Practitioners as being common knowledge and practice, at the time of the intervention. For Medico-Legal purposes, it is also important to point out that variation in procedural techniques and pharmacological choices are the acceptable norm. The indications, contraindications, technique, and results of the above procedure should only be interpreted and judged by a Board-Certified Interventional Pain Specialist with extensive familiarity and expertise in the same exact procedure and technique.

## 2017-02-20 NOTE — Progress Notes (Signed)
Safety precautions to be maintained throughout the outpatient stay will include: orient to surroundings, keep bed in low position, maintain call bell within reach at all times, provide assistance with transfer out of bed and ambulation.  

## 2017-02-20 NOTE — Patient Instructions (Signed)

## 2017-02-21 ENCOUNTER — Telehealth: Payer: Self-pay | Admitting: *Deleted

## 2017-02-21 NOTE — Telephone Encounter (Signed)
Voicemail left with patient to call our office if there are questions or concerns re; procedure on yesterday.  

## 2017-02-28 DIAGNOSIS — E876 Hypokalemia: Secondary | ICD-10-CM | POA: Diagnosis not present

## 2017-03-01 DIAGNOSIS — E876 Hypokalemia: Secondary | ICD-10-CM | POA: Diagnosis not present

## 2017-03-07 ENCOUNTER — Ambulatory Visit: Payer: Medicare HMO | Admitting: Pain Medicine

## 2017-03-21 ENCOUNTER — Encounter: Payer: Self-pay | Admitting: Pain Medicine

## 2017-03-21 ENCOUNTER — Ambulatory Visit: Payer: Medicare HMO | Attending: Pain Medicine | Admitting: Pain Medicine

## 2017-03-21 VITALS — BP 123/79 | HR 102 | Temp 97.8°F | Resp 16 | Ht 61.0 in | Wt 134.0 lb

## 2017-03-21 DIAGNOSIS — M810 Age-related osteoporosis without current pathological fracture: Secondary | ICD-10-CM | POA: Diagnosis not present

## 2017-03-21 DIAGNOSIS — M25551 Pain in right hip: Secondary | ICD-10-CM | POA: Insufficient documentation

## 2017-03-21 DIAGNOSIS — M4856XA Collapsed vertebra, not elsewhere classified, lumbar region, initial encounter for fracture: Secondary | ICD-10-CM | POA: Insufficient documentation

## 2017-03-21 DIAGNOSIS — R7303 Prediabetes: Secondary | ICD-10-CM | POA: Diagnosis not present

## 2017-03-21 DIAGNOSIS — Z79899 Other long term (current) drug therapy: Secondary | ICD-10-CM | POA: Insufficient documentation

## 2017-03-21 DIAGNOSIS — N3946 Mixed incontinence: Secondary | ICD-10-CM | POA: Insufficient documentation

## 2017-03-21 DIAGNOSIS — F419 Anxiety disorder, unspecified: Secondary | ICD-10-CM | POA: Insufficient documentation

## 2017-03-21 DIAGNOSIS — M549 Dorsalgia, unspecified: Secondary | ICD-10-CM | POA: Diagnosis present

## 2017-03-21 DIAGNOSIS — M8580 Other specified disorders of bone density and structure, unspecified site: Secondary | ICD-10-CM | POA: Insufficient documentation

## 2017-03-21 DIAGNOSIS — M545 Low back pain, unspecified: Secondary | ICD-10-CM

## 2017-03-21 DIAGNOSIS — G894 Chronic pain syndrome: Secondary | ICD-10-CM | POA: Diagnosis not present

## 2017-03-21 DIAGNOSIS — Z87442 Personal history of urinary calculi: Secondary | ICD-10-CM | POA: Insufficient documentation

## 2017-03-21 DIAGNOSIS — K5909 Other constipation: Secondary | ICD-10-CM | POA: Diagnosis not present

## 2017-03-21 DIAGNOSIS — E876 Hypokalemia: Secondary | ICD-10-CM | POA: Insufficient documentation

## 2017-03-21 DIAGNOSIS — M79604 Pain in right leg: Secondary | ICD-10-CM | POA: Diagnosis not present

## 2017-03-21 DIAGNOSIS — E785 Hyperlipidemia, unspecified: Secondary | ICD-10-CM | POA: Diagnosis not present

## 2017-03-21 DIAGNOSIS — M47816 Spondylosis without myelopathy or radiculopathy, lumbar region: Secondary | ICD-10-CM | POA: Insufficient documentation

## 2017-03-21 DIAGNOSIS — G8929 Other chronic pain: Secondary | ICD-10-CM | POA: Diagnosis not present

## 2017-03-21 DIAGNOSIS — K219 Gastro-esophageal reflux disease without esophagitis: Secondary | ICD-10-CM | POA: Diagnosis not present

## 2017-03-21 DIAGNOSIS — J309 Allergic rhinitis, unspecified: Secondary | ICD-10-CM | POA: Insufficient documentation

## 2017-03-21 DIAGNOSIS — I471 Supraventricular tachycardia: Secondary | ICD-10-CM | POA: Insufficient documentation

## 2017-03-21 DIAGNOSIS — Z87891 Personal history of nicotine dependence: Secondary | ICD-10-CM | POA: Insufficient documentation

## 2017-03-21 DIAGNOSIS — R69 Illness, unspecified: Secondary | ICD-10-CM | POA: Diagnosis not present

## 2017-03-21 DIAGNOSIS — Z7982 Long term (current) use of aspirin: Secondary | ICD-10-CM | POA: Diagnosis not present

## 2017-03-21 DIAGNOSIS — F329 Major depressive disorder, single episode, unspecified: Secondary | ICD-10-CM | POA: Diagnosis not present

## 2017-03-21 DIAGNOSIS — G43009 Migraine without aura, not intractable, without status migrainosus: Secondary | ICD-10-CM | POA: Insufficient documentation

## 2017-03-21 NOTE — Progress Notes (Signed)
Patient's Name: Wanda [name]  MRN: [id number]  Referring Provider: Herminio Commons, MD  DOB: May 18, 1956  PCP: Gennette Pac, FNP  DOS: 03/21/2017  Note by: Gaspar Cola, MD  Service setting: Ambulatory outpatient  Specialty: Interventional Pain Management  Location: [facility] (AMB) Pain Management Facility    Patient type: Established   Primary Reason(s) for Visit: Encounter for post-procedure evaluation of chronic illness with mild to moderate exacerbation CC: Back Pain  HPI  Wanda [name] is a 61 y.o. year old, female patient, who comes today for a post-procedure evaluation. She has Hyperlipidemia; Hypokalemia; Anxiety and depression; Common migraine; SVT (supraventricular tachycardia) ([place]); Allergic rhinitis; Gastroesophageal reflux disease; Chronic constipation; Generalized osteoarthrosis of hand; Osteoporosis; Osteopenia; Mixed urinary incontinence; History of nephrolithiasis; Chronic prescription benzodiazepine use; Chronic low back pain (Primary Source of Pain) (Bilateral) (R>L); Spinal cord stimulator status (battery on left buttocks); Lumbar spondylosis; Chronic lower extremity pain (Left); Lumbar facet arthropathy; Lumbar facet syndrome (Bilateral) (R>L); Chronic lower extremity pain (Secondary source of pain) (Right); Chronic hip pain (Right); Osteoarthritis of hip (Right); Chronic neck pain; Closed fracture of hip ([place]); Orthostatic lightheadedness; Atypical chest pain; Chronic pain syndrome; Closed intertrochanteric fracture ([place]); History of chest pain; Compression fracture of L1 lumbar vertebra (old); Pharmacologic therapy; Polypharmacy; Disorder of skeletal system; and Problems influencing health status on her problem list. Her primarily concern today is the Back Pain  Pain Assessment: Location: Lower Back Radiating: no pain today Onset: More than a month ago Duration: Chronic pain Quality: Constant, Aching Severity: 0-No pain/10 (self-reported pain score)  Note: Reported  level is compatible with observation.                   When using our objective Pain Scale, levels between 6 and 10/10 are said to belong in an emergency room, as it progressively worsens from a 6/10, described as severely limiting, requiring emergency care not usually available at an outpatient pain management facility. At a 6/10 level, communication becomes difficult and requires great effort. Assistance to reach the emergency department may be required. Facial flushing and profuse sweating along with potentially dangerous increases in heart rate and blood pressure will be evident. Effect on ADL: pace self Timing: Constant Modifying factors: procedure  Wanda [name] comes in today for post-procedure evaluation after the treatment done on 02/20/2017.  Further details on both, my assessment(s), as well as the proposed treatment plan, please see below.  Post-Procedure Assessment  02/20/2017 Procedure: Diagnostic left sided lumbar facet block #3 under fluoroscopic guidance, no sedation. Pre-procedure pain score:  3/10 Post-procedure pain score: 3/10 (100% relief) Influential Factors: BMI: 25.32 kg/m Intra-procedural challenges: None observed.         Assessment challenges: None detected.              Reported side-effects: None.        Post-procedural adverse reactions or complications: None reported         Sedation: Sedation provided. When no sedatives are used, the analgesic levels obtained are directly associated to the effectiveness of the local anesthetics. However, when sedation is provided, the level of analgesia obtained during the initial 1 hour following the intervention, is believed to be the result of a combination of factors. These factors may include, but are not limited to: 1. The effectiveness of the local anesthetics used. 2. The effects of the analgesic(s) and/or anxiolytic(s) used. 3. The degree of discomfort experienced by the patient at the time of the procedure. 4. The  patients  ability and reliability in recalling and recording the events. 5. The presence and influence of possible secondary gains and/or psychosocial factors. Reported result: Relief experienced during the 1st hour after the procedure: 100 % (Ultra-Short Term Relief)            Interpretative annotation: Clinically appropriate result. Analgesia during this period is likely to be Local Anesthetic and/or IV Sedative (Analgesic/Anxiolytic) related.          Effects of local anesthetic: The analgesic effects attained during this period are directly associated to the localized infiltration of local anesthetics and therefore cary significant diagnostic value as to the etiological location, or anatomical origin, of the pain. Expected duration of relief is directly dependent on the pharmacodynamics of the local anesthetic used. Long-acting (4-6 hours) anesthetics used.  Reported result: Relief during the next 4 to 6 hour after the procedure: 100 % (Short-Term Relief)            Interpretative annotation: Clinically appropriate result. Analgesia during this period is likely to be Local Anesthetic-related.          Long-term benefit: Defined as the period of time past the expected duration of local anesthetics (1 hour for short-acting and 4-6 hours for long-acting). With the possible exception of prolonged sympathetic blockade from the local anesthetics, benefits during this period are typically attributed to, or associated with, other factors such as analgesic sensory neuropraxia, antiinflammatory effects, or beneficial biochemical changes provided by agents other than the local anesthetics.  Reported result: Extended relief following procedure: 100 % (Long-Term Relief)            Interpretative annotation: Clinically appropriate result. Good relief. No permanent benefit expected. Inflammation plays a part in the etiology to the pain.          Current benefits: Defined as reported results that persistent at this  point in time.   Analgesia: 100 % Wanda [name] reports improvement of axial symptoms. Function: Somewhat improved ROM: Somewhat improved Interpretative annotation: Recurrence of symptoms. No permanent benefit expected. Effective diagnostic intervention.          Interpretation: Results would suggest a successful diagnostic intervention.                  Plan:  Set up procedure as a PRN palliative treatment option for this patient.        Laboratory Chemistry  Inflammation Markers (CRP: Acute Phase) (ESR: Chronic Phase) No results found for: CRP, ESRSEDRATE               Renal Function Markers Lab Results  Component Value Date   BUN 6 (L) 02/06/2017   CREATININE 0.60 02/06/2017   GFRAA 114 02/06/2017   GFRNONAA 99 02/06/2017                 Hepatic Function Markers Lab Results  Component Value Date   AST 36 08/09/2016   ALT 29 08/09/2016   ALBUMIN 4.6 08/09/2016   ALKPHOS 84 08/09/2016                 Electrolytes Lab Results  Component Value Date   NA 143 02/06/2017   K 4.2 02/06/2017   CL 105 02/06/2017   CALCIUM 9.3 02/06/2017   MG 2.2 02/06/2017                 Neuropathy Markers No results found for: [id number]               Bone Pathology  Markers Lab Results  Component Value Date   ALKPHOS 84 08/09/2016   CALCIUM 9.3 02/06/2017                 Coagulation Parameters Lab Results  Component Value Date   PLT 232 02/06/2017                 Cardiovascular Markers Lab Results  Component Value Date   HGB 12.9 02/06/2017   HCT 38.4 02/06/2017                 Note: Lab results reviewed.  Recent Diagnostic Imaging Results  DG C-Arm 1-60 Min-No Report Fluoroscopy was utilized by the requesting physician.  No radiographic  interpretation.   Complexity Note: Imaging results reviewed. Results shared with Wanda [name], using Layman's terms.                         Meds   Current Outpatient Prescriptions:  .  alendronate (FOSAMAX) 10 MG tablet, Take  10 mg by mouth daily before breakfast. Take with a full glass of water on an empty stomach., Disp: , Rfl:  .  aspirin EC 81 MG tablet, Take 81 mg by mouth daily., Disp: , Rfl:  .  atorvastatin (LIPITOR) 40 MG tablet, Take 40 mg by mouth daily at 6 (six) AM. , Disp: , Rfl:  .  Cholecalciferol (VITAMIN D3) 5000 units TABS, Take 5,000 Units by mouth daily., Disp: , Rfl:  .  clonazePAM (KLONOPIN) 0.5 MG tablet, Take 1/2 tablet 3 times a day, Disp: , Rfl: 0 .  docusate sodium (COLACE) 100 MG capsule, Take 100 mg by mouth daily., Disp: , Rfl:  .  Multiple Vitamin (MULTIVITAMIN) tablet, Take 1 tablet by mouth daily., Disp: , Rfl:  .  pantoprazole (PROTONIX) 40 MG tablet, Take 40 mg by mouth 2 (two) times daily. , Disp: , Rfl:  .  polyethylene glycol (MIRALAX / GLYCOLAX) packet, Take 17 g by mouth as needed. , Disp: , Rfl:  .  potassium chloride SA (K-DUR,KLOR-CON) 20 MEQ tablet, Take 2 tablets (40 mEq total) by mouth daily. (Patient taking differently: Take 40 mEq by mouth 2 (two) times daily. ), Disp: 60 tablet, Rfl: 4 .  QUEtiapine (SEROQUEL) 25 MG tablet, Take 1-2 tablets by mouth at bedtime., Disp: , Rfl: 0 .  senna (SENOKOT) 8.6 MG TABS tablet, Take 1 tablet by mouth daily., Disp: , Rfl:  .  venlafaxine XR (EFFEXOR-XR) 150 MG 24 hr capsule, Take 150 mg by mouth daily with breakfast., Disp: , Rfl:   ROS  Constitutional: Denies any fever or chills Gastrointestinal: No reported hemesis, hematochezia, vomiting, or acute GI distress Musculoskeletal: Denies any acute onset joint swelling, redness, loss of ROM, or weakness Neurological: No reported episodes of acute onset apraxia, aphasia, dysarthria, agnosia, amnesia, paralysis, loss of coordination, or loss of consciousness  Allergies  Wanda [name] is allergic to levofloxacin; mobic [meloxicam]; propoxyphene hcl; tramadol; and codeine.  [place]  Drug: Wanda [name]  reports that she does not use drugs. Alcohol:  reports that she drinks  alcohol. Tobacco:  reports that she quit smoking about 26 years ago. Her smoking use included Cigarettes. She has a 3.50 pack-year smoking history. She quit smokeless tobacco use about 26 years ago. Medical:  has a past medical history of Anxiety; Borderline diabetes; BRONCHITIS, ACUTE WITH BRONCHOSPASM (06/25/2010); Depression; GASTROENTERITIS WITHOUT DEHYDRATION (05/14/2009); GERD (gastroesophageal reflux disease); Herpes; Hip fx, left, closed, with nonunion, subsequent encounter (  05/29/2016); Hyperlipidemia; and Irregular heart beat. Surgical: Wanda [name]  has a past surgical history that includes Shoulder surgery; Appendectomy; Abdominal hysterectomy; Back surgery; Spinal cord stimulator; Colonoscopy (N/A, 08/28/2013); Breast excisional biopsy (Right); and Intramedullary (im) nail intertrochanteric (Left, 05/30/2016). Family: family history includes Alcohol abuse in her daughter; Breast cancer in her paternal aunt; COPD in her father and mother; Cancer in her cousin and paternal aunt; Depression in her daughter; Diabetes in her cousin, father, mother, paternal grandmother, and sister; Heart attack (age of onset: 52) in her father; Heart disease in her father and mother; Tuberculosis in her maternal aunt.  Constitutional Exam  General appearance: Well nourished, well developed, and well hydrated. In no apparent acute distress Vitals:   03/21/17 1316  BP: 123/79  Pulse: (!) 102  Resp: 16  Temp: 97.8 F (36.6 C)  SpO2: 99%  Weight: 134 lb (60.8 kg)  Height: '5\' 1"'$  (1.549 m)   BMI Assessment: Estimated body mass index is 25.32 kg/m as calculated from the following:   Height as of this encounter: '5\' 1"'$  (1.549 m).   Weight as of this encounter: 134 lb (60.8 kg).  BMI interpretation table: BMI level Category Range association with higher incidence of chronic pain  <18 kg/m2 Underweight   18.5-24.9 kg/m2 Ideal body weight   25-29.9 kg/m2 Overweight Increased incidence by 20%  30-34.9 kg/m2 Obese  (Class I) Increased incidence by 68%  35-39.9 kg/m2 Severe obesity (Class II) Increased incidence by 136%  >40 kg/m2 Extreme obesity (Class III) Increased incidence by 254%   BMI Readings from Last 4 Encounters:  03/21/17 25.32 kg/m  02/20/17 25.70 kg/m  02/12/17 25.70 kg/m  02/06/17 25.74 kg/m   Wt Readings from Last 4 Encounters:  03/21/17 134 lb (60.8 kg)  02/20/17 136 lb (61.7 kg)  02/12/17 136 lb (61.7 kg)  02/06/17 136 lb 4 oz (61.8 kg)  Psych/Mental status: Alert, oriented x 3 (person, place, & time)       Eyes: PERLA Respiratory: No evidence of acute respiratory distress  Cervical Spine Area Exam  Skin & Axial Inspection: No masses, redness, edema, swelling, or associated skin lesions Alignment: Symmetrical Functional ROM: Unrestricted ROM      Stability: No instability detected Muscle Tone/Strength: Functionally intact. No obvious neuro-muscular anomalies detected. Sensory (Neurological): Unimpaired Palpation: No palpable anomalies              Upper Extremity (UE) Exam    Side: Right upper extremity  Side: Left upper extremity  Skin & Extremity Inspection: Skin color, temperature, and hair growth are WNL. No peripheral edema or cyanosis. No masses, redness, swelling, asymmetry, or associated skin lesions. No contractures.  Skin & Extremity Inspection: Skin color, temperature, and hair growth are WNL. No peripheral edema or cyanosis. No masses, redness, swelling, asymmetry, or associated skin lesions. No contractures.  Functional ROM: Unrestricted ROM          Functional ROM: Unrestricted ROM          Muscle Tone/Strength: Functionally intact. No obvious neuro-muscular anomalies detected.  Muscle Tone/Strength: Functionally intact. No obvious neuro-muscular anomalies detected.  Sensory (Neurological): Unimpaired          Sensory (Neurological): Unimpaired          Palpation: No palpable anomalies              Palpation: No palpable anomalies              Specialized  Test(s): Deferred  Specialized Test(s): Deferred          Thoracic Spine Area Exam  Skin & Axial Inspection: No masses, redness, or swelling Alignment: Symmetrical Functional ROM: Unrestricted ROM Stability: No instability detected Muscle Tone/Strength: Functionally intact. No obvious neuro-muscular anomalies detected. Sensory (Neurological): Unimpaired Muscle strength & Tone: No palpable anomalies  Lumbar Spine Area Exam  Skin & Axial Inspection: No masses, redness, or swelling Alignment: Symmetrical Functional ROM: Improved after treatment      Stability: No instability detected Muscle Tone/Strength: Functionally intact. No obvious neuro-muscular anomalies detected. Sensory (Neurological): Improved Palpation: No palpable anomalies       Provocative Tests: Lumbar Hyperextension and rotation test: Improved after treatment       Lumbar Lateral bending test: evaluation deferred today       Patrick's Maneuver: evaluation deferred today                    Gait & Posture Assessment  Ambulation: Unassisted Gait: Relatively normal for age and body habitus Posture: WNL   Lower Extremity Exam    Side: Right lower extremity  Side: Left lower extremity  Skin & Extremity Inspection: Skin color, temperature, and hair growth are WNL. No peripheral edema or cyanosis. No masses, redness, swelling, asymmetry, or associated skin lesions. No contractures.  Skin & Extremity Inspection: Skin color, temperature, and hair growth are WNL. No peripheral edema or cyanosis. No masses, redness, swelling, asymmetry, or associated skin lesions. No contractures.  Functional ROM: Unrestricted ROM          Functional ROM: Unrestricted ROM          Muscle Tone/Strength: Functionally intact. No obvious neuro-muscular anomalies detected.  Muscle Tone/Strength: Functionally intact. No obvious neuro-muscular anomalies detected.  Sensory (Neurological): Unimpaired  Sensory (Neurological): Unimpaired  Palpation:  No palpable anomalies  Palpation: No palpable anomalies   Assessment  Primary Diagnosis & Pertinent Problem List: The primary encounter diagnosis was Lumbar facet syndrome (Bilateral) (R>L). Diagnoses of Chronic low back pain (Primary Source of Pain) (Bilateral) (R>L), Chronic lower extremity pain (Secondary source of pain) (Right), and Chronic pain syndrome were also pertinent to this visit.  Status Diagnosis  Resolved Resolved Controlled 1. Lumbar facet syndrome (Bilateral) (R>L)   2. Chronic low back pain (Primary Source of Pain) (Bilateral) (R>L)   3. Chronic lower extremity pain (Secondary source of pain) (Right)   4. Chronic pain syndrome     Problems updated and reviewed during this visit: No problems updated. Plan of Care  Pharmacotherapy (Medications Ordered): No orders of the defined types were placed in this encounter.  New Prescriptions   No medications on file   Medications administered today: Wanda [name] had no medications administered during this visit.   Procedure Orders     LUMBAR FACET(MEDIAL BRANCH NERVE BLOCK) [facility] Lab Orders  No laboratory test(s) ordered today   Imaging Orders  No imaging studies ordered today   Referral Orders  No referral(s) requested today    Interventional management options: Planned, scheduled, and/or pending:   None at this time    Considering:   Diagnostic bilateral lumbar facet block #3  Possible bilateral lumbar facet RFA  Diagnostic (Midline) L1 to lumbar epidural steroid injection    Palliative PRN treatment(s):   Palliative bilateral versus left sided lumbar facet block    Provider-requested follow-up: Return if symptoms worsen or fail to improve, for PRN Procedure.  Future Appointments Date Time Provider [place]  04/10/2017 1:20 PM End, Harrell Gave, MD [id number]   Primary Care Physician: Gennette Pac, FNP Location: [facility] Note by: Gaspar Cola, MD Date: 03/21/2017; Time: 1:39 PM

## 2017-03-21 NOTE — Progress Notes (Signed)
Safety precautions to be maintained throughout the outpatient stay will include: orient to surroundings, keep bed in low position, maintain call bell within reach at all times, provide assistance with transfer out of bed and ambulation.  

## 2017-03-21 NOTE — Patient Instructions (Addendum)
____________________________________________________________________________________________  Preparing for Procedure with Sedation Instructions: . Oral Intake: Do not eat or drink anything for at least 8 hours prior to your procedure. . Transportation: Public transportation is not allowed. Bring an adult driver. The driver must be physically present in our waiting room before any procedure can be started. [place] Kitchen Physical Assistance: Bring an adult physically capable of assisting you, in the event you need help. This adult should keep you company at home for at least 6 hours after the procedure. . Blood Pressure Medicine: Take your blood pressure medicine with a sip of water the morning of the procedure. . Blood thinners:  . Diabetics on insulin: Notify the staff so that you can be scheduled 1st case in the morning. If your diabetes requires high dose insulin, take only  of your normal insulin dose the morning of the procedure and notify the staff that you have done so. . Preventing infections: Shower with an antibacterial soap the morning of your procedure. . Build-up your immune system: Take 1000 mg of Vitamin C with every meal (3 times a day) the day prior to your procedure. Marland Kitchen Antibiotics: Inform the staff if you have a condition or reason that requires you to take antibiotics before dental procedures. . Pregnancy: If you are pregnant, call and cancel the procedure. . Sickness: If you have a cold, fever, or any active infections, call and cancel the procedure. . Arrival: You must be in the facility at least 30 minutes prior to your scheduled procedure. . Children: Do not bring children with you. . Dress appropriately: Bring dark clothing that you would not mind if they get stained. . Valuables: Do not bring any jewelry or valuables. Procedure appointments are reserved for interventional treatments only. Marland Kitchen No Prescription Refills. . No medication changes will be discussed during procedure  appointments. . No disability issues will be discussed. ____________________________________________________________________________________________  Pain Management Discharge Instructions  General Discharge Instructions :  If you need to reach your doctor call: Monday-Friday 8:00 am - 4:00 pm at [phone] or toll free [phone].  After clinic hours [phone] to have operator reach doctor.  Bring all of your medication bottles to all your appointments in the pain clinic.  To cancel or reschedule your appointment with Pain Management please remember to call 24 hours in advance to avoid a fee.  Refer to the educational materials which you have been given on: General Risks, I had my Procedure. Discharge Instructions, Post Sedation.  Post Procedure Instructions:  The drugs you were given will stay in your system until tomorrow, so for the next 24 hours you should not drive, make any legal decisions or drink any alcoholic beverages.  You may eat anything you prefer, but it is better to start with liquids then soups and crackers, and gradually work up to solid foods.  Please notify your doctor immediately if you have any unusual bleeding, trouble breathing or pain that is not related to your normal pain.  Depending on the type of procedure that was done, some parts of your body may feel week and/or numb.  This usually clears up by tonight or the next day.  Walk with the use of an assistive device or accompanied by an adult for the 24 hours.  You may use ice on the affected area for the first 24 hours.  Put ice in a Ziploc bag and cover with a towel and place against area 15 minutes on 15 minutes off.  You may switch to heat after  24 hours. 

## 2017-03-26 DIAGNOSIS — F331 Major depressive disorder, recurrent, moderate: Secondary | ICD-10-CM | POA: Diagnosis not present

## 2017-03-26 DIAGNOSIS — R69 Illness, unspecified: Secondary | ICD-10-CM | POA: Diagnosis not present

## 2017-03-27 DIAGNOSIS — E2740 Unspecified adrenocortical insufficiency: Secondary | ICD-10-CM | POA: Diagnosis not present

## 2017-03-28 ENCOUNTER — Other Ambulatory Visit: Payer: Self-pay

## 2017-03-28 MED ORDER — POTASSIUM CHLORIDE CRYS ER 20 MEQ PO TBCR: 40.0000 meq | EXTENDED_RELEASE_TABLET | Freq: Every day | ORAL | 4 refills | Status: DC

## 2017-03-28 NOTE — Telephone Encounter (Signed)
Requested Prescriptions   Signed Prescriptions Disp Refills  . potassium chloride SA (K-DUR,KLOR-CON) 20 MEQ tablet 60 tablet 4    Sig: Take 2 tablets (40 mEq total) by mouth daily.    Authorizing Provider: END, CHRISTOPHER    Ordering User: Janan Ridge

## 2017-04-04 ENCOUNTER — Telehealth: Payer: Self-pay | Admitting: Internal Medicine

## 2017-04-04 NOTE — Telephone Encounter (Signed)
Called patient. Patient recently saw Dr Gabriel Carina. Potassium was discontinued yesterday and patient is currently doing 24 hour urine collection. Plan from Dr Gabriel Carina from 02/28/17: "Plan Causes of hypokalemia include medications, RTA, hyperaldosteronism, renal K losses and GI K losses. Cause not clear in this patient however will obtain labs to evaluated further to include initially repeat metB with aldosterone, renin, and cortisol levels. Will consider next obtaining a 24-hr urine potassium, although it is unlikely that she has a diagnosis of a potassium losing nephropathy. GI losses seem unlikely as well given chronicity and her complaints of constipation. She may have a nutritional deficiency, which we can explore and assess if intake of high potassium foods would be more beneficial. Will determine appropriate timing of follow up after initial labs are reviewed. "  BP and heartrates elevated: "12:15 pm 107/81 HR 149 12:30 pm 107/83 HR 139 12:45 pm114/73 HR 128 2:55  Pm  108/80 HR 122"  Patient took last dose of potassium yesterday. Today, when she got up she felt very "out of sorts." She's had dizziness all day even when laying down.  Denies chest pain and palpitations. Had her take BP/HR while on the phone 119/89, 114. A few minutes later BP 125/95, HR 126.   She is seeing her PCP tomorrow at [facility]. Next appointment with Dr End is 04/10/17. ROuting to Dr End for review.

## 2017-04-04 NOTE — Telephone Encounter (Signed)
Before ending call with patient. Her heart rate increased to the 120's and patient reports feeling dizzy. Advised patient to call 911 or go to the ER for evaluation and not to drive herself. She verbalized understanding of advice.

## 2017-04-04 NOTE — Telephone Encounter (Signed)
I agree with ED evaluation. Extensive w/u thus far has not yielded a cause fo rthe patient's orthostatic hypotension. I suggest that she proceed with endocrine w/u after being assessed in the ED.  Nelva Bush, MD [facility] Pager: [phone]

## 2017-04-04 NOTE — Telephone Encounter (Signed)
°  Pt c/o BP issue: STAT if pt c/o blurred vision, one-sided weakness or slurred speech  1. What are your last 5 BP readings?  12:15 pm 107/81 HR 149 12:30 pm 107/83 HR 139 12:45 pm114/73 HR 128 2:55  Pm  108/80 HR 122 2. Are you having any other symptoms (ex. Dizziness, headache, blurred vision, passed out)?  A bit dizzy today 3. What is your BP issue?  BP is high today

## 2017-04-05 NOTE — Telephone Encounter (Signed)
It does not appear pt went to [facility] ED yesterday as suggested. I called her this morning. She asks to call me back when she gets up.

## 2017-04-05 NOTE — Telephone Encounter (Signed)
Patient returning call.

## 2017-04-06 DIAGNOSIS — E876 Hypokalemia: Secondary | ICD-10-CM | POA: Diagnosis not present

## 2017-04-09 NOTE — Telephone Encounter (Signed)
Pt reports she went to bed instead of going to the ER as advised. Sx improved while laying down.  Appt with PCP the next day. No new medications ordered. Reports HR has been better controlled but is still often >100.  Confirmed 11/13 OV with Dr. Saunders Revel.

## 2017-04-10 ENCOUNTER — Encounter: Payer: Self-pay | Admitting: Internal Medicine

## 2017-04-10 ENCOUNTER — Ambulatory Visit (INDEPENDENT_AMBULATORY_CARE_PROVIDER_SITE_OTHER): Payer: Medicare HMO | Admitting: Internal Medicine

## 2017-04-10 ENCOUNTER — Telehealth: Payer: Self-pay | Admitting: Internal Medicine

## 2017-04-10 VITALS — BP 104/70 | HR 109 | Ht 61.0 in | Wt 140.8 lb

## 2017-04-10 DIAGNOSIS — R42 Dizziness and giddiness: Secondary | ICD-10-CM

## 2017-04-10 DIAGNOSIS — R000 Tachycardia, unspecified: Secondary | ICD-10-CM

## 2017-04-10 MED ORDER — SODIUM CHLORIDE 1 G PO TABS: 1.0000 g | ORAL_TABLET | Freq: Three times a day (TID) | ORAL | 3 refills | Status: DC

## 2017-04-10 NOTE — Patient Instructions (Addendum)
Medication Instructions:  Your physician has recommended you make the following change in your medication:  1- START taking Sodium chloride (salt tabs) 1 gram by mouth three times a day.   Labwork: none  Testing/Procedures: none  Follow-Up: You have been referred to Dr Caryl Comes for evaluation of POTS.  Your physician recommends that you schedule follow up in:   If you need a refill on your cardiac medications before your next appointment, please call your pharmacy.

## 2017-04-10 NOTE — Telephone Encounter (Signed)
Spoke with patient. She says someone from Dr [employer] office called her and said they were unable to get enough conclusive data from patient's 24 hour urine collection because she did not collect enough urine. They asked patient to repeat the test. Patient is reluctant to repeat test because it was a long process and she said she put all her urine in there. I encouraged patient to follow the advice of Dr Gabriel Carina. She verbalized understanding. She is awaiting for office to call her back and will call tomorrow if she has not heard anything else.

## 2017-04-10 NOTE — Telephone Encounter (Signed)
Please call pt regarding DR. Solum. She states she would like pt to repeat 24 hour urine test. Please call.

## 2017-04-10 NOTE — Progress Notes (Signed)
Follow-up Outpatient Visit Date: 04/10/2017  Primary Care Provider: Gennette Pac, [address] [place] [address]  Chief Complaint: Lightheadedness  HPI:  Wanda [name] is a 61 y.o. year-old female with history of hyperlipidemia and irregular heartbeat, who presents for follow-up of orthostatic hypotension. I last saw her in September, at which time she was awaiting endocrinology evaluation of possible adrenal insufficiency. Cosyntropin stimulation test returned normal. She is still awaiting the results of 24 hour urine potasscollection after stopping potassium supplementation.  Since our last visit, Wanda [name] reports that her lightheadedness has been about the same. She frequently notes rapid heart beats, up to almost 150 bpm. Her blood pressure is often normal when she is lightheaded, though her heart rate is typically quite high. She continues to wear compression stockings. She denies chest pain, shortness of breath, and edema. She almost fell a few nights ago while trying to sit down on the toilet. She felt lightheaded and lost her balance but was able to catch herself before falling. Her left thigh/knee pain has almost completely resolved now.   --------------------------------------------------------------------------------------------------  Cardiovascular History & Procedures: Cardiovascular Problems:  Orthostatic lightheadedness/fall   Risk Factors:  Hyperlipidemia  Cath/PCI:  None  CV Surgery:  None  EP Procedures and Devices:  Cardiac event monitor (06/28/16): Predominantly sinus rhythm with rare PACs and PVCs, as well as paroxysmal SVT (lasting up to 10.8 seconds).  Non-Invasive Evaluation(s):  Pharmacologic MPI (09/29/16): Low risk study without ischemia or scar. Nonspecific ST depression noted with regular dentist sent. LVEF 55-65%.  Bilateral carotid Doppler (05/31/16): Mild bilateral carotid atherosclerosis (<50%). Antegrade vertebral artery  flow bilaterally.  TTE (05/29/16): Technically difficult study. Normal LV size and function (LVEF 60-65%). No significant valvular abnormalities (though suboptimally visualized). Normal RV size and function.  Recent CV Pertinent Labs: Lab Results  Component Value Date   CHOL 206 (H) 09/29/2016   HDL 41 09/29/2016   LDLCALC 106 (H) 09/29/2016   LDLDIRECT 197.3 02/01/2010   TRIG 296 (H) 09/29/2016   CHOLHDL 5.0 09/29/2016   K 4.2 02/06/2017   K 3.8 12/04/2012   MG 2.2 02/06/2017   BUN 6 (L) 02/06/2017   BUN 16 12/04/2012   CREATININE 0.60 02/06/2017   CREATININE 0.64 12/04/2012    Past medical and surgical history were reviewed and updated in EPIC.  Current Meds  Medication Sig  . alendronate (FOSAMAX) 10 MG tablet Take 10 mg by mouth daily before breakfast. Take with a full glass of water on an empty stomach.  Marland Kitchen aspirin EC 81 MG tablet Take 81 mg by mouth daily.  Marland Kitchen atorvastatin (LIPITOR) 40 MG tablet Take 40 mg by mouth daily at 6 (six) AM.   . Cholecalciferol (VITAMIN D3) 5000 units TABS Take 5,000 Units by mouth daily.  . clonazePAM (KLONOPIN) 0.5 MG tablet Take 1/2 tablet 3 times a day  . docusate sodium (COLACE) 100 MG capsule Take 100 mg by mouth daily.  . Multiple Vitamin (MULTIVITAMIN) tablet Take 1 tablet by mouth daily.  . pantoprazole (PROTONIX) 40 MG tablet Take 40 mg by mouth 2 (two) times daily.   . polyethylene glycol (MIRALAX / GLYCOLAX) packet Take 17 g by mouth as needed.   Marland Kitchen QUEtiapine (SEROQUEL) 25 MG tablet Take 1-2 tablets by mouth at bedtime.  . senna (SENOKOT) 8.6 MG TABS tablet Take 1 tablet by mouth daily.  Marland Kitchen venlafaxine XR (EFFEXOR-XR) 75 MG 24 hr capsule Take 75 mg daily by mouth.    Allergies: Levofloxacin; Mobic [  meloxicam]; Propoxyphene hcl; Tramadol; and Codeine  Social History   Socioeconomic History  . Marital status: Single    Spouse name: Not on file  . Number of children: Not on file  . Years of education: Not on file  . Highest  education level: Not on file  Social Needs  . Financial resource strain: Not on file  . Food insecurity - worry: Not on file  . Food insecurity - inability: Not on file  . Transportation needs - medical: Not on file  . Transportation needs - non-medical: Not on file  Occupational History  . Not on file  Tobacco Use  . Smoking status: Former Smoker    Packs/day: 0.25    Years: 14.00    Pack years: 3.50    Types: Cigarettes    Last attempt to quit: 1992    Years since quitting: 26.8  . Smokeless tobacco: Former Systems developer    Quit date: 08/29/1990  Substance and Sexual Activity  . Alcohol use: Yes    Comment: occasional; once every 6 months  . Drug use: No  . Sexual activity: Yes    Birth control/protection: Surgical  Other Topics Concern  . Not on file  Social History Narrative  . Not on file    Family History  Problem Relation Age of Onset  . Diabetes Mother   . COPD Mother   . Heart disease Mother   . Diabetes Father   . COPD Father   . Heart disease Father   . Heart attack Father 74       CABG  . Alcohol abuse Daughter   . Depression Daughter   . Tuberculosis Maternal Aunt   . Cancer Paternal Aunt        breast  . Breast cancer Paternal Aunt   . Diabetes Paternal Grandmother   . Diabetes Sister   . Cancer Cousin   . Diabetes Cousin   . Colon cancer Neg Hx   . Sudden Cardiac Death Neg Hx     Review of Systems: A 12-system review of systems was performed and was negative except as noted in the HPI.  --------------------------------------------------------------------------------------------------  Physical Exam: BP 104/70 (BP Location: Left Arm, Patient Position: Sitting, Cuff Size: Normal)   Pulse (!) 109   Ht 5\' 1"  (1.549 m)   Wt 140 lb 12 oz (63.8 kg)   LMP 09/30/1991 (Approximate) Comment: 1993  BMI 26.59 kg/m   General:  Overweight woman, seated comfortably in the exam room HEENT: No conjunctival pallor or scleral icterus. Moist mucous membranes.  OP  clear. Neck: Supple without lymphadenopathy, thyromegaly, JVD, or HJR. Lungs: Normal work of breathing. Clear to auscultation bilaterally without wheezes or crackles. Heart: Regular rate and rhythm without murmurs, rubs, or gallops. Non-displaced PMI. Abd: Bowel sounds present. Soft, NT/ND without hepatosplenomegaly Ext: No lower extremity edema. Compression stockings in place. Skin: Warm and dry without rash.  EKG:  Sinus tachycardia (HR 109 bpm). Otherwise, no significant abnormalities.  Lab Results  Component Value Date   WBC 5.6 02/06/2017   HGB 12.9 02/06/2017   HCT 38.4 02/06/2017   MCV 97 02/06/2017   PLT 232 02/06/2017    Lab Results  Component Value Date   NA 143 02/06/2017   K 4.2 02/06/2017   CL 105 02/06/2017   CO2 22 02/06/2017   BUN 6 (L) 02/06/2017   CREATININE 0.60 02/06/2017   GLUCOSE 104 (H) 02/06/2017   ALT 29 08/09/2016    Lab Results  Component  Value Date   CHOL 206 (H) 09/29/2016   HDL 41 09/29/2016   LDLCALC 106 (H) 09/29/2016   LDLDIRECT 197.3 02/01/2010   TRIG 296 (H) 09/29/2016   CHOLHDL 5.0 09/29/2016    --------------------------------------------------------------------------------------------------  ASSESSMENT AND PLAN: Sinus tachycardia and orthostatic lightheadedness This has been a chronic problem for Wanda [name] and precipitated her fall and left femur fracture in January. Extensive cardiac, renal, and endocrine workup has been unrevealing thus far. I have encouraged her to continue to stay well-hydrated. I have instructed her to begin taking salt tablets (sodium chloride 1000 mg TID) and to continue with compression stockings. I will refer Ms. Don to Dr. Caryl Comes for further evaluation of potential POTS.  Follow-up: Return to clinic in 3 months.  Nelva Bush, MD 04/11/2017 6:56 PM

## 2017-04-11 ENCOUNTER — Encounter: Payer: Self-pay | Admitting: Internal Medicine

## 2017-04-11 DIAGNOSIS — R000 Tachycardia, unspecified: Secondary | ICD-10-CM | POA: Insufficient documentation

## 2017-04-11 HISTORY — DX: Tachycardia, unspecified: R00.0

## 2017-04-16 DIAGNOSIS — E876 Hypokalemia: Secondary | ICD-10-CM | POA: Diagnosis not present

## 2017-04-26 ENCOUNTER — Ambulatory Visit (INDEPENDENT_AMBULATORY_CARE_PROVIDER_SITE_OTHER): Payer: Medicare HMO | Admitting: Internal Medicine

## 2017-04-26 ENCOUNTER — Encounter: Payer: Self-pay | Admitting: Internal Medicine

## 2017-04-26 VITALS — BP 119/85 | HR 89 | Ht 61.0 in | Wt 141.2 lb

## 2017-04-26 DIAGNOSIS — R42 Dizziness and giddiness: Secondary | ICD-10-CM | POA: Diagnosis not present

## 2017-04-26 DIAGNOSIS — R000 Tachycardia, unspecified: Secondary | ICD-10-CM

## 2017-04-26 DIAGNOSIS — I471 Supraventricular tachycardia: Secondary | ICD-10-CM

## 2017-04-26 NOTE — Patient Instructions (Signed)
Medication Instructions: Your physician recommends that you continue on your current medications as directed. Please refer to the Current Medication list given to you today.  Labwork: None Ordered  Procedures/Testing: None Ordered  Follow-Up: Your physician wants you to follow-up in as needed with Dr. Caryl Comes.   If you need a refill on your cardiac medications before your next appointment, please call your pharmacy.

## 2017-04-26 NOTE — Progress Notes (Signed)
ELECTROPHYSIOLOGY CONSULT NOTE  Patient ID: Wanda [name], MRN: [id number], DOB/AGE: July 23, 1955 61 y.o. Admit date: (Not on file) Date of Consult: 04/26/2017  Primary Physician: Gennette Pac, Home Primary Cardiologist: CE     Wanda [name] is a 61 y.o. female who is being seen today for the evaluation of tachycardia at the request of Dr End.    HPI Wanda [name] is a 61 y.o. female  Who without antecedent symptoms of LH or Orthostatic intolerance awakened one am in Jan, stood and fell breaking her femur. This occurred in the context of a UTI assoc with urgency  The postoperative course was notable for labile blood pressure and persistent orthostatic hypotension despite discontinuation of nadolol and prazosin.  She was maintained on seroquel; her zoloft was changed to venlafaxine.    Effort in treatment have included salt and water,  florinef following proamatine which even at doses of 10 mg tid had no appreciable benefit  She has continued with orthostatic intolerance although without recurrent syncope.  She has persistent sinus tachycardia  She also has significant constipation and some dry mouth and eyes, although urinary hesitancy is not evident.    She has life long hyperhydrosis   She had chronic pain with the implantation of a neural/spinalstimulator   Non-Invasive Evaluation(s):  Pharmacologic MPI (09/29/16): [name] risk study without ischemia or scar. Nonspecific ST depression noted with regular dentist sent. LVEF 55-65%.  Bilateral carotid Doppler (05/31/16): Mild bilateral carotid atherosclerosis (<50%). Antegrade vertebral artery flow bilaterally.  TTE (05/29/16): Technically difficult study. Normal LV size and function (LVEF 60-65%). No significant valvular abnormalities (though suboptimally visualized). Normal RV size and function.   Past Medical History:  Diagnosis Date  . Anxiety   . Borderline diabetes   . BRONCHITIS, ACUTE WITH BRONCHOSPASM 06/25/2010    Qualifier: Diagnosis of  By: Alveta Heimlich MD, Cornelia Copa    . Depression   . GASTROENTERITIS WITHOUT DEHYDRATION 05/14/2009   Qualifier: Diagnosis of  By: Deborra Medina MD, Tanja Port    . GERD (gastroesophageal reflux disease)   . Herpes   . 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 Hyperlipidemia   . Irregular heart beat       Surgical History:  Past Surgical History:  Procedure Laterality Date  . ABDOMINAL HYSTERECTOMY    . APPENDECTOMY    . BACK SURGERY     X 3  . BREAST EXCISIONAL BIOPSY Right    surgical bx age 52   . COLONOSCOPY N/A 08/28/2013   Procedure: COLONOSCOPY;  Surgeon: Danie Binder, MD;  Location: [facility];  Service: Endoscopy;  Laterality: N/A;  8:30 AM  . INTRAMEDULLARY (IM) NAIL INTERTROCHANTERIC Left 05/30/2016   Procedure: INTRAMEDULLARY (IM) NAIL INTERTROCHANTRIC;  Surgeon: Thornton Park, MD;  Location: [facility] ORS;  Service: Orthopedics;  Laterality: Left;  . SHOULDER SURGERY    . Spinal cord stimulator       Home Meds: Prior to Admission medications   Medication Sig Start Date End Date Taking? Authorizing Provider  alendronate (FOSAMAX) 10 MG tablet Take 10 mg by mouth daily before breakfast. Take with a full glass of water on an empty stomach.   Yes [provider]  aspirin EC 81 MG tablet Take 81 mg by mouth daily.   Yes [provider]  atorvastatin (LIPITOR) 40 MG tablet Take 40 mg by mouth daily at 6 (six) AM.    Yes [provider]  Cholecalciferol (VITAMIN D3) 5000 units  TABS Take 5,000 Units by mouth daily.   Yes [provider]  clonazePAM (KLONOPIN) 0.5 MG tablet Take 1/2 tablet 3 times a day 03/20/16  Yes [provider]  docusate sodium (COLACE) 100 MG capsule Take 100 mg by mouth daily.   Yes [provider]  Multiple Vitamin (MULTIVITAMIN) tablet Take 1 tablet by mouth daily.   Yes [provider]  pantoprazole (PROTONIX) 40 MG tablet Take 40 mg by mouth 2 (two) times  daily.    Yes [provider]  polyethylene glycol (MIRALAX / GLYCOLAX) packet Take 17 g by mouth as needed.    Yes [provider]  potassium chloride SA (K-DUR,KLOR-CON) 20 MEQ tablet Take 2 tablets (40 mEq total) by mouth daily. 03/28/17  Yes End, Harrell Gave, MD  QUEtiapine (SEROQUEL) 25 MG tablet Take 1-2 tablets by mouth at bedtime. 05/05/16  Yes [provider]  senna (SENOKOT) 8.6 MG TABS tablet Take 1 tablet by mouth daily.   Yes [provider]  venlafaxine XR (EFFEXOR-XR) 75 MG 24 hr capsule Take 75 mg daily by mouth. 03/26/17  Yes [provider]    Allergies:  Allergies  Allergen Reactions  . Levofloxacin     REACTION: N \\T \ V  . Mobic [Meloxicam] Other (See Comments)    Calf tightness  . Propoxyphene Hcl     REACTION: N \\T \ V  . Tramadol Other (See Comments)    Severe headache  . Codeine Nausea Only    Slight nausea    Social History   Socioeconomic History  . Marital status: Single    Spouse name: Not on file  . Number of children: Not on file  . Years of education: Not on file  . Highest education level: Not on file  Social Needs  . Financial resource strain: Not on file  . Food insecurity - worry: Not on file  . Food insecurity - inability: Not on file  . Transportation needs - medical: Not on file  . Transportation needs - non-medical: Not on file  Occupational History  . Not on file  Tobacco Use  . Smoking status: Former Smoker    Packs/day: 0.25    Years: 14.00    Pack years: 3.50    Types: Cigarettes    Last attempt to quit: 1992    Years since quitting: 26.9  . Smokeless tobacco: Former Systems developer    Quit date: 08/29/1990  Substance and Sexual Activity  . Alcohol use: Yes    Comment: occasional; once every 6 months  . Drug use: No  . Sexual activity: Yes    Birth control/protection: Surgical  Other Topics Concern  . Not on file  Social History Narrative  . Not on file     Family History  Problem  Relation Age of Onset  . Diabetes Mother   . COPD Mother   . Heart disease Mother   . Diabetes Father   . COPD Father   . Heart disease Father   . Heart attack Father 26       CABG  . Alcohol abuse Daughter   . Depression Daughter   . Tuberculosis Maternal Aunt   . Cancer Paternal Aunt        breast  . Breast cancer Paternal Aunt   . Diabetes Paternal Grandmother   . Diabetes Sister   . Cancer Cousin   . Diabetes Cousin   . Colon cancer Neg Hx   . Sudden Cardiac Death Neg Hx  ROS:  Please see the history of present illness.     All other systems reviewed and negative.    Physical Exam:  Blood pressure 119/85, pulse 89, height 5\' 1"  (1.549 m), weight 141 lb 4 oz (64.1 kg), last menstrual period 09/30/1991. General: Well developed, well nourished female in no acute distress. Head: Normocephalic, atraumatic, sclera non-icteric, no xanthomas, nares are without discharge. EENT: normal  Lymph Nodes:  none Neck: Negative for carotid bruits. JVD not elevated. Back:without scoliosis kyphosis Lungs: Clear bilaterally to auscultation without wheezes, rales, or rhonchi. Breathing is unlabored. Heart: RRR with S1 S2. No  murmur . No rubs, or gallops appreciated. Abdomen: Soft, non-tender, non-distended with normoactive bowel sounds. No hepatomegaly. No rebound/guarding. No obvious abdominal masses. Msk:  Strength and tone appear normal for age. Extremities: No clubbing or cyanosis. No edema.  Distal pedal pulses are 2+ and equal bilaterally. Skin: Warm and Dry Neuro: Alert and oriented X 3. CN III-XII intact Grossly normal sensory and motor function . Psych:  Responds to questions appropriately with a normal affect.      Labs: Cardiac Enzymes No results for input(s): CKTOTAL, CKMB, TROPONINI in the last 72 hours. CBC Lab Results  Component Value Date   WBC 5.6 02/06/2017   HGB 12.9 02/06/2017   HCT 38.4 02/06/2017   MCV 97 02/06/2017   PLT 232 02/06/2017   PROTIME: No  results for input(s): LABPROT, INR in the last 72 hours. Chemistry No results for input(s): NA, K, CL, CO2, BUN, CREATININE, CALCIUM, PROT, BILITOT, ALKPHOS, ALT, AST, GLUCOSE in the last 168 hours.  Invalid input(s): LABALBU Lipids Lab Results  Component Value Date   CHOL 206 (H) 09/29/2016   HDL 41 09/29/2016   LDLCALC 106 (H) 09/29/2016   TRIG 296 (H) 09/29/2016   BNP No results found for: PROBNP Thyroid Function Tests: No results for input(s): TSH, T4TOTAL, T3FREE, THYROIDAB in the last 72 hours.  Invalid input(s): FREET3 Miscellaneous No results found for: DDIMER  Radiology/Studies:  No results found.  EKG: sinus 89 13/07/35   Assessment and Plan:   Orthostatic Hypotension  Constipation  Hyperhidrosis  Hypokalemia  Rash   The patient had the abrupt onset of orthostatic intolerance.  She denied an antecedent viral illness to suggest a post viral mechanism.  The constipation with dry mouth antedate the orthostasis raising the possibility of a systemic autonomic syndrome.  In this regard, her autonomic innervation of her heart appears to be normal with a an appropriate RR variability with inspiration  I do not know whether the hypokalemia relates to this or not.  Potassium wasting syndromes can be associated with electrolyte loss and hence intravascular depletion.  Measurements of urinary potassium were within the normal range.  I am not sufficiently educated to know for specificity of 24-hour urine potassium measurements in terms of excluding a potassium wasting syndrome.  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  We will need to discuss with nephrology their evaluation  It is hard to imagine her hyperhidrosis could be sufficiently problematic to contribute to orthostasis.  I do remember a patient being treated with anticholinergic therapy, and gave her the name of Ditropan, her PCP might be able to utilize to attenuate what has really  been a life disruptive symptom complex.  It can aggravate dry eyes and dry mouth however.  Yuck.  Furthermore, the rash that she has developed the also be a clue to what ever is currently ongoing.  It  is not itching and so does not support a diagnosis of mastocytosis.    A brief literature review does not identify orthostatic hypotension as a consequence of spinal cord stimulation.  Indeed in spinal cord injury patients  spinal stimulation can mitigate orthostasis.  Another literature search demonstrates a number of articles and quoted incidence of 0.4% of seroquel causing hypotension related to its alpha antagonism       Virl Axe

## 2017-04-27 LAB — BASIC METABOLIC PANEL
BUN/Creatinine Ratio: 14 (ref 12–28)
BUN: 9 mg/dL (ref 8–27)
CALCIUM: 9.5 mg/dL (ref 8.7–10.3)
CO2: 24 mmol/L (ref 20–29)
CREATININE: 0.65 mg/dL (ref 0.57–1.00)
Chloride: 101 mmol/L (ref 96–106)
GFR, EST AFRICAN AMERICAN: 111 mL/min/{1.73_m2} (ref >59)
GFR, EST NON AFRICAN AMERICAN: 96 mL/min/{1.73_m2} (ref >59)
Glucose: 87 mg/dL (ref 65–99)
POTASSIUM: 4.1 mmol/L (ref 3.5–5.2)
Sodium: 140 mmol/L (ref 134–144)

## 2017-05-01 ENCOUNTER — Telehealth: Payer: Self-pay

## 2017-05-01 NOTE — Telephone Encounter (Signed)
-----   Message from Deboraha Sprang, MD sent at 04/29/2017 11:50 AM EST ----- Please Inform Patient that labs are normal   Thanks

## 2017-05-01 NOTE — Telephone Encounter (Signed)
lmtcb for lab results

## 2017-05-04 NOTE — Telephone Encounter (Deleted)
-----   Message from Deboraha Sprang, MD sent at 04/29/2017 11:50 AM EST ----- Please Inform Patient that labs are normal   Thanks

## 2017-05-04 NOTE — Telephone Encounter (Signed)
lmtcb for lab results

## 2017-05-30 DIAGNOSIS — E663 Overweight: Secondary | ICD-10-CM | POA: Insufficient documentation

## 2017-06-25 ENCOUNTER — Other Ambulatory Visit: Payer: Self-pay

## 2017-06-25 ENCOUNTER — Encounter: Payer: Self-pay | Admitting: Psychiatry

## 2017-06-25 ENCOUNTER — Ambulatory Visit: Payer: Medicare HMO | Admitting: Psychiatry

## 2017-06-25 VITALS — BP 133/91 | HR 116 | Temp 97.8°F | Wt 152.0 lb

## 2017-06-25 DIAGNOSIS — F331 Major depressive disorder, recurrent, moderate: Secondary | ICD-10-CM | POA: Diagnosis not present

## 2017-06-25 DIAGNOSIS — F411 Generalized anxiety disorder: Secondary | ICD-10-CM

## 2017-06-25 DIAGNOSIS — F4010 Social phobia, unspecified: Secondary | ICD-10-CM | POA: Diagnosis not present

## 2017-06-25 MED ORDER — HYDROXYZINE PAMOATE 25 MG PO CAPS
25.0000 mg | ORAL_CAPSULE | Freq: Every evening | ORAL | 1 refills | Status: DC | PRN
Start: 2017-06-25 — End: 2017-06-25

## 2017-06-25 MED ORDER — VILAZODONE HCL 20 MG PO TABS: 10.0000 mg | ORAL_TABLET | ORAL | 0 refills | Status: DC
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
## 2017-06-25 NOTE — Patient Instructions (Signed)
Vilazodone oral tablet What is this medicine? VILAZODONE (vil [place] oh done) is used to treat depression. This medicine may be used for other purposes; ask your health care provider or pharmacist if you have questions. COMMON BRAND NAME(S): [id number] What should I tell my health care provider before I take this medicine? They need to know if you have any of these conditions: -bipolar disorder or a family history of bipolar disorder -glaucoma -liver disease -low levels of sodium in the blood -receiving electroconvulsive therapy -seizures (convulsions) -suicidal thoughts, plans, or attempt by you or a family member -an unusual or allergic reaction to vilazodone, other medicines, foods, dyes or preservatives -pregnant or trying to get pregnant -breast-feeding How should I use this medicine? Take this medicine by mouth with a glass of water. Follow the directions on the prescription label. Take this medicine with food. Take your medicine at regular intervals. Do not take your medicine more often than directed. Do not stop taking this medicine suddenly except upon the advice of your doctor. Stopping this medicine too quickly may cause serious side effects or your condition may worsen. A special MedGuide will be given to you by the pharmacist with each prescription and refill. Be sure to read this information carefully each time. Overdosage: If you think you have taken too much of this medicine contact a poison control center or emergency room at once. NOTE: This medicine is only for you. Do not share this medicine with others. What if I miss a dose? If you miss a dose, take it as soon as you can. If it is almost time for your next dose, take only that dose. Do not take double or extra doses. What may interact with this medicine? Do not take this medicine with any of the following medications: -linezolid -MAOIs like Carbex, Eldepryl, Marplan, Nardil, and Parnate -methylene blue (injected into a  vein) This medicine may also interact with the following medications: -amphetamines -aspirin and aspirin-like medicines -buspirone -certain diet drugs like dexfenfluramine, fenfluramine, phentermine, sibutramine -certain migraine headache medicines like almotriptan, eletriptan, frovatriptan, naratriptan, rizatriptan, sumatriptan, zolmitriptan -certain medicines that treat or prevent blood clots like warfarin, enoxaparin, and dalteparin -certain medicines that treat infections like clarithromycin, itraconazole, voriconazole, ketoconazole, rifampin -certain medicines that treat seizures like carbamazepine and phenytoin -digoxin -fentanyl -lithium -NSAIDS, medicines for pain and inflammation, like ibuprofen or naproxen -other medicines for depression, anxiety, or psychotic disturbances -St. John's Wort -tramadol -tryptophan This list may not describe all possible interactions. Give your health care provider a list of all the medicines, herbs, non-prescription drugs, or dietary supplements you use. Also tell them if you smoke, drink alcohol, or use illegal drugs. Some items may interact with your medicine. What should I watch for while using this medicine? Tell your doctor if your symptoms do not get better or if they get worse. Visit your doctor or health care professional for regular checks on your progress. Because it may take several weeks to see the full effects of this medicine, it is important to continue your treatment as prescribed by your doctor. Patients and their families should watch out for new or worsening thoughts of suicide or depression. Also watch out for sudden changes in feelings such as feeling anxious, agitated, panicky, irritable, hostile, aggressive, impulsive, severely restless, overly excited and hyperactive, or not being able to sleep. If this happens, especially at the beginning of treatment or after a change in dose, call your health care professional. You may get  drowsy or dizzy. Do   not drive, use machinery, or do anything that needs mental alertness until you know how this medicine affects you. Do not stand or sit up quickly, especially if you are an older patient. This reduces the risk of dizzy or fainting spells. Alcohol may interfere with the effect of this medicine. Avoid alcoholic drinks. Your mouth may get dry. Chewing sugarless gum or sucking hard candy, and drinking plenty of water may help. Contact your doctor if the problem does not go away or is severe. What side effects may I notice from receiving this medicine? Side effects that you should report to your doctor or health care professional as soon as possible: -allergic reactions like skin rash, itching or hives, swelling of the face, lips, or tongue -anxious -black, tarry stools -changes in vision -confusion -elevated mood, decreased need for sleep, racing thoughts, impulsive behavior -eye pain -fast, irregular heartbeat -feeling faint or lightheaded, falls -feeling agitated, angry, or irritable -hallucination, loss of contact with reality -loss of balance or coordination -loss of memory -restlessness, pacing, inability to keep still -seizures -stiff muscles -suicidal thoughts or other mood changes -trouble sleeping -unusual bleeding or bruising -unusually weak or tired -vomiting Side effects that usually do not require medical attention (report to your doctor or health care professional if they continue or are bothersome): -change in appetite or weight -change in sex drive or performance -diarrhea -drowsiness -dry mouth -increased sweating -nausea -tremors This list may not describe all possible side effects. Call your doctor for medical advice about side effects. You may report side effects to FDA at 1-800-FDA-1088. Where should I keep my medicine? Keep out of the reach of children. Store at room temperature between 15 and 30 degrees C (59 to 86 degrees F). Throw away any  unused medicine after the expiration date. NOTE: This sheet is a summary. It may not cover all possible information. If you have questions about this medicine, talk to your doctor, pharmacist, or health care provider.  2018 [employer] (2016-01-11 12:50:48) Hydroxyzine capsules or tablets What is this medicine? HYDROXYZINE (hye Wyano i zeen) is an antihistamine. This medicine is used to treat allergy symptoms. It is also used to treat anxiety and tension. This medicine can be used with other medicines to induce sleep before surgery. This medicine may be used for other purposes; ask your health care provider or pharmacist if you have questions. COMMON BRAND NAME(S): ANX, Atarax, Rezine, Vistaril What should I tell my health care provider before I take this medicine? They need to know if you have any of these conditions: -any chronic illness -difficulty passing urine -glaucoma -heart disease -kidney disease -liver disease -lung disease -an unusual or allergic reaction to hydroxyzine, cetirizine, other medicines, foods, dyes, or preservatives -pregnant or trying to get pregnant -breast-feeding How should I use this medicine? Take this medicine by mouth with a full glass of water. Follow the directions on the prescription label. You may take this medicine with food or on an empty stomach. Take your medicine at regular intervals. Do not take your medicine more often than directed. Talk to your pediatrician regarding the use of this medicine in children. Special care may be needed. While this drug may be prescribed for children as young as 51 years of age for selected conditions, precautions do apply. Patients over 44 years old may have a stronger reaction and need a smaller dose. Overdosage: If you think you have taken too much of this medicine contact a poison control center or emergency room at  once. NOTE: This medicine is only for you. Do not share this medicine with others. What if  I miss a dose? If you miss a dose, take it as soon as you can. If it is almost time for your next dose, take only that dose. Do not take double or extra doses. What may interact with this medicine? -alcohol -barbiturate medicines for sleep or seizures -medicines for colds, allergies -medicines for depression, anxiety, or emotional disturbances -medicines for pain -medicines for sleep -muscle relaxants This list may not describe all possible interactions. Give your health care provider a list of all the medicines, herbs, non-prescription drugs, or dietary supplements you use. Also tell them if you smoke, drink alcohol, or use illegal drugs. Some items may interact with your medicine. What should I watch for while using this medicine? Tell your doctor or health care professional if your symptoms do not improve. You may get drowsy or dizzy. Do not drive, use machinery, or do anything that needs mental alertness until you know how this medicine affects you. Do not stand or sit up quickly, especially if you are an older patient. This reduces the risk of dizzy or fainting spells. Alcohol may interfere with the effect of this medicine. Avoid alcoholic drinks. Your mouth may get dry. Chewing sugarless gum or sucking hard candy, and drinking plenty of water may help. Contact your doctor if the problem does not go away or is severe. This medicine may cause dry eyes and blurred vision. If you wear contact lenses you may feel some discomfort. Lubricating drops may help. See your eye doctor if the problem does not go away or is severe. If you are receiving skin tests for allergies, tell your doctor you are using this medicine. What side effects may I notice from receiving this medicine? Side effects that you should report to your doctor or health care professional as soon as possible: -fast or irregular heartbeat -difficulty passing urine -seizures -slurred speech or confusion -tremor Side effects that  usually do not require medical attention (report to your doctor or health care professional if they continue or are bothersome): -constipation -drowsiness -fatigue -headache -stomach upset This list may not describe all possible side effects. Call your doctor for medical advice about side effects. You may report side effects to FDA at 1-800-FDA-1088. Where should I keep my medicine? Keep out of the reach of children. Store at room temperature between 15 and 30 degrees C (59 and 86 degrees F). Keep container tightly closed. Throw away any unused medicine after the expiration date. NOTE: This sheet is a summary. It may not cover all possible information. If you have questions about this medicine, talk to your doctor, pharmacist, or health care provider.  2018 [employer] Standard (2007-09-27 14:50:59)

## 2017-06-28 ENCOUNTER — Ambulatory Visit
Admission: RE | Admit: 2017-06-28 | Discharge: 2017-06-28 | Disposition: A | Discharge status: Home / Self Care | Payer: Medicare HMO | Source: Ambulatory Visit | Attending: Pain Medicine | Admitting: Pain Medicine

## 2017-06-28 ENCOUNTER — Other Ambulatory Visit: Payer: Self-pay

## 2017-06-28 ENCOUNTER — Encounter: Payer: Self-pay | Admitting: Pain Medicine

## 2017-06-28 ENCOUNTER — Ambulatory Visit (HOSPITAL_BASED_OUTPATIENT_CLINIC_OR_DEPARTMENT_OTHER): Payer: Medicare HMO | Admitting: Pain Medicine

## 2017-06-28 VITALS — BP 116/85 | HR 90 | Temp 97.9°F | Resp 16 | Ht 61.0 in | Wt 151.0 lb

## 2017-06-28 DIAGNOSIS — M545 Low back pain, unspecified: Secondary | ICD-10-CM

## 2017-06-28 DIAGNOSIS — G8929 Other chronic pain: Secondary | ICD-10-CM | POA: Diagnosis not present

## 2017-06-28 DIAGNOSIS — M47816 Spondylosis without myelopathy or radiculopathy, lumbar region: Secondary | ICD-10-CM

## 2017-06-28 MED ORDER — LIDOCAINE HCL 2 % IJ SOLN
10.0000 mL | Freq: Once | INTRAMUSCULAR | Status: AC
  Administered 2017-06-28: 400 mg

## 2017-06-28 MED ORDER — ROPIVACAINE HCL 2 MG/ML IJ SOLN
9.0000 mL | Freq: Once | INTRAMUSCULAR | Status: AC
  Administered 2017-06-28: 9 mL via PERINEURAL

## 2017-06-28 MED ORDER — TRIAMCINOLONE ACETONIDE 40 MG/ML IJ SUSP
40.0000 mg | Freq: Once | INTRAMUSCULAR | Status: AC
  Administered 2017-06-28: 40 mg

## 2017-06-28 MED ORDER — ROPIVACAINE HCL 2 MG/ML IJ SOLN: 9.0000 mL | Freq: Once | INTRAMUSCULAR | Status: DC

## 2017-06-28 NOTE — Patient Instructions (Signed)

## 2017-06-28 NOTE — Progress Notes (Signed)
Patient's Name: Wanda [name]  MRN: [id number]  Referring Provider: Gennette Pac, FNP  DOB: [birth year]  PCP: Gennette Pac, FNP  DOS: 06/28/2017  Note by: Gaspar Cola, MD  Service setting: Ambulatory outpatient  Specialty: Interventional Pain Management  Patient type: Established  Location: [facility] (AMB) Pain Management Facility  Visit type: Interventional Procedure   Primary Reason for Visit: Interventional Pain Management Treatment. CC: Back Pain (low)  Procedure:  Anesthesia, Analgesia, Anxiolysis:  Type: Diagnostic Medial Branch Facet Block  #3  Region: Lumbar Level: L2, L3, L4, L5, & S1 Medial Branch Level(s) Laterality: Bilateral  Type: Local Anesthesia Local Anesthetic: Lidocaine 1% Route: Infiltration (Grand/IM) IV Access: Declined Sedation: Declined  Indication(s): Analgesia           Indications: 1. Lumbar facet syndrome (Bilateral) (R>L)   2. Lumbar facet arthropathy   3. Chronic low back pain (Primary Source of Pain) (Bilateral) (R>L)   4. Lumbar spondylosis   5. Facet syndrome, lumbar   6. Chronic bilateral low back pain without sciatica    Pain Score: Pre-procedure: 8 /10 Post-procedure: 4 /10  Pre-op Assessment:  Wanda [name] is a 62 y.o. (year old), female patient, seen today for interventional treatment. She  has a past surgical history that includes Shoulder surgery; Appendectomy; Abdominal hysterectomy; Back surgery; Spinal cord stimulator; Colonoscopy (N/A, 08/28/2013); Breast excisional biopsy (Right); and Intramedullary (im) nail intertrochanteric (Left, 05/30/2016). Wanda [name] has a current medication list which includes the following prescription(s): alendronate, aspirin ec, atorvastatin, vitamin d3, docusate sodium, multivitamin, pantoprazole, polyethylene glycol, quetiapine, clonazepam, hydroxyzine, and viibryd, and the following Facility-Administered Medications: ropivacaine (pf) 2 mg/ml (0.2%). Her primarily concern today is the Back Pain  (low)  Initial Vital Signs:  Pulse Rate: 90 Temp: 97.9 F (36.6 C) Resp: 16 BP: 126/81 SpO2: 100 %  BMI: Estimated body mass index is 28.53 kg/m as calculated from the following:   Height as of this encounter: 5\' 1"  (1.549 m).   Weight as of this encounter: 151 lb (68.5 kg).  Risk Assessment: Allergies: Reviewed. She is allergic to levofloxacin; mobic [meloxicam]; propoxyphene hcl; tramadol; and codeine.  Allergy Precautions: None required Coagulopathies: Reviewed. None identified.  Blood-thinner therapy: None at this time Active Infection(s): Reviewed. None identified. Ms. Reder is afebrile  Site Confirmation: Ms. Stallsmith was asked to confirm the procedure and laterality before marking the site Procedure checklist: Completed Consent: Before the procedure and under the influence of no sedative(s), amnesic(s), or anxiolytics, the patient was informed of the treatment options, risks and possible complications. To fulfill our ethical and legal obligations, as recommended by the American Medical Association's Code of Ethics, I have informed the patient of my clinical impression; the nature and purpose of the treatment or procedure; the risks, benefits, and possible complications of the intervention; the alternatives, including doing nothing; the risk(s) and benefit(s) of the alternative treatment(s) or procedure(s); and the risk(s) and benefit(s) of doing nothing. The patient was provided information about the general risks and possible complications associated with the procedure. These may include, but are not limited to: failure to achieve desired goals, infection, bleeding, organ or nerve damage, allergic reactions, paralysis, and death. In addition, the patient was informed of those risks and complications associated to Spine-related procedures, such as failure to decrease pain; infection (i.e.: Meningitis, epidural or intraspinal abscess); bleeding (i.e.: epidural hematoma,  subarachnoid hemorrhage, or any other type of intraspinal or peri-dural bleeding); organ or nerve damage (i.e.: Any type of peripheral nerve, nerve root, or spinal cord  injury) with subsequent damage to sensory, motor, and/or autonomic systems, resulting in permanent pain, numbness, and/or weakness of one or several areas of the body; allergic reactions; (i.e.: anaphylactic reaction); and/or death. Furthermore, the patient was informed of those risks and complications associated with the medications. These include, but are not limited to: allergic reactions (i.e.: anaphylactic or anaphylactoid reaction(s)); adrenal axis suppression; blood sugar elevation that in diabetics may result in ketoacidosis or comma; water retention that in patients with history of congestive heart failure may result in shortness of breath, pulmonary edema, and decompensation with resultant heart failure; weight gain; swelling or edema; medication-induced neural toxicity; particulate matter embolism and blood vessel occlusion with resultant organ, and/or nervous system infarction; and/or aseptic necrosis of one or more joints. Finally, the patient was informed that Medicine is not an exact science; therefore, there is also the possibility of unforeseen or unpredictable risks and/or possible complications that may result in a catastrophic outcome. The patient indicated having understood very clearly. We have given the patient no guarantees and we have made no promises. Enough time was given to the patient to ask questions, all of which were answered to the patient's satisfaction. Wanda [name] has indicated that she wanted to continue with the procedure. Attestation: I, the ordering provider, attest that I have discussed with the patient the benefits, risks, side-effects, alternatives, likelihood of achieving goals, and potential problems during recovery for the procedure that I have provided informed consent. Date:  06/28/2017  Pre-Procedure Preparation:  Monitoring: As per clinic protocol. Respiration, ETCO2, SpO2, BP, heart rate and rhythm monitor placed and checked for adequate function Safety Precautions: Patient was assessed for positional comfort and pressure points before starting the procedure. Time-out: I initiated and conducted the "Time-out" before starting the procedure, as per protocol. The patient was asked to participate by confirming the accuracy of the "Time Out" information. Verification of the correct person, site, and procedure were performed and confirmed by me, the nursing staff, and the patient. "Time-out" conducted as per Joint Commission's Universal Protocol (UP.01.01.01). "Time-out" Date & Time: 06/28/2017; 0919 hrs.  Description of Procedure Process:   Position: Prone Target Area: For Lumbar Facet blocks, the target is the groove formed by the junction of the transverse process and superior articular process. For the L5 dorsal ramus, the target is the notch between superior articular process and sacral ala. For the S1 dorsal ramus, the target is the superior and lateral edge of the posterior S1 Sacral foramen. Approach: Paramedial approach. Area Prepped: Entire Posterior Lumbosacral Region Prepping solution: ChloraPrep (2% chlorhexidine gluconate and 70% isopropyl alcohol) Safety Precautions: Aspiration looking for blood return was conducted prior to all injections. At no point did we inject any substances, as a needle was being advanced. No attempts were made at seeking any paresthesias. Safe injection practices and needle disposal techniques used. Medications properly checked for expiration dates. SDV (single dose vial) medications used. Description of the Procedure: Protocol guidelines were followed. The patient was placed in position over the fluoroscopy table. The target area was identified and the area prepped in the usual manner. Skin desensitized using vapocoolant spray. Skin &  deeper tissues infiltrated with local anesthetic. Appropriate amount of time allowed to pass for local anesthetics to take effect. The procedure needle was introduced through the skin, ipsilateral to the reported pain, and advanced to the target area. Employing the "Medial Branch Technique", the needles were advanced to the angle made by the superior and medial portion of the transverse process, and  the lateral and inferior portion of the superior articulating process of the targeted vertebral bodies. This area is known as "Burton's Eye" or the "Eye of the [place] Dog". A procedure needle was introduced through the skin, and this time advanced to the angle made by the superior and medial border of the sacral ala, and the lateral border of the S1 vertebral body. This last needle was later repositioned at the superior and lateral border of the posterior S1 foramen. Negative aspiration confirmed. Solution injected in intermittent fashion, asking for systemic symptoms every 0.5cc of injectate. The needles were then removed and the area cleansed, making sure to leave some of the prepping solution back to take advantage of its long term bactericidal properties.   Illustration of the posterior view of the lumbar spine and the posterior neural structures. Laminae of L2 through S1 are labeled. DPRL5, dorsal primary ramus of L5; DPRS1, dorsal primary ramus of S1; DPR3, dorsal primary ramus of L3; FJ, facet (zygapophyseal) joint L3-L4; I, inferior articular process of L4; LB1, lateral branch of dorsal primary ramus of L1; IAB, inferior articular branches from L3 medial branch (supplies L4-L5 facet joint); IBP, intermediate branch plexus; MB3, medial branch of dorsal primary ramus of L3; NR3, third lumbar nerve root; S, superior articular process of L5; SAB, superior articular branches from L4 (supplies L4-5 facet joint also); TP3, transverse process of L3.  Vitals:   06/28/17 0924 06/28/17 0928 06/28/17 0932 06/28/17  0934  BP: (!) 133/95 (!) 137/116 125/79 116/85  Pulse:      Resp: 15 13 20 16   Temp:      SpO2: 99% 98% 98% 98%  Weight:      Height:        Start Time: 0919 hrs. End Time: 0933 hrs. Materials:  Needle(s) Type: Regular needle Gauge: 25G Length: 3.5-in Medication(s): We administered lidocaine, triamcinolone acetonide, ropivacaine (PF) 2 mg/mL (0.2%), and triamcinolone acetonide. Please see chart orders for dosing details.  Imaging Guidance (Spinal):  Type of Imaging Technique: Fluoroscopy Guidance (Spinal) Indication(s): Assistance in needle guidance and placement for procedures requiring needle placement in or near specific anatomical locations not easily accessible without such assistance. Exposure Time: Please see nurses notes. Contrast: None used. Fluoroscopic Guidance: I was personally present during the use of fluoroscopy. "Tunnel Vision Technique" used to obtain the best possible view of the target area. Parallax error corrected before commencing the procedure. "Direction-depth-direction" technique used to introduce the needle under continuous pulsed fluoroscopy. Once target was reached, antero-posterior, oblique, and lateral fluoroscopic projection used confirm needle placement in all planes. Images permanently stored in EMR. Interpretation: No contrast injected. I personally interpreted the imaging intraoperatively. Adequate needle placement confirmed in multiple planes. Permanent images saved into the patient's record.  Antibiotic Prophylaxis:  Indication(s): None identified Antibiotic given: None  Post-operative Assessment:  Post-procedure Vital Signs:  Pulse Rate: 90 Temp: 97.9 F (36.6 C) ECG Heart Rate: (!) 105 Resp: 16 BP: 116/85 SpO2: 98 % ETCO2: (so sedation used )  EBL: None  Complications: No immediate post-treatment complications observed by team, or reported by patient.  Note: The patient tolerated the entire procedure well. A repeat set of vitals were  taken after the procedure and the patient was kept under observation following institutional policy, for this type of procedure. Post-procedural neurological assessment was performed, showing return to baseline, prior to discharge. The patient was provided with post-procedure discharge instructions, including a section on how to identify potential problems. Should any problems arise concerning this  procedure, the patient was given instructions to immediately contact us, at any time, without hesitation. In any case, we plan to contact the patient by telephone for a follow-up status report regarding this interventional procedure.  Comments:  No additional relevant information.  Plan of Care    Imaging Orders     DG C-Arm 1-60 Min-No Report  Procedure Orders     LUMBAR FACET(MEDIAL BRANCH NERVE BLOCK) MBNB  Medications ordered for procedure: Meds ordered this encounter  Medications  . lidocaine (XYLOCAINE) 2 % (with pres) injection 200 mg  . ropivacaine (PF) 2 mg/mL (0.2%) (NAROPIN) injection 9 mL  . triamcinolone acetonide (KENALOG-40) injection 40 mg  . ropivacaine (PF) 2 mg/mL (0.2%) (NAROPIN) injection 9 mL  . triamcinolone acetonide (KENALOG-40) injection 40 mg   Medications administered: We administered lidocaine, triamcinolone acetonide, ropivacaine (PF) 2 mg/mL (0.2%), and triamcinolone acetonide.  See the medical record for exact dosing, route, and time of administration.  New Prescriptions   No medications on file   Disposition: Discharge home  Discharge Date & Time: 06/28/2017; 0940 hrs.   Physician-requested Follow-up: Return for post-procedure eval (2 wks), w/ Dr. Dossie Arbour.  Future Appointments  Date Time Provider [place]  07/11/2017  2:20 PM End, Harrell Gave, MD [id number]  07/16/2017  9:15 AM Milinda Pointer, MD [facility] None  07/24/2017  2:30 PM Ursula Alert, MD [facility] None   Primary Care Physician: Gennette Pac, FNP Location: [facility]  Outpatient Pain Management Facility Note by: Gaspar Cola, MD Date: 06/28/2017; Time: 9:57 AM  Disclaimer:  Medicine is not an exact science. The only guarantee in medicine is that nothing is guaranteed. It is important to note that the decision to proceed with this intervention was based on the information collected from the patient. The Data and conclusions were drawn from the patient's questionnaire, the interview, and the physical examination. Because the information was provided in large part by the patient, it cannot be guaranteed that it has not been purposely or unconsciously manipulated. Every effort has been made to obtain as much relevant data as possible for this evaluation. It is important to note that the conclusions that lead to this procedure are derived in large part from the available data. Always take into account that the treatment will also be dependent on availability of resources and existing treatment guidelines, considered by other Pain Management Practitioners as being common knowledge and practice, at the time of the intervention. For Medico-Legal purposes, it is also important to point out that variation in procedural techniques and pharmacological choices are the acceptable norm. The indications, contraindications, technique, and results of the above procedure should only be interpreted and judged by a Board-Certified Interventional Pain Specialist with extensive familiarity and expertise in the same exact procedure and technique.

## 2017-06-28 NOTE — Progress Notes (Signed)
Safety precautions to be maintained throughout the outpatient stay will include: orient to surroundings, keep bed in low position, maintain call bell within reach at all times, provide assistance with transfer out of bed and ambulation.  

## 2017-06-29 ENCOUNTER — Telehealth: Payer: Self-pay

## 2017-06-29 NOTE — Telephone Encounter (Signed)
Post procedure phone call.  Left message.  

## 2017-07-03 ENCOUNTER — Telehealth: Payer: Self-pay

## 2017-07-03 DIAGNOSIS — F331 Major depressive disorder, recurrent, moderate: Secondary | ICD-10-CM

## 2017-07-03 NOTE — Telephone Encounter (Signed)
pt left message that she is not sleeping that you changed her klonopin and she can not sleep. she only gotten 10 hours of sleep over the last 3 days.

## 2017-07-03 NOTE — Telephone Encounter (Signed)
pt called left another message that she is not sleeping she needs something to help her sleep

## 2017-07-05 MED ORDER — TRAZODONE HCL 50 MG PO TABS: 25.0000 mg | ORAL_TABLET | Freq: Every day | ORAL | 1 refills | Status: DC

## 2017-07-05 NOTE — Telephone Encounter (Signed)
Called patient to discuss concerns about sleep. She has side effects to seroquel and wants to stop it. Discontinue Seroquel. Start Trazodone 25- 50 mg po qhs. She will call me back on Monday if she has further concerns.

## 2017-07-11 ENCOUNTER — Ambulatory Visit: Payer: Medicare HMO | Admitting: Internal Medicine

## 2017-07-16 ENCOUNTER — Other Ambulatory Visit: Payer: Self-pay

## 2017-07-16 ENCOUNTER — Encounter: Payer: Self-pay | Admitting: Pain Medicine

## 2017-07-16 ENCOUNTER — Ambulatory Visit: Payer: Medicare HMO | Attending: Pain Medicine | Admitting: Pain Medicine

## 2017-07-16 VITALS — BP 130/83 | HR 77 | Temp 98.1°F | Resp 16 | Ht 61.0 in | Wt 148.0 lb

## 2017-07-16 DIAGNOSIS — Z888 Allergy status to other drugs, medicaments and biological substances status: Secondary | ICD-10-CM | POA: Insufficient documentation

## 2017-07-16 DIAGNOSIS — E876 Hypokalemia: Secondary | ICD-10-CM | POA: Insufficient documentation

## 2017-07-16 DIAGNOSIS — Z885 Allergy status to narcotic agent status: Secondary | ICD-10-CM | POA: Insufficient documentation

## 2017-07-16 DIAGNOSIS — F419 Anxiety disorder, unspecified: Secondary | ICD-10-CM | POA: Diagnosis not present

## 2017-07-16 DIAGNOSIS — M545 Low back pain: Secondary | ICD-10-CM | POA: Diagnosis not present

## 2017-07-16 DIAGNOSIS — E785 Hyperlipidemia, unspecified: Secondary | ICD-10-CM | POA: Insufficient documentation

## 2017-07-16 DIAGNOSIS — Z7982 Long term (current) use of aspirin: Secondary | ICD-10-CM | POA: Insufficient documentation

## 2017-07-16 DIAGNOSIS — I471 Supraventricular tachycardia: Secondary | ICD-10-CM | POA: Insufficient documentation

## 2017-07-16 DIAGNOSIS — F329 Major depressive disorder, single episode, unspecified: Secondary | ICD-10-CM | POA: Insufficient documentation

## 2017-07-16 DIAGNOSIS — Z79899 Other long term (current) drug therapy: Secondary | ICD-10-CM | POA: Insufficient documentation

## 2017-07-16 DIAGNOSIS — M47816 Spondylosis without myelopathy or radiculopathy, lumbar region: Secondary | ICD-10-CM | POA: Diagnosis not present

## 2017-07-16 DIAGNOSIS — M1610 Unilateral primary osteoarthritis, unspecified hip: Secondary | ICD-10-CM | POA: Diagnosis not present

## 2017-07-16 DIAGNOSIS — G43909 Migraine, unspecified, not intractable, without status migrainosus: Secondary | ICD-10-CM | POA: Diagnosis not present

## 2017-07-16 DIAGNOSIS — K219 Gastro-esophageal reflux disease without esophagitis: Secondary | ICD-10-CM | POA: Diagnosis not present

## 2017-07-16 DIAGNOSIS — Z87891 Personal history of nicotine dependence: Secondary | ICD-10-CM | POA: Diagnosis not present

## 2017-07-16 DIAGNOSIS — G8929 Other chronic pain: Secondary | ICD-10-CM | POA: Diagnosis not present

## 2017-07-16 DIAGNOSIS — G894 Chronic pain syndrome: Secondary | ICD-10-CM | POA: Diagnosis not present

## 2017-07-16 DIAGNOSIS — M899 Disorder of bone, unspecified: Secondary | ICD-10-CM | POA: Diagnosis not present

## 2017-07-16 DIAGNOSIS — M533 Sacrococcygeal disorders, not elsewhere classified: Secondary | ICD-10-CM

## 2017-07-16 DIAGNOSIS — Z789 Other specified health status: Secondary | ICD-10-CM

## 2017-07-16 NOTE — Progress Notes (Signed)
Safety precautions to be maintained throughout the outpatient stay will include: orient to surroundings, keep bed in low position, maintain call bell within reach at all times, provide assistance with transfer out of bed and ambulation.  

## 2017-07-16 NOTE — Patient Instructions (Addendum)
____________________________________________________________________________________________  Preparing for your procedure (without sedation) Instructions: . Oral Intake: Do not eat or drink anything for at least 3 hours prior to your procedure. . Transportation: Unless otherwise stated by your physician, you may drive yourself after the procedure. . Blood Pressure Medicine: Take your blood pressure medicine with a sip of water the morning of the procedure. . Blood thinners:  . Diabetics on insulin: Notify the staff so that you can be scheduled 1st case in the morning. If your diabetes requires high dose insulin, take only  of your normal insulin dose the morning of the procedure and notify the staff that you have done so. . Preventing infections: Shower with an antibacterial soap the morning of your procedure.  . Build-up your immune system: Take 1000 mg of Vitamin C with every meal (3 times a day) the day prior to your procedure. Marland Kitchen Antibiotics: Inform the staff if you have a condition or reason that requires you to take antibiotics before dental procedures. . Pregnancy: If you are pregnant, call and cancel the procedure. . Sickness: If you have a cold, fever, or any active infections, call and cancel the procedure. . Arrival: You must be in the facility at least 30 minutes prior to your scheduled procedure. . Children: Do not bring any children with you. . Dress appropriately: Bring dark clothing that you would not mind if they get stained. . Valuables: Do not bring any jewelry or valuables. Procedure appointments are reserved for interventional treatments only. Marland Kitchen No Prescription Refills. . No medication changes will be discussed during procedure appointments. . No disability issues will be discussed. ____________________________________________________________________________________________   GENERAL RISKS AND COMPLICATIONS  What are the risk, side effects and possible  complications? Generally speaking, most procedures are safe.  However, with any procedure there are risks, side effects, and the possibility of complications.  The risks and complications are dependent upon the sites that are lesioned, or the type of nerve block to be performed.  The closer the procedure is to the spine, the more serious the risks are.  Great care is taken when placing the radio frequency needles, block needles or lesioning probes, but sometimes complications can occur. 1. Infection: Any time there is an injection through the skin, there is a risk of infection.  This is why sterile conditions are used for these blocks.  There are four possible types of infection. 1. Localized skin infection. 2. Central Nervous System Infection-This can be in the form of Meningitis, which can be deadly. 3. Epidural Infections-This can be in the form of an epidural abscess, which can cause pressure inside of the spine, causing compression of the spinal cord with subsequent paralysis. This would require an emergency surgery to decompress, and there are no guarantees that the patient would recover from the paralysis. 4. Discitis-This is an infection of the intervertebral discs.  It occurs in about 1% of discography procedures.  It is difficult to treat and it may lead to surgery.        2. Pain: the needles have to go through skin and soft tissues, will cause soreness.       3. Damage to internal structures:  The nerves to be lesioned may be near blood vessels or    other nerves which can be potentially damaged.       4. Bleeding: Bleeding is more common if the patient is taking blood thinners such as  aspirin, Coumadin, Ticiid, Plavix, etc., or if he/she have some genetic predisposition  such as hemophilia. Bleeding into the spinal canal can cause compression of the spinal  cord with subsequent paralysis.  This would require an emergency surgery to  decompress and there are no guarantees that the patient  would recover from the  paralysis.       5. Pneumothorax:  Puncturing of a lung is a possibility, every time a needle is introduced in  the area of the chest or upper back.  Pneumothorax refers to free air around the  collapsed lung(s), inside of the thoracic cavity (chest cavity).  Another two possible  complications related to a similar event would include: Hemothorax and Chylothorax.   These are variations of the Pneumothorax, where instead of air around the collapsed  lung(s), you may have blood or chyle, respectively.       6. Spinal headaches: They may occur with any procedures in the area of the spine.       7. Persistent CSF (Cerebro-Spinal Fluid) leakage: This is a rare problem, but may occur  with prolonged intrathecal or epidural catheters either due to the formation of a fistulous  track or a dural tear.       8. Nerve damage: By working so close to the spinal cord, there is always a possibility of  nerve damage, which could be as serious as a permanent spinal cord injury with  paralysis.       9. Death:  Although rare, severe deadly allergic reactions known as "Anaphylactic  reaction" can occur to any of the medications used.      10. Worsening of the symptoms:  We can always make thing worse.  What are the chances of something like this happening? Chances of any of this occuring are extremely low.  By statistics, you have more of a chance of getting killed in a motor vehicle accident: while driving to the hospital than any of the above occurring .  Nevertheless, you should be aware that they are possibilities.  In general, it is similar to taking a shower.  Everybody knows that you can slip, hit your head and get killed.  Does that mean that you should not shower again?  Nevertheless always keep in mind that statistics do not mean anything if you happen to be on the wrong side of them.  Even if a procedure has a 1 (one) in a 1,000,000 (million) chance of going wrong, it you happen to be that  one..Also, keep in mind that by statistics, you have more of a chance of having something go wrong when taking medications.  Who should not have this procedure? If you are on a blood thinning medication (e.g. Coumadin, Plavix, see list of "Blood Thinners"), or if you have an active infection going on, you should not have the procedure.  If you are taking any blood thinners, please inform your physician.  How should I prepare for this procedure?  Do not eat or drink anything at least six hours prior to the procedure.  Bring a driver with you .  It cannot be a taxi.  Come accompanied by an adult that can drive you back, and that is strong enough to help you if your legs get weak or numb from the local anesthetic.  Take all of your medicines the morning of the procedure with just enough water to swallow them.  If you have diabetes, make sure that you are scheduled to have your procedure done first thing in the morning, whenever possible.  If you  have diabetes, take only half of your insulin dose and notify our nurse that you have done so as soon as you arrive at the clinic.  If you are diabetic, but only take blood sugar pills (oral hypoglycemic), then do not take them on the morning of your procedure.  You may take them after you have had the procedure.  Do not take aspirin or any aspirin-containing medications, at least eleven (11) days prior to the procedure.  They may prolong bleeding.  Wear loose fitting clothing that may be easy to take off and that you would not mind if it got stained with Betadine or blood.  Do not wear any jewelry or perfume  Remove any nail coloring.  It will interfere with some of our monitoring equipment.  NOTE: Remember that this is not meant to be interpreted as a complete list of all possible complications.  Unforeseen problems may occur.  BLOOD THINNERS The following drugs contain aspirin or other products, which can cause increased bleeding during  surgery and should not be taken for 2 weeks prior to and 1 week after surgery.  If you should need take something for relief of minor pain, you may take acetaminophen which is found in Tylenol,m Datril, Anacin-3 and Panadol. It is not blood thinner. The products listed below are.  Do not take any of the products listed below in addition to any listed on your instruction sheet.  A.P.C or A.P.C with Codeine Codeine Phosphate Capsules #3 Ibuprofen Ridaura  ABC compound Congesprin Imuran rimadil  Advil Cope Indocin Robaxisal  Alka-Seltzer Effervescent Pain Reliever and Antacid Coricidin or Coricidin-D  Indomethacin Rufen  Alka-Seltzer plus Cold Medicine Cosprin Ketoprofen S-A-C Tablets  Anacin Analgesic Tablets or Capsules Coumadin Korlgesic Salflex  Anacin Extra Strength Analgesic tablets or capsules CP-2 Tablets Lanoril Salicylate  Anaprox Cuprimine Capsules Levenox Salocol  Anexsia-D Dalteparin Magan Salsalate  Anodynos Darvon compound Magnesium Salicylate Sine-off  Ansaid Dasin Capsules Magsal Sodium Salicylate  Anturane Depen Capsules Marnal Soma  APF Arthritis pain formula Dewitt's Pills Measurin Stanback  Argesic Dia-Gesic Meclofenamic Sulfinpyrazone  Arthritis Bayer Timed Release Aspirin Diclofenac Meclomen Sulindac  Arthritis pain formula Anacin Dicumarol Medipren Supac  Analgesic (Safety coated) Arthralgen Diffunasal Mefanamic Suprofen  Arthritis Strength Bufferin Dihydrocodeine Mepro Compound Suprol  Arthropan liquid Dopirydamole Methcarbomol with Aspirin Synalgos  ASA tablets/Enseals Disalcid Micrainin Tagament  Ascriptin Doan's Midol Talwin  Ascriptin A/D Dolene Mobidin Tanderil  Ascriptin Extra Strength Dolobid Moblgesic Ticlid  Ascriptin with Codeine Doloprin or Doloprin with Codeine Momentum Tolectin  Asperbuf Duoprin Mono-gesic Trendar  Aspergum Duradyne Motrin or Motrin IB Triminicin  Aspirin plain, buffered or enteric coated Durasal Myochrisine Trigesic  Aspirin  Suppositories Easprin Nalfon Trillsate  Aspirin with Codeine Ecotrin Regular or Extra Strength Naprosyn Uracel  Atromid-S Efficin Naproxen Ursinus  Auranofin Capsules Elmiron Neocylate Vanquish  Axotal Emagrin Norgesic Verin  Azathioprine Empirin or Empirin with Codeine Normiflo Vitamin E  Azolid Emprazil Nuprin Voltaren  Bayer Aspirin plain, buffered or children's or timed BC Tablets or powders Encaprin Orgaran Warfarin Sodium  Buff-a-Comp Enoxaparin Orudis Zorpin  Buff-a-Comp with Codeine Equegesic Os-Cal-Gesic   Buffaprin Excedrin plain, buffered or Extra Strength Oxalid   Bufferin Arthritis Strength Feldene Oxphenbutazone   Bufferin plain or Extra Strength Feldene Capsules Oxycodone with Aspirin   Bufferin with Codeine Fenoprofen Fenoprofen Pabalate or Pabalate-SF   Buffets II Flogesic Panagesic   Buffinol plain or Extra Strength Florinal or Florinal with Codeine Panwarfarin   Buf-Tabs Flurbiprofen Penicillamine   Butalbital Compound Four-way  cold tablets Penicillin   Butazolidin Fragmin Pepto-Bismol   Carbenicillin Geminisyn Percodan   Carna Arthritis Reliever Geopen Persantine   Carprofen Gold's salt Persistin   Chloramphenicol Goody's Phenylbutazone   Chloromycetin Haltrain Piroxlcam   Clmetidine heparin Plaquenil   Cllnoril Hyco-pap Ponstel   Clofibrate Hydroxy chloroquine Propoxyphen         Before stopping any of these medications, be sure to consult the physician who ordered them.  Some, such as Coumadin (Warfarin) are ordered to prevent or treat serious conditions such as "deep thrombosis", "pumonary embolisms", and other heart problems.  The amount of time that you may need off of the medication may also vary with the medication and the reason for which you were taking it.  If you are taking any of these medications, please make sure you notify your pain physician before you undergo any procedures.         Sacroiliac (SI) Joint Injection Patient  Information  Description: The sacroiliac joint connects the scrum (very low back and tailbone) to the ilium (a pelvic bone which also forms half of the hip joint).  Normally this joint experiences very little motion.  When this joint becomes inflamed or unstable low back and or hip and pelvis pain may result.  Injection of this joint with local anesthetics (numbing medicines) and steroids can provide diagnostic information and reduce pain.  This injection is performed with the aid of x-ray guidance into the tailbone area while you are lying on your stomach.   You may experience an electrical sensation down the leg while this is being done.  You may also experience numbness.  We also may ask if we are reproducing your normal pain during the injection.  Conditions which may be treated SI injection:   Low back, buttock, hip or leg pain  Preparation for the Injection:  1. Do not eat any solid food or dairy products within 8 hours of your appointment.  2. You may drink clear liquids up to 3 hours before appointment.  Clear liquids include water, black coffee, juice or soda.  No milk or cream please. 3. You may take your regular medications, including pain medications with a sip of water before your appointment.  Diabetics should hold regular insulin (if take separately) and take 1/2 normal NPH dose the morning of the procedure.  Carry some sugar containing items with you to your appointment. 4. A driver must accompany you and be prepared to drive you home after your procedure. 5. Bring all of your current medications with you. 6. An IV may be inserted and sedation may be given at the discretion of the physician. 7. A blood pressure cuff, EKG and other monitors will often be applied during the procedure.  Some patients may need to have extra oxygen administered for a short period.  8. You will be asked to provide medical information, including your allergies, prior to the procedure.  We must know  immediately if you are taking blood thinners (like Coumadin/Warfarin) or if you are allergic to IV iodine contrast (dye).  We must know if you could possible be pregnant.  Possible side effects:   Bleeding from needle site  Infection (rare, may require surgery)  Nerve injury (rare)  Numbness & tingling (temporary)  A brief convulsion or seizure  Light-headedness (temporary)  Pain at injection site (several days)  Decreased blood pressure (temporary)  Weakness in the leg (temporary)   Call if you experience:   New onset weakness or numbness  of an extremity below the injection site that last more than 8 hours.  Hives or difficulty breathing ( go to the emergency room)  Inflammation or drainage at the injection site  Any new symptoms which are concerning to you  Please note:  Although the local anesthetic injected can often make your back/ hip/ buttock/ leg feel good for several hours after the injections, the pain will likely return.  It takes 3-7 days for steroids to work in the sacroiliac area.  You may not notice any pain relief for at least that one week.  If effective, we will often do a series of three injections spaced 3-6 weeks apart to maximally decrease your pain.  After the initial series, we generally will wait some months before a repeat injection of the same type.  If you have any questions, please call [phone] [facility]

## 2017-07-16 NOTE — Progress Notes (Signed)
Patient's Name: Wanda [name]  MRN: [id number]  Referring Provider: Gennette Pac, FNP  DOB: 06/11/1955  PCP: Gennette Pac, FNP  DOS: 07/16/2017  Note by: Gaspar Cola, MD  Service setting: Ambulatory outpatient  Specialty: Interventional Pain Management  Location: [facility] (AMB) Pain Management Facility    Patient type: Established   Primary Reason(s) for Visit: Encounter for post-procedure evaluation of chronic illness with mild to moderate exacerbation CC: Back Pain (low) and Migraine  HPI  [name] is a 62 y.o. year old, female patient, who comes today for a post-procedure evaluation. 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); Lumbar spondylosis; Chronic lower extremity pain (Left); Lumbar facet arthropathy; Lumbar facet syndrome (Bilateral) (R>L); Chronic lower extremity pain (Secondary source of pain) (Right); Chronic hip pain (Right); Osteoarthritis of hip (Right); Chronic neck pain; Closed fracture of hip ([place]); Orthostatic lightheadedness; Atypical chest pain; Chronic pain syndrome; Closed intertrochanteric fracture ([place]); History of chest pain; Compression fracture of L1 lumbar vertebra (old); Pharmacologic therapy; Polypharmacy; Disorder of skeletal system; Problems influencing health status; Sinus tachycardia; Lumbar spondylosis w/o Radiculopathy; and Chronic sacroiliac joint pain (Left) on their problem list. Her primarily concern today is the Back Pain (low) and Migraine  Pain Assessment: Location: Lower Back Onset: More than a month ago Duration:  Chronic pain Quality: Aching Severity: 2 /10 (self-reported pain score)  Note: Reported level is compatible with observation.                         When using our objective Pain Scale, levels between 6 and 10/10 are said to belong in an emergency room, as it progressively worsens from a 6/10, described as severely limiting, requiring emergency care not usually available at an outpatient pain management facility. At a 6/10 level, communication becomes difficult and requires great effort. Assistance to reach the emergency department may be required. Facial flushing and profuse sweating along with potentially dangerous increases in heart rate and blood pressure will be evident. Timing: Intermittent Modifying factors: procedures  [name] comes in today for post-procedure evaluation after the treatment done on 06/28/2017.  Further details on both, my assessment(s), as well as the proposed treatment plan, please see below.  Post-Procedure Assessment  06/28/2017 Procedure: Diagnostic bilateral lumbar facet block #3 under fluoroscopic guidance and IV sedation (the left side has been done 4 times). Pre-procedure pain score:  8/10 Post-procedure pain score: 4/10 (> 50% relief) Influential Factors: BMI: 27.96 kg/m Intra-procedural challenges: None observed.         Assessment challenges: None detected.              Reported side-effects: None.        Post-procedural adverse reactions or complications: None reported         Sedation: No sedation used. When no sedatives are used, the analgesic levels obtained are directly associated to the effectiveness of the local anesthetics. However, when sedation is provided, the level of analgesia obtained during the initial 1 hour following the intervention, is believed to be the result of a combination of factors. These factors may include, but are not limited to: 1. The effectiveness of the local anesthetics used. 2. The effects of the analgesic(s)  and/or  anxiolytic(s) used. 3. The degree of discomfort experienced by the patient at the time of the procedure. 4. The patients ability and reliability in recalling and recording the events. 5. The presence and influence of possible secondary gains and/or psychosocial factors. Reported result: Relief experienced during the 1st hour after the procedure: 50 % (Ultra-Short Term Relief)            Interpretative annotation: Clinically appropriate result. No IV Analgesic or Anxiolytic given, therefore benefits are completely due to Local Anesthetic effects. Partial relief from local anesthetics would suggest that treated area is not 100% responsible for the patient's symptoms.  Effects of local anesthetic: The analgesic effects attained during this period are directly associated to the localized infiltration of local anesthetics and therefore cary significant diagnostic value as to the etiological location, or anatomical origin, of the pain. Expected duration of relief is directly dependent on the pharmacodynamics of the local anesthetic used. Long-acting (4-6 hours) anesthetics used.  Reported result: Relief during the next 4 to 6 hour after the procedure: 50 % (Short-Term Relief)            Interpretative annotation: Clinically appropriate result. Analgesia during this period is likely to be Local Anesthetic-related.          Long-term benefit: Defined as the period of time past the expected duration of local anesthetics (1 hour for short-acting and 4-6 hours for long-acting). With the possible exception of prolonged sympathetic blockade from the local anesthetics, benefits during this period are typically attributed to, or associated with, other factors such as analgesic sensory neuropraxia, antiinflammatory effects, or beneficial biochemical changes provided by agents other than the local anesthetics.  Reported result: Extended relief following procedure: 80 % (Long-Term Relief) [name] reports the  extremity pain improved more than the axial pain. Interpretative annotation: Clinically appropriate result. Good relief. No permanent benefit expected. Inflammation plays a part in the etiology to the pain.          Current benefits: Defined as reported results that persistent at this point in time.   Analgesia: 50 %            Function: Ms. Nilsson reports improvement in function ROM: Ms. Faulstich reports improvement in ROM Interpretative annotation: Ongoing benefit. Incomplete therapeutic success. Adequate anti-inflammatory effects.          Interpretation: Results would suggest a successful diagnostic intervention.                  Plan:  Re-assessment of pain etiology. PE today was positive for left SI pain on patrick maneuver.           Laboratory Chemistry  Renal Function Markers Lab Results  Component Value Date   BUN 9 04/26/2017   CREATININE 0.65 04/26/2017   GFRAA 111 04/26/2017   GFRNONAA 96 04/26/2017                 Hepatic Function Markers Lab Results  Component Value Date   AST 36 08/09/2016   ALT 29 08/09/2016   ALBUMIN 4.6 08/09/2016   ALKPHOS 84 08/09/2016                 Electrolytes Lab Results  Component Value Date   NA 140 04/26/2017   K 4.1 04/26/2017   CL 101 04/26/2017   CALCIUM 9.5 04/26/2017   MG 2.2 02/06/2017  Neuropathy Markers Lab Results  Component Value Date   HGBA1C 5.4 05/29/2016                 Coagulation Parameters Lab Results  Component Value Date   PLT 232 02/06/2017                 Cardiovascular Markers Lab Results  Component Value Date   TROPONINI <0.03 05/29/2016   HGB 12.9 02/06/2017   HCT 38.4 02/06/2017                 Note: Lab results reviewed.  Recent Diagnostic Imaging Results  DG C-Arm 1-60 Min-No Report Fluoroscopy was utilized by the requesting physician.  No radiographic  interpretation.   Complexity Note: Imaging results reviewed. Results shared with Ms. Brienza,  using Layman's terms.                         Meds   Current Outpatient Medications:  .  alendronate (FOSAMAX) 10 MG tablet, Take 10 mg by mouth daily before breakfast. Take with a full glass of water on an empty stomach., Disp: , Rfl:  .  aspirin EC 81 MG tablet, Take 81 mg by mouth daily., Disp: , Rfl:  .  atorvastatin (LIPITOR) 40 MG tablet, Take 40 mg by mouth daily at 6 (six) AM. , Disp: , Rfl:  .  Cholecalciferol (VITAMIN D3) 5000 units TABS, Take 5,000 Units by mouth daily., Disp: , Rfl:  .  docusate sodium (COLACE) 100 MG capsule, Take 100 mg by mouth daily., Disp: , Rfl:  .  hydrOXYzine (VISTARIL) 25 MG capsule, take 1 to 2 capsules by mouth at bedtime if needed for anxiety (SLEEP), Disp: , Rfl: 0 .  Multiple Vitamin (MULTIVITAMIN) tablet, Take 1 tablet by mouth daily., Disp: , Rfl:  .  pantoprazole (PROTONIX) 40 MG tablet, Take 40 mg by mouth 2 (two) times daily. , Disp: , Rfl:  .  polyethylene glycol (MIRALAX / GLYCOLAX) packet, Take 17 g by mouth as needed. , Disp: , Rfl:  .  VIIBRYD 20 MG TABS, , Disp: , Rfl: 0  ROS  Constitutional: Denies any fever or chills Gastrointestinal: No reported hemesis, hematochezia, vomiting, or acute GI distress Musculoskeletal: Denies any acute onset joint swelling, redness, loss of ROM, or weakness Neurological: No reported episodes of acute onset apraxia, aphasia, dysarthria, agnosia, amnesia, paralysis, loss of coordination, or loss of consciousness  Allergies  Ms. Penninger is allergic to levofloxacin; mobic [meloxicam]; propoxyphene hcl; tramadol; and codeine.  [place]  Drug: Ms. Rockefeller  reports that she does not use drugs. Alcohol:  reports that she drinks alcohol. Tobacco:  reports that she quit smoking about 27 years ago. Her smoking use included cigarettes. She has a 3.50 pack-year smoking history. She quit smokeless tobacco use about 26 years ago. 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 Surgical: Ms. Fant  has a past surgical history that includes Shoulder surgery; Appendectomy; Abdominal hysterectomy; Back surgery; Spinal cord stimulator; Colonoscopy (N/A, 08/28/2013); Breast excisional biopsy (Right); and Intramedullary (im) nail intertrochanteric (Left, 05/30/2016). Family: family history includes Alcohol abuse in her daughter; Anxiety disorder in her daughter; Breast cancer in  her paternal aunt; COPD in her father and mother; Cancer in her cousin and paternal aunt; Depression in her daughter; Diabetes in her cousin, father, mother, paternal grandmother, and sister; Drug abuse in her daughter; Heart attack (age of onset: 86) in her father; Heart disease in her father and mother; Tuberculosis in her maternal aunt.  Constitutional Exam  General appearance: Well nourished, well developed, and well hydrated. In no apparent acute distress Vitals:   07/16/17 0903  BP: 130/83  Pulse: 77  Resp: 16  Temp: 98.1 F (36.7 C)  TempSrc: Oral  SpO2: 100%  Weight: 148 lb (67.1 kg)  Height: 5\' 1"  (1.549 m)   BMI Assessment: Estimated body mass index is 27.96 kg/m as calculated from the following:   Height as of this encounter: 5\' 1"  (1.549 m).   Weight as of this encounter: 148 lb (67.1 kg).  BMI interpretation table: BMI level Category Range association with higher incidence of chronic pain  <18 kg/m2 Underweight   18.5-24.9 kg/m2 Ideal body weight   25-29.9 kg/m2 Overweight Increased incidence by 20%  30-34.9 kg/m2 Obese (Class I) Increased incidence by 68%  35-39.9 kg/m2 Severe  obesity (Class II) Increased incidence by 136%  >40 kg/m2 Extreme obesity (Class III) Increased incidence by 254%   BMI Readings from Last 4 Encounters:  07/16/17 27.96 kg/m  06/28/17 28.53 kg/m  04/26/17 26.69 kg/m  04/10/17 26.59 kg/m   Wt Readings from Last 4 Encounters:  07/16/17 148 lb (67.1 kg)  06/28/17 151 lb (68.5 kg)  04/26/17 141 lb 4 oz (64.1 kg)  04/10/17 140 lb 12 oz (63.8 kg)  Psych/Mental status: Alert, oriented x 3 (person, place, & time)       Eyes: PERLA Respiratory: No evidence of acute respiratory distress  Cervical Spine Area Exam  Skin & Axial Inspection: No masses, redness, edema, swelling, or associated skin lesions Alignment: Symmetrical Functional ROM: Unrestricted ROM      Stability: No instability detected Muscle Tone/Strength: Functionally intact. No obvious neuro-muscular anomalies detected. Sensory (Neurological): Unimpaired Palpation: No palpable anomalies              Upper Extremity (UE) Exam    Side: Right upper extremity  Side: Left upper extremity  Skin & Extremity Inspection: Skin color, temperature, and hair growth are WNL. No peripheral edema or cyanosis. No masses, redness, swelling, asymmetry, or associated skin lesions. No contractures.  Skin & Extremity Inspection: Skin color, temperature, and hair growth are WNL. No peripheral edema or cyanosis. No masses, redness, swelling, asymmetry, or associated skin lesions. No contractures.  Functional ROM: Unrestricted ROM          Functional ROM: Unrestricted ROM          Muscle Tone/Strength: Functionally intact. No obvious neuro-muscular anomalies detected.  Muscle Tone/Strength: Functionally intact. No obvious neuro-muscular anomalies detected.  Sensory (Neurological): Unimpaired          Sensory (Neurological): Unimpaired          Palpation: No palpable anomalies              Palpation: No palpable anomalies              Specialized Test(s): Deferred         Specialized Test(s): Deferred           Thoracic Spine Area Exam  Skin & Axial Inspection: No masses, redness, or swelling Alignment: Symmetrical Functional ROM: Unrestricted ROM Stability: No instability detected Muscle Tone/Strength: Functionally intact. No obvious  neuro-muscular anomalies detected. Sensory (Neurological): Unimpaired Muscle strength & Tone: No palpable anomalies  Lumbar Spine Area Exam  Skin & Axial Inspection: No masses, redness, or swelling Alignment: Symmetrical Functional ROM: Improved after treatment      Stability: No instability detected Muscle Tone/Strength: Functionally intact. No obvious neuro-muscular anomalies detected. Sensory (Neurological): Unimpaired Palpation: No palpable anomalies       Provocative Tests: Lumbar Hyperextension and rotation test: Improved after treatment       Lumbar Lateral bending test: evaluation deferred today       Patrick's Maneuver: Positive for left-sided S-I arthralgia              Gait & Posture Assessment  Ambulation: Unassisted Gait: Antalgic Posture: Antalgic   Lower Extremity Exam    Side: Right lower extremity  Side: Left lower extremity  Skin & Extremity Inspection: Skin color, temperature, and hair growth are WNL. No peripheral edema or cyanosis. No masses, redness, swelling, asymmetry, or associated skin lesions. No contractures.  Skin & Extremity Inspection: Skin color, temperature, and hair growth are WNL. No peripheral edema or cyanosis. No masses, redness, swelling, asymmetry, or associated skin lesions. No contractures.  Functional ROM: Unrestricted ROM          Functional ROM: Unrestricted ROM          Muscle Tone/Strength: Functionally intact. No obvious neuro-muscular anomalies detected.  Muscle Tone/Strength: Functionally intact. No obvious neuro-muscular anomalies detected.  Sensory (Neurological): Unimpaired  Sensory (Neurological): Unimpaired  Palpation: No palpable anomalies  Palpation: No palpable anomalies   Assessment   Primary Diagnosis & Pertinent Problem List: The primary encounter diagnosis was Chronic sacroiliac joint pain (Left). Diagnoses of Chronic low back pain (Primary Source of Pain) (Bilateral) (R>L), Lumbar facet syndrome (Bilateral) (R>L), Disorder of skeletal system, and Problems influencing health status were also pertinent to this visit.  Status Diagnosis  Persistent Improved Controlled 1. Chronic sacroiliac joint pain (Left)   2. Chronic low back pain (Primary Source of Pain) (Bilateral) (R>L)   3. Lumbar facet syndrome (Bilateral) (R>L)   4. Disorder of skeletal system   5. Problems influencing health status     Problems updated and reviewed during this visit: Problem  Lumbar spondylosis w/o Radiculopathy  Chronic sacroiliac joint pain (Left)   Plan of Care  Pharmacotherapy (Medications Ordered): No orders of the defined types were placed in this encounter.  Medications administered today: [name] had no medications administered during this visit.   Procedure Orders     SACROILIAC JOINT INJECTION  Lab Orders     Sedimentation rate     25-Hydroxyvitamin D Lcms D2+D3     C-reactive protein Imaging Orders  No imaging studies ordered today   Referral Orders  No referral(s) requested today    Interventional management options: Planned, scheduled, and/or pending:   Diagnostic left-sided SI block under fluoroscopic guidance, no sedation   Considering:   Diagnostic bilateral lumbar facet block #4 Possible bilateral lumbar facet RFA, starting with the left side. Diagnostic(Midline)L1 to lumbar epidural steroid injection   Palliative PRN treatment(s):   Palliative bilateral versus left sided lumbar facet block    Provider-requested follow-up: Return for Procedure (no sedation): (L) SI Blk.  Future Appointments  Date Time Provider [place]  07/24/2017  2:30 PM Ursula Alert, MD [facility] None   Primary Care Physician: Gennette Pac,  FNP Location: [facility] Note by: Gaspar Cola, MD Date: 07/16/2017; Time: 4:10 PM

## 2017-07-19 ENCOUNTER — Telehealth: Payer: Self-pay | Admitting: Pain Medicine

## 2017-07-19 NOTE — Telephone Encounter (Signed)
Patient lvmail asking Nurse to call, she is having Muscle spasms in her back

## 2017-07-20 NOTE — Telephone Encounter (Signed)
Spoke with patient and she states she has a painful knot in her back and would like an injection.  No prn orders for TPI but will discuss with Dr Dossie Arbour on Monday.

## 2017-07-21 LAB — SEDIMENTATION RATE: SED RATE: 5 mm/h (ref 0–40)

## 2017-07-21 LAB — C-REACTIVE PROTEIN: CRP: 1.7 mg/L (ref 0.0–4.9)

## 2017-07-21 LAB — 25-HYDROXYVITAMIN D LCMS D2+D3: 25-HYDROXY, VITAMIN D: 46 ng/mL

## 2017-07-21 LAB — 25-HYDROXY VITAMIN D LCMS D2+D3
25-Hydroxy, Vitamin D-2: <1 ng/mL
25-Hydroxy, Vitamin D-3: 46 ng/mL

## 2017-07-23 ENCOUNTER — Other Ambulatory Visit: Payer: Self-pay | Admitting: Pain Medicine

## 2017-07-23 DIAGNOSIS — M7918 Myalgia, other site: Secondary | ICD-10-CM

## 2017-07-23 DIAGNOSIS — G8929 Other chronic pain: Secondary | ICD-10-CM

## 2017-07-23 DIAGNOSIS — M549 Dorsalgia, unspecified: Secondary | ICD-10-CM

## 2017-07-23 DIAGNOSIS — M545 Low back pain, unspecified: Secondary | ICD-10-CM

## 2017-07-23 NOTE — Telephone Encounter (Signed)
She had an order in for [facility] which I got authorization for, so that is what I scheduled her for. Trigger points do not need prior auth if done in room.

## 2017-07-23 NOTE — Telephone Encounter (Signed)
Please schedule for a Lumbar TPI tomorrow per Dr Dossie Arbour.

## 2017-07-24 ENCOUNTER — Ambulatory Visit: Payer: Medicare HMO | Admitting: Psychiatry

## 2017-07-24 ENCOUNTER — Ambulatory Visit: Payer: Medicare HMO | Attending: Pain Medicine | Admitting: Pain Medicine

## 2017-07-24 ENCOUNTER — Encounter: Payer: Self-pay | Admitting: Psychiatry

## 2017-07-24 ENCOUNTER — Other Ambulatory Visit: Payer: Self-pay

## 2017-07-24 ENCOUNTER — Encounter: Payer: Self-pay | Admitting: Pain Medicine

## 2017-07-24 VITALS — BP 140/87 | HR 105 | Temp 98.3°F | Resp 18 | Ht 61.0 in | Wt 148.0 lb

## 2017-07-24 VITALS — BP 125/81 | HR 112 | Temp 98.5°F | Wt 146.0 lb

## 2017-07-24 DIAGNOSIS — Z885 Allergy status to narcotic agent status: Secondary | ICD-10-CM | POA: Insufficient documentation

## 2017-07-24 DIAGNOSIS — M25551 Pain in right hip: Secondary | ICD-10-CM | POA: Diagnosis not present

## 2017-07-24 DIAGNOSIS — Z888 Allergy status to other drugs, medicaments and biological substances status: Secondary | ICD-10-CM | POA: Insufficient documentation

## 2017-07-24 DIAGNOSIS — F331 Major depressive disorder, recurrent, moderate: Secondary | ICD-10-CM | POA: Diagnosis not present

## 2017-07-24 DIAGNOSIS — G8929 Other chronic pain: Secondary | ICD-10-CM | POA: Diagnosis not present

## 2017-07-24 DIAGNOSIS — M545 Low back pain: Secondary | ICD-10-CM | POA: Diagnosis not present

## 2017-07-24 DIAGNOSIS — F411 Generalized anxiety disorder: Secondary | ICD-10-CM

## 2017-07-24 DIAGNOSIS — F4010 Social phobia, unspecified: Secondary | ICD-10-CM

## 2017-07-24 DIAGNOSIS — M7918 Myalgia, other site: Secondary | ICD-10-CM

## 2017-07-24 DIAGNOSIS — M549 Dorsalgia, unspecified: Secondary | ICD-10-CM

## 2017-07-24 DIAGNOSIS — M47816 Spondylosis without myelopathy or radiculopathy, lumbar region: Secondary | ICD-10-CM | POA: Diagnosis not present

## 2017-07-24 MED ORDER — LIDOCAINE HCL 2 % IJ SOLN
INTRAMUSCULAR | Status: AC
  Filled 2017-07-24: qty 20

## 2017-07-24 MED ORDER — LIDOCAINE HCL 2 % IJ SOLN
20.0000 mL | Freq: Once | INTRAMUSCULAR | Status: AC
  Administered 2017-07-24: 400 mg

## 2017-07-24 MED ORDER — TRIAMCINOLONE ACETONIDE 40 MG/ML IJ SUSP
INTRAMUSCULAR | Status: AC
  Filled 2017-07-24: qty 1

## 2017-07-24 MED ORDER — DOXEPIN HCL 10 MG PO CAPS: 10.0000 mg | ORAL_CAPSULE | Freq: Every day | ORAL | 1 refills | Status: DC

## 2017-07-24 MED ORDER — ROPIVACAINE HCL 2 MG/ML IJ SOLN
9.0000 mL | Freq: Once | INTRAMUSCULAR | Status: AC
  Administered 2017-07-24: 10 mL

## 2017-07-24 MED ORDER — TRIAMCINOLONE ACETONIDE 40 MG/ML IJ SUSP
40.0000 mg | Freq: Once | INTRAMUSCULAR | Status: AC
  Administered 2017-07-24: 40 mg

## 2017-07-24 MED ORDER — ROPIVACAINE HCL 2 MG/ML IJ SOLN
INTRAMUSCULAR | Status: AC
  Filled 2017-07-24: qty 10

## 2017-07-24 MED ORDER — HYDROXYZINE PAMOATE 25 MG PO CAPS: 25.0000 mg | ORAL_CAPSULE | Freq: Every day | ORAL | 1 refills | Status: DC | PRN

## 2017-07-24 MED ORDER — VILAZODONE HCL 20 MG PO TABS: 20.0000 mg | ORAL_TABLET | Freq: Every day | ORAL | 1 refills | Status: DC

## 2017-07-24 NOTE — Progress Notes (Signed)
Safety precautions to be maintained throughout the outpatient stay will include: orient to surroundings, keep bed in low position, maintain call bell within reach at all times, provide assistance with transfer out of bed and ambulation.  

## 2017-07-24 NOTE — Patient Instructions (Signed)
Doxepin capsules What is this medicine? DOXEPIN (DOX e pin) is used to treat depression and anxiety. This medicine may be used for other purposes; ask your health care provider or pharmacist if you have questions. COMMON BRAND NAME(S): Sinequan What should I tell my health care provider before I take this medicine? They need to know if you have any of these conditions: -bipolar disorder -difficulty passing urine -glaucoma -heart disease -if you frequently drink alcohol containing drinks -liver disease -lung or breathing disease, like asthma or sleep apnea -prostate trouble -schizophrenia -seizures -suicidal thoughts, plans, or attempt; a previous suicide attempt by you or a family member -an unusual or allergic reaction to doxepin, other medicines, foods, dyes, or preservatives -pregnant or trying to get pregnant -breast-feeding How should I use this medicine? Take this medicine by mouth with a glass of water. Follow the directions on the prescription label. Take your doses at regular intervals. Do not take your medicine more often than directed. Do not stop taking this medicine suddenly except upon the advice of your doctor. Stopping this medicine too quickly may cause serious side effects or your condition may worsen. A special MedGuide will be given to you by the pharmacist with each prescription and refill. Be sure to read this information carefully each time. Talk to your pediatrician regarding the use of this medicine in children. While this drug may be prescribed for children as young as 12 years for selected conditions, precautions do apply. Overdosage: If you think you have taken too much of this medicine contact a poison control center or emergency room at once. NOTE: This medicine is only for you. Do not share this medicine with others. What if I miss a dose? If you miss a dose, take it as soon as you can. If it is almost time for your next dose, take only that dose. Do not  take double or extra doses. What may interact with this medicine? Do not take this medicine with any of the following medications: -arsenic trioxide -certain medicines used to regulate abnormal heartbeat or to treat other heart conditions -cisapride -halofantrine -levomethadyl -linezolid -MAOIs like Carbex, Eldepryl, Marplan, Nardil, and Parnate -methylene blue -other medicines for mental depression -phenothiazines like perphenazine, thioridazine and chlorpromazine -pimozide -procarbazine -sparfloxacin -St. John's Wort -ziprasidone This medicine may also interact with the following medications: -cimetidine -tolazamide This list may not describe all possible interactions. Give your health care provider a list of all the medicines, herbs, non-prescription drugs, or dietary supplements you use. Also tell them if you smoke, drink alcohol, or use illegal drugs. Some items may interact with your medicine. What should I watch for while using this medicine? Visit your doctor or health care professional for regular checks on your progress. It can take several days before you feel the full effect of this medicine. If you have been taking this medicine regularly for some time, do not suddenly stop taking it. You must gradually reduce the dose or you may get severe side effects. Ask your doctor or health care professional for advice. Even after you stop taking this medicine it can still affect your body for several days. Patients and their families should watch out for new or worsening thoughts of suicide or depression. Also watch out for sudden changes in feelings such as feeling anxious, agitated, panicky, irritable, hostile, aggressive, impulsive, severely restless, overly excited and hyperactive, or not being able to sleep. If this happens, especially at the beginning of treatment or after a change in dose,   call your health care professional. You may get drowsy or dizzy. Do not drive, use machinery,  or do anything that needs mental alertness until you know how this medicine affects you. Do not stand or sit up quickly, especially if you are an older patient. This reduces the risk of dizzy or fainting spells. Alcohol may increase dizziness and drowsiness. Avoid alcoholic drinks. Do not treat yourself for coughs, colds, or allergies without asking your doctor or health care professional for advice. Some ingredients can increase possible side effects. Your mouth may get dry. Chewing sugarless gum or sucking hard candy, and drinking plenty of water may help. Contact your doctor if the problem does not go away or is severe. This medicine may cause dry eyes and blurred vision. If you wear contact lenses you may feel some discomfort. Lubricating drops may help. See your eye doctor if the problem does not go away or is severe. This medicine can make you more sensitive to the sun. Keep out of the sun. If you cannot avoid being in the sun, wear protective clothing and use sunscreen. Do not use sun lamps or tanning beds/booths. What side effects may I notice from receiving this medicine? Side effects that you should report to your doctor or health care professional as soon as possible: -allergic reactions like skin rash, itching or hives, swelling of the face, lips, or tongue -anxious -breathing problems -changes in vision -confusion -elevated mood, decreased need for sleep, racing thoughts, impulsive behavior -eye pain -fast, irregular heartbeat -feeling faint or lightheaded, falls -feeling agitated, angry, or irritable -fever with increased sweating -hallucination, loss of contact with reality -seizures -stiff muscles -suicidal thoughts or other mood changes -tingling, pain, or numbness in the feet or hands -trouble passing urine or change in the amount of urine -trouble sleeping -unusually weak or tired -vomiting -yellowing of the eyes or skin Side effects that usually do not require medical  attention (report to your doctor or health care professional if they continue or are bothersome): -change in sex drive or performance -change in appetite or weight -constipation -dizziness -dry mouth -nausea -tired -tremors -upset stomach This list may not describe all possible side effects. Call your doctor for medical advice about side effects. You may report side effects to FDA at 1-800-FDA-1088. Where should I keep my medicine? Keep out of the reach of children. Store at room temperature between 15 and 30 degrees C (59 and 86 degrees F). Throw away any unused medicine after the expiration date. NOTE: This sheet is a summary. It may not cover all possible information. If you have questions about this medicine, talk to your doctor, pharmacist, or health care provider.  2018 Elsevier/Gold Standard (2015-10-15 12:35:05)  

## 2017-07-24 NOTE — Patient Instructions (Signed)

## 2017-07-24 NOTE — Progress Notes (Signed)
Patient's Name: Wanda [name]  MRN: [id number]  Referring Provider: Milinda Pointer, MD  DOB: [birth year]  PCP: Gennette Pac, FNP  DOS: 07/24/2017  Note by: Gaspar Cola, MD  Service setting: Ambulatory outpatient  Specialty: Interventional Pain Management  Patient type: Established  Location: [facility] (AMB) Pain Management Facility  Visit type: Interventional Procedure   Primary Reason for Visit: Interventional Pain Management Treatment. CC: Hip Pain (right)  Procedure:       Anesthesia, Analgesia, Anxiolysis:  Type: Trigger Point Injection (1-2 muscle groups) CPT: 20552 Primary Purpose: Diagnostic/therapeutic Region: Posterior Lumbar Level: PSIS Target Area: Erector spina (ES) muscle and quadratus lumborum (QL) muscle. Approach: Ipsilateral approach. Laterality: Right-Sided Paraspinal Position: Sitting  Type: Local Anesthesia Local Anesthetic: Lidocaine 1% Route: Infiltration ([place]/IM) IV Access: Declined Sedation: Declined  Indication(s): Analgesia           Indications: 1. Trigger point with back pain   2. Chronic musculoskeletal pain   3. Chronic low back pain (Primary Source of Pain) (Bilateral) (R>L)    Pain Score: Pre-procedure: 8 /10 Post-procedure: 5 /10  Pre-op Assessment:  [name] is a 62 y.o. (year old), female patient, seen today for interventional treatment. She  has a past surgical history that includes Shoulder surgery; Appendectomy; Abdominal hysterectomy; Back surgery; Spinal cord stimulator; Colonoscopy (N/A, 08/28/2013); Breast excisional biopsy (Right); and Intramedullary (im) nail intertrochanteric (Left, 05/30/2016). [name] has a current medication list which includes the following prescription(s): alendronate, aspirin ec, atorvastatin, vitamin d3, docusate sodium, hydroxyzine, multivitamin, pantoprazole, polyethylene glycol, and viibryd. Her primarily concern today is the Hip Pain (right)  Initial Vital Signs:  Pulse Rate: 87 Temp: 98.3 F  (36.8 C) Resp: 16 BP: 139/83 SpO2: 97 %  BMI: Estimated body mass index is 27.96 kg/m as calculated from the following:   Height as of this encounter: 5\' 1"  (1.549 m).   Weight as of this encounter: 148 lb (67.1 kg).  Risk Assessment: Allergies: Reviewed. She is allergic to levofloxacin; mobic [meloxicam]; propoxyphene hcl; tramadol; and codeine.  Allergy Precautions: None required Coagulopathies: Reviewed. None identified.  Blood-thinner therapy: None at this time Active Infection(s): Reviewed. None identified. [name] is afebrile  Site Confirmation: [name] was asked to confirm the procedure and laterality before marking the site Procedure checklist: Completed Consent: Before the procedure and under the influence of no sedative(s), amnesic(s), or anxiolytics, the patient was informed of the treatment options, risks and possible complications. To fulfill our ethical and legal obligations, as recommended by the American Medical Association's Code of Ethics, I have informed the patient of my clinical impression; the nature and purpose of the treatment or procedure; the risks, benefits, and possible complications of the intervention; the alternatives, including doing nothing; the risk(s) and benefit(s) of the alternative treatment(s) or procedure(s); and the risk(s) and benefit(s) of doing nothing. The patient was provided information about the general risks and possible complications associated with the procedure. These may include, but are not limited to: failure to achieve desired goals, infection, bleeding, organ or nerve damage, allergic reactions, paralysis, and death. In addition, the patient was informed of those risks and complications associated to the procedure, such as failure to decrease pain; infection; bleeding; organ or nerve damage with subsequent damage to sensory, motor, and/or autonomic systems, resulting in permanent pain, numbness, and/or weakness of one or several  areas of the body; allergic reactions; (i.e.: anaphylactic reaction); and/or death. Furthermore, the patient was informed of those risks and complications associated with the medications. These  include, but are not limited to: allergic reactions (i.e.: anaphylactic or anaphylactoid reaction(s)); adrenal axis suppression; blood sugar elevation that in diabetics may result in ketoacidosis or comma; water retention that in patients with history of congestive heart failure may result in shortness of breath, pulmonary edema, and decompensation with resultant heart failure; weight gain; swelling or edema; medication-induced neural toxicity; particulate matter embolism and blood vessel occlusion with resultant organ, and/or nervous system infarction; and/or aseptic necrosis of one or more joints. Finally, the patient was informed that Medicine is not an exact science; therefore, there is also the possibility of unforeseen or unpredictable risks and/or possible complications that may result in a catastrophic outcome. The patient indicated having understood very clearly. We have given the patient no guarantees and we have made no promises. Enough time was given to the patient to ask questions, all of which were answered to the patient's satisfaction. [name] has indicated that she wanted to continue with the procedure. Attestation: I, the ordering provider, attest that I have discussed with the patient the benefits, risks, side-effects, alternatives, likelihood of achieving goals, and potential problems during recovery for the procedure that I have provided informed consent. Date  Time: 07/24/2017 11:23 AM  Pre-Procedure Preparation:  Monitoring: As per clinic protocol. Respiration, ETCO2, SpO2, BP, heart rate and rhythm monitor placed and checked for adequate function Safety Precautions: Patient was assessed for positional comfort and pressure points before starting the procedure. Time-out: I initiated and  conducted the "Time-out" before starting the procedure, as per protocol. The patient was asked to participate by confirming the accuracy of the "Time Out" information. Verification of the correct person, site, and procedure were performed and confirmed by me, the nursing staff, and the patient. "Time-out" conducted as per Joint Commission's Universal Protocol (UP.01.01.01). Time: 1154  Description of Procedure Process:   Area Prepped: Entire Posterior Lumbosacral Region Prepping solution: ChloraPrep (2% chlorhexidine gluconate and 70% isopropyl alcohol) Safety Precautions: Aspiration looking for blood return was conducted prior to all injections. At no point did we inject any substances, as a needle was being advanced. No attempts were made at seeking any paresthesias. Safe injection practices and needle disposal techniques used. Medications properly checked for expiration dates. SDV (single dose vial) medications used. Description of the Procedure: Protocol guidelines were followed. The patient was placed in position over the fluoroscopy table. The target area was identified and the area prepped in the usual manner. Skin desensitized using vapocoolant spray. Skin & deeper tissues infiltrated with local anesthetic. Appropriate amount of time allowed to pass for local anesthetics to take effect. The procedure needles were then advanced to the target area. Proper needle placement secured. Negative aspiration confirmed. Solution injected in intermittent fashion, asking for systemic symptoms every 0.5cc of injectate. The needles were then removed and the area cleansed, making sure to leave some of the prepping solution back to take advantage of its long term bactericidal properties. Vitals:   07/24/17 1121 07/24/17 1157  BP: 139/83 140/87  Pulse: 87 (!) 105  Resp: 16 18  Temp: 98.3 F (36.8 C)   TempSrc: Oral   SpO2: 97%   Weight: 148 lb (67.1 kg)   Height: 5\' 1"  (1.549 m)     Start Time: 1154  hrs. End Time: 1155 hrs. Materials:  Needle(s) Type: Epidural needle Gauge: 25G Length: 1.5-in Medication(s): Please see orders for medications and dosing details.  Imaging Guidance:  Type of Imaging Technique: None used Indication(s): N/A Exposure Time: No patient exposure Contrast:  None used. Fluoroscopic Guidance: N/A Ultrasound Guidance: N/A Interpretation: N/A  Antibiotic Prophylaxis:   Anti-infectives (From admission, onward)   None     Indication(s): None identified  Post-operative Assessment:  Post-procedure Vital Signs:  Pulse Rate: (!) 105 Temp: 98.3 F (36.8 C) Resp: 18 BP: 140/87 SpO2: 97 %  EBL: None  Complications: No immediate post-treatment complications observed by team, or reported by patient.  Note: The patient tolerated the entire procedure well. A repeat set of vitals were taken after the procedure and the patient was kept under observation following institutional policy, for this type of procedure. Post-procedural neurological assessment was performed, showing return to baseline, prior to discharge. The patient was provided with post-procedure discharge instructions, including a section on how to identify potential problems. Should any problems arise concerning this procedure, the patient was given instructions to immediately contact us, at any time, without hesitation. In any case, we plan to contact the patient by telephone for a follow-up status report regarding this interventional procedure.  Comments:  No additional relevant information.  Plan of Care   Imaging Orders  No imaging studies ordered today    Procedure Orders     TRIGGER POINT INJECTION  Medications ordered for procedure: Meds ordered this encounter  Medications  . triamcinolone acetonide (KENALOG-40) injection 40 mg  . lidocaine (XYLOCAINE) 2 % (with pres) injection 400 mg  . ropivacaine (PF) 2 mg/mL (0.2%) (NAROPIN) injection 9 mL   Medications administered: We  administered triamcinolone acetonide, lidocaine, and ropivacaine (PF) 2 mg/mL (0.2%).  See the medical record for exact dosing, route, and time of administration.  New Prescriptions   No medications on file   Disposition: Discharge home  Discharge Date & Time: 07/24/2017; 1200 hrs.   Physician-requested Follow-up: Return for post-procedure eval (2 wks), w/ Dionisio David, NP.  Future Appointments  Date Time Provider [place]  07/24/2017  2:30 PM Ursula Alert, MD [facility] None  08/07/2017  8:30 AM Milinda Pointer, MD [facility] None   Primary Care Physician: Gennette Pac, FNP Location: [facility] Note by: Gaspar Cola, MD Date: 07/24/2017; Time: 1:06 PM  Disclaimer:  Medicine is not an Chief Strategy Officer. The only guarantee in medicine is that nothing is guaranteed. It is important to note that the decision to proceed with this intervention was based on the information collected from the patient. The Data and conclusions were drawn from the patient's questionnaire, the interview, and the physical examination. Because the information was provided in large part by the patient, it cannot be guaranteed that it has not been purposely or unconsciously manipulated. Every effort has been made to obtain as much relevant data as possible for this evaluation. It is important to note that the conclusions that lead to this procedure are derived in large part from the available data. Always take into account that the treatment will also be dependent on availability of resources and existing treatment guidelines, considered by other Pain Management Practitioners as being common knowledge and practice, at the time of the intervention. For Medico-Legal purposes, it is also important to point out that variation in procedural techniques and pharmacological choices are the acceptable norm. The indications, contraindications, technique, and results of the above procedure  should only be interpreted and judged by a Board-Certified Interventional Pain Specialist with extensive familiarity and expertise in the same exact procedure and technique.
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
## 2017-07-25 ENCOUNTER — Encounter: Payer: Self-pay | Admitting: Psychiatry

## 2017-07-25 ENCOUNTER — Telehealth: Payer: Self-pay | Admitting: *Deleted

## 2017-07-25 NOTE — Telephone Encounter (Signed)
LVM

## 2017-08-07 ENCOUNTER — Ambulatory Visit: Payer: Medicare HMO | Admitting: Pain Medicine

## 2017-08-20 ENCOUNTER — Ambulatory Visit: Payer: Medicare HMO | Admitting: Licensed Clinical Social Worker

## 2017-08-21 ENCOUNTER — Encounter: Payer: Self-pay | Admitting: Psychiatry

## 2017-08-21 ENCOUNTER — Other Ambulatory Visit: Payer: Self-pay

## 2017-08-21 ENCOUNTER — Ambulatory Visit: Payer: Medicare HMO | Admitting: Psychiatry

## 2017-08-21 VITALS — BP 117/76 | HR 106 | Temp 98.4°F | Wt 145.8 lb

## 2017-08-21 DIAGNOSIS — F4010 Social phobia, unspecified: Secondary | ICD-10-CM | POA: Diagnosis not present

## 2017-08-21 DIAGNOSIS — F331 Major depressive disorder, recurrent, moderate: Secondary | ICD-10-CM | POA: Diagnosis not present

## 2017-08-21 DIAGNOSIS — F411 Generalized anxiety disorder: Secondary | ICD-10-CM | POA: Diagnosis not present
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
## 2017-08-22 ENCOUNTER — Encounter: Payer: Self-pay | Admitting: Psychiatry

## 2017-10-02 ENCOUNTER — Other Ambulatory Visit: Payer: Self-pay | Admitting: Psychiatry

## 2017-10-02 DIAGNOSIS — F411 Generalized anxiety disorder: Secondary | ICD-10-CM

## 2017-10-04 ENCOUNTER — Ambulatory Visit: Payer: Medicare HMO | Admitting: Internal Medicine

## 2017-10-04 ENCOUNTER — Other Ambulatory Visit
Admission: RE | Admit: 2017-10-04 | Discharge: 2017-10-04 | Disposition: A | Discharge status: Home / Self Care | Payer: Medicare HMO | Source: Ambulatory Visit | Attending: Internal Medicine | Admitting: Internal Medicine

## 2017-10-04 ENCOUNTER — Encounter: Payer: Self-pay | Admitting: Internal Medicine

## 2017-10-04 VITALS — BP 110/75 | HR 109 | Ht 61.0 in | Wt 145.5 lb

## 2017-10-04 DIAGNOSIS — R000 Tachycardia, unspecified: Secondary | ICD-10-CM

## 2017-10-04 DIAGNOSIS — R42 Dizziness and giddiness: Secondary | ICD-10-CM | POA: Insufficient documentation

## 2017-10-04 DIAGNOSIS — E782 Mixed hyperlipidemia: Secondary | ICD-10-CM | POA: Diagnosis not present

## 2017-10-04 LAB — BASIC METABOLIC PANEL
ANION GAP: 9 (ref 5–15)
BUN: 8 mg/dL (ref 6–20)
CALCIUM: 9.7 mg/dL (ref 8.9–10.3)
CHLORIDE: 102 mmol/L (ref 101–111)
CO2: 27 mmol/L (ref 22–32)
Creatinine, Ser: 0.59 mg/dL (ref 0.44–1.00)
GFR calc Af Amer: >60 mL/min (ref >60)
GFR calc non Af Amer: >60 mL/min (ref >60)
GLUCOSE: 98 mg/dL (ref 65–99)
Potassium: 3.6 mmol/L (ref 3.5–5.1)
Sodium: 138 mmol/L (ref 135–145)

## 2017-10-04 NOTE — Progress Notes (Signed)
Follow-up Outpatient Visit Date: 10/04/2017  Primary Care Provider: Gennette Pac, [place] [address]  Chief Complaint: Follow-up orthostatic hypotension  HPI:  [name] is a 62 y.o. year-old female with history of orthostatic hypotension, hyperlipidemia, and irregular heart beat, who presents for follow-up of orthostatic hypotension.  I last saw Ms. Brickel in November, at which time she continued to have orthostatic lightheadedness/hypotension that proved refractory to aggressive fluid and sodium repletion as well as midodrine and fludrocortisone.  She was seen by Dr. Caryl Comes later that month, but no further testing or intervention was recommended.  Since our last visit, Ms. Bromell feels like her lightheadedness has improved.  She has not fallen or passed out.  She denies chest pain, shortness of breath, and palpitations, though she notes that her heart rate remains high.  She began taking KCl 10 mEq daily on her own and continues to drink lots of water and Gatorade throughout the day.  She is no longer wearing compression stockings, as they did not seem to help her symptoms.  --------------------------------------------------------------------------------------------------  Cardiovascular History & Procedures: Cardiovascular Problems:  Orthostatic lightheadedness/fall   Risk Factors:  Hyperlipidemia  Cath/PCI:  None  CV Surgery:  None  EP Procedures and Devices:  Cardiac event monitor (06/28/16): Predominantly sinus rhythm with rare PACs and PVCs, as well as paroxysmal SVT (lasting up to 10.8 seconds).  Non-Invasive Evaluation(s):  Pharmacologic MPI (09/29/16): Low risk study without ischemia or scar. Nonspecific ST depression noted with regular dentist sent. LVEF 55-65%.  Bilateral carotid Doppler (05/31/16): Mild bilateral carotid atherosclerosis (<50%). Antegrade vertebral artery flow bilaterally.  TTE (05/29/16): Technically difficult  study. Normal LV size and function (LVEF 60-65%). No significant valvular abnormalities (though suboptimally visualized). Normal RV size and function.  Recent CV Pertinent Labs: Lab Results  Component Value Date   CHOL 206 (H) 09/29/2016   HDL 41 09/29/2016   LDLCALC 106 (H) 09/29/2016   LDLDIRECT 197.3 02/01/2010   TRIG 296 (H) 09/29/2016   CHOLHDL 5.0 09/29/2016   K 3.6 10/04/2017   K 3.8 12/04/2012   MG 2.2 02/06/2017   BUN 8 10/04/2017   BUN 9 04/26/2017   BUN 16 12/04/2012   CREATININE 0.59 10/04/2017   CREATININE 0.64 12/04/2012    Past medical and surgical history were reviewed and updated in EPIC.  Current Meds  Medication Sig  . aspirin EC 81 MG tablet Take 81 mg by mouth daily.  Marland Kitchen atorvastatin (LIPITOR) 40 MG tablet Take 40 mg by mouth daily at 6 (six) AM.   . calcium carbonate (TUMS - DOSED IN MG ELEMENTAL CALCIUM) 500 MG chewable tablet Chew 1 tablet by mouth daily.  . Cholecalciferol (VITAMIN D3) 5000 units TABS Take 5,000 Units by mouth daily.  . hydrOXYzine (VISTARIL) 25 MG capsule TAKE 1-2 CAPSULES BY MOUTH DAILY AS NEEDED FOR ANXIETY  . Multiple Vitamin (MULTIVITAMIN) tablet Take 1 tablet by mouth daily.  . pantoprazole (PROTONIX) 40 MG tablet Take 40 mg by mouth 2 (two) times daily.   . potassium chloride SA (K-DUR,KLOR-CON) 20 MEQ tablet Take 10 mEq by mouth 2 (two) times daily.  . Vilazodone HCl (VIIBRYD) 20 MG TABS Take 1 tablet (20 mg total) by mouth daily.  . [DISCONTINUED] calcium acetate (PHOSLO) 667 MG capsule Take 667 mg by mouth daily.    Allergies: Levofloxacin; Mobic [meloxicam]; Propoxyphene hcl; Tramadol; and Codeine  Social History   Tobacco Use  . Smoking status: Former Smoker    Packs/day: 0.25  Years: 14.00    Pack years: 3.50    Types: Cigarettes    Last attempt to quit: 1992    Years since quitting: 27.3  . Smokeless tobacco: Former Systems developer    Quit date: 08/29/1990  Substance Use Topics  . Alcohol use: Yes    Comment:  occasional; once every 6 months  . Drug use: No    Family History  Problem Relation Age of Onset  . Diabetes Mother   . COPD Mother   . Heart disease Mother   . Diabetes Father   . COPD Father   . Heart disease Father   . Heart attack Father 56       CABG  . Alcohol abuse Daughter   . Depression Daughter   . Drug abuse Daughter   . Anxiety disorder Daughter   . Tuberculosis Maternal Aunt   . Cancer Paternal Aunt        breast  . Breast cancer Paternal Aunt   . Diabetes Paternal Grandmother   . Diabetes Sister   . Cancer Cousin   . Diabetes Cousin   . Colon cancer Neg Hx   . Sudden Cardiac Death Neg Hx     Review of Systems: A 12-system review of systems was performed and was negative except as noted in the HPI.  --------------------------------------------------------------------------------------------------  Physical Exam: BP 110/75 (BP Location: Right Arm, Patient Position: Sitting, Cuff Size: Normal)   Pulse (!) 109   Ht 5\' 1"  (1.549 m)   Wt 145 lb 8 oz (66 kg)   LMP 09/30/1991 (Approximate) Comment: 1993  BMI 27.49 kg/m   General:  NAD HEENT: No conjunctival pallor or scleral icterus. Moist mucous membranes.  OP clear. Neck: Supple without lymphadenopathy, thyromegaly, JVD, or HJR.  Lungs: Normal work of breathing. Clear to auscultation bilaterally without wheezes or crackles. Heart:Tachycardic but regular without murmurs, rubs, or gallops. Non-displaced PMI. Abd: Bowel sounds present. Soft, NT/ND without hepatosplenomegaly Ext: No lower extremity edema. Radial, PT, and DP pulses are 2+ bilaterally. Skin: Warm and dry without rash.  EKG:  Sinus tachycardia (HR 109 bpm).  Otherwise, no significant abnormalities.  Lab Results  Component Value Date   WBC 5.6 02/06/2017   HGB 12.9 02/06/2017   HCT 38.4 02/06/2017   MCV 97 02/06/2017   PLT 232 02/06/2017    Lab Results  Component Value Date   NA 138 10/04/2017   K 3.6 10/04/2017   CL 102 10/04/2017     CO2 27 10/04/2017   BUN 8 10/04/2017   CREATININE 0.59 10/04/2017   GLUCOSE 98 10/04/2017   ALT 29 08/09/2016    Lab Results  Component Value Date   CHOL 206 (H) 09/29/2016   HDL 41 09/29/2016   LDLCALC 106 (H) 09/29/2016   LDLDIRECT 197.3 02/01/2010   TRIG 296 (H) 09/29/2016   CHOLHDL 5.0 09/29/2016    --------------------------------------------------------------------------------------------------  ASSESSMENT AND PLAN: Orthostatic lightheadedness Symptoms improved since our last visit.  Etiology for orthostatic hypotension coupled with sinus tachycardia and hypokalemia remains uncertain despite extensive evaluation and consultations with EP, nephrology, and endocrinology.  Given that symptoms are minimal at this time, we will defer additional workup/interventions at this time.  Of note, Ms. Luckadoo did not have any appreciable benefit from fludrocortisone and midodrine.  Sinus tachycardia Longstanding and stable.  Given soft blood pressures, I will defer adding a beta blocker or calcium channel blocker.  If she becomes symptomatic, we could explore ivabradine in the future.  Hyperlipidemia Continue atorvastatin.  Further follow-up and management per Ms. Scharlene Gloss PCP.  Follow-up: Return to clinic in 6 months.  Nelva Bush, MD 10/05/2017 9:27 AM

## 2017-10-04 NOTE — Patient Instructions (Signed)
Medication Instructions:  Your physician recommends that you continue on your current medications as directed. Please refer to the Current Medication list given to you today.   Labwork: Bmet today  Testing/Procedures: Non ordered  Follow-Up: Your physician recommends that you schedule a follow-up appointment in: 6 months with Dr.End   Any Other Special Instructions Will Be Listed Below (If Applicable).     If you need a refill on your cardiac medications before your next appointment, please call your pharmacy.

## 2017-10-05 ENCOUNTER — Encounter: Payer: Self-pay | Admitting: Internal Medicine

## 2017-10-18 ENCOUNTER — Ambulatory Visit: Payer: Medicare HMO | Admitting: Psychiatry

## 2017-10-18 ENCOUNTER — Other Ambulatory Visit: Payer: Self-pay

## 2017-10-18 ENCOUNTER — Encounter: Payer: Self-pay | Admitting: Psychiatry

## 2017-10-18 VITALS — BP 124/83 | HR 112 | Temp 99.4°F | Wt 147.0 lb

## 2017-10-18 DIAGNOSIS — F4010 Social phobia, unspecified: Secondary | ICD-10-CM | POA: Diagnosis not present

## 2017-10-18 DIAGNOSIS — F331 Major depressive disorder, recurrent, moderate: Secondary | ICD-10-CM

## 2017-10-18 DIAGNOSIS — F411 Generalized anxiety disorder: Secondary | ICD-10-CM | POA: Diagnosis not present

## 2017-10-18 MED ORDER — VILAZODONE HCL 20 MG PO TABS: 20.0000 mg | ORAL_TABLET | Freq: Every day | ORAL | 2 refills | Status: DC

## 2017-10-18 MED ORDER — HYDROXYZINE PAMOATE 25 MG PO CAPS: 25.0000 mg | ORAL_CAPSULE | Freq: Every evening | ORAL | 0 refills | Status: DC | PRN
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
## 2017-10-19 ENCOUNTER — Encounter: Payer: Self-pay | Admitting: Psychiatry

## 2017-11-22 ENCOUNTER — Other Ambulatory Visit: Payer: Self-pay

## 2017-11-22 ENCOUNTER — Ambulatory Visit: Payer: Medicare HMO | Admitting: Psychiatry

## 2017-11-22 ENCOUNTER — Encounter: Payer: Self-pay | Admitting: Psychiatry

## 2017-11-22 VITALS — BP 119/86 | HR 130 | Temp 98.5°F | Wt 148.2 lb

## 2017-11-22 DIAGNOSIS — F331 Major depressive disorder, recurrent, moderate: Secondary | ICD-10-CM | POA: Diagnosis not present

## 2017-11-22 DIAGNOSIS — F411 Generalized anxiety disorder: Secondary | ICD-10-CM

## 2017-11-22 DIAGNOSIS — F4010 Social phobia, unspecified: Secondary | ICD-10-CM

## 2017-11-22 MED ORDER — MIRTAZAPINE 15 MG PO TABS: 7.5000 mg | ORAL_TABLET | Freq: Every day | ORAL | 2 refills | Status: DC

## 2017-11-22 MED ORDER — VILAZODONE HCL 20 MG PO TABS: 30.0000 mg | ORAL_TABLET | Freq: Every day | ORAL | 2 refills | Status: DC
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
## 2017-11-23 ENCOUNTER — Encounter: Payer: Self-pay | Admitting: Psychiatry

## 2017-12-05 ENCOUNTER — Other Ambulatory Visit: Payer: Self-pay | Admitting: Family Medicine

## 2017-12-05 DIAGNOSIS — Z1231 Encounter for screening mammogram for malignant neoplasm of breast: Secondary | ICD-10-CM

## 2018-01-10 ENCOUNTER — Other Ambulatory Visit: Payer: Self-pay

## 2018-01-10 ENCOUNTER — Ambulatory Visit (INDEPENDENT_AMBULATORY_CARE_PROVIDER_SITE_OTHER): Payer: Medicare HMO | Admitting: Psychiatry

## 2018-01-10 ENCOUNTER — Encounter: Payer: Self-pay | Admitting: Psychiatry

## 2018-01-10 VITALS — BP 117/81 | HR 121 | Temp 98.3°F

## 2018-01-10 DIAGNOSIS — F4010 Social phobia, unspecified: Secondary | ICD-10-CM

## 2018-01-10 DIAGNOSIS — F411 Generalized anxiety disorder: Secondary | ICD-10-CM | POA: Diagnosis not present

## 2018-01-10 DIAGNOSIS — F331 Major depressive disorder, recurrent, moderate: Secondary | ICD-10-CM

## 2018-01-10 MED ORDER — HYDROXYZINE PAMOATE 25 MG PO CAPS: 25.0000 mg | ORAL_CAPSULE | Freq: Three times a day (TID) | ORAL | 1 refills | Status: DC | PRN

## 2018-01-10 MED ORDER — VILAZODONE HCL 40 MG PO TABS: 40.0000 mg | ORAL_TABLET | Freq: Every day | ORAL | 1 refills | Status: DC

## 2018-01-10 NOTE — Progress Notes (Signed)
Jerm[facility] MD OP Progress Note  01/10/2018 3:17 PM Wanda [name]  MRN:  [id number]  Chief Complaint: ' I am here for follow up." Chief Complaint    Follow-up; Medication Refill     HPI: Wanda [name] is a 62 year old Caucasian female, widowed, on SSD, lives in [place], has a history of depression, anxiety, SVT, orthostatic hypotension, hyperlipidemia, chronic pain, presented to the clinic today for a follow-up visit.  Patient today reports she has seen some improvement in her mood but she would like to feel better.  She would like her Viibryd medication to be readjusted if possible.  Patient reports sleep continues to be restless.  She however reports she would like to stay on the Remeron.  She would like her hydroxyzine to be increased and spread out if possible to see if that will help her to feel less anxious at night.  Patient reports she is moving into a new apartment soon and she believes that will help her tremendously.  She also reports she continues to have the psychosocial stressors of her grandson who is having some legal issues.  She however reports she is happy that he is going to start anger management classes since it is court mandated.  Patient reports she can start psychotherapy at [facility] since she was told they have a new therapist there.  Patient denies any suicidality.  Patient denies any perceptual disturbances.  Patient denies any side effects to the medications.  Visit Diagnosis:    ICD-10-CM   1. MDD (major depressive disorder), recurrent episode, moderate (HCC) F33.1 Vilazodone HCl (VIIBRYD) 40 MG TABS  2. GAD (generalized anxiety disorder) F41.1 Vilazodone HCl (VIIBRYD) 40 MG TABS    hydrOXYzine (VISTARIL) 25 MG capsule  3. Social anxiety disorder F40.10     Past Psychiatric History: Past psychiatric history from my progress note on 08/21/2017.  Past trials of Zoloft, Cymbalta, Effexor, Paxil, Prozac, bupropion, BuSpar, alprazolam, prazosin, Seroquel,  trazodone, doxepin.  Past Medical History:  Past Medical History:  Diagnosis Date  . Anxiety   . Borderline diabetes   . BRONCHITIS, ACUTE WITH BRONCHOSPASM 06/25/2010   Qualifier: Diagnosis of  By: Alveta Heimlich MD, Cornelia Copa    . Chronic prescription benzodiazepine use 10/18/2015  . Depression   . GASTROENTERITIS WITHOUT DEHYDRATION 05/14/2009   Qualifier: Diagnosis of  By: Deborra Medina MD, Tanja Port    . GERD (gastroesophageal reflux disease)   . Herpes   . 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 Hyperlipidemia   . Irregular heart beat   . Sinus tachycardia 04/11/2017  . 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 Dossie Arbour Serial number: [id number] Model number: [id number]     Past Surgical History:  Procedure Laterality Date  . ABDOMINAL HYSTERECTOMY    . APPENDECTOMY    . BACK SURGERY     X 3  . BREAST EXCISIONAL BIOPSY Right    surgical bx age 42   . COLONOSCOPY N/A 08/28/2013   Procedure: COLONOSCOPY;  Surgeon: Danie Binder, MD;  [facility];  Service: Endoscopy;  Laterality: N/A;  8:30 AM  . INTRAMEDULLARY (IM) NAIL INTERTROCHANTERIC Left 05/30/2016   Procedure: INTRAMEDULLARY (IM) NAIL INTERTROCHANTRIC;  Surgeon: Thornton Park, MD;  Location: [facility] ORS;  Service: Orthopedics;  Laterality: Left;  . SHOULDER SURGERY    . Spinal cord stimulator      Family Psychiatric History: Have reviewed family psychiatric history from my progress  note on 08/21/2017.  Family History:  Family History  Problem Relation Age of Onset  . Diabetes Mother   . COPD Mother   . Heart disease Mother   . Diabetes Father   . COPD Father   . Heart disease Father   . Heart attack Father 67       CABG  . Alcohol abuse Daughter   . Depression Daughter   . Drug abuse Daughter   . Anxiety disorder Daughter   . Tuberculosis Maternal Aunt   . Cancer Paternal Aunt        breast  . Breast cancer Paternal  Aunt   . Diabetes Paternal Grandmother   . Diabetes Sister   . Cancer Cousin   . Diabetes Cousin   . Colon cancer Neg Hx   . Sudden Cardiac Death Neg Hx     Social History: Have reviewed social history from my progress note on 08/21/2017. Social History   Socioeconomic History  . Marital status: Divorced    Spouse name: Not on file  . Number of children: 2  . Years of education: Not on file  . Highest education level: High school graduate  Occupational History    Comment: disabled  Social Needs  . Financial resource strain: Somewhat hard  . Food insecurity:    Worry: Never true    Inability: Never true  . Transportation needs:    Medical: No    Non-medical: No  Tobacco Use  . Smoking status: Former Smoker    Packs/day: 0.25    Years: 14.00    Pack years: 3.50    Types: Cigarettes    Last attempt to quit: 1992    Years since quitting: 27.6  . Smokeless tobacco: Former Systems developer    Quit date: 08/29/1990  Substance and Sexual Activity  . Alcohol use: Yes    Comment: occasional; once every 6 months  . Drug use: No  . Sexual activity: Yes    Birth control/protection: Surgical  Lifestyle  . Physical activity:    Days per week: 0 days    Minutes per session: 0 min  . Stress: Very much  Relationships  . Social connections:    Talks on phone: Twice a week    Gets together: Never    Attends religious service: More than 4 times per year    Active member of club or organization: Yes    Attends meetings of clubs or organizations: Never    Relationship status: Divorced  Other Topics Concern  . Not on file  Social History Narrative  . Not on file    Allergies:  Allergies  Allergen Reactions  . Levofloxacin     REACTION: N \\T \ V  . Mobic [Meloxicam] Other (See Comments)    Calf tightness  . Propoxyphene Hcl     REACTION: N \\T \ V  . Tramadol Other (See Comments)    Severe headache  . Codeine Nausea Only    Slight nausea    Metabolic Disorder Labs: Lab Results   Component Value Date   HGBA1C 5.4 05/29/2016   MPG 108 05/29/2016   No results found for: PROLACTIN Lab Results  Component Value Date   CHOL 206 (H) 09/29/2016   TRIG 296 (H) 09/29/2016   HDL 41 09/29/2016   CHOLHDL 5.0 09/29/2016   VLDL 59 (H) 09/29/2016   LDLCALC 106 (H) 09/29/2016   LDLCALC 167 06/29/2009   LDLCALC 167 06/29/2009   Lab Results  Component Value Date  TSH 2.050 10/18/2016   TSH 1.330 08/09/2016    Therapeutic Level Labs: No results found for: LITHIUM No results found for: VALPROATE No components found for:  CBMZ  Current Medications: Current Outpatient Medications  Medication Sig Dispense Refill  . aspirin EC 81 MG tablet Take 81 mg by mouth daily.    Marland Kitchen atorvastatin (LIPITOR) 40 MG tablet Take 40 mg by mouth daily at 6 (six) AM.     . calcium carbonate (TUMS - DOSED IN MG ELEMENTAL CALCIUM) 500 MG chewable tablet Chew 1 tablet by mouth daily.    . Cholecalciferol (VITAMIN D3) 5000 units TABS Take 5,000 Units by mouth daily.    . hydrOXYzine (VISTARIL) 25 MG capsule Take 1 capsule (25 mg total) by mouth 3 (three) times daily as needed for anxiety. 90 capsule 1  . mirtazapine (REMERON) 15 MG tablet Take 0.5-1 tablets (7.5-15 mg total) by mouth at bedtime. FOR SLEEP 30 tablet 2  . Multiple Vitamin (MULTIVITAMIN) tablet Take 1 tablet by mouth daily.    . pantoprazole (PROTONIX) 40 MG tablet Take 40 mg by mouth 2 (two) times daily.     . potassium chloride SA (K-DUR,KLOR-CON) 20 MEQ tablet Take 10 mEq by mouth 2 (two) times daily.    . Vilazodone HCl (VIIBRYD) 40 MG TABS Take 1 tablet (40 mg total) by mouth daily. 30 tablet 1   No current facility-administered medications for this visit.      Musculoskeletal: Strength & Muscle Tone: within normal limits Gait & Station: normal Patient leans: N/A  Psychiatric Specialty Exam: Review of Systems  Psychiatric/Behavioral: Positive for depression. The patient is nervous/anxious and has insomnia.   All  other systems reviewed and are negative.   Blood pressure 117/81, pulse (!) 121, temperature 98.3 F (36.8 C), temperature source Oral, last menstrual period 09/30/1991.There is no height or weight on file to calculate BMI.  General Appearance: Casual  Eye Contact:  Fair  Speech:  Clear and Coherent  Volume:  Normal  Mood:  Anxious and Dysphoric  Affect:  Congruent  Thought Process:  Goal Directed and Descriptions of Associations: Intact  Orientation:  Full (Time, Place, and Person)  Thought Content: Logical   Suicidal Thoughts:  No  Homicidal Thoughts:  No  Memory:  Immediate;   Fair Recent;   Fair Remote;   Fair  Judgement:  Fair  Insight:  Fair  Psychomotor Activity:  Normal  Concentration:  Concentration: Fair and Attention Span: Fair  Recall:  [employer] of Knowledge: Fair  Language: Fair  Akathisia:  No  Handed:  Right  AIMS (if indicated): na  Assets:  Communication Skills Desire for Improvement Housing  ADL's:  Intact  Cognition: WNL  Sleep:  restless   Screenings: PHQ2-9     Procedure visit from 07/24/2017 in [place] Office Visit from 07/16/2017 in [place] Procedure visit from 06/28/2017 in [place] Office Visit from 03/21/2017 in [place] Procedure visit from 02/20/2017 in [place]  PHQ-2 Total Score  0  0  0  0  0       Assessment and Plan: Wanda [name] is a 62 year old Caucasian female who is widowed, on SSD, lives in [place], has a history of depression, anxiety, sleep issues, chronic pain, multiple medical issues including SVT, presented to the clinic today for a follow-up visit.  Patient  reports some recent psychosocial stressors of her grandson having legal issues.  She however is compliant on her medications and is agreeable to  making medication changes.  We will continue plan as noted below.  Plan For depression Increase Viibryd to 40 mg p.o. Daily  Anxiety symptoms Viibryd 40 mg p.o. daily Change hydroxyzine to 25 mg p.o. 3 times daily as needed  Insomnia Continue Remeron 7.5-15 mg p.o. nightly.  Patient to start therapy at [facility]  Follow-up in clinic in 1 month.  More than 50 % of the time was spent for psychoeducation and supportive psychotherapy and care coordination.  This note was generated in part or whole with voice recognition software. Voice recognition is usually quite accurate but there are transcription errors that can and very often do occur. I apologize for any typographical errors that were not detected and corrected.       Ursula Alert, MD 01/10/2018, 3:17 PM

## 2018-02-01 ENCOUNTER — Other Ambulatory Visit: Payer: Self-pay | Admitting: Psychiatry

## 2018-02-02 ENCOUNTER — Other Ambulatory Visit: Payer: Self-pay

## 2018-02-02 ENCOUNTER — Ambulatory Visit (INDEPENDENT_AMBULATORY_CARE_PROVIDER_SITE_OTHER): Payer: Medicare HMO

## 2018-02-02 ENCOUNTER — Ambulatory Visit
Admission: EM | Admit: 2018-02-02 | Discharge: 2018-02-02 | Disposition: A | Discharge status: Home / Self Care | Payer: Medicare HMO | Attending: Family Medicine | Admitting: Family Medicine

## 2018-02-02 DIAGNOSIS — M79671 Pain in right foot: Secondary | ICD-10-CM

## 2018-02-02 MED ORDER — NAPROXEN 500 MG PO TABS: 500.0000 mg | ORAL_TABLET | Freq: Two times a day (BID) | ORAL | 0 refills | Status: DC | PRN

## 2018-02-02 NOTE — ED Provider Notes (Signed)
[facility]    CSN: [id number] Arrival date & time: 02/02/18  0946  History   Chief Complaint Chief Complaint  Patient presents with  . Foot Pain   HPI  62 year old female presents with right foot pain.  Patient reports a one-week history of right foot pain.  Patient states that she is unaware of any fall, trauma, injury.  She was moving recently and was doing a lot of lifting and moving.  She is unsure if this is contributing.  Pain is located at the base of the fifth metatarsal.  Associated swelling.  Pain is currently 5/10 in severity.  Worse with certain activities.  No relieving factors.  No other associated symptoms.  No other complaints.  PMH, Surgical Hx, Family Hx, Social History reviewed as below.  Past Medical History:  Diagnosis Date  . Anxiety   . Borderline diabetes   . BRONCHITIS, ACUTE WITH BRONCHOSPASM 06/25/2010   Qualifier: Diagnosis of  By: Alveta Heimlich MD, Cornelia Copa    . Chronic prescription benzodiazepine use 10/18/2015  . Depression   . GASTROENTERITIS WITHOUT DEHYDRATION 05/14/2009   Qualifier: Diagnosis of  By: Deborra Medina MD, Tanja Port    . GERD (gastroesophageal reflux disease)   . Herpes   . 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 Hyperlipidemia   . Irregular heart beat   . Sinus tachycardia 04/11/2017  . 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 Dossie Arbour Serial number: [id number] Model number: [id number]     Patient Active Problem List   Diagnosis Date Noted  . Chronic musculoskeletal pain 07/23/2017  . Trigger point with back pain 07/23/2017  . Lumbar spondylosis w/o Radiculopathy 07/16/2017  . Chronic sacroiliac joint pain (Left) 07/16/2017  . Sinus tachycardia 04/11/2017  . Pharmacologic therapy 02/13/2017  . Polypharmacy 02/13/2017  . Disorder of skeletal system 02/13/2017  . Problems influencing health status 02/13/2017  . Compression fracture  of L1 lumbar vertebra (old) 02/12/2017  . History of chest pain 02/06/2017  . Chronic pain syndrome 01/04/2017  . Atypical chest pain 09/20/2016  . Closed intertrochanteric fracture ([place]) 08/18/2016  . Orthostatic lightheadedness 08/10/2016  . Closed fracture of hip ([place]) 05/29/2016  . Chronic neck pain 02/02/2016  . Chronic prescription benzodiazepine use 10/18/2015  . Chronic low back pain (Primary Source of Pain) (Bilateral) (R>L) 10/18/2015  . 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 Lumbar spondylosis 10/18/2015  . Chronic lower extremity pain (Left) 10/18/2015  . Lumbar facet arthropathy 10/18/2015  . Lumbar facet syndrome (Bilateral) (R>L) 10/18/2015  . Chronic lower extremity pain (Secondary source of pain) (Right) 10/18/2015  . Chronic hip pain (Right) 10/18/2015  . Osteoarthritis of hip (Right) 10/18/2015  . Chronic constipation 12/03/2009  . Osteopenia 08/17/2009  . Mixed hyperlipidemia 06/29/2009  . Hypokalemia 12/11/2008  . Anxiety and depression 12/11/2008  . Common migraine 12/11/2008  . SVT (supraventricular tachycardia) ([place]) 12/11/2008  . Allergic rhinitis 12/11/2008  . Gastroesophageal reflux disease 12/11/2008  . Generalized osteoarthrosis of hand 12/11/2008  . Osteoporosis 12/11/2008  . Mixed urinary incontinence 12/11/2008  . History of nephrolithiasis 12/11/2008    Past Surgical History:  Procedure Laterality Date  . ABDOMINAL HYSTERECTOMY    . APPENDECTOMY    . BACK SURGERY     X 3  . BREAST EXCISIONAL BIOPSY Right    surgical bx age 68   . COLONOSCOPY N/A 08/28/2013   Procedure: COLONOSCOPY;  Surgeon:  Danie Binder, MD;  [facility];  Service: Endoscopy;  Laterality: N/A;  8:30 AM  . INTRAMEDULLARY (IM) NAIL INTERTROCHANTERIC Left 05/30/2016   Procedure: INTRAMEDULLARY (IM) NAIL INTERTROCHANTRIC;  Surgeon: Thornton Park, MD;  Location: [facility] OR[facility];  Service: Orthopedics;  Laterality: Left;  . SHOULDER SURGERY     . Spinal cord stimulator      OB History    Gravida  3   Para  2   Term  2   Preterm      AB  1   Living  2     SAB  1   TAB      Ectopic      Multiple      Live Births               Home Medications    Prior to Admission medications   Medication Sig Start Date End Date Taking? Authorizing Provider  aspirin EC 81 MG tablet Take 81 mg by mouth daily.    [provider]  atorvastatin (LIPITOR) 40 MG tablet Take 40 mg by mouth daily at 6 (six) AM.     [provider]  calcium carbonate (TUMS - DOSED IN MG ELEMENTAL CALCIUM) 500 MG chewable tablet Chew 1 tablet by mouth daily.    [provider]  Cholecalciferol (VITAMIN D3) 5000 units TABS Take 5,000 Units by mouth daily.    [provider]  hydrOXYzine (VISTARIL) 25 MG capsule Take 1 capsule (25 mg total) by mouth 3 (three) times daily as needed for anxiety. 01/10/18   Ursula Alert, MD  mirtazapine (REMERON) 15 MG tablet Take 0.5-1 tablets (7.5-15 mg total) by mouth at bedtime. FOR SLEEP 11/22/17   Ursula Alert, MD  Multiple Vitamin (MULTIVITAMIN) tablet Take 1 tablet by mouth daily.    [provider]  naproxen (NAPROSYN) 500 MG tablet Take 1 tablet (500 mg total) by mouth 2 (two) times daily as needed. 02/02/18   Coral Spikes, DO  pantoprazole (PROTONIX) 40 MG tablet Take 40 mg by mouth 2 (two) times daily.     [provider]  potassium chloride SA (K-DUR,KLOR-CON) 20 MEQ tablet Take 10 mEq by mouth 2 (two) times daily.    [provider]  traZODone (DESYREL) 50 MG tablet TAKE 1/2 TO 1 TABLET BY MOUTH AT BEDTIME 02/01/18   Ursula Alert, MD  Vilazodone HCl (VIIBRYD) 40 MG TABS Take 1 tablet (40 mg total) by mouth daily. 01/10/18   Ursula Alert, MD    Family History Family History  Problem Relation Age of Onset  . Diabetes Mother   . COPD Mother   . Heart disease Mother   . Diabetes Father   . COPD Father   . Heart disease Father   .  Heart attack Father 78       CABG  . Alcohol abuse Daughter   . Depression Daughter   . Drug abuse Daughter   . Anxiety disorder Daughter   . Tuberculosis Maternal Aunt   . Cancer Paternal Aunt        breast  . Breast cancer Paternal Aunt   . Diabetes Paternal Grandmother   . Diabetes Sister   . Cancer Cousin   . Diabetes Cousin   . Colon cancer Neg Hx   . Sudden Cardiac Death Neg Hx     Social History Social History   Tobacco Use  . Smoking status: Former Smoker    Packs/day: 0.25  Years: 14.00    Pack years: 3.50    Types: Cigarettes    Last attempt to quit: 1992    Years since quitting: 27.7  . Smokeless tobacco: Former Systems developer    Quit date: 08/29/1990  Substance Use Topics  . Alcohol use: Yes    Comment: occasional; once every 6 months  . Drug use: No     Allergies   Levofloxacin; Mobic [meloxicam]; Propoxyphene hcl; Tramadol; and Codeine   Review of Systems Review of Systems  Constitutional: Negative for fever.  Musculoskeletal:       Right foot pain & swelling.  All other systems reviewed and are negative.  Physical Exam Triage Vital Signs ED Triage Vitals  Enc Vitals Group     BP 02/02/18 0959 (!) 136/96     Pulse Rate 02/02/18 0959 (!) 110     Resp 02/02/18 0959 18     Temp 02/02/18 0959 98 F (36.7 C)     Temp Source 02/02/18 0959 Oral     SpO2 02/02/18 0959 95 %     Weight 02/02/18 1001 150 lb (68 kg)     Height 02/02/18 1001 5\' 1"  (1.549 m)     Head Circumference --      Peak Flow --      Pain Score 02/02/18 1001 5     Pain Loc --      Pain Edu? --      Excl. in [place]? --    Updated Vital Signs BP (!) 136/96 (BP Location: Right Arm)   Pulse (!) 110   Temp 98 F (36.7 C) (Oral)   Resp 18   Ht 5\' 1"  (1.549 m)   Wt 68 kg   LMP 09/30/1991 (Approximate) Comment: 1993  SpO2 95%   BMI 28.34 kg/m   Visual Acuity Right Eye Distance:   Left Eye Distance:   Bilateral Distance:    Right Eye Near:   Left Eye Near:    Bilateral Near:       Physical Exam  Constitutional: She is oriented to person, place, and time. She appears well-developed. No distress.  HENT:  Head: Normocephalic and atraumatic.  Pulmonary/Chest: Effort normal. No respiratory distress.  Musculoskeletal:  Right foot -base of the fifth metatarsal with exquisite tenderness to palpation.  Associated swelling.  Neurological: She is alert and oriented to person, place, and time.  Psychiatric: She has a normal mood and affect. Her behavior is normal.  Nursing note and vitals reviewed.  UC Treatments / Results  Labs (all labs ordered are listed, but only abnormal results are displayed) Labs Reviewed - No data to display  EKG None  Radiology Dg Foot Complete Right  Result Date: 02/02/2018 CLINICAL DATA:  62 year old female with a history of right foot pain EXAM: RIGHT FOOT COMPLETE - 3+ VIEW COMPARISON:  None. FINDINGS: Hallux valgus deformity. No acute fracture identified. No radiopaque foreign body. Questionable soft tissue swelling involving the lateral foot at the base of the fifth toe. No subluxation/dislocation. No erosive changes of the bone. Degenerative changes of the interphalangeal joints. Degenerative changes of the hindfoot IMPRESSION: Negative for acute bony abnormality. Questionable soft tissue swelling at the base of the fifth toe. Electronically Signed   By: Corrie Mckusick D.O.   On: 02/02/2018 10:30    Procedures Procedures (including critical care time)  Medications Ordered in UC Medications - No data to display  Initial Impression / Assessment and Plan / UC Course  I have reviewed the triage  vital signs and the nursing notes.  Pertinent labs & imaging results that were available during my care of the patient were reviewed by me and considered in my medical decision making (see chart for details).    62 year old female presents with foot pain.  X-ray negative.  Advised rest, ice, elevation.  Naproxen as directed.  Supportive  care.  Final Clinical Impressions(s) / UC Diagnoses   Final diagnoses:  Foot pain, right     Discharge Instructions     Xray negative.  Rest, Ice, elevation.  Medication as directed.  Take care  Dr. Lacinda Axon     ED Prescriptions    Medication Sig Dispense Auth. Provider   naproxen (NAPROSYN) 500 MG tablet Take 1 tablet (500 mg total) by mouth 2 (two) times daily as needed. 30 tablet Coral Spikes, DO     Controlled Substance Prescriptions [place] Controlled Substance Registry consulted? Not Applicable   Coral Spikes, DO 02/02/18 1044

## 2018-02-02 NOTE — ED Triage Notes (Signed)
Pt reports she was moving last weekend and thinks she did something to her right foot. Unsure of specific injury but pain 5/10 mostly to outer aspect of foot.

## 2018-02-02 NOTE — Discharge Instructions (Addendum)
Xray negative.  Rest, Ice, elevation.  Medication as directed.  Take care  Dr. Lacinda Axon

## 2018-02-07 ENCOUNTER — Other Ambulatory Visit: Payer: Self-pay

## 2018-02-07 ENCOUNTER — Encounter: Payer: Self-pay | Admitting: Psychiatry

## 2018-02-07 ENCOUNTER — Ambulatory Visit: Payer: Medicare HMO | Admitting: Psychiatry

## 2018-02-07 VITALS — BP 124/87 | HR 114 | Temp 99.0°F | Wt 153.2 lb

## 2018-02-07 DIAGNOSIS — F331 Major depressive disorder, recurrent, moderate: Secondary | ICD-10-CM | POA: Diagnosis not present

## 2018-02-07 DIAGNOSIS — F4010 Social phobia, unspecified: Secondary | ICD-10-CM

## 2018-02-07 DIAGNOSIS — F411 Generalized anxiety disorder: Secondary | ICD-10-CM | POA: Diagnosis not present

## 2018-02-07 MED ORDER — MIRTAZAPINE 15 MG PO TABS: 7.5000 mg | ORAL_TABLET | Freq: Every day | ORAL | 2 refills | Status: DC

## 2018-02-07 NOTE — Progress Notes (Signed)
[facility] OP Progress Note  02/07/2018 4:52 PM Wanda [name]  MRN:  [id number]  Chief Complaint: ' I am here for follow up.' Chief Complaint    Follow-up; Medication Refill     HPI: Amberle is a 62 year old Caucasian female, widowed, on SSD, lives in [place], has a history of depression, anxiety, SVT, orthostatic hypotension, hyper lipidemia, chronic pain, presented to the clinic today for a follow-up visit.   Today reports she moved to a new apartment in [place].  She reports she loves the new place and is happy with the move.  She reports she waited too long for the move to happen however she is finally settling into her new apartment.  She reports her mood as better ever since she moved into the new place.  She has made some new friends.  She reports she continues to be compliant with her medications.  She denies any side effects.  She reports sleep is fair.  She denies any suicidality.  Patient denies any homicidality.  Patient denies any perceptual disturbances.  Patient denies any other concerns today.   Visit Diagnosis:    ICD-10-CM   1. GAD (generalized anxiety disorder) F41.1 mirtazapine (REMERON) 15 MG tablet  2. MDD (major depressive disorder), recurrent episode, moderate (HCC) F33.1 mirtazapine (REMERON) 15 MG tablet  3. Social anxiety disorder F40.10     Past Psychiatric History: I have reviewed past psychiatric history from my progress note on 08/21/2017.  Past trials of Zoloft, Cymbalta, Effexor, Paxil, Prozac, bupropion, BuSpar, alprazolam, prazosin, Seroquel, trazodone, doxepin.     Past Medical History:  Past Medical History:  Diagnosis Date  . Anxiety   . Borderline diabetes   . BRONCHITIS, ACUTE WITH BRONCHOSPASM 06/25/2010   Qualifier: Diagnosis of  By: Alveta Heimlich MD, Cornelia Copa    . Chronic prescription benzodiazepine use 10/18/2015  . Depression   . GASTROENTERITIS WITHOUT DEHYDRATION 05/14/2009   Qualifier: Diagnosis of  By: Deborra Medina MD, Tanja Port    . GERD (gastroesophageal  reflux disease)   . Herpes   . 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 Hyperlipidemia   . Irregular heart beat   . Sinus tachycardia 04/11/2017  . 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 Dossie Arbour Serial number: [id number] Model number: [id number]     Past Surgical History:  Procedure Laterality Date  . ABDOMINAL HYSTERECTOMY    . APPENDECTOMY    . BACK SURGERY     X 3  . BREAST EXCISIONAL BIOPSY Right    surgical bx age 62   . COLONOSCOPY N/A 08/28/2013   Procedure: COLONOSCOPY;  Surgeon: Danie Binder, MD;  [facility];  Service: Endoscopy;  Laterality: N/A;  8:30 AM  . INTRAMEDULLARY (IM) NAIL INTERTROCHANTERIC Left 05/30/2016   Procedure: INTRAMEDULLARY (IM) NAIL INTERTROCHANTRIC;  Surgeon: Thornton Park, MD;  Location: [facility];  Service: Orthopedics;  Laterality: Left;  . SHOULDER SURGERY    . Spinal cord stimulator      Family Psychiatric History: Reviewed family psychiatric history from my progress note on 08/21/2017  Family History:  Family History  Problem Relation Age of Onset  . Diabetes Mother   . COPD Mother   . Heart disease Mother   . Diabetes Father   . COPD Father   . Heart disease Father   . Heart attack Father 83       CABG  . Alcohol abuse Daughter   .  Depression Daughter   . Drug abuse Daughter   . Anxiety disorder Daughter   . Tuberculosis Maternal Aunt   . Cancer Paternal Aunt        breast  . Breast cancer Paternal Aunt   . Diabetes Paternal Grandmother   . Diabetes Sister   . Cancer Cousin   . Diabetes Cousin   . Colon cancer Neg Hx   . Sudden Cardiac Death Neg Hx     Social History: Reviewed social history from my progress note on 08/21/2017 Social History   Socioeconomic History  . Marital status: Divorced    Spouse name: Not on file  . Number of children: 2  . Years of education: Not on file  . Highest education  level: High school graduate  Occupational History    Comment: disabled  Social Needs  . Financial resource strain: Somewhat hard  . Food insecurity:    Worry: Never true    Inability: Never true  . Transportation needs:    Medical: No    Non-medical: No  Tobacco Use  . Smoking status: Former Smoker    Packs/day: 0.25    Years: 14.00    Pack years: 3.50    Types: Cigarettes    Last attempt to quit: 1992    Years since quitting: 27.7  . Smokeless tobacco: Former Systems developer    Quit date: 08/29/1990  Substance and Sexual Activity  . Alcohol use: Yes    Comment: occasional; once every 6 months  . Drug use: No  . Sexual activity: Yes    Birth control/protection: Surgical  Lifestyle  . Physical activity:    Days per week: 0 days    Minutes per session: 0 min  . Stress: Very much  Relationships  . Social connections:    Talks on phone: Twice a week    Gets together: Never    Attends religious service: More than 4 times per year    Active member of club or organization: Yes    Attends meetings of clubs or organizations: Never    Relationship status: Divorced  Other Topics Concern  . Not on file  Social History Narrative  . Not on file    Allergies:  Allergies  Allergen Reactions  . Levofloxacin     REACTION: N \\T \ V  . Mobic [Meloxicam] Other (See Comments)    Calf tightness  . Propoxyphene Hcl     REACTION: N \\T \ V  . Tramadol Other (See Comments)    Severe headache  . Codeine Nausea Only    Slight nausea    Metabolic Disorder Labs: Lab Results  Component Value Date   HGBA1C 5.4 05/29/2016   MPG 108 05/29/2016   No results found for: PROLACTIN Lab Results  Component Value Date   CHOL 206 (H) 09/29/2016   TRIG 296 (H) 09/29/2016   HDL 41 09/29/2016   CHOLHDL 5.0 09/29/2016   VLDL 59 (H) 09/29/2016   LDLCALC 106 (H) 09/29/2016   LDLCALC 167 06/29/2009   LDLCALC 167 06/29/2009   Lab Results  Component Value Date   TSH 2.050 10/18/2016   TSH 1.330  08/09/2016    Therapeutic Level Labs: No results found for: LITHIUM No results found for: VALPROATE No components found for:  CBMZ  Current Medications: Current Outpatient Medications  Medication Sig Dispense Refill  . aspirin EC 81 MG tablet Take 81 mg by mouth daily.    Marland Kitchen atorvastatin (LIPITOR) 40 MG tablet Take 40 mg by mouth  daily at 6 (six) AM.     . calcium carbonate (TUMS - DOSED IN MG ELEMENTAL CALCIUM) 500 MG chewable tablet Chew 1 tablet by mouth daily.    . Cholecalciferol (VITAMIN D3) 5000 units TABS Take 5,000 Units by mouth daily.    . hydrOXYzine (VISTARIL) 25 MG capsule Take 1 capsule (25 mg total) by mouth 3 (three) times daily as needed for anxiety. 90 capsule 1  . mirtazapine (REMERON) 15 MG tablet Take 0.5-1 tablets (7.5-15 mg total) by mouth at bedtime. FOR SLEEP 30 tablet 2  . Multiple Vitamin (MULTIVITAMIN) tablet Take 1 tablet by mouth daily.    . naproxen (NAPROSYN) 500 MG tablet Take 1 tablet (500 mg total) by mouth 2 (two) times daily as needed. 30 tablet 0  . pantoprazole (PROTONIX) 40 MG tablet Take 40 mg by mouth 2 (two) times daily.     . potassium chloride SA (K-DUR,KLOR-CON) 20 MEQ tablet Take 10 mEq by mouth 2 (two) times daily.    . Vilazodone HCl (VIIBRYD) 40 MG TABS Take 1 tablet (40 mg total) by mouth daily. 30 tablet 1   No current facility-administered medications for this visit.      Musculoskeletal: Strength & Muscle Tone: within normal limits Gait & Station: normal Patient leans: N/A  Psychiatric Specialty Exam: Review of Systems  Psychiatric/Behavioral: The patient is nervous/anxious (improving).   All other systems reviewed and are negative.   Blood pressure 124/87, pulse (!) 114, temperature 99 F (37.2 C), temperature source Oral, weight 153 lb 3.2 oz (69.5 kg), last menstrual period 09/30/1991.Body mass index is 28.95 kg/m.  General Appearance: Casual  Eye Contact:  Fair  Speech:  Clear and Coherent  Volume:  Normal  Mood:   Anxious improving  Affect:  Congruent  Thought Process:  Goal Directed and Descriptions of Associations: Intact  Orientation:  Full (Time, Place, and Person)  Thought Content: Logical   Suicidal Thoughts:  No  Homicidal Thoughts:  No  Memory:  Immediate;   Fair Recent;   Fair Remote;   Fair  Judgement:  Fair  Insight:  Fair  Psychomotor Activity:  Normal  Concentration:  Concentration: Fair and Attention Span: Fair  Recall:  [employer] of Knowledge: Fair  Language: Fair  Akathisia:  No  Handed:  Right  AIMS (if indicated): na  Assets:  Communication Skills Desire for Improvement Social Support  ADL's:  Intact  Cognition: WNL  Sleep:  Fair   Screenings: PHQ2-9     Procedure visit from 07/24/2017 in [facility] Visit from 07/16/2017 in [facility] Procedure visit from 06/28/2017 in [facility] Visit from 03/21/2017 in [facility] Procedure visit from 02/20/2017 in [facility]  PHQ-2 Total Score  0  0  0  0  0       Assessment and Plan: Neisha is a 62 year old Caucasian female, widowed, on SSD, lives in [place], has a history of depression, anxiety, sleep issues, chronic pain, multiple medical issues including SVT, presented to the clinic today for a follow-up visit.  Patient recently moved into a new apartment and reports she is extremely being happy with the move.  She reports her mood symptoms is improving and being compliant on her current medications.  Plan as noted below.  Plan For depression Continue increased dosage of Viibryd at 40 mg p.o.  daily  For anxiety symptoms Viibryd 40 mg p.o. daily Hydroxyzine 25 mg p.o. 3 times daily as needed  For insomnia Remeron 7.5-15 mg p.o. Nightly  Patient to start psychotherapy at [facility].  Follow-up in clinic in 2 months or sooner if needed.  More than 50 % of the time was spent for psychoeducation and supportive psychotherapy and care coordination.  This note was generated in part or whole with voice recognition software. Voice recognition is usually quite accurate but there are transcription errors that can and very often do occur. I apologize for any typographical errors that were not detected and corrected.         Ursula Alert, MD 02/07/2018, 4:52 PM

## 2018-02-11 ENCOUNTER — Ambulatory Visit
Admission: RE | Admit: 2018-02-11 | Discharge: 2018-02-11 | Disposition: A | Discharge status: Home / Self Care | Payer: Medicare HMO | Source: Ambulatory Visit | Attending: Family Medicine | Admitting: Family Medicine

## 2018-02-11 DIAGNOSIS — Z1231 Encounter for screening mammogram for malignant neoplasm of breast: Secondary | ICD-10-CM | POA: Diagnosis present

## 2018-02-27 ENCOUNTER — Other Ambulatory Visit: Payer: Self-pay | Admitting: Student

## 2018-02-27 DIAGNOSIS — K219 Gastro-esophageal reflux disease without esophagitis: Secondary | ICD-10-CM

## 2018-03-11 ENCOUNTER — Other Ambulatory Visit: Payer: Self-pay | Admitting: Psychiatry

## 2018-03-11 DIAGNOSIS — F331 Major depressive disorder, recurrent, moderate: Secondary | ICD-10-CM

## 2018-03-11 DIAGNOSIS — F411 Generalized anxiety disorder: Secondary | ICD-10-CM

## 2018-03-14 ENCOUNTER — Ambulatory Visit
Admission: RE | Admit: 2018-03-14 | Discharge: 2018-03-14 | Disposition: A | Discharge status: Home / Self Care | Payer: Medicare HMO | Source: Ambulatory Visit | Attending: Student | Admitting: Student

## 2018-03-14 DIAGNOSIS — K219 Gastro-esophageal reflux disease without esophagitis: Secondary | ICD-10-CM | POA: Insufficient documentation

## 2018-03-14 DIAGNOSIS — K449 Diaphragmatic hernia without obstruction or gangrene: Secondary | ICD-10-CM | POA: Diagnosis not present

## 2018-03-31 IMAGING — CR DG WRIST COMPLETE 3+V*L*
1 series · 4 of 4 positions shown · non-contrast
Comparison: None.

CLINICAL DATA: Fall on outstretched left hand

EXAM:
LEFT WRIST - COMPLETE 3+ VIEW

[Series 1: x wrist pa left · 0.14mm/px · 4 of 4 slices shown]
[im 1/4]
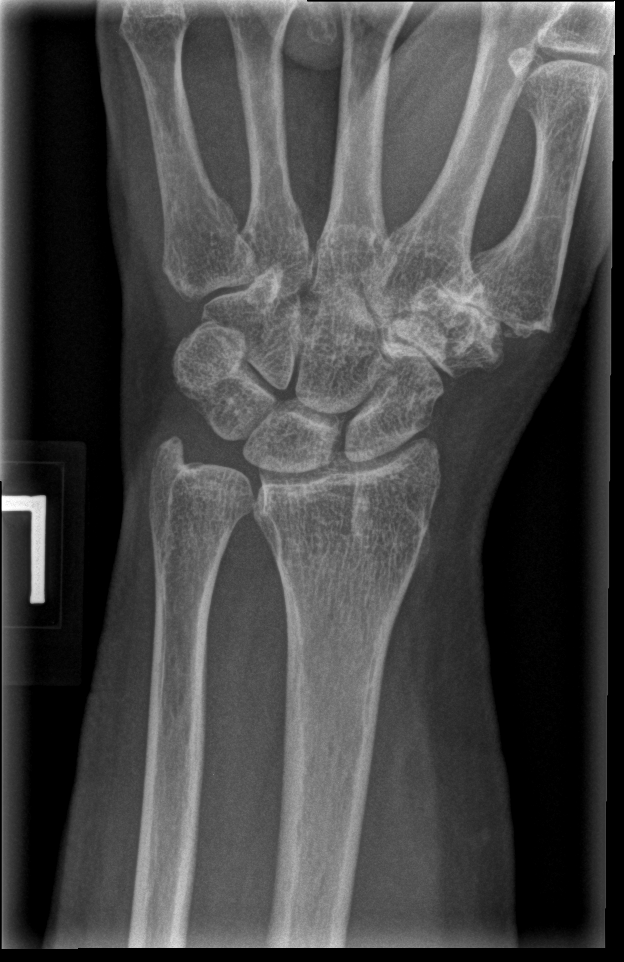
[im 2/4]
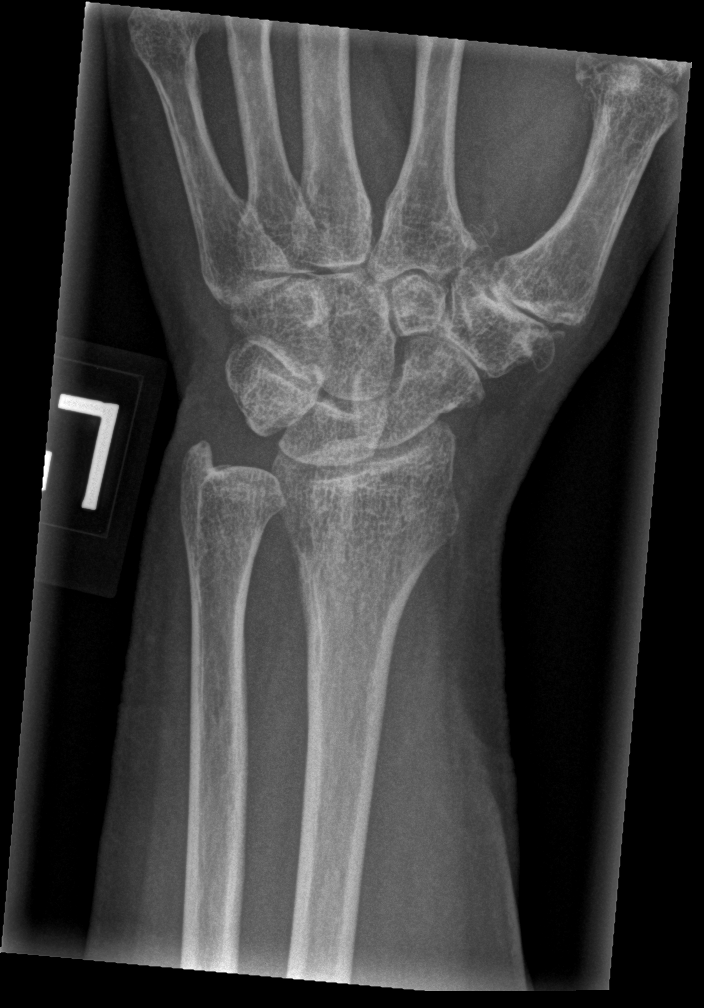
[im 3/4]
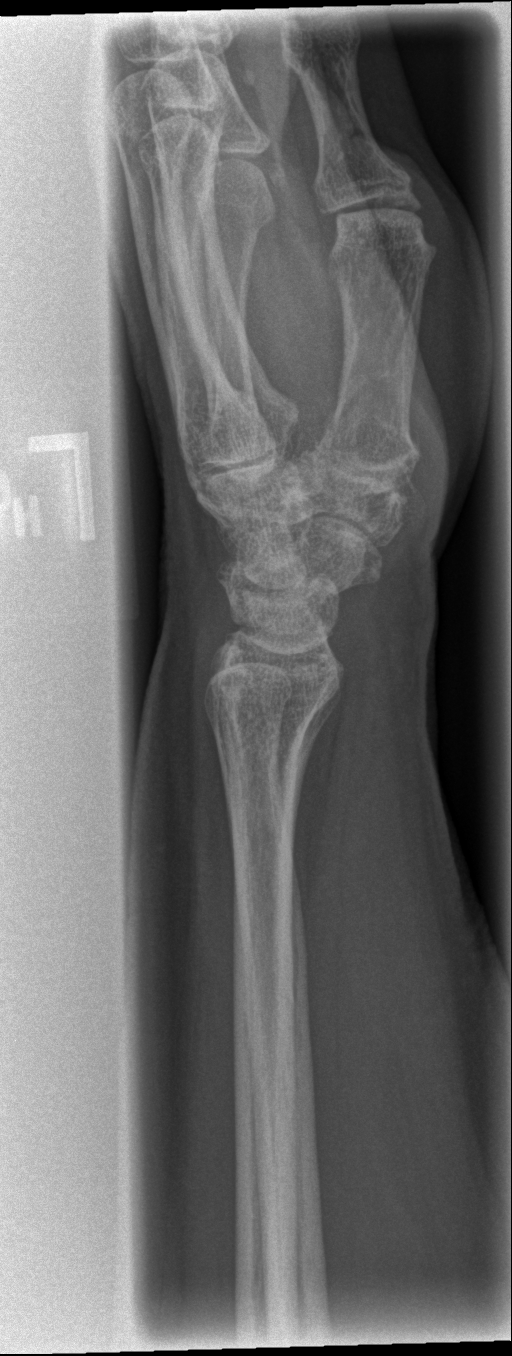
[im 4/4]
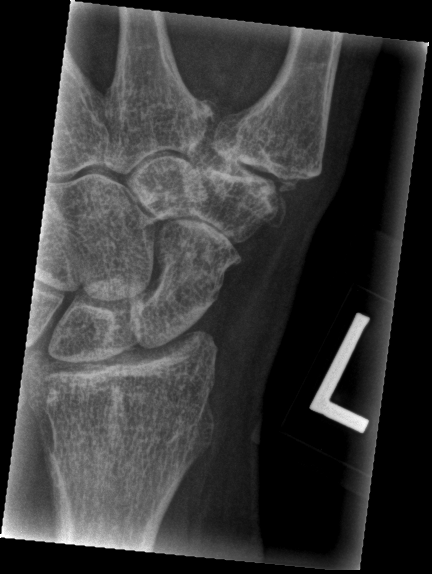

[4 of 4 positions shown; findings below may reference images not displayed]

FINDINGS: Mild cortical irregularity involving the radial styloid on the
oblique view. However, this appears intact on the frontal
radiograph. Correlate for point tenderness.

Degenerative changes at the 1st carpometacarpal joint.

Visualized soft tissues are within normal limits.
IMPRESSION: Mild cortical irregularity involving the radial styloid, although
only visualized on a single view. Correlate for point tenderness to
exclude subtle nondisplaced fracture. In lung in

## 2018-04-03 ENCOUNTER — Ambulatory Visit: Payer: Medicare HMO | Admitting: Internal Medicine

## 2018-04-03 ENCOUNTER — Encounter: Payer: Self-pay | Admitting: Internal Medicine

## 2018-04-03 VITALS — BP 148/84 | HR 120 | Ht 61.0 in | Wt 154.5 lb

## 2018-04-03 DIAGNOSIS — R42 Dizziness and giddiness: Secondary | ICD-10-CM | POA: Diagnosis not present

## 2018-04-03 DIAGNOSIS — R002 Palpitations: Secondary | ICD-10-CM

## 2018-04-03 DIAGNOSIS — R000 Tachycardia, unspecified: Secondary | ICD-10-CM | POA: Diagnosis not present

## 2018-04-03 MED ORDER — METOPROLOL SUCCINATE ER 25 MG PO TB24: 25.0000 mg | ORAL_TABLET | Freq: Every day | ORAL | 3 refills | Status: DC

## 2018-04-03 NOTE — Progress Notes (Signed)
Follow-up Outpatient Visit Date: 04/03/2018  Primary Care Provider: Gennette Pac, [place] [address]  Chief Complaint: Palpitations  HPI:  Wanda [name] is a 62 y.o. year-old female with history of orthostatic hypotension, hyperlipidemia, and irregular heart beat, who presents for follow-up of lightheadedness.  I last saw her in May, which time she noted improved lightheadedness without further falls or syncope.  She continues to drink lots of water and Gatorade.  We did not make any medication changes at that time.  Today, [name] reports feeling okay, other than continued palpitations during which it feels like her heart is racing.  She denies chest pain, shortness of breath, lightheadedness, and edema.  She has not had any syncope. She injured a tendon in her right ankle a few months ago and is wearing a brace now.  --------------------------------------------------------------------------------------------------  Cardiovascular History & Procedures: Cardiovascular Problems:  Orthostatic lightheadedness/fall   Risk Factors:  Hyperlipidemia  Cath/PCI:  None  CV Surgery:  None  EP Procedures and Devices:  Cardiac event monitor (06/28/16): Predominantly sinus rhythm with rare PACs and PVCs, as well as paroxysmal SVT (lasting up to 10.8 seconds).  Non-Invasive Evaluation(s):  Pharmacologic MPI (09/29/16): Low risk study without ischemia or scar. Nonspecific ST depression noted with regular dentist sent. LVEF 55-65%.  Bilateral carotid Doppler (05/31/16): Mild bilateral carotid atherosclerosis (<50%). Antegrade vertebral artery flow bilaterally.  TTE (05/29/16): Technically difficult study. Normal LV size and function (LVEF 60-65%). No significant valvular abnormalities (though suboptimally visualized). Normal RV size and function.  Recent CV Pertinent Labs: Lab Results  Component Value Date   CHOL 206 (H) 09/29/2016   HDL 41 09/29/2016     LDLCALC 106 (H) 09/29/2016   LDLDIRECT 197.3 02/01/2010   TRIG 296 (H) 09/29/2016   CHOLHDL 5.0 09/29/2016   K 3.6 10/04/2017   K 3.8 12/04/2012   MG 2.2 02/06/2017   BUN 8 10/04/2017   BUN 9 04/26/2017   BUN 16 12/04/2012   CREATININE 0.59 10/04/2017   CREATININE 0.64 12/04/2012    Past medical and surgical history were reviewed and updated in EPIC.  Current Meds  Medication Sig  . aspirin EC 81 MG tablet Take 81 mg by mouth daily.  Marland Kitchen atorvastatin (LIPITOR) 40 MG tablet Take 40 mg by mouth daily at 6 (six) AM.   . Cholecalciferol (VITAMIN D3) 5000 units TABS Take 5,000 Units by mouth daily.  Marland Kitchen dexlansoprazole (DEXILANT) 60 MG capsule Take 60 mg by mouth daily.   . hydrOXYzine (VISTARIL) 25 MG capsule Take 1 capsule (25 mg total) by mouth 3 (three) times daily as needed for anxiety.  Marland Kitchen linaclotide (LINZESS) 72 MCG capsule Take by mouth.  . mirtazapine (REMERON) 15 MG tablet Take 0.5-1 tablets (7.5-15 mg total) by mouth at bedtime. FOR SLEEP  . Multiple Vitamin (MULTIVITAMIN) tablet Take 1 tablet by mouth daily.  . naproxen (NAPROSYN) 500 MG tablet Take 1 tablet (500 mg total) by mouth 2 (two) times daily as needed.  . potassium chloride SA (K-DUR,KLOR-CON) 20 MEQ tablet Take 10 mEq by mouth 2 (two) times daily.  . ranitidine (ZANTAC) 300 MG tablet Take 300 mg by mouth at bedtime.   Marland Kitchen VIIBRYD 40 MG TABS TAKE 1 TABLET(40 MG) BY MOUTH DAILY    Allergies: Levofloxacin; Mobic [meloxicam]; Propoxyphene hcl; Tramadol; and Codeine  Social History   Tobacco Use  . Smoking status: Former Smoker    Packs/day: 0.25    Years: 14.00    Pack years:  3.50    Types: Cigarettes    Last attempt to quit: 1992    Years since quitting: 27.8  . Smokeless tobacco: Former Systems developer    Quit date: 08/29/1990  Substance Use Topics  . Alcohol use: Yes    Comment: occasional; once every 6 months  . Drug use: No    Family History  Problem Relation Age of Onset  . Diabetes Mother   . COPD Mother    . Heart disease Mother   . Diabetes Father   . COPD Father   . Heart disease Father   . Heart attack Father 49       CABG  . Alcohol abuse Daughter   . Depression Daughter   . Drug abuse Daughter   . Anxiety disorder Daughter   . Tuberculosis Maternal Aunt   . Cancer Paternal Aunt        breast  . Breast cancer Paternal Aunt   . Diabetes Paternal Grandmother   . Diabetes Sister   . Cancer Cousin   . Diabetes Cousin   . Colon cancer Neg Hx   . Sudden Cardiac Death Neg Hx     Review of Systems: A 12-system review of systems was performed and was negative except as noted in the HPI.  --------------------------------------------------------------------------------------------------  Physical Exam: BP (!) 148/84 (BP Location: Left Arm, Patient Position: Sitting, Cuff Size: Normal)   Pulse (!) 120   Ht 5\' 1"  (1.549 m)   Wt 154 lb 8 oz (70.1 kg)   LMP 09/30/1991 (Approximate) Comment: 1993  BMI 29.19 kg/m   General:  NAD. HEENT: No conjunctival pallor or scleral icterus. Moist mucous membranes.  OP clear. Neck: Supple without lymphadenopathy, thyromegaly, JVD, or HJR. Lungs: Normal work of breathing. Clear to auscultation bilaterally without wheezes or crackles. Heart: Tachycardic but regular without murmurs, rubs, or gallops. Non-displaced PMI. Abd: Bowel sounds present. Soft, NT/ND without hepatosplenomegaly Ext: No lower extremity edema. Radial, PT, and DP pulses are 2+ bilaterally. Skin: Warm and dry without rash.  EKG:  Sinus tachycardia with nonspecific ST segment changes.  Lab Results  Component Value Date   WBC 5.6 02/06/2017   HGB 12.9 02/06/2017   HCT 38.4 02/06/2017   MCV 97 02/06/2017   PLT 232 02/06/2017    Lab Results  Component Value Date   NA 138 10/04/2017   K 3.6 10/04/2017   CL 102 10/04/2017   CO2 27 10/04/2017   BUN 8 10/04/2017   CREATININE 0.59 10/04/2017   GLUCOSE 98 10/04/2017   ALT 29 08/09/2016    Lab Results  Component Value  Date   CHOL 206 (H) 09/29/2016   HDL 41 09/29/2016   LDLCALC 106 (H) 09/29/2016   LDLDIRECT 197.3 02/01/2010   TRIG 296 (H) 09/29/2016   CHOLHDL 5.0 09/29/2016    --------------------------------------------------------------------------------------------------  ASSESSMENT AND PLAN: Sinus tachycardia and palpitations This has been an intermittent problem for years.  Ms. Orlov is trying to stay well-hydrated.  She appears euvolemic on exam today and is otherwise asymptomatic.  She reports having labs done within the last few weeks at the [facility].  We will reach out to them to request a copy, particularly for BMP, CBC, and TSH.  Given mildly elevated blood pressure today, we will start metoprolol succinate 25 mg daily.  Orthostatic lightheadedness Improved with aggressive hydration.  I encouraged continued hydration and liberalization of sodium intake.  Follow-up: Return to clinic in 3 months.  Nelva Bush, MD 04/03/2018 11:23  AM

## 2018-04-03 NOTE — Patient Instructions (Signed)
Medication Instructions:  Your physician has recommended you make the following change in your medication:  1- START Metoprolol succinate 25 mg by mouth once a day.  If you need a refill on your cardiac medications before your next appointment, please call your pharmacy.   Lab work: Labwork will be requested from your primary care physician.   If you have labs (blood work) drawn today and your tests are completely normal, you will receive your results only by: Marland Kitchen MyChart Message (if you have MyChart) OR . A paper copy in the mail If you have any lab test that is abnormal or we need to change your treatment, we will call you to review the results.  Testing/Procedures: none  Follow-Up: At [facility], you and your health needs are our priority.  As part of our continuing mission to provide you with exceptional heart care, we have created designated Provider Care Teams.  These Care Teams include your primary Cardiologist (physician) and Advanced Practice Providers (APPs -  Physician Assistants and Nurse Practitioners) who all work together to provide you with the care you need, when you need it. You will need a follow up appointment in 3 months.  Please call our office 2 months in advance to schedule this appointment.  You may see DR Harrell Gave END or one of the following Advanced Practice Providers on your designated Care Team:   Murray Hodgkins, NP Christell Faith, PA-C . Marrianne Mood, PA-C

## 2018-04-07 ENCOUNTER — Other Ambulatory Visit: Payer: Self-pay | Admitting: Psychiatry

## 2018-04-07 ENCOUNTER — Other Ambulatory Visit: Payer: Self-pay | Admitting: Internal Medicine

## 2018-04-07 DIAGNOSIS — F411 Generalized anxiety disorder: Secondary | ICD-10-CM

## 2018-04-07 DIAGNOSIS — F331 Major depressive disorder, recurrent, moderate: Secondary | ICD-10-CM

## 2018-04-09 ENCOUNTER — Encounter: Payer: Self-pay | Admitting: Psychiatry

## 2018-04-09 ENCOUNTER — Other Ambulatory Visit: Payer: Self-pay

## 2018-04-09 ENCOUNTER — Ambulatory Visit: Payer: Medicare HMO | Admitting: Psychiatry

## 2018-04-09 VITALS — BP 135/88 | HR 106 | Temp 98.1°F | Wt 153.4 lb

## 2018-04-09 DIAGNOSIS — F331 Major depressive disorder, recurrent, moderate: Secondary | ICD-10-CM

## 2018-04-09 DIAGNOSIS — F411 Generalized anxiety disorder: Secondary | ICD-10-CM | POA: Diagnosis not present

## 2018-04-09 DIAGNOSIS — F4010 Social phobia, unspecified: Secondary | ICD-10-CM | POA: Diagnosis not present

## 2018-04-09 MED ORDER — HYDROXYZINE PAMOATE 25 MG PO CAPS: 25.0000 mg | ORAL_CAPSULE | Freq: Three times a day (TID) | ORAL | 1 refills | Status: DC | PRN

## 2018-04-09 MED ORDER — MIRTAZAPINE 15 MG PO TABS: 7.5000 mg | ORAL_TABLET | Freq: Every day | ORAL | 2 refills | Status: DC

## 2018-04-09 NOTE — Progress Notes (Signed)
Wanda [name]  04/09/2018 4:29 PM Wanda [name]  MRN:  [id number]  Chief Complaint: ' I am here for follow up." Chief Complaint    Follow-up; Medication Refill     HPI: Wanda [name] is a 62 year old Caucasian female, widowed, on SSD, lives in [place], has a history of depression, anxiety, SVT, orthostatic hypotension, hyperlipidemia, chronic pain, presented to the clinic today for a follow-up visit.  Patient reports her mood symptoms as stable on the current medication regimen.  She is tolerating the medications well.  She reports she is compliant on her Viibryd as well as Remeron.  She continues to make use of hydroxyzine as needed which is helpful.  Patient reports she continues to enjoy her new apartment.  She has made some new friends.  She however reports she continues to have psychosocial stressors of her daughter being in a domestic violence situation.  She also has a grandson who has legal issues and is a drug abuser.  Patient reports she continues to try to be supportive however there is not a lot she can do to help them since they also have to be cooperative and have to make that decision for themselves.  Patient reports she hence has been trying to stay positive and do what she can at this time.  She denies any suicidality.  She denies any perceptual disturbances.   Visit Diagnosis:    ICD-10-CM   1. GAD (generalized anxiety disorder) F41.1 mirtazapine (REMERON) 15 MG tablet    hydrOXYzine (VISTARIL) 25 MG capsule  2. MDD (major depressive disorder), recurrent episode, moderate (HCC) F33.1 mirtazapine (REMERON) 15 MG tablet  3. Social anxiety disorder F40.10     Past Psychiatric History: Have reviewed past psychiatric history from my progress [name] on 08/21/2017.  Past trials of Zoloft, Cymbalta, Effexor, Paxil, Prozac, bupropion, BuSpar, alprazolam, prazosin, Seroquel, trazodone, doxepin.  Past Medical History:  Past Medical History:  Diagnosis Date  . Anxiety   .  Borderline diabetes   . BRONCHITIS, ACUTE WITH BRONCHOSPASM 06/25/2010   Qualifier: Diagnosis of  By: Alveta Heimlich MD, Cornelia Copa    . Chronic prescription benzodiazepine use 10/18/2015  . Depression   . GASTROENTERITIS WITHOUT DEHYDRATION 05/14/2009   Qualifier: Diagnosis of  By: Deborra Medina MD, Tanja Port    . GERD (gastroesophageal reflux disease)   . Herpes   . 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 Hyperlipidemia   . Irregular heart beat   . Sinus tachycardia 04/11/2017  . 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 Dossie Arbour Serial number: [id number] Model number: [id number]     Past Surgical History:  Procedure Laterality Date  . ABDOMINAL HYSTERECTOMY    . APPENDECTOMY    . BACK SURGERY     X 3  . BREAST EXCISIONAL BIOPSY Right    surgical bx age 67   . COLONOSCOPY N/A 08/28/2013   Procedure: COLONOSCOPY;  Surgeon: Danie Binder, MD;  Location[facility];  Service: Endoscopy;  Laterality: N/A;  8:30 AM  . INTRAMEDULLARY (IM) NAIL INTERTROCHANTERIC Left 05/30/2016   Procedure: INTRAMEDULLARY (IM) NAIL INTERTROCHANTRIC;  Surgeon: Thornton Park, MD;  Location: [facility] ORS;  Service: Orthopedics;  Laterality: Left;  . SHOULDER SURGERY    . Spinal cord stimulator      Family Psychiatric History: Reviewed family psychiatric history from my progress [name] on 08/21/2017  Family History:  Family History  Problem Relation Age of Onset  .  Diabetes Mother   . COPD Mother   . Heart disease Mother   . Diabetes Father   . COPD Father   . Heart disease Father   . Heart attack Father 55       CABG  . Alcohol abuse Daughter   . Depression Daughter   . Drug abuse Daughter   . Anxiety disorder Daughter   . Tuberculosis Maternal Aunt   . Cancer Paternal Aunt        breast  . Breast cancer Paternal Aunt   . Diabetes Paternal Grandmother   . Diabetes Sister   . Cancer Cousin   . Diabetes Cousin   .  Colon cancer Neg Hx   . Sudden Cardiac Death Neg Hx     Social History: Reviewed social history from my progress [name] on 08/21/2017 Social History   Socioeconomic History  . Marital status: Divorced    Spouse name: Not on file  . Number of children: 2  . Years of education: Not on file  . Highest education level: High school graduate  Occupational History    Comment: disabled  Social Needs  . Financial resource strain: Somewhat hard  . Food insecurity:    Worry: Never true    Inability: Never true  . Transportation needs:    Medical: No    Non-medical: No  Tobacco Use  . Smoking status: Former Smoker    Packs/day: 0.25    Years: 14.00    Pack years: 3.50    Types: Cigarettes    Last attempt to quit: 1992    Years since quitting: 27.8  . Smokeless tobacco: Former Systems developer    Quit date: 08/29/1990  Substance and Sexual Activity  . Alcohol use: Yes    Comment: occasional; once every 6 months  . Drug use: No  . Sexual activity: Yes    Birth control/protection: Surgical  Lifestyle  . Physical activity:    Days per week: 0 days    Minutes per session: 0 min  . Stress: Very much  Relationships  . Social connections:    Talks on phone: Twice a week    Gets together: Never    Attends religious service: More than 4 times per year    Active member of club or organization: Yes    Attends meetings of clubs or organizations: Never    Relationship status: Divorced  Other Topics Concern  . Not on file  Social History Narrative  . Not on file    Allergies:  Allergies  Allergen Reactions  . Levofloxacin     REACTION: N \\T \ V  . Mobic [Meloxicam] Other (See Comments)    Calf tightness  . Propoxyphene Hcl     REACTION: N \\T \ V  . Tramadol Other (See Comments)    Severe headache  . Codeine Nausea Only    Slight nausea    Metabolic Disorder Labs: Lab Results  Component Value Date   HGBA1C 5.4 05/29/2016   MPG 108 05/29/2016   No results found for: PROLACTIN Lab  Results  Component Value Date   CHOL 206 (H) 09/29/2016   TRIG 296 (H) 09/29/2016   HDL 41 09/29/2016   CHOLHDL 5.0 09/29/2016   VLDL 59 (H) 09/29/2016   LDLCALC 106 (H) 09/29/2016   LDLCALC 167 06/29/2009   LDLCALC 167 06/29/2009   Lab Results  Component Value Date   TSH 2.050 10/18/2016   TSH 1.330 08/09/2016    Therapeutic Level Labs: No results found  for: LITHIUM No results found for: VALPROATE No components found for:  CBMZ  Current Medications: Current Outpatient Medications  Medication Sig Dispense Refill  . aspirin EC 81 MG tablet Take 81 mg by mouth daily.    Marland Kitchen atorvastatin (LIPITOR) 40 MG tablet Take 40 mg by mouth daily at 6 (six) AM.     . calcium carbonate (TUMS - DOSED IN MG ELEMENTAL CALCIUM) 500 MG chewable tablet Chew 1 tablet by mouth daily.    . Cholecalciferol (VITAMIN D3) 5000 units TABS Take 5,000 Units by mouth daily.    Marland Kitchen dexlansoprazole (DEXILANT) 60 MG capsule Take 60 mg by mouth daily.     . hydrOXYzine (VISTARIL) 25 MG capsule Take 1 capsule (25 mg total) by mouth 3 (three) times daily as needed for anxiety. 90 capsule 1  . linaclotide (LINZESS) 72 MCG capsule Take by mouth.    . metoprolol succinate (TOPROL XL) 25 MG 24 hr tablet Take 1 tablet (25 mg total) by mouth daily. 90 tablet 3  . mirtazapine (REMERON) 15 MG tablet Take 0.5-1 tablets (7.5-15 mg total) by mouth at bedtime. FOR SLEEP 30 tablet 2  . Multiple Vitamin (MULTIVITAMIN) tablet Take 1 tablet by mouth daily.    . naproxen (NAPROSYN) 500 MG tablet Take 1 tablet (500 mg total) by mouth 2 (two) times daily as needed. 30 tablet 0  . potassium chloride SA (K-DUR,KLOR-CON) 20 MEQ tablet TAKE 2 TABLETS BY MOUTH DAILY 60 tablet 2  . ranitidine (ZANTAC) 300 MG tablet Take 300 mg by mouth at bedtime.     Marland Kitchen VIIBRYD 40 MG TABS TAKE 1 TABLET(40 MG) BY MOUTH DAILY 30 tablet 0   No current facility-administered medications for this visit.      Musculoskeletal: Strength & Muscle Tone: within  normal limits Gait & Station: normal Patient leans: N/A  Psychiatric Specialty Exam: Review of Systems  Psychiatric/Behavioral: The patient is nervous/anxious.   All other systems reviewed and are negative.   Blood pressure 135/88, pulse (!) 106, temperature 98.1 F (36.7 C), temperature source Oral, weight 153 lb 6.4 oz (69.6 kg), last menstrual period 09/30/1991.Body mass index is 28.98 kg/m.  General Appearance: Casual  Eye Contact:  Fair  Speech:  Clear and Coherent  Volume:  Normal  Mood:  Anxious  Affect:  Congruent  Thought Process:  Goal Directed and Descriptions of Associations: Intact  Orientation:  Full (Time, Place, and Person)  Thought Content: Logical   Suicidal Thoughts:  No  Homicidal Thoughts:  No  Memory:  Immediate;   Fair Recent;   Fair Remote;   Fair  Judgement:  Fair  Insight:  Fair  Psychomotor Activity:  Normal  Concentration:  Concentration: Fair and Attention Span: Fair  Recall:  [employer] of Knowledge: Fair  Language: Fair  Akathisia:  No  Handed:  Right  AIMS (if indicated): denies tremors, rigidity,stiffness  Assets:  Communication Skills Desire for Improvement Social Support  ADL's:  Intact  Cognition: WNL  Sleep:  Fair   Screenings: PHQ2-9     Procedure visit from 07/24/2017 in [place] Office Visit from 07/16/2017 in [place] Procedure visit from 06/28/2017 in [place] Office Visit from 03/21/2017 in [place] Procedure visit from 02/20/2017 in [place]  PHQ-2 Total Score  0  0  0  0  0  Assessment and Plan: Marney is a 62 year old Caucasian female, widowed, on SSD, lives in [place], has a history of depression, anxiety, sleep issues, chronic pain, multiple medical issues including SVT, presented to  the clinic today for a follow-up visit.  Patient continues to be compliant with her medication and reports her mood symptoms are stable.  Plan as noted below.  Plan For depression-improving Viibryd 40 mg p.o. daily.  For anxiety symptoms Viibryd 40 mg p.o. daily Hydroxyzine 25 mg p.o. 3 times daily as needed  For insomnia Remeron 7.5-15 mg p.o. Nightly  Patient to start psychotherapy at [facility]  Follow-up in clinic in 2-3 months or sooner if needed.  More than 50 % of the time was spent for psychoeducation and supportive psychotherapy and care coordination.  This [name] was generated in part or whole with voice recognition software. Voice recognition is usually quite accurate but there are transcription errors that can and very often do occur. I apologize for any typographical errors that were not detected and corrected.         Ursula Alert, MD 04/09/2018, 4:29 PM

## 2018-05-27 ENCOUNTER — Other Ambulatory Visit: Payer: Self-pay | Admitting: Psychiatry

## 2018-05-27 DIAGNOSIS — F411 Generalized anxiety disorder: Secondary | ICD-10-CM

## 2018-05-27 DIAGNOSIS — F331 Major depressive disorder, recurrent, moderate: Secondary | ICD-10-CM

## 2018-06-11 ENCOUNTER — Encounter: Payer: Self-pay | Admitting: Psychiatry

## 2018-06-11 ENCOUNTER — Other Ambulatory Visit: Payer: Self-pay

## 2018-06-11 ENCOUNTER — Ambulatory Visit: Payer: Medicare HMO | Admitting: Psychiatry

## 2018-06-11 VITALS — BP 122/85 | HR 95 | Temp 99.2°F | Wt 155.8 lb

## 2018-06-11 DIAGNOSIS — F411 Generalized anxiety disorder: Secondary | ICD-10-CM | POA: Diagnosis not present

## 2018-06-11 DIAGNOSIS — F4010 Social phobia, unspecified: Secondary | ICD-10-CM | POA: Diagnosis not present

## 2018-06-11 DIAGNOSIS — F331 Major depressive disorder, recurrent, moderate: Secondary | ICD-10-CM | POA: Diagnosis not present

## 2018-06-11 DIAGNOSIS — R000 Tachycardia, unspecified: Secondary | ICD-10-CM

## 2018-06-11 MED ORDER — MIRTAZAPINE 15 MG PO TABS: 7.5000 mg | ORAL_TABLET | Freq: Every day | ORAL | 3 refills | Status: DC

## 2018-06-11 MED ORDER — VILAZODONE HCL 40 MG PO TABS
ORAL_TABLET | ORAL | 3 refills | Status: DC
Start: 2018-06-11 — End: 2018-09-10

## 2018-06-11 NOTE — Progress Notes (Signed)
[facility] Progress Note  06/11/2018 5:39 PM Wanda [name]  MRN:  [id number]  Chief Complaint: ' I am here for follow up.' Chief Complaint    Follow-up; Medication Refill     HPI: Wanda [name] is a 63 year old Caucasian female, widowed, on SSD, lives in [place], has a history of depression, anxiety, SVT, orthostatic hypertension, hyperlipidemia, chronic pain, presented to the clinic today for a follow-up visit.  Patient today reports she is making progress with regards to her mood symptoms.  She is tolerating her medications well.  She continues to stay compliant on the Viibryd and mirtazapine.  Patient reports she enjoys her new apartment.  She continues to make new friends.  She reports she is looking forward to starting walking again with this new friend.  Patient wonders whether she can be provided with a letter so that she can keep a pet dog in her apartment.  Patient denies any suicidality, homicidality or perceptual disturbances.  Patient's heart rate was noted as elevated today and hence it was rechecked during the session.  Her heart rate came down to within normal limits.  Patient continues to follow-up with her cardiologist. Visit Diagnosis:    ICD-10-CM   1. GAD (generalized anxiety disorder) F41.1 mirtazapine (REMERON) 15 MG tablet    Vilazodone HCl (VIIBRYD) 40 MG TABS  2. MDD (major depressive disorder), recurrent episode, moderate (HCC) F33.1 mirtazapine (REMERON) 15 MG tablet    Vilazodone HCl (VIIBRYD) 40 MG TABS  3. Social anxiety disorder F40.10   4. Tachycardia R00.0     Past Psychiatric History: I have reviewed past psychiatric history from my progress note on 08/21/2017.  Past trials of Zoloft, Cymbalta, Effexor, Paxil, Prozac, bupropion, BuSpar, Xanax, prazosin, Seroquel, trazodone, doxepin  Past Medical History:  Past Medical History:  Diagnosis Date  . Anxiety   . Borderline diabetes   . BRONCHITIS, ACUTE WITH BRONCHOSPASM 06/25/2010   Qualifier: Diagnosis of   By: Alveta Heimlich MD, Cornelia Copa    . Chronic prescription benzodiazepine use 10/18/2015  . Depression   . GASTROENTERITIS WITHOUT DEHYDRATION 05/14/2009   Qualifier: Diagnosis of  By: Deborra Medina MD, Tanja Port    . GERD (gastroesophageal reflux disease)   . Herpes   . 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 Hyperlipidemia   . Irregular heart beat   . Sinus tachycardia 04/11/2017  . 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 Dossie Arbour Serial number: [id number] Model number: [id number]     Past Surgical History:  Procedure Laterality Date  . ABDOMINAL HYSTERECTOMY    . APPENDECTOMY    . BACK SURGERY     X 3  . BREAST EXCISIONAL BIOPSY Right    surgical bx age 49   . COLONOSCOPY N/A 08/28/2013   Procedure: COLONOSCOPY;  Surgeon: Danie Binder, MD;  Location: [facility];  Service: Endoscopy;  Laterality: N/A;  8:30 AM  . INTRAMEDULLARY (IM) NAIL INTERTROCHANTERIC Left 05/30/2016   Procedure: INTRAMEDULLARY (IM) NAIL INTERTROCHANTRIC;  Surgeon: Thornton Park, MD;  Location: [facility] ORS;  Service: Orthopedics;  Laterality: Left;  . SHOULDER SURGERY    . Spinal cord stimulator      Family Psychiatric History: Reviewed family psychiatric history from my progress note on 08/21/2017  Family History:  Family History  Problem Relation Age of Onset  . Diabetes Mother   . COPD Mother   . Heart disease Mother   . Diabetes Father   .  COPD Father   . Heart disease Father   . Heart attack Father 12       CABG  . Alcohol abuse Daughter   . Depression Daughter   . Drug abuse Daughter   . Anxiety disorder Daughter   . Tuberculosis Maternal Aunt   . Cancer Paternal Aunt        breast  . Breast cancer Paternal Aunt   . Diabetes Paternal Grandmother   . Diabetes Sister   . Cancer Cousin   . Diabetes Cousin   . Colon cancer Neg Hx   . Sudden Cardiac Death Neg Hx     Social History: I have reviewed social  history from my progress note on 08/21/2017. Social History   Socioeconomic History  . Marital status: Divorced    Spouse name: Not on file  . Number of children: 2  . Years of education: Not on file  . Highest education level: High school graduate  Occupational History    Comment: disabled  Social Needs  . Financial resource strain: Somewhat hard  . Food insecurity:    Worry: Never true    Inability: Never true  . Transportation needs:    Medical: No    Non-medical: No  Tobacco Use  . Smoking status: Former Smoker    Packs/day: 0.25    Years: 14.00    Pack years: 3.50    Types: Cigarettes    Last attempt to quit: 1992    Years since quitting: 28.0  . Smokeless tobacco: Former Systems developer    Quit date: 08/29/1990  Substance and Sexual Activity  . Alcohol use: Yes    Comment: occasional; once every 6 months  . Drug use: No  . Sexual activity: Yes    Birth control/protection: Surgical  Lifestyle  . Physical activity:    Days per week: 0 days    Minutes per session: 0 min  . Stress: Very much  Relationships  . Social connections:    Talks on phone: Twice a week    Gets together: Never    Attends religious service: More than 4 times per year    Active member of club or organization: Yes    Attends meetings of clubs or organizations: Never    Relationship status: Divorced  Other Topics Concern  . Not on file  Social History Narrative  . Not on file    Allergies:  Allergies  Allergen Reactions  . Levofloxacin     REACTION: N \\T \ V  . Mobic [Meloxicam] Other (See Comments)    Calf tightness  . Propoxyphene Hcl     REACTION: N \\T \ V  . Tramadol Other (See Comments)    Severe headache  . Codeine Nausea Only    Slight nausea    Metabolic Disorder Labs: Lab Results  Component Value Date   HGBA1C 5.4 05/29/2016   MPG 108 05/29/2016   No results found for: PROLACTIN Lab Results  Component Value Date   CHOL 206 (H) 09/29/2016   TRIG 296 (H) 09/29/2016   HDL 41  09/29/2016   CHOLHDL 5.0 09/29/2016   VLDL 59 (H) 09/29/2016   LDLCALC 106 (H) 09/29/2016   LDLCALC 167 06/29/2009   LDLCALC 167 06/29/2009   Lab Results  Component Value Date   TSH 2.050 10/18/2016   TSH 1.330 08/09/2016    Therapeutic Level Labs: No results found for: LITHIUM No results found for: VALPROATE No components found for:  CBMZ  Current Medications: Current Outpatient Medications  Medication Sig Dispense Refill  . aspirin EC 81 MG tablet Take 81 mg by mouth daily.    Marland Kitchen atorvastatin (LIPITOR) 40 MG tablet Take 40 mg by mouth daily at 6 (six) AM.     . calcium carbonate (TUMS - DOSED IN MG ELEMENTAL CALCIUM) 500 MG chewable tablet Chew 1 tablet by mouth daily.    . Cholecalciferol (VITAMIN D3) 5000 units TABS Take 5,000 Units by mouth daily.    Marland Kitchen dexlansoprazole (DEXILANT) 60 MG capsule Take 60 mg by mouth daily.     . hydrOXYzine (VISTARIL) 25 MG capsule TAKE 1 CAPSULE(25 MG) BY MOUTH THREE TIMES DAILY AS NEEDED FOR ANXIETY 90 capsule 1  . metoprolol succinate (TOPROL XL) 25 MG 24 hr tablet Take 1 tablet (25 mg total) by mouth daily. 90 tablet 3  . mirtazapine (REMERON) 15 MG tablet Take 0.5-1 tablets (7.5-15 mg total) by mouth at bedtime. FOR SLEEP 30 tablet 3  . Multiple Vitamin (MULTIVITAMIN) tablet Take 1 tablet by mouth daily.    . naproxen (NAPROSYN) 500 MG tablet Take 1 tablet (500 mg total) by mouth 2 (two) times daily as needed. 30 tablet 0  . potassium chloride SA (K-DUR,KLOR-CON) 20 MEQ tablet TAKE 2 TABLETS BY MOUTH DAILY 60 tablet 2  . ranitidine (ZANTAC) 300 MG tablet Take 300 mg by mouth at bedtime.     . Vilazodone HCl (VIIBRYD) 40 MG TABS TAKE 1 TABLET(40 MG) BY MOUTH DAILY 30 tablet 3  . linaclotide (LINZESS) 72 MCG capsule Take by mouth.     No current facility-administered medications for this visit.      Musculoskeletal: Strength & Muscle Tone: within normal limits Gait & Station: normal Patient leans: N/A  Psychiatric Specialty  Exam: Review of Systems  Psychiatric/Behavioral: The patient is not nervous/anxious.   All other systems reviewed and are negative.   Blood pressure 122/85, pulse 95, temperature 99.2 F (37.3 C), temperature source Oral, weight 155 lb 12.8 oz (70.7 kg), last menstrual period 09/30/1991.Body mass index is 29.44 kg/m.  General Appearance: Casual  Eye Contact:  Fair  Speech:  Clear and Coherent  Volume:  Normal  Mood:  Euthymic  Affect:  Appropriate  Thought Process:  Goal Directed and Descriptions of Associations: Intact  Orientation:  Full (Time, Place, and Person)  Thought Content: Logical   Suicidal Thoughts:  No  Homicidal Thoughts:  No  Memory:  Immediate;   Fair Recent;   Fair Remote;   Fair  Judgement:  Fair  Insight:  Fair  Psychomotor Activity:  Normal  Concentration:  Concentration: Fair and Attention Span: Fair  Recall:  [employer] of Knowledge: Fair  Language: Fair  Akathisia:  No  Handed:  Right  AIMS (if indicated): denies tremors, rigidity,stiffness  Assets:  Communication Skills Desire for Improvement Social Support  ADL's:  Intact  Cognition: WNL  Sleep:  Fair   Screenings: PHQ2-9     Procedure visit from 07/24/2017 in [facility] Visit from 07/16/2017 in [facility] Procedure visit from 06/28/2017 in [facility] Visit from 03/21/2017 in [facility] Procedure visit from 02/20/2017 in [facility]  PHQ-2 Total Score  0  0  0  0  0       Assessment and Plan: Jaxyn is a 64 year old Caucasian female, widowed, on SSD, lives in [place], has a  history of depression, sleep problems, chronic pain, multiple medical issues including SVT, presented to the clinic today for a follow-up visit.  Patient is currently making progress on the  current medication regimen.  Plan as noted below.  Plan For depression-stable Viibryd 40 mg p.o. daily.  For anxiety disorder-stable Viibryd 40 mg p.o. daily Hydroxyzine 25 mg p.o. 3 times daily as needed  For insomnia- stable Mirtazapine 7.5 to 15 mg p.o. nightly  For elevated heart rate On recheck her heart rate came down.  Patient will continue to follow-up with her cardiologist.  Typed up a letter for patient regarding the need for an emotional support animal.  Gave to patient.  Follow-up in clinic in 3 months or sooner if needed.  I have spent atleast 15 minutes  face to face with patient today. More than 50 % of the time was spent for psychoeducation and supportive psychotherapy and care coordination.  This note was generated in part or whole with voice recognition software. Voice recognition is usually quite accurate but there are transcription errors that can and very often do occur. I apologize for any typographical errors that were not detected and corrected.        Ursula Alert, MD 06/11/2018, 5:39 PM

## 2018-06-18 ENCOUNTER — Encounter: Payer: Self-pay | Admitting: Gastroenterology

## 2018-06-26 ENCOUNTER — Other Ambulatory Visit: Payer: Self-pay | Admitting: Internal Medicine

## 2018-07-08 ENCOUNTER — Ambulatory Visit: Payer: Medicare HMO | Admitting: Internal Medicine

## 2018-07-08 ENCOUNTER — Encounter: Payer: Self-pay | Admitting: Internal Medicine

## 2018-07-08 VITALS — BP 120/74 | HR 91 | Ht 61.0 in | Wt 155.5 lb

## 2018-07-08 DIAGNOSIS — E876 Hypokalemia: Secondary | ICD-10-CM | POA: Diagnosis not present

## 2018-07-08 DIAGNOSIS — R42 Dizziness and giddiness: Secondary | ICD-10-CM | POA: Diagnosis not present

## 2018-07-08 DIAGNOSIS — R0789 Other chest pain: Secondary | ICD-10-CM

## 2018-07-08 DIAGNOSIS — R6889 Other general symptoms and signs: Secondary | ICD-10-CM

## 2018-07-08 DIAGNOSIS — I10 Essential (primary) hypertension: Secondary | ICD-10-CM | POA: Insufficient documentation

## 2018-07-08 DIAGNOSIS — E785 Hyperlipidemia, unspecified: Secondary | ICD-10-CM | POA: Diagnosis not present

## 2018-07-08 NOTE — Patient Instructions (Signed)
Medication Instructions:  Your physician recommends that you continue on your current medications as directed. Please refer to the Current Medication list given to you today.  If you need a refill on your cardiac medications before your next appointment, please call your pharmacy.   Lab work: Your physician recommends that you return for lab work in: TODAY- CBC, CMP, TSH, LIPID.  If you have labs (blood work) drawn today and your tests are completely normal, you will receive your results only by: Marland Kitchen MyChart Message (if you have MyChart) OR . A paper copy in the mail If you have any lab test that is abnormal or we need to change your treatment, we will call you to review the results.  Testing/Procedures: NONE  Follow-Up: At [facility], you and your health needs are our priority.  As part of our continuing mission to provide you with exceptional heart care, we have created designated Provider Care Teams.  These Care Teams include your primary Cardiologist (physician) and Advanced Practice Providers (APPs -  Physician Assistants and Nurse Practitioners) who all work together to provide you with the care you need, when you need it. You will need a follow up appointment in 6 months.  Please call our office 2 months in advance to schedule this appointment.  You may see DR Harrell Gave END or one of the following Advanced Practice Providers on your designated Care Team:   Murray Hodgkins, NP Christell Faith, PA-C . Marrianne Mood, PA-C

## 2018-07-08 NOTE — Progress Notes (Signed)
Follow-up Outpatient Visit Date: 07/08/2018  Primary Care Provider: Gennette Pac, [address] [place] [id number]  Chief Complaint: Lightheadedness  HPI:  Ms. Cogan is a 63 y.o. year-old female with history of orthostatic hypotension, hyperlipidemia, and irregular heart beat, who presents for follow-up of orthostatic hypotension and palpitations.  I last saw her in November, at which time she noted continued intermittent palpitations.  She had not had any further falls or significant lightheadedness.  She was noted to be in sinus tachycardia, which had been an intermittent problem for many years.  Metoprolol succinate 25 mg daily was added, given that blood pressure was also mildly elevated.  Today, Ms. Chandran reports feeling quite well other than occasional episodes of orthostatic lightheadedness.  This most often occurs when she gets out of bed quickly and only lasts for a few seconds.  She has not fallen or passed out.  She reports sporadic left-sided chest pain that is not exertional.  It is often sharp and brought on by bending forward.  It resolves on its own in 30 seconds or less.  There are no accompanying symptoms.  She denies shortness of breath, orthopnea, PND, and edema.  Palpitations/rapid heart rate has improved considerably since metoprolol was added at our last visit.  --------------------------------------------------------------------------------------------------  Cardiovascular History & Procedures: Cardiovascular Problems:  Orthostatic lightheadedness/fall   Palpitations with PSVT on event monitor  Risk Factors:  Hyperlipidemia  Cath/PCI:  None  CV Surgery:  None  EP Procedures and Devices:  Cardiac event monitor (06/28/16): Predominantly sinus rhythm with rare PACs and PVCs, as well as paroxysmal SVT (lasting up to 10.8 seconds).  Non-Invasive Evaluation(s):  Pharmacologic MPI (09/29/16): Low risk study without ischemia or scar.  Nonspecific ST depression noted with regadenoson. LVEF 55-65%.   Bilateral carotid Doppler (05/31/16): Mild bilateral carotid atherosclerosis (<50%). Antegrade vertebral artery flow bilaterally.  TTE (05/29/16): Technically difficult study. Normal LV size and function (LVEF 60-65%). No significant valvular abnormalities (though suboptimally visualized). Normal RV size and function.  Recent CV Pertinent Labs: Lab Results  Component Value Date   CHOL 206 (H) 09/29/2016   HDL 41 09/29/2016   LDLCALC 106 (H) 09/29/2016   LDLDIRECT 197.3 02/01/2010   TRIG 296 (H) 09/29/2016   CHOLHDL 5.0 09/29/2016   K 3.6 10/04/2017   K 3.8 12/04/2012   MG 2.2 02/06/2017   BUN 8 10/04/2017   BUN 9 04/26/2017   BUN 16 12/04/2012   CREATININE 0.59 10/04/2017   CREATININE 0.64 12/04/2012    Past medical and surgical history were reviewed and updated in EPIC.  Current Meds  Medication Sig  . aspirin EC 81 MG tablet Take 81 mg by mouth daily.  Marland Kitchen atorvastatin (LIPITOR) 40 MG tablet Take 40 mg by mouth daily at 6 (six) AM.   . calcium carbonate (TUMS - DOSED IN MG ELEMENTAL CALCIUM) 500 MG chewable tablet Chew 1 tablet by mouth daily.  . Cholecalciferol (VITAMIN D3) 5000 units TABS Take 5,000 Units by mouth daily.  Marland Kitchen dexlansoprazole (DEXILANT) 60 MG capsule Take 60 mg by mouth daily.   . hydrOXYzine (VISTARIL) 25 MG capsule TAKE 1 CAPSULE(25 MG) BY MOUTH THREE TIMES DAILY AS NEEDED FOR ANXIETY  . linaclotide (LINZESS) 72 MCG capsule Take by mouth.  . metoprolol succinate (TOPROL XL) 25 MG 24 hr tablet Take 1 tablet (25 mg total) by mouth daily.  . mirtazapine (REMERON) 15 MG tablet Take 0.5-1 tablets (7.5-15 mg total) by mouth at bedtime. FOR SLEEP  .  Multiple Vitamin (MULTIVITAMIN) tablet Take 1 tablet by mouth daily.  . naproxen (NAPROSYN) 500 MG tablet Take 1 tablet (500 mg total) by mouth 2 (two) times daily as needed.  . potassium chloride SA (K-DUR,KLOR-CON) 20 MEQ tablet TAKE 2 TABLETS BY MOUTH  DAILY  . ranitidine (ZANTAC) 300 MG tablet Take 300 mg by mouth at bedtime.   . Vilazodone HCl (VIIBRYD) 40 MG TABS TAKE 1 TABLET(40 MG) BY MOUTH DAILY    Allergies: Levofloxacin; Mobic [meloxicam]; Propoxyphene hcl; Tramadol; and Codeine  Social History   Tobacco Use  . Smoking status: Former Smoker    Packs/day: 0.25    Years: 14.00    Pack years: 3.50    Types: Cigarettes    Last attempt to quit: 1992    Years since quitting: 28.1  . Smokeless tobacco: Former Systems developer    Quit date: 08/29/1990  Substance Use Topics  . Alcohol use: Yes    Comment: occasional; once every 6 months  . Drug use: No    Family History  Problem Relation Age of Onset  . Diabetes Mother   . COPD Mother   . Heart disease Mother   . Diabetes Father   . COPD Father   . Heart disease Father   . Heart attack Father 46       CABG  . Alcohol abuse Daughter   . Depression Daughter   . Drug abuse Daughter   . Anxiety disorder Daughter   . Tuberculosis Maternal Aunt   . Cancer Paternal Aunt        breast  . Breast cancer Paternal Aunt   . Diabetes Paternal Grandmother   . Diabetes Sister   . Cancer Cousin   . Diabetes Cousin   . Colon cancer Neg Hx   . Sudden Cardiac Death Neg Hx     Review of Systems: Ms. Kassem notes cold intolerance.  Otherwise, a 12-system review of systems was performed and was negative except as noted in the HPI.  --------------------------------------------------------------------------------------------------  Physical Exam: BP 120/74 (BP Location: Left Arm, Patient Position: Sitting, Cuff Size: Normal)   Pulse 91   Ht 5\' 1"  (1.549 m)   Wt 155 lb 8 oz (70.5 kg)   LMP 09/30/1991 (Approximate) Comment: 1993  BMI 29.38 kg/m   General:  NAD. HEENT: No conjunctival pallor or scleral icterus. Moist mucous membranes.  OP clear. Neck: Supple without lymphadenopathy, thyromegaly, JVD, or HJR. Lungs: Normal work of breathing. Clear to auscultation bilaterally without  wheezes or crackles. Heart: Regular rate and rhythm without murmurs, rubs, or gallops. Non-displaced PMI. Abd: Bowel sounds present. Soft, NT/ND without hepatosplenomegaly Ext: No lower extremity edema. Radial, PT, and DP pulses are 2+ bilaterally. Skin: Warm and dry without rash.  EKG:  NSR with non-specific ST changes.  Lab Results  Component Value Date   WBC 5.6 02/06/2017   HGB 12.9 02/06/2017   HCT 38.4 02/06/2017   MCV 97 02/06/2017   PLT 232 02/06/2017    Lab Results  Component Value Date   NA 138 10/04/2017   K 3.6 10/04/2017   CL 102 10/04/2017   CO2 27 10/04/2017   BUN 8 10/04/2017   CREATININE 0.59 10/04/2017   GLUCOSE 98 10/04/2017   ALT 29 08/09/2016    Lab Results  Component Value Date   CHOL 206 (H) 09/29/2016   HDL 41 09/29/2016   LDLCALC 106 (H) 09/29/2016   LDLDIRECT 197.3 02/01/2010   TRIG 296 (H) 09/29/2016   CHOLHDL 5.0  09/29/2016    --------------------------------------------------------------------------------------------------  ASSESSMENT AND PLAN: Orthostatic lightheadedness Longstanding and minimal at this time.  We will continue current dose of metoprolol.  I have encouraged continued aggressive hydration.  Atypical chest pain Most consistent with musculoskeletal pain.  Myocardial perfusion stress test in 09/2016 was low risk; no further ischemia evaluation at this time unless symptoms progress.  Cold intolerance Could be due to a number of factors.  She has been anemic in the past and also exhibited variable thyroid function.  I will check a TSH and CBC today.  Hyperlipidemia Patient remains on statin therapy for primary prevention.  I will check a CMP and lipid panel today.  Hypertension BP normal today.  Continue metoprolol succinate 25 mg daily.  Hypokalemia Long-standing with prior workup by nephrology and endocrinology.  Recheck renal function and potassium today, given ongoing potassium supplementation.  Follow-up: Return  to clinic in 6 months.  Nelva Bush, MD 07/08/2018 9:57 AM

## 2018-07-09 LAB — CBC
Hematocrit: 39.1 % (ref 34.0–46.6)
Hemoglobin: 13.7 g/dL (ref 11.1–15.9)
MCH: 32.8 pg (ref 26.6–33.0)
MCHC: 35 g/dL (ref 31.5–35.7)
MCV: 94 fL (ref 79–97)
PLATELETS: 236 10*3/uL (ref 150–450)
RBC: 4.18 x10E6/uL (ref 3.77–5.28)
RDW: 12.3 % (ref 11.7–15.4)
WBC: 7.4 10*3/uL (ref 3.4–10.8)

## 2018-07-09 LAB — COMPREHENSIVE METABOLIC PANEL
A/G RATIO: 1.5 (ref 1.2–2.2)
ALBUMIN: 4.3 g/dL (ref 3.8–4.8)
ALK PHOS: 82 IU/L (ref 39–117)
ALT: 24 IU/L (ref 0–32)
AST: 31 IU/L (ref 0–40)
BILIRUBIN TOTAL: 0.5 mg/dL (ref 0.0–1.2)
BUN / CREAT RATIO: 16 (ref 12–28)
BUN: 12 mg/dL (ref 8–27)
CHLORIDE: 105 mmol/L (ref 96–106)
CO2: 23 mmol/L (ref 20–29)
Calcium: 9.4 mg/dL (ref 8.7–10.3)
Creatinine, Ser: 0.76 mg/dL (ref 0.57–1.00)
GFR calc non Af Amer: 84 mL/min/{1.73_m2} (ref >59)
GFR, EST AFRICAN AMERICAN: 97 mL/min/{1.73_m2} (ref >59)
GLUCOSE: 85 mg/dL (ref 65–99)
Globulin, Total: 2.8 g/dL (ref 1.5–4.5)
POTASSIUM: 4.4 mmol/L (ref 3.5–5.2)
Sodium: 145 mmol/L — ABNORMAL HIGH (ref 134–144)
Total Protein: 7.1 g/dL (ref 6.0–8.5)

## 2018-07-09 LAB — LIPID PANEL
CHOLESTEROL TOTAL: 198 mg/dL (ref 100–199)
Chol/HDL Ratio: 4.3 ratio (ref 0.0–4.4)
HDL: 46 mg/dL (ref >39)
LDL Calculated: 78 mg/dL (ref 0–99)
Triglycerides: 371 mg/dL — ABNORMAL HIGH (ref 0–149)
VLDL CHOLESTEROL CAL: 74 mg/dL — AB (ref 5–40)

## 2018-07-09 LAB — TSH: TSH: 2.56 u[IU]/mL (ref 0.450–4.500)

## 2018-07-26 ENCOUNTER — Other Ambulatory Visit: Payer: Self-pay | Admitting: Psychiatry

## 2018-07-26 DIAGNOSIS — F411 Generalized anxiety disorder: Secondary | ICD-10-CM

## 2018-08-01 DIAGNOSIS — C44300 Unspecified malignant neoplasm of skin of unspecified part of face: Secondary | ICD-10-CM | POA: Insufficient documentation

## 2018-08-01 DIAGNOSIS — Z85828 Personal history of other malignant neoplasm of skin: Secondary | ICD-10-CM | POA: Insufficient documentation

## 2018-08-01 HISTORY — DX: Unspecified malignant neoplasm of skin of unspecified part of face: C44.300

## 2018-09-10 ENCOUNTER — Encounter: Payer: Self-pay | Admitting: Psychiatry

## 2018-09-10 ENCOUNTER — Other Ambulatory Visit: Payer: Self-pay

## 2018-09-10 ENCOUNTER — Ambulatory Visit (INDEPENDENT_AMBULATORY_CARE_PROVIDER_SITE_OTHER): Payer: Medicare HMO | Admitting: Psychiatry

## 2018-09-10 DIAGNOSIS — F331 Major depressive disorder, recurrent, moderate: Secondary | ICD-10-CM | POA: Diagnosis not present

## 2018-09-10 DIAGNOSIS — F4010 Social phobia, unspecified: Secondary | ICD-10-CM

## 2018-09-10 DIAGNOSIS — F411 Generalized anxiety disorder: Secondary | ICD-10-CM

## 2018-09-10 MED ORDER — MIRTAZAPINE 15 MG PO TABS: 15.0000 mg | ORAL_TABLET | Freq: Every day | ORAL | 2 refills | Status: DC

## 2018-09-10 MED ORDER — VILAZODONE HCL 40 MG PO TABS: ORAL_TABLET | ORAL | 3 refills | Status: DC

## 2018-09-10 MED ORDER — ARIPIPRAZOLE 2 MG PO TABS: 2.0000 mg | ORAL_TABLET | Freq: Every day | ORAL | 3 refills | Status: DC

## 2018-09-10 NOTE — Patient Instructions (Signed)
Aripiprazole tablets What is this medicine? ARIPIPRAZOLE (ay ri PIP ray zole) is an atypical antipsychotic. It is used to treat schizophrenia and bipolar disorder, also known as manic-depression. It is also used to treat Tourette's disorder and some symptoms of autism. This medicine may also be used in combination with antidepressants to treat major depressive disorder. This medicine may be used for other purposes; ask your health care provider or pharmacist if you have questions. COMMON BRAND NAME(S): Abilify What should I tell my health care provider before I take this medicine? They need to know if you have any of these conditions: -dehydration -dementia -diabetes -heart disease -history of stroke -low blood counts, like low white cell, platelet, or red cell counts -Parkinson's disease -seizures -suicidal thoughts, plans, or attempt; a previous suicide attempt by you or a family member -an unusual or allergic reaction to aripiprazole, other medicines, foods, dyes, or preservatives -pregnant or trying to get pregnant -breast-feeding How should I use this medicine? Take this medicine by mouth with a glass of water. Follow the directions on the prescription label. You can take this medicine with or without food. Take your doses at regular intervals. Do not take your medicine more often than directed. Do not stop taking except on the advice of your doctor or health care professional. A special MedGuide will be given to you by the pharmacist with each prescription and refill. Be sure to read this information carefully each time. Talk to your pediatrician regarding the use of this medicine in children. While this drug may be prescribed for children as young as 6 years of age for selected conditions, precautions do apply. Overdosage: If you think you have taken too much of this medicine contact a poison control center or emergency room at once. NOTE: This medicine is only for you. Do not share  this medicine with others. What if I miss a dose? If you miss a dose, take it as soon as you can. If it is almost time for your next dose, take only that dose. Do not take double or extra doses. What may interact with this medicine? Do not take this medicine with any of the following medications: -brexpiprazole -cisapride -dofetilide -dronedarone -metoclopramide -pimozide -thioridazine This medicine may also interact with the following medications: -alcohol -carbamazepine -certain medicines for anxiety or sleep -certain medicines for blood pressure -certain medicines for fungal infections like ketoconazole, fluconazole, posaconazole, and itraconazole -clarithromycin -fluoxetine -other medicines that prolong the QT interval (cause an abnormal heart rhythm) -paroxetine -quinidine -rifampin This list may not describe all possible interactions. Give your health care provider a list of all the medicines, herbs, non-prescription drugs, or dietary supplements you use. Also tell them if you smoke, drink alcohol, or use illegal drugs. Some items may interact with your medicine. What should I watch for while using this medicine? Visit your doctor or health care professional for regular checks on your progress. It may be several weeks before you see the full effects of this medicine. Do not suddenly stop taking this medicine. You may need to gradually reduce the dose. Patients and their families should watch out for worsening depression or thoughts of suicide. Also watch out for sudden changes in feelings such as feeling anxious, agitated, panicky, irritable, hostile, aggressive, impulsive, severely restless, overly excited and hyperactive, or not being able to sleep. If this happens, especially at the beginning of antidepressant treatment or after a change in dose, call your health care professional. You may get dizzy or drowsy. Do   not drive, use machinery, or do anything that needs mental  alertness until you know how this medicine affects you. Do not stand or sit up quickly, especially if you are an older patient. This reduces the risk of dizzy or fainting spells. Alcohol can increase dizziness and drowsiness. Avoid alcoholic drinks. This medicine can reduce the response of your body to heat or cold. Dress warmly in cold weather and stay hydrated in hot weather. If possible, avoid extreme temperatures like saunas, hot tubs, very hot or cold showers, or activities that can cause dehydration such as vigorous exercise. This medicine may cause dry eyes and blurred vision. If you wear contact lenses, you may feel some discomfort. Lubricating drops may help. See your eye doctor if the problem does not go away or is severe. If you notice an increased hunger or thirst, different from your normal hunger or thirst, or if you find that you have to urinate more frequently, you should contact your health care provider as soon as possible. You may need to have your blood sugar monitored. This medicine may cause changes in your blood sugar levels. You should monitor your blood sugar frequently if you have diabetes. There have been reports of uncontrollable and strong urges to gamble, binge eat, shop, and have sex while taking this medicine. If you experience any of these or other uncontrollable and strong urges while taking this medicine, you should report it to your health care provider as soon as possible. What side effects may I notice from receiving this medicine? Side effects that you should report to your doctor or health care professional as soon as possible: -allergic reactions like skin rash, itching or hives, swelling of the face, lips, or tongue -breathing problems -confusion -feeling faint or lightheaded, falls -fever or chills, sore throat -increased hunger or thirst -increased urination -joint pain -muscles pain, spasms -problems with balance, talking, walking -restlessness or need to  keep moving -seizures -suicidal thoughts or other mood changes -trouble swallowing -uncontrollable and excessive urges (examples: gambling, binge eating, shopping, having sex) -uncontrollable head, mouth, neck, arm, or leg movements -unusually weak or tired Side effects that usually do not require medical attention (report to your doctor or health care professional if they continue or are bothersome): -blurred vision -constipation -headache -nausea, vomiting -trouble sleeping -weight gain This list may not describe all possible side effects. Call your doctor for medical advice about side effects. You may report side effects to FDA at 1-800-FDA-1088. Where should I keep my medicine? Keep out of the reach of children. Store at room temperature between 15 and 30 degrees C (59 and 86 degrees F). Throw away any unused medicine after the expiration date. NOTE: This sheet is a summary. It may not cover all possible information. If you have questions about this medicine, talk to your doctor, pharmacist, or health care provider.  2019 Elsevier/Gold Standard (2017-11-08 09:13:39)

## 2018-09-10 NOTE — Progress Notes (Signed)
TC on 09-10-18 @ 1:34 pt medical and surgical hx were reviewed with no changes.  Allergies were reviewed with no new changes. Pt medications and pharmacy were reviewed with updates. No vitals taken due to this is a phone consult.

## 2018-09-10 NOTE — Progress Notes (Signed)
Virtual Visit via Telephone Note  I connected with [name] on 09/10/18 at  3:30 PM EDT by telephone and verified that I am speaking with the correct person using two identifiers.   I discussed the limitations, risks, security and privacy concerns of performing an evaluation and management service by telephone and the availability of in person appointments. I also discussed with the patient that there may be a patient responsible charge related to this service. The patient expressed understanding and agreed to proceed.    I discussed the assessment and treatment plan with the patient. The patient was provided an opportunity to ask questions and all were answered. The patient agreed with the plan and demonstrated an understanding of the instructions.   The patient was advised to call back or seek an in-person evaluation if the symptoms worsen or if the condition fails to improve as anticipated.   [place] MD  OP Progress Note  09/10/2018 3:35 PM MARSHE SHRESTHA  MRN:  [id number]  Chief Complaint:  Chief Complaint    Follow-up; Insomnia     HPI: Wanda [name] is a 63 year old Caucasian female, widowed, on SSD, lives in [place] , has a history of depression, anxiety, SVT, orthostatic hypertension, hyperlipidemia was evaluated by phone today.  Patient today reports she is currently do not doing well.  She reports she is having crying spells, nervousness, sleep problems and so on since the past few days.  She reports she is currently going through relationship problems with her daughter.  Her daughter has completely blocked her out and is not responding to any of her phone calls anymore.  She reports her daughter struggles with alcoholism and she tried to help her and that is when the problem started.  Patient reports it is too much for her to cope with and since then has been having these mood symptoms.  Patient reports she does not have any suicidality or homicidality.  Patient denies any perceptual  disturbances.  Patient is interested in psychotherapy sessions and reports she has not been able to schedule an appointment with a therapist as she had discussed with Probation officer during last visit.  Discussed with her that she can be referred to therapist here in clinic.  Discussed adding Abilify.  She agrees with plan.  She denies any other concerns today. Visit Diagnosis:    ICD-10-CM   1. GAD (generalized anxiety disorder) F41.1 Vilazodone HCl (VIIBRYD) 40 MG TABS    mirtazapine (REMERON) 15 MG tablet  2. MDD (major depressive disorder), recurrent episode, moderate (HCC) F33.1 Vilazodone HCl (VIIBRYD) 40 MG TABS    mirtazapine (REMERON) 15 MG tablet    ARIPiprazole (ABILIFY) 2 MG tablet  3. Social anxiety disorder F40.10     Past Psychiatric History: Reviewed past psychiatric history from my progress note on 08/21/2017.  Past trials of Zoloft, Cymbalta, Effexor, Paxil, Prozac, Wellbutrin, Xanax, prazosin, Seroquel, trazodone, doxepin  Past Medical History:  Past Medical History:  Diagnosis Date  . Anxiety   . Borderline diabetes   . BRONCHITIS, ACUTE WITH BRONCHOSPASM 06/25/2010   Qualifier: Diagnosis of  By: Alveta Heimlich MD, Cornelia Copa    . Chronic prescription benzodiazepine use 10/18/2015  . Depression   . GASTROENTERITIS WITHOUT DEHYDRATION 05/14/2009   Qualifier: Diagnosis of  By: Deborra Medina MD, Tanja Port    . GERD (gastroesophageal reflux disease)   . Herpes   . 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 Hyperlipidemia   . Irregular heart beat   .  Sinus tachycardia 04/11/2017  . Skin cancer of face 08/01/2018  . 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 Dossie Arbour Serial number: [id number] Model number: [id number]     Past Surgical History:  Procedure Laterality Date  . ABDOMINAL HYSTERECTOMY    . APPENDECTOMY    . BACK SURGERY     X 3  . BREAST EXCISIONAL BIOPSY Right    surgical bx age 49   .  COLONOSCOPY N/A 08/28/2013   Procedure: COLONOSCOPY;  Surgeon: Danie Binder, MD;  Location: [facility];  Service: Endoscopy;  Laterality: N/A;  8:30 AM  . INTRAMEDULLARY (IM) NAIL INTERTROCHANTERIC Left 05/30/2016   Procedure: INTRAMEDULLARY (IM) NAIL INTERTROCHANTRIC;  Surgeon: Thornton Park, MD;  Location: [facility] ORS;  Service: Orthopedics;  Laterality: Left;  . SHOULDER SURGERY    . Spinal cord stimulator      Family Psychiatric History: I have reviewed family psychiatric history from my progress note on 08/21/2017.  Family History:  Family History  Problem Relation Age of Onset  . Diabetes Mother   . COPD Mother   . Heart disease Mother   . Diabetes Father   . COPD Father   . Heart disease Father   . Heart attack Father 62       CABG  . Alcohol abuse Daughter   . Depression Daughter   . Drug abuse Daughter   . Anxiety disorder Daughter   . Tuberculosis Maternal Aunt   . Cancer Paternal Aunt        breast  . Breast cancer Paternal Aunt   . Diabetes Paternal Grandmother   . Diabetes Sister   . Cancer Cousin   . Diabetes Cousin   . Colon cancer Neg Hx   . Sudden Cardiac Death Neg Hx     Social History: Reviewed social history from my progress note on 08/21/2017. Social History   Socioeconomic History  . Marital status: Divorced    Spouse name: Not on file  . Number of children: 2  . Years of education: Not on file  . Highest education level: High school graduate  Occupational History    Comment: disabled  Social Needs  . Financial resource strain: Somewhat hard  . Food insecurity:    Worry: Never true    Inability: Never true  . Transportation needs:    Medical: No    Non-medical: No  Tobacco Use  . Smoking status: Former Smoker    Packs/day: 0.25    Years: 14.00    Pack years: 3.50    Types: Cigarettes    Last attempt to quit: 1992    Years since quitting: 28.3  . Smokeless tobacco: Former Systems developer    Quit date: 08/29/1990  Substance and Sexual Activity   . Alcohol use: Yes    Comment: occasional; once every 6 months  . Drug use: No  . Sexual activity: Yes    Birth control/protection: Surgical  Lifestyle  . Physical activity:    Days per week: 0 days    Minutes per session: 0 min  . Stress: Very much  Relationships  . Social connections:    Talks on phone: Twice a week    Gets together: Never    Attends religious service: More than 4 times per year    Active member of club or organization: Yes    Attends meetings of clubs or organizations: Never    Relationship status: Divorced  Other  Topics Concern  . Not on file  Social History Narrative  . Not on file    Allergies:  Allergies  Allergen Reactions  . Levofloxacin     REACTION: N \\T \ V  . Mobic [Meloxicam] Other (See Comments)    Calf tightness  . Propoxyphene Hcl     REACTION: N \\T \ V  . Tramadol Other (See Comments)    Severe headache  . Codeine Nausea Only    Slight nausea    Metabolic Disorder Labs: Lab Results  Component Value Date   HGBA1C 5.4 05/29/2016   MPG 108 05/29/2016   No results found for: PROLACTIN Lab Results  Component Value Date   CHOL 198 07/08/2018   TRIG 371 (H) 07/08/2018   HDL 46 07/08/2018   CHOLHDL 4.3 07/08/2018   VLDL 59 (H) 09/29/2016   LDLCALC 78 07/08/2018   LDLCALC 106 (H) 09/29/2016   Lab Results  Component Value Date   TSH 2.560 07/08/2018   TSH 2.050 10/18/2016    Therapeutic Level Labs: No results found for: LITHIUM No results found for: VALPROATE No components found for:  CBMZ  Current Medications: Current Outpatient Medications  Medication Sig Dispense Refill  . aspirin EC 81 MG tablet Take 81 mg by mouth daily.    Marland Kitchen atorvastatin (LIPITOR) 40 MG tablet Take 40 mg by mouth daily at 6 (six) AM.     . calcium carbonate (TUMS - DOSED IN MG ELEMENTAL CALCIUM) 500 MG chewable tablet Chew 1 tablet by mouth daily.    . Cholecalciferol (VITAMIN D3) 5000 units TABS Take 5,000 Units by mouth daily.    Marland Kitchen  dexlansoprazole (DEXILANT) 60 MG capsule Take 60 mg by mouth daily.     . hydrOXYzine (VISTARIL) 25 MG capsule TAKE 1 CAPSULE(25 MG) BY MOUTH THREE TIMES DAILY AS NEEDED FOR ANXIETY 90 capsule 1  . metoprolol succinate (TOPROL XL) 25 MG 24 hr tablet Take 1 tablet (25 mg total) by mouth daily. 90 tablet 3  . Multiple Vitamin (MULTIVITAMIN) tablet Take 1 tablet by mouth daily.    . naproxen (NAPROSYN) 500 MG tablet Take 1 tablet (500 mg total) by mouth 2 (two) times daily as needed. 30 tablet 0  . potassium chloride SA (K-DUR,KLOR-CON) 20 MEQ tablet TAKE 2 TABLETS BY MOUTH DAILY 60 tablet 2  . ranitidine (ZANTAC) 300 MG tablet Take 300 mg by mouth at bedtime.     . Vilazodone HCl (VIIBRYD) 40 MG TABS TAKE 1 TABLET(40 MG) BY MOUTH DAILY 30 tablet 3  . ARIPiprazole (ABILIFY) 2 MG tablet Take 1 tablet (2 mg total) by mouth daily with breakfast. FOR MOOD SYMPTOMS 30 tablet 3  . linaclotide (LINZESS) 72 MCG capsule Take by mouth.    . mirtazapine (REMERON) 15 MG tablet Take 1 tablet (15 mg total) by mouth at bedtime. FOR SLEEP 30 tablet 2   No current facility-administered medications for this visit.      Musculoskeletal: Strength & Muscle Tone: UTA Gait & Station: UTA Patient leans: N/A  Psychiatric Specialty Exam: Review of Systems  Psychiatric/Behavioral: Positive for depression. The patient is nervous/anxious and has insomnia.   All other systems reviewed and are negative.   Last menstrual period 09/30/1991.There is no height or weight on file to calculate BMI.  General Appearance: UTA  Eye Contact:  UTA  Speech:  Clear and Coherent  Volume:  Decreased  Mood:  Anxious and Depressed  Affect:  UTA  Thought Process:  Goal Directed and Descriptions of  Associations: Intact  Orientation:  Full (Time, Place, and Person)  Thought Content: Logical   Suicidal Thoughts:  No  Homicidal Thoughts:  No  Memory:  Immediate;   Fair Recent;   Fair Remote;   Fair  Judgement:  Fair  Insight:  Fair   Psychomotor Activity:  UTA  Concentration:  Concentration: Fair and Attention Span: Fair  Recall:  [employer] of Knowledge: Fair  Language: Fair  Akathisia:  No  Handed:  Right  AIMS (if indicated): UTA  Assets:  Communication Skills Desire for Improvement Housing  ADL's:  Intact  Cognition: WNL  Sleep:  Poor   Screenings: PHQ2-9     Procedure visit from 07/24/2017 in [facility] Office Visit from 07/16/2017 in [facility] Procedure visit from 06/28/2017 in [facility] Office Visit from 03/21/2017 in [facility] Procedure visit from 02/20/2017 in [facility]  PHQ-2 Total Score  0  0  0  0  0       Assessment and Plan: Wanda [name] is a 63 year old Caucasian female, widowed, lives in [place], has a history of depression, anxiety, chronic pain, insomnia, SVTs, who was evaluated by phone today.  Patient is currently going through relationship struggles and is currently struggling with mood symptoms as well as sleep issues.  She will benefit from psychotherapy sessions and hence will refer her for the same.  She will also benefit from medication changes as noted below.  Plan For depression- worsening Viibryd 40 mg p.o. daily Start Abilify 2 mg p.o. daily. Refer for CBT with therapist here in clinic.  For anxiety disorder-unstable Viibryd as prescribed Hydroxyzine 25 mg p.o. 3 times daily as needed  For insomnia-unstable Continue mirtazapine 7.5 to 15 mg p.o. nightly  Will refer her for CBT with therapist here in clinic.  Follow-up in clinic in 3 to 4 weeks or sooner if needed.  I have spent atleast 15 minutes non face to face with patient today. More than 50 % of the time was spent for psychoeducation and supportive psychotherapy and care coordination.  This note was  generated in part or whole with voice recognition software. Voice recognition is usually quite accurate but there are transcription errors that can and very often do occur. I apologize for any typographical errors that were not detected and corrected.        Ursula Alert, MD 09/10/2018, 3:35 PM

## 2018-09-19 ENCOUNTER — Ambulatory Visit (INDEPENDENT_AMBULATORY_CARE_PROVIDER_SITE_OTHER): Payer: Medicare HMO | Admitting: Licensed Clinical Social Worker

## 2018-09-19 ENCOUNTER — Other Ambulatory Visit: Payer: Self-pay

## 2018-09-19 ENCOUNTER — Encounter: Payer: Self-pay | Admitting: Licensed Clinical Social Worker

## 2018-09-19 DIAGNOSIS — F331 Major depressive disorder, recurrent, moderate: Secondary | ICD-10-CM

## 2018-09-19 DIAGNOSIS — F411 Generalized anxiety disorder: Secondary | ICD-10-CM
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
## 2018-09-24 ENCOUNTER — Other Ambulatory Visit: Payer: Self-pay | Admitting: Psychiatry

## 2018-09-24 ENCOUNTER — Other Ambulatory Visit: Payer: Self-pay | Admitting: Internal Medicine

## 2018-09-24 DIAGNOSIS — F411 Generalized anxiety disorder: Secondary | ICD-10-CM

## 2018-10-02 ENCOUNTER — Other Ambulatory Visit: Payer: Self-pay

## 2018-10-02 ENCOUNTER — Encounter: Payer: Self-pay | Admitting: Licensed Clinical Social Worker

## 2018-10-02 ENCOUNTER — Ambulatory Visit (INDEPENDENT_AMBULATORY_CARE_PROVIDER_SITE_OTHER): Payer: Medicare HMO | Admitting: Licensed Clinical Social Worker

## 2018-10-02 DIAGNOSIS — F411 Generalized anxiety disorder: Secondary | ICD-10-CM | POA: Diagnosis not present
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
## 2018-10-09 ENCOUNTER — Other Ambulatory Visit: Payer: Self-pay

## 2018-10-09 ENCOUNTER — Encounter: Payer: Self-pay | Admitting: Psychiatry

## 2018-10-09 ENCOUNTER — Ambulatory Visit (INDEPENDENT_AMBULATORY_CARE_PROVIDER_SITE_OTHER): Payer: Medicare HMO | Admitting: Psychiatry

## 2018-10-09 DIAGNOSIS — F4010 Social phobia, unspecified: Secondary | ICD-10-CM | POA: Diagnosis not present

## 2018-10-09 DIAGNOSIS — F331 Major depressive disorder, recurrent, moderate: Secondary | ICD-10-CM | POA: Diagnosis not present

## 2018-10-09 DIAGNOSIS — F411 Generalized anxiety disorder: Secondary | ICD-10-CM | POA: Diagnosis not present

## 2018-10-09 NOTE — Progress Notes (Signed)
Virtual Visit via Telephone Note  I connected with Wanda [name] on 10/09/18 at  4:30 PM EDT by telephone and verified that I am speaking with the correct person using two identifiers.   I discussed the limitations, risks, security and privacy concerns of performing an evaluation and management service by telephone and the availability of in person appointments. I also discussed with the patient that there may be a patient responsible charge related to this service. The patient expressed understanding and agreed to proceed.    I discussed the assessment and treatment plan with the patient. The patient was provided an opportunity to ask questions and all were answered. The patient agreed with the plan and demonstrated an understanding of the instructions.   The patient was advised to call back or seek an in-person evaluation if the symptoms worsen or if the condition fails to improve as anticipated.   Hugo MD OP Progress Note  10/09/2018 4:47 PM Wanda [name]  MRN:  [id number]  Chief Complaint:  Chief Complaint    Follow-up     HPI: Wanda [name] is a 63 year old Caucasian female, widowed, on SSD, lives in [place], has a history of depression, anxiety, SVT, orthostatic hypertension, hyperlipidemia was evaluated by phone today.  Patient was offered video consult however was unable to connect.  Patient today reports she is currently doing well on the current medication regimen.  She denies any side effects and reports she is compliant on them.  She reports sleep is good.  Patient reports appetite is fair.  She reports she had a good Mother's Day.  She reports her daughter contacted her on Mother's Day after a long time.  Patient reports she has good support system from friends.  She has been going out for walks.  She denies any other concerns today. Visit Diagnosis:    ICD-10-CM   1. GAD (generalized anxiety disorder) F41.1   2. MDD (major depressive disorder), recurrent episode,  moderate (HCC) F33.1   3. Social anxiety disorder F40.10     Past Psychiatric History: I have reviewed past psychiatric history from my progress note on 08/21/2017.  Past trials of Zoloft, Cymbalta, Effexor, Paxil, Prozac, Wellbutrin, Xanax, prazosin, Seroquel, trazodone, doxepin.  Past Medical History:  Past Medical History:  Diagnosis Date  . Anxiety   . Borderline diabetes   . BRONCHITIS, ACUTE WITH BRONCHOSPASM 06/25/2010   Qualifier: Diagnosis of  By: Alveta Heimlich MD, Cornelia Copa    . Chronic prescription benzodiazepine use 10/18/2015  . Depression   . GASTROENTERITIS WITHOUT DEHYDRATION 05/14/2009   Qualifier: Diagnosis of  By: Deborra Medina MD, Tanja Port    . GERD (gastroesophageal reflux disease)   . Herpes   . 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 Hyperlipidemia   . Irregular heart beat   . Sinus tachycardia 04/11/2017  . Skin cancer of face 08/01/2018  . 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 Dossie Arbour Serial number: [id number] Model number: [id number]     Past Surgical History:  Procedure Laterality Date  . ABDOMINAL HYSTERECTOMY    . APPENDECTOMY    . BACK SURGERY     X 3  . BREAST EXCISIONAL BIOPSY Right    surgical bx age 89   . COLONOSCOPY N/A 08/28/2013   Procedure: COLONOSCOPY;  Surgeon: Danie Binder, MD;  Location: [facility];  Service: Endoscopy;  Laterality: N/A;  8:30 AM  . INTRAMEDULLARY (IM) NAIL  INTERTROCHANTERIC Left 05/30/2016   Procedure: INTRAMEDULLARY (IM) NAIL INTERTROCHANTRIC;  Surgeon: [place] Park, MD;  Location: [facility] ORS;  Service: Orthopedics;  Laterality: Left;  . SHOULDER SURGERY    . Spinal cord stimulator      Family Psychiatric History: I have reviewed family psychiatric history from my progress note on 08/21/2017. Family History:  Family History  Problem Relation Age of Onset  . Diabetes Mother   . COPD Mother   . Heart disease Mother   . Diabetes  Father   . COPD Father   . Heart disease Father   . Heart attack Father 56       CABG  . Alcohol abuse Daughter   . Depression Daughter   . Drug abuse Daughter   . Anxiety disorder Daughter   . Tuberculosis Maternal Aunt   . Cancer Paternal Aunt        breast  . Breast cancer Paternal Aunt   . Diabetes Paternal Grandmother   . Diabetes Sister   . Cancer Cousin   . Diabetes Cousin   . Colon cancer Neg Hx   . Sudden Cardiac Death Neg Hx     Social History: I have reviewed social history from my progress note on 08/21/2017 Social History   Socioeconomic History  . Marital status: Divorced    Spouse name: Not on file  . Number of children: 2  . Years of education: Not on file  . Highest education level: High school graduate  Occupational History    Comment: disabled  Social Needs  . Financial resource strain: Somewhat hard  . Food insecurity:    Worry: Never true    Inability: Never true  . Transportation needs:    Medical: No    Non-medical: No  Tobacco Use  . Smoking status: Former Smoker    Packs/day: 0.25    Years: 14.00    Pack years: 3.50    Types: Cigarettes    Last attempt to quit: 1992    Years since quitting: 28.3  . Smokeless tobacco: Former Systems developer    Quit date: 08/29/1990  Substance and Sexual Activity  . Alcohol use: Yes    Comment: occasional; once every 6 months  . Drug use: No  . Sexual activity: Yes    Birth control/protection: Surgical  Lifestyle  . Physical activity:    Days per week: 0 days    Minutes per session: 0 min  . Stress: Very much  Relationships  . Social connections:    Talks on phone: Twice a week    Gets together: Never    Attends religious service: More than 4 times per year    Active member of club or organization: Yes    Attends meetings of clubs or organizations: Never    Relationship status: Divorced  Other Topics Concern  . Not on file  Social History Narrative  . Not on file    Allergies:  Allergies  Allergen  Reactions  . Levofloxacin     REACTION: N \\T \ V  . Mobic [Meloxicam] Other (See Comments)    Calf tightness  . Propoxyphene Hcl     REACTION: N \\T \ V  . Tramadol Other (See Comments)    Severe headache  . Codeine Nausea Only    Slight nausea    Metabolic Disorder Labs: Lab Results  Component Value Date   HGBA1C 5.4 05/29/2016   MPG 108 05/29/2016   No results found for: PROLACTIN Lab Results  Component Value Date  CHOL 198 07/08/2018   TRIG 371 (H) 07/08/2018   HDL 46 07/08/2018   CHOLHDL 4.3 07/08/2018   VLDL 59 (H) 09/29/2016   LDLCALC 78 07/08/2018   LDLCALC 106 (H) 09/29/2016   Lab Results  Component Value Date   TSH 2.560 07/08/2018   TSH 2.050 10/18/2016    Therapeutic Level Labs: No results found for: LITHIUM No results found for: VALPROATE No components found for:  CBMZ  Current Medications: Current Outpatient Medications  Medication Sig Dispense Refill  . alendronate (FOSAMAX) 10 MG tablet Take by mouth.    . cetirizine (ZYRTEC) 10 MG tablet Take by mouth.    . ARIPiprazole (ABILIFY) 2 MG tablet Take 1 tablet (2 mg total) by mouth daily with breakfast. FOR MOOD SYMPTOMS 30 tablet 3  . aspirin EC 81 MG tablet Take 81 mg by mouth daily.    Marland Kitchen atorvastatin (LIPITOR) 40 MG tablet Take 40 mg by mouth daily at 6 (six) AM.     . calcium carbonate (TUMS - DOSED IN MG ELEMENTAL CALCIUM) 500 MG chewable tablet Chew 1 tablet by mouth daily.    . Cholecalciferol (VITAMIN D3) 5000 units TABS Take 5,000 Units by mouth daily.    Marland Kitchen dexlansoprazole (DEXILANT) 60 MG capsule Take 60 mg by mouth daily.     . hydrOXYzine (VISTARIL) 25 MG capsule TAKE 1 CAPSULE(25 MG) BY MOUTH THREE TIMES DAILY AS NEEDED FOR ANXIETY 90 capsule 1  . linaclotide (LINZESS) 72 MCG capsule Take by mouth.    . metoprolol succinate (TOPROL XL) 25 MG 24 hr tablet Take 1 tablet (25 mg total) by mouth daily. 90 tablet 3  . mirtazapine (REMERON) 15 MG tablet Take 1 tablet (15 mg total) by mouth at  bedtime. FOR SLEEP 30 tablet 2  . Multiple Vitamin (MULTIVITAMIN) tablet Take 1 tablet by mouth daily.    . naproxen (NAPROSYN) 500 MG tablet Take 1 tablet (500 mg total) by mouth 2 (two) times daily as needed. 30 tablet 0  . potassium chloride SA (K-DUR) 20 MEQ tablet TAKE 2 TABLETS BY MOUTH DAILY 60 tablet 1  . ranitidine (ZANTAC) 300 MG tablet Take 300 mg by mouth at bedtime.     . triamcinolone cream (KENALOG) 0.1 % triamcinolone acetonide 0.1 % topical cream    . Vilazodone HCl (VIIBRYD) 40 MG TABS TAKE 1 TABLET(40 MG) BY MOUTH DAILY 30 tablet 3   No current facility-administered medications for this visit.      Musculoskeletal: Strength & Muscle Tone: UTA Gait & Station: UTA Patient leans: N/A  Psychiatric Specialty Exam: Review of Systems  Psychiatric/Behavioral: Positive for depression.  All other systems reviewed and are negative.   Last menstrual period 09/30/1991.There is no height or weight on file to calculate BMI.  General Appearance: UTA  Eye Contact:  UTA  Speech:  Clear and Coherent  Volume:  Normal  Mood:  Depressed improving  Affect:  UTA  Thought Process:  Goal Directed and Descriptions of Associations: Intact  Orientation:  Full (Time, Place, and Person)  Thought Content: Logical   Suicidal Thoughts:  No  Homicidal Thoughts:  No  Memory:  Immediate;   Fair Recent;   Fair Remote;   Fair  Judgement:  Good  Insight:  Fair  Psychomotor Activity:  UTA  Concentration:  Concentration: Fair and Attention Span: Fair  Recall:  [employer] of Knowledge: Fair  Language: Fair  Akathisia:  No  Handed:  Right  AIMS (if indicated): Denies tremors,  rigidity, stiffness  Assets:  Communication Skills Desire for Improvement Housing Social Support  ADL's:  Intact  Cognition: WNL  Sleep:  improving   Screenings: PHQ2-9     Procedure visit from 07/24/2017 in [facility] Visit from 07/16/2017 in [facility] Procedure visit from 06/28/2017 in [facility] Visit from 03/21/2017 in [facility] Procedure visit from 02/20/2017 in [facility]  PHQ-2 Total Score  0  0  0  0  0       Assessment and Plan: Lamaya is a 63 year old Caucasian female, widowed, lives in [place], has a history of depression, anxiety, chronic pain, insomnia, SVTs, was evaluated by.  Patient is currently going through relationship struggles however has been coping better.  She will continue to benefit from medications as well as therapy sessions.  Plan as noted below.  Plan For depression-improving Viibryd 40 mg p.o. daily Abilify 2 mg p.o. daily Referred for CBT with therapist here in clinic.  For anxiety disorder-improving Viibryd as prescribed Hydroxyzine 25 mg p.o. 3 times daily as needed.  For insomnia-improving Mirtazapine 7.5 to 15 mg p.o. nightly  Follow-up in clinic in 1 month or sooner if needed.  Appointment scheduled for June 25 at 4:45 PM  I have spent at least 15 minutes non-face-to-face with patient today. More than 50 % of the time was spent for psychoeducation and supportive psychotherapy and care coordination.  This note was generated in part or whole with voice recognition software. Voice recognition is usually quite accurate but there are transcription errors that can and very often do occur. I apologize for any typographical errors that were not detected and corrected.        Ursula Alert, MD 10/09/2018, 4:47 PM

## 2018-10-17 ENCOUNTER — Other Ambulatory Visit: Payer: Self-pay | Admitting: Pediatrics

## 2018-10-17 DIAGNOSIS — Z1231 Encounter for screening mammogram for malignant neoplasm of breast: Secondary | ICD-10-CM

## 2018-10-17 DIAGNOSIS — M810 Age-related osteoporosis without current pathological fracture: Secondary | ICD-10-CM

## 2018-10-23 ENCOUNTER — Encounter: Payer: Self-pay | Admitting: Licensed Clinical Social Worker

## 2018-10-23 ENCOUNTER — Other Ambulatory Visit: Payer: Self-pay

## 2018-10-23 ENCOUNTER — Ambulatory Visit (INDEPENDENT_AMBULATORY_CARE_PROVIDER_SITE_OTHER): Payer: Medicare HMO | Admitting: Licensed Clinical Social Worker

## 2018-10-23 DIAGNOSIS — F411 Generalized anxiety disorder: Secondary | ICD-10-CM | POA: Diagnosis not present

## 2018-10-23 DIAGNOSIS — F331 Major depressive disorder, recurrent, moderate: Secondary | ICD-10-CM | POA: Diagnosis not present

## 2018-10-23 NOTE — Progress Notes (Signed)
Virtual Visit via Telephone Note  I connected with Wanda [name] on 10/23/18 at  1:30 PM EDT by telephone and verified that I am speaking with the correct person using two identifiers.   I discussed the limitations, risks, security and privacy concerns of performing an evaluation and management service by telephone and the availability of in person appointments. I also discussed with the patient that there may be a patient responsible charge related to this service. The patient expressed understanding and agreed to proceed.  I discussed the assessment and treatment plan with the patient. The patient was provided an opportunity to ask questions and all were answered. The patient agreed with the plan and demonstrated an understanding of the instructions.   The patient was advised to call back or seek an in-person evaluation if the symptoms worsen or if the condition fails to improve as anticipated.  I provided 30 minutes of non-face-to-face time during this encounter.   Alden Hipp, LCSW    THERAPIST PROGRESS NOTE  Session Time: 1330  Participation Level: Active  Behavioral Response: NAAlertAnxious  Type of Therapy: Individual Therapy  Treatment Goals addressed: Coping  Interventions: Supportive  Summary: Wanda [name] is a 63 y.o. female who presents with continued symptoms related to her diagnosis. Laraine reports doing well since our last session, but noted, "I had a fall." Latanza reports she is in pain physically from a recent fall, but did not break anything and states she is doing okay otherwise. We discussed how physical health can relate to mental health, and LCSW encouraged Deva to give herself time to heal while utilizing coping mechanisms to maintain a positive mood. Alizay expressed understanding and agreement with this notion. Marnita reports feeling better about her relationship with her daughter as her daughter finally reached out and they were able to have a phone conversation  on two different occasions. Keirah reported feeling relieved that she was able to speak with her daughter, but frustrated as, "it was all about her, as usual." LCSW validated Alizay's feelings around the conversation, and encouraged her to communicate with her daughter to effectively relay her feelings on their discussions. Aneeka expressed hesitation as she feels she needs to "walk on eggshells," with her daughter. LCSW validated this feeling and told Shaneque to trust her instincts where her daughter is concerned, but also work towards being able to utilize more assertive communication in the future. Rakesha expressed agreement with this idea and added, "it would help if she wasn't drinking." LCSW validated that idea as well.  Suicidal/Homicidal: No  Therapist Response: Jacquette continues to work towards her tx goals but has not yet reached them. We will continue to work on emotional regulation skills and improving assertive communication skills.   Plan: Return again in 3 weeks.  Diagnosis: Axis I: Generalized Anxiety Disorder    Axis II: No diagnosis    Alden Hipp, LCSW 10/23/2018

## 2018-11-13 ENCOUNTER — Encounter: Payer: Self-pay | Admitting: Licensed Clinical Social Worker

## 2018-11-13 ENCOUNTER — Ambulatory Visit (INDEPENDENT_AMBULATORY_CARE_PROVIDER_SITE_OTHER): Payer: Medicare HMO | Admitting: Licensed Clinical Social Worker

## 2018-11-13 ENCOUNTER — Other Ambulatory Visit: Payer: Self-pay

## 2018-11-13 DIAGNOSIS — F411 Generalized anxiety disorder: Secondary | ICD-10-CM | POA: Diagnosis not present

## 2018-11-13 NOTE — Progress Notes (Signed)
Virtual Visit via Video Note  I connected with Wanda [name] on 11/13/18 at  1:30 PM EDT by a video enabled telemedicine application and verified that I am speaking with the correct person using two identifiers.   I discussed the limitations of evaluation and management by telemedicine and the availability of in person appointments. The patient expressed understanding and agreed to proceed.  I discussed the assessment and treatment plan with the patient. The patient was provided an opportunity to ask questions and all were answered. The patient agreed with the plan and demonstrated an understanding of the instructions.   The patient was advised to call back or seek an in-person evaluation if the symptoms worsen or if the condition fails to improve as anticipated.  I provided 25  minutes of non-face-to-face time during this encounter.   Alden Hipp, LCSW    THERAPIST PROGRESS NOTE  Session Time: 1330  Participation Level: Active  Behavioral Response: CasualAlertAnxious  Type of Therapy: Individual Therapy  Treatment Goals addressed: Anxiety  Interventions: Supportive  Summary: Wanda [name] is a 63 y.o. female who presents with continued symptoms related to her diagnosis. Wanda [name] reports doing well since our last session. She reports her mood has been more stable, and she believes her medications have been working effectively. She stated she asked the doctor in the clinic not to change her medications because she feels she is doing the best she has "in a long time." LCSW validated that idea and highlighted the way Wanda [name] was able to advocate for herself. Wanda [name] was able to recognize that as well and expressed pride in her ability to do that. Wanda [name] reports she has continued speaking with her daughter on a regular basis, and feels their relationship is improving. Wanda [name] also reported she got a puppy recently, which has "been so much fun." Wanda [name] reports feeling happiness associated with the  puppy. This led to a conversation around the healing power of animals and the ways that animals can contribute to a more positive mood. Wanda [name] expressed understanding and agreement with that idea as well.   Suicidal/Homicidal: No  Therapist Response: Wanda [name] continues to work towards her tx goals but has not yet reached them. We will continue to work on emotional regulation skills and managing negative emotions moving forward by utilizing CBT.   Plan: Return again in 4 weeks.  Diagnosis: Axis I: Generalized Anxiety Disorder    Axis II: No diagnosis    Alden Hipp, LCSW 11/13/2018

## 2018-11-21 ENCOUNTER — Other Ambulatory Visit: Payer: Self-pay

## 2018-11-21 ENCOUNTER — Ambulatory Visit (INDEPENDENT_AMBULATORY_CARE_PROVIDER_SITE_OTHER): Payer: Medicare HMO | Admitting: Psychiatry

## 2018-11-21 ENCOUNTER — Encounter: Payer: Self-pay | Admitting: Psychiatry

## 2018-11-21 DIAGNOSIS — F4010 Social phobia, unspecified: Secondary | ICD-10-CM | POA: Insufficient documentation

## 2018-11-21 DIAGNOSIS — F331 Major depressive disorder, recurrent, moderate: Secondary | ICD-10-CM | POA: Diagnosis not present

## 2018-11-21 DIAGNOSIS — F411 Generalized anxiety disorder: Secondary | ICD-10-CM | POA: Diagnosis not present

## 2018-11-21 MED ORDER — HYDROXYZINE PAMOATE 25 MG PO CAPS: ORAL_CAPSULE | ORAL | 3 refills | Status: DC

## 2018-11-21 MED ORDER — MIRTAZAPINE 15 MG PO TABS: 15.0000 mg | ORAL_TABLET | Freq: Every day | ORAL | 2 refills | Status: DC

## 2018-11-21 NOTE — Progress Notes (Signed)
Virtual Visit via Telephone Note  I connected with Wanda [name] on 11/21/18 at  4:45 PM EDT by telephone and verified that I am speaking with the correct person using two identifiers.   I discussed the limitations, risks, security and privacy concerns of performing an evaluation and management service by telephone and the availability of in person appointments. I also discussed with the patient that there may be a patient responsible charge related to this service. The patient expressed understanding and agreed to proceed.     I discussed the assessment and treatment plan with the patient. The patient was provided an opportunity to ask questions and all were answered. The patient agreed with the plan and demonstrated an understanding of the instructions.   The patient was advised to call back or seek an in-person evaluation if the symptoms worsen or if the condition fails to improve as anticipated.  [place] MD OP Progress Note  11/21/2018 6:13 PM Wanda [name]  MRN:  [id number]  Chief Complaint:  Chief Complaint    Follow-up     HPI: Wanda [name] is a 63 year old Caucasian female, widowed, on SSD, lives in [place], has a history of depression, anxiety, SVT, orthostatic hypotension, hyperlipidemia was evaluated by phone today.  Patient was offered video consult however declined.  Patient today reports she is currently doing well on the current medication regimen.  Patient denies any significant mood symptoms.  She denies any sadness or crying spells.  She reports sleep is good.  She reports she was able to get a puppy for herself.  She has been spending a lot of time with her puppy and that has been therapeutic for her.  She denies any suicidality, homicidality or perceptual disturbances.  She denies any other concerns today. Visit Diagnosis:    ICD-10-CM   1. GAD (generalized anxiety disorder)  F41.1 hydrOXYzine (VISTARIL) 25 MG capsule    mirtazapine (REMERON) 15 MG tablet  2. MDD  (major depressive disorder), recurrent episode, moderate (HCC)  F33.1 mirtazapine (REMERON) 15 MG tablet  3. Social anxiety disorder  F40.10     Past Psychiatric History: I have reviewed past psychiatric history from my progress note on 08/21/2017.  Past trials of Zoloft, Cymbalta, Effexor, Paxil, Prozac, Wellbutrin, Xanax, prazosin, Seroquel, trazodone, doxepin.  Past Medical History:  Past Medical History:  Diagnosis Date  . Anxiety   . Borderline diabetes   . BRONCHITIS, ACUTE WITH BRONCHOSPASM 06/25/2010   Qualifier: Diagnosis of  By: Alveta Heimlich MD, Cornelia Copa    . Chronic prescription benzodiazepine use 10/18/2015  . Depression   . GASTROENTERITIS WITHOUT DEHYDRATION 05/14/2009   Qualifier: Diagnosis of  By: Deborra Medina MD, Tanja Port    . GERD (gastroesophageal reflux disease)   . Herpes   . 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 Hyperlipidemia   . Irregular heart beat   . Sinus tachycardia 04/11/2017  . Skin cancer of face 08/01/2018  . 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 Dossie Arbour Serial number: [id number] Model number: [id number]     Past Surgical History:  Procedure Laterality Date  . ABDOMINAL HYSTERECTOMY    . APPENDECTOMY    . BACK SURGERY     X 3  . BREAST EXCISIONAL BIOPSY Right    surgical bx age 35   . COLONOSCOPY N/A 08/28/2013   Procedure: COLONOSCOPY;  Surgeon: Danie Binder, MD;  Location: [facility];  Service: Endoscopy;  Laterality:  N/A;  8:30 AM  . INTRAMEDULLARY (IM) NAIL INTERTROCHANTERIC Left 05/30/2016   Procedure: INTRAMEDULLARY (IM) NAIL INTERTROCHANTRIC;  Surgeon: Thornton Park, MD;  Location: [facility] ORS;  Service: Orthopedics;  Laterality: Left;  . SHOULDER SURGERY    . Spinal cord stimulator      Family Psychiatric History: I have reviewed family psychiatric history from my progress note on 08/21/2017.  Family History:  Family History  Problem Relation Age  of Onset  . Diabetes Mother   . COPD Mother   . Heart disease Mother   . Diabetes Father   . COPD Father   . Heart disease Father   . Heart attack Father 44       CABG  . Alcohol abuse Daughter   . Depression Daughter   . Drug abuse Daughter   . Anxiety disorder Daughter   . Tuberculosis Maternal Aunt   . Cancer Paternal Aunt        breast  . Breast cancer Paternal Aunt   . Diabetes Paternal Grandmother   . Diabetes Sister   . Cancer Cousin   . Diabetes Cousin   . Colon cancer Neg Hx   . Sudden Cardiac Death Neg Hx     Social History: I have reviewed social history from my progress note on 08/21/2017. Social History   Socioeconomic History  . Marital status: Divorced    Spouse name: Not on file  . Number of children: 2  . Years of education: Not on file  . Highest education level: High school graduate  Occupational History    Comment: disabled  Social Needs  . Financial resource strain: Somewhat hard  . Food insecurity    Worry: Never true    Inability: Never true  . Transportation needs    Medical: No    Non-medical: No  Tobacco Use  . Smoking status: Former Smoker    Packs/day: 0.25    Years: 14.00    Pack years: 3.50    Types: Cigarettes    Quit date: 1992    Years since quitting: 28.5  . Smokeless tobacco: Former Systems developer    Quit date: 08/29/1990  Substance and Sexual Activity  . Alcohol use: Yes    Comment: occasional; once every 6 months  . Drug use: No  . Sexual activity: Yes    Birth control/protection: Surgical  Lifestyle  . Physical activity    Days per week: 0 days    Minutes per session: 0 min  . Stress: Very much  Relationships  . Social Herbalist on phone: Twice a week    Gets together: Never    Attends religious service: More than 4 times per year    Active member of club or organization: Yes    Attends meetings of clubs or organizations: Never    Relationship status: Divorced  Other Topics Concern  . Not on file  Social  History Narrative  . Not on file    Allergies:  Allergies  Allergen Reactions  . Levofloxacin     REACTION: N \\T \ V  . Mobic [Meloxicam] Other (See Comments)    Calf tightness  . Propoxyphene Hcl     REACTION: N \\T \ V  . Tramadol Other (See Comments)    Severe headache  . Codeine Nausea Only    Slight nausea    Metabolic Disorder Labs: Lab Results  Component Value Date   HGBA1C 5.4 05/29/2016   MPG 108 05/29/2016   No results found  for: PROLACTIN Lab Results  Component Value Date   CHOL 198 07/08/2018   TRIG 371 (H) 07/08/2018   HDL 46 07/08/2018   CHOLHDL 4.3 07/08/2018   VLDL 59 (H) 09/29/2016   LDLCALC 78 07/08/2018   LDLCALC 106 (H) 09/29/2016   Lab Results  Component Value Date   TSH 2.560 07/08/2018   TSH 2.050 10/18/2016    Therapeutic Level Labs: No results found for: LITHIUM No results found for: VALPROATE No components found for:  CBMZ  Current Medications: Current Outpatient Medications  Medication Sig Dispense Refill  . alendronate (FOSAMAX) 10 MG tablet Take by mouth.    . ARIPiprazole (ABILIFY) 2 MG tablet Take 1 tablet (2 mg total) by mouth daily with breakfast. FOR MOOD SYMPTOMS 30 tablet 3  . aspirin EC 81 MG tablet Take 81 mg by mouth daily.    Marland Kitchen atorvastatin (LIPITOR) 40 MG tablet Take 40 mg by mouth daily at 6 (six) AM.     . calcium carbonate (TUMS - DOSED IN MG ELEMENTAL CALCIUM) 500 MG chewable tablet Chew 1 tablet by mouth daily.    . cetirizine (ZYRTEC) 10 MG tablet Take by mouth.    . Cholecalciferol (VITAMIN D3) 5000 units TABS Take 5,000 Units by mouth daily.    Marland Kitchen dexlansoprazole (DEXILANT) 60 MG capsule Take 60 mg by mouth daily.     . hydrOXYzine (VISTARIL) 25 MG capsule TAKE 1 CAPSULE(25 MG) BY MOUTH THREE TIMES DAILY AS NEEDED FOR ANXIETY 90 capsule 3  . linaclotide (LINZESS) 72 MCG capsule Take by mouth.    . metoprolol succinate (TOPROL XL) 25 MG 24 hr tablet Take 1 tablet (25 mg total) by mouth daily. 90 tablet 3  .  mirtazapine (REMERON) 15 MG tablet Take 1 tablet (15 mg total) by mouth at bedtime. FOR SLEEP 30 tablet 2  . Multiple Vitamin (MULTIVITAMIN) tablet Take 1 tablet by mouth daily.    . naproxen (NAPROSYN) 500 MG tablet Take 1 tablet (500 mg total) by mouth 2 (two) times daily as needed. 30 tablet 0  . potassium chloride SA (K-DUR) 20 MEQ tablet TAKE 2 TABLETS BY MOUTH DAILY 60 tablet 1  . ranitidine (ZANTAC) 300 MG tablet Take 300 mg by mouth at bedtime.     . triamcinolone cream (KENALOG) 0.1 % triamcinolone acetonide 0.1 % topical cream    . Vilazodone HCl (VIIBRYD) 40 MG TABS TAKE 1 TABLET(40 MG) BY MOUTH DAILY 30 tablet 3   No current facility-administered medications for this visit.      Musculoskeletal: Strength & Muscle Tone: UTA Gait & Station: Reports as WNL Patient leans: N/A  Psychiatric Specialty Exam: Review of Systems  Psychiatric/Behavioral: The patient is nervous/anxious.   All other systems reviewed and are negative.   Last menstrual period 09/30/1991.There is no height or weight on file to calculate BMI.  General Appearance: UTA  Eye Contact:  UTA  Speech:  Normal Rate  Volume:  Normal  Mood:  Anxious  Affect:  UTA  Thought Process:  Goal Directed and Descriptions of Associations: Intact  Orientation:  Full (Time, Place, and Person)  Thought Content: Logical   Suicidal Thoughts:  No  Homicidal Thoughts:  No  Memory:  Immediate;   Fair Recent;   Fair Remote;   Fair  Judgement:  Fair  Insight:  Fair  Psychomotor Activity:  UTA  Concentration:  Concentration: Fair and Attention Span: Fair  Recall:  [employer] of Knowledge: Fair  Language: Fair  Akathisia:  No  Handed:  Right  AIMS (if indicated): UTA  Assets:  Communication Skills Desire for Improvement Housing  ADL's:  Intact  Cognition: WNL  Sleep:  Fair   Screenings: PHQ2-9     Procedure visit from 07/24/2017 in [place] [facility] Visit from 07/16/2017  in [place] Procedure visit from 06/28/2017 in [place] [facility] Visit from 03/21/2017 in [place] Procedure visit from 02/20/2017 in [facility]  PHQ-2 Total Score  0  0  0  0  0       Assessment and Plan: Idali is a 63 year old Caucasian female, widowed, lives in [place], has a history of depression, anxiety, chronic pain, insomnia, SVTs was evaluated by telemedicine today.  Patient reports she is currently making progress on the current medication regimen.  Plan For depression-stable Viibryd 40 mg p.o. daily. Abilify 2 mg p.o. daily   For anxiety disorder-stable Viibryd as prescribed Hydroxyzine 25 mg p.o. 3 times daily as needed  For insomnia-stable Mirtazapine 7.5-15 mg p.o. nightly  Follow-up in clinic in 2 month or sooner if needed.  Appointment due for August 17 at 4 PM  I have spent atleast 15 minutes non face to face with patient today. More than 50 % of the time was spent for psychoeducation and supportive psychotherapy and care coordination.  This note was generated in part or whole with voice recognition software. Voice recognition is usually quite accurate but there are transcription errors that can and very often do occur. I apologize for any typographical errors that were not detected and corrected.        Ursula Alert, MD 11/21/2018, 6:13 PM

## 2018-11-23 ENCOUNTER — Other Ambulatory Visit: Payer: Self-pay | Admitting: Psychiatry

## 2018-11-23 ENCOUNTER — Other Ambulatory Visit: Payer: Self-pay | Admitting: Internal Medicine

## 2018-11-23 DIAGNOSIS — F411 Generalized anxiety disorder: Secondary | ICD-10-CM

## 2018-12-12 ENCOUNTER — Other Ambulatory Visit: Payer: Self-pay

## 2018-12-12 ENCOUNTER — Ambulatory Visit (INDEPENDENT_AMBULATORY_CARE_PROVIDER_SITE_OTHER): Payer: Medicare HMO | Admitting: Licensed Clinical Social Worker

## 2018-12-12 ENCOUNTER — Encounter: Payer: Self-pay | Admitting: Licensed Clinical Social Worker

## 2018-12-12 DIAGNOSIS — F411 Generalized anxiety disorder: Secondary | ICD-10-CM | POA: Diagnosis not present

## 2018-12-12 DIAGNOSIS — F4010 Social phobia, unspecified: Secondary | ICD-10-CM

## 2018-12-12 DIAGNOSIS — F331 Major depressive disorder, recurrent, moderate: Secondary | ICD-10-CM | POA: Diagnosis not present

## 2018-12-12 NOTE — Progress Notes (Signed)
Virtual Visit via Video Note  I connected with Wanda [name] on 12/12/18 at  1:30 PM EDT by a video enabled telemedicine application and verified that I am speaking with the correct person using two identifiers.   I discussed the limitations of evaluation and management by telemedicine and the availability of in person appointments. The patient expressed understanding and agreed to proceed.   I discussed the assessment and treatment plan with the patient. The patient was provided an opportunity to ask questions and all were answered. The patient agreed with the plan and demonstrated an understanding of the instructions.   The patient was advised to call back or seek an in-person evaluation if the symptoms worsen or if the condition fails to improve as anticipated.  I provided 30 minutes of non-face-to-face time during this encounter.   Alden Hipp, LCSW    THERAPIST PROGRESS NOTE  Session Time: 1250  Participation Level: Active  Behavioral Response: CasualAlertAnxious  Type of Therapy: Individual Therapy  Treatment Goals addressed: Anxiety  Interventions: Supportive  Summary: Wanda [name] is a 63 y.o. female who presents with continued symptoms of her diagnosis. Wanda [name] reports doing well since our last session. She reports she has continued to be adherent with all medications, and feels "the most stable I have in a really long time." LCSW validated and normalized those feelings and highlighted how finding the right medications can completely change your outlook on life in general. Wanda [name] expressed agreement and understanding. Wanda [name] moved on to discussing her relationship with her daughter, "she's trying to be more in control of herself, which has really helped." LCSW asked if the mended relationship has improved Wanda [name]'s mental health. Wanda [name] stated, "oh yes. She admitted what she did was wrong and it took me a moment to get over it. It took her calling me after I wrote her that letter.  She called me to discuss it and apologize. I walk on eggshells with her because I never know what she is going to say and do." LCSW validated those feelings and frustrations around her daughter's behavior.   Suicidal/Homicidal: No  Therapist Response: Wanda [name] continues to work towards her tx goals but has not yet reached them. We will continue to work on emotional regulation skills and communication skills.   Plan: Return again in 4 weeks.  Diagnosis: Axis I: Generalized Anxiety Disorder    Axis II: No diagnosis    Alden Hipp, LCSW 12/12/2018

## 2019-01-07 ENCOUNTER — Other Ambulatory Visit: Payer: Self-pay | Admitting: Psychiatry

## 2019-01-07 DIAGNOSIS — F331 Major depressive disorder, recurrent, moderate: Secondary | ICD-10-CM

## 2019-01-08 ENCOUNTER — Other Ambulatory Visit: Payer: Self-pay

## 2019-01-08 ENCOUNTER — Encounter: Payer: Self-pay | Admitting: Internal Medicine

## 2019-01-08 ENCOUNTER — Ambulatory Visit (INDEPENDENT_AMBULATORY_CARE_PROVIDER_SITE_OTHER): Payer: Medicare HMO | Admitting: Internal Medicine

## 2019-01-08 VITALS — BP 100/80 | HR 111 | Ht 61.0 in | Wt 151.8 lb

## 2019-01-08 DIAGNOSIS — R42 Dizziness and giddiness: Secondary | ICD-10-CM

## 2019-01-08 DIAGNOSIS — R000 Tachycardia, unspecified: Secondary | ICD-10-CM | POA: Diagnosis not present

## 2019-01-08 DIAGNOSIS — R079 Chest pain, unspecified: Secondary | ICD-10-CM

## 2019-01-08 DIAGNOSIS — R0789 Other chest pain: Secondary | ICD-10-CM

## 2019-01-08 NOTE — Progress Notes (Signed)
Follow-up Outpatient Visit Date: 01/08/2019  Primary Care Provider: Barbaraann Boys, MD [address] [place] [address]  Chief Complaint: Chest tightness  HPI:  Wanda [name] is a 63 y.o. year-old female with history of orthostatic hypotension, hyperlipidemia, and irregular heart beat, who presents for follow-up of palpitations and orthostatic lightheadedness.  I last saw her in February, at which time she was doing well other than occasional episodes of orthostatic lightheadedness, especially when first getting out of bed too quickly.  She also reported sporadic left-sided chest pain that was not exertional and felt to be musculoskeletal in nature.  We did not make any medication changes at that time.  Today, Wanda [name] reports that she has been feeling relatively well.  Since the beginning of the COVID-19 pandemic, she has noted occasional tightness in the the chest, especially when she becomes nervous or anxious.  She has not had any exertional chest pain or shortness of breath.  She also denies palpitations and lightheadedness.  She has been trying to stay well-hydrated.  She denies falls or syncope.  She is tolerating her current medications well.  --------------------------------------------------------------------------------------------------  Cardiovascular History & Procedures: Cardiovascular Problems:  Orthostatic lightheadedness/fall   Palpitations with PSVT on event monitor  Risk Factors:  Hyperlipidemia  Cath/PCI:  None  CV Surgery:  None  EP Procedures and Devices:  Cardiac event monitor (06/28/16): Predominantly sinus rhythm with rare PACs and PVCs, as well as paroxysmal SVT (lasting up to 10.8 seconds).  Non-Invasive Evaluation(s):  Pharmacologic MPI (09/29/16): Low risk study without ischemia or scar. Nonspecific ST depression noted with regadenoson. LVEF 55-65%.   Bilateral carotid Doppler (05/31/16): Mild bilateral carotid atherosclerosis (<50%).  Antegrade vertebral artery flow bilaterally.  TTE (05/29/16): Technically difficult study. Normal LV size and function (LVEF 60-65%). No significant valvular abnormalities (though suboptimally visualized). Normal RV size and function.  Recent CV Pertinent Labs: Lab Results  Component Value Date   CHOL 198 07/08/2018   HDL 46 07/08/2018   LDLCALC 78 07/08/2018   LDLDIRECT 197.3 02/01/2010   TRIG 371 (H) 07/08/2018   CHOLHDL 4.3 07/08/2018   CHOLHDL 5.0 09/29/2016   K 4.4 07/08/2018   K 3.8 12/04/2012   MG 2.2 02/06/2017   BUN 12 07/08/2018   BUN 16 12/04/2012   CREATININE 0.76 07/08/2018   CREATININE 0.64 12/04/2012    Past medical and surgical history were reviewed and updated in EPIC.  Current Meds  Medication Sig  . alendronate (FOSAMAX) 10 MG tablet Take by mouth.  . ARIPiprazole (ABILIFY) 2 MG tablet TAKE 1 TABLET(2 MG) BY MOUTH DAILY WITH BREAKFAST FOR MOOD OR SYMPTOMS  . aspirin EC 81 MG tablet Take 81 mg by mouth daily.  Marland Kitchen atorvastatin (LIPITOR) 40 MG tablet Take 40 mg by mouth daily at 6 (six) AM.   . cetirizine (ZYRTEC) 10 MG tablet Take by mouth.  . Cholecalciferol (VITAMIN D3) 5000 units TABS Take 5,000 Units by mouth daily.  Marland Kitchen dexlansoprazole (DEXILANT) 60 MG capsule Take 60 mg by mouth daily.   . hydrOXYzine (VISTARIL) 25 MG capsule TAKE 1 CAPSULE(25 MG) BY MOUTH THREE TIMES DAILY AS NEEDED FOR ANXIETY  . linaclotide (LINZESS) 72 MCG capsule Take by mouth.  . metoprolol succinate (TOPROL XL) 25 MG 24 hr tablet Take 1 tablet (25 mg total) by mouth daily.  . mirtazapine (REMERON) 15 MG tablet Take 1 tablet (15 mg total) by mouth at bedtime. FOR SLEEP  . Multiple Vitamin (MULTIVITAMIN) tablet Take 1 tablet by mouth  daily.  . potassium chloride SA (K-DUR) 20 MEQ tablet TAKE 2 TABLETS BY MOUTH DAILY  . ranitidine (ZANTAC) 300 MG tablet Take 300 mg by mouth at bedtime.   . triamcinolone cream (KENALOG) 0.1 % triamcinolone acetonide 0.1 % topical cream  . Vilazodone  HCl (VIIBRYD) 40 MG TABS TAKE 1 TABLET(40 MG) BY MOUTH DAILY    Allergies: Levofloxacin, Mobic [meloxicam], Propoxyphene hcl, Tramadol, and Codeine  Social History   Tobacco Use  . Smoking status: Former Smoker    Packs/day: 0.25    Years: 14.00    Pack years: 3.50    Types: Cigarettes    Quit date: 1992    Years since quitting: 28.6  . Smokeless tobacco: Former Systems developer    Quit date: 08/29/1990  Substance Use Topics  . Alcohol use: Yes    Comment: occasional; once every 6 months  . Drug use: No    Family History  Problem Relation Age of Onset  . Diabetes Mother   . COPD Mother   . Heart disease Mother   . Diabetes Father   . COPD Father   . Heart disease Father   . Heart attack Father 65       CABG  . Alcohol abuse Daughter   . Depression Daughter   . Drug abuse Daughter   . Anxiety disorder Daughter   . Tuberculosis Maternal Aunt   . Cancer Paternal Aunt        breast  . Breast cancer Paternal Aunt   . Diabetes Paternal Grandmother   . Diabetes Sister   . Cancer Cousin   . Diabetes Cousin   . Colon cancer Neg Hx   . Sudden Cardiac Death Neg Hx     Review of Systems: A 12-system review of systems was performed and was negative except as noted in the HPI.  --------------------------------------------------------------------------------------------------  Physical Exam: BP 100/80 (BP Location: Left Arm, Patient Position: Sitting, Cuff Size: Normal)   Pulse (!) 111   Ht 5\' 1"  (1.549 m)   Wt 151 lb 12 oz (68.8 kg)   LMP 09/30/1991 (Approximate) Comment: 1993  BMI 28.67 kg/m   General: NAD. HEENT: No conjunctival pallor or scleral icterus. Moist mucous membranes.  OP clear. Neck: Supple without lymphadenopathy, thyromegaly, JVD, or HJR. Lungs: Normal work of breathing. Clear to auscultation bilaterally without wheezes or crackles. Heart: Tachycardic but regular without murmurs, rubs, or gallops. Non-displaced PMI. Abd: Bowel sounds present. Soft, NT/ND  without hepatosplenomegaly Ext: No lower extremity edema. Radial, PT, and DP pulses are 2+ bilaterally. Skin: Warm and dry without rash.  EKG: Sinus tachycardia (heart rate 108 bpm) with nonspecific ST segment changes.  Heart rate has increased slightly since 07/08/2018.  Otherwise, no significant interval change.  Lab Results  Component Value Date   WBC 7.4 07/08/2018   HGB 13.7 07/08/2018   HCT 39.1 07/08/2018   MCV 94 07/08/2018   PLT 236 07/08/2018    Lab Results  Component Value Date   NA 145 (H) 07/08/2018   K 4.4 07/08/2018   CL 105 07/08/2018   CO2 23 07/08/2018   BUN 12 07/08/2018   CREATININE 0.76 07/08/2018   GLUCOSE 85 07/08/2018   ALT 24 07/08/2018    Lab Results  Component Value Date   CHOL 198 07/08/2018   HDL 46 07/08/2018   LDLCALC 78 07/08/2018   LDLDIRECT 197.3 02/01/2010   TRIG 371 (H) 07/08/2018   CHOLHDL 4.3 07/08/2018    --------------------------------------------------------------------------------------------------  ASSESSMENT AND PLAN:  Orthostatic lightheadedness: Symptoms well controlled at this time.  I encouraged Wanda [name] to continue stay well-hydrated.  No further work-up at this time.  Sinus tachycardia: Longstanding, somewhat improved since initiation of low-dose metoprolol.  I am reluctant to escalate this further given the low baseline blood pressure.  Given the lack of symptoms and extensive prior work-up, we will not pursue any further testing or interventions at this time.  Chest pain: Typically when anxious.  No exertional component.  EKG shows unchanged nonspecific ST changes.  Myocardial perfusion stress test in 09/2016 was low risk without ischemia or scar.  We will defer additional testing at this time unless symptoms worsen, in which case we may need to consider cardiac catheterization.  Given her tachycardia, I do not think that cardiac CTA would be a worthwhile endeavor.  We will continue low-dose metoprolol.  Escalation  or addition of another antianginal agent is somewhat limited by low blood pressure, though ranolazine could be considered as a next agent if necessary  Follow-up: Return to clinic in 6 months, sooner if chest pain worsens.  Wanda Bush, MD 01/09/2019 1:12 PM

## 2019-01-08 NOTE — Patient Instructions (Signed)
Medication Instructions:  Your physician recommends that you continue on your current medications as directed. Please refer to the Current Medication list given to you today.  If you need a refill on your cardiac medications before your next appointment, please call your pharmacy.   Lab work: - None ordered.  If you have labs (blood work) drawn today and your tests are completely normal, you will receive your results only by: . MyChart Message (if you have MyChart) OR . A paper copy in the mail If you have any lab test that is abnormal or we need to change your treatment, we will call you to review the results.  Testing/Procedures: - None ordered.   Follow-Up: At [facility], you and your health needs are our priority.  As part of our continuing mission to provide you with exceptional heart care, we have created designated Provider Care Teams.  These Care Teams include your primary Cardiologist (physician) and Advanced Practice Providers (APPs -  Physician Assistants and Nurse Practitioners) who all work together to provide you with the care you need, when you need it. You will need a follow up appointment in 6 months.  Please call our office 2 months in advance to schedule this appointment.  You may see DR CHRISTOPHER END or one of the following Advanced Practice Providers on your designated Care Team:   Christopher Berge, NP Ryan Dunn, PA-C . Jacquelyn Visser, PA-C    

## 2019-01-09 ENCOUNTER — Encounter: Payer: Self-pay | Admitting: Internal Medicine

## 2019-01-13 ENCOUNTER — Ambulatory Visit (INDEPENDENT_AMBULATORY_CARE_PROVIDER_SITE_OTHER): Payer: Medicare HMO | Admitting: Psychiatry

## 2019-01-13 ENCOUNTER — Encounter: Payer: Self-pay | Admitting: Psychiatry

## 2019-01-13 ENCOUNTER — Other Ambulatory Visit: Payer: Self-pay

## 2019-01-13 DIAGNOSIS — F331 Major depressive disorder, recurrent, moderate: Secondary | ICD-10-CM

## 2019-01-13 DIAGNOSIS — F4010 Social phobia, unspecified: Secondary | ICD-10-CM

## 2019-01-13 DIAGNOSIS — F411 Generalized anxiety disorder: Secondary | ICD-10-CM

## 2019-01-13 MED ORDER — VIIBRYD 40 MG PO TABS: ORAL_TABLET | ORAL | 3 refills | Status: DC

## 2019-01-13 NOTE — Progress Notes (Signed)
Virtual Visit via Telephone Note  I connected with Wanda [name] on 01/13/19 at  4:00 PM EDT by telephone and verified that I am speaking with the correct person using two identifiers.   I discussed the limitations, risks, security and privacy concerns of performing an evaluation and management service by telephone and the availability of in person appointments. I also discussed with the patient that there may be a patient responsible charge related to this service. The patient expressed understanding and agreed to proceed.    I discussed the assessment and treatment plan with the patient. The patient was provided an opportunity to ask questions and all were answered. The patient agreed with the plan and demonstrated an understanding of the instructions.   The patient was advised to call back or seek an in-person evaluation if the symptoms worsen or if the condition fails to improve as anticipated.   [place] MD OP Progress Note  01/13/2019 4:55 PM Wanda [name]  MRN:  [id number]  Chief Complaint:  Chief Complaint    Follow-up     HPI: Wanda [name] is a 63 year old Caucasian female, widowed, on SSD, lives in [place], has a history of depression, MDD, SVT, orthostatic hypotension, hyperlipidemia was evaluated by phone today.  Patient preferred to do a phone call.  Patient today reports she is currently doing well on the current medication regimen.  She does struggle with some sleep problems on and off.  She continues to take mirtazapine that helps at least 3 to 4 days a week.  She reports she used to be on melatonin previously which may have helped.  Discussed the starting melatonin.  She otherwise denies any other concerns.  She denies any suicidality, homicidality or perceptual disturbances.  Patient reports she continues to make sure she is following precautions.  She is trying to stay safe in the COVID-19.   Visit Diagnosis:    ICD-10-CM   1. GAD (generalized anxiety disorder)  F41.1  Vilazodone HCl (VIIBRYD) 40 MG TABS  2. MDD (major depressive disorder), recurrent episode, moderate (HCC)  F33.1 Vilazodone HCl (VIIBRYD) 40 MG TABS  3. Social anxiety disorder  F40.10     Past Psychiatric History: I have reviewed past psychiatric history from my progress note on 08/21/2017.  Past trials of Zoloft, Cymbalta, Effexor, Paxil, Prozac, Wellbutrin, Xanax, prazosin, Seroquel, trazodone, doxepin.  Past Medical History:  Past Medical History:  Diagnosis Date  . Anxiety   . Borderline diabetes   . BRONCHITIS, ACUTE WITH BRONCHOSPASM 06/25/2010   Qualifier: Diagnosis of  By: Alveta Heimlich MD, Cornelia Copa    . Chronic prescription benzodiazepine use 10/18/2015  . Depression   . GASTROENTERITIS WITHOUT DEHYDRATION 05/14/2009   Qualifier: Diagnosis of  By: Deborra Medina MD, Tanja Port    . GERD (gastroesophageal reflux disease)   . Herpes   . 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 Hyperlipidemia   . Irregular heart beat   . Sinus tachycardia 04/11/2017  . Skin cancer of face 08/01/2018  . 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 Dossie Arbour Serial number: [id number] Model number: [id number]     Past Surgical History:  Procedure Laterality Date  . ABDOMINAL HYSTERECTOMY    . APPENDECTOMY    . BACK SURGERY     X 3  . BREAST EXCISIONAL BIOPSY Right    surgical bx age 20   . COLONOSCOPY N/A 08/28/2013   Procedure: COLONOSCOPY;  Surgeon: Marga Melnick  Fields, MD;  Location: [facility];  Service: Endoscopy;  Laterality: N/A;  8:30 AM  . INTRAMEDULLARY (IM) NAIL INTERTROCHANTERIC Left 05/30/2016   Procedure: INTRAMEDULLARY (IM) NAIL INTERTROCHANTRIC;  Surgeon: Thornton Park, MD;  Location: [facility] ORS;  Service: Orthopedics;  Laterality: Left;  . SHOULDER SURGERY    . Spinal cord stimulator      Family Psychiatric History: I have reviewed family psychiatric history from my progress note on 08/21/2017.  Family  History:  Family History  Problem Relation Age of Onset  . Diabetes Mother   . COPD Mother   . Heart disease Mother   . Diabetes Father   . COPD Father   . Heart disease Father   . Heart attack Father 90       CABG  . Alcohol abuse Daughter   . Depression Daughter   . Drug abuse Daughter   . Anxiety disorder Daughter   . Tuberculosis Maternal Aunt   . Cancer Paternal Aunt        breast  . Breast cancer Paternal Aunt   . Diabetes Paternal Grandmother   . Diabetes Sister   . Cancer Cousin   . Diabetes Cousin   . Colon cancer Neg Hx   . Sudden Cardiac Death Neg Hx     Social History: I have reviewed social history from my progress note on 08/21/2017. Social History   Socioeconomic History  . Marital status: Divorced    Spouse name: Not on file  . Number of children: 2  . Years of education: Not on file  . Highest education level: High school graduate  Occupational History    Comment: disabled  Social Needs  . Financial resource strain: Somewhat hard  . Food insecurity    Worry: Never true    Inability: Never true  . Transportation needs    Medical: No    Non-medical: No  Tobacco Use  . Smoking status: Former Smoker    Packs/day: 0.25    Years: 14.00    Pack years: 3.50    Types: Cigarettes    Quit date: 1992    Years since quitting: 28.6  . Smokeless tobacco: Former Systems developer    Quit date: 08/29/1990  Substance and Sexual Activity  . Alcohol use: Yes    Comment: occasional; once every 6 months  . Drug use: No  . Sexual activity: Yes    Birth control/protection: Surgical  Lifestyle  . Physical activity    Days per week: 0 days    Minutes per session: 0 min  . Stress: Very much  Relationships  . Social Herbalist on phone: Twice a week    Gets together: Never    Attends religious service: More than 4 times per year    Active member of club or organization: Yes    Attends meetings of clubs or organizations: Never    Relationship status: Divorced   Other Topics Concern  . Not on file  Social History Narrative  . Not on file    Allergies:  Allergies  Allergen Reactions  . Levofloxacin     REACTION: N \\T \ V  . Mobic [Meloxicam] Other (See Comments)    Calf tightness  . Propoxyphene Hcl     REACTION: N \\T \ V  . Tramadol Other (See Comments)    Severe headache  . Codeine Nausea Only    Slight nausea    Metabolic Disorder Labs: Lab Results  Component Value Date   HGBA1C  5.4 05/29/2016   MPG 108 05/29/2016   No results found for: PROLACTIN Lab Results  Component Value Date   CHOL 198 07/08/2018   TRIG 371 (H) 07/08/2018   HDL 46 07/08/2018   CHOLHDL 4.3 07/08/2018   VLDL 59 (H) 09/29/2016   LDLCALC 78 07/08/2018   LDLCALC 106 (H) 09/29/2016   Lab Results  Component Value Date   TSH 2.560 07/08/2018   TSH 2.050 10/18/2016    Therapeutic Level Labs: No results found for: LITHIUM No results found for: VALPROATE No components found for:  CBMZ  Current Medications: Current Outpatient Medications  Medication Sig Dispense Refill  . alendronate (FOSAMAX) 10 MG tablet Take by mouth.    . ARIPiprazole (ABILIFY) 2 MG tablet TAKE 1 TABLET(2 MG) BY MOUTH DAILY WITH BREAKFAST FOR MOOD OR SYMPTOMS 30 tablet 3  . aspirin EC 81 MG tablet Take 81 mg by mouth daily.    Marland Kitchen atorvastatin (LIPITOR) 40 MG tablet Take 40 mg by mouth daily at 6 (six) AM.     . cetirizine (ZYRTEC) 10 MG tablet Take by mouth.    . Cholecalciferol (VITAMIN D3) 5000 units TABS Take 5,000 Units by mouth daily.    Marland Kitchen dexlansoprazole (DEXILANT) 60 MG capsule Take 60 mg by mouth daily.     . hydrOXYzine (VISTARIL) 25 MG capsule TAKE 1 CAPSULE(25 MG) BY MOUTH THREE TIMES DAILY AS NEEDED FOR ANXIETY 90 capsule 3  . linaclotide (LINZESS) 72 MCG capsule Take by mouth.    . metoprolol succinate (TOPROL XL) 25 MG 24 hr tablet Take 1 tablet (25 mg total) by mouth daily. 90 tablet 3  . mirtazapine (REMERON) 15 MG tablet Take 1 tablet (15 mg total) by mouth at  bedtime. FOR SLEEP 30 tablet 2  . Multiple Vitamin (MULTIVITAMIN) tablet Take 1 tablet by mouth daily.    . potassium chloride SA (K-DUR) 20 MEQ tablet TAKE 2 TABLETS BY MOUTH DAILY 60 tablet 1  . ranitidine (ZANTAC) 300 MG tablet Take 300 mg by mouth at bedtime.     . triamcinolone cream (KENALOG) 0.1 % triamcinolone acetonide 0.1 % topical cream    . Vilazodone HCl (VIIBRYD) 40 MG TABS TAKE 1 TABLET(40 MG) BY MOUTH DAILY 30 tablet 3   No current facility-administered medications for this visit.      Musculoskeletal: Strength & Muscle Tone: UTA Gait & Station: Reports as WNL Patient leans: N/A  Psychiatric Specialty Exam: Review of Systems  Psychiatric/Behavioral: The patient is not nervous/anxious.   All other systems reviewed and are negative.   Last menstrual period 09/30/1991.There is no height or weight on file to calculate BMI.  General Appearance: UTA  Eye Contact:  UTA  Speech:  Clear and Coherent  Volume:  Normal  Mood:  Euthymic  Affect:  UTA  Thought Process:  Goal Directed and Descriptions of Associations: Intact  Orientation:  Full (Time, Place, and Person)  Thought Content: Logical   Suicidal Thoughts:  No  Homicidal Thoughts:  No  Memory:  Immediate;   Fair Recent;   Fair Remote;   Fair  Judgement:  Fair  Insight:  Fair  Psychomotor Activity:  Normal  Concentration:  Concentration: Fair and Attention Span: Fair  Recall:  [employer] of Knowledge: Fair  Language: Fair  Akathisia:  No  Handed:  Right  AIMS (if indicated): denies tremors, rigidity  Assets:  Communication Skills Desire for Improvement Housing Social Support  ADL's:  Intact  Cognition: WNL  Sleep:  Restless   Screenings: PHQ2-9     Procedure visit from 07/24/2017 in [facility] [facility] Visit from 07/16/2017 in [facility] Procedure visit from 06/28/2017 in [facility] [facility] Visit from 03/21/2017 in [facility] Procedure visit from 02/20/2017 in [facility]  PHQ-2 Total Score  0  0  0  0  0       Assessment and Plan: Jourdyn is a 63 year old Caucasian female, widowed, lives in [place], has a history of depression, anxiety, chronic pain, insomnia, SVT was evaluated by telemedicine today.  Patient is currently doing well on the current medication regimen except for sleep which is restless.  We will continue to make medication readjustment.  Plan For depression-stable Viibryd 40 mg p.o. daily Abilify 2 mg p.o. daily  For anxiety disorder-stable Viibryd as prescribed Hydroxyzine 25 mg p.o. 3 times daily PRN  For insomnia- restless  mirtazapine 15 mg p.o. nightly Discussed with patient to restart melatonin 3 to 5 mg p.o. nightly.  Follow-up in clinic in 2 to 3 months or sooner if needed.  November 11 at 2 PM  I have spent atleast 15 minutes non face to face with patient today. More than 50 % of the time was spent for psychoeducation and supportive psychotherapy and care coordination.  This note was generated in part or whole with voice recognition software. Voice recognition is usually quite accurate but there are transcription errors that can and very often do occur. I apologize for any typographical errors that were not detected and corrected.       Ursula Alert, MD 01/13/2019, 4:55 PM

## 2019-01-14 DIAGNOSIS — F4310 Post-traumatic stress disorder, unspecified: Secondary | ICD-10-CM | POA: Insufficient documentation

## 2019-01-16 ENCOUNTER — Ambulatory Visit (INDEPENDENT_AMBULATORY_CARE_PROVIDER_SITE_OTHER): Payer: Medicare HMO | Admitting: Licensed Clinical Social Worker

## 2019-01-16 ENCOUNTER — Encounter: Payer: Self-pay | Admitting: Licensed Clinical Social Worker

## 2019-01-16 ENCOUNTER — Other Ambulatory Visit: Payer: Self-pay

## 2019-01-16 DIAGNOSIS — F331 Major depressive disorder, recurrent, moderate: Secondary | ICD-10-CM | POA: Diagnosis not present

## 2019-01-16 DIAGNOSIS — F411 Generalized anxiety disorder: Secondary | ICD-10-CM | POA: Diagnosis not present
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
## 2019-01-22 ENCOUNTER — Other Ambulatory Visit: Payer: Self-pay | Admitting: Psychiatry

## 2019-01-22 DIAGNOSIS — F331 Major depressive disorder, recurrent, moderate: Secondary | ICD-10-CM

## 2019-01-22 DIAGNOSIS — F411 Generalized anxiety disorder: Secondary | ICD-10-CM

## 2019-02-13 ENCOUNTER — Other Ambulatory Visit: Payer: Self-pay

## 2019-02-13 ENCOUNTER — Ambulatory Visit
Admission: RE | Admit: 2019-02-13 | Discharge: 2019-02-13 | Disposition: A | Discharge status: Home / Self Care | Payer: Medicare HMO | Source: Ambulatory Visit | Attending: Pediatrics | Admitting: Pediatrics

## 2019-02-13 DIAGNOSIS — Z1231 Encounter for screening mammogram for malignant neoplasm of breast: Secondary | ICD-10-CM

## 2019-02-13 DIAGNOSIS — M810 Age-related osteoporosis without current pathological fracture: Secondary | ICD-10-CM

## 2019-02-20 ENCOUNTER — Other Ambulatory Visit: Payer: Self-pay | Admitting: Internal Medicine

## 2019-02-21 ENCOUNTER — Other Ambulatory Visit: Payer: Self-pay | Admitting: Internal Medicine

## 2019-02-26 ENCOUNTER — Other Ambulatory Visit: Payer: Self-pay

## 2019-02-26 ENCOUNTER — Encounter: Payer: Self-pay | Admitting: Licensed Clinical Social Worker

## 2019-02-26 ENCOUNTER — Ambulatory Visit (INDEPENDENT_AMBULATORY_CARE_PROVIDER_SITE_OTHER): Payer: Medicare HMO | Admitting: Licensed Clinical Social Worker

## 2019-02-26 DIAGNOSIS — F331 Major depressive disorder, recurrent, moderate: Secondary | ICD-10-CM | POA: Diagnosis not present

## 2019-02-26 DIAGNOSIS — F411 Generalized anxiety disorder: Secondary | ICD-10-CM | POA: Diagnosis not present

## 2019-02-26 NOTE — Progress Notes (Signed)
Virtual Visit via Telephone Note  I connected with [name] on 02/26/19 at 12:30 PM EDT by telephone and verified that I am speaking with the correct person using two identifiers.   I discussed the limitations, risks, security and privacy concerns of performing an evaluation and management service by telephone and the availability of in person appointments. I also discussed with the patient that there may be a patient responsible charge related to this service. The patient expressed understanding and agreed to proceed.  I discussed the assessment and treatment plan with the patient. The patient was provided an opportunity to ask questions and all were answered. The patient agreed with the plan and demonstrated an understanding of the instructions.   The patient was advised to call back or seek an in-person evaluation if the symptoms worsen or if the condition fails to improve as anticipated.  I provided 50 minutes of non-face-to-face time during this encounter.   Alden Hipp, LCSW    THERAPIST PROGRESS NOTE  Session Time: 1230  Participation Level: Active  Behavioral Response: NeatAlertDepressed  Type of Therapy: Individual Therapy  Treatment Goals addressed: Coping  Interventions: Supportive  Summary: ILEANNA GIANNI is a 63 y.o. female who presents with continued symptoms related to her diagnosis. Kynlei reports doing well since our last session, but stated she has had a difficult time since her niece passed away. She reported her niece had leukemia and passed away last month. She stated, "I'm not sure why I'm taking this so hard." LCSW validated Makaylia's feelings, and encouraged her to allow herself to feel however she's feeling while going through the grieving process. We discussed the process of grief, and how it looks differently for everyone and can change at any time. LCSW asked Yahayra if there were other elements contributing to her grief at this time. She reports she and  her sister have been discussing "what if's" about her niece. LCSW validated those feelings and normalized this process--but encouraged Jasvinder to recognize the "what ifs" will not change the outcome, and to move through the grieving process, she will need to accept the outcome. Keyonce was able to understand this and expressed agreement. She stated this has brought up a lot for her within her familial relationships. "I've tried to get closer with one of my sisters, but she just pushes me away every time." LCSW validated her feelings, and asked Terren what she could to do ensure she has no regrets when all is said and done. Shamya reported she would like to make more effort to be closer with her family members. LCSW encouraged Alethea to do that without fear of being rejected--because if she is rejected, at least she tried. Adaleia expressed understanding and agreement with this information as well.   Suicidal/Homicidal: No  Therapist Response: Hillory continues to work towards her tx goals but has not yet reached them. We will continue to work on emotional regulation skills and improving communication moving forward.   Plan: Return again in 4 weeks.  Diagnosis: Axis I: Generalized Anxiety Disorder    Axis II: No diagnosis    Alden Hipp, LCSW 02/26/2019

## 2019-03-26 ENCOUNTER — Other Ambulatory Visit: Payer: Self-pay

## 2019-03-26 ENCOUNTER — Encounter: Payer: Self-pay | Admitting: Licensed Clinical Social Worker

## 2019-03-26 ENCOUNTER — Ambulatory Visit (INDEPENDENT_AMBULATORY_CARE_PROVIDER_SITE_OTHER): Payer: Medicare HMO | Admitting: Licensed Clinical Social Worker

## 2019-03-26 DIAGNOSIS — F331 Major depressive disorder, recurrent, moderate: Secondary | ICD-10-CM

## 2019-03-26 DIAGNOSIS — F411 Generalized anxiety disorder: Secondary | ICD-10-CM | POA: Diagnosis not present

## 2019-03-26 NOTE — Progress Notes (Signed)
Virtual Visit via Video Note  I connected with Wanda [name] on 03/26/19 at 12:30 PM EDT by a video enabled telemedicine application and verified that I am speaking with the correct person using two identifiers.   I discussed the limitations of evaluation and management by telemedicine and the availability of in person appointments. The patient expressed understanding and agreed to proceed.  I discussed the assessment and treatment plan with the patient. The patient was provided an opportunity to ask questions and all were answered. The patient agreed with the plan and demonstrated an understanding of the instructions.   The patient was advised to call back or seek an in-person evaluation if the symptoms worsen or if the condition fails to improve as anticipated.  I provided 30 minutes of non-face-to-face time during this encounter.   Alden Hipp, LCSW    THERAPIST PROGRESS NOTE  Session Time: 1230  Participation Level: Active  Behavioral Response: CasualAlertAnxious  Type of Therapy: Individual Therapy  Treatment Goals addressed: Coping  Interventions: Supportive  Summary: Wanda [name] is a 63 y.o. female who presents with continued symptoms related to her diagnosis. Wanda [name] reports doing well since our last session. She stated, "I've learned to understand my mood swings a little bit better, and understand that I have to distract myself and know that it will pass." LCSW validated this statement and highlighted how insightful it is to know what you need in a moment. Wanda [name] reported she has been feeling anxious regarding the upcoming election and the information she sees on television. We discussed ways to manage anxiety on that topic, and how she could best utilize distraction to keep herself occupied when feeling anxious. We also discussed ways to challenge negative thoughts in order to decrease anxiety symptoms. Wanda [name] reported overall she has been doing well, and has been able to  stay safe and calm in the moment. She reports she continues to have a good relationship with her daughter, and is happy they have mended fences.   Suicidal/Homicidal: No  Therapist Response: Wanda [name] continues to work towards her tx goals but has not yet reached them. We will continue to work on emotional regulation and improving communication moving forward.   Plan: Return again in 4 weeks.  Diagnosis: Axis I: Generalized Anxiety Disorder    Axis II: No diagnosis    Alden Hipp, LCSW 03/26/2019

## 2019-04-09 ENCOUNTER — Other Ambulatory Visit: Payer: Self-pay

## 2019-04-09 ENCOUNTER — Ambulatory Visit (INDEPENDENT_AMBULATORY_CARE_PROVIDER_SITE_OTHER): Payer: Medicare HMO | Admitting: Psychiatry

## 2019-04-09 ENCOUNTER — Encounter: Payer: Self-pay | Admitting: Psychiatry

## 2019-04-09 DIAGNOSIS — G2402 Drug induced acute dystonia: Secondary | ICD-10-CM

## 2019-04-09 DIAGNOSIS — F3342 Major depressive disorder, recurrent, in full remission: Secondary | ICD-10-CM | POA: Diagnosis not present

## 2019-04-09 DIAGNOSIS — F411 Generalized anxiety disorder: Secondary | ICD-10-CM

## 2019-04-09 DIAGNOSIS — F4010 Social phobia, unspecified: Secondary | ICD-10-CM

## 2019-04-09 DIAGNOSIS — T43505A Adverse effect of unspecified antipsychotics and neuroleptics, initial encounter: Secondary | ICD-10-CM

## 2019-04-09 MED ORDER — MIRTAZAPINE 15 MG PO TABS: 15.0000 mg | ORAL_TABLET | Freq: Every day | ORAL | 3 refills | Status: DC

## 2019-04-09 MED ORDER — VIIBRYD 40 MG PO TABS: ORAL_TABLET | ORAL | 3 refills | Status: DC

## 2019-04-09 MED ORDER — ARIPIPRAZOLE 2 MG PO TABS: 1.0000 mg | ORAL_TABLET | Freq: Every day | ORAL | 1 refills | Status: DC

## 2019-04-09 NOTE — Progress Notes (Signed)
Virtual Visit via Telephone Note  I connected with Wanda [name] on 04/09/19 at  2:00 PM EST by telephone and verified that I am speaking with the correct person using two identifiers.   I discussed the limitations, risks, security and privacy concerns of performing an evaluation and management service by telephone and the availability of in person appointments. I also discussed with the patient that there may be a patient responsible charge related to this service. The patient expressed understanding and agreed to proceed.      I discussed the assessment and treatment plan with the patient. The patient was provided an opportunity to ask questions and all were answered. The patient agreed with the plan and demonstrated an understanding of the instructions.   The patient was advised to call back or seek an in-person evaluation if the symptoms worsen or if the condition fails to improve as anticipated.  Russell MD OP Progress Note  04/09/2019 2:07 PM Wanda [name]  MRN:  [id number]  Chief Complaint:  Chief Complaint    Follow-up     HPI: Zulma is a 63 year old Caucasian female, widowed, on SSD, lives in [place], has a history of depression, MDD, SVT, orthostatic hypotension, hyperlipidemia was evaluated by phone today.  Patient preferred to do a phone call.  Patient today reports she is currently stable on the current medication regimen.  She denies any significant anxiety or depressive symptoms.  She reports sleep is good.  Patient denies any suicidality, homicidality or perceptual disturbances.  Patient reports she got a puppy recently, 52-week-old.  She reports it keeps her busy and has been very therapeutic for her.  Patient does report side effects to Abilify-movement problems of her jaw..  She reports it happens usually at night and does not happen daily.  She reports she applies pressure to it and it goes away after a while.  Discussed readjusting the dosage of Abilify to see if  that will help.  Patient denies any other concerns today. Visit Diagnosis:    ICD-10-CM   1. GAD (generalized anxiety disorder)  F41.1 mirtazapine (REMERON) 15 MG tablet    Vilazodone HCl (VIIBRYD) 40 MG TABS   stable  2. MDD (major depressive disorder), recurrent, in full remission ([place])  F33.42 ARIPiprazole (ABILIFY) 2 MG tablet    mirtazapine (REMERON) 15 MG tablet    Vilazodone HCl (VIIBRYD) 40 MG TABS  3. Social anxiety disorder  F40.10   4. 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  Past trials of Zoloft, Cymbalta, Effexor, Paxil, Prozac, Wellbutrin, Xanax, prazosin, Seroquel, trazodone, doxepin  Past Medical History:  Past Medical History:  Diagnosis Date  . Anxiety   . Borderline diabetes   . BRONCHITIS, ACUTE WITH BRONCHOSPASM 06/25/2010   Qualifier: Diagnosis of  By: Alveta Heimlich MD, Cornelia Copa    . Chronic prescription benzodiazepine use 10/18/2015  . Depression   . GASTROENTERITIS WITHOUT DEHYDRATION 05/14/2009   Qualifier: Diagnosis of  By: Deborra Medina MD, Tanja Port    . GERD (gastroesophageal reflux disease)   . Herpes   . 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 Hyperlipidemia   . Irregular heart beat   . Sinus tachycardia 04/11/2017  . Skin cancer of face 08/01/2018  . 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 Dossie Arbour Serial number: [id number] H Model  number: [id number]     Past Surgical History:  Procedure Laterality Date  . ABDOMINAL HYSTERECTOMY    . APPENDECTOMY    . BACK SURGERY     X 3  . BREAST EXCISIONAL BIOPSY Right    surgical bx age 11   . COLONOSCOPY N/A 08/28/2013   Procedure: COLONOSCOPY;  Surgeon: Danie Binder, MD;  Location: [facility];  Service: Endoscopy;  Laterality: N/A;  8:30 AM  . INTRAMEDULLARY (IM) NAIL INTERTROCHANTERIC Left 05/30/2016    Procedure: INTRAMEDULLARY (IM) NAIL INTERTROCHANTRIC;  Surgeon: Thornton Park, MD;  Location: [facility] ORS;  Service: Orthopedics;  Laterality: Left;  . SHOULDER SURGERY    . Spinal cord stimulator      Family Psychiatric History: I have reviewed family psychiatric history from my progress note on 08/21/2017  Family History:  Family History  Problem Relation Age of Onset  . Diabetes Mother   . COPD Mother   . Heart disease Mother   . Diabetes Father   . COPD Father   . Heart disease Father   . Heart attack Father 16       CABG  . Alcohol abuse Daughter   . Depression Daughter   . Drug abuse Daughter   . Anxiety disorder Daughter   . Tuberculosis Maternal Aunt   . Cancer Paternal Aunt        breast  . Breast cancer Paternal Aunt   . Diabetes Paternal Grandmother   . Diabetes Sister   . Cancer Cousin   . Diabetes Cousin   . Colon cancer Neg Hx   . Sudden Cardiac Death Neg Hx     Social History: Reviewed social history from my progress note on 08/21/2017 Social History   Socioeconomic History  . Marital status: Divorced    Spouse name: Not on file  . Number of children: 2  . Years of education: Not on file  . Highest education level: High school graduate  Occupational History    Comment: disabled  Social Needs  . Financial resource strain: Somewhat hard  . Food insecurity    Worry: Never true    Inability: Never true  . Transportation needs    Medical: No    Non-medical: No  Tobacco Use  . Smoking status: Former Smoker    Packs/day: 0.25    Years: 14.00    Pack years: 3.50    Types: Cigarettes    Quit date: 1992    Years since quitting: 28.8  . Smokeless tobacco: Former Systems developer    Quit date: 08/29/1990  Substance and Sexual Activity  . Alcohol use: Yes    Comment: occasional; once every 6 months  . Drug use: No  . Sexual activity: Yes    Birth control/protection: Surgical  Lifestyle  . Physical activity    Days per week: 0 days    Minutes per session: 0  min  . Stress: Very much  Relationships  . Social Herbalist on phone: Twice a week    Gets together: Never    Attends religious service: More than 4 times per year    Active member of club or organization: Yes    Attends meetings of clubs or organizations: Never    Relationship status: Divorced  Other Topics Concern  . Not on file  Social History Narrative  . Not on file    Allergies:  Allergies  Allergen Reactions  . Levofloxacin Hives    REACTION: N \\T \ V  .  Mobic [Meloxicam] Other (See Comments)    Calf tightness  . Propoxyphene Hcl     REACTION: N \\T \ V  . Tramadol Other (See Comments)    Severe headache  . Codeine Nausea Only    Slight nausea    Metabolic Disorder Labs: Lab Results  Component Value Date   HGBA1C 5.4 05/29/2016   MPG 108 05/29/2016   No results found for: PROLACTIN Lab Results  Component Value Date   CHOL 198 07/08/2018   TRIG 371 (H) 07/08/2018   HDL 46 07/08/2018   CHOLHDL 4.3 07/08/2018   VLDL 59 (H) 09/29/2016   LDLCALC 78 07/08/2018   LDLCALC 106 (H) 09/29/2016   Lab Results  Component Value Date   TSH 2.560 07/08/2018   TSH 2.050 10/18/2016    Therapeutic Level Labs: No results found for: LITHIUM No results found for: VALPROATE No components found for:  CBMZ  Current Medications: Current Outpatient Medications  Medication Sig Dispense Refill  . alendronate (FOSAMAX) 10 MG tablet Take by mouth.    . ARIPiprazole (ABILIFY) 2 MG tablet Take 0.5 tablets (1 mg total) by mouth daily. 15 tablet 1  . aspirin EC 81 MG tablet Take 81 mg by mouth daily.    Marland Kitchen atorvastatin (LIPITOR) 40 MG tablet Take 40 mg by mouth daily at 6 (six) AM.     . cetirizine (ZYRTEC) 10 MG tablet Take by mouth.    . Cholecalciferol (VITAMIN D3) 5000 units TABS Take 5,000 Units by mouth daily.    Marland Kitchen dexlansoprazole (DEXILANT) 60 MG capsule Take 60 mg by mouth daily.     . famotidine (PEPCID) 40 MG tablet famotidine 40 mg tablet    . famotidine  (PEPCID) 40 MG tablet     . hydrOXYzine (VISTARIL) 25 MG capsule TAKE 1 CAPSULE(25 MG) BY MOUTH THREE TIMES DAILY AS NEEDED FOR ANXIETY 90 capsule 3  . influenza vaccine (FLUCELVAX QUADRIVALENT) 0.5 ML injection Flucelvax Quad 2020-2021 (PF) 60 mcg (15 mcg x 4)/0.5 mL IM syringe    . linaclotide (LINZESS) 72 MCG capsule Take by mouth.    . metoprolol succinate (TOPROL-XL) 25 MG 24 hr tablet TAKE 1 TABLET(25 MG) BY MOUTH DAILY 90 tablet 2  . mirtazapine (REMERON) 15 MG tablet Take 1 tablet (15 mg total) by mouth at bedtime. FOR SLEEP 30 tablet 3  . Multiple Vitamin (MULTIVITAMIN) tablet Take 1 tablet by mouth daily.    . potassium chloride SA (K-DUR) 20 MEQ tablet TAKE 2 TABLETS BY MOUTH DAILY 60 tablet 3  . ranitidine (ZANTAC) 300 MG tablet Take 300 mg by mouth at bedtime.     . triamcinolone cream (KENALOG) 0.1 % triamcinolone acetonide 0.1 % topical cream    . Vilazodone HCl (VIIBRYD) 40 MG TABS DAILY 30 tablet 3   No current facility-administered medications for this visit.      Musculoskeletal: Strength & Muscle Tone: UTA Gait & Station: Reports as WNL Patient leans: N/A  Psychiatric Specialty Exam: Review of Systems  Psychiatric/Behavioral: Negative for depression, hallucinations, substance abuse and suicidal ideas. The patient is not nervous/anxious.   All other systems reviewed and are negative.   Last menstrual period 09/30/1991.There is no height or weight on file to calculate BMI.  General Appearance: UTA  Eye Contact:  UTA  Speech:  Clear and Coherent  Volume:  Normal  Mood:  Euthymic  Affect:  UTA  Thought Process:  Goal Directed and Descriptions of Associations: Intact  Orientation:  Full (Time, Place, and  Person)  Thought Content: Logical   Suicidal Thoughts:  No  Homicidal Thoughts:  No  Memory:  Immediate;   Fair Recent;   Fair Remote;   Fair  Judgement:  Fair  Insight:  Fair  Psychomotor Activity:  UTA  Concentration:  Concentration: Fair and Attention  Span: Fair  Recall:  [employer] of Knowledge: Fair  Language: Fair  Akathisia:  No  Handed:  Right  AIMS (if indicated): movements of jaw at night - on and off   Assets:  Communication Skills Desire for Improvement Housing Social Support  ADL's:  Intact  Cognition: WNL  Sleep:  Fair   Screenings: PHQ2-9     Procedure visit from 07/24/2017 in [facility] Office Visit from 07/16/2017 in [facility] Procedure visit from 06/28/2017 in [facility] Office Visit from 03/21/2017 in [facility] Procedure visit from 02/20/2017 in [facility]  PHQ-2 Total Score  0  0  0  0  0       Assessment and Plan: Hyacinth is a 63 year old Caucasian female, widowed, lives in [place], has a history of depression, anxiety, chronic pain, insomnia, SVT was evaluated by phone today.  Patient is stable on current medication regimen however does have side effects to Abilify.  Plan as noted below.  Plan For depression-in remission Viibryd 40 mg p.o. daily Reduce Abilify to 1 mg p.o. daily.  For anxiety disorder-stable Viibryd as prescribed Hydroxyzine 25 mg p.o. 3 times daily as needed  Insomnia-stable Mirtazapine 15 mg p.o. nightly melatonin as needed  Neuroleptic induced movement disorder-unstable We will reduce Abilify to 1 mg p.o. daily Patient to monitor her symptoms closely.  She will reach out with concerns.  Follow-up in clinic in 4 to 6 weeks or sooner if needed.  I have spent atleast 15 minutes non  face to face with patient today. More than 50 % of the time was spent for psychoeducation and supportive psychotherapy and care coordination. This note was generated in part or whole with voice recognition software. Voice recognition is usually quite accurate but there are transcription  errors that can and very often do occur. I apologize for any typographical errors that were not detected and corrected.       Ursula Alert, MD 04/09/2019, 2:07 PM

## 2019-04-29 ENCOUNTER — Encounter: Payer: Self-pay | Admitting: Licensed Clinical Social Worker

## 2019-04-29 ENCOUNTER — Ambulatory Visit (INDEPENDENT_AMBULATORY_CARE_PROVIDER_SITE_OTHER): Payer: Medicare HMO | Admitting: Licensed Clinical Social Worker

## 2019-04-29 ENCOUNTER — Other Ambulatory Visit: Payer: Self-pay

## 2019-04-29 DIAGNOSIS — F411 Generalized anxiety disorder: Secondary | ICD-10-CM | POA: Diagnosis not present
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
## 2019-05-26 ENCOUNTER — Encounter: Payer: Self-pay | Admitting: Psychiatry

## 2019-05-26 ENCOUNTER — Ambulatory Visit (INDEPENDENT_AMBULATORY_CARE_PROVIDER_SITE_OTHER): Payer: Medicare HMO | Admitting: Psychiatry

## 2019-05-26 ENCOUNTER — Other Ambulatory Visit: Payer: Self-pay

## 2019-05-26 DIAGNOSIS — F411 Generalized anxiety disorder: Secondary | ICD-10-CM

## 2019-05-26 DIAGNOSIS — F3342 Major depressive disorder, recurrent, in full remission: Secondary | ICD-10-CM | POA: Diagnosis not present

## 2019-05-26 DIAGNOSIS — F4010 Social phobia, unspecified: Secondary | ICD-10-CM | POA: Diagnosis not present

## 2019-05-26 MED ORDER — ARIPIPRAZOLE 2 MG PO TABS: 1.0000 mg | ORAL_TABLET | Freq: Every day | ORAL | 1 refills | Status: DC

## 2019-05-26 NOTE — Progress Notes (Signed)
Virtual Visit via Telephone Note  I connected with Wanda [name] on 05/26/19 at  4:15 PM EST by telephone and verified that I am speaking with the correct person using two identifiers.   I discussed the limitations, risks, security and privacy concerns of performing an evaluation and management service by telephone and the availability of in person appointments. I also discussed with the patient that there may be a patient responsible charge related to this service. The patient expressed understanding and agreed to proceed.      I discussed the assessment and treatment plan with the patient. The patient was provided an opportunity to ask questions and all were answered. The patient agreed with the plan and demonstrated an understanding of the instructions.   The patient was advised to call back or seek an in-person evaluation if the symptoms worsen or if the condition fails to improve as anticipated.   [place] MD OP Progress Note  05/26/2019 5:21 PM DENAYSHA KONING  MRN:  [id number]  Chief Complaint:  Chief Complaint    Follow-up     HPI: Wanda [name] is a 63 year old Caucasian female, widowed, on SSD, lives in [place], has a history of GAD, MDD, SVT, orthostatic hypotension, hyperlipidemia was evaluated by phone today.  Patient preferred to do a phone call.  Patient today reports she is currently having anxiety due to her current situational stressors.  She reports her grandson overdosed recently accidentally.  He is a drug abuser.  Patient reports he is not willing to get any help and her daughter is trying to do what she can to help him.  Patient reports that does make her anxious.  Patient denies any significant depressive symptoms other than having sleep issues.  She reports she has been having these vivid dreams at night which affects her sleep.  She reports she has no difficulty falling asleep but she wakes up in the middle of the night and has difficulty falling back asleep.  Her sleep is  hence interrupted.  Patient reports the mirtazapine does help her to fall asleep.  Patient denies any suicidality, homicidality or perceptual disturbances.  Patient continues to work with her therapist. Patient denies any other concerns today. Visit Diagnosis:    ICD-10-CM   1. GAD (generalized anxiety disorder)  F41.1   2. MDD (major depressive disorder), recurrent, in full remission ([place])  F33.42 ARIPiprazole (ABILIFY) 2 MG tablet  3. Social anxiety disorder  F40.10     Past Psychiatric History: I have reviewed past psychiatric history from my progress note on 08/21/2017.  Past trials of Zoloft, Cymbalta, Effexor, Paxil, Prozac, Wellbutrin, Xanax, prazosin, Seroquel, trazodone, doxepin.  Past Medical History:  Past Medical History:  Diagnosis Date  . Anxiety   . Borderline diabetes   . BRONCHITIS, ACUTE WITH BRONCHOSPASM 06/25/2010   Qualifier: Diagnosis of  By: Alveta Heimlich MD, Cornelia Copa    . Chronic prescription benzodiazepine use 10/18/2015  . Depression   . GASTROENTERITIS WITHOUT DEHYDRATION 05/14/2009   Qualifier: Diagnosis of  By: Deborra Medina MD, Tanja Port    . GERD (gastroesophageal reflux disease)   . Herpes   . 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 Hyperlipidemia   . Irregular heart beat   . Sinus tachycardia 04/11/2017  . Skin cancer of face 08/01/2018  . 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 Dossie Arbour Serial number: [id number] Model number: [id number]     Past Surgical History:  Procedure Laterality Date  . ABDOMINAL HYSTERECTOMY    . APPENDECTOMY    . BACK SURGERY     X 3  . BREAST EXCISIONAL BIOPSY Right    surgical bx age 59   . COLONOSCOPY N/A 08/28/2013   Procedure: COLONOSCOPY;  Surgeon: Danie Binder, MD;  Location: [facility];  Service: Endoscopy;  Laterality: N/A;  8:30 AM  . INTRAMEDULLARY (IM) NAIL INTERTROCHANTERIC Left 05/30/2016   Procedure: INTRAMEDULLARY (IM)  NAIL INTERTROCHANTRIC;  Surgeon: Thornton Park, MD;  Location: [facility] ORS;  Service: Orthopedics;  Laterality: Left;  . SHOULDER SURGERY    . Spinal cord stimulator      Family Psychiatric History: I have reviewed family psychiatric history from my progress note on 08/21/2017.  Family History:  Family History  Problem Relation Age of Onset  . Diabetes Mother   . COPD Mother   . Heart disease Mother   . Diabetes Father   . COPD Father   . Heart disease Father   . Heart attack Father 5       CABG  . Alcohol abuse Daughter   . Depression Daughter   . Drug abuse Daughter   . Anxiety disorder Daughter   . Tuberculosis Maternal Aunt   . Cancer Paternal Aunt        breast  . Breast cancer Paternal Aunt   . Diabetes Paternal Grandmother   . Diabetes Sister   . Cancer Cousin   . Diabetes Cousin   . Colon cancer Neg Hx   . Sudden Cardiac Death Neg Hx     Social History: I have reviewed social history from my progress note on 08/21/2017. Social History   Socioeconomic History  . Marital status: Divorced    Spouse name: Not on file  . Number of children: 2  . Years of education: Not on file  . Highest education level: High school graduate  Occupational History    Comment: disabled  Tobacco Use  . Smoking status: Former Smoker    Packs/day: 0.25    Years: 14.00    Pack years: 3.50    Types: Cigarettes    Quit date: 1992    Years since quitting: 29.0  . Smokeless tobacco: Former Systems developer    Quit date: 08/29/1990  Substance and Sexual Activity  . Alcohol use: Yes    Comment: occasional; once every 6 months  . Drug use: No  . Sexual activity: Yes    Birth control/protection: Surgical  Other Topics Concern  . Not on file  Social History Narrative  . Not on file   Social Determinants of Health   Financial Resource Strain:   . Difficulty of Paying Living Expenses: Not on file  Food Insecurity:   . Worried About Charity fundraiser in the Last Year: Not on file  . Ran Out  of Food in the Last Year: Not on file  Transportation Needs:   . Lack of Transportation (Medical): Not on file  . Lack of Transportation (Non-Medical): Not on file  Physical Activity:   . Days of Exercise per Week: Not on file  . Minutes of Exercise per Session: Not on file  Stress:   . Feeling of Stress : Not on file  Social Connections:   . Frequency of Communication with Friends and Family: Not on file  . Frequency of Social Gatherings with Friends and Family: Not on file  . Attends Religious Services: Not on file  . Active Member of Clubs  or Organizations: Not on file  . Attends Archivist Meetings: Not on file  . Marital Status: Not on file    Allergies:  Allergies  Allergen Reactions  . Levofloxacin Hives    REACTION: N \\T \ V  . Mobic [Meloxicam] Other (See Comments)    Calf tightness  . Propoxyphene Hcl     REACTION: N \\T \ V  . Tramadol Other (See Comments)    Severe headache  . Codeine Nausea Only    Slight nausea    Metabolic Disorder Labs: Lab Results  Component Value Date   HGBA1C 5.4 05/29/2016   MPG 108 05/29/2016   No results found for: PROLACTIN Lab Results  Component Value Date   CHOL 198 07/08/2018   TRIG 371 (H) 07/08/2018   HDL 46 07/08/2018   CHOLHDL 4.3 07/08/2018   VLDL 59 (H) 09/29/2016   LDLCALC 78 07/08/2018   LDLCALC 106 (H) 09/29/2016   Lab Results  Component Value Date   TSH 2.560 07/08/2018   TSH 2.050 10/18/2016    Therapeutic Level Labs: No results found for: LITHIUM No results found for: VALPROATE No components found for:  CBMZ  Current Medications: Current Outpatient Medications  Medication Sig Dispense Refill  . alendronate (FOSAMAX) 10 MG tablet Take by mouth.    . ARIPiprazole (ABILIFY) 2 MG tablet Take 0.5 tablets (1 mg total) by mouth daily. 15 tablet 1  . aspirin EC 81 MG tablet Take 81 mg by mouth daily.    Marland Kitchen atorvastatin (LIPITOR) 40 MG tablet Take 40 mg by mouth daily at 6 (six) AM.     .  cetirizine (ZYRTEC) 10 MG tablet Take by mouth.    . Cholecalciferol (VITAMIN D3) 5000 units TABS Take 5,000 Units by mouth daily.    . famotidine (PEPCID) 40 MG tablet famotidine 40 mg tablet    . famotidine (PEPCID) 40 MG tablet     . hydrOXYzine (VISTARIL) 25 MG capsule TAKE 1 CAPSULE(25 MG) BY MOUTH THREE TIMES DAILY AS NEEDED FOR ANXIETY 90 capsule 3  . influenza vaccine (FLUCELVAX QUADRIVALENT) 0.5 ML injection Flucelvax Quad 2020-2021 (PF) 60 mcg (15 mcg x 4)/0.5 mL IM syringe    . metoprolol succinate (TOPROL-XL) 25 MG 24 hr tablet TAKE 1 TABLET(25 MG) BY MOUTH DAILY 90 tablet 2  . mirtazapine (REMERON) 15 MG tablet Take 1 tablet (15 mg total) by mouth at bedtime. FOR SLEEP 30 tablet 3  . Multiple Vitamin (MULTIVITAMIN) tablet Take 1 tablet by mouth daily.    . potassium chloride SA (K-DUR) 20 MEQ tablet TAKE 2 TABLETS BY MOUTH DAILY 60 tablet 3  . triamcinolone cream (KENALOG) 0.1 % triamcinolone acetonide 0.1 % topical cream    . Vilazodone HCl (VIIBRYD) 40 MG TABS DAILY 30 tablet 3  . dexlansoprazole (DEXILANT) 60 MG capsule Take 60 mg by mouth daily.     Marland Kitchen linaclotide (LINZESS) 72 MCG capsule Take by mouth.    . ranitidine (ZANTAC) 300 MG tablet Take 300 mg by mouth at bedtime.      No current facility-administered medications for this visit.     Musculoskeletal: Strength & Muscle Tone: UTA Gait & Station: Reports as WNL Patient leans: N/A  Psychiatric Specialty Exam: Review of Systems  Psychiatric/Behavioral: The patient is nervous/anxious.   All other systems reviewed and are negative.   Last menstrual period 09/30/1991.There is no height or weight on file to calculate BMI.  General Appearance: UTA  Eye Contact:  UTA  Speech:  Clear and Coherent  Volume:  Normal  Mood:  Anxious  Affect:  UTA  Thought Process:  Goal Directed and Descriptions of Associations: Intact  Orientation:  Full (Time, Place, and Person)  Thought Content: Logical   Suicidal Thoughts:  No   Homicidal Thoughts:  No  Memory:  Immediate;   Fair Recent;   Fair Remote;   Fair  Judgement:  Fair  Insight:  Fair  Psychomotor Activity:  UTA  Concentration:  Concentration: Fair and Attention Span: Fair  Recall:  [employer] of Knowledge: Fair  Language: Fair  Akathisia:  No  Handed:  Right  AIMS (if indicated): Denies tremors, rigidity  Assets:  Communication Skills Desire for Improvement Housing Social Support  ADL's:  Intact  Cognition: WNL  Sleep:  restless   Screenings: PHQ2-9     Procedure visit from 07/24/2017 in [place] Office Visit from 07/16/2017 in [place] [facility] Procedure visit from 06/28/2017 in [place] [facility] Visit from 03/21/2017 in [place] Procedure visit from 02/20/2017 in [facility]  PHQ-2 Total Score  0  0  0  0  0       Assessment and Plan: Shalea is a 63 year old Caucasian female, widowed, lives in [place], has a history of depression, anxiety, chronic pain, insomnia, SVT was evaluated by phone today.  Patient is currently struggling with sleep problems and will benefit from medication readjustment.  Plan as noted below.  Plan Depression in remission Viibryd 40 mg p.o. daily Abilify at reduced dosage of 1 mg p.o. daily  Anxiety disorder-stable Viibryd as prescribed Hydroxyzine 25 mg p.o. 3 times daily as needed  Insomnia-restless Mirtazapine 15 mg p.o. nightly. Discussed with patient to work on sleep hygiene techniques Add hydroxyzine 25 mg at bedtime as needed.  She does have hydroxyzine available.   Patient advised to continue to work with her therapist.  Follow-up in clinic in 4 weeks or sooner if needed.  January 25 at 2 PM  I have spent atleast 15 minutes non face to face with patient today. More than 50 % of the time  was spent for psychoeducation and supportive psychotherapy and care coordination. This note was generated in part or whole with voice recognition software. Voice recognition is usually quite accurate but there are transcription errors that can and very often do occur. I apologize for any typographical errors that were not detected and corrected.       Ursula Alert, MD 05/26/2019, 5:21 PM

## 2019-05-28 ENCOUNTER — Other Ambulatory Visit: Payer: Self-pay | Admitting: Psychiatry

## 2019-05-28 DIAGNOSIS — F3342 Major depressive disorder, recurrent, in full remission: Secondary | ICD-10-CM

## 2019-05-28 DIAGNOSIS — F411 Generalized anxiety disorder: Secondary | ICD-10-CM

## 2019-06-18 ENCOUNTER — Ambulatory Visit (INDEPENDENT_AMBULATORY_CARE_PROVIDER_SITE_OTHER): Payer: Medicare HMO | Admitting: Licensed Clinical Social Worker

## 2019-06-18 ENCOUNTER — Encounter: Payer: Self-pay | Admitting: Licensed Clinical Social Worker

## 2019-06-18 ENCOUNTER — Other Ambulatory Visit: Payer: Self-pay

## 2019-06-18 DIAGNOSIS — F411 Generalized anxiety disorder: Secondary | ICD-10-CM | POA: Diagnosis not present

## 2019-06-18 DIAGNOSIS — F3342 Major depressive disorder, recurrent, in full remission: Secondary | ICD-10-CM | POA: Diagnosis not present

## 2019-06-18 NOTE — Progress Notes (Signed)
  Virtual Visit via Video Note  I connected with Wanda [name] on 06/18/19 at  1:30 PM EST by a video enabled telemedicine application and verified that I am speaking with the correct person using two identifiers.   I discussed the limitations of evaluation and management by telemedicine and the availability of in person appointments. The patient expressed understanding and agreed to proceed.  I discussed the assessment and treatment plan with the patient. The patient was provided an opportunity to ask questions and all were answered. The patient agreed with the plan and demonstrated an understanding of the instructions.   The patient was advised to call back or seek an in-person evaluation if the symptoms worsen or if the condition fails to improve as anticipated.  I provided 40 minutes of non-face-to-face time during this encounter.   Alden Hipp, LCSW   THERAPIST PROGRESS NOTE  Session Time: 1330  Participation Level: Active  Behavioral Response: NeatAlertDepressed  Type of Therapy: Individual Therapy  Treatment Goals addressed: Coping  Interventions: Supportive  Summary: Wanda [name] is a 64 y.o. female who presents with continued symptoms related to her diagnosis. Wanda [name] reports doing well since our last session, but noted things have "been a little crazy." Wanda [name] reminded LCSW that her grandson had moved back in with his mother (Wanda [name]'s daughter), and she'd been worried about that as he has substance use issues. Over the last month, Wanda [name] reports her grandson overdosed twice, and is currently in jail. He was given 5 months in jail, which could have been 26 months in prison instead, and he will be required to attend rehab following his release from jail. LCSW validated Wanda [name]'s feelings around the subject, and held space for her to discuss her thoughts and feelings. LCSW encouraged Wanda [name] to pay attention to her mental health while she is helping her daughter through this process, as  it is not selfish to take a moment when you feel overwhelmed. Wanda [name] expressed agreement and noted she had felt overwhelmed recently, and stated she has been sleeping quite a bit. LCSW encouraged Wanda [name] to listen to her body, and to sleep when she needs sleep. Wanda [name] expressed understanding and agreement. We discussed ways to set appropriate boundaries with both her daughter and her grandson, and how Wanda [name] could appropriately be there for her daughter without enabling her daughter's drinking.   Suicidal/Homicidal: No  Therapist Response: Wanda [name] continues to work towards her tx goals but has not yet reached them. We will continue to work on improving emotional regulation skills and distress tolerance skills moving forward.   Plan: Return again in 2 weeks.  Diagnosis: Axis I: Generalized Anxiety Disorder    Axis II: No diagnosis    Alden Hipp, LCSW 06/18/2019

## 2019-06-23 ENCOUNTER — Ambulatory Visit (INDEPENDENT_AMBULATORY_CARE_PROVIDER_SITE_OTHER): Payer: Medicare HMO | Admitting: Psychiatry

## 2019-06-23 ENCOUNTER — Other Ambulatory Visit: Payer: Self-pay

## 2019-06-23 ENCOUNTER — Encounter: Payer: Self-pay | Admitting: Psychiatry

## 2019-06-23 DIAGNOSIS — F4010 Social phobia, unspecified: Secondary | ICD-10-CM

## 2019-06-23 DIAGNOSIS — F331 Major depressive disorder, recurrent, moderate: Secondary | ICD-10-CM | POA: Diagnosis not present

## 2019-06-23 DIAGNOSIS — F411 Generalized anxiety disorder: Secondary | ICD-10-CM | POA: Diagnosis not present

## 2019-06-23 MED ORDER — ARIPIPRAZOLE 2 MG PO TABS: 2.0000 mg | ORAL_TABLET | Freq: Every day | ORAL | 1 refills | Status: DC

## 2019-06-23 NOTE — Progress Notes (Signed)
Provider Location : [facility] Patient Location : Home  Virtual Visit via Telephone Note  I connected with Wanda [name] on 06/23/19 at  2:00 PM EST by telephone and verified that I am speaking with the correct person using two identifiers.   I discussed the limitations, risks, security and privacy concerns of performing an evaluation and management service by telephone and the availability of in person appointments. I also discussed with the patient that there may be a patient responsible charge related to this service. The patient expressed understanding and agreed to proceed.      I discussed the assessment and treatment plan with the patient. The patient was provided an opportunity to ask questions and all were answered. The patient agreed with the plan and demonstrated an understanding of the instructions.   The patient was advised to call back or seek an in-person evaluation if the symptoms worsen or if the condition fails to improve as anticipated.   [facility] MD OP Progress Note  06/23/2019 4:36 PM Wanda [name]  MRN:  [id number]  Chief Complaint:  Chief Complaint    Follow-up     HPI: Shantice is a 64 year old Caucasian female, widowed, on SSD, lives in [place], has a history of GAD, MDD, SVT, orthostatic hypotension, hyperlipidemia was evaluated by phone today.  Patient preferred to do phone call.  Patient today reports she is currently struggling with some mood lability, tearfulness.  She feels depressed on and off.  She reports she is currently struggling with psychosocial stressors of her grandson who is currently struggling with drug abuse and is in jail.  He has been court ordered to go to a substance abuse program and she is hoping that might help him to get better.  She has a lot of racing thoughts about her situational stressors.  She is currently working with her therapist Ms. Alden Hipp which is beneficial.  She reports sleep is good.  Patient denies any suicidality,  homicidality or perceptual disturbances.  She is compliant on her medications as prescribed and denies side effects.  She denies any other concerns today. Visit Diagnosis:    ICD-10-CM   1. GAD (generalized anxiety disorder)  F41.1   2. MDD (major depressive disorder), recurrent episode, moderate (HCC)  F33.1 ARIPiprazole (ABILIFY) 2 MG tablet  3. Social anxiety disorder  F40.10     Past Psychiatric History: Reviewed past psychiatric history from my progress note on 08/21/2017.  Past trials of Zoloft, Cymbalta, Effexor, Paxil, Prozac, Wellbutrin, Xanax, prazosin, Seroquel, trazodone, doxepin.  Past Medical History:  Past Medical History:  Diagnosis Date  . Anxiety   . Borderline diabetes   . BRONCHITIS, ACUTE WITH BRONCHOSPASM 06/25/2010   Qualifier: Diagnosis of  By: Alveta Heimlich MD, Cornelia Copa    . Chronic prescription benzodiazepine use 10/18/2015  . Depression   . GASTROENTERITIS WITHOUT DEHYDRATION 05/14/2009   Qualifier: Diagnosis of  By: Deborra Medina MD, Tanja Port    . GERD (gastroesophageal reflux disease)   . Herpes   . 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 Hyperlipidemia   . Irregular heart beat   . Sinus tachycardia 04/11/2017  . Skin cancer of face 08/01/2018  . 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 Dossie Arbour Serial number: [id number] Model number: [id number]     Past Surgical History:  Procedure Laterality Date  . ABDOMINAL HYSTERECTOMY    . APPENDECTOMY    . BACK SURGERY  X 3  . BREAST EXCISIONAL BIOPSY Right    surgical bx age 38   . COLONOSCOPY N/A 08/28/2013   Procedure: COLONOSCOPY;  Surgeon: Danie Binder, MD;  Location: [facility];  Service: Endoscopy;  Laterality: N/A;  8:30 AM  . INTRAMEDULLARY (IM) NAIL INTERTROCHANTERIC Left 05/30/2016   Procedure: INTRAMEDULLARY (IM) NAIL INTERTROCHANTRIC;  Surgeon: Thornton Park, MD;  Location: [facility] ORS;  Service: Orthopedics;   Laterality: Left;  . SHOULDER SURGERY    . Spinal cord stimulator      Family Psychiatric History: Reviewed family psychiatric history from my progress note on 08/21/2017.  Family History:  Family History  Problem Relation Age of Onset  . Diabetes Mother   . COPD Mother   . Heart disease Mother   . Diabetes Father   . COPD Father   . Heart disease Father   . Heart attack Father 55       CABG  . Alcohol abuse Daughter   . Depression Daughter   . Drug abuse Daughter   . Anxiety disorder Daughter   . Tuberculosis Maternal Aunt   . Cancer Paternal Aunt        breast  . Breast cancer Paternal Aunt   . Diabetes Paternal Grandmother   . Diabetes Sister   . Cancer Cousin   . Diabetes Cousin   . Colon cancer Neg Hx   . Sudden Cardiac Death Neg Hx     Social History: Reviewed social history from my progress note on 08/21/2017. Social History   Socioeconomic History  . Marital status: Divorced    Spouse name: Not on file  . Number of children: 2  . Years of education: Not on file  . Highest education level: High school graduate  Occupational History    Comment: disabled  Tobacco Use  . Smoking status: Former Smoker    Packs/day: 0.25    Years: 14.00    Pack years: 3.50    Types: Cigarettes    Quit date: 1992    Years since quitting: 29.0  . Smokeless tobacco: Former Systems developer    Quit date: 08/29/1990  Substance and Sexual Activity  . Alcohol use: Yes    Comment: occasional; once every 6 months  . Drug use: No  . Sexual activity: Yes    Birth control/protection: Surgical  Other Topics Concern  . Not on file  Social History Narrative  . Not on file   Social Determinants of Health   Financial Resource Strain:   . Difficulty of Paying Living Expenses: Not on file  Food Insecurity:   . Worried About Charity fundraiser in the Last Year: Not on file  . Ran Out of Food in the Last Year: Not on file  Transportation Needs:   . Lack of Transportation (Medical): Not on file   . Lack of Transportation (Non-Medical): Not on file  Physical Activity:   . Days of Exercise per Week: Not on file  . Minutes of Exercise per Session: Not on file  Stress:   . Feeling of Stress : Not on file  Social Connections:   . Frequency of Communication with Friends and Family: Not on file  . Frequency of Social Gatherings with Friends and Family: Not on file  . Attends Religious Services: Not on file  . Active Member of Clubs or Organizations: Not on file  . Attends Archivist Meetings: Not on file  . Marital Status: Not on file    Allergies:  Allergies  Allergen Reactions  . Levofloxacin Hives    REACTION: N \\T \ V  . Mobic [Meloxicam] Other (See Comments)    Calf tightness  . Propoxyphene Hcl     REACTION: N \\T \ V  . Tramadol Other (See Comments)    Severe headache  . Codeine Nausea Only    Slight nausea    Metabolic Disorder Labs: Lab Results  Component Value Date   HGBA1C 5.4 05/29/2016   MPG 108 05/29/2016   No results found for: PROLACTIN Lab Results  Component Value Date   CHOL 198 07/08/2018   TRIG 371 (H) 07/08/2018   HDL 46 07/08/2018   CHOLHDL 4.3 07/08/2018   VLDL 59 (H) 09/29/2016   LDLCALC 78 07/08/2018   LDLCALC 106 (H) 09/29/2016   Lab Results  Component Value Date   TSH 2.560 07/08/2018   TSH 2.050 10/18/2016    Therapeutic Level Labs: No results found for: LITHIUM No results found for: VALPROATE No components found for:  CBMZ  Current Medications: Current Outpatient Medications  Medication Sig Dispense Refill  . alendronate (FOSAMAX) 10 MG tablet Take by mouth.    . ARIPiprazole (ABILIFY) 2 MG tablet Take 1 tablet (2 mg total) by mouth daily. 30 tablet 1  . aspirin EC 81 MG tablet Take 81 mg by mouth daily.    Marland Kitchen atorvastatin (LIPITOR) 40 MG tablet Take 40 mg by mouth daily at 6 (six) AM.     . cetirizine (ZYRTEC) 10 MG tablet Take by mouth.    . Cholecalciferol (VITAMIN D3) 5000 units TABS Take 5,000 Units by mouth  daily.    Marland Kitchen dexlansoprazole (DEXILANT) 60 MG capsule Take 60 mg by mouth daily.     . famotidine (PEPCID) 40 MG tablet famotidine 40 mg tablet    . famotidine (PEPCID) 40 MG tablet     . hydrOXYzine (VISTARIL) 25 MG capsule TAKE 1 CAPSULE(25 MG) BY MOUTH THREE TIMES DAILY AS NEEDED FOR ANXIETY 90 capsule 3  . influenza vaccine (FLUCELVAX QUADRIVALENT) 0.5 ML injection Flucelvax Quad 2020-2021 (PF) 60 mcg (15 mcg x 4)/0.5 mL IM syringe    . linaclotide (LINZESS) 72 MCG capsule Take by mouth.    . metoprolol succinate (TOPROL-XL) 25 MG 24 hr tablet TAKE 1 TABLET(25 MG) BY MOUTH DAILY 90 tablet 2  . mirtazapine (REMERON) 15 MG tablet TAKE 1 TABLET(15 MG) BY MOUTH AT BEDTIME FOR SLEEP 30 tablet 3  . Multiple Vitamin (MULTIVITAMIN) tablet Take 1 tablet by mouth daily.    . potassium chloride SA (K-DUR) 20 MEQ tablet TAKE 2 TABLETS BY MOUTH DAILY 60 tablet 3  . ranitidine (ZANTAC) 300 MG tablet Take 300 mg by mouth at bedtime.     . triamcinolone cream (KENALOG) 0.1 % triamcinolone acetonide 0.1 % topical cream    . Vilazodone HCl (VIIBRYD) 40 MG TABS DAILY 30 tablet 3   No current facility-administered medications for this visit.     Musculoskeletal: Strength & Muscle Tone: UTA Gait & Station: Reports as WNL Patient leans: N/A  Psychiatric Specialty Exam: Review of Systems  Psychiatric/Behavioral: Positive for dysphoric mood.  All other systems reviewed and are negative.   Last menstrual period 09/30/1991.There is no height or weight on file to calculate BMI.  General Appearance: UTA  Eye Contact:  UTA  Speech:  Clear and Coherent  Volume:  Normal  Mood:  Mood swings  Affect:  UTA  Thought Process:  Goal Directed and Descriptions of Associations: Intact  Orientation:  Full (Time, Place, and Person)  Thought Content: Logical   Suicidal Thoughts:  No  Homicidal Thoughts:  No  Memory:  Immediate;   Fair Recent;   Fair Remote;   Fair  Judgement:  Fair  Insight:  Fair  Psychomotor  Activity:  UTA  Concentration:  Concentration: Fair and Attention Span: Fair  Recall:  [employer] of Knowledge: Fair  Language: Fair  Akathisia:  No  Handed:  Right  AIMS (if indicated): UTA  Assets:  Communication Skills Desire for Improvement Housing Social Support  ADL's:  Intact  Cognition: WNL  Sleep:  Fair   Screenings: PHQ2-9     Procedure visit from 07/24/2017 in [facility] Visit from 07/16/2017 in [facility] Procedure visit from 06/28/2017 in [facility] Office Visit from 03/21/2017 in [facility] Procedure visit from 02/20/2017 in [facility]  PHQ-2 Total Score  0  0  0  0  0       Assessment and Plan: Elsa is a 64 year old Caucasian female, widowed, lives in [place] , has a history of depression, anxiety, chronic pain, insomnia, SVT was evaluated by phone today.  Patient is currently struggling with depressive symptoms, does have psychosocial stressors of her grandson who is struggling with substance abuse problems and legal issues.  Patient will continue to benefit from medication readjustment and psychotherapy sessions.  Plan Depression-unstable Viibryd 40 mg p.o. daily Increase Abilify to 2 mg p.o. daily  Anxiety disorder-stable Viibryd as prescribed Hydroxyzine 25 mg p.o. 3 times daily as needed Continue CBT with Ms. Alden Hipp  Insomnia-improving Mirtazapine 15 mg p.o. nightly   Follow-up in clinic in 4 weeks or sooner if needed.  February 22 at 2:40 PM  I have spent atleast 20 minutes non face to face with patient today. More than 50 % of the time was spent for  ordering medications and test ,psychoeducation and supportive psychotherapy and care coordination,as well as documenting clinical information in electronic health record. This  note was generated in part or whole with voice recognition software. Voice recognition is usually quite accurate but there are transcription errors that can and very often do occur. I apologize for any typographical errors that were not detected and corrected.      Ursula Alert, MD 06/23/2019, 4:36 PM

## 2019-07-08 ENCOUNTER — Ambulatory Visit: Payer: Medicare HMO | Admitting: Licensed Clinical Social Worker

## 2019-07-08 ENCOUNTER — Other Ambulatory Visit: Payer: Self-pay

## 2019-07-09 ENCOUNTER — Ambulatory Visit (INDEPENDENT_AMBULATORY_CARE_PROVIDER_SITE_OTHER): Payer: Medicare HMO | Admitting: Internal Medicine

## 2019-07-09 ENCOUNTER — Encounter: Payer: Self-pay | Admitting: Internal Medicine

## 2019-07-09 VITALS — BP 120/72 | HR 72 | Ht 61.0 in | Wt 142.5 lb

## 2019-07-09 DIAGNOSIS — R000 Tachycardia, unspecified: Secondary | ICD-10-CM | POA: Diagnosis not present

## 2019-07-09 DIAGNOSIS — I951 Orthostatic hypotension: Secondary | ICD-10-CM

## 2019-07-09 DIAGNOSIS — K219 Gastro-esophageal reflux disease without esophagitis: Secondary | ICD-10-CM

## 2019-07-09 DIAGNOSIS — E876 Hypokalemia: Secondary | ICD-10-CM

## 2019-07-09 MED ORDER — POTASSIUM CHLORIDE CRYS ER 20 MEQ PO TBCR: 40.0000 meq | EXTENDED_RELEASE_TABLET | Freq: Every day | ORAL | 3 refills | Status: DC

## 2019-07-09 NOTE — Progress Notes (Signed)
Follow-up Outpatient Visit Date: 07/09/2019  Primary Care Provider: Barbaraann Boys, [place] [address]  Chief Complaint: Follow-up orthostatic hypotension and PSVT  HPI:  Wanda [name] is a 64 y.o. female with history of orthostatic hypotension, hyperlipidemia, PSVT, and anxiety/depression, who presents for follow-up of palpitations and lightheadedness.  I last saw Wanda [name] in 12/2018, which time she was doing well other than occasional chest tightness, particularly when anxious/nervous.  Given normal myocardial perfusion stress test in 09/2016, we agreed to defer additional testing.  We continued low-dose metoprolol.  Wanda [name] has been following with psychiatry to assist with management of her anxiety and depression.  Today, Wanda [name] reports feeling well.  She had a brief episode of orthostatic lightheadedness when getting out of bed a few nights ago but has otherwise been without significant lightheadedness.  She is trying to stay well-hydrated.  She denies chest pain, shortness of breath, palpitations, and edema.  Prior atypical chest pain has resolved with famotidine use.  --------------------------------------------------------------------------------------------------  Cardiovascular History & Procedures: Cardiovascular Problems:  Orthostatic lightheadedness/fall  Palpitations with PSVT on event monitor  Risk Factors:  Hyperlipidemia  Cath/PCI:  None  CV Surgery:  None  EP Procedures and Devices:  Cardiac event monitor (06/28/16): Predominantly sinus rhythm with rare PACs and PVCs, as well as paroxysmal SVT (lasting up to 10.8 seconds).  Non-Invasive Evaluation(s):  Pharmacologic MPI (09/29/16): Low risk study without ischemia or scar. Nonspecific ST depression noted withregadenoson. LVEF 55-65%.   Bilateral carotid Doppler (05/31/16): Mild bilateral carotid atherosclerosis (<50%). Antegrade vertebral artery flow bilaterally.  TTE  (05/29/16): Technically difficult study. Normal LV size and function (LVEF 60-65%). No significant valvular abnormalities (though suboptimally visualized). Normal RV size and function.  Recent CV Pertinent Labs: Lab Results  Component Value Date   CHOL 198 07/08/2018   HDL 46 07/08/2018   LDLCALC 78 07/08/2018   LDLDIRECT 197.3 02/01/2010   TRIG 371 (H) 07/08/2018   CHOLHDL 4.3 07/08/2018   CHOLHDL 5.0 09/29/2016   K 3.7 07/09/2019   K 3.8 12/04/2012   MG 2.2 02/06/2017   BUN 9 07/09/2019   BUN 16 12/04/2012   CREATININE 0.67 07/09/2019   CREATININE 0.64 12/04/2012    Past medical and surgical history were reviewed and updated in EPIC.  Current Meds  Medication Sig  . ARIPiprazole (ABILIFY) 2 MG tablet Take 1 tablet (2 mg total) by mouth daily.  Marland Kitchen aspirin EC 81 MG tablet Take 81 mg by mouth daily.  Marland Kitchen atorvastatin (LIPITOR) 40 MG tablet Take 40 mg by mouth daily at 6 (six) AM.   . cetirizine (ZYRTEC) 10 MG tablet Take by mouth.  . Cholecalciferol (VITAMIN D3) 5000 units TABS Take 5,000 Units by mouth daily.  Marland Kitchen dexlansoprazole (DEXILANT) 60 MG capsule Take 60 mg by mouth daily.   . famotidine (PEPCID) 40 MG tablet famotidine 40 mg tablet  . famotidine (PEPCID) 40 MG tablet   . hydrOXYzine (VISTARIL) 25 MG capsule TAKE 1 CAPSULE(25 MG) BY MOUTH THREE TIMES DAILY AS NEEDED FOR ANXIETY  . influenza vaccine (FLUCELVAX QUADRIVALENT) 0.5 ML injection Flucelvax Quad 2020-2021 (PF) 60 mcg (15 mcg x 4)/0.5 mL IM syringe  . linaclotide (LINZESS) 72 MCG capsule Take by mouth.  . metoprolol succinate (TOPROL-XL) 25 MG 24 hr tablet TAKE 1 TABLET(25 MG) BY MOUTH DAILY  . mirtazapine (REMERON) 15 MG tablet TAKE 1 TABLET(15 MG) BY MOUTH AT BEDTIME FOR SLEEP  . Multiple Vitamin (MULTIVITAMIN) tablet Take 1 tablet by  mouth daily.  . potassium chloride SA (KLOR-CON) 20 MEQ tablet Take 2 tablets (40 mEq total) by mouth daily.  Marland Kitchen triamcinolone cream (KENALOG) 0.1 % triamcinolone acetonide 0.1 %  topical cream  . Vilazodone HCl (VIIBRYD) 40 MG TABS DAILY  . [DISCONTINUED] potassium chloride SA (K-DUR) 20 MEQ tablet TAKE 2 TABLETS BY MOUTH DAILY    Allergies: Levofloxacin, Mobic [meloxicam], Propoxyphene hcl, Tramadol, and Codeine  Social History   Tobacco Use  . Smoking status: Former Smoker    Packs/day: 0.25    Years: 14.00    Pack years: 3.50    Types: Cigarettes    Quit date: 1992    Years since quitting: 29.1  . Smokeless tobacco: Former Systems developer    Quit date: 08/29/1990  Substance Use Topics  . Alcohol use: Yes    Comment: occasional; once every 6 months  . Drug use: No    Family History  Problem Relation Age of Onset  . Diabetes Mother   . COPD Mother   . Heart disease Mother   . Diabetes Father   . COPD Father   . Heart disease Father   . Heart attack Father 15       CABG  . Alcohol abuse Daughter   . Depression Daughter   . Drug abuse Daughter   . Anxiety disorder Daughter   . Tuberculosis Maternal Aunt   . Cancer Paternal Aunt        breast  . Breast cancer Paternal Aunt   . Diabetes Paternal Grandmother   . Diabetes Sister   . Cancer Cousin   . Diabetes Cousin   . Colon cancer Neg Hx   . Sudden Cardiac Death Neg Hx     Review of Systems: A 12-system review of systems was performed and was negative except as noted in the HPI.  --------------------------------------------------------------------------------------------------  Physical Exam: BP 120/72 (BP Location: Left Arm, Patient Position: Sitting, Cuff Size: Normal)   Pulse 72   Ht 5\' 1"  (1.549 m)   Wt 142 lb 8 oz (64.6 kg)   LMP 09/30/1991 (Approximate) Comment: 1993  BMI 26.93 kg/m   General:  NAD. Neck: No JVD Lungs: Normal work of breathing. Clear to auscultation bilaterally without wheezes or crackles. Heart: Regular rate and rhythm without murmurs, rubs, or gallops Abd: Bowel sounds present. Soft, NT/ND. Ext: No lower extremity edema.  EKG:  NSR with non-specific ST/T  changes.  HR has decreased since 01/08/2019; otherwise, there has been no significant change.  Lab Results  Component Value Date   WBC 7.4 07/08/2018   HGB 13.7 07/08/2018   HCT 39.1 07/08/2018   MCV 94 07/08/2018   PLT 236 07/08/2018    Lab Results  Component Value Date   NA 141 07/09/2019   K 3.7 07/09/2019   CL 105 07/09/2019   CO2 19 (L) 07/09/2019   BUN 9 07/09/2019   CREATININE 0.67 07/09/2019   GLUCOSE 96 07/09/2019   ALT 24 07/08/2018    Lab Results  Component Value Date   CHOL 198 07/08/2018   HDL 46 07/08/2018   LDLCALC 78 07/08/2018   LDLDIRECT 197.3 02/01/2010   TRIG 371 (H) 07/08/2018   CHOLHDL 4.3 07/08/2018    --------------------------------------------------------------------------------------------------  ASSESSMENT AND PLAN: Orthostatic hypotension: Minimal symptoms without further falls.  I encouraged continued aggressive hydration.  Sinus tachycardia: HR upper normal today.  Continue low-dose metoprolol and hydration.  No further workup planned.  GERD: Prior chest pain has resolved with famotidine use.  Wanda [name] can continue to use this as needed.  Hypokalemia: I will check a BMP today to ensure appropriate potassium and normal renal function.  Continue potassium supplementation.  Follow-up: Return to clinic in 1 year.  Nelva Bush, MD 07/10/2019 9:59 PM

## 2019-07-09 NOTE — Patient Instructions (Signed)
Medication Instructions:  Your physician recommends that you continue on your current medications as directed. Please refer to the Current Medication list given to you today.  *If you need a refill on your cardiac medications before your next appointment, please call your pharmacy*  Lab Work: Your physician recommends that you return for lab work in: [place].  If you have labs (blood work) drawn today and your tests are completely normal, you will receive your results only by: Marland Kitchen MyChart Message (if you have MyChart) OR . A paper copy in the mail If you have any lab test that is abnormal or we need to change your treatment, we will call you to review the results.  Testing/Procedures: none  Follow-Up: At [facility], you and your health needs are our priority.  As part of our continuing mission to provide you with exceptional heart care, we have created designated Provider Care Teams.  These Care Teams include your primary Cardiologist (physician) and Advanced Practice Providers (APPs -  Physician Assistants and Nurse Practitioners) who all work together to provide you with the care you need, when you need it.  Your next appointment:   12 month(s)  The format for your next appointment:   In Person  Provider:    You may see DR Harrell Gave END or one of the following Advanced Practice Providers on your designated Care Team:    Murray Hodgkins, NP  Christell Faith, PA-C  Marrianne Mood, PA-C

## 2019-07-10 ENCOUNTER — Encounter: Payer: Self-pay | Admitting: Internal Medicine

## 2019-07-10 LAB — BASIC METABOLIC PANEL
BUN/Creatinine Ratio: 13 (ref 12–28)
BUN: 9 mg/dL (ref 8–27)
CO2: 19 mmol/L — ABNORMAL LOW (ref 20–29)
Calcium: 9.5 mg/dL (ref 8.7–10.3)
Chloride: 105 mmol/L (ref 96–106)
Creatinine, Ser: 0.67 mg/dL (ref 0.57–1.00)
GFR calc Af Amer: 108 mL/min/{1.73_m2} (ref >59)
GFR calc non Af Amer: 94 mL/min/{1.73_m2} (ref >59)
Glucose: 96 mg/dL (ref 65–99)
Potassium: 3.7 mmol/L (ref 3.5–5.2)
Sodium: 141 mmol/L (ref 134–144)

## 2019-07-14 ENCOUNTER — Encounter: Payer: Self-pay | Admitting: *Deleted

## 2019-07-18 ENCOUNTER — Ambulatory Visit (INDEPENDENT_AMBULATORY_CARE_PROVIDER_SITE_OTHER): Payer: Medicare HMO | Admitting: Licensed Clinical Social Worker

## 2019-07-18 ENCOUNTER — Other Ambulatory Visit: Payer: Self-pay

## 2019-07-18 ENCOUNTER — Encounter: Payer: Self-pay | Admitting: Licensed Clinical Social Worker

## 2019-07-18 DIAGNOSIS — F411 Generalized anxiety disorder: Secondary | ICD-10-CM | POA: Diagnosis not present

## 2019-07-18 NOTE — Progress Notes (Signed)
Virtual Visit via Video Note  I connected with [name] on 07/18/19 at 11:00 AM EST by a video enabled telemedicine application and verified that I am speaking with the correct person using two identifiers.   I discussed the limitations of evaluation and management by telemedicine and the availability of in person appointments. The patient expressed understanding and agreed to proceed.  I discussed the assessment and treatment plan with the patient. The patient was provided an opportunity to ask questions and all were answered. The patient agreed with the plan and demonstrated an understanding of the instructions.   The patient was advised to call back or seek an in-person evaluation if the symptoms worsen or if the condition fails to improve as anticipated.  I provided 60 minutes of non-face-to-face time during this encounter.   Alden Hipp, LCSW    THERAPIST PROGRESS NOTE  Session Time: 1100  Participation Level: Active  Behavioral Response: NeatAlertAnxious  Type of Therapy: Individual Therapy  Treatment Goals addressed: Coping  Interventions: Supportive  Summary: Wanda [name] is a 64 y.o. female who presents with continued symptoms related to her diagnosis. Taleeya reports doing well since our last session. She reports some stressful events, such as needing to take her car into the dealership to be worked on. Teresea reported she was able to get this scheduled and ensure she was provided a car to use while hers was being worked on. LCSW validated Jacquette's feelings and reemphasized how stressful car issues can be. Brexleigh went on to discuss her family dynamics. She explained why she has not spoken to one of her sisters in years, and how it is something that has really bothered her. She reported her sister facilitated "shady decisions," regarding their father's money/estate when he passed, and she has never owned her decisions or apologized for it. However, Katylin reports she has  attempted to reach out several times, and each time her sister ignored her. Because of this, Ditya feels she is done with the relationship. LCSW validated these feelings and encouraged Kameelah to think about the level this situation bothers her, and think about if that is something she is willing to live with. If not, then she might need to consider reaching out again and attempting to put this to rest, or if she is able to live with it, then obviously that would not be needed. Further, we discussed ways we could begin processing past problems in the family to offer a new sense of clarity. Lorrae expressed understanding and agreement with this information as well.   Suicidal/Homicidal: No  Therapist Response: Keyana continues to work towards her tx goals but has not yet reached them. We will continue to work on improving emotional regulation skills and distress tolerance moving forward. We will also continue to work on improving assertiveness.   Plan: Return again in 4 weeks.  Diagnosis: Axis I: Generalized Anxiety Disorder    Axis II: No diagnosis    Alden Hipp, LCSW 07/18/2019

## 2019-07-21 ENCOUNTER — Other Ambulatory Visit: Payer: Self-pay | Admitting: Psychiatry

## 2019-07-21 ENCOUNTER — Other Ambulatory Visit: Payer: Self-pay

## 2019-07-21 ENCOUNTER — Ambulatory Visit (INDEPENDENT_AMBULATORY_CARE_PROVIDER_SITE_OTHER): Payer: Medicare HMO | Admitting: Psychiatry

## 2019-07-21 ENCOUNTER — Encounter: Payer: Self-pay | Admitting: Psychiatry

## 2019-07-21 DIAGNOSIS — F411 Generalized anxiety disorder: Secondary | ICD-10-CM | POA: Diagnosis not present

## 2019-07-21 DIAGNOSIS — F4010 Social phobia, unspecified: Secondary | ICD-10-CM | POA: Diagnosis not present

## 2019-07-21 DIAGNOSIS — F3341 Major depressive disorder, recurrent, in partial remission: Secondary | ICD-10-CM

## 2019-07-21 NOTE — Progress Notes (Signed)
Provider Location : [facility] Patient Location : Home   Virtual Visit via Telephone Note  I connected with Wanda [name] on 07/21/19 at  2:40 PM EST by telephone and verified that I am speaking with the correct person using two identifiers.   I discussed the limitations, risks, security and privacy concerns of performing an evaluation and management service by telephone and the availability of in person appointments. I also discussed with the patient that there may be a patient responsible charge related to this service. The patient expressed understanding and agreed to proceed.    I discussed the assessment and treatment plan with the patient. The patient was provided an opportunity to ask questions and all were answered. The patient agreed with the plan and demonstrated an understanding of the instructions.   The patient was advised to call back or seek an in-person evaluation if the symptoms worsen or if the condition fails to improve as anticipated.  [facility] MD OP Progress Note  07/21/2019 2:53 PM Wanda [name]  MRN:  [id number]  Chief Complaint:  Chief Complaint    Follow-up     HPI: Wanda [name] is a 64 year old Caucasian female, widowed, on SSD, lives in [place], has a history of GAD, MDD, SVT, orthostatic hypotension, hyperlipidemia was evaluated by phone today.  Patient preferred to do a phone call.  Patient today reports she is currently doing well on the current medication regimen.  Her mood symptoms are currently stable.  She reports sleep is restless at times due to her having to take care of her puppy who had recent surgery.  Patient denies any suicidality, homicidality or perceptual disturbances.  Patient denies any other concerns today.  Visit Diagnosis:    ICD-10-CM   1. GAD (generalized anxiety disorder)  F41.1   2. MDD (major depressive disorder), recurrent, in partial remission ([place])  F33.41   3. Social anxiety disorder  F40.10     Past Psychiatric History: I have  reviewed past psychiatric history from my progress note on 08/21/2017.  Past trials of Zoloft, Cymbalta, Effexor, Paxil, Prozac, Wellbutrin, Xanax, prazosin, Seroquel, trazodone, doxepin Past Medical History:  Past Medical History:  Diagnosis Date  . Anxiety   . Borderline diabetes   . BRONCHITIS, ACUTE WITH BRONCHOSPASM 06/25/2010   Qualifier: Diagnosis of  By: Alveta Heimlich MD, Cornelia Copa    . Chronic prescription benzodiazepine use 10/18/2015  . Depression   . GASTROENTERITIS WITHOUT DEHYDRATION 05/14/2009   Qualifier: Diagnosis of  By: Deborra Medina MD, Tanja Port    . GERD (gastroesophageal reflux disease)   . Herpes   . 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 Hyperlipidemia   . Irregular heart beat   . Sinus tachycardia 04/11/2017  . Skin cancer of face 08/01/2018  . 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 Dossie Arbour Serial number: [id number] Model number: [id number]     Past Surgical History:  Procedure Laterality Date  . ABDOMINAL HYSTERECTOMY    . APPENDECTOMY    . BACK SURGERY     X 3  . BREAST EXCISIONAL BIOPSY Right    surgical bx age 6   . COLONOSCOPY N/A 08/28/2013   Procedure: COLONOSCOPY;  Surgeon: Danie Binder, MD;  Location: [facility];  Service: Endoscopy;  Laterality: N/A;  8:30 AM  . INTRAMEDULLARY (IM) NAIL INTERTROCHANTERIC Left 05/30/2016   Procedure: INTRAMEDULLARY (IM) NAIL INTERTROCHANTRIC;  Surgeon: Thornton Park, MD;  Location: [facility] ORS;  Service: Orthopedics;  Laterality: Left;  . SHOULDER SURGERY    . Spinal cord stimulator      Family Psychiatric History: I have reviewed family psychiatric history from my progress note on 08/21/2017.  Family History:  Family History  Problem Relation Age of Onset  . Diabetes Mother   . COPD Mother   . Heart disease Mother   . Diabetes Father   . COPD Father   . Heart disease Father   . Heart attack Father 11       CABG  .  Alcohol abuse Daughter   . Depression Daughter   . Drug abuse Daughter   . Anxiety disorder Daughter   . Tuberculosis Maternal Aunt   . Cancer Paternal Aunt        breast  . Breast cancer Paternal Aunt   . Diabetes Paternal Grandmother   . Diabetes Sister   . Cancer Cousin   . Diabetes Cousin   . Colon cancer Neg Hx   . Sudden Cardiac Death Neg Hx     Social History: Reviewed social history from my progress note on 08/21/2017. Social History   Socioeconomic History  . Marital status: Divorced    Spouse name: Not on file  . Number of children: 2  . Years of education: Not on file  . Highest education level: High school graduate  Occupational History    Comment: disabled  Tobacco Use  . Smoking status: Former Smoker    Packs/day: 0.25    Years: 14.00    Pack years: 3.50    Types: Cigarettes    Quit date: 1992    Years since quitting: 29.1  . Smokeless tobacco: Former Systems developer    Quit date: 08/29/1990  Substance and Sexual Activity  . Alcohol use: Yes    Comment: occasional; once every 6 months  . Drug use: No  . Sexual activity: Yes    Birth control/protection: Surgical  Other Topics Concern  . Not on file  Social History Narrative  . Not on file   Social Determinants of Health   Financial Resource Strain:   . Difficulty of Paying Living Expenses: Not on file  Food Insecurity:   . Worried About Charity fundraiser in the Last Year: Not on file  . Ran Out of Food in the Last Year: Not on file  Transportation Needs:   . Lack of Transportation (Medical): Not on file  . Lack of Transportation (Non-Medical): Not on file  Physical Activity:   . Days of Exercise per Week: Not on file  . Minutes of Exercise per Session: Not on file  Stress:   . Feeling of Stress : Not on file  Social Connections:   . Frequency of Communication with Friends and Family: Not on file  . Frequency of Social Gatherings with Friends and Family: Not on file  . Attends Religious Services: Not  on file  . Active Member of Clubs or Organizations: Not on file  . Attends Archivist Meetings: Not on file  . Marital Status: Not on file    Allergies:  Allergies  Allergen Reactions  . Levofloxacin Hives    REACTION: N \\T \ V  . Mobic [Meloxicam] Other (See Comments)    Calf tightness  . Propoxyphene Hcl     REACTION: N \\T \ V  . Tramadol Other (See Comments)    Severe headache  . Codeine Nausea Only    Slight nausea    Metabolic Disorder Labs: Lab  Results  Component Value Date   HGBA1C 5.4 05/29/2016   MPG 108 05/29/2016   No results found for: PROLACTIN Lab Results  Component Value Date   CHOL 198 07/08/2018   TRIG 371 (H) 07/08/2018   HDL 46 07/08/2018   CHOLHDL 4.3 07/08/2018   VLDL 59 (H) 09/29/2016   LDLCALC 78 07/08/2018   LDLCALC 106 (H) 09/29/2016   Lab Results  Component Value Date   TSH 2.560 07/08/2018   TSH 2.050 10/18/2016    Therapeutic Level Labs: No results found for: LITHIUM No results found for: VALPROATE No components found for:  CBMZ  Current Medications: Current Outpatient Medications  Medication Sig Dispense Refill  . ARIPiprazole (ABILIFY) 2 MG tablet Take 1 tablet (2 mg total) by mouth daily. 30 tablet 1  . aspirin EC 81 MG tablet Take 81 mg by mouth daily.    Marland Kitchen atorvastatin (LIPITOR) 40 MG tablet Take 40 mg by mouth daily at 6 (six) AM.     . cetirizine (ZYRTEC) 10 MG tablet Take by mouth.    . Cholecalciferol (VITAMIN D3) 5000 units TABS Take 5,000 Units by mouth daily.    Marland Kitchen dexlansoprazole (DEXILANT) 60 MG capsule Take 60 mg by mouth daily.     . famotidine (PEPCID) 40 MG tablet famotidine 40 mg tablet    . famotidine (PEPCID) 40 MG tablet     . hydrOXYzine (VISTARIL) 25 MG capsule TAKE 1 CAPSULE(25 MG) BY MOUTH THREE TIMES DAILY AS NEEDED FOR ANXIETY 90 capsule 3  . influenza vaccine (FLUCELVAX QUADRIVALENT) 0.5 ML injection Flucelvax Quad 2020-2021 (PF) 60 mcg (15 mcg x 4)/0.5 mL IM syringe    . linaclotide  (LINZESS) 72 MCG capsule Take by mouth.    . metoprolol succinate (TOPROL-XL) 25 MG 24 hr tablet TAKE 1 TABLET(25 MG) BY MOUTH DAILY 90 tablet 2  . mirtazapine (REMERON) 15 MG tablet TAKE 1 TABLET(15 MG) BY MOUTH AT BEDTIME FOR SLEEP 30 tablet 3  . Multiple Vitamin (MULTIVITAMIN) tablet Take 1 tablet by mouth daily.    . potassium chloride SA (KLOR-CON) 20 MEQ tablet Take 2 tablets (40 mEq total) by mouth daily. 180 tablet 3  . triamcinolone cream (KENALOG) 0.1 % triamcinolone acetonide 0.1 % topical cream    . Vilazodone HCl (VIIBRYD) 40 MG TABS DAILY 30 tablet 3   No current facility-administered medications for this visit.     Musculoskeletal: Strength & Muscle Tone: UTA Gait & Station: Reports as WNL Patient leans: N/A  Psychiatric Specialty Exam: Review of Systems  Psychiatric/Behavioral: Positive for sleep disturbance.  All other systems reviewed and are negative.   Last menstrual period 09/30/1991.There is no height or weight on file to calculate BMI.  General Appearance: UTA  Eye Contact:  UTA  Speech:  Clear and Coherent  Volume:  Normal  Mood:  Euthymic  Affect:  UTA  Thought Process:  Goal Directed and Descriptions of Associations: Intact  Orientation:  Full (Time, Place, and Person)  Thought Content: Logical   Suicidal Thoughts:  No  Homicidal Thoughts:  No  Memory:  Immediate;   Fair Recent;   Fair Remote;   Fair  Judgement:  Fair  Insight:  Fair  Psychomotor Activity:  UTA  Concentration:  Concentration: Fair and Attention Span: Fair  Recall:  [employer] of Knowledge: Fair  Language: Fair  Akathisia:  No  Handed:  Right  AIMS (if indicated):UTA  Assets:  Communication Skills Desire for Improvement Housing Social Support  ADL's:  Intact  Cognition: WNL  Sleep:  Restless due to having to take care of her puppy   Screenings: PHQ2-9     Procedure visit from 07/24/2017 in [facility] Visit from  07/16/2017 in [facility] Procedure visit from 06/28/2017 in [facility] Visit from 03/21/2017 in [facility] Procedure visit from 02/20/2017 in [facility]  PHQ-2 Total Score  0  0  0  0  0       Assessment and Plan: Wanda [name] is a 64 year old Caucasian female, widowed, lives in [place] has a history of depression, anxiety, chronic pain, insomnia, SVT was evaluated by phone today.  Patient is currently making progress on the current medication regimen.  Plan as noted below.  Plan Depression-stable Viibryd 40 mg p.o. daily Abilify 2 mg p.o. daily  Anxiety disorder-stable Viibryd as prescribed Hydroxyzine 25 mg p.o. 3 times daily as needed Continue CBT with Ms. Alden Hipp  Insomnia-stable Mirtazapine 15 mg p.o. nightly.  She does have restless sleep on and off due to having a new puppy.  Follow-up in clinic in 2 months or sooner if needed.   I have spent atleast 20 minutes non  face to face with patient today. More than 50 % of the time was spent for ordering medications and test ,psychoeducation and supportive psychotherapy and care coordination,as well as documenting clinical information in electronic health record.  This note was generated in part or whole with voice recognition software. Voice recognition is usually quite accurate but there are transcription errors that can and very often do occur. I apologize for any typographical errors that were not detected and corrected.        Ursula Alert, MD 07/21/2019, 2:53 PM

## 2019-08-15 ENCOUNTER — Encounter: Payer: Self-pay | Admitting: Licensed Clinical Social Worker

## 2019-08-15 ENCOUNTER — Ambulatory Visit (INDEPENDENT_AMBULATORY_CARE_PROVIDER_SITE_OTHER): Payer: Medicare HMO | Admitting: Licensed Clinical Social Worker

## 2019-08-15 ENCOUNTER — Other Ambulatory Visit: Payer: Self-pay

## 2019-08-15 DIAGNOSIS — F411 Generalized anxiety disorder: Secondary | ICD-10-CM
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
## 2019-09-02 ENCOUNTER — Other Ambulatory Visit: Payer: Self-pay

## 2019-09-02 ENCOUNTER — Encounter: Payer: Self-pay | Admitting: Licensed Clinical Social Worker

## 2019-09-02 ENCOUNTER — Ambulatory Visit (INDEPENDENT_AMBULATORY_CARE_PROVIDER_SITE_OTHER): Payer: Medicare HMO | Admitting: Licensed Clinical Social Worker

## 2019-09-02 DIAGNOSIS — F411 Generalized anxiety disorder: Secondary | ICD-10-CM | POA: Diagnosis not present
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
## 2019-09-05 ENCOUNTER — Other Ambulatory Visit: Payer: Self-pay | Admitting: Psychiatry

## 2019-09-05 DIAGNOSIS — F331 Major depressive disorder, recurrent, moderate: Secondary | ICD-10-CM

## 2019-09-15 ENCOUNTER — Encounter: Payer: Self-pay | Admitting: Psychiatry

## 2019-09-15 ENCOUNTER — Telehealth (INDEPENDENT_AMBULATORY_CARE_PROVIDER_SITE_OTHER): Payer: Medicare HMO | Admitting: Psychiatry

## 2019-09-15 ENCOUNTER — Other Ambulatory Visit: Payer: Self-pay

## 2019-09-15 DIAGNOSIS — F4010 Social phobia, unspecified: Secondary | ICD-10-CM

## 2019-09-15 DIAGNOSIS — F3342 Major depressive disorder, recurrent, in full remission: Secondary | ICD-10-CM

## 2019-09-15 DIAGNOSIS — F411 Generalized anxiety disorder: Secondary | ICD-10-CM | POA: Diagnosis not present

## 2019-09-15 MED ORDER — MIRTAZAPINE 15 MG PO TABS: ORAL_TABLET | ORAL | 3 refills | Status: DC

## 2019-09-15 MED ORDER — VIIBRYD 40 MG PO TABS: ORAL_TABLET | ORAL | 3 refills | Status: DC

## 2019-09-15 NOTE — Progress Notes (Signed)
Provider Location : [facility] Patient Location : Home  Virtual Visit via Video Note  I connected with Wanda [name] on 09/15/19 at  4:00 PM EDT by a video enabled telemedicine application and verified that I am speaking with the correct person using two identifiers.   I discussed the limitations of evaluation and management by telemedicine and the availability of in person appointments. The patient expressed understanding and agreed to proceed.  I discussed the assessment and treatment plan with the patient. The patient was provided an opportunity to ask questions and all were answered. The patient agreed with the plan and demonstrated an understanding of the instructions.   The patient was advised to call back or seek an in-person evaluation if the symptoms worsen or if the condition fails to improve as anticipated.   [place] MD OP Progress Note  09/15/2019 4:19 PM Wanda [name]  MRN:  [id number]  Chief Complaint:  Chief Complaint    Follow-up     HPI: Wanda [name] is a 64 year old Caucasian female, widowed, on SSD, lives in [place], has a history of GAD, MDD, SVT, orthostatic hypotension, hyperlipidemia was evaluated by telemedicine today.  Patient today reports she is currently doing well on the current medication regimen.  She denies any significant anxiety or depressive symptoms.  She reports she recently received her COVID-19 vaccination and currently struggles from possible side effects from it.  She does report soreness at the injection site as well as fatigue and headaches.  She however reports it is getting better.  Patient denies any suicidality, homicidality or perceptual disturbances.  She reports sleep is good.  She reports she is spending a lot of time outdoors and also spends a lot of time with her puppy which is therapeutic for her.  Patient denies any other concerns today. Visit Diagnosis:    ICD-10-CM   1. GAD (generalized anxiety disorder)  F41.1 Vilazodone HCl (VIIBRYD) 40  MG TABS    mirtazapine (REMERON) 15 MG tablet   stable  2. MDD (major depressive disorder), recurrent, in full remission ([place])  F33.42 Vilazodone HCl (VIIBRYD) 40 MG TABS    mirtazapine (REMERON) 15 MG tablet  3. Social anxiety disorder  F40.10     Past Psychiatric History: I have reviewed past psychiatric history from my progress note on 08/21/2017.  Past trials of Zoloft, Cymbalta, Effexor, Paxil, Prozac, Wellbutrin, Xanax, prazosin, Seroquel, trazodone, doxepin.  Past Medical History:  Past Medical History:  Diagnosis Date  . Anxiety   . Borderline diabetes   . BRONCHITIS, ACUTE WITH BRONCHOSPASM 06/25/2010   Qualifier: Diagnosis of  By: Alveta Heimlich MD, Cornelia Copa    . Chronic prescription benzodiazepine use 10/18/2015  . Depression   . GASTROENTERITIS WITHOUT DEHYDRATION 05/14/2009   Qualifier: Diagnosis of  By: Deborra Medina MD, Tanja Port    . GERD (gastroesophageal reflux disease)   . Herpes   . 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 Hyperlipidemia   . Irregular heart beat   . Sinus tachycardia 04/11/2017  . Skin cancer of face 08/01/2018  . 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 Dossie Arbour Serial number: [id number] Model number: [id number]     Past Surgical History:  Procedure Laterality Date  . ABDOMINAL HYSTERECTOMY    . APPENDECTOMY    . BACK SURGERY     X 3  . BREAST EXCISIONAL BIOPSY Right    surgical bx age 34   . COLONOSCOPY N/A  08/28/2013   Procedure: COLONOSCOPY;  Surgeon: Danie Binder, MD;  Location: [facility];  Service: Endoscopy;  Laterality: N/A;  8:30 AM  . INTRAMEDULLARY (IM) NAIL INTERTROCHANTERIC Left 05/30/2016   Procedure: INTRAMEDULLARY (IM) NAIL INTERTROCHANTRIC;  Surgeon: Thornton Park, MD;  Location: [facility] ORS;  Service: Orthopedics;  Laterality: Left;  . SHOULDER SURGERY    . Spinal cord stimulator      Family Psychiatric History: I have reviewed family  psychiatric history from my progress note on 08/21/2017  Family History:  Family History  Problem Relation Age of Onset  . Diabetes Mother   . COPD Mother   . Heart disease Mother   . Diabetes Father   . COPD Father   . Heart disease Father   . Heart attack Father 39       CABG  . Alcohol abuse Daughter   . Depression Daughter   . Drug abuse Daughter   . Anxiety disorder Daughter   . Tuberculosis Maternal Aunt   . Cancer Paternal Aunt        breast  . Breast cancer Paternal Aunt   . Diabetes Paternal Grandmother   . Diabetes Sister   . Cancer Cousin   . Diabetes Cousin   . Colon cancer Neg Hx   . Sudden Cardiac Death Neg Hx     Social History: Reviewed social history from my progress note on 08/21/2017 Social History   Socioeconomic History  . Marital status: Divorced    Spouse name: Not on file  . Number of children: 2  . Years of education: Not on file  . Highest education level: High school graduate  Occupational History    Comment: disabled  Tobacco Use  . Smoking status: Former Smoker    Packs/day: 0.25    Years: 14.00    Pack years: 3.50    Types: Cigarettes    Quit date: 1992    Years since quitting: 29.3  . Smokeless tobacco: Former Systems developer    Quit date: 08/29/1990  Substance and Sexual Activity  . Alcohol use: Yes    Comment: occasional; once every 6 months  . Drug use: No  . Sexual activity: Yes    Birth control/protection: Surgical  Other Topics Concern  . Not on file  Social History Narrative  . Not on file   Social Determinants of Health   Financial Resource Strain:   . Difficulty of Paying Living Expenses:   Food Insecurity:   . Worried About Charity fundraiser in the Last Year:   . Arboriculturist in the Last Year:   Transportation Needs:   . Film/video editor (Medical):   Marland Kitchen Lack of Transportation (Non-Medical):   Physical Activity:   . Days of Exercise per Week:   . Minutes of Exercise per Session:   Stress:   . Feeling of  Stress :   Social Connections:   . Frequency of Communication with Friends and Family:   . Frequency of Social Gatherings with Friends and Family:   . Attends Religious Services:   . Active Member of Clubs or Organizations:   . Attends Archivist Meetings:   Marland Kitchen Marital Status:     Allergies:  Allergies  Allergen Reactions  . Levofloxacin Hives    REACTION: N \\T \ V  . Mobic [Meloxicam] Other (See Comments)    Calf tightness  . Propoxyphene Hcl     REACTION: N \\T \ V  . Tramadol Other (See Comments)  Severe headache  . Codeine Nausea Only    Slight nausea    Metabolic Disorder Labs: Lab Results  Component Value Date   HGBA1C 5.4 05/29/2016   MPG 108 05/29/2016   No results found for: PROLACTIN Lab Results  Component Value Date   CHOL 198 07/08/2018   TRIG 371 (H) 07/08/2018   HDL 46 07/08/2018   CHOLHDL 4.3 07/08/2018   VLDL 59 (H) 09/29/2016   LDLCALC 78 07/08/2018   LDLCALC 106 (H) 09/29/2016   Lab Results  Component Value Date   TSH 2.560 07/08/2018   TSH 2.050 10/18/2016    Therapeutic Level Labs: No results found for: LITHIUM No results found for: VALPROATE No components found for:  CBMZ  Current Medications: Current Outpatient Medications  Medication Sig Dispense Refill  . ARIPiprazole (ABILIFY) 2 MG tablet TAKE 1 TABLET(2 MG) BY MOUTH DAILY 30 tablet 1  . aspirin EC 81 MG tablet Take 81 mg by mouth daily.    Marland Kitchen atorvastatin (LIPITOR) 40 MG tablet Take 40 mg by mouth daily at 6 (six) AM.     . cetirizine (ZYRTEC) 10 MG tablet Take by mouth.    . Cholecalciferol (VITAMIN D3) 5000 units TABS Take 5,000 Units by mouth daily.    Marland Kitchen dexlansoprazole (DEXILANT) 60 MG capsule Take 60 mg by mouth daily.     . famotidine (PEPCID) 40 MG tablet famotidine 40 mg tablet    . famotidine (PEPCID) 40 MG tablet     . hydrOXYzine (VISTARIL) 25 MG capsule TAKE 1 CAPSULE(25 MG) BY MOUTH THREE TIMES DAILY AS NEEDED FOR ANXIETY 90 capsule 3  . influenza vaccine  (FLUCELVAX QUADRIVALENT) 0.5 ML injection Flucelvax Quad 2020-2021 (PF) 60 mcg (15 mcg x 4)/0.5 mL IM syringe    . linaclotide (LINZESS) 72 MCG capsule Take by mouth.    . metoprolol succinate (TOPROL-XL) 25 MG 24 hr tablet TAKE 1 TABLET(25 MG) BY MOUTH DAILY 90 tablet 2  . mirtazapine (REMERON) 15 MG tablet TAKE 1 TABLET(15 MG) BY MOUTH AT BEDTIME FOR SLEEP 30 tablet 3  . Multiple Vitamin (MULTIVITAMIN) tablet Take 1 tablet by mouth daily.    . potassium chloride SA (KLOR-CON) 20 MEQ tablet Take 2 tablets (40 mEq total) by mouth daily. 180 tablet 3  . risedronate (ACTONEL) 35 MG tablet     . triamcinolone cream (KENALOG) 0.1 % triamcinolone acetonide 0.1 % topical cream    . Vilazodone HCl (VIIBRYD) 40 MG TABS DAILY 30 tablet 3   No current facility-administered medications for this visit.     Musculoskeletal: Strength & Muscle Tone: UTA Gait & Station: normal Patient leans: N/A  Psychiatric Specialty Exam: Review of Systems  Constitutional: Positive for fatigue.  Neurological: Positive for headaches.  Psychiatric/Behavioral: Negative for agitation, behavioral problems, confusion, decreased concentration, dysphoric mood, hallucinations, self-injury, sleep disturbance and suicidal ideas. The patient is not nervous/anxious and is not hyperactive.   All other systems reviewed and are negative.   Last menstrual period 09/30/1991.There is no height or weight on file to calculate BMI.  General Appearance: Casual  Eye Contact:  Fair  Speech:  Normal Rate  Volume:  Normal  Mood:  Euthymic  Affect:  Congruent  Thought Process:  Goal Directed and Descriptions of Associations: Intact  Orientation:  Full (Time, Place, and Person)  Thought Content: Logical   Suicidal Thoughts:  No  Homicidal Thoughts:  No  Memory:  Immediate;   Fair Recent;   Fair Remote;   Fair  Judgement:  Fair  Insight:  Fair  Psychomotor Activity:  Normal  Concentration:  Concentration: Fair and Attention Span:  Fair  Recall:  [employer] of Knowledge: Fair  Language: Fair  Akathisia:  No  Handed:  Right  AIMS (if indicated):UTA  Assets:  Communication Skills Desire for Improvement Housing Social Support  ADL's:  Intact  Cognition: WNL  Sleep:  Fair   Screenings: PHQ2-9     Procedure visit from 07/24/2017 in [place] Office Visit from 07/16/2017 in [place] Procedure visit from 06/28/2017 in [place] Office Visit from 03/21/2017 in [place] Procedure visit from 02/20/2017 in [place]  PHQ-2 Total Score  0  0  0  0  0       Assessment and Plan: Alsion is a 64 year old Caucasian female, widowed, lives in [place], has a history of depression, anxiety, chronic pain, insomnia, SVT was evaluated by telemedicine today.  Patient is currently stable on current medication regimen.  Plan as noted below.  Plan Depression in remission Viibryd 40 mg p.o. daily Abilify 2 mg p.o. daily  Anxiety disorder-stable Viibryd as prescribed Hydroxyzine 25 mg p.o. 3 times daily as needed Continue CBT .  Insomnia-stable Mirtazapine 15 mg p.o. nightly.  Follow-up in clinic in 2 to 3 months or sooner if needed.  I have spent atleast 20 minutes non face to face with patient today. More than 50 % of the time was spent for preparing to see the patient ( e.g., review of test, records ), ordering medications and test ,psychoeducation and supportive psychotherapy and care coordination,as well as documenting clinical information in electronic health record.This note was generated in part or whole with voice recognition software. Voice recognition is usually quite accurate but there are transcription errors that can and very often do occur. I apologize for any typographical errors that were not  detected and corrected.       Ursula Alert, MD 09/15/2019, 4:19 PM

## 2019-09-15 NOTE — Patient Instructions (Signed)
Follow-up in clinic on July 9 at 11:20 AM

## 2019-11-06 ENCOUNTER — Other Ambulatory Visit: Payer: Self-pay | Admitting: Psychiatry

## 2019-11-06 DIAGNOSIS — F331 Major depressive disorder, recurrent, moderate: Secondary | ICD-10-CM

## 2019-11-11 ENCOUNTER — Other Ambulatory Visit: Payer: Self-pay | Admitting: Psychiatry

## 2019-11-11 DIAGNOSIS — F411 Generalized anxiety disorder: Secondary | ICD-10-CM

## 2019-11-24 ENCOUNTER — Other Ambulatory Visit: Payer: Self-pay | Admitting: Psychiatry

## 2019-11-24 ENCOUNTER — Telehealth: Payer: Self-pay

## 2019-11-24 DIAGNOSIS — Z634 Disappearance and death of family member: Secondary | ICD-10-CM

## 2019-11-24 DIAGNOSIS — F4010 Social phobia, unspecified: Secondary | ICD-10-CM

## 2019-11-24 DIAGNOSIS — F3342 Major depressive disorder, recurrent, in full remission: Secondary | ICD-10-CM

## 2019-11-24 DIAGNOSIS — F411 Generalized anxiety disorder: Secondary | ICD-10-CM

## 2019-11-24 MED ORDER — ARIPIPRAZOLE 5 MG PO TABS: 5.0000 mg | ORAL_TABLET | Freq: Every day | ORAL | 1 refills | Status: DC

## 2019-11-24 MED ORDER — LORAZEPAM 1 MG PO TABS: 0.5000 mg | ORAL_TABLET | ORAL | 1 refills | Status: DC

## 2019-11-24 NOTE — Telephone Encounter (Signed)
pt called states that her daughter passed away a week ago and she can not handle it.  pt is crying and states she just hurting and that she also states she can not sleep.  Spoke with Lea to see if you or christina had any opening and no one does today but tomorrow at 8 christina had a 8:00 opening. Pt was told that a message would be sent to dr. Shea Evans but I was transferring her to lea to make that appt at.   8:00

## 2019-11-24 NOTE — Telephone Encounter (Signed)
Returned call to patient.  Patient lost her daughter .  Provided grief counseling.  Will increase Abilify to 5 mg p.o. daily Start lorazepam 0.5 mg during the day as needed for severe anxiety attacks and at night for racing thoughts and sleep and anxiety symptoms.  Patient advised to limit use.  Provided education about the use of benzodiazepines.  She will limit use.  Patient will start seeing a therapist and has an appointment tomorrow morning.

## 2019-11-25 ENCOUNTER — Ambulatory Visit (INDEPENDENT_AMBULATORY_CARE_PROVIDER_SITE_OTHER): Payer: Medicare HMO | Admitting: Licensed Clinical Social Worker

## 2019-11-25 ENCOUNTER — Other Ambulatory Visit: Payer: Self-pay

## 2019-11-25 DIAGNOSIS — F331 Major depressive disorder, recurrent, moderate: Secondary | ICD-10-CM | POA: Diagnosis not present

## 2019-11-25 DIAGNOSIS — F411 Generalized anxiety disorder: Secondary | ICD-10-CM

## 2019-11-25 NOTE — Patient Instructions (Signed)
Managing Loss, Adult People experience loss in many different ways throughout their lives. Events such as moving, changing jobs, and losing friends can create a sense of loss. The loss may be as serious as a major health change, divorce, death of a pet, or death of a loved one. All of these types of loss are likely to create a physical and emotional reaction known as grief. Grief is the result of a major change or an absence of something or someone that you count on. Grief is a normal reaction to loss. A variety of factors can affect your grieving experience, including:  The nature of your loss.  Your relationship to what or whom you lost.  Your understanding of grief and how to manage it.  Your support system. How to manage lifestyle changes Keep to your normal routine as much as possible.  If you have trouble focusing or doing normal activities, it is acceptable to take some time away from your normal routine.  Spend time with friends and loved ones.  Eat a healthy diet, get plenty of sleep, and rest when you feel tired. How to recognize changes  The way that you deal with your grief will affect your ability to function as you normally do. When grieving, you may experience these changes:  Numbness, shock, sadness, anxiety, anger, denial, and guilt.  Thoughts about death.  Unexpected crying.  A physical sensation of emptiness in your stomach.  Problems sleeping and eating.  Tiredness (fatigue).  Loss of interest in normal activities.  Dreaming about or imagining seeing the person who died.  A need to remember what or whom you lost.  Difficulty thinking about anything other than your loss for a period of time.  Relief. If you have been expecting the loss for a while, you may feel a sense of relief when it happens. Follow these instructions at home:  Activity Express your feelings in healthy ways, such as:  Talking with others about your loss. It may be helpful to find  others who have had a similar loss, such as a support group.  Writing down your feelings in a journal.  Doing physical activities to release stress and emotional energy.  Doing creative activities like painting, sculpting, or playing or listening to music.  Practicing resilience. This is the ability to recover and adjust after facing challenges. Reading some resources that encourage resilience may help you to learn ways to practice those behaviors. General instructions  Be patient with yourself and others. Allow the grieving process to happen, and remember that grieving takes time. ? It is likely that you may never feel completely done with some grief. You may find a way to move on while still cherishing memories and feelings about your loss. ? Accepting your loss is a process. It can take months or longer to adjust.  Keep all follow-up visits as told by your health care provider. This is important. Where to find support To get support for managing loss:  Ask your health care provider for help and recommendations, such as grief counseling or therapy.  Think about joining a support group for people who are managing a loss. Where to find more information You can find more information about managing loss from:  American Society of Clinical Oncology: www.cancer.net  American Psychological Association: www.apa.org Contact a health care provider if:  Your grief is extreme and keeps getting worse.  You have ongoing grief that does not improve.  Your body shows symptoms of grief, such   as illness.  You feel depressed, anxious, or lonely. Get help right away if:  You have thoughts about hurting yourself or others. If you ever feel like you may hurt yourself or others, or have thoughts about taking your own life, get help right away. You can go to your nearest emergency department or call:  Your local emergency services (911 in the [place]  A suicide crisis helpline, such as the  National Suicide Prevention Lifeline at 1-800-273-8255. This is open 24 hours a day. Summary  Grief is the result of a major change or an absence of someone or something that you count on. Grief is a normal reaction to loss.  The depth of grief and the period of recovery depend on the type of loss and your ability to adjust to the change and process your feelings.  Processing grief requires patience and a willingness to accept your feelings and talk about your loss with people who are supportive.  It is important to find resources that work for you and to realize that people experience grief differently. There is not one grieving process that works for everyone in the same way.  Be aware that when grief becomes extreme, it can lead to more severe issues like isolation, depression, anxiety, or suicidal thoughts. Talk with your health care provider if you have any of these issues. This information is not intended to replace advice given to you by your health care provider. Make sure you discuss any questions you have with your health care provider. Document Revised: 07/19/2018 Document Reviewed: 09/28/2016 Elsevier Patient Education  2020 Elsevier Inc.  

## 2019-11-25 NOTE — Progress Notes (Signed)
Virtual Visit via Video Note  I connected with Wanda [name] on 11/25/19 at  8:00 AM EDT by a video enabled telemedicine application and verified that I am speaking with the correct person using two identifiers.  Location: Patient: home Provider: [facility]   I discussed the limitations of evaluation and management by telemedicine and the availability of in person appointments. The patient expressed understanding and agreed to proceed.  I discussed the assessment and treatment plan with the patient. The patient was provided an opportunity to ask questions and all were answered. The patient agreed with the plan and demonstrated an understanding of the instructions.   The patient was advised to call back or seek an in-person evaluation if the symptoms worsen or if the condition fails to improve as anticipated.  I provided 30 minutes of non-face-to-face time during this encounter.   Wanda Yanni R Rahi Chandonnet, LCSW    THERAPIST PROGRESS NOTE  Session Time: 30 min  Participation Level: Active  Behavioral Response: NeatAlertAnxious and Depressed  Type of Therapy: Individual Therapy  Treatment Goals addressed: Anxiety and Coping  Interventions: Supportive and Other: grief counseling  Summary: Wanda [name] is a 64 y.o. female who presents with grief associated with recent loss of daughter.  To further complicate grief, cause of death is unknown so family members are awaiting autopsy and toxicology reports.   Allowed pt to explore and express thoughts and feelings surrounding the death of daughter.  Explored pts relationship with daughter, and other family members (daughter's two children--son, 59 and daughter, 62 and husband).   Encouraged pt to focus on overall well being, getting ample rest, focus on stress management, and surrounding self with positive social supports. Encouraged pt to scale back goals to one day at a time.  Suicidal/Homicidal: No  Therapist Response: Wanda [name] is displaying  fluctuating/intermittent progress due to recent death of daughter.  LCSW will continue with grief counseling, stress management.   Plan: Return again in 2 weeks. LCSW assisted with scheduling next OPT appt.  Diagnosis: Axis I: Generalized Anxiety Disorder and Major Depression, Recurrent severe    Axis II: No diagnosis    Wanda [name] Wanda Hollenkamp, LCSW 11/25/2019

## 2019-11-27 ENCOUNTER — Other Ambulatory Visit: Payer: Self-pay | Admitting: Child and Adolescent Psychiatry

## 2019-11-27 DIAGNOSIS — F411 Generalized anxiety disorder: Secondary | ICD-10-CM

## 2019-12-05 ENCOUNTER — Telehealth (INDEPENDENT_AMBULATORY_CARE_PROVIDER_SITE_OTHER): Payer: Medicare HMO | Admitting: Psychiatry

## 2019-12-05 ENCOUNTER — Encounter: Payer: Self-pay | Admitting: Psychiatry

## 2019-12-05 ENCOUNTER — Other Ambulatory Visit: Payer: Self-pay

## 2019-12-05 DIAGNOSIS — F411 Generalized anxiety disorder: Secondary | ICD-10-CM

## 2019-12-05 DIAGNOSIS — Z634 Disappearance and death of family member: Secondary | ICD-10-CM | POA: Diagnosis not present

## 2019-12-05 DIAGNOSIS — F331 Major depressive disorder, recurrent, moderate: Secondary | ICD-10-CM

## 2019-12-05 DIAGNOSIS — F4010 Social phobia, unspecified: Secondary | ICD-10-CM

## 2019-12-05 NOTE — Progress Notes (Signed)
Provider Location : [facility] Patient Location : Home  Virtual Visit via Video Note  I connected with Wanda [name] on 12/05/19 at 11:20 AM EDT by a video enabled telemedicine application and verified that I am speaking with the correct person using two identifiers.   I discussed the limitations of evaluation and management by telemedicine and the availability of in person appointments. The patient expressed understanding and agreed to proceed.   I discussed the assessment and treatment plan with the patient. The patient was provided an opportunity to ask questions and all were answered. The patient agreed with the plan and demonstrated an understanding of the instructions.   The patient was advised to call back or seek an in-person evaluation if the symptoms worsen or if the condition fails to improve as anticipated.  [place] MD OP Progress Note  12/05/2019 1:54 PM Wanda [name]  MRN:  [id number]  Chief Complaint:  Chief Complaint    Follow-up     HPI: Wanda [name] is a 64 year old Caucasian female, widowed, on SSI, lives in [place], has a history of GAD, MDD, SVT, orthostatic hypotension, hyperlipidemia was evaluated by telemedicine today.  Patient today reports she continues to grieve the loss of her daughter who recently passed away from medical reasons.  She was on the phone with her daughter when she had chest pain and passed away.  Patient reports she continues to be in a state of shock.  She reports she often cries thinking about her daughter.  She reports she misses her daughter so much since they used to talk every single day.  Patient became very tearful in session today.  Patient reports she is currently in psychotherapy sessions which are going well.  She reports she has upcoming appointment with her therapist.  She is also looking into grief counseling groups available.  Patient reports sleep is improving.  She reports her current medications as beneficial.  She wants to stay  on these medications.  She denies side effects.  Patient denies any suicidality, homicidality or perceptual disturbances.  Patient denies any other concerns today.  Visit Diagnosis:    ICD-10-CM   1. GAD (generalized anxiety disorder)  F41.1   2. MDD (major depressive disorder), recurrent episode, moderate (HCC)  F33.1   3. Bereavement  Z63.4   4. Social anxiety disorder  F40.10     Past Psychiatric History: I have reviewed past psychiatric history from my progress note on 08/21/2017.  Past trials of Zoloft, Cymbalta, Effexor, Paxil, Prozac, Wellbutrin, Xanax, prazosin, Seroquel, trazodone, doxepin  Past Medical History:  Past Medical History:  Diagnosis Date  . Anxiety   . Borderline diabetes   . BRONCHITIS, ACUTE WITH BRONCHOSPASM 06/25/2010   Qualifier: Diagnosis of  By: Alveta Heimlich MD, Cornelia Copa    . Chronic prescription benzodiazepine use 10/18/2015  . Depression   . GASTROENTERITIS WITHOUT DEHYDRATION 05/14/2009   Qualifier: Diagnosis of  By: Deborra Medina MD, Tanja Port    . GERD (gastroesophageal reflux disease)   . Herpes   . 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 Hyperlipidemia   . Irregular heart beat   . Sinus tachycardia 04/11/2017  . Skin cancer of face 08/01/2018  . 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 Dossie Arbour Serial number: [id number] Model number: [id number]     Past Surgical History:  Procedure Laterality Date  . ABDOMINAL HYSTERECTOMY    . APPENDECTOMY    .  BACK SURGERY     X 3  . BREAST EXCISIONAL BIOPSY Right    surgical bx age 24   . COLONOSCOPY N/A 08/28/2013   Procedure: COLONOSCOPY;  Surgeon: Danie Binder, MD;  Location: [facility];  Service: Endoscopy;  Laterality: N/A;  8:30 AM  . INTRAMEDULLARY (IM) NAIL INTERTROCHANTERIC Left 05/30/2016   Procedure: INTRAMEDULLARY (IM) NAIL INTERTROCHANTRIC;  Surgeon: Thornton Park, MD;  Location: [facility] ORS;  Service:  Orthopedics;  Laterality: Left;  . SHOULDER SURGERY    . Spinal cord stimulator      Family Psychiatric History: I have reviewed family psychiatric history from my progress note on 08/21/2017  Family History:  Family History  Problem Relation Age of Onset  . Diabetes Mother   . COPD Mother   . Heart disease Mother   . Diabetes Father   . COPD Father   . Heart disease Father   . Heart attack Father 57       CABG  . Alcohol abuse Daughter   . Depression Daughter   . Drug abuse Daughter   . Anxiety disorder Daughter   . Tuberculosis Maternal Aunt   . Cancer Paternal Aunt        breast  . Breast cancer Paternal Aunt   . Diabetes Paternal Grandmother   . Diabetes Sister   . Cancer Cousin   . Diabetes Cousin   . Colon cancer Neg Hx   . Sudden Cardiac Death Neg Hx     Social History: Reviewed social history from my progress note on 08/21/2017 Social History   Socioeconomic History  . Marital status: Divorced    Spouse name: Not on file  . Number of children: 2  . Years of education: Not on file  . Highest education level: High school graduate  Occupational History    Comment: disabled  Tobacco Use  . Smoking status: Former Smoker    Packs/day: 0.25    Years: 14.00    Pack years: 3.50    Types: Cigarettes    Quit date: 1992    Years since quitting: 29.5  . Smokeless tobacco: Former Systems developer    Quit date: 08/29/1990  Vaping Use  . Vaping Use: Never used  Substance and Sexual Activity  . Alcohol use: Yes    Comment: occasional; once every 6 months  . Drug use: No  . Sexual activity: Yes    Birth control/protection: Surgical  Other Topics Concern  . Not on file  Social History Narrative  . Not on file   Social Determinants of Health   Financial Resource Strain:   . Difficulty of Paying Living Expenses:   Food Insecurity:   . Worried About Charity fundraiser in the Last Year:   . Arboriculturist in the Last Year:   Transportation Needs:   . Lexicographer (Medical):   Marland Kitchen Lack of Transportation (Non-Medical):   Physical Activity:   . Days of Exercise per Week:   . Minutes of Exercise per Session:   Stress:   . Feeling of Stress :   Social Connections:   . Frequency of Communication with Friends and Family:   . Frequency of Social Gatherings with Friends and Family:   . Attends Religious Services:   . Active Member of Clubs or Organizations:   . Attends Archivist Meetings:   Marland Kitchen Marital Status:     Allergies:  Allergies  Allergen Reactions  . Levofloxacin Hives  REACTION: N \\T \ V  . Mobic [Meloxicam] Other (See Comments)    Calf tightness  . Propoxyphene Hcl     REACTION: N \\T \ V  . Tramadol Other (See Comments)    Severe headache  . Codeine Nausea Only    Slight nausea    Metabolic Disorder Labs: Lab Results  Component Value Date   HGBA1C 5.4 05/29/2016   MPG 108 05/29/2016   No results found for: PROLACTIN Lab Results  Component Value Date   CHOL 198 07/08/2018   TRIG 371 (H) 07/08/2018   HDL 46 07/08/2018   CHOLHDL 4.3 07/08/2018   VLDL 59 (H) 09/29/2016   LDLCALC 78 07/08/2018   LDLCALC 106 (H) 09/29/2016   Lab Results  Component Value Date   TSH 2.560 07/08/2018   TSH 2.050 10/18/2016    Therapeutic Level Labs: No results found for: LITHIUM No results found for: VALPROATE No components found for:  CBMZ  Current Medications: Current Outpatient Medications  Medication Sig Dispense Refill  . ARIPiprazole (ABILIFY) 5 MG tablet TAKE 1 TABLET(5 MG) BY MOUTH DAILY 90 tablet 0  . aspirin EC 81 MG tablet Take 81 mg by mouth daily.    Marland Kitchen atorvastatin (LIPITOR) 40 MG tablet Take 40 mg by mouth daily at 6 (six) AM.     . cetirizine (ZYRTEC) 10 MG tablet Take by mouth.    . Cholecalciferol (VITAMIN D3) 5000 units TABS Take 5,000 Units by mouth daily.    Marland Kitchen dexlansoprazole (DEXILANT) 60 MG capsule Take 60 mg by mouth daily.     . famotidine (PEPCID) 40 MG tablet famotidine 40 mg tablet     . famotidine (PEPCID) 40 MG tablet     . hydrOXYzine (VISTARIL) 25 MG capsule TAKE 1 CAPSULE(25 MG) BY MOUTH THREE TIMES DAILY AS NEEDED FOR ANXIETY 45 capsule 0  . influenza vaccine (FLUCELVAX QUADRIVALENT) 0.5 ML injection Flucelvax Quad 2020-2021 (PF) 60 mcg (15 mcg x 4)/0.5 mL IM syringe    . linaclotide (LINZESS) 72 MCG capsule Take by mouth.    Marland Kitchen LORazepam (ATIVAN) 1 MG tablet Take 0.5 tablets (0.5 mg total) by mouth as directed. Take half tablet ( 0.5 mg) as needed during the day for severe anxiety attacks and half tablet ( 0.5 mg) at bedtime for severe anxiety attacks and sleep. 28 tablet 1  . metoprolol succinate (TOPROL-XL) 25 MG 24 hr tablet TAKE 1 TABLET(25 MG) BY MOUTH DAILY 90 tablet 2  . mirtazapine (REMERON) 15 MG tablet TAKE 1 TABLET(15 MG) BY MOUTH AT BEDTIME FOR SLEEP 30 tablet 3  . Multiple Vitamin (MULTIVITAMIN) tablet Take 1 tablet by mouth daily.    . potassium chloride SA (KLOR-CON) 20 MEQ tablet Take 2 tablets (40 mEq total) by mouth daily. 180 tablet 3  . risedronate (ACTONEL) 35 MG tablet     . triamcinolone cream (KENALOG) 0.1 % triamcinolone acetonide 0.1 % topical cream    . Vilazodone HCl (VIIBRYD) 40 MG TABS DAILY 30 tablet 3   No current facility-administered medications for this visit.     Musculoskeletal: Strength & Muscle Tone: UTA Gait & Station: UTA Patient leans: UTA  Psychiatric Specialty Exam: Review of Systems  Psychiatric/Behavioral: Positive for dysphoric mood.  All other systems reviewed and are negative.   Last menstrual period 09/30/1991.There is no height or weight on file to calculate BMI.  General Appearance: Casual  Eye Contact:  Fair  Speech:  Clear and Coherent  Volume:  Normal  Mood:  Depressed  Affect:  Tearful  Thought Process:  Goal Directed and Descriptions of Associations: Intact  Orientation:  Full (Time, Place, and Person)  Thought Content: Rumination   Suicidal Thoughts:  No  Homicidal Thoughts:  No  Memory:   Immediate;   Fair Recent;   Fair Remote;   Fair  Judgement:  Fair  Insight:  Fair  Psychomotor Activity:  Normal  Concentration:  Concentration: Fair and Attention Span: Fair  Recall:  [employer] of Knowledge: Fair  Language: Fair  Akathisia:  No  Handed:  Right  AIMS (if indicated): UTA  Assets:  Communication Skills Desire for Improvement Housing Social Support  ADL's:  Intact  Cognition: WNL  Sleep:  Improving   Screenings: PHQ2-9     Procedure visit from 07/24/2017 in [facility] Office Visit from 07/16/2017 in [facility] Procedure visit from 06/28/2017 in [facility] Office Visit from 03/21/2017 in [facility] Procedure visit from 02/20/2017 in [facility]  PHQ-2 Total Score 0 0 0 0 0       Assessment and Plan: GYNETH HUBKA is a 64 year old Caucasian female, widowed, lives in [place], has a history of depression, anxiety, chronic pain, insomnia, SVT was evaluated by telemedicine today.  Patient is currently grieving the loss of her daughter who passed away recently.  Patient will continue to benefit from psychotherapy sessions and medications.  Plan as noted below.  Plan Depression-improving Patient is currently grieving.  Patient is currently in psychotherapy sessions and has upcoming appointment with her therapist Ms.Hussami. She however will continue to benefit from the following medications which are beneficial. Viibryd 40 mg p.o. daily Abilify 2 mg p.o. daily  Anxiety disorder-improving Viibryd as prescribed Hydroxyzine 25 mg p.o. 3 times daily as needed   Bereavement-improving Lorazepam as prescribed.  Discussed with patient to limit use.  Insomnia-stable Mirtazapine 15 mg p.o. nightly  Follow-up in clinic in 4 weeks or sooner if needed.  I  have spent atleast 20 minutes non face to face with patient today. More than 50 % of the time was spent for preparing to see the patient ( e.g., review of test, records ), ordering medications and test ,psychoeducation and supportive psychotherapy and care coordination,as well as documenting clinical information in electronic health record. This note was generated in part or whole with voice recognition software. Voice recognition is usually quite accurate but there are transcription errors that can and very often do occur. I apologize for any typographical errors that were not detected and corrected.      Ursula Alert, MD 12/05/2019, 1:54 PM

## 2019-12-07 NOTE — Progress Notes (Signed)
Patient: Wanda [name]  Service Category: E/M  Provider: Gaspar Cola, MD  DOB: [birth year]  DOS: 12/08/2019  Referring Provider: Barbaraann Boys, MD  MRN: [id number]  Setting: Ambulatory outpatient  PCP: Barbaraann Boys, MD  Type: New Patient  Specialty: Interventional Pain Management    Location: Office  Delivery: Face-to-face     Primary Reason(s) for Visit: Encounter for initial evaluation of one or more chronic problems (new to examiner) potentially causing chronic pain, and posing a threat to normal musculoskeletal function. (Level of risk: High) CC: Back Pain (lower)  Post-Procedure Evaluation  Procedure: (07/24/2017) Diagnostic/therapeutic right sided erector spina muscle and quadratus lumborum muscle trigger point injection, no fluoroscopy or IV sedation Pre-procedure pain level: 8/10 Post-procedure: 5/10 (< 50% relief)  Sedation: None.  Effectiveness during initial hour after procedure(Ultra-Short Term Relief): 100 %.  Local anesthetic used: Long-acting (4-6 hours) Effectiveness: Defined as any analgesic benefit obtained secondary to the administration of local anesthetics. This carries significant diagnostic value as to the etiological location, or anatomical origin, of the pain. Duration of benefit is expected to coincide with the duration of the local anesthetic used.  Effectiveness during initial 4-6 hours after procedure(Short-Term Relief): 100 %.  Long-term benefit: Defined as any relief past the pharmacologic duration of the local anesthetics.  Effectiveness past the initial 6 hours after procedure(Long-Term Relief):  (does not remember).  Current benefits: Defined as benefit that persist at this time.   Analgesia:  Back to baseline Function: Back to baseline ROM: Back to baseline  HPI  Wanda [name] is a 64 y.o. year old, female patient, who comes today to see [place] for the first time for an initial evaluation of her chronic pain. 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); Lumbar spondylosis; Chronic lower extremity pain (Left); Lumbar facet arthropathy; Lumbar facet syndrome (Bilateral) (R>L); Chronic lower extremity pain (Secondary source of pain) (Right); Chronic hip pain (Right); Osteoarthritis of hip (Right); Chronic neck pain; Closed fracture of hip ([place]); Orthostatic lightheadedness; Chest pain of uncertain etiology; Chronic pain syndrome; Closed intertrochanteric fracture ([place]); History of chest pain; Compression fracture of L1 lumbar vertebra (old); Pharmacologic therapy; Polypharmacy; Disorder of skeletal system; Problems influencing health status; Sinus tachycardia; Lumbar spondylosis w/o Radiculopathy; Chronic sacroiliac joint pain (Left); Chronic musculoskeletal pain; Trigger point with back pain; Palpitations; Essential hypertension; Skin cancer of face; Prediabetes; Overweight; History of hip fracture; GAD (generalized anxiety disorder); MDD (major depressive disorder), recurrent episode, moderate ([place]); Social anxiety disorder; Other specified anxiety disorders; PTSD (post-traumatic stress disorder); Hx of nonmelanoma skin cancer; Neuroleptic-induced acute dystonia; MDD (major depressive disorder), recurrent, in full remission ([place]); Bereavement; and Spinal cord stimulator dysfunction ([place]) on their problem list. Today she comes in for evaluation of her Back Pain (lower)  Pain Assessment: Location: Lower Back Radiating: denies Onset: More than a month ago Duration: Chronic pain Quality: Sharp, Aching Severity: 3 /10  (subjective, self-reported pain score)  Note: Reported level is compatible with observation.                         When using our objective Pain Scale, levels between 6 and 10/10 are said to belong in an emergency room, as it progressively  worsens from a 6/10, described as severely limiting, requiring emergency care not usually available at an outpatient pain management facility. At a 6/10 level, communication becomes difficult and requires great effort. Assistance to reach the emergency department may be required. Facial flushing and profuse sweating along with potentially dangerous increases in heart rate and blood pressure will be evident. Timing: Intermittent Modifying factors: nothing BP: (!) 131/99  HR: (!) 105  Onset and Duration: Present longer than 3 months Cause of pain: Unknown Severity: No change since onset, NAS-11 at its worse: 3/10, NAS-11 at its best: 3/10, NAS-11 now: 3/10 and NAS-11 on the average: 3/10 Timing: Not influenced by the time of the day and After a period of immobility Aggravating Factors: Bending, Lifiting, Prolonged sitting, Prolonged standing and Twisting Alleviating Factors: Cold packs, Hot packs, Medications, Resting and Walking Associated Problems: Fatigue, Inability to concentrate, Sweating and Pain that wakes patient up Quality of Pain: Intermittent, Dull, Throbbing and Tingling Previous Examinations or Tests: Bone scan, Neurological evaluation and Neurosurgical evaluation Previous Treatments: Epidural steroid injections, Facet blocks, Pool exercises, Spinal cord stimulator and Trigger point injections  The patient comes into the clinics today for the first time for a chronic pain management evaluation.  The patient has a lumbar spinal cord stimulator that I implanted on 12/13/2012.  She indicates that its not working and she will like to have it removed.  According to the patient the device quit working for her last year around when Covid 19 restrictions  started.  She has noticed that she has not really felt a significant difference between using it and not using and therefore she feels that she rather have it taken out since most of the discomfort that she feels is in the area of the battery.  On 2018 she broke her left hip/femur and the only discomfort that she feels in the lower extremities is from that prior surgery.  Other than that she indicates that she is currently controlling her pain with ibuprofen, ice, cold, etc.  I asked the patient if she may be interested in a revision of the device so that we can attempt to fix it but she indicated that she is not interested in that she simply wants that.  I will be referring her to the neurosurgeon for removal of the percutaneous electrode and battery.  Today I have explained to the patient that since the last time that I saw her, I have moved away from personally doing implants in the OR.  I have also explained to her that nowadays what we do is we send them to Dr. Deetta Perla for operative management.  Today I will be sending him a referral to see if he would be so kind as to remove the device for [place].  We will be more than happy to continue managing her pain after that.  Historic Controlled Substance Pharmacotherapy Review  PMP and historical list of controlled substances: Lorazepam 1 mg, 1 tablet p.o. daily (filled on 11/24/2019) Current opioid analgesics:  None MME/day: 0 mg/day   Historical Monitoring: The patient  reports no history of drug use. List of all UDS Test(s): No results found. List of other Serum/Urine Drug Screening Test(s):  No results found. Historical Background Evaluation: [place] PMP: PDMP reviewed during this encounter. Online review of the past 53-monthperiod conducted.             PMP NARX Score Report:  Narcotic: 070 Sedative: 131 Stimulant: 000 [place] Department of public safety, offender  search: Editor, commissioning Information) Non-contributory Risk Assessment Profile: PMP NARX  Overdose Risk Score: 030  Meds   Current Outpatient Medications:  .  ARIPiprazole (ABILIFY) 5 MG tablet, TAKE 1 TABLET(5 MG) BY MOUTH DAILY, Disp: 90 tablet, Rfl: 0 .  aspirin EC 81 MG tablet, Take 81 mg by mouth daily., Disp: , Rfl:  .  atorvastatin (LIPITOR) 40 MG tablet, Take 40 mg by mouth daily at 6 (six) AM. , Disp: , Rfl:  .  Cholecalciferol (VITAMIN D3) 5000 units TABS, Take 5,000 Units by mouth daily., Disp: , Rfl:  .  famotidine (PEPCID) 40 MG tablet, famotidine 40 mg tablet, Disp: , Rfl:  .  hydrOXYzine (VISTARIL) 25 MG capsule, TAKE 1 CAPSULE(25 MG) BY MOUTH THREE TIMES DAILY AS NEEDED FOR ANXIETY, Disp: 45 capsule, Rfl: 0 .  influenza vaccine (FLUCELVAX QUADRIVALENT) 0.5 ML injection, Flucelvax Quad 2020-2021 (PF) 60 mcg (15 mcg x 4)/0.5 mL IM syringe, Disp: , Rfl:  .  LORazepam (ATIVAN) 1 MG tablet, Take 0.5 tablets (0.5 mg total) by mouth as directed. Take half tablet ( 0.5 mg) as needed during the day for severe anxiety attacks and half tablet ( 0.5 mg) at bedtime for severe anxiety attacks and sleep., Disp: 28 tablet, Rfl: 1 .  metoprolol succinate (TOPROL-XL) 25 MG 24 hr tablet, TAKE 1 TABLET(25 MG) BY MOUTH DAILY, Disp: 90 tablet, Rfl: 2 .  mirtazapine (REMERON) 15 MG tablet, TAKE 1 TABLET(15 MG) BY MOUTH AT BEDTIME FOR SLEEP, Disp: 30 tablet, Rfl: 3 .  Multiple Vitamin (MULTIVITAMIN) tablet, Take 1 tablet by mouth daily., Disp: , Rfl:  .  potassium chloride SA (KLOR-CON) 20 MEQ tablet, Take 2 tablets (40 mEq total) by mouth daily., Disp: 180 tablet, Rfl: 3 .  risedronate (ACTONEL) 35 MG tablet, , Disp: , Rfl:  .  Vilazodone HCl (VIIBRYD) 40 MG TABS, DAILY, Disp: 30 tablet, Rfl: 3 .  cetirizine (ZYRTEC) 10 MG tablet, Take by mouth., Disp: , Rfl:  .  dexlansoprazole (DEXILANT) 60 MG capsule, Take 60 mg by mouth daily. , Disp: , Rfl:  .  linaclotide (LINZESS) 72 MCG capsule, Take by mouth., Disp: , Rfl:   Imaging Review  Cervical Imaging: Cervical DG complete: Results for  orders placed during the hospital encounter of 01/04/17  DG Cervical Spine Complete  Narrative CLINICAL DATA:  Chronic neck pain  EXAM: CERVICAL SPINE - COMPLETE 4+ VIEW  COMPARISON:  None.  FINDINGS: Seven cervical segments are well visualized. Vertebral body height is well maintained. Osteophytic changes are noted from C4-C7. Disc space narrowing at C5-6 and C6-7 is noted. Mild neural foraminal narrowing is seen at C5-6 and C6-7. No soft tissue abnormality is seen.  IMPRESSION: Multilevel degenerative change without acute abnormality.   Electronically Signed By: Inez Catalina M.D. On: 01/04/2017 14:16  Lumbosacral Imaging: Lumbar MR w/wo contrast: Results for orders placed during the hospital encounter of 08/07/12  MR Lumbar Spine W Wo Contrast  Narrative *RADIOLOGY REPORT*  Clinical Data: Increasing low back pain with bilateral hip and leg pain, weakness, and numbness. Increasing hypersensitivity in the left L4-5 nerve root distribution.  Decreased range of motion.  MRI LUMBAR SPINE WITHOUT AND WITH CONTRAST  Technique:  Multiplanar and multiecho pulse sequences of the lumbar spine were obtained without and with intravenous contrast.  Contrast: 67m MULTIHANCE GADOBENATE DIMEGLUMINE 529 MG/ML IV SOLN  Comparison: Lumbar MRIs dated 08/02/2010 and 01/08/2009  Findings: Normal conus tip is at L2.  Normal paraspinal soft tissues.  T11-12 through L2-3:  No significant abnormalities.  Old Schmorl's node in the superior plate of L1, unchanged.  Minimal disc bulging and osteophytes to the right at T12-L1 with no impingement, unchanged.  L3-4:  Tiny central disc bulge with new desiccation of the disc. No neural impingement.  Prior right hemilaminotomy. Slight degenerative changes of the facet joints, right greater than left, minimally progressed.  L4-5:  Chronic marked disc space narrowing.  Tiny central disc bulge and osteophyte touching the ventral aspect of  the thecal sac, unchanged, without neural impingement.  Previous laminotomies.  No neural impingement.  No pathologic enhancement after contrast.  L5-S1:  Previous left laminotomy. Disc bulge lateral to the right neural foramen which appears to have a mass effect upon the right L5 nerve lateral to the neural foramen.  Far lateral osteophyte formation to the left without impingement upon the left L5 nerve. These findings are unchanged. Slight right facet arthritis, unchanged.  IMPRESSION: 1.  Slight progression of mild degenerative facet arthritis at L3-4 on the right. 2.  Slight new degeneration of the L3-4 disc with a tiny central bulge with no neural impingement. 3.  Chronic right far lateral disc bulge at L5-S1 which could affect the right L5 nerve, unchanged since 01/08/2009.   Original Report Authenticated By: Lorriane Shire, M.D.  Lumbar DG Bending views: Results for orders placed during the hospital encounter of 10/18/15  DG Lumbar Spine Complete W/Bend  Narrative CLINICAL DATA:  Right-sided low back pain, fell many years ago, no recent trauma  EXAM: LUMBAR SPINE - COMPLETE WITH BENDING VIEWS  COMPARISON:  MR lumbar spine of 08/07/2012, and lumbar spine plain films of 06/14/2010  FINDINGS: The lumbar vertebrae remain in normal alignment. There is degenerative disc disease most marked at L4-5 which has not changed significantly compared to the lateral lumbar spine film from 2012. A mild compression deformity of L1 appears old with degenerative spurring present. No acute abnormality is seen. Through flexion and extension there is very limited range of motion with no malalignment. Degenerate changes noted involve the facet joints particularly of L4-5 and L5-S1. A neurostimulator battery pack overlies the left buttock with electrodes extending cephalad into the lower thoracic spinal canal.  IMPRESSION: 1. Normal alignment with little change in degenerative disc  disease particularly at L4-5. 2. Old mild compression deformity of L1. 3. Very limited range of motion through flexion and extension.   Electronically Signed By: Ivar Drape M.D. On: 10/19/2015 08:30         Hip Imaging: Hip-R DG 2-3 views: Results for orders placed during the hospital encounter of 10/18/15  DG HIP UNILAT W OR W/O PELVIS 2-3 VIEWS RIGHT  Narrative CLINICAL DATA:  Low back pain, right greater than left, no acute trauma  EXAM: DG HIP (WITH OR WITHOUT PELVIS) 2-3V RIGHT  COMPARISON:  Pelvis and hip films of 08/07/2012  FINDINGS: There are degenerative changes in both hips right greater than left with slightly more loss of joint space on the right and spurring. No acute fracture is seen. The pelvic rami are intact. The SI joints are corticated.  IMPRESSION: Degenerative joint disease of the hips, right greater than left. No acute abnormality.   Electronically Signed By: Ivar Drape M.D. On: 10/19/2015 08:32  Hip-L DG 2-3 views: Results for orders placed during the hospital encounter of 05/29/16  DG Hip Unilat W or Wo Pelvis 2-3 Views Left  Narrative CLINICAL DATA:  Fall with left hip pain  EXAM: DG HIP (WITH OR WITHOUT PELVIS)  2-3V LEFT  COMPARISON:  10/18/2015  FINDINGS: Left lower quadrant generator again noted. The right femoral head appears normally positioned and there are degenerative changes of the right hip as before.  Pubic symphysis appears intact. Mildly comminuted intertrochanteric fracture of the proximal left femur are with mild varus angulation. Left femoral head projects in joint.  IMPRESSION: Acute mildly angulated left intertrochanteric fracture.   Electronically Signed By: [name] M.D. On: 05/29/2016 03:35  Foot Imaging: Foot-R DG Complete: Results for orders placed during the hospital encounter of 02/02/18  DG Foot Complete Right  Narrative CLINICAL DATA:  64 year old female with a history of right foot  pain  EXAM: RIGHT FOOT COMPLETE - 3+ VIEW  COMPARISON:  None.  FINDINGS: Hallux valgus deformity. No acute fracture identified. No radiopaque foreign body. Questionable soft tissue swelling involving the lateral foot at the base of the fifth toe. No subluxation/dislocation.  No erosive changes of the bone. Degenerative changes of the interphalangeal joints. Degenerative changes of the hindfoot  IMPRESSION: Negative for acute bony abnormality.  Questionable soft tissue swelling at the base of the fifth toe.   Electronically Signed By: Corrie Mckusick D.O. On: 02/02/2018 10:30  Wrist Imaging: Wrist-L DG Complete: Results for orders placed during the hospital encounter of 01/29/16  DG Wrist Complete Left  Narrative CLINICAL DATA:  Fall on outstretched left hand  EXAM: LEFT WRIST - COMPLETE 3+ VIEW  COMPARISON:  None.  FINDINGS: Mild cortical irregularity involving the radial styloid on the oblique view. However, this appears intact on the frontal radiograph. Correlate for point tenderness.  Degenerative changes at the 1st carpometacarpal joint.  Visualized soft tissues are within normal limits.  IMPRESSION: Mild cortical irregularity involving the radial styloid, although only visualized on a single view. Correlate for point tenderness to exclude subtle nondisplaced fracture. In lung in   Electronically Signed By: Julian Hy M.D. On: 01/29/2016 13:16  Complexity Note: Imaging results reviewed. Results shared with Ms. Mentor, using Layman's terms.                        ROS  Cardiovascular: Heart trouble, Daily Aspirin intake and Blood thinners:  Anticoagulant Pulmonary or Respiratory: No reported pulmonary signs or symptoms such as wheezing and difficulty taking a deep full breath (Asthma), difficulty blowing air out (Emphysema), coughing up mucus (Bronchitis), persistent dry cough, or temporary stoppage of breathing during sleep Neurological: No  reported neurological signs or symptoms such as seizures, abnormal skin sensations, urinary and/or fecal incontinence, being born with an abnormal open spine and/or a tethered spinal cord Psychological-Psychiatric: Depressed Gastrointestinal: Reflux or heatburn and Alternating episodes iof diarrhea and constipation (IBS-Irritable bowe syndrome) Genitourinary: No reported renal or genitourinary signs or symptoms such as difficulty voiding or producing urine, peeing blood, non-functioning kidney, kidney stones, difficulty emptying the bladder, difficulty controlling the flow of urine, or chronic kidney disease Hematological: No reported hematological signs or symptoms such as prolonged bleeding, low or poor functioning platelets, bruising or bleeding easily, hereditary bleeding problems, low energy levels due to low hemoglobin or being anemic  Endocrine: No reported endocrine signs or symptoms such as high or low blood sugar, rapid heart rate due to high thyroid levels, obesity or weight gain due to slow thyroid or thyroid disease Rheumatologic: No reported rheumatological signs and symptoms such as fatigue, joint pain, tenderness, swelling, redness, heat, stiffness, decreased range of motion, with or without associated rash Musculoskeletal: Negative for myasthenia gravis, muscular dystrophy, multiple  sclerosis or malignant hyperthermia Work History: Disabled  Allergies  Ms. Gonterman is allergic to levofloxacin, mobic [meloxicam], propoxyphene hcl, tramadol, and codeine.  Laboratory Chemistry Profile   Renal Lab Results  Component Value Date   BUN 9 07/09/2019   CREATININE 0.67 07/09/2019   BCR 13 07/09/2019   GFRAA 108 07/09/2019   GFRNONAA 94 07/09/2019   PROTEINUR NEGATIVE 06/03/2016     Electrolytes Lab Results  Component Value Date   NA 141 07/09/2019   K 3.7 07/09/2019   CL 105 07/09/2019   CALCIUM 9.5 07/09/2019   MG 2.2 02/06/2017     Hepatic Lab Results  Component Value  Date   AST 31 07/08/2018   ALT 24 07/08/2018   ALBUMIN 4.3 07/08/2018   ALKPHOS 82 07/08/2018     ID Lab Results  Component Value Date   STAPHAUREUS POSITIVE (A) 05/30/2016   MRSAPCR NEGATIVE 05/30/2016     Bone Lab Results  Component Value Date   25OHVITD1 46 07/16/2017   25OHVITD2 <1.0 07/16/2017   25OHVITD3 46 07/16/2017     Endocrine Lab Results  Component Value Date   GLUCOSE 96 07/09/2019   GLUCOSEU NEGATIVE 06/03/2016   HGBA1C 5.4 05/29/2016   TSH 2.560 07/08/2018   FREET4 1.39 10/18/2016   CRTSLPL 10.8 06/01/2016     Neuropathy Lab Results  Component Value Date   HGBA1C 5.4 05/29/2016     CNS No results found.   Inflammation (CRP: Acute  ESR: Chronic) Lab Results  Component Value Date   CRP 1.7 07/16/2017   ESRSEDRATE 5 07/16/2017     Rheumatology No results found.   Coagulation Lab Results  Component Value Date   PLT 236 07/08/2018     Cardiovascular Lab Results  Component Value Date   TROPONINI <0.03 05/29/2016   HGB 13.7 07/08/2018   HCT 39.1 07/08/2018     Screening Lab Results  Component Value Date   STAPHAUREUS POSITIVE (A) 05/30/2016   MRSAPCR NEGATIVE 05/30/2016     Cancer No results found.   Allergens No results found.     Note: Lab results reviewed.  PFSH  Drug: Ms. Cannell  reports no history of drug use. Alcohol:  reports current alcohol use. Tobacco:  reports that she quit smoking about 29 years ago. Her smoking use included cigarettes. She has a 3.50 pack-year smoking history. She quit smokeless tobacco use about 29 years ago. 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 Family: family history includes Alcohol abuse in her daughter; Anxiety disorder in her daughter; Breast cancer in her paternal aunt; COPD in her father and mother; Cancer in her cousin and paternal aunt; Depression in her daughter; Diabetes in her cousin, father, mother, paternal grandmother, and sister; Drug abuse in her daughter; Heart attack (age of onset: 12) in her father; Heart disease in her father and mother; Tuberculosis in her maternal aunt.  Past Surgical History:  Procedure Laterality Date  . ABDOMINAL HYSTERECTOMY    . APPENDECTOMY    . BACK SURGERY     X 3  . BREAST EXCISIONAL BIOPSY Right    surgical bx age 23   . COLONOSCOPY N/A 08/28/2013  Procedure: COLONOSCOPY;  Surgeon: Danie Binder, MD;  Location: [facility];  Service: Endoscopy;  Laterality: N/A;  8:30 AM  . INTRAMEDULLARY (IM) NAIL INTERTROCHANTERIC Left 05/30/2016   Procedure: INTRAMEDULLARY (IM) NAIL INTERTROCHANTRIC;  Surgeon: Thornton Park, MD;  Location: [facility]S;  Service: Orthopedics;  Laterality: Left;  . SHOULDER SURGERY    . Spinal cord stimulator     Active Ambulatory Problems    Diagnosis Date Noted  . Mixed hyperlipidemia 06/29/2009  . Hypokalemia 12/11/2008  . Anxiety and depression 12/11/2008  . Common migraine 12/11/2008  . SVT (supraventricular tachycardia) ([place]) 12/11/2008  . Allergic rhinitis 12/11/2008  . Gastroesophageal reflux disease 12/11/2008  . Chronic constipation 12/03/2009  . Generalized osteoarthrosis of hand 12/11/2008  . Osteoporosis 12/11/2008  . Osteopenia 08/17/2009  . Mixed urinary incontinence 12/11/2008  . History of nephrolithiasis 12/11/2008  . Chronic prescription benzodiazepine use 10/18/2015  . Chronic low back pain (Primary Source of Pain) (Bilateral) (R>L) 10/18/2015  . 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 Lumbar spondylosis 10/18/2015  . Chronic lower extremity pain (Left) 10/18/2015  . Lumbar facet arthropathy 10/18/2015  . Lumbar facet syndrome (Bilateral) (R>L) 10/18/2015  . Chronic lower extremity pain (Secondary source of pain) (Right) 10/18/2015  . Chronic hip pain (Right) 10/18/2015  . Osteoarthritis of hip (Right) 10/18/2015  . Chronic neck pain 02/02/2016  . Closed fracture of hip ([place]) 05/29/2016  . Orthostatic lightheadedness 08/10/2016  . Chest pain of uncertain etiology 16/02/9603  . Chronic pain syndrome 01/04/2017  . Closed intertrochanteric fracture ([place]) 08/18/2016  . History of chest pain 02/06/2017  . Compression fracture of L1 lumbar vertebra (old) 02/12/2017  . Pharmacologic therapy 02/13/2017  . Polypharmacy 02/13/2017  . Disorder of skeletal system 02/13/2017  . Problems influencing health status 02/13/2017  . Sinus tachycardia 04/11/2017  . Lumbar spondylosis w/o Radiculopathy 07/16/2017  . Chronic sacroiliac joint pain (Left) 07/16/2017  . Chronic musculoskeletal pain 07/23/2017  . Trigger point with back pain 07/23/2017  . Palpitations 04/03/2018  . Essential hypertension 07/08/2018  . Skin cancer of face 08/01/2018  . Prediabetes 10/01/2015  . Overweight 05/30/2017  . History of hip fracture 08/18/2016  . GAD (generalized anxiety disorder) 11/21/2018  . MDD (major depressive disorder), recurrent episode, moderate ([place]) 11/21/2018  . Social anxiety disorder 11/21/2018  . Other specified anxiety disorders 09/02/2015  . PTSD (post-traumatic stress disorder) 01/14/2019  . Hx of nonmelanoma skin cancer 08/01/2018  . 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 MDD (major depressive disorder), recurrent, in full remission ([place]) 09/15/2019  . Bereavement 12/05/2019  . 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 GASTROENTERITIS WITHOUT DEHYDRATION 05/14/2009  .  BRONCHITIS, ACUTE WITH BRONCHOSPASM 06/25/2010   Past Medical History:  Diagnosis Date  . Anxiety   . Borderline diabetes   . Depression   . GERD (gastroesophageal reflux disease)   . Herpes   . 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 Hyperlipidemia   . Irregular heart beat    Constitutional Exam  General appearance: Well nourished, well developed, and well hydrated. In no apparent acute distress Vitals:   12/08/19 1011  BP: (!) 131/99  Pulse: (!) 105  Resp: 16  Temp: 98.1 F (36.7 C)  TempSrc: Temporal  SpO2: 98%  Weight: 142 lb (64.4 kg)  Height: 5' 1" (1.549 m)   BMI Assessment: Estimated body mass index is 26.83 kg/m  as calculated from the following:   Height as of this encounter: 5' 1" (1.549 m).   Weight as of this encounter: 142 lb (64.4 kg).  BMI interpretation table: BMI level Category Range association with higher incidence of chronic pain  <18 kg/m2 Underweight   18.5-24.9 kg/m2 Ideal body weight   25-29.9 kg/m2 Overweight Increased incidence by 20%  30-34.9 kg/m2 Obese (Class I) Increased incidence by 68%  35-39.9 kg/m2 Severe obesity (Class II) Increased incidence by 136%  >40 kg/m2 Extreme obesity (Class III) Increased incidence by 254%   Patient's current BMI Ideal Body weight  Body mass index is 26.83 kg/m. Ideal body weight: 47.8 kg (105 lb 6.1 oz) Adjusted ideal body weight: 54.4 kg (120 lb 0.4 oz)   BMI Readings from Last 4 Encounters:  12/08/19 26.83 kg/m  07/09/19 26.93 kg/m  01/08/19 28.67 kg/m  07/08/18 29.38 kg/m   Wt Readings from Last 4 Encounters:  12/08/19 142 lb (64.4 kg)  07/09/19 142 lb 8 oz (64.6 kg)  01/08/19 151 lb 12 oz (68.8 kg)  07/08/18 155 lb 8 oz (70.5 kg)    Psych/Mental status: Alert, oriented x 3 (person, place, & time)       Eyes: PERLA Respiratory: No evidence of acute respiratory distress  Cervical Spine Exam  Skin & Axial Inspection: No masses, redness, edema, swelling,  or associated skin lesions Alignment: Symmetrical Functional ROM: Unrestricted ROM      Stability: No instability detected Muscle Tone/Strength: Functionally intact. No obvious neuro-muscular anomalies detected. Sensory (Neurological): Unimpaired Palpation: No palpable anomalies              Upper Extremity (UE) Exam    Side: Right upper extremity  Side: Left upper extremity  Skin & Extremity Inspection: Skin color, temperature, and hair growth are WNL. No peripheral edema or cyanosis. No masses, redness, swelling, asymmetry, or associated skin lesions. No contractures.  Skin & Extremity Inspection: Skin color, temperature, and hair growth are WNL. No peripheral edema or cyanosis. No masses, redness, swelling, asymmetry, or associated skin lesions. No contractures.  Functional ROM: Unrestricted ROM          Functional ROM: Unrestricted ROM          Muscle Tone/Strength: Functionally intact. No obvious neuro-muscular anomalies detected.   Muscle Tone/Strength: Functionally intact. No obvious neuro-muscular anomalies detected.  Sensory (Neurological): Unimpaired          Sensory (Neurological): Unimpaired          Palpation: No palpable anomalies              Palpation: No palpable anomalies              Provocative Test(s):  Phalen's test: deferred Tinel's test: deferred Apley's scratch test (touch opposite shoulder):  Action 1 (Across chest): deferred Action 2 (Overhead): deferred Action 3 (LB reach): deferred   Provocative Test(s):  Phalen's test: deferred Tinel's test: deferred Apley's scratch test (touch opposite shoulder):  Action 1 (Across chest): deferred Action 2 (Overhead): deferred Action 3 (LB reach): deferred    Thoracic Spine Area Exam  Skin & Axial Inspection: No masses, redness, or swelling Alignment: Symmetrical Functional ROM: Unrestricted ROM Stability: No instability detected Muscle Tone/Strength: Functionally intact. No obvious neuro-muscular anomalies  detected. Sensory (Neurological): Unimpaired Muscle strength & Tone: No palpable anomalies  Lumbar Exam  Skin & Axial Inspection: No masses, redness, or swelling Alignment: Symmetrical Functional ROM: Unrestricted ROM       Stability: No instability detected Muscle  Tone/Strength: Functionally intact. No obvious neuro-muscular anomalies detected. Sensory (Neurological): Unimpaired Palpation: No palpable anomalies       Provocative Tests: Hyperextension/rotation test: deferred today       Lumbar quadrant test (Kemp's test): deferred today       Lateral bending test: deferred today       Patrick's Maneuver: deferred today                   FABER* test: deferred today                   S-I anterior distraction/compression test: deferred today         S-I lateral compression test: deferred today         S-I Thigh-thrust test: deferred today         S-I Gaenslen's test: deferred today         *(Flexion, ABduction and External Rotation)  Gait & Posture Assessment  Ambulation: Unassisted Gait: Relatively normal for age and body habitus Posture: WNL   Lower Extremity Exam    Side: Right lower extremity  Side: Left lower extremity  Stability: No instability observed          Stability: No instability observed          Skin & Extremity Inspection: Skin color, temperature, and hair growth are WNL. No peripheral edema or cyanosis. No masses, redness, swelling, asymmetry, or associated skin lesions. No contractures.  Skin & Extremity Inspection: Skin color, temperature, and hair growth are WNL. No peripheral edema or cyanosis. No masses, redness, swelling, asymmetry, or associated skin lesions. No contractures.  Functional ROM: Unrestricted ROM                  Functional ROM: Unrestricted ROM                  Muscle Tone/Strength: Functionally intact. No obvious neuro-muscular anomalies detected.  Muscle Tone/Strength: Functionally intact. No obvious neuro-muscular anomalies detected.   Sensory (Neurological): Unimpaired        Sensory (Neurological): Unimpaired        DTR: Patellar: deferred today Achilles: deferred today Plantar: deferred today  DTR: Patellar: deferred today Achilles: deferred today Plantar: deferred today  Palpation: No palpable anomalies  Palpation: No palpable anomalies   Assessment  Primary Diagnosis & Pertinent Problem List: The primary encounter diagnosis was Chronic pain syndrome. Diagnoses of Chronic low back pain (Primary Source of Pain) (Bilateral) (R>L), Chronic lower extremity pain (Secondary source of pain) (Right), Pharmacologic therapy, Disorder of skeletal system, Problems influencing health status, and Malfunction of spinal cord stimulator, initial encounter [facility]) were also pertinent to this visit.  Visit Diagnosis (New problems to examiner): 1. Chronic pain syndrome   2. Chronic low back pain (Primary Source of Pain) (Bilateral) (R>L)   3. Chronic lower extremity pain (Secondary source of pain) (Right)   4. Pharmacologic therapy   5. Disorder of skeletal system   6. Problems influencing health status   7. Malfunction of spinal cord stimulator, initial encounter [facility])    Plan of Care (Initial workup plan)  Note: Ms. Nored was reminded that as per protocol, today's visit has been an evaluation only. We have not taken over the patient's controlled substance management.  Problem-specific plan: No problem-specific Assessment & Plan notes found for this encounter.  Lab Orders  No laboratory test(s) ordered today   Imaging Orders  No imaging studies ordered today    Referral Orders  Ambulatory referral to Neurosurgery Procedure Orders    No procedure(s) ordered today   Pharmacotherapy (current): Medications ordered:  No orders of the defined types were placed in this encounter.  Medications administered during this visit: [name] had no medications administered during this visit.   Pharmacological  management options:  Opioid Analgesics: The patient was informed that there is no guarantee that she would be a candidate for opioid analgesics. The decision will be made following CDC guidelines. This decision will be based on the results of diagnostic studies, as well as Ms. Varas risk profile.   Membrane stabilizer: To be determined at a later time  Muscle relaxant: To be determined at a later time  NSAID: To be determined at a later time  Other analgesic(s): To be determined at a later time   Interventional management options: Ms. Horvath was informed that there is no guarantee that she would be a candidate for interventional therapies. The decision will be based on the results of diagnostic studies, as well as Ms. Marks risk profile.  Procedure(s) under consideration:  Removal of nonfunctional spinal cord stimulator    Provider-requested follow-up: Return if symptoms worsen or fail to improve.  Future Appointments  Date Time Provider [place]  12/09/2019 12:30 PM Hussami, Edith Groleau, LCSW [facility] None  12/30/2019  8:50 AM Ursula Alert, MD [facility] None  05/13/2020 10:30 AM Ralene Bathe, MD [facility] None    Note by: Gaspar Cola, MD Date: 12/08/2019; Time: 4:48 PM

## 2019-12-08 ENCOUNTER — Other Ambulatory Visit: Payer: Self-pay

## 2019-12-08 ENCOUNTER — Ambulatory Visit: Payer: Medicare HMO | Attending: Pain Medicine | Admitting: Pain Medicine

## 2019-12-08 ENCOUNTER — Encounter: Payer: Self-pay | Admitting: Pain Medicine

## 2019-12-08 VITALS — BP 131/99 | HR 105 | Temp 98.1°F | Resp 16 | Ht 61.0 in | Wt 142.0 lb

## 2019-12-08 DIAGNOSIS — M545 Low back pain: Secondary | ICD-10-CM | POA: Insufficient documentation

## 2019-12-08 DIAGNOSIS — Z79899 Other long term (current) drug therapy: Secondary | ICD-10-CM | POA: Diagnosis not present

## 2019-12-08 DIAGNOSIS — T85192A Other mechanical complication of implanted electronic neurostimulator (electrode) of spinal cord, initial encounter: Secondary | ICD-10-CM | POA: Diagnosis present

## 2019-12-08 DIAGNOSIS — G894 Chronic pain syndrome: Secondary | ICD-10-CM | POA: Insufficient documentation

## 2019-12-08 DIAGNOSIS — M899 Disorder of bone, unspecified: Secondary | ICD-10-CM | POA: Insufficient documentation

## 2019-12-08 DIAGNOSIS — G8929 Other chronic pain: Secondary | ICD-10-CM

## 2019-12-08 DIAGNOSIS — M79604 Pain in right leg: Secondary | ICD-10-CM | POA: Diagnosis present

## 2019-12-08 DIAGNOSIS — Z789 Other specified health status: Secondary | ICD-10-CM | POA: Insufficient documentation

## 2019-12-08 NOTE — Progress Notes (Signed)
Safety precautions to be maintained throughout the outpatient stay will include: orient to surroundings, keep bed in low position, maintain call bell within reach at all times, provide assistance with transfer out of bed and ambulation.  

## 2019-12-09 ENCOUNTER — Ambulatory Visit (INDEPENDENT_AMBULATORY_CARE_PROVIDER_SITE_OTHER): Payer: Medicare HMO | Admitting: Licensed Clinical Social Worker

## 2019-12-09 ENCOUNTER — Other Ambulatory Visit: Payer: Self-pay | Admitting: Psychiatry

## 2019-12-09 DIAGNOSIS — F411 Generalized anxiety disorder: Secondary | ICD-10-CM | POA: Diagnosis not present

## 2019-12-09 DIAGNOSIS — Z634 Disappearance and death of family member: Secondary | ICD-10-CM

## 2019-12-09 DIAGNOSIS — F331 Major depressive disorder, recurrent, moderate: Secondary | ICD-10-CM

## 2019-12-09 NOTE — Progress Notes (Signed)
Virtual Visit via Video Note  I connected with Wanda [name] on 12/09/19 at 12:30 PM EDT by a video enabled telemedicine application and verified that I am speaking with the correct person using two identifiers.  Location: Patient: home  Provider: [facility]   I discussed the limitations of evaluation and management by telemedicine and the availability of in person appointments. The patient expressed understanding and agreed to proceed.   The patient was advised to call back or seek an in-person evaluation if the symptoms worsen or if the condition fails to improve as anticipated.  I provided 30 minutes of non-face-to-face time during this encounter.   Bethanie Bloxom R Sharleen Szczesny, LCSW    THERAPIST PROGRESS NOTE  Session Time: 12:30-1:00 pm  Participation Level: Active  Behavioral Response: Neat and Well GroomedAlertDepressed  Type of Therapy: Individual Therapy  Treatment Goals addressed: Coping  Interventions: Supportive and Other: grief counseling  Summary: Wanda [name] is a 64 y.o. female who presents with continuing depression and anxiety symptoms.  Pt feels "caught" because she is still not aware of the overall cause of death for her daughter.  Son-in-law was recently given a copy of death certificate, which simply states "pending" as the cause of death. Pt feels like not knowing is inhibiting her acceptance and closure. Allowed pt to explore and express thoughts and feelings associated with family members and friends that have been to visit recently.   Suicidal/Homicidal: No  Therapist Response: Wanda [name] continues to show enhanced capacity for managing symptoms triggered by grief/loss. Wanda [name] will continue working with clinician to continue assessing psychological impact of loss and overall self management strategies. Encouraged pt to focus on self care and overall wellness.   Plan: Return again in 5 weeks. Continue grief/loss/trauma therapy.  Diagnosis: Axis I: Bereavement,  Generalized Anxiety Disorder and Major Depression, Recurrent severe    Axis II: No diagnosis    Abbagale Goguen Jermeka Schlotterbeck, LCSW 12/09/2019

## 2019-12-11 ENCOUNTER — Telehealth (HOSPITAL_COMMUNITY): Payer: Self-pay

## 2019-12-11 DIAGNOSIS — F411 Generalized anxiety disorder: Secondary | ICD-10-CM

## 2019-12-11 MED ORDER — HYDROXYZINE PAMOATE 25 MG PO CAPS: ORAL_CAPSULE | ORAL | 0 refills | Status: DC

## 2019-12-11 NOTE — Telephone Encounter (Signed)
RX SENT

## 2019-12-11 NOTE — Telephone Encounter (Signed)
This is Dr. Charlcie Cradle patient. Pt called and stated that she's been on a 90 day supply of Hydroxyzine for quite a while. I looked at the notes from her last 2 visits and she's taking it TID but 45 were sent in on 7/1. She stated she's almost out and wanted to know if a 90 day could be sent in. She uses [facility] on [address] in [place]. Please review and advise. Thank you.

## 2019-12-17 ENCOUNTER — Other Ambulatory Visit: Payer: Self-pay | Admitting: Neurosurgery

## 2019-12-30 ENCOUNTER — Other Ambulatory Visit: Payer: Self-pay

## 2019-12-30 ENCOUNTER — Telehealth (INDEPENDENT_AMBULATORY_CARE_PROVIDER_SITE_OTHER): Payer: Medicare HMO | Admitting: Psychiatry

## 2019-12-30 ENCOUNTER — Encounter: Payer: Self-pay | Admitting: Psychiatry

## 2019-12-30 DIAGNOSIS — F411 Generalized anxiety disorder: Secondary | ICD-10-CM

## 2019-12-30 DIAGNOSIS — F3342 Major depressive disorder, recurrent, in full remission: Secondary | ICD-10-CM

## 2019-12-30 DIAGNOSIS — Z634 Disappearance and death of family member: Secondary | ICD-10-CM | POA: Diagnosis not present

## 2019-12-30 DIAGNOSIS — F4010 Social phobia, unspecified: Secondary | ICD-10-CM

## 2019-12-30 MED ORDER — VIIBRYD 40 MG PO TABS: ORAL_TABLET | ORAL | 3 refills | Status: DC

## 2019-12-30 MED ORDER — MIRTAZAPINE 15 MG PO TABS: ORAL_TABLET | ORAL | 3 refills | Status: DC

## 2019-12-30 NOTE — Progress Notes (Signed)
Provider Location : [facility] Patient Location : Home  Virtual Visit via Video Note  I connected with Wanda [name] on 12/30/19 at  8:50 AM EDT by a video enabled telemedicine application and verified that I am speaking with the correct person using two identifiers.   I discussed the limitations of evaluation and management by telemedicine and the availability of in person appointments. The patient expressed understanding and agreed to proceed.  I discussed the assessment and treatment plan with the patient. The patient was provided an opportunity to ask questions and all were answered. The patient agreed with the plan and demonstrated an understanding of the instructions.   The patient was advised to call back or seek an in-person evaluation if the symptoms worsen or if the condition fails to improve as anticipated.   Robinson MD OP Progress Note  12/30/2019 9:23 AM Wanda [name]  MRN:  [id number]  Chief Complaint:  Chief Complaint    Follow-up     HPI: Wanda [name] is a 64 year old Caucasian female, widowed on SSI, lives in [place], has a history of GAD, MDD, SVT, orthostatic hypotension, hyperlipidemia, grief was evaluated by telemedicine today.  Patient today continues to grieve the loss of her daughter who passed away suddenly of medical causes.  She reports she continues to remember the sound of her last breath as she was on phone with her.  Patient became very tearful when she discussed this.  She continues to miss her daughter.  She reports she has been trying to spend time with her friends which helps to some extent.  She reports she is currently in psychotherapy sessions.  She is planning to start grief groups as soon as possible.  She reports sleep is restless on and off however those nights she takes the lorazepam.  That does help.  She is trying to limit the use of lorazepam.  She is compliant on all her medications.  She is happy with her dosages and is not interested in dosage  changes today.  She denies side effects.  She denies any suicidality, homicidality or perceptual disturbances.    Visit Diagnosis:    ICD-10-CM   1. GAD (generalized anxiety disorder)  F41.1 mirtazapine (REMERON) 15 MG tablet    Vilazodone HCl (VIIBRYD) 40 MG TABS   stable  2. MDD (major depressive disorder), recurrent, in full remission ([place])  F33.42 mirtazapine (REMERON) 15 MG tablet    Vilazodone HCl (VIIBRYD) 40 MG TABS  3. Bereavement  Z63.4   4. Social anxiety disorder  F40.10     Past Psychiatric History: I have reviewed past psychiatric history from my progress note on 08/21/2017.  Past trials of Zoloft, Cymbalta, Effexor, Paxil, Prozac, Wellbutrin, Xanax, prazosin, Seroquel, trazodone, doxepin  Past Medical History:  Past Medical History:  Diagnosis Date  . Anxiety   . Borderline diabetes   . BRONCHITIS, ACUTE WITH BRONCHOSPASM 06/25/2010   Qualifier: Diagnosis of  By: Alveta Heimlich MD, Cornelia Copa    . Chronic prescription benzodiazepine use 10/18/2015  . Depression   . GASTROENTERITIS WITHOUT DEHYDRATION 05/14/2009   Qualifier: Diagnosis of  By: Deborra Medina MD, Tanja Port    . GERD (gastroesophageal reflux disease)   . Herpes   . 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 Hyperlipidemia   . Irregular heart beat   . Sinus tachycardia 04/11/2017  . Skin cancer of face 08/01/2018  . 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 Dossie Arbour Serial number: [id number] Model number: [id number]     Past Surgical History:  Procedure Laterality Date  . ABDOMINAL HYSTERECTOMY    . APPENDECTOMY    . BACK SURGERY     X 3  . BREAST EXCISIONAL BIOPSY Right    surgical bx age 16   . COLONOSCOPY N/A 08/28/2013   Procedure: COLONOSCOPY;  Surgeon: Danie Binder, MD;  [facility];  Service: Endoscopy;  Laterality: N/A;  8:30 AM  . INTRAMEDULLARY (IM) NAIL INTERTROCHANTERIC Left 05/30/2016   Procedure:  INTRAMEDULLARY (IM) NAIL INTERTROCHANTRIC;  Surgeon: Thornton Park, MD;  Location: [facility] ORS;  Service: Orthopedics;  Laterality: Left;  . SHOULDER SURGERY    . Spinal cord stimulator      Family Psychiatric History: I have reviewed family psychiatric history from my progress note on 08/21/2017  Family History:  Family History  Problem Relation Age of Onset  . Diabetes Mother   . COPD Mother   . Heart disease Mother   . Diabetes Father   . COPD Father   . Heart disease Father   . Heart attack Father 11       CABG  . Alcohol abuse Daughter   . Depression Daughter   . Drug abuse Daughter   . Anxiety disorder Daughter   . Tuberculosis Maternal Aunt   . Cancer Paternal Aunt        breast  . Breast cancer Paternal Aunt   . Diabetes Paternal Grandmother   . Diabetes Sister   . Cancer Cousin   . Diabetes Cousin   . Colon cancer Neg Hx   . Sudden Cardiac Death Neg Hx     Social History: Reviewed social history from my progress note on 08/21/2017 Social History   Socioeconomic History  . Marital status: Divorced    Spouse name: Not on file  . Number of children: 2  . Years of education: Not on file  . Highest education level: High school graduate  Occupational History    Comment: disabled  Tobacco Use  . Smoking status: Former Smoker    Packs/day: 0.25    Years: 14.00    Pack years: 3.50    Types: Cigarettes    Quit date: 1992    Years since quitting: 29.6  . Smokeless tobacco: Former Systems developer    Quit date: 08/29/1990  Vaping Use  . Vaping Use: Never used  Substance and Sexual Activity  . Alcohol use: Yes    Comment: occasional; once every 6 months  . Drug use: No  . Sexual activity: Yes    Birth control/protection: Surgical  Other Topics Concern  . Not on file  Social History Narrative  . Not on file   Social Determinants of Health   Financial Resource Strain:   . Difficulty of Paying Living Expenses:   Food Insecurity:   . Worried About Charity fundraiser  in the Last Year:   . Arboriculturist in the Last Year:   Transportation Needs:   . Film/video editor (Medical):   Marland Kitchen Lack of Transportation (Non-Medical):   Physical Activity:   . Days of Exercise per Week:   . Minutes of Exercise per Session:   Stress:   . Feeling of Stress :   Social Connections:   . Frequency of Communication with Friends and Family:   . Frequency of Social Gatherings with Friends and Family:   . Attends Religious Services:   . Active  Member of Clubs or Organizations:   . Attends Archivist Meetings:   Marland Kitchen Marital Status:     Allergies:  Allergies  Allergen Reactions  . Levofloxacin Hives    REACTION: N \\T \ V  . Mobic [Meloxicam] Other (See Comments)    Calf tightness  . Tramadol Other (See Comments)    Severe headache  . Codeine Nausea Only    Slight nausea  . Propoxyphene Hcl Rash    REACTION: N \\T \ V    Metabolic Disorder Labs: Lab Results  Component Value Date   HGBA1C 5.4 05/29/2016   MPG 108 05/29/2016   No results found for: PROLACTIN Lab Results  Component Value Date   CHOL 198 07/08/2018   TRIG 371 (H) 07/08/2018   HDL 46 07/08/2018   CHOLHDL 4.3 07/08/2018   VLDL 59 (H) 09/29/2016   LDLCALC 78 07/08/2018   LDLCALC 106 (H) 09/29/2016   Lab Results  Component Value Date   TSH 2.560 07/08/2018   TSH 2.050 10/18/2016    Therapeutic Level Labs: No results found for: LITHIUM No results found for: VALPROATE No components found for:  CBMZ  Current Medications: Current Outpatient Medications  Medication Sig Dispense Refill  . ARIPiprazole (ABILIFY) 5 MG tablet TAKE 1 TABLET(5 MG) BY MOUTH DAILY (Patient taking differently: Take 5 mg by mouth daily. ) 90 tablet 0  . aspirin EC 81 MG tablet Take 81 mg by mouth daily.    Marland Kitchen atorvastatin (LIPITOR) 40 MG tablet Take 40 mg by mouth daily at 6 (six) AM.     . cetirizine (ZYRTEC) 10 MG tablet Take 10 mg by mouth daily.     . Cholecalciferol (VITAMIN D3) 5000 units TABS Take  5,000 Units by mouth daily.    Marland Kitchen dexlansoprazole (DEXILANT) 60 MG capsule Take 60 mg by mouth daily.     . famotidine (PEPCID) 40 MG tablet Take 40 mg by mouth daily.     . hydrOXYzine (VISTARIL) 25 MG capsule TAKE 1 CAPSULE(25 MG) BY MOUTH THREE TIMES DAILY AS NEEDED FOR ANXIETY (Patient taking differently: Take 25 mg by mouth 3 (three) times daily as needed for anxiety. TAKE 1 CAPSULE(25 MG) BY MOUTH THREE TIMES DAILY AS NEEDED FOR ANXIETY) 90 capsule 0  . linaclotide (LINZESS) 72 MCG capsule Take 72 mcg by mouth daily before breakfast.     . LORazepam (ATIVAN) 1 MG tablet Take 0.5 tablets (0.5 mg total) by mouth as directed. Take half tablet ( 0.5 mg) as needed during the day for severe anxiety attacks and half tablet ( 0.5 mg) at bedtime for severe anxiety attacks and sleep. (Patient taking differently: Take 0.5 mg by mouth at bedtime. ) 28 tablet 1  . metoprolol succinate (TOPROL-XL) 25 MG 24 hr tablet TAKE 1 TABLET(25 MG) BY MOUTH DAILY (Patient taking differently: Take 25 mg by mouth daily. ) 90 tablet 2  . mirtazapine (REMERON) 15 MG tablet TAKE 1 TABLET(15 MG) BY MOUTH AT BEDTIME FOR SLEEP 30 tablet 3  . Multiple Vitamin (MULTIVITAMIN) tablet Take 1 tablet by mouth daily.    . potassium chloride SA (KLOR-CON) 20 MEQ tablet Take 2 tablets (40 mEq total) by mouth daily. 180 tablet 3  . risedronate (ACTONEL) 35 MG tablet Take 35 mg by mouth every 7 (seven) days.     . Vilazodone HCl (VIIBRYD) 40 MG TABS DAILY 30 tablet 3   No current facility-administered medications for this visit.     Musculoskeletal: Strength & Muscle Tone: UTA  Gait & Station: normal Patient leans: N/A  Psychiatric Specialty Exam: Review of Systems  Psychiatric/Behavioral: Positive for dysphoric mood.       Grieving   All other systems reviewed and are negative.   Last menstrual period 09/30/1991.There is no height or weight on file to calculate BMI.  General Appearance: Casual  Eye Contact:  Fair  Speech:   Clear and Coherent  Volume:  Normal  Mood:  Dysphoric  Affect:  Tearful  Thought Process:  Goal Directed and Descriptions of Associations: Intact  Orientation:  Full (Time, Place, and Person)  Thought Content: Logical   Suicidal Thoughts:  No  Homicidal Thoughts:  No  Memory:  Immediate;   Fair Recent;   Fair Remote;   Fair  Judgement:  Fair  Insight:  Fair  Psychomotor Activity:  Normal  Concentration:  Concentration: Fair and Attention Span: Fair  Recall:  [employer] of Knowledge: Fair  Language: Fair  Akathisia:  No  Handed:  Right  AIMS (if indicated): UTA  Assets:  Communication Skills Desire for Improvement Housing Social Support  ADL's:  Intact  Cognition: WNL  Sleep:  Fair   Screenings: PHQ2-9     Office Visit from 12/08/2019 in [place] Procedure visit from 07/24/2017 in [facility] Office Visit from 07/16/2017 in [facility] Procedure visit from 06/28/2017 in [place] Office Visit from 03/21/2017 in [facility]  PHQ-2 Total Score 0 0 0 0 0       Assessment and Plan: Wanda [name] is a 64 year old Caucasian female, widowed, lives in [place], has a history of depression, anxiety, grief, chronic pain, insomnia, SVT was evaluated by telemedicine today.  Patient continues to grieve the loss of her daughter who passed away suddenly.  Patient will continue to benefit from grief counseling.  Plan as noted below.  Plan Depression-improving Patient continues to grieve the loss of her daughter. Will not make any medication dosage changes today. Viibryd 40 mg p.o. daily Abilify 2 mg p.o. daily  Anxiety disorder-improving Viibryd as prescribed Hydroxyzine 25 mg p.o. 3 times daily as needed  Bereavement-improving Patient will continue to follow-up  with her therapist Ms.Hussami. She will also try to start grief groups. Lorazepam 1 mg as needed for severe anxiety/agitation or sleep problems.  She has been trying to limit use. I have reviewed [place] controlled substance database.  Insomnia-stable Mirtazapine 15 mg p.o. nightly  Provided supportive psychotherapy/grief counseling in session today.  Follow-up in clinic in 4 weeks or sooner if needed.  I have spent atleast 20 minutes non face to face with patient today. More than 50 % of the time was spent for preparing to see the patient ( e.g., review of test, records ),ordering medications and test ,psychoeducation and supportive psychotherapy and care coordination,as well as documenting clinical information in electronic health record. This note was generated in part or whole with voice recognition software. Voice recognition is usually quite accurate but there are transcription errors that can and very often do occur. I apologize for any typographical errors that were not detected and corrected.      Ursula Alert, MD 12/30/2019, 9:23 AM

## 2020-01-01 ENCOUNTER — Encounter
Admission: RE | Admit: 2020-01-01 | Discharge: 2020-01-01 | Disposition: A | Discharge status: Home / Self Care | Payer: Medicare HMO | Source: Ambulatory Visit | Attending: Neurosurgery | Admitting: Neurosurgery

## 2020-01-01 ENCOUNTER — Other Ambulatory Visit: Payer: Self-pay

## 2020-01-01 DIAGNOSIS — Z01812 Encounter for preprocedural laboratory examination: Secondary | ICD-10-CM | POA: Diagnosis not present

## 2020-01-01 HISTORY — DX: Prediabetes: R73.03

## 2020-01-01 HISTORY — DX: Personal history of urinary calculi: Z87.442

## 2020-01-01 HISTORY — DX: Cardiac arrhythmia, unspecified: I49.9

## 2020-01-01 HISTORY — DX: Essential (primary) hypertension: I10

## 2020-01-01 LAB — CBC
HCT: 39.7 % (ref 36.0–46.0)
Hemoglobin: 13.7 g/dL (ref 12.0–15.0)
MCH: 32.2 pg (ref 26.0–34.0)
MCHC: 34.5 g/dL (ref 30.0–36.0)
MCV: 93.2 fL (ref 80.0–100.0)
Platelets: 229 10*3/uL (ref 150–400)
RBC: 4.26 MIL/uL (ref 3.87–5.11)
RDW: 12.2 % (ref 11.5–15.5)
WBC: 5.6 10*3/uL (ref 4.0–10.5)
nRBC: 0 % (ref 0.0–0.2)

## 2020-01-01 LAB — BASIC METABOLIC PANEL
Anion gap: 8 (ref 5–15)
BUN: 9 mg/dL (ref 8–23)
CO2: 28 mmol/L (ref 22–32)
Calcium: 10 mg/dL (ref 8.9–10.3)
Chloride: 105 mmol/L (ref 98–111)
Creatinine, Ser: 0.8 mg/dL (ref 0.44–1.00)
GFR calc Af Amer: >60 mL/min (ref >60)
GFR calc non Af Amer: >60 mL/min (ref >60)
Glucose, Bld: 112 mg/dL — ABNORMAL HIGH (ref 70–99)
Potassium: 4.1 mmol/L (ref 3.5–5.1)
Sodium: 141 mmol/L (ref 135–145)

## 2020-01-01 LAB — URINALYSIS, ROUTINE W REFLEX MICROSCOPIC
Bilirubin Urine: NEGATIVE
Glucose, UA: NEGATIVE mg/dL
Hgb urine dipstick: NEGATIVE
Ketones, ur: NEGATIVE mg/dL
Leukocytes,Ua: NEGATIVE
Nitrite: NEGATIVE
Protein, ur: NEGATIVE mg/dL
Specific Gravity, Urine: 1.003 — ABNORMAL LOW (ref 1.005–1.030)
pH: 6 (ref 5.0–8.0)

## 2020-01-01 LAB — APTT: aPTT: 30 seconds (ref 24–36)

## 2020-01-01 LAB — PROTIME-INR
INR: 0.9 (ref 0.8–1.2)
Prothrombin Time: 11.8 seconds (ref 11.4–15.2)

## 2020-01-01 LAB — SURGICAL PCR SCREEN
MRSA, PCR: NEGATIVE
Staphylococcus aureus: NEGATIVE

## 2020-01-01 LAB — TYPE AND SCREEN
ABO/RH(D): O POS
Antibody Screen: NEGATIVE

## 2020-01-01 NOTE — Patient Instructions (Signed)
Your procedure is scheduled on: Mon. 8/19 Report to Day Surgery. To find out your arrival time please call [phone] between 1PM - 3PM on Fri. 8/13  Remember: Instructions that are not followed completely may result in serious medical risk,  up to and including death, or upon the discretion of your surgeon and anesthesiologist your  surgery may need to be rescheduled.     _X__ 1. Do not eat food after midnight the night before your procedure.                 No gum chewing or hard candies. You may drink clear liquids up to 2 hours                 before you are scheduled to arrive for your surgery- DO not drink clear                 liquids within 2 hours of the start of your surgery.                 Clear Liquids include:  water, apple juice without pulp, clear Gatorade, G2 or                  Gatorade Zero (avoid Red/Purple/Blue), Black Coffee or Tea (Do not add                 anything to coffee or tea). _____2.   Complete the carbohydrate drink provided to you, 2 hours before arrival.  __X__2.  On the morning of surgery brush your teeth with toothpaste and water, you                may rinse your mouth with mouthwash if you wish.  Do not swallow any toothpaste of mouthwash.     _X__ 3.  No Alcohol for 24 hours before or after surgery.   ___ 4.  Do Not Smoke or use e-cigarettes For 24 Hours Prior to Your Surgery.                 Do not use any chewable tobacco products for at least 6 hours prior to                 Surgery.  ___  5.  Do not use any recreational drugs (marijuana, cocaine, heroin, ecstacy, MDMA or other)                For at least one week prior to your surgery.  Combination of these drugs with anesthesia                May have life threatening results.  ____  6.  Bring all medications with you on the day of surgery if instructed.   _x___  7.  Notify your doctor if there is any change in your medical condition      (cold, fever,  infections).     Do not wear jewelry, make-up, hairpins, clips or nail polish. Do not wear lotions, powders, or perfumes. You may wear deodorant. Do not shave 48 hours prior to surgery. Do not bring valuables to the hospital.    [facility] is not responsible for any belongings or valuables.  Contacts, dentures or bridgework may not be worn into surgery. Leave your suitcase in the car. After surgery it may be brought to your room. For patients admitted to the hospital, discharge time is determined by your treatment team.   Patients discharged the  day of surgery will not be allowed to drive home.   Make arrangements for someone to be with you for the first 24 hours of your Same Day Discharge.    Please read over the following fact sheets that you were given:      __x__ Take these medicines the morning of surgery with A SIP OF WATER:    1. ARIPiprazole (ABILIFY) 5 MG tablet  2. dexlansoprazole (DEXILANT) 60 MG capsule  3. hydrOXYzine (VISTARIL) 25 MG capsule if needed  4.Vilazodone HCl (VIIBRYD) 40 MG TABS  5.  6.  ____ Fleet Enema (as directed)   _x___ Use CHG Soap (or wipes) as directed  ____ Use Benzoyl Peroxide Gel as instructed  ____ Use inhalers on the day of surgery  ____ Stop metformin 2 days prior to surgery    ____ Take 1/2 of usual insulin dose the night before surgery. No insulin the morning          of surgery.   _x___ Stop aspirin on 8/9  __x__ Stop Anti-inflammatories ibuprofen aleve  on 8/9   May take tylenol   ____ Stop supplements until after surgery.    ____ Bring C-Pap to the hospital.

## 2020-01-02 ENCOUNTER — Other Ambulatory Visit: Payer: Self-pay | Admitting: Internal Medicine

## 2020-01-08 ENCOUNTER — Other Ambulatory Visit
Admission: RE | Admit: 2020-01-08 | Discharge: 2020-01-08 | Disposition: A | Discharge status: Home / Self Care | Payer: Medicare HMO | Source: Ambulatory Visit | Attending: Neurosurgery | Admitting: Neurosurgery

## 2020-01-08 ENCOUNTER — Other Ambulatory Visit: Payer: Self-pay

## 2020-01-08 DIAGNOSIS — Z01812 Encounter for preprocedural laboratory examination: Secondary | ICD-10-CM | POA: Diagnosis present

## 2020-01-08 DIAGNOSIS — Z20822 Contact with and (suspected) exposure to covid-19: Secondary | ICD-10-CM | POA: Diagnosis not present

## 2020-01-08 LAB — SARS CORONAVIRUS 2 (TAT 6-24 HRS): SARS Coronavirus 2: NEGATIVE

## 2020-01-09 ENCOUNTER — Other Ambulatory Visit (HOSPITAL_COMMUNITY): Payer: Self-pay | Admitting: Child and Adolescent Psychiatry

## 2020-01-09 DIAGNOSIS — G894 Chronic pain syndrome: Secondary | ICD-10-CM | POA: Diagnosis not present

## 2020-01-09 DIAGNOSIS — Z87891 Personal history of nicotine dependence: Secondary | ICD-10-CM | POA: Diagnosis not present

## 2020-01-09 DIAGNOSIS — F411 Generalized anxiety disorder: Secondary | ICD-10-CM

## 2020-01-09 DIAGNOSIS — T85840A Pain due to nervous system prosthetic devices, implants and grafts, initial encounter: Secondary | ICD-10-CM | POA: Diagnosis not present

## 2020-01-09 DIAGNOSIS — E785 Hyperlipidemia, unspecified: Secondary | ICD-10-CM | POA: Diagnosis not present

## 2020-01-09 DIAGNOSIS — Z7982 Long term (current) use of aspirin: Secondary | ICD-10-CM | POA: Diagnosis not present

## 2020-01-09 DIAGNOSIS — Y831 Surgical operation with implant of artificial internal device as the cause of abnormal reaction of the patient, or of later complication, without mention of misadventure at the time of the procedure: Secondary | ICD-10-CM | POA: Diagnosis not present

## 2020-01-09 DIAGNOSIS — Z85828 Personal history of other malignant neoplasm of skin: Secondary | ICD-10-CM | POA: Diagnosis not present

## 2020-01-09 DIAGNOSIS — I10 Essential (primary) hypertension: Secondary | ICD-10-CM | POA: Diagnosis not present

## 2020-01-09 DIAGNOSIS — Z79899 Other long term (current) drug therapy: Secondary | ICD-10-CM | POA: Diagnosis not present

## 2020-01-09 DIAGNOSIS — F419 Anxiety disorder, unspecified: Secondary | ICD-10-CM | POA: Diagnosis not present

## 2020-01-09 DIAGNOSIS — K219 Gastro-esophageal reflux disease without esophagitis: Secondary | ICD-10-CM | POA: Diagnosis not present

## 2020-01-09 DIAGNOSIS — F329 Major depressive disorder, single episode, unspecified: Secondary | ICD-10-CM | POA: Diagnosis not present

## 2020-01-09 NOTE — Progress Notes (Signed)
Pharmacy consult for Cefazolin  64 yo F 65.3 kg  Crcl 61.5 ml/min  Will order cefazolin 2 gm IV x 1 for surgical prophylaxis. Expected admit date of 01/12/20  Chinita Greenland PharmD Clinical Pharmacist 01/09/2020

## 2020-01-12 ENCOUNTER — Encounter
Admission: RE | Disposition: A | Discharge status: Home / Self Care | Payer: Self-pay | Source: Home / Self Care | Attending: Neurosurgery

## 2020-01-12 ENCOUNTER — Ambulatory Visit: Payer: Medicare HMO | Admitting: Urgent Care

## 2020-01-12 ENCOUNTER — Encounter: Payer: Self-pay | Admitting: Neurosurgery

## 2020-01-12 ENCOUNTER — Ambulatory Visit: Payer: Medicare HMO | Admitting: Anesthesiology

## 2020-01-12 ENCOUNTER — Ambulatory Visit
Admission: RE | Admit: 2020-01-12 | Discharge: 2020-01-12 | Disposition: A | Discharge status: Home / Self Care | Payer: Medicare HMO | Attending: Neurosurgery | Admitting: Neurosurgery

## 2020-01-12 ENCOUNTER — Other Ambulatory Visit: Payer: Self-pay

## 2020-01-12 DIAGNOSIS — T85840A Pain due to nervous system prosthetic devices, implants and grafts, initial encounter: Secondary | ICD-10-CM | POA: Insufficient documentation

## 2020-01-12 DIAGNOSIS — F329 Major depressive disorder, single episode, unspecified: Secondary | ICD-10-CM | POA: Insufficient documentation

## 2020-01-12 DIAGNOSIS — F419 Anxiety disorder, unspecified: Secondary | ICD-10-CM | POA: Insufficient documentation

## 2020-01-12 DIAGNOSIS — Z87891 Personal history of nicotine dependence: Secondary | ICD-10-CM | POA: Insufficient documentation

## 2020-01-12 DIAGNOSIS — G894 Chronic pain syndrome: Secondary | ICD-10-CM | POA: Insufficient documentation

## 2020-01-12 DIAGNOSIS — Z79899 Other long term (current) drug therapy: Secondary | ICD-10-CM | POA: Insufficient documentation

## 2020-01-12 DIAGNOSIS — T85192A Other mechanical complication of implanted electronic neurostimulator (electrode) of spinal cord, initial encounter: Secondary | ICD-10-CM

## 2020-01-12 DIAGNOSIS — K219 Gastro-esophageal reflux disease without esophagitis: Secondary | ICD-10-CM | POA: Insufficient documentation

## 2020-01-12 DIAGNOSIS — Z85828 Personal history of other malignant neoplasm of skin: Secondary | ICD-10-CM | POA: Insufficient documentation

## 2020-01-12 DIAGNOSIS — I10 Essential (primary) hypertension: Secondary | ICD-10-CM | POA: Insufficient documentation

## 2020-01-12 DIAGNOSIS — Z7982 Long term (current) use of aspirin: Secondary | ICD-10-CM | POA: Insufficient documentation

## 2020-01-12 DIAGNOSIS — E785 Hyperlipidemia, unspecified: Secondary | ICD-10-CM | POA: Insufficient documentation

## 2020-01-12 DIAGNOSIS — Y831 Surgical operation with implant of artificial internal device as the cause of abnormal reaction of the patient, or of later complication, without mention of misadventure at the time of the procedure: Secondary | ICD-10-CM | POA: Insufficient documentation

## 2020-01-12 HISTORY — PX: SPINAL CORD STIMULATOR REMOVAL: SHX5379

## 2020-01-12 SURGERY — LUMBAR SPINAL CORD STIMULATOR REMOVAL
Anesthesia: Monitor Anesthesia Care

## 2020-01-12 MED ORDER — NALOXONE HCL 0.4 MG/0.4ML IJ SOAJ: INTRAMUSCULAR | 0 refills | Status: DC

## 2020-01-12 MED ORDER — ACETAMINOPHEN 10 MG/ML IV SOLN
INTRAVENOUS | Status: DC | PRN
  Administered 2020-01-12: 1000 mg via INTRAVENOUS

## 2020-01-12 MED ORDER — ROCURONIUM BROMIDE 10 MG/ML (PF) SYRINGE
PREFILLED_SYRINGE | INTRAVENOUS | Status: AC
  Filled 2020-01-12: qty 10

## 2020-01-12 MED ORDER — DEXAMETHASONE SODIUM PHOSPHATE 10 MG/ML IJ SOLN
INTRAMUSCULAR | Status: AC
  Filled 2020-01-12: qty 2

## 2020-01-12 MED ORDER — CHLORHEXIDINE GLUCONATE 0.12 % MT SOLN
OROMUCOSAL | Status: AC
  Administered 2020-01-12: 15 mL via OROMUCOSAL
  Filled 2020-01-12: qty 15

## 2020-01-12 MED ORDER — ONDANSETRON HCL 4 MG/2ML IJ SOLN
INTRAMUSCULAR | Status: DC | PRN
  Administered 2020-01-12 (×2): 4 mg via INTRAVENOUS

## 2020-01-12 MED ORDER — MIDAZOLAM HCL 2 MG/2ML IJ SOLN
INTRAMUSCULAR | Status: AC
  Filled 2020-01-12: qty 2

## 2020-01-12 MED ORDER — THROMBIN 5000 UNITS EX SOLR
CUTANEOUS | Status: AC
  Filled 2020-01-12: qty 5000

## 2020-01-12 MED ORDER — KETOROLAC TROMETHAMINE 30 MG/ML IJ SOLN
INTRAMUSCULAR | Status: DC | PRN
  Administered 2020-01-12: 30 mg via INTRAVENOUS

## 2020-01-12 MED ORDER — ESMOLOL HCL 100 MG/10ML IV SOLN
INTRAVENOUS | Status: AC
  Filled 2020-01-12: qty 10

## 2020-01-12 MED ORDER — KETOROLAC TROMETHAMINE 30 MG/ML IJ SOLN
INTRAMUSCULAR | Status: AC
  Filled 2020-01-12: qty 1

## 2020-01-12 MED ORDER — FENTANYL CITRATE (PF) 100 MCG/2ML IJ SOLN
INTRAMUSCULAR | Status: AC
  Filled 2020-01-12: qty 2

## 2020-01-12 MED ORDER — LACTATED RINGERS IV SOLN: INTRAVENOUS | Status: DC

## 2020-01-12 MED ORDER — ESMOLOL HCL 100 MG/10ML IV SOLN
INTRAVENOUS | Status: DC | PRN
Start: 2020-01-12 — End: 2020-01-12
  Administered 2020-01-12: 20 mg via INTRAVENOUS

## 2020-01-12 MED ORDER — SUGAMMADEX SODIUM 200 MG/2ML IV SOLN
INTRAVENOUS | Status: DC | PRN
  Administered 2020-01-12: 200 mg via INTRAVENOUS

## 2020-01-12 MED ORDER — ROCURONIUM BROMIDE 100 MG/10ML IV SOLN
INTRAVENOUS | Status: DC | PRN
  Administered 2020-01-12: 40 mg via INTRAVENOUS

## 2020-01-12 MED ORDER — SODIUM CHLORIDE FLUSH 0.9 % IV SOLN
INTRAVENOUS | Status: AC
  Filled 2020-01-12: qty 10

## 2020-01-12 MED ORDER — DEXMEDETOMIDINE HCL IN NACL 200 MCG/50ML IV SOLN
INTRAVENOUS | Status: DC | PRN
Start: 2020-01-12 — End: 2020-01-12
  Administered 2020-01-12: 4 ug via INTRAVENOUS

## 2020-01-12 MED ORDER — FENTANYL CITRATE (PF) 100 MCG/2ML IJ SOLN: 25.0000 ug | INTRAMUSCULAR | Status: DC | PRN

## 2020-01-12 MED ORDER — EPHEDRINE SULFATE 50 MG/ML IJ SOLN
INTRAMUSCULAR | Status: DC | PRN
  Administered 2020-01-12: 5 mg via INTRAVENOUS

## 2020-01-12 MED ORDER — FENTANYL CITRATE (PF) 100 MCG/2ML IJ SOLN
INTRAMUSCULAR | Status: DC | PRN
  Administered 2020-01-12 (×2): 50 ug via INTRAVENOUS

## 2020-01-12 MED ORDER — OXYCODONE HCL 5 MG PO TABS: 5.0000 mg | ORAL_TABLET | Freq: Four times a day (QID) | ORAL | 0 refills | Status: DC | PRN

## 2020-01-12 MED ORDER — OXYCODONE HCL 5 MG PO TABS: 5.0000 mg | ORAL_TABLET | Freq: Four times a day (QID) | ORAL | 0 refills | Status: AC | PRN

## 2020-01-12 MED ORDER — PROPOFOL 10 MG/ML IV BOLUS
INTRAVENOUS | Status: DC | PRN
  Administered 2020-01-12: 120 mg via INTRAVENOUS

## 2020-01-12 MED ORDER — ACETAMINOPHEN 10 MG/ML IV SOLN
INTRAVENOUS | Status: AC
  Filled 2020-01-12: qty 100

## 2020-01-12 MED ORDER — MIDAZOLAM HCL 2 MG/2ML IJ SOLN
INTRAMUSCULAR | Status: DC | PRN
  Administered 2020-01-12: 2 mg via INTRAVENOUS

## 2020-01-12 MED ORDER — GLYCOPYRROLATE 0.2 MG/ML IJ SOLN
INTRAMUSCULAR | Status: DC | PRN
  Administered 2020-01-12: .2 mg via INTRAVENOUS

## 2020-01-12 MED ORDER — EPHEDRINE 5 MG/ML INJ
INTRAVENOUS | Status: AC
  Filled 2020-01-12: qty 10

## 2020-01-12 MED ORDER — DEXAMETHASONE SODIUM PHOSPHATE 10 MG/ML IJ SOLN
INTRAMUSCULAR | Status: DC | PRN
  Administered 2020-01-12: 10 mg via INTRAVENOUS

## 2020-01-12 MED ORDER — BUPIVACAINE-EPINEPHRINE (PF) 0.5% -1:200000 IJ SOLN
INTRAMUSCULAR | Status: AC
  Filled 2020-01-12: qty 30

## 2020-01-12 MED ORDER — SUCCINYLCHOLINE CHLORIDE 200 MG/10ML IV SOSY
PREFILLED_SYRINGE | INTRAVENOUS | Status: AC
  Filled 2020-01-12: qty 10

## 2020-01-12 MED ORDER — ORAL CARE MOUTH RINSE: 15.0000 mL | Freq: Once | OROMUCOSAL | Status: AC

## 2020-01-12 MED ORDER — LIDOCAINE HCL (PF) 2 % IJ SOLN
INTRAMUSCULAR | Status: AC
  Filled 2020-01-12: qty 10

## 2020-01-12 MED ORDER — ONDANSETRON HCL 4 MG/2ML IJ SOLN
INTRAMUSCULAR | Status: AC
  Filled 2020-01-12: qty 8

## 2020-01-12 MED ORDER — LIDOCAINE HCL (CARDIAC) PF 100 MG/5ML IV SOSY
PREFILLED_SYRINGE | INTRAVENOUS | Status: DC | PRN
  Administered 2020-01-12: 80 mg via INTRAVENOUS

## 2020-01-12 MED ORDER — CEFAZOLIN SODIUM-DEXTROSE 2-4 GM/100ML-% IV SOLN
2.0000 g | INTRAVENOUS | Status: AC
  Administered 2020-01-12: 2 g via INTRAVENOUS

## 2020-01-12 MED ORDER — GLYCOPYRROLATE 0.2 MG/ML IJ SOLN
INTRAMUSCULAR | Status: AC
  Filled 2020-01-12: qty 2

## 2020-01-12 MED ORDER — CEFAZOLIN SODIUM-DEXTROSE 2-4 GM/100ML-% IV SOLN
INTRAVENOUS | Status: AC
  Filled 2020-01-12: qty 100

## 2020-01-12 MED ORDER — BUPIVACAINE-EPINEPHRINE (PF) 0.5% -1:200000 IJ SOLN
INTRAMUSCULAR | Status: DC | PRN
  Administered 2020-01-12: 15 mL

## 2020-01-12 MED ORDER — VANCOMYCIN HCL 1000 MG IV SOLR
INTRAVENOUS | Status: AC
  Filled 2020-01-12: qty 1000

## 2020-01-12 MED ORDER — PROPOFOL 500 MG/50ML IV EMUL
INTRAVENOUS | Status: AC
  Filled 2020-01-12: qty 50

## 2020-01-12 MED ORDER — PHENYLEPHRINE HCL (PRESSORS) 10 MG/ML IV SOLN
INTRAVENOUS | Status: AC
  Filled 2020-01-12: qty 1

## 2020-01-12 MED ORDER — CHLORHEXIDINE GLUCONATE 0.12 % MT SOLN: 15.0000 mL | Freq: Once | OROMUCOSAL | Status: AC

## 2020-01-12 MED ORDER — DEXMEDETOMIDINE HCL IN NACL 80 MCG/20ML IV SOLN
INTRAVENOUS | Status: AC
  Filled 2020-01-12: qty 20

## 2020-01-12 MED ORDER — SUCCINYLCHOLINE CHLORIDE 20 MG/ML IJ SOLN
INTRAMUSCULAR | Status: DC | PRN
  Administered 2020-01-12: 100 mg via INTRAVENOUS

## 2020-01-12 SURGICAL SUPPLY — 70 items
ADH SKN CLS APL DERMABOND .7 (GAUZE/BANDAGES/DRESSINGS) ×1
AGENT HMST MTR 8 SURGIFLO (HEMOSTASIS)
APL PRP STRL LF DISP 70% ISPRP (MISCELLANEOUS) ×1
APL SRG 60D 8 XTD TIP BNDBL (TIP)
BLADE BOVIE TIP EXT 4 (BLADE) ×2 IMPLANT
BUR NEURO DRILL SOFT 3.0X3.8M (BURR) ×2 IMPLANT
CANISTER SUCT 1200ML W/VALVE (MISCELLANEOUS) ×4 IMPLANT
CHLORAPREP W/TINT 26 (MISCELLANEOUS) ×2 IMPLANT
CNTNR SPEC 2.5X3XGRAD LEK (MISCELLANEOUS)
CONT SPEC 4OZ STER OR WHT (MISCELLANEOUS)
CONT SPEC 4OZ STRL OR WHT (MISCELLANEOUS)
CONTAINER SPEC 2.5X3XGRAD LEK (MISCELLANEOUS) ×1 IMPLANT
COUNTER NEEDLE 20/40 LG (NEEDLE) ×2 IMPLANT
COVER LIGHT HANDLE STERIS (MISCELLANEOUS) ×4 IMPLANT
COVER WAND RF STERILE (DRAPES) ×2 IMPLANT
CUP MEDICINE 2OZ PLAST GRAD ST (MISCELLANEOUS) ×2 IMPLANT
DERMABOND ADVANCED (GAUZE/BANDAGES/DRESSINGS) ×1
DERMABOND ADVANCED .7 DNX12 (GAUZE/BANDAGES/DRESSINGS) ×1 IMPLANT
DRAPE C-ARM XRAY 36X54 (DRAPES) ×4 IMPLANT
DRAPE LAPAROTOMY 100X77 ABD (DRAPES) ×2 IMPLANT
DRAPE MICROSCOPE SPINE 48X150 (DRAPES) IMPLANT
DRAPE SURG 17X11 SM STRL (DRAPES) ×2 IMPLANT
DRSG TEGADERM 4X4.75 (GAUZE/BANDAGES/DRESSINGS) ×2 IMPLANT
DRSG TELFA 3X8 NADH (GAUZE/BANDAGES/DRESSINGS) ×2 IMPLANT
DURASEAL APPLICATOR TIP (TIP) IMPLANT
DURASEAL SPINE SEALANT 3ML (MISCELLANEOUS) IMPLANT
ELECT CAUTERY BLADE TIP 2.5 (TIP) ×2
ELECT EZSTD 165MM 6.5IN (MISCELLANEOUS) ×2
ELECT REM PT RETURN 9FT ADLT (ELECTROSURGICAL) ×2
ELECTRODE CAUTERY BLDE TIP 2.5 (TIP) ×1 IMPLANT
ELECTRODE EZSTD 165MM 6.5IN (MISCELLANEOUS) ×1 IMPLANT
ELECTRODE REM PT RTRN 9FT ADLT (ELECTROSURGICAL) ×1 IMPLANT
GAUZE SPONGE 4X4 12PLY STRL (GAUZE/BANDAGES/DRESSINGS) IMPLANT
GLOVE BIOGEL PI IND STRL 7.0 (GLOVE) ×1 IMPLANT
GLOVE BIOGEL PI IND STRL 8 (GLOVE) ×1 IMPLANT
GLOVE BIOGEL PI INDICATOR 7.0 (GLOVE) ×1
GLOVE BIOGEL PI INDICATOR 8 (GLOVE) ×1
GLOVE SURG SYN 7.0 (GLOVE) ×4 IMPLANT
GLOVE SURG SYN 7.0 PF PI (GLOVE) ×2 IMPLANT
GLOVE SURG SYN 8.0 (GLOVE) ×2 IMPLANT
GLOVE SURG SYN 8.0 PF PI (GLOVE) ×1 IMPLANT
GOWN STRL REUS W/ TWL LRG LVL3 (GOWN DISPOSABLE) ×1 IMPLANT
GOWN STRL REUS W/ TWL XL LVL3 (GOWN DISPOSABLE) ×2 IMPLANT
GOWN STRL REUS W/TWL LRG LVL3 (GOWN DISPOSABLE) ×2
GOWN STRL REUS W/TWL XL LVL3 (GOWN DISPOSABLE) ×4
GRADUATE 1200CC STRL 31836 (MISCELLANEOUS) ×2 IMPLANT
KIT TURNOVER KIT A (KITS) ×2 IMPLANT
KIT WILSON FRAME (KITS) ×2 IMPLANT
MARKER SKIN DUAL TIP RULER LAB (MISCELLANEOUS) ×4 IMPLANT
NDL SAFETY ECLIPSE 18X1.5 (NEEDLE) ×1 IMPLANT
NEEDLE HYPO 18GX1.5 SHARP (NEEDLE) ×2
NEEDLE HYPO 22GX1.5 SAFETY (NEEDLE) ×2 IMPLANT
NS IRRIG 1000ML POUR BTL (IV SOLUTION) ×2 IMPLANT
PACK LAMINECTOMY NEURO (CUSTOM PROCEDURE TRAY) ×2 IMPLANT
PAD ARMBOARD 7.5X6 YLW CONV (MISCELLANEOUS) ×2 IMPLANT
PAD DRESSING TELFA 3X8 NADH (GAUZE/BANDAGES/DRESSINGS) IMPLANT
SPOGE SURGIFLO 8M (HEMOSTASIS)
SPONGE SURGIFLO 8M (HEMOSTASIS) IMPLANT
STAPLER SKIN PROX 35W (STAPLE) IMPLANT
SUT ETHILON 3-0 FS-10 30 BLK (SUTURE) ×4
SUT POLYSORB 2-0 5X18 GS-10 (SUTURE) ×10 IMPLANT
SUT VIC AB 0 CT1 18XCR BRD 8 (SUTURE) ×2 IMPLANT
SUT VIC AB 0 CT1 8-18 (SUTURE) ×4
SUTURE EHLN 3-0 FS-10 30 BLK (SUTURE) ×2 IMPLANT
SYR 10ML LL (SYRINGE) ×4 IMPLANT
SYR 20ML LL LF (SYRINGE) ×2 IMPLANT
SYR 30ML LL (SYRINGE) ×4 IMPLANT
SYR 3ML LL SCALE MARK (SYRINGE) ×2 IMPLANT
TOWEL OR 17X26 4PK STRL BLUE (TOWEL DISPOSABLE) ×4 IMPLANT
TUBING CONNECTING 10 (TUBING) ×2 IMPLANT

## 2020-01-12 NOTE — Addendum Note (Signed)
Addendum  created 01/12/20 1308 by Kelton Pillar, CRNA   Charge Capture section accepted

## 2020-01-12 NOTE — Anesthesia Preprocedure Evaluation (Addendum)
Anesthesia Evaluation  Patient identified by MRN, date of birth, ID band Patient awake    Reviewed: Allergy & Precautions, H&P , NPO status , Patient's Chart, lab work & pertinent test results  Airway Mallampati: III  TM Distance: <3 FB Neck ROM: limited    Dental  (+) Chipped, Poor Dentition, Missing, Partial Lower, Upper Dentures   Pulmonary neg shortness of breath, former smoker,    Pulmonary exam normal        Cardiovascular Exercise Tolerance: Good hypertension, (-) angina(-) DOE Normal cardiovascular exam+ dysrhythmias      Neuro/Psych  Headaches, PSYCHIATRIC DISORDERS  Neuromuscular disease    GI/Hepatic Neg liver ROS, GERD  Medicated and Controlled,  Endo/Other  negative endocrine ROS  Renal/GU      Musculoskeletal   Abdominal   Peds  Hematology negative hematology ROS (+)   Anesthesia Other Findings Past Medical History: No date: Anxiety No date: Borderline diabetes 06/25/2010: BRONCHITIS, ACUTE WITH BRONCHOSPASM     Comment:  Qualifier: Diagnosis of  By: Alveta Heimlich MD, Cornelia Copa   10/18/2015: Chronic prescription benzodiazepine use No date: Depression No date: Dysrhythmia     Comment:  tachycardia 05/14/2009: GASTROENTERITIS WITHOUT DEHYDRATION     Comment:  Qualifier: Diagnosis of  By: Deborra Medina MD, Talia   No date: GERD (gastroesophageal reflux disease) No date: 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/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               Dossie Arbour Serial number: [id number] Model number: [id number]   Past Surgical History: No date: ABDOMINAL HYSTERECTOMY No date: APPENDECTOMY No  date: BACK SURGERY     Comment:  X 3 No date: BREAST EXCISIONAL BIOPSY; Right     Comment:  surgical bx age 89  08/28/2013: COLONOSCOPY; N/A     Comment:  Procedure: COLONOSCOPY;  Surgeon: Danie Binder, MD;                [facility];  Service: Endoscopy;                Laterality: N/A;  8:30 AM 05/30/2016: INTRAMEDULLARY (IM) NAIL INTERTROCHANTERIC; Left     Comment:  Procedure: INTRAMEDULLARY (IM) NAIL INTERTROCHANTRIC;                Surgeon: Thornton Park, MD;  Location: [facility] ORS;                Service: Orthopedics;  Laterality: Left; No date: SHOULDER SURGERY; Right No date: Spinal cord stimulator     Reproductive/Obstetrics negative OB ROS                             Anesthesia Physical Anesthesia Plan  ASA: III  Anesthesia Plan: General   Post-op Pain Management:    Induction: Intravenous  PONV Risk Score and Plan: Propofol infusion and TIVA  Airway Management Planned: Natural Airway and Nasal Cannula  Additional Equipment:   Intra-op Plan:   Post-operative Plan:   Informed Consent: I have reviewed the patients History and Physical, chart, labs and discussed the procedure including the risks, benefits and alternatives for the proposed anesthesia with the patient or authorized representative  who has indicated his/her understanding and acceptance.     Dental Advisory Given  Plan Discussed with: Anesthesiologist, CRNA and Surgeon  Anesthesia Plan Comments: (Patient consented for risks of anesthesia including but not limited to:  - adverse reactions to medications - risk of intubation if required - damage to eyes, teeth, lips or other oral mucosa - nerve damage due to positioning  - sore throat or hoarseness - Damage to heart, brain, nerves, lungs, other parts of body or loss of life  Patient voiced understanding.)       Anesthesia Quick Evaluation

## 2020-01-12 NOTE — Op Note (Signed)
Operative Note  SURGERY DATE:01/12/2020  PRE-OP DIAGNOSIS: Chronic pain syndrome  POST-OP DIAGNOSIS:Post-Op Diagnosis Codes: Chronic pain syndrome  Procedure(s) with comments: Removal Spinal Cord Stimulator  SURGEON:  * Malen Gauze, MD Christine Zdeb-NP,assisting  ANESTHESIA:General  OPERATIVE FINDINGS:Successful placement ofintrathecal morphine pump  Indication Ms. Creegan seen in clinic on7/20after having a previous spinal cord stimulator placed which was not providing relief and the hardware was causing pain.  We discussed removal for better pain control.The patient wished to proceed. Risks including weakness, hematoma, infection, failure of pain relief, post-operative pain, need for revision, stroke, heart attack, pneumonia were discussed.    Procedure The patient was brought to the operating room where vascular access was obtained andhe wasintubated by the anesthesia service. Antibiotics were given. The patient wasplaced in a prone position on Wilson frame. The patient was prepped and draped in a sterile fashionwith prior lumbar and left flank incision marked. A hard time out was performed. Local anesthetic was instilled into incisions.   The incisions were opened sharply and hardware identified. In midline lumbar. The leads and anchors were dissected free and then removed with electrodes intact without resistance. These were cut. Next, the pulse generator was removed from flank and the leads pulled through until all hardware removed.   Next, hemostasis was achieved. The incisions were irrigated profusely. Then, the incisions were closed with combination of 2-0 vicryls. The skin was closed with 3-0 Nylon. Sterile dressings were applied. The patient was extubated. The patient was taken to PACU for recovery. The family was updated and all questions answered.   ESTIMATED BLOOD LOSS: 10cc  SPECIMENS Explanted SCS leads and  pulse generator  IMPLANT     None     I performed the case in its entiretywiththe assistance ofChristine Zdeb,NP.  Deetta Perla, [place]

## 2020-01-12 NOTE — OR Nursing (Signed)
Ambulatory, tolerating po's, bathroom void.  Discharge pending transportation.

## 2020-01-12 NOTE — Anesthesia Procedure Notes (Addendum)
Procedure Name: Intubation Performed by: Fletcher-Harrison, Ercole Georg, CRNA Pre-anesthesia Checklist: Patient identified, Emergency Drugs available, Suction available and Patient being monitored Patient Re-evaluated:Patient Re-evaluated prior to induction Oxygen Delivery Method: Circle system utilized Preoxygenation: Pre-oxygenation with 100% oxygen Induction Type: IV induction Ventilation: Mask ventilation without difficulty Laryngoscope Size: McGraph and 3 Grade View: Grade I Tube type: Oral Tube size: 6.5 mm Number of attempts: 1 Airway Equipment and Method: Stylet and Oral airway Placement Confirmation: ETT inserted through vocal cords under direct vision,  positive ETCO2,  breath sounds checked- equal and bilateral and CO2 detector Secured at: 21 cm Tube secured with: Tape Dental Injury: Teeth and Oropharynx as per pre-operative assessment        

## 2020-01-12 NOTE — Anesthesia Postprocedure Evaluation (Signed)
Anesthesia Post Note  Patient: Wanda [name]  Procedure(s) Performed: REMOVAL SPINAL CORD STIMULATOR AND PULSE GENERATOR (N/A )  Patient location during evaluation: PACU Anesthesia Type: MAC Level of consciousness: awake and alert Pain management: pain level controlled Vital Signs Assessment: post-procedure vital signs reviewed and stable Respiratory status: spontaneous breathing, nonlabored ventilation, respiratory function stable and patient connected to nasal cannula oxygen Cardiovascular status: blood pressure returned to baseline and stable Postop Assessment: no apparent nausea or vomiting Anesthetic complications: no   No complications documented.   Last Vitals:  Vitals:   01/12/20 1002 01/12/20 1027  BP: 135/74 137/80  Pulse: 86 94  Resp: 16 18  Temp: 37 C 36.8 C  SpO2: 96% 97%    Last Pain:  Vitals:   01/12/20 1027  TempSrc: Temporal  PainSc: 0-No pain                 Precious Haws Anamae Rochelle

## 2020-01-12 NOTE — H&P (Signed)
Wanda [name] is an 64 y.o. female.   Chief Complaint: Chronic pain HPI: Ms. Bucker is here for evaluation of ongoing pain around the battery site of her spinal cord stimulator. She states that she had the stimulator placed for back and leg pain but it never gave her complete relief. Unfortunately, the battery stopped working last year and she states she has not noticed much change in her pain level. Currently, the only pain she is experiencing in the back is towards the battery on the left where she feels like there is a lot of pulling sensation. She does not endorse any extreme pains in her legs but she does note she is more tolerable now of this. She does still have some low back pain. Given the lack of relief from the stimulator, she would like to have it removed.    Past Medical History:  Diagnosis Date  . Anxiety   . Borderline diabetes   . BRONCHITIS, ACUTE WITH BRONCHOSPASM 06/25/2010   Qualifier: Diagnosis of  By: Alveta Heimlich MD, Cornelia Copa    . Chronic prescription benzodiazepine use 10/18/2015  . Depression   . Dysrhythmia    tachycardia  . GASTROENTERITIS WITHOUT DEHYDRATION 05/14/2009   Qualifier: Diagnosis of  By: Deborra Medina MD, Tanja Port    . GERD (gastroesophageal reflux disease)   . Herpes   . 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 History of kidney stones   . Hyperlipidemia   . Hypertension   . Irregular heart beat   . Pre-diabetes   . Sinus tachycardia 04/11/2017  . Skin cancer of face 08/01/2018  . 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 Dossie Arbour Serial number: [id number] Model number: [id number]     Past Surgical History:  Procedure Laterality Date  . ABDOMINAL HYSTERECTOMY    . APPENDECTOMY    . BACK SURGERY     X 3  . BREAST EXCISIONAL BIOPSY Right    surgical bx age 45   . COLONOSCOPY N/A 08/28/2013   Procedure: COLONOSCOPY;  Surgeon: Danie Binder, MD;  Location: [facility];  Service: Endoscopy;  Laterality: N/A;  8:30 AM  . INTRAMEDULLARY (IM) NAIL INTERTROCHANTERIC Left 05/30/2016   Procedure: INTRAMEDULLARY (IM) NAIL INTERTROCHANTRIC;  Surgeon: Thornton Park, MD;  Location: [facility] ORS;  Service: Orthopedics;  Laterality: Left;  . SHOULDER SURGERY Right   . Spinal cord stimulator      Family History  Problem Relation Age of Onset  . Diabetes Mother   . COPD Mother   . Heart disease Mother   . Diabetes Father   . COPD Father   . Heart disease Father   . Heart attack Father 83       CABG  . Alcohol abuse Daughter   . Depression Daughter   . Drug abuse Daughter   . Anxiety disorder Daughter   . Tuberculosis Maternal Aunt   . Cancer Paternal Aunt        breast  . Breast cancer Paternal Aunt   . Diabetes Paternal Grandmother   . Diabetes Sister   . Cancer Cousin   . Diabetes Cousin   . Colon cancer Neg Hx   . Sudden Cardiac Death Neg Hx    Social History:  reports that she quit smoking about 29 years ago. Her smoking use included cigarettes. She has a 3.50 pack-year smoking history. She has never used smokeless tobacco. She reports current alcohol  use. She reports that she does not use drugs.  Allergies:  Allergies  Allergen Reactions  . Levofloxacin Hives and Nausea And Vomiting  . Tramadol Other (See Comments)    Severe headache  . Codeine Nausea Only    Slight nausea  . Mobic [Meloxicam] Other (See Comments)    Calf tightness  . Propoxyphene Hcl Rash    Medications Prior to Admission  Medication Sig Dispense Refill  . ARIPiprazole (ABILIFY) 5 MG tablet TAKE 1 TABLET(5 MG) BY MOUTH DAILY (Patient taking differently: Take 5 mg by mouth daily. ) 90 tablet 0  . aspirin EC 81 MG tablet Take 81 mg by mouth daily.    Marland Kitchen atorvastatin (LIPITOR) 40 MG tablet Take 40 mg by mouth daily. PM    . cetirizine (ZYRTEC) 10 MG tablet Take 10 mg by mouth daily.     . Cholecalciferol (VITAMIN D3) 5000 units TABS Take 5,000 Units by mouth daily.     Marland Kitchen dexlansoprazole (DEXILANT) 60 MG capsule Take 60 mg by mouth daily.     . famotidine (PEPCID) 40 MG tablet Take 40 mg by mouth daily.     . hydrOXYzine (VISTARIL) 25 MG capsule TAKE 1 CAPSULE(25 MG) BY MOUTH THREE TIMES DAILY AS NEEDED FOR ANXIETY 90 capsule 0  . linaclotide (LINZESS) 72 MCG capsule Take 72 mcg by mouth daily before breakfast. PRN    . LORazepam (ATIVAN) 1 MG tablet Take 0.5 tablets (0.5 mg total) by mouth as directed. Take half tablet ( 0.5 mg) as needed during the day for severe anxiety attacks and half tablet ( 0.5 mg) at bedtime for severe anxiety attacks and sleep. (Patient taking differently: Take 0.5 mg by mouth at bedtime. ) 28 tablet 1  . metoprolol succinate (TOPROL-XL) 25 MG 24 hr tablet TAKE 1 TABLET(25 MG) BY MOUTH DAILY 90 tablet 2  . mirtazapine (REMERON) 15 MG tablet TAKE 1 TABLET(15 MG) BY MOUTH AT BEDTIME FOR SLEEP 30 tablet 3  . Multiple Vitamin (MULTIVITAMIN) tablet Take 1 tablet by mouth daily.    . potassium chloride SA (KLOR-CON) 20 MEQ tablet Take 2 tablets (40 mEq total) by mouth daily. 180 tablet 3  . risedronate (ACTONEL) 35 MG tablet Take 35 mg by mouth every 7 (seven) days.     . Vilazodone HCl (VIIBRYD) 40 MG TABS DAILY 30 tablet 3    No results found for this or any previous visit (from the past 48 hour(s)). No results found.  Review of Systems General ROS: Negative Psychological ROS: Negative Ophthalmic ROS: Negative ENT ROS: Negative Hematological and Lymphatic ROS: Negative  Endocrine ROS: Negative Respiratory ROS: Negative Cardiovascular ROS: Negative Gastrointestinal ROS: Negative Genito-Urinary ROS: Negative Musculoskeletal ROS: Positive for back pain Neurological ROS: Negative Dermatological ROS: Negative  Blood pressure (!) 139/92, pulse (!) 106, temperature 98.7 F (37.1 C), temperature source Tympanic, resp. rate 16, last menstrual period 09/30/1991, SpO2 99 %. Physical Exam  General appearance: Alert, cooperative, in no  acute distress Head: Normocephalic, atraumatic Eyes: Normal, EOM intact Oropharynx: Wearing facemask CV: Regular rate and rhythm Pulm: Clear to auscultation Back: Well-healed midline incision, well-healed left neck incision Ext: No edema in LE bilaterally,  Neurologic exam:  Mental status: alertness: alert, affect: normal Speech: fluent and clear Motor:strength symmetric 5/5 in bilateral lower extremities in all motor groups Sensory: intact to light touch in lateral lower extremities Gait: normal    Imaging: Lumbar spine x-rays: There is evidence of percutaneous spinal cord stimulator leads placed at the T12-L1  level. This is connected to the left flank pulse generator   Assessment/Plan Removal of SCS system  Deetta Perla, MD 01/12/2020, 6:39 AM

## 2020-01-12 NOTE — Discharge Summary (Signed)
Procedure: removal of spinal cord stimulator Procedure date: 01/12/2020 Diagnosis: failure of SCS therapy, chronic pain    History: Wanda [name] is s/p removal of SCS including leads and IPG POD0: Tolerated procedure well. Evaluated in post op recovery still disoriented from anesthesia but able to answer questions and obey commands.   Physical Exam: Vitals:   01/12/20 0629  BP: (!) 139/92  Pulse: (!) 106  Resp: 16  Temp: 98.7 F (37.1 C)  SpO2: 99%    General: Alert and oriented, lying in bed Strength:5/5 throughout  Sensation: intact and symmetric throughout  Skin: incisions with telfa dressings, clean, dry, intact  Data:  No results for input(s): NA, K, CL, CO2, BUN, CREATININE, LABGLOM, GLUCOSE, CALCIUM in the last 168 hours. No results for input(s): AST, ALT, ALKPHOS in the last 168 hours.  Invalid input(s): TBILI   No results for input(s): WBC, HGB, HCT, PLT in the last 168 hours. No results for input(s): APTT, INR in the last 168 hours.       Assessment/Plan:  Wanda [name] is POD0s/p removal of SCS.   Once she is able to ambulate, void, and tolerate PO, she is cleared for discharge to home.   She will f/u in 2 weeks with me in clinic.  She was given a prescription for oxycodone as well as Narcan with instructions on when to use this medication. She was counseled to avoid taking opiates with benzodiazepines and other sedating medications.   Lonell Face, NP Department of Neurosurgery

## 2020-01-12 NOTE — Transfer of Care (Signed)
Immediate Anesthesia Transfer of Care Note  Patient: Wanda [name]  Procedure(s) Performed: REMOVAL SPINAL CORD STIMULATOR AND PULSE GENERATOR (N/A )  Patient Location: PACU  Anesthesia Type:General  Level of Consciousness: awake, alert , oriented and patient cooperative  Airway & Oxygen Therapy: Patient Spontanous Breathing  Post-op Assessment: Report given to RN and Post -op Vital signs reviewed and stable  Post vital signs: Reviewed and stable  Last Vitals:  Vitals Value Taken Time  BP 133/82 01/12/20 0839  Temp    Pulse 108 01/12/20 0840  Resp 16 01/12/20 0840  SpO2 94 % 01/12/20 0840  Vitals shown include unvalidated device data.  Last Pain:  Vitals:   01/12/20 0629  TempSrc: Tympanic  PainSc: 3          Complications: No complications documented.

## 2020-01-12 NOTE — Progress Notes (Signed)
error 

## 2020-01-12 NOTE — Discharge Instructions (Addendum)
Please hold your baby aspirin for 7 days.  Your surgeon has removed your Spinal Cord Stimulator.  Many times, patients feel better immediately after surgery and can "overdo it." Even if you feel well, it is important that you follow these activity guidelines. If you do not let your back heal properly from the surgery, you can increase the chance of a disc herniation and/or return of your symptoms. The following are instructions to help in your recovery once you have been discharged from the hospital.  * Do not take anti-inflammatory medications for 3 days after surgery (naproxen [Aleve], ibuprofen [Advil, Motrin], celecoxib [Celebrex], etc.)  Activity    We have removed your spinal cord stimulator, so there are no spine precautions necessary.   Increase physical activity slowly as tolerated.  Taking short walks is encouraged, but avoid strenuous exercise. Do not jog, run, bicycle, lift weights, or participate in any other exercises unless specifically allowed by your doctor. Avoid prolonged sitting, including car rides.  Talk to your doctor before resuming sexual activity.  You should not drive until cleared by your doctor.  Until released by your doctor, you should not return to work or school.  You should rest at home and let your body heal.   You may shower two days after your surgery.  After showering, lightly dab your incision dry. Do not take a tub bath or go swimming for 3 weeks, or until approved by your doctor at your follow-up appointment.  If you smoke, we strongly recommend that you quit.  Smoking has been proven to interfere with normal healing in your back and will dramatically reduce the success rate of your surgery. Please contact [facility] (800-QUIT-NOW) and use the resources at www.[facility].com for assistance in stopping smoking.  Surgical Incision   If you have a dressing on your incision, you may remove it three days after your surgery. Keep your incision area clean  and dry.  If you have staples or stitches on your incision, you should have a follow up scheduled for removal. If you do not have staples or stitches, you will have steri-strips (small pieces of surgical tape) or Dermabond glue. The steri-strips/glue should begin to peel away within about a week (it is fine if the steri-strips fall off before then). If the strips are still in place one week after your surgery, you may gently remove them.  Diet            You may return to your usual diet. Be sure to stay hydrated.  When to Contact us  Although your surgery and recovery will likely be uneventful, you may have some residual numbness, aches, and pains in your back and/or legs. This is normal and should improve in the next few weeks.  However, should you experience any of the following, contact us immediately: . New numbness or weakness . Pain that is progressively getting worse, and is not relieved by your pain medications or rest . Bleeding, redness, swelling, pain, or drainage from surgical incision . Chills or flu-like symptoms . Fever greater than 101.0 F (38.3 C) . Problems with bowel or bladder functions . Difficulty breathing or shortness of breath . Warmth, tenderness, or swelling in your calf  Contact Information . During office hours (Monday-Friday 9 am to 5 pm), please call your physician at [phone] . After hours and weekends, please call [phone] and an answering service will put you in touch with either Dr. Lacinda Axon or Dr. Izora Ribas.  . For a life-threatening  emergency, call [place]   1) The drugs that you were given will stay in your system until tomorrow so for the next 24 hours you should not:  A) Drive an automobile B) Make any legal decisions C) Drink any alcoholic beverage   2) You may resume regular meals tomorrow.  Today it is better to start with liquids and gradually work up to solid foods.  You may eat  anything you prefer, but it is better to start with liquids, then soup and crackers, and gradually work up to solid foods.   3) Please notify your doctor immediately if you have any unusual bleeding, trouble breathing, redness and pain at the surgery site, drainage, fever, or pain not relieved by medication.    4) Additional Instructions:        Please contact your physician with any problems or Same Day Surgery at [phone], Monday through Friday 6 am to 4 pm, or [place] at [facility] number at [phone].

## 2020-01-12 NOTE — Interval H&P Note (Signed)
History and Physical Interval Note:  01/12/2020 6:40 AM  Wanda [name]  has presented today for surgery, with the diagnosis of failed spinal cord stimulator.  The various methods of treatment have been discussed with the patient and family. After consideration of risks, benefits and other options for treatment, the patient has consented to  Procedure(s) with comments: [place] (N/A) - Local w/ MAC as a surgical intervention.  The patient's history has been reviewed, patient examined, no change in status, stable for surgery.  I have reviewed the patient's chart and labs.  Questions were answered to the patient's satisfaction.     Deetta Perla

## 2020-01-13 ENCOUNTER — Ambulatory Visit: Payer: Medicare HMO | Admitting: Licensed Clinical Social Worker

## 2020-01-13 ENCOUNTER — Telehealth: Payer: Self-pay | Admitting: Licensed Clinical Social Worker

## 2020-01-13 NOTE — Telephone Encounter (Signed)
Tried to connect for patient's scheduled appointment via MyChart text link and email link unsuccessfully.  LCSW counselor called pt on telephone and pt answered--stated that she had procedure done yesterday and wants to reschedule OPT appointment. Pts appt rescheduled to 9/3 @ 10am

## 2020-01-22 ENCOUNTER — Other Ambulatory Visit: Payer: Self-pay | Admitting: Psychiatry

## 2020-01-22 DIAGNOSIS — F3342 Major depressive disorder, recurrent, in full remission: Secondary | ICD-10-CM

## 2020-01-22 DIAGNOSIS — F411 Generalized anxiety disorder: Secondary | ICD-10-CM

## 2020-01-27 ENCOUNTER — Other Ambulatory Visit: Payer: Self-pay | Admitting: Pediatrics

## 2020-01-27 DIAGNOSIS — Z1231 Encounter for screening mammogram for malignant neoplasm of breast: Secondary | ICD-10-CM

## 2020-01-30 ENCOUNTER — Other Ambulatory Visit: Payer: Self-pay

## 2020-01-30 ENCOUNTER — Encounter: Payer: Self-pay | Admitting: Psychiatry

## 2020-01-30 ENCOUNTER — Telehealth (INDEPENDENT_AMBULATORY_CARE_PROVIDER_SITE_OTHER): Payer: Medicare HMO | Admitting: Psychiatry

## 2020-01-30 DIAGNOSIS — Z634 Disappearance and death of family member: Secondary | ICD-10-CM

## 2020-01-30 DIAGNOSIS — F3342 Major depressive disorder, recurrent, in full remission: Secondary | ICD-10-CM

## 2020-01-30 DIAGNOSIS — F4010 Social phobia, unspecified: Secondary | ICD-10-CM | POA: Diagnosis not present

## 2020-01-30 DIAGNOSIS — F411 Generalized anxiety disorder: Secondary | ICD-10-CM

## 2020-01-30 MED ORDER — HYDROXYZINE PAMOATE 25 MG PO CAPS: ORAL_CAPSULE | ORAL | 0 refills | Status: DC

## 2020-01-30 MED ORDER — ARIPIPRAZOLE 5 MG PO TABS: ORAL_TABLET | ORAL | 0 refills | Status: DC

## 2020-01-30 NOTE — Progress Notes (Signed)
**Wanda [name] De-Identified via Obfuscation** Provider Location : [facility] Patient Location : Home  Participants: Patient , Provider  Virtual Visit via Video Wanda [name]  I connected with Wanda Wanda [name] on 01/30/20 at 11:20 AM EDT by a video enabled telemedicine application and verified that I am speaking with the correct person using two identifiers.   I discussed the limitations of evaluation and management by telemedicine and the availability of in person appointments. The patient expressed understanding and agreed to proceed.    I discussed the assessment and treatment plan with the patient. The patient was provided an opportunity to ask questions and all were answered. The patient agreed with the plan and demonstrated an understanding of the instructions.   The patient was advised to call back or seek an in-person evaluation if the symptoms worsen or if the condition fails to improve as anticipated.   Wanda Wanda [name]  01/30/2020 12:35 PM Wanda Wanda [name]  MRN:  [id number]  Chief Complaint:  Chief Complaint    Follow-up     HPI: Wanda Wanda [name] is a 64 year old Caucasian female, widowed on SSI, lives in [place], has a history of GAD, MDD, SVT, orthostatic hypotension, hyperlipidemia, grief was evaluated by telemedicine today.  Patient today reports she continues to grieve the loss of her daughter who passed away.  She has good days and bad days.  Patient continued to be tearful in session today like the previous visit however she reports that she is coping with her daughter's loss better than before.  She continues to have relationship struggles with her son-in-law.  Patient however reports she is trying to be there for her grandchildren as best as she can.  She reports she is doing well on the current medications.  She denies any side effects.  She continues to be in therapy with her counselor and reports therapy sessions as beneficial.  Patient denies any suicidality, homicidality or perceptual disturbances.  Patient  denies any other concerns today.    Visit Diagnosis:    ICD-10-CM   1. GAD (generalized anxiety disorder)  F41.1 ARIPiprazole (ABILIFY) 5 MG tablet    hydrOXYzine (VISTARIL) 25 MG capsule  2. MDD (major depressive disorder), recurrent, in full remission ([place])  F33.42 ARIPiprazole (ABILIFY) 5 MG tablet  3. Bereavement  Z63.4   4. Social anxiety disorder  F40.10     Past Psychiatric History: I have reviewed past psychiatric history from my progress Wanda [name] on 08/21/2017.  Past trials of Zoloft, Cymbalta, Effexor, Paxil, Prozac, Wellbutrin, Xanax, prazosin, Seroquel, trazodone, doxepin  Past Medical History:  Past Medical History:  Diagnosis Date  . Anxiety   . Borderline diabetes   . BRONCHITIS, ACUTE WITH BRONCHOSPASM 06/25/2010   Qualifier: Diagnosis of  By: Alveta Heimlich MD, Cornelia Copa    . Chronic prescription benzodiazepine use 10/18/2015  . Depression   . Dysrhythmia    tachycardia  . GASTROENTERITIS WITHOUT DEHYDRATION 05/14/2009   Qualifier: Diagnosis of  By: Deborra Medina MD, Tanja Port    . GERD (gastroesophageal reflux disease)   . Herpes   . 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 History of kidney stones   . Hyperlipidemia   . Hypertension   . Irregular heart beat   . Pre-diabetes   . Sinus tachycardia 04/11/2017  . Skin cancer of face 08/01/2018  . 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 Dossie Arbour Serial number: [id number] Model number: [id number]     Past Surgical History:  Procedure Laterality Date  . ABDOMINAL HYSTERECTOMY    . APPENDECTOMY    . BACK SURGERY     X 3  . BREAST EXCISIONAL BIOPSY Right    surgical bx age 64   . COLONOSCOPY N/A 08/28/2013   Procedure: COLONOSCOPY;  Surgeon: Danie Binder, MD;  [facility]: [facility];  Service: Endoscopy;  Laterality: N/A;  8:30 AM  . INTRAMEDULLARY (IM) NAIL INTERTROCHANTERIC Left 05/30/2016   Procedure: INTRAMEDULLARY (IM) NAIL  INTERTROCHANTRIC;  Surgeon: Thornton Park, MD;  Location: [facility] ORS;  Service: Orthopedics;  Laterality: Left;  . SHOULDER SURGERY Right   . Spinal cord stimulator    . SPINAL CORD STIMULATOR REMOVAL N/A 01/12/2020   Procedure: REMOVAL SPINAL CORD STIMULATOR AND PULSE GENERATOR;  Surgeon: Deetta Perla, MD;  Location: [facility] ORS;  Service: Neurosurgery;  Laterality: N/A;  Local w/ MAC    Family Psychiatric History: I have reviewed family psychiatric history from my progress Wanda [name] on 08/21/2017.  Family History:  Family History  Problem Relation Age of Onset  . Diabetes Mother   . COPD Mother   . Heart disease Mother   . Diabetes Father   . COPD Father   . Heart disease Father   . Heart attack Father 61       CABG  . Alcohol abuse Daughter   . Depression Daughter   . Drug abuse Daughter   . Anxiety disorder Daughter   . Tuberculosis Maternal Aunt   . Cancer Paternal Aunt        breast  . Breast cancer Paternal Aunt   . Diabetes Paternal Grandmother   . Diabetes Sister   . Cancer Cousin   . Diabetes Cousin   . Colon cancer Neg Hx   . Sudden Cardiac Death Neg Hx     Social History: I have reviewed social history from my progress Wanda [name] on 08/21/2017. Social History   Socioeconomic History  . Marital status: Divorced    Spouse name: Not on file  . Number of children: 2  . Years of education: Not on file  . Highest education level: High school graduate  Occupational History    Comment: disabled  Tobacco Use  . Smoking status: Former Smoker    Packs/day: 0.25    Years: 14.00    Pack years: 3.50    Types: Cigarettes    Quit date: 1992    Years since quitting: 29.6  . Smokeless tobacco: Never Used  Vaping Use  . Vaping Use: Never used  Substance and Sexual Activity  . Alcohol use: Yes    Comment: occasional; once every 6 months  . Drug use: No  . Sexual activity: Yes    Birth control/protection: Surgical  Other Topics Concern  . Not on file  Social History Narrative   . Not on file   Social Determinants of Health   Financial Resource Strain:   . Difficulty of Paying Living Expenses: Not on file  Food Insecurity:   . Worried About Charity fundraiser in the Last Year: Not on file  . Ran Out of Food in the Last Year: Not on file  Transportation Needs:   . Lack of Transportation (Medical): Not on file  . Lack of Transportation (Non-Medical): Not on file  Physical Activity:   . Days of Exercise per Week: Not on file  . Minutes of Exercise per Session: Not on file  Stress:   . Feeling of Stress : Not on file  Social Connections:   .  Frequency of Communication with Friends and Family: Not on file  . Frequency of Social Gatherings with Friends and Family: Not on file  . Attends Religious Services: Not on file  . Active Member of Clubs or Organizations: Not on file  . Attends Archivist Meetings: Not on file  . Marital Status: Not on file    Allergies:  Allergies  Allergen Reactions  . Levofloxacin Hives and Nausea And Vomiting  . Tramadol Other (See Comments)    Severe headache  . Codeine Nausea Only    Slight nausea  . Mobic [Meloxicam] Other (See Comments)    Calf tightness  . Propoxyphene Hcl Rash    Metabolic Disorder Labs: Lab Results  Component Value Date   HGBA1C 5.4 05/29/2016   MPG 108 05/29/2016   No results found for: PROLACTIN Lab Results  Component Value Date   CHOL 198 07/08/2018   TRIG 371 (H) 07/08/2018   HDL 46 07/08/2018   CHOLHDL 4.3 07/08/2018   VLDL 59 (H) 09/29/2016   LDLCALC 78 07/08/2018   LDLCALC 106 (H) 09/29/2016   Lab Results  Component Value Date   TSH 2.560 07/08/2018   TSH 2.050 10/18/2016    Therapeutic Level Labs: No results found for: LITHIUM No results found for: VALPROATE No components found for:  CBMZ  Current Medications: Current Outpatient Medications  Medication Sig Dispense Refill  . ARIPiprazole (ABILIFY) 5 MG tablet TAKE 1 TABLET(5 MG) BY MOUTH DAILY 90 tablet 0   . atorvastatin (LIPITOR) 40 MG tablet Take 40 mg by mouth daily. PM    . cetirizine (ZYRTEC) 10 MG tablet Take 10 mg by mouth daily.     . Cholecalciferol (VITAMIN D3) 5000 units TABS Take 5,000 Units by mouth daily.    Marland Kitchen dexlansoprazole (DEXILANT) 60 MG capsule Take 60 mg by mouth daily.     . famotidine (PEPCID) 40 MG tablet Take 40 mg by mouth daily.     . hydrOXYzine (VISTARIL) 25 MG capsule Take three times a day as needed for severe anxiety attacks only 90 capsule 0  . linaclotide (LINZESS) 72 MCG capsule Take 72 mcg by mouth daily before breakfast. PRN    . LORazepam (ATIVAN) 1 MG tablet Take 0.5 tablets (0.5 mg total) by mouth as directed. Take half tablet ( 0.5 mg) as needed during the day for severe anxiety attacks and half tablet ( 0.5 mg) at bedtime for severe anxiety attacks and sleep. (Patient taking differently: Take 0.5 mg by mouth at bedtime. ) 28 tablet 1  . metoprolol succinate (TOPROL-XL) 25 MG 24 hr tablet TAKE 1 TABLET(25 MG) BY MOUTH DAILY 90 tablet 2  . mirtazapine (REMERON) 15 MG tablet TAKE 1 TABLET(15 MG) BY MOUTH AT BEDTIME FOR SLEEP 30 tablet 3  . Multiple Vitamin (MULTIVITAMIN) tablet Take 1 tablet by mouth daily.    . naloxone (NARCAN) 0.4 MG/ML injection     . Naloxone HCl 0.4 MG/0.4ML SOAJ Take if having symptoms of opioid overdose including extreme sleepiness or stopping breathing 0.4 mL 0  . potassium chloride SA (KLOR-CON) 20 MEQ tablet Take 2 tablets (40 mEq total) by mouth daily. 180 tablet 3  . risedronate (ACTONEL) 35 MG tablet Take 35 mg by mouth every 7 (seven) days.     . Vilazodone HCl (VIIBRYD) 40 MG TABS DAILY 30 tablet 3   No current facility-administered medications for this visit.     Musculoskeletal: Strength & Muscle Tone: UTA Gait & Station: normal Patient leans:  N/A  Psychiatric Specialty Exam: Review of Systems  Musculoskeletal: Positive for back pain.  Psychiatric/Behavioral: Positive for dysphoric mood.    Last menstrual period  09/30/1991.There is no height or weight on file to calculate BMI.  General Appearance: Casual  Eye Contact:  Fair  Speech:  Clear and Coherent  Volume:  Normal  Mood:  Depressed improving , coping better - crying has improved  Affect:  Congruent  Thought Process:  Goal Directed and Descriptions of Associations: Intact  Orientation:  Full (Time, Place, and Person)  Thought Content: Logical   Suicidal Thoughts:  No  Homicidal Thoughts:  No  Memory:  Immediate;   Fair Recent;   Fair Remote;   Fair  Judgement:  Fair  Insight:  Fair  Psychomotor Activity:  Normal  Concentration:  Concentration: Fair and Attention Span: Fair  Recall:  [employer] of Knowledge: Fair  Language: Fair  Akathisia:  No  Handed:  Right  AIMS (if indicated): UTA  Assets:  Communication Skills Desire for Improvement Housing Social Support  ADL's:  Intact  Cognition: WNL  Sleep:  Fair   Screenings: PHQ2-9     Office Visit from 12/08/2019 in [facility] Procedure visit from 07/24/2017 in [facility] Office Visit from 07/16/2017 in [facility] Procedure visit from 06/28/2017 in [facility] Office Visit from 03/21/2017 in [facility]  PHQ-2 Total Score 0 0 0 0 0       Assessment and Plan: JANIENE AARONS is a 64 year old Caucasian female, widowed, lives in [place], has a history of depression, anxiety, grief, chronic pain, SVT was evaluated by telemedicine today.  Patient continues to grieve the loss of her daughter.  Discussed plan as noted below.  Plan Depression-improving Viibryd 40 mg p.o. daily. Abilify 2 mg p.o. daily   Anxiety disorder-improving Viibryd as prescribed Hydroxyzine 25 mg p.o. 3 times daily as needed  Bereavement-improving Patient will continue to follow-up with her  therapist . Lorazepam 1 mg as needed for severe anxiety agitation or sleep.  She has been limiting use.  Insomnia-stable Mirtazapine 15 mg p.o. nightly  Follow-up in clinic in 4 to 5 weeks or sooner if needed.  I have spent atleast 20 minutes face to face with patient today. More than 50 % of the time was spent for preparing to see the patient ( e.g., review of test, records ), ordering medications and test ,psychoeducation and supportive psychotherapy and care coordination,as well as documenting clinical information in electronic health record. This Wanda [name] was generated in part or whole with voice recognition software. Voice recognition is usually quite accurate but there are transcription errors that can and very often do occur. I apologize for any typographical errors that were not detected and corrected.      Ursula Alert, MD 01/30/2020, 12:35 PM

## 2020-02-16 ENCOUNTER — Ambulatory Visit
Admission: RE | Admit: 2020-02-16 | Discharge: 2020-02-16 | Disposition: A | Discharge status: Home / Self Care | Payer: Medicare HMO | Source: Ambulatory Visit | Attending: Pediatrics | Admitting: Pediatrics

## 2020-02-16 ENCOUNTER — Other Ambulatory Visit: Payer: Self-pay

## 2020-02-16 DIAGNOSIS — Z1231 Encounter for screening mammogram for malignant neoplasm of breast: Secondary | ICD-10-CM | POA: Insufficient documentation

## 2020-02-19 ENCOUNTER — Ambulatory Visit (INDEPENDENT_AMBULATORY_CARE_PROVIDER_SITE_OTHER): Payer: Medicare HMO | Admitting: Licensed Clinical Social Worker

## 2020-02-19 ENCOUNTER — Other Ambulatory Visit: Payer: Self-pay

## 2020-02-19 DIAGNOSIS — F3342 Major depressive disorder, recurrent, in full remission: Secondary | ICD-10-CM | POA: Diagnosis not present

## 2020-02-19 DIAGNOSIS — Z634 Disappearance and death of family member: Secondary | ICD-10-CM

## 2020-02-19 DIAGNOSIS — F411 Generalized anxiety disorder: Secondary | ICD-10-CM

## 2020-02-19 NOTE — Progress Notes (Signed)
Virtual Visit via Video Note  I connected with Wanda [name] on 02/19/20 at  9:00 AM EDT by a video enabled telemedicine application and verified that I am speaking with the correct person using two identifiers.  Location: Patient: home Provider: [facility]   I discussed the limitations of evaluation and management by telemedicine and the availability of in person appointments. The patient expressed understanding and agreed to proceed.  The patient was advised to call back or seek an in-person evaluation if the symptoms worsen or if the condition fails to improve as anticipated.  I provided 55 minutes of non-face-to-face time during this encounter.   Olean Sangster R Lyna Laningham, LCSW    THERAPIST PROGRESS NOTE  Session Time: 9:00-9:55a  Participation Level: Active  Behavioral Response: NAAlertDepressed  Type of Therapy: Individual Therapy  Treatment Goals addressed: Coping  Interventions: CBT, Supportive and Other: grief counseling  Summary: Wanda [name] is a 64 y.o. female who presents with continuing symptoms related to her diagnosis (depression, anxiety, bereavement). Pt reports that she is compliant with medication, slight increase in binge-eating behaviors, and erratic sleep patterns. Pt reports that she is now taking medication to help with sleep. Reviewed sleep hygiene.  Allowed pt to explore and express thoughts and feelings associated with continuing grief. Gave pt unconditional positive support.  Encouraged self care and life balance. Pt reports that she may go on a beach trip with a friend soon.  Suicidal/Homicidal: No  SI, HI, or AVH reported at time of session.   Therapist Response: Wanda [name] reports fluctuating mood symptoms: depression symptoms on some days, anxiety symptoms on other days. Pt reports several binge eating episodes. Current behaviors/mood rating indicate fluctuating/intermittent progress.  Plan: Return again in 4 weeks. Ongoing treatment plan to include  mood management, stress management, emotion regulation, distress tolerance, grief counseling, and behavior modification.  Diagnosis: Axis I: Major depressive disorder, recurrent, moderate; Generalized anxiety disorder, bereavement     Axis II: No diagnosis    Verity Gilcrest Jaeven Wanzer, LCSW 02/19/2020

## 2020-03-12 ENCOUNTER — Other Ambulatory Visit: Payer: Self-pay | Admitting: Psychiatry

## 2020-03-12 DIAGNOSIS — F411 Generalized anxiety disorder: Secondary | ICD-10-CM

## 2020-03-17 ENCOUNTER — Telehealth: Payer: Self-pay | Admitting: Psychiatry

## 2020-03-17 DIAGNOSIS — F411 Generalized anxiety disorder: Secondary | ICD-10-CM

## 2020-03-17 MED ORDER — HYDROXYZINE PAMOATE 25 MG PO CAPS: ORAL_CAPSULE | ORAL | 0 refills | Status: DC

## 2020-03-17 NOTE — Telephone Encounter (Signed)
I have sent hydroxyzine to pharmacy again due to E prescribing error.

## 2020-03-18 ENCOUNTER — Other Ambulatory Visit: Payer: Self-pay

## 2020-03-18 ENCOUNTER — Ambulatory Visit (INDEPENDENT_AMBULATORY_CARE_PROVIDER_SITE_OTHER): Payer: Medicare HMO | Admitting: Licensed Clinical Social Worker

## 2020-03-18 DIAGNOSIS — Z634 Disappearance and death of family member: Secondary | ICD-10-CM

## 2020-03-18 DIAGNOSIS — F411 Generalized anxiety disorder: Secondary | ICD-10-CM

## 2020-03-18 DIAGNOSIS — F331 Major depressive disorder, recurrent, moderate: Secondary | ICD-10-CM | POA: Diagnosis not present

## 2020-03-18 NOTE — Progress Notes (Signed)
Virtual Visit via Telephone Note  I connected with Wanda [name] on 03/18/20 at 11:00 AM EDT by telephone and verified that I am speaking with the correct person using two identifiers.  Location: Patient: home Provider: remote office [place], [place])   I discussed the limitations, risks, security and privacy concerns of performing an evaluation and management service by telephone and the availability of in person appointments. I also discussed with the patient that there may be a patient responsible charge related to this service. The patient expressed understanding and agreed to proceed.   The patient was advised to call back or seek an in-person evaluation if the symptoms worsen or if the condition fails to improve as anticipated.  I provided 45 minutes of non-face-to-face time during this encounter.   Devontae Casasola R Kalayah Leske, LCSW    THERAPIST PROGRESS NOTE  Session Time: 11:00-11:45a  Participation Level: Active  Behavioral Response: NAAlertDepressed  Type of Therapy: Individual Therapy  Treatment Goals addressed: Coping  Interventions: Supportive  Summary: Wanda [name] is a 64 y.o. female who presents with depression and anxiety symptoms. Pt reports episodes of tearfulness, low energy, and anxiety. Pt reports fair sleep quality and quantity.  Allowed pt to explore and express thoughts and feelings associated with stressors. Pt reports that she recently found out the cause of her daughter's death: fentanyl overdose. Pt reports that it was classified as "accidental". Allowed pt safe space to discuss thoughts and feelings that were triggered by this revelation, and how other family members are expressing their thoughts and feelings. Discussion triggered pt to to reminisce about daughter and reflect on their relationship in a positive way.   Encouraged pts continued focus on self care and life balance.   Suicidal/Homicidal: No  SI, HI, or AVH reported at time of  session.  Therapist Response: [place] reports an escalation of depression and anxiety symptoms triggered by recent environmental stresssor. This is indicative of current fluctuating/intermittent progress.  Plan: Return again in 3 weeks. The ongoing treatment plan includes maintaining current levels of progress and continuing to build skills to manage mood, grief counseling, improve stress/anxiety management, emotion regulation, distress tolerance, and behavior modification.    Diagnosis: Axis I: Major depressive disorder, recurrent, moderate; Generalized anxiety disorder, bereavement    Axis II: No diagnosis    Shawnay Bramel Janari Gagner, LCSW 03/18/2020

## 2020-03-19 ENCOUNTER — Telehealth (INDEPENDENT_AMBULATORY_CARE_PROVIDER_SITE_OTHER): Payer: Medicare HMO | Admitting: Psychiatry

## 2020-03-19 ENCOUNTER — Encounter: Payer: Self-pay | Admitting: Psychiatry

## 2020-03-19 DIAGNOSIS — F4010 Social phobia, unspecified: Secondary | ICD-10-CM

## 2020-03-19 DIAGNOSIS — F331 Major depressive disorder, recurrent, moderate: Secondary | ICD-10-CM | POA: Diagnosis not present

## 2020-03-19 DIAGNOSIS — F411 Generalized anxiety disorder: Secondary | ICD-10-CM

## 2020-03-19 MED ORDER — DOXEPIN HCL 10 MG PO CAPS: 10.0000 mg | ORAL_CAPSULE | Freq: Every evening | ORAL | 1 refills | Status: DC | PRN

## 2020-03-19 MED ORDER — LORAZEPAM 1 MG PO TABS: 0.5000 mg | ORAL_TABLET | ORAL | 1 refills | Status: DC

## 2020-03-19 NOTE — Patient Instructions (Signed)
Doxepin capsules What is this medicine? DOXEPIN (DOX e pin) is used to treat depression and anxiety. This medicine may be used for other purposes; ask your health care provider or pharmacist if you have questions. COMMON BRAND NAME(S): Sinequan What should I tell my health care provider before I take this medicine? They need to know if you have any of these conditions:  bipolar disorder  difficulty passing urine  glaucoma  heart disease  if you frequently drink alcohol containing drinks  liver disease  lung or breathing disease, like asthma or sleep apnea  prostate trouble  schizophrenia  seizures  suicidal thoughts, plans, or attempt; a previous suicide attempt by you or a family member  an unusual or allergic reaction to doxepin, other medicines, foods, dyes, or preservatives  pregnant or trying to get pregnant  breast-feeding How should I use this medicine? Take this medicine by mouth with a glass of water. Follow the directions on the prescription label. Take your doses at regular intervals. Do not take your medicine more often than directed. Do not stop taking this medicine suddenly except upon the advice of your doctor. Stopping this medicine too quickly may cause serious side effects or your condition may worsen. A special MedGuide will be given to you by the pharmacist with each prescription and refill. Be sure to read this information carefully each time. Talk to your pediatrician regarding the use of this medicine in children. While this drug may be prescribed for children as young as 12 years for selected conditions, precautions do apply. Overdosage: If you think you have taken too much of this medicine contact a poison control center or emergency room at once. NOTE: This medicine is only for you. Do not share this medicine with others. What if I miss a dose? If you miss a dose, take it as soon as you can. If it is almost time for your next dose, take only that  dose. Do not take double or extra doses. What may interact with this medicine? Do not take this medicine with any of the following medications:  arsenic trioxide  certain medicines used to regulate abnormal heartbeat or to treat other heart conditions  cisapride  halofantrine  levomethadyl  linezolid  MAOIs like Carbex, Eldepryl, Marplan, Nardil, and Parnate  methylene blue  other medicines for mental depression  phenothiazines like perphenazine, thioridazine and chlorpromazine  pimozide  procarbazine  sparfloxacin  St. John's Wort This medicine may also interact with the following medications:  cimetidine  tolazamide  ziprasidone This list may not describe all possible interactions. Give your health care provider a list of all the medicines, herbs, non-prescription drugs, or dietary supplements you use. Also tell them if you smoke, drink alcohol, or use illegal drugs. Some items may interact with your medicine. What should I watch for while using this medicine? Visit your doctor or health care professional for regular checks on your progress. It can take several days before you feel the full effect of this medicine. If you have been taking this medicine regularly for some time, do not suddenly stop taking it. You must gradually reduce the dose or you may get severe side effects. Ask your doctor or health care professional for advice. Even after you stop taking this medicine it can still affect your body for several days. Patients and their families should watch out for new or worsening thoughts of suicide or depression. Also watch out for sudden changes in feelings such as feeling anxious, agitated, panicky,   irritable, hostile, aggressive, impulsive, severely restless, overly excited and hyperactive, or not being able to sleep. If this happens, especially at the beginning of treatment or after a change in dose, call your health care professional. Dennis Bast may get drowsy or  dizzy. Do not drive, use machinery, or do anything that needs mental alertness until you know how this medicine affects you. Do not stand or sit up quickly, especially if you are an older patient. This reduces the risk of dizzy or fainting spells. Alcohol may increase dizziness and drowsiness. Avoid alcoholic drinks. Do not treat yourself for coughs, colds, or allergies without asking your doctor or health care professional for advice. Some ingredients can increase possible side effects. Your mouth may get dry. Chewing sugarless gum or sucking hard candy, and drinking plenty of water may help. Contact your doctor if the problem does not go away or is severe. This medicine may cause dry eyes and blurred vision. If you wear contact lenses you may feel some discomfort. Lubricating drops may help. See your eye doctor if the problem does not go away or is severe. This medicine can make you more sensitive to the sun. Keep out of the sun. If you cannot avoid being in the sun, wear protective clothing and use sunscreen. Do not use sun lamps or tanning beds/booths. What side effects may I notice from receiving this medicine? Side effects that you should report to your doctor or health care professional as soon as possible:  allergic reactions like skin rash, itching or hives, swelling of the face, lips, or tongue  anxious  breathing problems  changes in vision  confusion  elevated mood, decreased need for sleep, racing thoughts, impulsive behavior  eye pain  fast, irregular heartbeat  feeling faint or lightheaded, falls  feeling agitated, angry, or irritable  fever with increased sweating  hallucination, loss of contact with reality  seizures  stiff muscles  suicidal thoughts or other mood changes  tingling, pain, or numbness in the feet or hands  trouble passing urine or change in the amount of urine  trouble sleeping  unusually weak or tired  vomiting  yellowing of the eyes  or skin Side effects that usually do not require medical attention (report to your doctor or health care professional if they continue or are bothersome):  change in sex drive or performance  change in appetite or weight  constipation  dizziness  dry mouth  nausea  tired  tremors  upset stomach This list may not describe all possible side effects. Call your doctor for medical advice about side effects. You may report side effects to FDA at 1-800-FDA-1088. Where should I keep my medicine? Keep out of the reach of children. Store at room temperature between 15 and 30 degrees C (59 and 86 degrees F). Throw away any unused medicine after the expiration date. NOTE: This sheet is a summary. It may not cover all possible information. If you have questions about this medicine, talk to your doctor, pharmacist, or health care provider.  2020 Elsevier/Gold Standard (2018-05-07 13:13:29)

## 2020-03-19 NOTE — Progress Notes (Signed)
Virtual Visit via Telephone Note  I connected with Wanda [name] on 03/19/20 at 11:00 AM EDT by telephone and verified that I am speaking with the correct person using two identifiers.  Location Provider Location : [facility] Patient Location : Home  Participants: Patient , Provider    I discussed the limitations, risks, security and privacy concerns of performing an evaluation and management service by telephone and the availability of in person appointments. I also discussed with the patient that there may be a patient responsible charge related to this service. The patient expressed understanding and agreed to proceed.    I discussed the assessment and treatment plan with the patient. The patient was provided an opportunity to ask questions and all were answered. The patient agreed with the plan and demonstrated an understanding of the instructions.   The patient was advised to call back or seek an in-person evaluation if the symptoms worsen or if the condition fails to improve as anticipated.   [place] MD OP Progress Note  03/19/2020 12:02 PM [name]  MRN:  [id number]  Chief Complaint:  Chief Complaint    Follow-up     HPI: Wanda [name] is a 64 year old Caucasian female, widowed, on SSI, lives in [place], has a history of GAD, MDD, social anxiety disorder, SVT, orthostatic hypotension, hyperlipidemia, bereavement was evaluated by phone today.  Patient continues to grieve the loss of her daughter who passed away.  She reports she was able to get a report about her daughter's death and found out that she died from an accidental overdose on opioids.  Patient became very tearful when she discussed this.  She reports although she is grieving she is able to cope with it.  She reports however when she talks about her daughter she cannot hold her emotions.  She however reports therapy sessions as beneficial.  She also reports the current medications is helpful and denies any significant  depressive symptoms.  She reports however she is struggling with sleep.  She has been taking lorazepam every night since the past few weeks.  The mirtazapine is not beneficial anymore.  Patient denies any suicidality, homicidality or perceptual disturbances.  She looks forward to spending time with her grand daughter for her birthday party.  Patient denies any other concerns today.  Visit Diagnosis:    ICD-10-CM   1. GAD (generalized anxiety disorder)  F41.1 LORazepam (ATIVAN) 1 MG tablet  2. MDD (major depressive disorder), recurrent episode, moderate (HCC)  F33.1 doxepin (SINEQUAN) 10 MG capsule  3. Social anxiety disorder  F40.10     Past Psychiatric History: I have reviewed past psychiatric history from my progress note on 08/21/2017.  Past trials of Zoloft, Cymbalta, Effexor, Paxil, Prozac, Wellbutrin, Xanax, prazosin, Seroquel, trazodone, doxepin  Past Medical History:  Past Medical History:  Diagnosis Date  . Anxiety   . Borderline diabetes   . BRONCHITIS, ACUTE WITH BRONCHOSPASM 06/25/2010   Qualifier: Diagnosis of  By: Alveta Heimlich MD, Cornelia Copa    . Chronic prescription benzodiazepine use 10/18/2015  . Depression   . Dysrhythmia    tachycardia  . GASTROENTERITIS WITHOUT DEHYDRATION 05/14/2009   Qualifier: Diagnosis of  By: Deborra Medina MD, Tanja Port    . GERD (gastroesophageal reflux disease)   . Herpes   . 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 History of kidney stones   . Hyperlipidemia   . Hypertension   . Irregular heart beat   . Pre-diabetes   . Sinus tachycardia  04/11/2017  . Skin cancer of face 08/01/2018  . 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 Dossie Arbour Serial number: [id number] Model number: [id number]     Past Surgical History:  Procedure Laterality Date  . ABDOMINAL HYSTERECTOMY    . APPENDECTOMY    . BACK SURGERY     X 3  . BREAST EXCISIONAL BIOPSY Right     surgical bx age 66   . COLONOSCOPY N/A 08/28/2013   Procedure: COLONOSCOPY;  Surgeon: Danie Binder, MD;  Location: [facility];  Service: Endoscopy;  Laterality: N/A;  8:30 AM  . INTRAMEDULLARY (IM) NAIL INTERTROCHANTERIC Left 05/30/2016   Procedure: INTRAMEDULLARY (IM) NAIL INTERTROCHANTRIC;  Surgeon: Thornton Park, MD;  Location: [facility] ORS;  Service: Orthopedics;  Laterality: Left;  . SHOULDER SURGERY Right   . Spinal cord stimulator    . SPINAL CORD STIMULATOR REMOVAL N/A 01/12/2020   Procedure: REMOVAL SPINAL CORD STIMULATOR AND PULSE GENERATOR;  Surgeon: Deetta Perla, MD;  Location: [facility] ORS;  Service: Neurosurgery;  Laterality: N/A;  Local w/ MAC    Family Psychiatric History: I have reviewed family psychiatric history from my progress note on 08/21/2017  Family History:  Family History  Problem Relation Age of Onset  . Diabetes Mother   . COPD Mother   . Heart disease Mother   . Diabetes Father   . COPD Father   . Heart disease Father   . Heart attack Father 49       CABG  . Alcohol abuse Daughter   . Depression Daughter   . Drug abuse Daughter   . Anxiety disorder Daughter   . Tuberculosis Maternal Aunt   . Cancer Paternal Aunt        breast  . Breast cancer Paternal Aunt   . Diabetes Paternal Grandmother   . Diabetes Sister   . Cancer Cousin   . Diabetes Cousin   . Colon cancer Neg Hx   . Sudden Cardiac Death Neg Hx     Social History: Reviewed social history from my progress note from 08/21/2017 Social History   Socioeconomic History  . Marital status: Divorced    Spouse name: Not on file  . Number of children: 2  . Years of education: Not on file  . Highest education level: High school graduate  Occupational History    Comment: disabled  Tobacco Use  . Smoking status: Former Smoker    Packs/day: 0.25    Years: 14.00    Pack years: 3.50    Types: Cigarettes    Quit date: 1992    Years since quitting: 29.8  . Smokeless tobacco: Never Used  Vaping Use   . Vaping Use: Never used  Substance and Sexual Activity  . Alcohol use: Yes    Comment: occasional; once every 6 months  . Drug use: No  . Sexual activity: Yes    Birth control/protection: Surgical  Other Topics Concern  . Not on file  Social History Narrative  . Not on file   Social Determinants of Health   Financial Resource Strain:   . Difficulty of Paying Living Expenses: Not on file  Food Insecurity:   . Worried About Charity fundraiser in the Last Year: Not on file  . Ran Out of Food in the Last Year: Not on file  Transportation Needs:   . Lack of Transportation (Medical): Not on file  . Lack of Transportation (Non-Medical): Not  on file  Physical Activity:   . Days of Exercise per Week: Not on file  . Minutes of Exercise per Session: Not on file  Stress:   . Feeling of Stress : Not on file  Social Connections:   . Frequency of Communication with Friends and Family: Not on file  . Frequency of Social Gatherings with Friends and Family: Not on file  . Attends Religious Services: Not on file  . Active Member of Clubs or Organizations: Not on file  . Attends Archivist Meetings: Not on file  . Marital Status: Not on file    Allergies:  Allergies  Allergen Reactions  . Levofloxacin Hives and Nausea And Vomiting  . Tramadol Other (See Comments)    Severe headache  . Codeine Nausea Only    Slight nausea  . Mobic [Meloxicam] Other (See Comments)    Calf tightness  . Propoxyphene Hcl Rash    Metabolic Disorder Labs: Lab Results  Component Value Date   HGBA1C 5.4 05/29/2016   MPG 108 05/29/2016   No results found for: PROLACTIN Lab Results  Component Value Date   CHOL 198 07/08/2018   TRIG 371 (H) 07/08/2018   HDL 46 07/08/2018   CHOLHDL 4.3 07/08/2018   VLDL 59 (H) 09/29/2016   LDLCALC 78 07/08/2018   LDLCALC 106 (H) 09/29/2016   Lab Results  Component Value Date   TSH 2.560 07/08/2018   TSH 2.050 10/18/2016    Therapeutic Level  Labs: No results found for: LITHIUM No results found for: VALPROATE No components found for:  CBMZ  Current Medications: Current Outpatient Medications  Medication Sig Dispense Refill  . ARIPiprazole (ABILIFY) 5 MG tablet TAKE 1 TABLET(5 MG) BY MOUTH DAILY 90 tablet 0  . atorvastatin (LIPITOR) 40 MG tablet Take 40 mg by mouth daily. PM    . cetirizine (ZYRTEC) 10 MG tablet Take 10 mg by mouth daily.     . Cholecalciferol (VITAMIN D3) 5000 units TABS Take 5,000 Units by mouth daily.    Marland Kitchen dexlansoprazole (DEXILANT) 60 MG capsule Take 60 mg by mouth daily.     Marland Kitchen doxepin (SINEQUAN) 10 MG capsule Take 1-2 capsules (10-20 mg total) by mouth at bedtime as needed. FOR SLEEP 30 capsule 1  . famotidine (PEPCID) 40 MG tablet Take 40 mg by mouth daily.     . hydrOXYzine (VISTARIL) 25 MG capsule TAKE 1 CAPSULE BY MOUTH THREE TIMES DAILY AS NEEDED FOR SEVERE ANXIETY ATTACKS ONLY 90 capsule 0  . linaclotide (LINZESS) 72 MCG capsule Take 72 mcg by mouth daily before breakfast. PRN    . LORazepam (ATIVAN) 1 MG tablet Take 0.5 tablets (0.5 mg total) by mouth as directed. Take half tablet ( 0.5 mg) as needed during the day for severe anxiety attacks and half tablet ( 0.5 mg) at bedtime for severe anxiety attacks and sleep. 24 tablet 1  . metoprolol succinate (TOPROL-XL) 25 MG 24 hr tablet TAKE 1 TABLET(25 MG) BY MOUTH DAILY 90 tablet 2  . Multiple Vitamin (MULTIVITAMIN) tablet Take 1 tablet by mouth daily.    . naloxone (NARCAN) 0.4 MG/ML injection     . Naloxone HCl 0.4 MG/0.4ML SOAJ Take if having symptoms of opioid overdose including extreme sleepiness or stopping breathing 0.4 mL 0  . potassium chloride SA (KLOR-CON) 20 MEQ tablet Take 2 tablets (40 mEq total) by mouth daily. 180 tablet 3  . risedronate (ACTONEL) 35 MG tablet Take 35 mg by mouth every 7 (seven) days.     Marland Kitchen  Vilazodone HCl (VIIBRYD) 40 MG TABS DAILY 30 tablet 3   No current facility-administered medications for this visit.      Musculoskeletal: Strength & Muscle Tone: UTA Gait & Station: UTA Patient leans: N/A  Psychiatric Specialty Exam: Review of Systems  Psychiatric/Behavioral: Positive for dysphoric mood and sleep disturbance.  All other systems reviewed and are negative.   Last menstrual period 09/30/1991.There is no height or weight on file to calculate BMI.  General Appearance: UTA  Eye Contact:  UTA  Speech:  Clear and Coherent  Volume:  Normal  Mood:  Depressed  Affect:  Tearful  Thought Process:  Goal Directed and Descriptions of Associations: Intact  Orientation:  Full (Time, Place, and Person)  Thought Content: Logical   Suicidal Thoughts:  No  Homicidal Thoughts:  No  Memory:  Immediate;   Fair Recent;   Fair Remote;   Fair  Judgement:  Fair  Insight:  Fair  Psychomotor Activity:  UTA  Concentration:  Concentration: Fair and Attention Span: Fair  Recall:  [employer] of Knowledge: Fair  Language: Fair  Akathisia:  No  Handed:  Right  AIMS (if indicated): UTA  Assets:  Communication Skills Desire for Improvement Housing  ADL's:  Intact  Cognition: WNL  Sleep:  Poor   Screenings: PHQ2-9     Office Visit from 12/08/2019 in [place] Procedure visit from 07/24/2017 in [place] Office Visit from 07/16/2017 in [place] Procedure visit from 06/28/2017 in [place] Office Visit from 03/21/2017 in [facility]  PHQ-2 Total Score 0 0 0 0 0       Assessment and Plan: Wanda [name]  Is a 64 year old Caucasian female, widowed, lives in [place], has a history of depression, anxiety, chronic pain, SVT was evaluated by telemedicine today.  Patient continues to grieve the loss of her daughter and is currently struggling with sleep when she does not take lorazepam at  night.  Discussed plan as noted below.  Plan Depression-unstable Viibryd 40 mg daily Abilify 5 mg p.o. daily Discontinue mirtazapine for lack of benefit Start Doxepin 10-20 mg po qhs prn for sleep  Anxiety disorder-improving Viibryd as prescribed Continue lorazepam as needed however advised patient to limit use.  Discussed long-term risk factors. I have reviewed [place] controlled substance database. Continue CBT  Insomnia-unstable Discontinue mirtazapine. Start Doxepin 10 - 20 mg po qhs prn for sleep.   Follow-up in clinic in 3 to 4 weeks or sooner if needed.  I have spent atleast 20 minutes non face to face with patient today. More than 50 % of the time was spent for preparing to see the patient ( e.g., review of test, records ),ordering medications and test ,psychoeducation and supportive psychotherapy and care coordination,as well as documenting clinical information in electronic health record. This note was generated in part or whole with voice recognition software. Voice recognition is usually quite accurate but there are transcription errors that can and very often do occur. I apologize for any typographical errors that were not detected and corrected.      Ursula Alert, MD 03/19/2020, 12:02 PM

## 2020-04-05 ENCOUNTER — Other Ambulatory Visit: Payer: Self-pay | Admitting: Internal Medicine

## 2020-04-08 ENCOUNTER — Other Ambulatory Visit: Payer: Self-pay

## 2020-04-08 ENCOUNTER — Ambulatory Visit (INDEPENDENT_AMBULATORY_CARE_PROVIDER_SITE_OTHER): Payer: Medicare HMO | Admitting: Licensed Clinical Social Worker

## 2020-04-08 DIAGNOSIS — F331 Major depressive disorder, recurrent, moderate: Secondary | ICD-10-CM | POA: Diagnosis not present

## 2020-04-08 NOTE — Progress Notes (Signed)
Virtual Visit via Telephone Note  I connected with [name] on 04/08/20 at  1:30 PM EST by telephone and verified that I am speaking with the correct person using two identifiers.  Location: Patient: home Provider: [facility]   I discussed the limitations, risks, security and privacy concerns of performing an evaluation and management service by telephone and the availability of in person appointments. I also discussed with the patient that there may be a patient responsible charge related to this service. The patient expressed understanding and agreed to proceed.  The patient was advised to call back or seek an in-person evaluation if the symptoms worsen or if the condition fails to improve as anticipated.  I provided 30 minutes of non-face-to-face time during this encounter.   Kristene Liberati R Joyceline Maiorino, LCSW   THERAPIST PROGRESS NOTE  Session Time: 1:30-2:00p  Participation Level: Active  Behavioral Response: NAAlertDepressed  Type of Therapy: Individual Therapy  Treatment Goals addressed: Coping  Interventions: CBT and Supportive  Summary: Wanda [name] is a 64 y.o. female who presents with symptoms related to depression and anxiety diagnoses. Pt reports that she feels a lack of motivation and initiative. Pt reports that she is compliant with medication and gets fair quality and quantity of sleep.  Allowed pt to explore and express thoughts and feelings about recent external stressors. Pt feels that she is just feeling more depressed recently and feels it may be the time change.   Used CBT-based interventions to help pt manage mood: encouraged daily structure, open up windows to let daylight in, encouraged cognitively-stimulating activities (novel), positive behavioral activities, and positive social engagement.  Reviewed importance of overall self care and life balance.   Suicidal/Homicidal: No  SI, HI, or AVH reported at time of session.  Therapist Response: Nethra continues  to develop capacity for self care and life management. Pt continues to gain mastery over traumatic stimuli and fears. Pt managing grief well. Treatment showing good evolution and development.   Plan: Return again in 3 weeks. The ongoing treatment plan includes maintaining current levels of progress and continuing to build skills to manage mood, improve stress/anxiety management, emotion regulation, distress tolerance, and behavior modification.   Diagnosis: Axis I: Major Depression, Recurrent moderate    Axis II: No diagnosis    [place], LCSW 04/08/2020

## 2020-04-16 ENCOUNTER — Encounter: Payer: Self-pay | Admitting: Psychiatry

## 2020-04-16 ENCOUNTER — Telehealth (INDEPENDENT_AMBULATORY_CARE_PROVIDER_SITE_OTHER): Payer: Medicare HMO | Admitting: Psychiatry

## 2020-04-16 ENCOUNTER — Other Ambulatory Visit: Payer: Self-pay

## 2020-04-16 DIAGNOSIS — F411 Generalized anxiety disorder: Secondary | ICD-10-CM

## 2020-04-16 DIAGNOSIS — F331 Major depressive disorder, recurrent, moderate: Secondary | ICD-10-CM | POA: Diagnosis not present

## 2020-04-16 DIAGNOSIS — F4010 Social phobia, unspecified: Secondary | ICD-10-CM

## 2020-04-16 MED ORDER — VIIBRYD 40 MG PO TABS: ORAL_TABLET | ORAL | 3 refills | Status: DC

## 2020-04-16 NOTE — Progress Notes (Signed)
Virtual Visit via Telephone Note  I connected with Wanda [name] on 04/16/20 at 11:00 AM EST by telephone and verified that I am speaking with the correct person using two identifiers. Location Provider Location : [facility] Patient Location : Home  Participants: Patient , Provider   I discussed the limitations, risks, security and privacy concerns of performing an evaluation and management service by telephone and the availability of in person appointments. I also discussed with the patient that there may be a patient responsible charge related to this service. The patient expressed understanding and agreed to proceed.  I discussed the assessment and treatment plan with the patient. The patient was provided an opportunity to ask questions and all were answered. The patient agreed with the plan and demonstrated an understanding of the instructions.  The patient was advised to call back or seek an in-person evaluation if the symptoms worsen or if the condition fails to improve as anticipated.  [facility] MD OP Progress Note  04/16/2020 12:07 PM Wanda [name]  MRN:  [id number]  Chief Complaint:  Chief Complaint    Follow-up     HPI: Wanda [name] is a 64 year old Caucasian female, widowed, on SSI, lives in [place], has a history of GAD, MDD, social anxiety disorder, SVT, orthostatic hypotension, hyperlipidemia, bereavement, was evaluated by phone today.  Patient continues to grieve the loss of her daughter who passed away recently.  Patient however reports she is coping better than before.  She reports she was able to spend some time with her granddaughter on her birthday recently.  She does not know if she will be able to meet her on Thanksgiving however is looking forward to that.  She reports she does struggle with sleep problems on and off.  She reports she is trying to manage it by working on her sleep hygiene and other behavioral techniques.  She is not interested in making any changes with  her sleep medication today.  She does use doxepin as well as melatonin over-the-counter.  Patient reports mood wise she is coping better.  Denies any significant anxiety attacks.  She is compliant on her medications.  She denies any suicidality, homicidality or perceptual disturbances.  Patient denies any other concerns today.  Visit Diagnosis:    ICD-10-CM   1. GAD (generalized anxiety disorder)  F41.1   2. MDD (major depressive disorder), recurrent episode, moderate (HCC)  F33.1 Vilazodone HCl (VIIBRYD) 40 MG TABS  3. Social anxiety disorder  F40.10     Past Psychiatric History: I have reviewed past psychiatric history from my progress note on 08/21/2017 past trials of Zoloft, Cymbalta, Effexor, Paxil, Prozac, Wellbutrin, Xanax, prazosin, Seroquel, trazodone, doxepin  Past Medical History:  Past Medical History:  Diagnosis Date  . Anxiety   . Borderline diabetes   . BRONCHITIS, ACUTE WITH BRONCHOSPASM 06/25/2010   Qualifier: Diagnosis of  By: Alveta Heimlich MD, Cornelia Copa    . Chronic prescription benzodiazepine use 10/18/2015  . Depression   . Dysrhythmia    tachycardia  . GASTROENTERITIS WITHOUT DEHYDRATION 05/14/2009   Qualifier: Diagnosis of  By: Deborra Medina MD, Tanja Port    . GERD (gastroesophageal reflux disease)   . Herpes   . 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 History of kidney stones   . Hyperlipidemia   . Hypertension   . Irregular heart beat   . Pre-diabetes   . Sinus tachycardia 04/11/2017  . Skin cancer of face 08/01/2018  . 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 Dossie Arbour Serial number: [id number] Model number: [id number]     Past Surgical History:  Procedure Laterality Date  . ABDOMINAL HYSTERECTOMY    . APPENDECTOMY    . BACK SURGERY     X 3  . BREAST EXCISIONAL BIOPSY Right    surgical bx age 1   . COLONOSCOPY N/A 08/28/2013   Procedure: COLONOSCOPY;  Surgeon: Danie Binder, MD;  Location: [facility];  Service: Endoscopy;  Laterality: N/A;  8:30 AM  . INTRAMEDULLARY (IM) NAIL INTERTROCHANTERIC Left 05/30/2016   Procedure: INTRAMEDULLARY (IM) NAIL INTERTROCHANTRIC;  Surgeon: Thornton Park, MD;  Location: [facility] ORS;  Service: Orthopedics;  Laterality: Left;  . SHOULDER SURGERY Right   . Spinal cord stimulator    . SPINAL CORD STIMULATOR REMOVAL N/A 01/12/2020   Procedure: REMOVAL SPINAL CORD STIMULATOR AND PULSE GENERATOR;  Surgeon: Deetta Perla, MD;  Location: [facility] ORS;  Service: Neurosurgery;  Laterality: N/A;  Local w/ MAC    Family Psychiatric History: I have reviewed family psychiatric history from my progress note on 08/21/2017  Family History:  Family History  Problem Relation Age of Onset  . Diabetes Mother   . COPD Mother   . Heart disease Mother   . Diabetes Father   . COPD Father   . Heart disease Father   . Heart attack Father 70       CABG  . Alcohol abuse Daughter   . Depression Daughter   . Drug abuse Daughter   . Anxiety disorder Daughter   . Tuberculosis Maternal Aunt   . Cancer Paternal Aunt        breast  . Breast cancer Paternal Aunt   . Diabetes Paternal Grandmother   . Diabetes Sister   . Cancer Cousin   . Diabetes Cousin   . Colon cancer Neg Hx   . Sudden Cardiac Death Neg Hx     Social History: Reviewed social history from my progress note on 08/21/2017 Social History   Socioeconomic History  . Marital status: Divorced    Spouse name: Not on file  . Number of children: 2  . Years of education: Not on file  . Highest education level: High school graduate  Occupational History    Comment: disabled  Tobacco Use  . Smoking status: Former Smoker    Packs/day: 0.25    Years: 14.00    Pack years: 3.50    Types: Cigarettes    Quit date: 1992    Years since quitting: 29.9  . Smokeless tobacco: Never Used  Vaping Use  . Vaping Use: Never used  Substance and Sexual Activity  . Alcohol use: Yes    Comment:  occasional; once every 6 months  . Drug use: No  . Sexual activity: Yes    Birth control/protection: Surgical  Other Topics Concern  . Not on file  Social History Narrative  . Not on file   Social Determinants of Health   Financial Resource Strain:   . Difficulty of Paying Living Expenses: Not on file  Food Insecurity:   . Worried About Charity fundraiser in the Last Year: Not on file  . Ran Out of Food in the Last Year: Not on file  Transportation Needs:   . Lack of Transportation (Medical): Not on file  . Lack of Transportation (Non-Medical): Not on file  Physical Activity:   . Days of Exercise per Week: Not on  file  . Minutes of Exercise per Session: Not on file  Stress:   . Feeling of Stress : Not on file  Social Connections:   . Frequency of Communication with Friends and Family: Not on file  . Frequency of Social Gatherings with Friends and Family: Not on file  . Attends Religious Services: Not on file  . Active Member of Clubs or Organizations: Not on file  . Attends Archivist Meetings: Not on file  . Marital Status: Not on file    Allergies:  Allergies  Allergen Reactions  . Levofloxacin Hives and Nausea And Vomiting  . Tramadol Other (See Comments)    Severe headache  . Codeine Nausea Only    Slight nausea  . Mobic [Meloxicam] Other (See Comments)    Calf tightness  . Propoxyphene Hcl Rash    Metabolic Disorder Labs: Lab Results  Component Value Date   HGBA1C 5.4 05/29/2016   MPG 108 05/29/2016   No results found for: PROLACTIN Lab Results  Component Value Date   CHOL 198 07/08/2018   TRIG 371 (H) 07/08/2018   HDL 46 07/08/2018   CHOLHDL 4.3 07/08/2018   VLDL 59 (H) 09/29/2016   LDLCALC 78 07/08/2018   LDLCALC 106 (H) 09/29/2016   Lab Results  Component Value Date   TSH 2.560 07/08/2018   TSH 2.050 10/18/2016    Therapeutic Level Labs: No results found for: LITHIUM No results found for: VALPROATE No components found for:   CBMZ  Current Medications: Current Outpatient Medications  Medication Sig Dispense Refill  . ARIPiprazole (ABILIFY) 5 MG tablet TAKE 1 TABLET(5 MG) BY MOUTH DAILY 90 tablet 0  . atorvastatin (LIPITOR) 40 MG tablet Take 40 mg by mouth daily. PM    . cetirizine (ZYRTEC) 10 MG tablet Take 10 mg by mouth daily.     . Cholecalciferol (VITAMIN D3) 5000 units TABS Take 5,000 Units by mouth daily.    Marland Kitchen dexlansoprazole (DEXILANT) 60 MG capsule Take 60 mg by mouth daily.     Marland Kitchen doxepin (SINEQUAN) 10 MG capsule Take 1-2 capsules (10-20 mg total) by mouth at bedtime as needed. FOR SLEEP 30 capsule 1  . famotidine (PEPCID) 40 MG tablet Take 40 mg by mouth daily.     . hydrOXYzine (VISTARIL) 25 MG capsule TAKE 1 CAPSULE BY MOUTH THREE TIMES DAILY AS NEEDED FOR SEVERE ANXIETY ATTACKS ONLY 90 capsule 0  . linaclotide (LINZESS) 72 MCG capsule Take 72 mcg by mouth daily before breakfast. PRN    . LORazepam (ATIVAN) 1 MG tablet Take 0.5 tablets (0.5 mg total) by mouth as directed. Take half tablet ( 0.5 mg) as needed during the day for severe anxiety attacks and half tablet ( 0.5 mg) at bedtime for severe anxiety attacks and sleep. 24 tablet 1  . metoprolol succinate (TOPROL-XL) 25 MG 24 hr tablet TAKE 1 TABLET(25 MG) BY MOUTH DAILY 90 tablet 2  . Multiple Vitamin (MULTIVITAMIN) tablet Take 1 tablet by mouth daily.    . naloxone (NARCAN) 0.4 MG/ML injection     . Naloxone HCl 0.4 MG/0.4ML SOAJ Take if having symptoms of opioid overdose including extreme sleepiness or stopping breathing 0.4 mL 0  . potassium chloride SA (KLOR-CON) 20 MEQ tablet TAKE 2 TABLETS(40 MEQ) BY MOUTH DAILY 180 tablet 3  . risedronate (ACTONEL) 35 MG tablet Take 35 mg by mouth every 7 (seven) days.     . Vilazodone HCl (VIIBRYD) 40 MG TABS DAILY 30 tablet 3  No current facility-administered medications for this visit.     Musculoskeletal: Strength & Muscle Tone: UTA Gait & Station: UTA Patient leans: N/A  Psychiatric Specialty  Exam: Review of Systems  Psychiatric/Behavioral: Positive for dysphoric mood and sleep disturbance.  All other systems reviewed and are negative.   Last menstrual period 09/30/1991.There is no height or weight on file to calculate BMI.  General Appearance: UTA  Eye Contact:  UTA  Speech:  Clear and Coherent  Volume:  Normal  Mood:  Dysphoric improving  Affect:  UTA  Thought Process:  Goal Directed and Descriptions of Associations: Intact  Orientation:  Full (Time, Place, and Person)  Thought Content: Logical   Suicidal Thoughts:  No  Homicidal Thoughts:  No  Memory:  Immediate;   Fair Recent;   Fair Remote;   Fair  Judgement:  Fair  Insight:  Fair  Psychomotor Activity:  UTA  Concentration:  Concentration: Fair and Attention Span: Fair  Recall:  [employer] of Knowledge: Fair  Language: Fair  Akathisia:  No  Handed:  Right  AIMS (if indicated): UTA  Assets:  Communication Skills Desire for Improvement Housing Transportation  ADL's:  Intact  Cognition: WNL  Sleep:  poor   Screenings: PHQ2-9     Video Visit from 04/16/2020 in [facility] Office Visit from 12/08/2019 in [facility] Procedure visit from 07/24/2017 in [facility] Visit from 07/16/2017 in [facility] Procedure visit from 06/28/2017 in [facility]  PHQ-2 Total Score 2 0 0 0 0  PHQ-9 Total Score 10 -- -- -- --       Assessment and Plan: Wanda [name] is a 64 year old Caucasian female, widowed, lives in [place], has a history of depression, anxiety, chronic pain, SVT was evaluated by telemedicine today.  Patient continues to grieve the loss of her daughter.  Patient does have sleep issues however is not interested in medication changes and wants to make use of behavioral techniques and sleep  hygiene.  Plan Depression-improving Viibryd 40 mg p.o. daily Abilify 5 mg p.o. daily Doxepin 10 to 20 mg p.o. nightly as needed for sleep Advised patient to increase melatonin to 10 mg p.o. nightly. Continue sleep hygiene techniques  GAD-improving Viibryd as prescribed Lorazepam as needed for severe panic symptoms.  Patient advised to limit use. Continue CBT  Insomnia-unstable Doxepin 20 mg p.o. nightly as needed Increase melatonin to 10 mg p.o. nightly  Follow-up in clinic in 4 weeks or sooner if needed.  I have spent atleast 20 minutes non face to face  with patient today. More than 50 % of the time was spent for preparing to see the patient ( e.g., review of test, records ), ordering medications and test ,psychoeducation and supportive psychotherapy and care coordination,as well as documenting clinical information in electronic health record. This note was generated in part or whole with voice recognition software. Voice recognition is usually quite accurate but there are transcription errors that can and very often do occur. I apologize for any typographical errors that were not detected and corrected.        Ursula Alert, MD 04/16/2020, 12:07 PM

## 2020-04-27 ENCOUNTER — Other Ambulatory Visit: Payer: Self-pay | Admitting: Psychiatry

## 2020-04-27 DIAGNOSIS — F411 Generalized anxiety disorder: Secondary | ICD-10-CM

## 2020-05-04 ENCOUNTER — Other Ambulatory Visit: Payer: Self-pay

## 2020-05-04 ENCOUNTER — Ambulatory Visit (INDEPENDENT_AMBULATORY_CARE_PROVIDER_SITE_OTHER): Payer: Medicare HMO | Admitting: Licensed Clinical Social Worker

## 2020-05-04 DIAGNOSIS — F411 Generalized anxiety disorder: Secondary | ICD-10-CM

## 2020-05-04 DIAGNOSIS — F331 Major depressive disorder, recurrent, moderate: Secondary | ICD-10-CM | POA: Diagnosis not present

## 2020-05-04 NOTE — Progress Notes (Signed)
Virtual Visit via Telephone Note  I connected with YANETT CONKRIGHT on 05/04/20 at  1:30 PM EST by telephone and verified that I am speaking with the correct person using two identifiers.  Location: Patient: home Provider: [facility]   I discussed the limitations, risks, security and privacy concerns of performing an evaluation and management service by telephone and the availability of in person appointments. I also discussed with the patient that there may be a patient responsible charge related to this service. The patient expressed understanding and agreed to proceed.  The patient was advised to call back or seek an in-person evaluation if the symptoms worsen or if the condition fails to improve as anticipated.  I provided 45 minutes of non-face-to-face time during this encounter.   Jalea Bronaugh R Key Cen, LCSW   THERAPIST PROGRESS NOTE  Session Time: 1:30-2:15p  Participation Level: Active  Behavioral Response: NAAlertDepressed  Type of Therapy: Individual Therapy  Treatment Goals addressed: Anxiety and Coping  Interventions: CBT, Supportive, Reframing and Other: grief counseling  Summary: AVEYA BEAL is a 64 y.o. female who presents with symptoms consistent with depression and anxiety diagnosis. Pt reports that she has insomnia over half of the nights in any given week. Leith reports that her mood is stable and that she is managing stress/anxiety better.   Allowed pt to explore and express thoughts and feelings about current family relationships. Pt wants to maintain and build relationship with granddaughter, but her father is not answering his phone and not talking with Otila Kluver. Tamkia reports "I need his permission" anytime that she would think about coming for a visit. Pt misses granddaughter and wants to spend time with her over the holiday. Pt reports that SIL lost his mother one week after losing his wife.   Discussed self care strategies and ways that pt could incorporate more  stress-reduction and mindfulness activities into daily routine.  Suicidal/Homicidal: No  Therapist Response: Lennie reports improvements in overall mood regulation and distress tolerance. Pt continues to have intermittent crying spells. Pt is displaying progress. Treatment to continue as indicated.   Plan: Return again in 3 weeks.  Diagnosis: Axis I: Major depressive disorder, recurrent, moderate; Generalized anxiety disorder    Axis II: No diagnosis    Sherisse Fullilove Cleatus Gabriel, LCSW 05/04/2020

## 2020-05-13 ENCOUNTER — Ambulatory Visit: Payer: Medicare HMO | Admitting: Dermatology

## 2020-05-13 ENCOUNTER — Encounter: Payer: Self-pay | Admitting: Dermatology

## 2020-05-13 ENCOUNTER — Other Ambulatory Visit: Payer: Self-pay

## 2020-05-13 DIAGNOSIS — Z85828 Personal history of other malignant neoplasm of skin: Secondary | ICD-10-CM | POA: Diagnosis not present

## 2020-05-13 DIAGNOSIS — L820 Inflamed seborrheic keratosis: Secondary | ICD-10-CM | POA: Diagnosis not present

## 2020-05-13 DIAGNOSIS — D1801 Hemangioma of skin and subcutaneous tissue: Secondary | ICD-10-CM

## 2020-05-13 DIAGNOSIS — L578 Other skin changes due to chronic exposure to nonionizing radiation: Secondary | ICD-10-CM | POA: Diagnosis not present

## 2020-05-13 DIAGNOSIS — Z1283 Encounter for screening for malignant neoplasm of skin: Secondary | ICD-10-CM | POA: Diagnosis not present

## 2020-05-13 DIAGNOSIS — L719 Rosacea, unspecified: Secondary | ICD-10-CM

## 2020-05-13 DIAGNOSIS — D1800 Hemangioma unspecified site: Secondary | ICD-10-CM

## 2020-05-13 DIAGNOSIS — L814 Other melanin hyperpigmentation: Secondary | ICD-10-CM

## 2020-05-13 DIAGNOSIS — L821 Other seborrheic keratosis: Secondary | ICD-10-CM

## 2020-05-13 DIAGNOSIS — D229 Melanocytic nevi, unspecified: Secondary | ICD-10-CM

## 2020-05-13 NOTE — Patient Instructions (Signed)
Cryotherapy Aftercare  . Wash gently with soap and water everyday.   . Apply Vaseline and Band-Aid daily until healed.  

## 2020-05-13 NOTE — Progress Notes (Signed)
Follow-Up Visit   Subjective  Wanda [name] is a 64 y.o. female who presents for the following: Annual Exam (Yearly mole check ). Hx of BCC on the L arm. Pt c/o irritated itchy spot on the R arm, she would like to have removed today The patient presents for Total-Body Skin Exam (TBSE) for skin cancer screening and mole check.  The following portions of the chart were reviewed this encounter and updated as appropriate:   Tobacco  Allergies  Meds  Problems  Med Hx  Surg Hx  Fam Hx     Review of Systems:  No other skin or systemic complaints except as noted in HPI or Assessment and Plan.  Objective  Well appearing patient in no apparent distress; mood and affect are within normal limits.  A full examination was performed including scalp, head, eyes, ears, nose, lips, neck, chest, axillae, abdomen, back, buttocks, bilateral upper extremities, bilateral lower extremities, hands, feet, fingers, toes, fingernails, and toenails. All findings within normal limits unless otherwise noted below.  Objective  multiple see history: Well healed scar with no evidence of recurrence.   Objective  Right posterior shoulder: Erythematous keratotic or waxy stuck-on papule or plaque.   Objective  Head - Anterior (Face): Mid face erythema with telangiectasias   Objective  RLQA: Red papules.    Assessment & Plan  History of basal cell carcinoma (BCC) multiple see history  Clear. Observe for recurrence. Call clinic for new or changing lesions.  Recommend regular skin exams, daily broad-spectrum spf 30+ sunscreen use, and photoprotection.     Inflamed seborrheic keratosis Right posterior shoulder  Destruction of lesion - Right posterior shoulder Complexity: simple   Destruction method: cryotherapy   Informed consent: discussed and consent obtained   Timeout:  patient name, date of birth, surgical site, and procedure verified Lesion destroyed using liquid nitrogen: Yes   Region frozen  until ice ball extended beyond lesion: Yes   Outcome: patient tolerated procedure well with no complications   Post-procedure details: wound care instructions given    Rosacea Head - Anterior (Face)  Erythematotelangectatic Rosacea   Discussed cosmetic laser treatment a option   Hemangioma of skin RLQA  Benign-appearing.  Observation.  Call clinic for new or changing lesions.  Recommend daily use of broad spectrum spf 30+ sunscreen to sun-exposed areas.    Skin cancer screening   Lentigines - Scattered tan macules - Discussed due to sun exposure - Benign, observe - Call for any changes  Seborrheic Keratoses - Stuck-on, waxy, tan-brown papules and plaques  - Discussed benign etiology and prognosis. - Observe - Call for any changes  Melanocytic Nevi - Tan-brown and/or pink-flesh-colored symmetric macules and papules - Benign appearing on exam today - Observation - Call clinic for new or changing moles - Recommend daily use of broad spectrum spf 30+ sunscreen to sun-exposed areas.   Hemangiomas - Red papules - Discussed benign nature - Observe - Call for any changes  Actinic Damage - Chronic, secondary to cumulative UV/sun exposure - diffuse scaly erythematous macules with underlying dyspigmentation - Recommend daily broad spectrum sunscreen SPF 30+ to sun-exposed areas, reapply every 2 hours as needed.  - Call for new or changing lesions.  Skin cancer screening performed today.  Return in about 1 year (around 05/13/2021) for TBSE .  IMarye Round, CMA, am acting as scribe for Sarina Ser, MD .  Documentation: I have reviewed the above documentation for accuracy and completeness, and I agree with the  above.  Sarina Ser, MD

## 2020-05-19 ENCOUNTER — Encounter: Payer: Self-pay | Admitting: Dermatology

## 2020-05-20 ENCOUNTER — Encounter: Payer: Self-pay | Admitting: Psychiatry

## 2020-05-20 ENCOUNTER — Telehealth (INDEPENDENT_AMBULATORY_CARE_PROVIDER_SITE_OTHER): Payer: Medicare HMO | Admitting: Psychiatry

## 2020-05-20 ENCOUNTER — Other Ambulatory Visit: Payer: Self-pay

## 2020-05-20 DIAGNOSIS — F411 Generalized anxiety disorder: Secondary | ICD-10-CM

## 2020-05-20 DIAGNOSIS — F4010 Social phobia, unspecified: Secondary | ICD-10-CM

## 2020-05-20 DIAGNOSIS — F331 Major depressive disorder, recurrent, moderate: Secondary | ICD-10-CM

## 2020-05-20 MED ORDER — DOXEPIN HCL 10 MG PO CAPS: 10.0000 mg | ORAL_CAPSULE | Freq: Every evening | ORAL | 1 refills | Status: DC | PRN

## 2020-05-20 MED ORDER — ARIPIPRAZOLE 5 MG PO TABS: ORAL_TABLET | ORAL | 0 refills | Status: DC

## 2020-05-20 NOTE — Progress Notes (Signed)
Virtual Visit via Telephone Note  I connected with Wanda [name] on 05/20/20 at  4:00 PM EST by telephone and verified that I am speaking with the correct person using two identifiers.  Location Provider Location : [facility] Patient Location : Home  Participants: Patient , Provider   I discussed the limitations, risks, security and privacy concerns of performing an evaluation and management service by telephone and the availability of in person appointments. I also discussed with the patient that there may be a patient responsible charge related to this service. The patient expressed understanding and agreed to proceed.    I discussed the assessment and treatment plan with the patient. The patient was provided an opportunity to ask questions and all were answered. The patient agreed with the plan and demonstrated an understanding of the instructions.   The patient was advised to call back or seek an in-person evaluation if the symptoms worsen or if the condition fails to improve as anticipated.   [place] MD OP Progress Note  05/20/2020 5:57 PM TEHILLA COFFEL  MRN:  [id number]  Chief Complaint:  Chief Complaint    Follow-up     HPI: Wanda [name] is a 64 year old Caucasian female, widowed, on SSI, lives in [place], has a history of GAD, MDD, social anxiety disorder, SVT, orthostatic hypotension, hyperlipidemia, bereavement was evaluated by telemedicine today.  Patient continues to grieve the loss of her daughter who passed away recently.  Patient today reports she however has been coping okay. She reports she is planning to spend time with her family, her son and also her grand daughter during the holiday season.  She reports sleep as okay on the current medication regimen.  She is compliant on the medications as prescribed. Denies side effects.  Patient reports she does feel like she is sensitive to cold, and this has been getting worse since the past 1 week or so. She agrees to get  in touch with her primary care provider. Her thyroid labs were done more than a year ago.  Patient denies any suicidality, homicidality or perceptual disturbances.  Patient denies any other concerns today.  Visit Diagnosis:    ICD-10-CM   1. GAD (generalized anxiety disorder)  F41.1 ARIPiprazole (ABILIFY) 5 MG tablet  2. MDD (major depressive disorder), recurrent episode, moderate (HCC)  F33.1 doxepin (SINEQUAN) 10 MG capsule  3. Social anxiety disorder  F40.10     Past Psychiatric History: I have reviewed past psychiatric history from my progress note on 08/21/2017. Past trials of Zoloft, Cymbalta, Effexor, Paxil, Prozac, Wellbutrin, Xanax, prazosin, Seroquel, trazodone, doxepin.  Past Medical History:  Past Medical History:  Diagnosis Date  . Anxiety   . Basal cell carcinoma 05/03/2016   Left deltoid. Superficial.  . Basal cell carcinoma 05/03/2016   Left distal lat. tricep. Nodular.  . Borderline diabetes   . BRONCHITIS, ACUTE WITH BRONCHOSPASM 06/25/2010   Qualifier: Diagnosis of  By: Alveta Heimlich MD, Cornelia Copa    . Chronic prescription benzodiazepine use 10/18/2015  . Depression   . Dysrhythmia    tachycardia  . GASTROENTERITIS WITHOUT DEHYDRATION 05/14/2009   Qualifier: Diagnosis of  By: Deborra Medina MD, Tanja Port    . GERD (gastroesophageal reflux disease)   . Herpes   . 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 History of kidney stones   . Hyperlipidemia   . Hypertension   . Irregular heart beat   . Pre-diabetes   . Sinus tachycardia 04/11/2017  . Skin cancer of  face 08/01/2018  . 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 Dossie Arbour Serial number: [id number] Model number: [id number]     Past Surgical History:  Procedure Laterality Date  . ABDOMINAL HYSTERECTOMY    . APPENDECTOMY    . BACK SURGERY     X 3  . BREAST EXCISIONAL BIOPSY Right    surgical bx age 39   . COLONOSCOPY N/A  08/28/2013   Procedure: COLONOSCOPY;  Surgeon: Danie Binder, MD;  [facility];  Service: Endoscopy;  Laterality: N/A;  8:30 AM  . INTRAMEDULLARY (IM) NAIL INTERTROCHANTERIC Left 05/30/2016   Procedure: INTRAMEDULLARY (IM) NAIL INTERTROCHANTRIC;  Surgeon: Thornton Park, MD;  Location: [facility] ORS;  Service: Orthopedics;  Laterality: Left;  . SHOULDER SURGERY Right   . Spinal cord stimulator    . SPINAL CORD STIMULATOR REMOVAL N/A 01/12/2020   Procedure: REMOVAL SPINAL CORD STIMULATOR AND PULSE GENERATOR;  Surgeon: Deetta Perla, MD;  Location: [facility] ORS;  Service: Neurosurgery;  Laterality: N/A;  Local w/ MAC    Family Psychiatric History: I have reviewed family psychiatric history from my progress note on 08/21/2017  Family History:  Family History  Problem Relation Age of Onset  . Diabetes Mother   . COPD Mother   . Heart disease Mother   . Diabetes Father   . COPD Father   . Heart disease Father   . Heart attack Father 65       CABG  . Alcohol abuse Daughter   . Depression Daughter   . Drug abuse Daughter   . Anxiety disorder Daughter   . Tuberculosis Maternal Aunt   . Cancer Paternal Aunt        breast  . Breast cancer Paternal Aunt   . Diabetes Paternal Grandmother   . Diabetes Sister   . Cancer Cousin   . Diabetes Cousin   . Colon cancer Neg Hx   . Sudden Cardiac Death Neg Hx     Social History: Reviewed social history from my progress note on 08/21/2017 Social History   Socioeconomic History  . Marital status: Divorced    Spouse name: Not on file  . Number of children: 2  . Years of education: Not on file  . Highest education level: High school graduate  Occupational History    Comment: disabled  Tobacco Use  . Smoking status: Former Smoker    Packs/day: 0.25    Years: 14.00    Pack years: 3.50    Types: Cigarettes    Quit date: 1992    Years since quitting: 29.9  . Smokeless tobacco: Never Used  Vaping Use  . Vaping Use: Never used  Substance  and Sexual Activity  . Alcohol use: Yes    Comment: occasional; once every 6 months  . Drug use: No  . Sexual activity: Yes    Birth control/protection: Surgical  Other Topics Concern  . Not on file  Social History Narrative  . Not on file   Social Determinants of Health   Financial Resource Strain: Not on file  Food Insecurity: Not on file  Transportation Needs: Not on file  Physical Activity: Not on file  Stress: Not on file  Social Connections: Not on file    Allergies:  Allergies  Allergen Reactions  . Levofloxacin Hives and Nausea And Vomiting  . Tramadol Other (See Comments)    Severe headache  . Codeine Nausea Only    Slight  nausea  . Mobic [Meloxicam] Other (See Comments)    Calf tightness  . Propoxyphene Hcl Rash    Metabolic Disorder Labs: Lab Results  Component Value Date   HGBA1C 5.4 05/29/2016   MPG 108 05/29/2016   No results found for: PROLACTIN Lab Results  Component Value Date   CHOL 198 07/08/2018   TRIG 371 (H) 07/08/2018   HDL 46 07/08/2018   CHOLHDL 4.3 07/08/2018   VLDL 59 (H) 09/29/2016   LDLCALC 78 07/08/2018   LDLCALC 106 (H) 09/29/2016   Lab Results  Component Value Date   TSH 2.560 07/08/2018   TSH 2.050 10/18/2016    Therapeutic Level Labs: No results found for: LITHIUM No results found for: VALPROATE No components found for:  CBMZ  Current Medications: Current Outpatient Medications  Medication Sig Dispense Refill  . ARIPiprazole (ABILIFY) 5 MG tablet TAKE 1 TABLET(5 MG) BY MOUTH DAILY 90 tablet 0  . atorvastatin (LIPITOR) 40 MG tablet Take 40 mg by mouth daily. PM    . cetirizine (ZYRTEC) 10 MG tablet Take 10 mg by mouth daily.     . Cholecalciferol (VITAMIN D3) 5000 units TABS Take 5,000 Units by mouth daily.    Marland Kitchen dexlansoprazole (DEXILANT) 60 MG capsule Take 60 mg by mouth daily.     Marland Kitchen doxepin (SINEQUAN) 10 MG capsule Take 1-2 capsules (10-20 mg total) by mouth at bedtime as needed. FOR SLEEP 30 capsule 1  .  famotidine (PEPCID) 40 MG tablet Take 40 mg by mouth daily.     . hydrOXYzine (VISTARIL) 25 MG capsule TAKE 1 CAPSULE BY MOUTH THREE TIMES DAILY AS NEEDED FOR SEVERE ANXIETY 90 capsule 0  . linaclotide (LINZESS) 72 MCG capsule Take 72 mcg by mouth daily before breakfast. PRN    . LORazepam (ATIVAN) 1 MG tablet Take 0.5 tablets (0.5 mg total) by mouth as directed. Take half tablet ( 0.5 mg) as needed during the day for severe anxiety attacks and half tablet ( 0.5 mg) at bedtime for severe anxiety attacks and sleep. 24 tablet 1  . metoprolol succinate (TOPROL-XL) 25 MG 24 hr tablet TAKE 1 TABLET(25 MG) BY MOUTH DAILY 90 tablet 2  . Multiple Vitamin (MULTIVITAMIN) tablet Take 1 tablet by mouth daily.    . naloxone (NARCAN) 0.4 MG/ML injection     . Naloxone HCl 0.4 MG/0.4ML SOAJ Take if having symptoms of opioid overdose including extreme sleepiness or stopping breathing 0.4 mL 0  . potassium chloride SA (KLOR-CON) 20 MEQ tablet TAKE 2 TABLETS(40 MEQ) BY MOUTH DAILY 180 tablet 3  . risedronate (ACTONEL) 35 MG tablet Take 35 mg by mouth every 7 (seven) days.     . Vilazodone HCl (VIIBRYD) 40 MG TABS DAILY 30 tablet 3   No current facility-administered medications for this visit.     Musculoskeletal: Strength & Muscle Tone: UTA Gait & Station: UTA Patient leans: N/A  Psychiatric Specialty Exam: Review of Systems  Endocrine: Positive for cold intolerance.  Psychiatric/Behavioral: Positive for dysphoric mood.  All other systems reviewed and are negative.   Last menstrual period 09/30/1991.There is no height or weight on file to calculate BMI.  General Appearance: UTA  Eye Contact:  UTA  Speech:  Clear and Coherent  Volume:  Normal  Mood:  Dysphoric  Affect:  UTA  Thought Process:  Goal Directed and Descriptions of Associations: Intact  Orientation:  Full (Time, Place, and Person)  Thought Content: Logical   Suicidal Thoughts:  No  Homicidal Thoughts:  No  Memory:  Immediate;    Fair Recent;   Fair Remote;   Fair  Judgement:  Fair  Insight:  Fair  Psychomotor Activity:  UTA  Concentration:  Concentration: Fair and Attention Span: Fair  Recall:  [employer] of Knowledge: Fair  Language: Fair  Akathisia:  No  Handed:  Right  AIMS (if indicated): UTA  Assets:  Communication Skills Desire for Improvement Social Support  ADL's:  Intact  Cognition: WNL  Sleep:  Fair   Screenings: PHQ2-9   Flowsheet Row Video Visit from 04/16/2020 in [facility] Office Visit from 12/08/2019 in [place] Procedure visit from 07/24/2017 in [place] Office Visit from 07/16/2017 in [place] Procedure visit from 06/28/2017 in [facility]  PHQ-2 Total Score 2 0 0 0 0  PHQ-9 Total Score 10 -- -- -- --       Assessment and Plan: DOHA BOLING is a 64 year old Caucasian female, widowed, lives in [place], has a history of depression, anxiety, chronic pain, SVT was evaluated by telemedicine today. Patient continues to grieve the loss of her daughter however is currently coping well and is compliant on medications. Plan as noted below.  Plan Depression-improving Viibryd 40 mg p.o. daily Abilify 5 mg p.o. daily Doxepin 10 to 20 mg p.o. nightly as needed for sleep Melatonin 10 mg p.o. nightly as needed Continue sleep hygiene techniques  GAD-improving Viibryd as prescribed Lorazepam as needed 0.5 mg as needed during the day for severe anxiety attacks and half tablet at bedtime for anxiety attacks and sleep. Patient has been limiting use.  Insomnia-improving Doxepin 20 mg p.o. nightly as needed Melatonin 10 mg p.o. nightly  Follow-up in clinic in 1 month or sooner if needed.  I have spent atleast 20 minutes non face to face  with patient today. More than 50 % of the time was  spent for preparing to see the patient ( e.g., review of test, records ), obtaining and to review and separately obtained history , ordering medications and test ,psychoeducation and supportive psychotherapy and care coordination,as well as documenting clinical information in electronic health record. This note was generated in part or whole with voice recognition software. Voice recognition is usually quite accurate but there are transcription errors that can and very often do occur. I apologize for any typographical errors that were not detected and corrected.      Ursula Alert, MD 05/20/2020, 5:57 PM

## 2020-05-25 ENCOUNTER — Other Ambulatory Visit: Payer: Self-pay | Admitting: Psychiatry

## 2020-05-25 DIAGNOSIS — F411 Generalized anxiety disorder: Secondary | ICD-10-CM

## 2020-06-01 ENCOUNTER — Telehealth: Payer: Self-pay | Admitting: *Deleted

## 2020-06-01 DIAGNOSIS — F411 Generalized anxiety disorder: Secondary | ICD-10-CM

## 2020-06-01 MED ORDER — LORAZEPAM 1 MG PO TABS: 0.5000 mg | ORAL_TABLET | ORAL | 1 refills | Status: DC

## 2020-06-01 NOTE — Telephone Encounter (Signed)
Patient called and requested refill of Ativan. Please review.

## 2020-06-01 NOTE — Telephone Encounter (Signed)
I have sent Ativan to pharmacy.

## 2020-06-11 ENCOUNTER — Other Ambulatory Visit: Payer: Self-pay | Admitting: Psychiatry

## 2020-06-11 DIAGNOSIS — F411 Generalized anxiety disorder: Secondary | ICD-10-CM

## 2020-06-15 ENCOUNTER — Other Ambulatory Visit: Payer: Self-pay | Admitting: Psychiatry

## 2020-06-15 DIAGNOSIS — F411 Generalized anxiety disorder: Secondary | ICD-10-CM

## 2020-06-23 ENCOUNTER — Encounter: Payer: Self-pay | Admitting: Psychiatry

## 2020-06-23 ENCOUNTER — Other Ambulatory Visit: Payer: Self-pay

## 2020-06-23 ENCOUNTER — Telehealth (INDEPENDENT_AMBULATORY_CARE_PROVIDER_SITE_OTHER): Payer: Medicare HMO | Admitting: Psychiatry

## 2020-06-23 DIAGNOSIS — F331 Major depressive disorder, recurrent, moderate: Secondary | ICD-10-CM | POA: Diagnosis not present

## 2020-06-23 DIAGNOSIS — F411 Generalized anxiety disorder: Secondary | ICD-10-CM | POA: Diagnosis not present

## 2020-06-23 DIAGNOSIS — G4700 Insomnia, unspecified: Secondary | ICD-10-CM

## 2020-06-23 DIAGNOSIS — F4010 Social phobia, unspecified: Secondary | ICD-10-CM | POA: Diagnosis not present

## 2020-06-23 MED ORDER — BELSOMRA 10 MG PO TABS: 10.0000 mg | ORAL_TABLET | Freq: Every day | ORAL | 1 refills | Status: DC

## 2020-06-23 NOTE — Progress Notes (Signed)
Virtual Visit via Telephone Note  I connected with Wanda [name] on 06/23/20 at 10:20 AM EST by telephone and verified that I am speaking with the correct person using two identifiers.  Location Provider Location : [facility] Patient Location : Home  Participants: Patient , Provider   I discussed the limitations, risks, security and privacy concerns of performing an evaluation and management service by telephone and the availability of in person appointments. I also discussed with the patient that there may be a patient responsible charge related to this service. The patient expressed understanding and agreed to proceed.   I discussed the assessment and treatment plan with the patient. The patient was provided an opportunity to ask questions and all were answered. The patient agreed with the plan and demonstrated an understanding of the instructions.   The patient was advised to call back or seek an in-person evaluation if the symptoms worsen or if the condition fails to improve as anticipated.  [place] MD OP Progress Note  06/23/2020 10:51 AM Wanda [name]  MRN:  [id number]  Chief Complaint:  Chief Complaint    Follow-up     HPI: Wanda [name] is a 65 year old Caucasian female, widowed, on SSI, lives in [place], has a history of GAD, MDD, social anxiety disorder, SVT, orthostatic hypotension, hyperlipidemia, bereavement was evaluated by telemedicine today.  Patient today reports she continues to struggle with her grief.  She reports the holidays were difficult for her.  She was able to spend some time with her granddaughter.  Patient reports she missed her therapy appointments.  She reports she was supposed to follow-up with her therapist however did not get a call.  Patient reports doxepin does not help with sleep.  She is interested in medication change.  Discussed Belsomra.  Patient continues to use lorazepam as needed.  That helps.  She has been limiting use.  Patient denies any  suicidality, homicidality or perceptual disturbances.  Patient denies any other concerns today.  Visit Diagnosis:    ICD-10-CM   1. GAD (generalized anxiety disorder)  F41.1   2. MDD (major depressive disorder), recurrent episode, moderate (HCC)  F33.1   3. Social anxiety disorder  F40.10   4. Insomnia, unspecified type  G47.00 Suvorexant (BELSOMRA) 10 MG TABS    Past Psychiatric History: I have reviewed past psychiatric history from my progress note on 08/21/2017.  Past trials of Zoloft, Cymbalta, Effexor, Paxil, Prozac, Wellbutrin, Xanax, prazosin, Seroquel, trazodone, doxepin  Past Medical History:  Past Medical History:  Diagnosis Date  . Anxiety   . Basal cell carcinoma 05/03/2016   Left deltoid. Superficial.  . Basal cell carcinoma 05/03/2016   Left distal lat. tricep. Nodular.  . Borderline diabetes   . BRONCHITIS, ACUTE WITH BRONCHOSPASM 06/25/2010   Qualifier: Diagnosis of  By: Alveta Heimlich MD, Cornelia Copa    . Chronic prescription benzodiazepine use 10/18/2015  . Depression   . Dysrhythmia    tachycardia  . GASTROENTERITIS WITHOUT DEHYDRATION 05/14/2009   Qualifier: Diagnosis of  By: Deborra Medina MD, Tanja Port    . GERD (gastroesophageal reflux disease)   . Herpes   . 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 History of kidney stones   . Hyperlipidemia   . Hypertension   . Irregular heart beat   . Pre-diabetes   . Sinus tachycardia 04/11/2017  . Skin cancer of face 08/01/2018  . 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 Dossie Arbour Serial number: [id number] Model number: [id number]     Past Surgical History:  Procedure Laterality Date  . ABDOMINAL HYSTERECTOMY    . APPENDECTOMY    . BACK SURGERY     X 3  . BREAST EXCISIONAL BIOPSY Right    surgical bx age 63   . COLONOSCOPY N/A 08/28/2013   Procedure: COLONOSCOPY;  Surgeon: Danie Binder, MD;  [facility];  Service: Endoscopy;   Laterality: N/A;  8:30 AM  . INTRAMEDULLARY (IM) NAIL INTERTROCHANTERIC Left 05/30/2016   Procedure: INTRAMEDULLARY (IM) NAIL INTERTROCHANTRIC;  Surgeon: Thornton Park, MD;  Location: [facility] ORS;  Service: Orthopedics;  Laterality: Left;  . SHOULDER SURGERY Right   . Spinal cord stimulator    . SPINAL CORD STIMULATOR REMOVAL N/A 01/12/2020   Procedure: REMOVAL SPINAL CORD STIMULATOR AND PULSE GENERATOR;  Surgeon: Deetta Perla, MD;  Location: [facility] ORS;  Service: Neurosurgery;  Laterality: N/A;  Local w/ MAC    Family Psychiatric History: I have reviewed family psychiatric history from my progress note on 08/21/2017  Family History:  Family History  Problem Relation Age of Onset  . Diabetes Mother   . COPD Mother   . Heart disease Mother   . Diabetes Father   . COPD Father   . Heart disease Father   . Heart attack Father 96       CABG  . Alcohol abuse Daughter   . Depression Daughter   . Drug abuse Daughter   . Anxiety disorder Daughter   . Tuberculosis Maternal Aunt   . Cancer Paternal Aunt        breast  . Breast cancer Paternal Aunt   . Diabetes Paternal Grandmother   . Diabetes Sister   . Cancer Cousin   . Diabetes Cousin   . Colon cancer Neg Hx   . Sudden Cardiac Death Neg Hx     Social History: Reviewed social history from my progress note on 08/21/2017 Social History   Socioeconomic History  . Marital status: Divorced    Spouse name: Not on file  . Number of children: 2  . Years of education: Not on file  . Highest education level: High school graduate  Occupational History    Comment: disabled  Tobacco Use  . Smoking status: Former Smoker    Packs/day: 0.25    Years: 14.00    Pack years: 3.50    Types: Cigarettes    Quit date: 1992    Years since quitting: 30.0  . Smokeless tobacco: Never Used  Vaping Use  . Vaping Use: Never used  Substance and Sexual Activity  . Alcohol use: Yes    Comment: occasional; once every 6 months  . Drug use: No  . Sexual  activity: Yes    Birth control/protection: Surgical  Other Topics Concern  . Not on file  Social History Narrative  . Not on file   Social Determinants of Health   Financial Resource Strain: Not on file  Food Insecurity: Not on file  Transportation Needs: Not on file  Physical Activity: Not on file  Stress: Not on file  Social Connections: Not on file    Allergies:  Allergies  Allergen Reactions  . Levofloxacin Hives and Nausea And Vomiting  . Tramadol Other (See Comments)    Severe headache  . Codeine Nausea Only    Slight nausea  . Mobic [Meloxicam] Other (See Comments)    Calf tightness  . Propoxyphene Hcl Rash  Metabolic Disorder Labs: Lab Results  Component Value Date   HGBA1C 5.4 05/29/2016   MPG 108 05/29/2016   No results found for: PROLACTIN Lab Results  Component Value Date   CHOL 198 07/08/2018   TRIG 371 (H) 07/08/2018   HDL 46 07/08/2018   CHOLHDL 4.3 07/08/2018   VLDL 59 (H) 09/29/2016   LDLCALC 78 07/08/2018   LDLCALC 106 (H) 09/29/2016   Lab Results  Component Value Date   TSH 2.560 07/08/2018   TSH 2.050 10/18/2016    Therapeutic Level Labs: No results found for: LITHIUM No results found for: VALPROATE No components found for:  CBMZ  Current Medications: Current Outpatient Medications  Medication Sig Dispense Refill  . Suvorexant (BELSOMRA) 10 MG TABS Take 10 mg by mouth at bedtime. 30 tablet 1  . ARIPiprazole (ABILIFY) 5 MG tablet TAKE 1 TABLET(5 MG) BY MOUTH DAILY 90 tablet 0  . atorvastatin (LIPITOR) 40 MG tablet Take 40 mg by mouth daily. PM    . cetirizine (ZYRTEC) 10 MG tablet Take 10 mg by mouth daily.     . Cholecalciferol (VITAMIN D3) 5000 units TABS Take 5,000 Units by mouth daily.    Marland Kitchen dexlansoprazole (DEXILANT) 60 MG capsule Take 60 mg by mouth daily.     . famotidine (PEPCID) 40 MG tablet Take 40 mg by mouth daily.     . hydrOXYzine (VISTARIL) 25 MG capsule TAKE 1 CAPSULE BY MOUTH THREE TIMES DAILY AS NEEDED FOR  SEVERE ANXIETY 90 capsule 0  . linaclotide (LINZESS) 72 MCG capsule Take 72 mcg by mouth daily before breakfast. PRN    . LORazepam (ATIVAN) 1 MG tablet Take 0.5 tablets (0.5 mg total) by mouth as directed. Take half tablet ( 0.5 mg) as needed during the day for severe anxiety attacks and half tablet ( 0.5 mg) at bedtime for severe anxiety attacks and sleep. 24 tablet 1  . metoprolol succinate (TOPROL-XL) 25 MG 24 hr tablet TAKE 1 TABLET(25 MG) BY MOUTH DAILY 90 tablet 2  . Multiple Vitamin (MULTIVITAMIN) tablet Take 1 tablet by mouth daily.    . naloxone (NARCAN) 0.4 MG/ML injection     . Naloxone HCl 0.4 MG/0.4ML SOAJ Take if having symptoms of opioid overdose including extreme sleepiness or stopping breathing 0.4 mL 0  . potassium chloride SA (KLOR-CON) 20 MEQ tablet TAKE 2 TABLETS(40 MEQ) BY MOUTH DAILY 180 tablet 3  . risedronate (ACTONEL) 35 MG tablet Take 35 mg by mouth every 7 (seven) days.     . Vilazodone HCl (VIIBRYD) 40 MG TABS DAILY 30 tablet 3   No current facility-administered medications for this visit.     Musculoskeletal: Strength & Muscle Tone: UTA Gait & Station: UTA Patient leans: N/A  Psychiatric Specialty Exam: Review of Systems  Psychiatric/Behavioral: Positive for dysphoric mood and sleep disturbance.  All other systems reviewed and are negative.   Last menstrual period 09/30/1991.There is no height or weight on file to calculate BMI.  General Appearance: UTA  Eye Contact:  UTA  Speech:  Clear and Coherent  Volume:  Normal  Mood:  Dysphoric  Affect:  Tearful  Thought Process:  Goal Directed and Descriptions of Associations: Intact  Orientation:  Full (Time, Place, and Person)  Thought Content: Logical   Suicidal Thoughts:  No  Homicidal Thoughts:  No  Memory:  Immediate;   Fair Recent;   Fair Remote;   Fair  Judgement:  Fair  Insight:  Fair  Psychomotor Activity:  Normal  Concentration:  Concentration: Fair and Attention Span: Fair  Recall:  [employer] of Knowledge: Fair  Language: Fair  Akathisia:  No  Handed:  Right  AIMS (if indicated): UTA  Assets:  Communication Skills Desire for Improvement Housing Social Support  ADL's:  Intact  Cognition: WNL  Sleep:  Poor   Screenings: PHQ2-9   Flowsheet Row Video Visit from 04/16/2020 in [facility] Office Visit from 12/08/2019 in [facility] Procedure visit from 07/24/2017 in [facility] Office Visit from 07/16/2017 in [facility] Procedure visit from 06/28/2017 in [facility]  PHQ-2 Total Score 2 0 0 0 0  PHQ-9 Total Score 10 -- -- -- --       Assessment and Plan: Wanda [name] is a 65 year old Caucasian female, widowed, lives in [place], has a history of depression, anxiety, chronic pain, SVT was evaluated by telemedicine today.  Patient is currently grieving the loss of her daughter and struggles with grief as well as sleep problems.  Plan as noted below.  Plan Depression-some progress Viibryd 40 mg p.o. daily Abilify 5 mg p.o. daily Discontinue doxepin for lack of benefit Patient will continue to benefit from grief counseling since her depressive symptoms likely due to grief reaction.   GAD-improving Viibryd as prescribed Lorazepam as needed 0.5 mg as needed during the day for severe anxiety attacks.  Social anxiety disorder-improving Continue CBT with Christina Hussami  Insomnia-unstable Discontinue doxepin Melatonin 10 mg p.o. nightly Start Belsomra 10 mg p.o. nightly  Provided supportive psychotherapy.  Patient to continue CBT  Follow-up in clinic in 2 weeks or sooner if needed.  I have spent atleast 20 minutes non face to face  with patient today. More than 50 % of the time was spent for preparing to see the patient ( e.g., review of test, records ),  ordering medications and test ,psychoeducation and supportive psychotherapy and care coordination,as well as documenting clinical information in electronic health record. This note was generated in part or whole with voice recognition software. Voice recognition is usually quite accurate but there are transcription errors that can and very often do occur. I apologize for any typographical errors that were not detected and corrected.        Ursula Alert, MD 06/23/2020, 10:51 AM

## 2020-06-23 NOTE — Patient Instructions (Signed)
Suvorexant oral tablets What is this medicine? SUVOREXANT (su-vor-EX-ant) is used to treat insomnia. This medicine helps you to fall asleep and sleep through the night. This medicine may be used for other purposes; ask your health care provider or pharmacist if you have questions. COMMON BRAND NAME(S): Belsomra What should I tell my health care provider before I take this medicine? They need to know if you have any of these conditions:  depression  drink alcohol  drug abuse or addiction  feel sleepy or have fallen asleep suddenly during the day  history of a sudden onset of muscle weakness (cataplexy)  liver disease  lung or breathing disease, like asthma or emphysema  sleep apnea  suicidal thoughts, plans, or attempt; a previous suicide attempt by you or a family member  an unusual or allergic reaction to suvorexant, other medicines, foods, dyes, or preservatives  pregnant or trying to get pregnant  breast-feeding How should I use this medicine? Take this medicine by mouth within 30 minutes of going to bed. Do not take it unless you are able to stay in bed a full night before you must be active again. Follow the directions on the prescription label. You may take this medicine with or without a food. However, this medicine may take longer to work if you take it with or right after meals. Do not take your medicine more often than directed. Do not stop taking this medicine on your own. Always follow your doctor or health care professional's advice. A special MedGuide will be given to you by the pharmacist with each prescription and refill. Be sure to read this information carefully each time. Talk to your pediatrician regarding the use of this medicine in children. Special care may be needed. Overdosage: If you think you have taken too much of this medicine contact a poison control center or emergency room at once. NOTE: This medicine is only for you. Do not share this medicine with  others. What if I miss a dose? This medicine should only be taken immediately before going to sleep. Do not take double or extra doses. What may interact with this medicine?  alcohol  antihistamines for allergy, cough, or cold  aprepitant  boceprevir  certain antibiotics like ciprofloxacin, clarithromycin, erythromycin, telithromycin  certain antivirals for HIV or AIDS  certain medicines for anxiety or sleep  certain medicines for depression like amitriptyline, fluoxetine, nefazodone, sertraline  certain medicines for fungal infections like ketoconazole, posaconazole, fluconazole, itraconazole  certain medicines for seizures like carbamazepine, phenobarbital, primidone, phenytoin  conivaptan  digoxin  diltiazem  general anesthetics like halothane, isoflurane, methoxyflurane, propofol  grapefruit juice  imatinib  medicines that relax muscles for surgery  narcotic medicines for pain  phenothiazines like chlorpromazine, mesoridazine, prochlorperazine, thioridazine  rifampin  verapamil This list may not describe all possible interactions. Give your health care provider a list of all the medicines, herbs, non-prescription drugs, or dietary supplements you use. Also tell them if you smoke, drink alcohol, or use illegal drugs. Some items may interact with your medicine. What should I watch for while using this medicine? Visit your health care professional for regular checks on your progress. Tell your health care professional if your symptoms do not start to get better or if they get worse. Avoid caffeine-containing drinks in the evening hours. After taking this medicine, you may get up out of bed and do an activity that you do not know you are doing. The next morning, you may have no memory of this.  Activities include driving a car ("sleep-driving"), making and eating food, talking on the phone, sexual activity, and sleep-walking. Serious injuries have occurred. Call your  doctor right away if you find out you have done any of these activities. Do not take this medicine if you have used alcohol that evening. Do not take it if you have taken another medicine for sleep. Do not take this medicine unless you are able to stay in bed for a full night (7 to 8 hours) and do not drive or perform other activities requiring full alertness within 8 hours of a dose. Do not drive, use machinery, or do anything that needs mental alertness the day after you take the 20 mg dose of this medicine. The use of lower doses (10 mg) may also cause driving impairment the next day. You may have a decrease in mental alertness the day after use, even if you feel that you are fully awake. Tell your doctor if you will need to perform activities requiring full alertness, such as driving, the next day. Do not stand or sit up quickly after taking this medicine, especially if you are an older patient. This reduces the risk of dizzy or fainting spells. If you or your family notice any changes in your behavior, such as new or worsening depression, thoughts of harming yourself, anxiety, other unusual or disturbing thoughts, or memory loss, call your health care professional right away. After you stop taking this medicine, you may have trouble falling asleep. This is called rebound insomnia. This problem usually goes away on its own after 1 or 2 nights. What side effects may I notice from receiving this medicine? Side effects that you should report to your doctor or health care professional as soon as possible:  allergic reactions like skin rash, itching or hives, swelling of the face, lips, or tongue  hallucinations  periods of leg weakness lasting from seconds to a few minutes  suicidal thoughts, mood changes  unable to move or speak for several minutes while going to sleep or waking up  unusual activities while not fully awake like driving, eating, making phone calls, or sexual activity Side effects  that usually do not require medical attention (report these to your doctor or health care professional if they continue or are bothersome):  daytime drowsiness  headache  nightmares or abnormal dreams  tiredness This list may not describe all possible side effects. Call your doctor for medical advice about side effects. You may report side effects to FDA at 1-800-FDA-1088. Where should I keep my medicine? Keep out of the reach of children. This medicine can be abused. Keep your medicine in a safe place to protect it from theft. Do not share this medicine with anyone. Selling or giving away this medicine is dangerous and against the law. Store at room temperature between 15 and 30 degrees C (59 and 86 degrees F). Throw away any unused medicine after the expiration date. NOTE: This sheet is a summary. It may not cover all possible information. If you have questions about this medicine, talk to your doctor, pharmacist, or health care provider.  2021 Elsevier/Gold Standard (2018-05-31 16:37:12)

## 2020-06-28 ENCOUNTER — Other Ambulatory Visit: Payer: Self-pay

## 2020-06-28 ENCOUNTER — Ambulatory Visit (INDEPENDENT_AMBULATORY_CARE_PROVIDER_SITE_OTHER): Payer: Medicare HMO | Admitting: Licensed Clinical Social Worker

## 2020-06-28 DIAGNOSIS — F411 Generalized anxiety disorder: Secondary | ICD-10-CM | POA: Diagnosis not present

## 2020-06-28 DIAGNOSIS — F331 Major depressive disorder, recurrent, moderate: Secondary | ICD-10-CM | POA: Diagnosis not present

## 2020-06-28 DIAGNOSIS — Z634 Disappearance and death of family member: Secondary | ICD-10-CM | POA: Diagnosis not present

## 2020-06-28 NOTE — Progress Notes (Addendum)
Virtual Visit via Audio Note  I connected with ADWOA AXE on 06/28/20 at 10:00 AM EST by an audio enabled telemedicine application and verified that I am speaking with the correct person using two identifiers.  Location: Patient: home Provider: [facility]   I discussed the limitations of evaluation and management by telemedicine and the availability of in person appointments. The patient expressed understanding and agreed to proceed.  The patient was advised to call back or seek an in-person evaluation if the symptoms worsen or if the condition fails to improve as anticipated.  I provided 45 minutes of non-face-to-face time during this encounter.   Davinia Riccardi R Lenord Fralix, LCSW    THERAPIST PROGRESS NOTE  Session Time: 10-10:45a  Participation Level: Active  Behavioral Response: Neat and Well GroomedAlertAnxious and Depressed  Type of Therapy: Individual Therapy  Treatment Goals addressed: Learn one coping skills to help manage/reduce binge eating (impulse control) behaviors; explore and express issues associated with grief with therapist without becoming overwhelmed with negative emotions.  Interventions: Supportive and Other: grief counseling  Summary: CRISS BARTLES is a 65 y.o. female who presents with continuing symptoms related to depression, anxiety, and bereavement. Pt reports that she is compliant with medication and is getting poor quality and quantity of sleep. Pt reporting low energy, low motivation and low initiative.  Allowed pt to explore and express thoughts and feelings related to bereavement. Gave pt safe space to explore feelings. Pt did get emotional--pt is managing emotions better while expressing thoughts about loss. Pt explored relationship with granddaughter and son in law.  Discussed pts recent binge-eating (sweets) and reviewed several CBT-based interventions that could help manage binge eating. Reviewed with pt and had pt reflect understanding so that she  can choose which one best fits her current lifestyle.   Continued to encourage pts focus on self care, positive social engagement.   Suicidal/Homicidal: No  Therapist Response: Shemica is using coping mechanisms to help manage binge-eating behaviors. Alaisa learned new coping mechanisms in session today; will assess at future sessions. Jolanta is also able to express and explore issues related to grief without becoming emotionally overwhelmed.  In previous sessions, Marlinda has gotten overwhelmed during grief exploration, so this is indicative of progress. Treatment to continue as indicated.   Plan: Return again in 4 weeks.  Diagnosis: Axis I: Major depressive disorder, recurrent, moderate; Generalized anxiety disorder, bereavement    Axis II: No diagnosis    Celinda Dethlefs Kati Riggenbach, LCSW 06/28/2020

## 2020-06-29 ENCOUNTER — Encounter: Payer: Self-pay | Admitting: Psychiatry

## 2020-06-29 ENCOUNTER — Telehealth: Payer: Self-pay | Admitting: Psychiatry

## 2020-06-29 ENCOUNTER — Telehealth (INDEPENDENT_AMBULATORY_CARE_PROVIDER_SITE_OTHER): Payer: Medicare Other | Admitting: Psychiatry

## 2020-06-29 DIAGNOSIS — F411 Generalized anxiety disorder: Secondary | ICD-10-CM | POA: Diagnosis not present

## 2020-06-29 DIAGNOSIS — F4010 Social phobia, unspecified: Secondary | ICD-10-CM

## 2020-06-29 DIAGNOSIS — G4700 Insomnia, unspecified: Secondary | ICD-10-CM

## 2020-06-29 DIAGNOSIS — F331 Major depressive disorder, recurrent, moderate: Secondary | ICD-10-CM

## 2020-06-29 MED ORDER — ZOLPIDEM TARTRATE 5 MG PO TABS: 5.0000 mg | ORAL_TABLET | Freq: Every evening | ORAL | 1 refills | Status: DC | PRN

## 2020-06-29 MED ORDER — ZALEPLON 5 MG PO CAPS: 5.0000 mg | ORAL_CAPSULE | Freq: Every evening | ORAL | 1 refills | Status: DC | PRN

## 2020-06-29 NOTE — Patient Instructions (Signed)
Zaleplon capsules What is this medicine? ZALEPLON (ZAL e plon) is used to treat insomnia. This medicine helps you to fall asleep. This medicine may be used for other purposes; ask your health care provider or pharmacist if you have questions. COMMON BRAND NAME(S): Sonata What should I tell my health care provider before I take this medicine? They need to know if you have any of these conditions:  depression  history of a drug or alcohol abuse problem  liver disease  lung or breathing disease  sleep-walking, driving, eating or other activity while not fully awake after taking a sleep medicine  suicidal thoughts  an unusual or allergic reaction to zaleplon, other medicines, foods, dyes, or preservatives  pregnant or trying to get pregnant  breast-feeding How should I use this medicine? Take this medicine by mouth with water. Take it as directed on the label. Do not use it more often than directed. There may be unused or extra doses in the bottle after you finish your treatment. Talk to your health care provider if you have questions about your dose. A special MedGuide will be given to you by the pharmacist with each prescription and refill. Be sure to read this information carefully each time. Talk to your health care provider about the use of this medicine in children. Special care may be needed. Overdosage: If you think you have taken too much of this medicine contact a poison control center or emergency room at once. NOTE: This medicine is only for you. Do not share this medicine with others. What if I miss a dose? This does not apply. This medicine should only be taken immediately before going to sleep. Do not take double or extra doses. What may interact with this medicine?  barbiturate medicines for inducing sleep or treating seizures  carbamazepine  certain medicines for allergies, like azatadine, clemastine, diphenhydramine  certain medicines for depression, anxiety, or  other emotional or psychiatric problems  certain medicines for pain  cimetidine  erythromycin  medicines for fungal infections like ketoconazole, fluconazole, or itraconazole  other medicines given for sleep  phenytoin  rifampin This list may not describe all possible interactions. Give your health care provider a list of all the medicines, herbs, non-prescription drugs, or dietary supplements you use. Also tell them if you smoke, drink alcohol, or use illegal drugs. Some items may interact with your medicine. What should I watch for while using this medicine? Visit your doctor or health care professional for regular checks on your progress. Keep a regular sleep schedule by going to bed at about the same time each night. Avoid caffeine-containing drinks in the evening hours. When sleep medicines are used every night for more than a few weeks, they may stop working. Talk to your doctor if you still have trouble sleeping. After taking this medicine, you may get up out of bed and do an activity that you do not know you are doing. The next morning, you may have no memory of this. Activities include driving a car ("sleep-driving"), making and eating food, talking on the phone, sexual activity, and sleep-walking. Serious injuries have occurred. Stop the medicine and call your doctor right away if you find out you have done any of these activities. Do not take this medicine if you have used alcohol that evening. Do not take it if you have taken another medicine for sleep. The risk of doing these sleep-related activities is higher. Do not take this medicine unless you are able to stay in   bed for a full night (7 to 8 hours) before you must be active again. You may have a decrease in mental alertness the day after use, even if you feel that you are fully awake. Tell your doctor if you will need to perform activities requiring full alertness, such as driving, the next day. Do not stand or sit up quickly  after taking this medicine, especially if you are an older patient. This reduces the risk of dizzy or fainting spells. If you or your family notice any changes in your behavior, such as new or worsening depression, thoughts of harming yourself, anxiety, other unusual or disturbing thoughts, or memory loss, call your doctor right away. After you stop taking this medicine, you may have trouble falling asleep. This is called rebound insomnia. This problem usually goes away on its own after 1 or 2 nights. What side effects may I notice from receiving this medicine? Side effects that you should report to your doctor or health care professional as soon as possible:  allergic reactions like skin rash, itching or hives, swelling of the face, lips, or tongue  breathing problems  changes in vision  confusion  depression, suicidal thoughts  feeling faint or lightheaded  hallucinations  hostility, restlessness, excitability  slurred speech  staggering, tremors  unusual activities while not fully awake like driving, eating, making phone calls  unusually weak or tired Side effects that usually do not require medical attention (report to your doctor or health care professional if they continue or are bothersome):  diarrhea  difficulty with coordination  loss of memory  nightmares  stomach upset This list may not describe all possible side effects. Call your doctor for medical advice about side effects. You may report side effects to FDA at 1-800-FDA-1088. Where should I keep my medicine? Keep out of the reach of children. This medicine can be abused. Keep your medicine in a safe place to protect it from theft. Do not share this medicine with anyone. Selling or giving away this medicine is dangerous and against the law. This medicine may cause accidental overdose and death if taken by other adults, children, or pets. Mix any unused medicine with a substance like cat litter or coffee  grounds. Then throw the medicine away in a sealed container like a sealed bag or a coffee can with a lid. Do not use the medicine after the expiration date. Store at room temperature between 20 and 25 degrees C (68 and 77 degrees F). Protect from light. NOTE: This sheet is a summary. It may not cover all possible information. If you have questions about this medicine, talk to your doctor, pharmacist, or health care provider.  2021 [employer]vier/Gold Standard (2018-05-21 09:26:01)  

## 2020-06-29 NOTE — Progress Notes (Signed)
Virtual Visit via Video Note  I connected with [name] on 06/29/20 at 11:30 AM EST by a video enabled telemedicine application and verified that I am speaking with the correct person using two identifiers.  Location Provider Location : [facility] Patient Location : Home  Participants: Patient , Provider   I discussed the limitations of evaluation and management by telemedicine and the availability of in person appointments. The patient expressed understanding and agreed to proceed.    I discussed the assessment and treatment plan with the patient. The patient was provided an opportunity to ask questions and all were answered. The patient agreed with the plan and demonstrated an understanding of the instructions.   The patient was advised to call back or seek an in-person evaluation if the symptoms worsen or if the condition fails to improve as anticipated.  [place] MD OP Progress Note  06/29/2020 12:55 PM [name]  MRN:  [id number]  Chief Complaint:  Chief Complaint    Follow-up     HPI: Wanda [name] is a 65 year old Caucasian female, widowed, on SSI, lives in [place], has a history of GAD, MDD, social anxiety disorder, SVT, orthostatic hypotension, hyperlipidemia, bereavement was evaluated by telemedicine today.  Patient today reports she developed side effects to the Belsomra.  She reports that she took it a couple of times and she woke up in the morning and noticed her bed sheet as ruffled and her mattress was all over the place.  She does not know what she was doing at night.  Has no memory of it.  Patient continues to struggle with sleep.  She reports she is coping with her grief better.  She continues to follow-up with her therapist.  Therapy sessions are beneficial.  Patient denies any suicidality, homicidality or perceptual disturbances.  Patient denies any other concerns today.  Visit Diagnosis:    ICD-10-CM   1. GAD (generalized anxiety disorder)  F41.1   2.  MDD (major depressive disorder), recurrent episode, moderate (HCC)  F33.1   3. Social anxiety disorder  F40.10   4. Insomnia, unspecified type  G47.00 zaleplon (SONATA) 5 MG capsule    Past Psychiatric History: I have reviewed past psychiatric history from my progress note on 08/21/2017.  Past trials of Zoloft, Cymbalta, Effexor, Paxil, Prozac, Wellbutrin, Xanax, prazosin, Seroquel, trazodone, doxepin  Past Medical History:  Past Medical History:  Diagnosis Date  . Anxiety   . Basal cell carcinoma 05/03/2016   Left deltoid. Superficial.  . Basal cell carcinoma 05/03/2016   Left distal lat. tricep. Nodular.  . Borderline diabetes   . BRONCHITIS, ACUTE WITH BRONCHOSPASM 06/25/2010   Qualifier: Diagnosis of  By: Alveta Heimlich MD, Cornelia Copa    . Chronic prescription benzodiazepine use 10/18/2015  . Depression   . Dysrhythmia    tachycardia  . GASTROENTERITIS WITHOUT DEHYDRATION 05/14/2009   Qualifier: Diagnosis of  By: Deborra Medina MD, Tanja Port    . GERD (gastroesophageal reflux disease)   . Herpes   . 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 History of kidney stones   . Hyperlipidemia   . Hypertension   . Irregular heart beat   . Pre-diabetes   . Sinus tachycardia 04/11/2017  . Skin cancer of face 08/01/2018  . 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 Dossie Arbour Serial number: [id number] Model number: [id number]     Past Surgical History:  Procedure Laterality Date  . ABDOMINAL  HYSTERECTOMY    . APPENDECTOMY    . BACK SURGERY     X 3  . BREAST EXCISIONAL BIOPSY Right    surgical bx age 40   . COLONOSCOPY N/A 08/28/2013   Procedure: COLONOSCOPY;  Surgeon: Danie Binder, MD;  Location: [facility];  Service: Endoscopy;  Laterality: N/A;  8:30 AM  . INTRAMEDULLARY (IM) NAIL INTERTROCHANTERIC Left 05/30/2016   Procedure: INTRAMEDULLARY (IM) NAIL INTERTROCHANTRIC;  Surgeon: Thornton Park, MD;  Location:  [facility] ORS;  Service: Orthopedics;  Laterality: Left;  . SHOULDER SURGERY Right   . Spinal cord stimulator    . SPINAL CORD STIMULATOR REMOVAL N/A 01/12/2020   Procedure: REMOVAL SPINAL CORD STIMULATOR AND PULSE GENERATOR;  Surgeon: Deetta Perla, MD;  Location: [facility] ORS;  Service: Neurosurgery;  Laterality: N/A;  Local w/ MAC    Family Psychiatric History: I have reviewed family psychiatric history from my progress note on 08/21/2017  Family History:  Family History  Problem Relation Age of Onset  . Diabetes Mother   . COPD Mother   . Heart disease Mother   . Diabetes Father   . COPD Father   . Heart disease Father   . Heart attack Father 70       CABG  . Alcohol abuse Daughter   . Depression Daughter   . Drug abuse Daughter   . Anxiety disorder Daughter   . Tuberculosis Maternal Aunt   . Cancer Paternal Aunt        breast  . Breast cancer Paternal Aunt   . Diabetes Paternal Grandmother   . Diabetes Sister   . Cancer Cousin   . Diabetes Cousin   . Colon cancer Neg Hx   . Sudden Cardiac Death Neg Hx     Social History: Reviewed social history from my progress note on 08/21/2017 Social History   Socioeconomic History  . Marital status: Divorced    Spouse name: Not on file  . Number of children: 2  . Years of education: Not on file  . Highest education level: High school graduate  Occupational History    Comment: disabled  Tobacco Use  . Smoking status: Former Smoker    Packs/day: 0.25    Years: 14.00    Pack years: 3.50    Types: Cigarettes    Quit date: 1992    Years since quitting: 30.1  . Smokeless tobacco: Never Used  Vaping Use  . Vaping Use: Never used  Substance and Sexual Activity  . Alcohol use: Yes    Comment: occasional; once every 6 months  . Drug use: No  . Sexual activity: Yes    Birth control/protection: Surgical  Other Topics Concern  . Not on file  Social History Narrative  . Not on file   Social Determinants of Health   Financial  Resource Strain: Not on file  Food Insecurity: Not on file  Transportation Needs: Not on file  Physical Activity: Not on file  Stress: Not on file  Social Connections: Not on file    Allergies:  Allergies  Allergen Reactions  . Levofloxacin Hives and Nausea And Vomiting  . Tramadol Other (See Comments)    Severe headache  . Codeine Nausea Only    Slight nausea  . Mobic [Meloxicam] Other (See Comments)    Calf tightness  . Propoxyphene Hcl Rash    Metabolic Disorder Labs: Lab Results  Component Value Date   HGBA1C 5.4 05/29/2016   MPG 108 05/29/2016   No  results found for: PROLACTIN Lab Results  Component Value Date   CHOL 198 07/08/2018   TRIG 371 (H) 07/08/2018   HDL 46 07/08/2018   CHOLHDL 4.3 07/08/2018   VLDL 59 (H) 09/29/2016   LDLCALC 78 07/08/2018   LDLCALC 106 (H) 09/29/2016   Lab Results  Component Value Date   TSH 2.560 07/08/2018   TSH 2.050 10/18/2016    Therapeutic Level Labs: No results found for: LITHIUM No results found for: VALPROATE No components found for:  CBMZ  Current Medications: Current Outpatient Medications  Medication Sig Dispense Refill  . zaleplon (SONATA) 5 MG capsule Take 1 capsule (5 mg total) by mouth at bedtime as needed for sleep. 15 capsule 1  . ARIPiprazole (ABILIFY) 5 MG tablet TAKE 1 TABLET(5 MG) BY MOUTH DAILY 90 tablet 0  . atorvastatin (LIPITOR) 40 MG tablet Take 40 mg by mouth daily. PM    . cetirizine (ZYRTEC) 10 MG tablet Take 10 mg by mouth daily.     . Cholecalciferol (VITAMIN D3) 5000 units TABS Take 5,000 Units by mouth daily.    Marland Kitchen dexlansoprazole (DEXILANT) 60 MG capsule Take 60 mg by mouth daily.     . famotidine (PEPCID) 40 MG tablet Take 40 mg by mouth daily.     . hydrOXYzine (VISTARIL) 25 MG capsule TAKE 1 CAPSULE BY MOUTH THREE TIMES DAILY AS NEEDED FOR SEVERE ANXIETY 90 capsule 0  . linaclotide (LINZESS) 72 MCG capsule Take 72 mcg by mouth daily before breakfast. PRN    . LORazepam (ATIVAN) 1 MG  tablet Take 0.5 tablets (0.5 mg total) by mouth as directed. Take half tablet ( 0.5 mg) as needed during the day for severe anxiety attacks and half tablet ( 0.5 mg) at bedtime for severe anxiety attacks and sleep. 24 tablet 1  . metoprolol succinate (TOPROL-XL) 25 MG 24 hr tablet TAKE 1 TABLET(25 MG) BY MOUTH DAILY 90 tablet 2  . Multiple Vitamin (MULTIVITAMIN) tablet Take 1 tablet by mouth daily.    . naloxone (NARCAN) 0.4 MG/ML injection     . Naloxone HCl 0.4 MG/0.4ML SOAJ Take if having symptoms of opioid overdose including extreme sleepiness or stopping breathing 0.4 mL 0  . potassium chloride SA (KLOR-CON) 20 MEQ tablet TAKE 2 TABLETS(40 MEQ) BY MOUTH DAILY 180 tablet 3  . risedronate (ACTONEL) 35 MG tablet Take 35 mg by mouth every 7 (seven) days.     . Vilazodone HCl (VIIBRYD) 40 MG TABS DAILY 30 tablet 3   No current facility-administered medications for this visit.     Musculoskeletal: Strength & Muscle Tone: UTA Gait & Station: UTA Patient leans: N/A  Psychiatric Specialty Exam: Review of Systems  Psychiatric/Behavioral: Positive for dysphoric mood and sleep disturbance.  All other systems reviewed and are negative.   Last menstrual period 09/30/1991.There is no height or weight on file to calculate BMI.  General Appearance: Casual  Eye Contact:  Fair  Speech:  Clear and Coherent  Volume:  Normal  Mood:  Dysphoric improving  Affect:  Congruent  Thought Process:  Goal Directed and Descriptions of Associations: Intact  Orientation:  Full (Time, Place, and Person)  Thought Content: Logical   Suicidal Thoughts:  No  Homicidal Thoughts:  No  Memory:  Immediate;   Fair Recent;   Fair Remote;   Fair  Judgement:  Fair  Insight:  Fair  Psychomotor Activity:  Normal  Concentration:  Concentration: Fair and Attention Span: Fair  Recall:  [employer] of  Knowledge: Fair  Language: Fair  Akathisia:  No  Handed:  Right  AIMS (if indicated): UTA  Assets:  Communication  Skills Desire for Improvement Housing Social Support  ADL's:  Intact  Cognition: WNL  Sleep:  Poor   Screenings: PHQ2-9   Flowsheet Row Video Visit from 04/16/2020 in [facility] Office Visit from 12/08/2019 in [facility] Procedure visit from 07/24/2017 in [facility] Office Visit from 07/16/2017 in [facility] Procedure visit from 06/28/2017 in [facility]  PHQ-2 Total Score 2 0 0 0 0  PHQ-9 Total Score 10 -- -- -- --       Assessment and Plan: KAMEELA LEIPOLD is a 65 year old Caucasian female, widowed, lives in [place], has a history of depression, anxiety, chronic pain, SVT was evaluated by telemedicine today.  Patient is currently struggling with sleep problems.  Plan as noted below.  Plan MDD-improving Viibryd 40 mg p.o. daily Abilify 5 mg p.o. daily Patient to continue CBT with her therapist.  GAD-improving Viibryd as prescribed Lorazepam as needed, 0.5 mg as needed during the day for severe anxiety attacks only.  Social anxiety disorder-improving Continue CBT with her therapist.  Insomnia-unstable Discontinue Belsomra. Start Sonata 5 mg p.o. nightly. Provided medication education and discussed sleep complex behaviors.  Follow-up in clinic again 2 weeks or sooner if needed.  I have spent atleast 20 minutes face to face by video with patient today. More than 50 % of the time was spent for preparing to see the patient ( e.g., review of test, records ), ordering medications and test ,psychoeducation and supportive psychotherapy and care coordination,as well as documenting clinical information in electronic health record. This note was generated in part or whole with voice recognition software. Voice recognition is usually quite accurate but there are transcription errors that  can and very often do occur. I apologize for any typographical errors that were not detected and corrected.        Ursula Alert, MD 06/29/2020, 12:55 PM

## 2020-06-29 NOTE — Telephone Encounter (Signed)
Since [employer] is not on formulary.  We will start Ambien 5 mg at bedtime as needed.  We will send new prescription to pharmacy.

## 2020-07-13 ENCOUNTER — Other Ambulatory Visit: Payer: Self-pay | Admitting: Psychiatry

## 2020-07-13 ENCOUNTER — Other Ambulatory Visit: Payer: Self-pay | Admitting: Internal Medicine

## 2020-07-13 DIAGNOSIS — F3342 Major depressive disorder, recurrent, in full remission: Secondary | ICD-10-CM

## 2020-07-13 DIAGNOSIS — F411 Generalized anxiety disorder: Secondary | ICD-10-CM

## 2020-07-14 ENCOUNTER — Encounter: Payer: Self-pay | Admitting: Internal Medicine

## 2020-07-14 ENCOUNTER — Telehealth: Payer: Medicare HMO | Admitting: Psychiatry

## 2020-07-14 ENCOUNTER — Ambulatory Visit: Payer: Medicare Other | Admitting: Internal Medicine

## 2020-07-14 ENCOUNTER — Other Ambulatory Visit: Payer: Self-pay

## 2020-07-14 VITALS — BP 110/80 | HR 144 | Ht 61.0 in | Wt 145.1 lb

## 2020-07-14 DIAGNOSIS — I951 Orthostatic hypotension: Secondary | ICD-10-CM

## 2020-07-14 DIAGNOSIS — R000 Tachycardia, unspecified: Secondary | ICD-10-CM

## 2020-07-14 NOTE — Patient Instructions (Signed)
Medication Instructions:  Your physician recommends that you continue on your current medications as directed. Please refer to the Current Medication list given to you today.  *If you need a refill on your cardiac medications before your next appointment, please call your pharmacy*   Lab Work: Your physician recommends that you return for lab work in: TODAY - CBC, CMP, CRP, TSH, SED RATE, Mg.   If you have labs (blood work) drawn today and your tests are completely normal, you will receive your results only by: Marland Kitchen MyChart Message (if you have MyChart) OR . A paper copy in the mail If you have any lab test that is abnormal or we need to change your treatment, we will call you to review the results.  Testing/Procedures: none  Follow-Up: At [facility], you and your health needs are our priority.  As part of our continuing mission to provide you with exceptional heart care, we have created designated Provider Care Teams.  These Care Teams include your primary Cardiologist (physician) and Advanced Practice Providers (APPs -  Physician Assistants and Nurse Practitioners) who all work together to provide you with the care you need, when you need it.  We recommend signing up for the patient portal called "MyChart".  Sign up information is provided on this After Visit Summary.  MyChart is used to connect with patients for Virtual Visits (Telemedicine).  Patients are able to view lab/test results, encounter notes, upcoming appointments, etc.  Non-urgent messages can be sent to your provider as well.   To learn more about what you can do with MyChart, go to [email or web address].    Your next appointment:   2 week(s)  The format for your next appointment:   In Person  Provider:   You may see DR Harrell Gave END or one of the following Advanced Practice Providers on your designated Care Team:    Murray Hodgkins, NP  Christell Faith, PA-C  Marrianne Mood, PA-C  Cadence Kathlen Mody,  [place]  Laurann Montana, NP    Other Instructions Please stay hydrated with plenty of water. Avoid caffeine and alcohol.

## 2020-07-14 NOTE — Progress Notes (Signed)
Follow-up Outpatient Visit Date: 07/14/2020  Primary Care Provider: Barbaraann Boys, MD [address] [place] [address]  Chief Complaint: Follow-up lightheadedness and tachycardia  HPI:  Wanda [name] is a 65 y.o. female with history of orthostatic hypotension, hyperlipidemia, PSVT, and anxiety/depression, who presents for follow-up of palpitations and lightheadedness.  I last saw her a year ago, which time she was feeling well with only brief episodes of orthostatic lightheadedness.  Today, Wanda [name] reports that she has been feeling fairly well.  She experienced some exertional dyspnea walking across the parking lot though she otherwise was not noticed any significant shortness of breath.  She also denies chest pain, palpitations, lightheadedness, edema, and syncope.  She is unaware of elevated heart rates.  She is trying to stay well-hydrating, drinking Gatorade or water throughout the day.  She remains compliant with her medications, including metoprolol.  --------------------------------------------------------------------------------------------------  Cardiovascular History & Procedures: Cardiovascular Problems:  Orthostatic lightheadedness/fall  Palpitations with PSVT on event monitor  Risk Factors:  Hyperlipidemia  Cath/PCI:  None  CV Surgery:  None  EP Procedures and Devices:  Cardiac event monitor (06/28/16): Predominantly sinus rhythm with rare PACs and PVCs, as well as paroxysmal SVT (lasting up to 10.8 seconds).  Non-Invasive Evaluation(s):  Pharmacologic MPI (09/29/16): Low risk study without ischemia or scar. Nonspecific ST depression noted withregadenoson. LVEF 55-65%.   Bilateral carotid Doppler (05/31/16): Mild bilateral carotid atherosclerosis (<50%). Antegrade vertebral artery flow bilaterally.  TTE (05/29/16): Technically difficult study. Normal LV size and function (LVEF 60-65%). No significant valvular abnormalities (though suboptimally  visualized). Normal RV size and function.   Recent CV Pertinent Labs: Lab Results  Component Value Date   CHOL 198 07/08/2018   HDL 46 07/08/2018   LDLCALC 78 07/08/2018   LDLDIRECT 197.3 02/01/2010   TRIG 371 (H) 07/08/2018   CHOLHDL 4.3 07/08/2018   CHOLHDL 5.0 09/29/2016   INR 0.9 01/01/2020   K 4.1 01/01/2020   K 3.8 12/04/2012   MG 2.2 02/06/2017   BUN 9 01/01/2020   BUN 9 07/09/2019   BUN 16 12/04/2012   CREATININE 0.80 01/01/2020   CREATININE 0.64 12/04/2012    Past medical and surgical history were reviewed and updated in EPIC.  Current Meds  Medication Sig  . ARIPiprazole (ABILIFY) 5 MG tablet TAKE 1 TABLET(5 MG) BY MOUTH DAILY  . atorvastatin (LIPITOR) 40 MG tablet Take 40 mg by mouth daily. PM  . cetirizine (ZYRTEC) 10 MG tablet Take 10 mg by mouth daily.   . Cholecalciferol (VITAMIN D3) 5000 units TABS Take 5,000 Units by mouth daily.  Marland Kitchen dexlansoprazole (DEXILANT) 60 MG capsule Take 60 mg by mouth daily.   . famotidine (PEPCID) 40 MG tablet Take 40 mg by mouth daily.   . hydrOXYzine (VISTARIL) 25 MG capsule TAKE 1 CAPSULE BY MOUTH THREE TIMES DAILY AS NEEDED FOR SEVERE ANXIETY  . LORazepam (ATIVAN) 1 MG tablet Take 0.5 tablets (0.5 mg total) by mouth as directed. Take half tablet ( 0.5 mg) as needed during the day for severe anxiety attacks and half tablet ( 0.5 mg) at bedtime for severe anxiety attacks and sleep.  . metoprolol succinate (TOPROL-XL) 25 MG 24 hr tablet TAKE 1 TABLET(25 MG) BY MOUTH DAILY  . Multiple Vitamin (MULTIVITAMIN) tablet Take 1 tablet by mouth daily.  . potassium chloride (KLOR-CON) 20 MEQ packet Take 20 mEq by mouth 2 (two) times daily.  . risedronate (ACTONEL) 35 MG tablet Take 35 mg by mouth every 7 (seven) days.   Marland Kitchen  Vilazodone HCl (VIIBRYD) 40 MG TABS DAILY  . zolpidem (AMBIEN) 5 MG tablet Take 1 tablet (5 mg total) by mouth at bedtime as needed for sleep.    Allergies: Levofloxacin, Tramadol, Codeine, Mobic [meloxicam], and  Propoxyphene hcl  Social History   Tobacco Use  . Smoking status: Former Smoker    Packs/day: 0.25    Years: 14.00    Pack years: 3.50    Types: Cigarettes    Quit date: 1992    Years since quitting: 30.1  . Smokeless tobacco: Never Used  Vaping Use  . Vaping Use: Never used  Substance Use Topics  . Alcohol use: Yes    Comment: occasional; once every 6 months  . Drug use: No    Family History  Problem Relation Age of Onset  . Diabetes Mother   . COPD Mother   . Heart disease Mother   . Diabetes Father   . COPD Father   . Heart disease Father   . Heart attack Father 68       CABG  . Alcohol abuse Daughter   . Depression Daughter   . Drug abuse Daughter   . Anxiety disorder Daughter   . Tuberculosis Maternal Aunt   . Cancer Paternal Aunt        breast  . Breast cancer Paternal Aunt   . Diabetes Paternal Grandmother   . Diabetes Sister   . Cancer Cousin   . Diabetes Cousin   . Colon cancer Neg Hx   . Sudden Cardiac Death Neg Hx     Review of Systems: A 12-system review of systems was performed and was negative except as noted in the HPI.  --------------------------------------------------------------------------------------------------  Physical Exam: BP 110/80 (BP Location: Left Arm, Patient Position: Sitting, Cuff Size: Normal)   Pulse (!) 144   Ht $R'5\' 1"'jA$  (1.549 m)   Wt 145 lb 2 oz (65.8 kg)   LMP 09/30/1991 (Approximate) Comment: 1993  SpO2 97%   BMI 27.42 kg/m  HR decreased to 130 bpm at rest.  General:  NAD. Neck: No JVD or HJR. Lungs: Clear to auscultation bilaterally without wheezes or crackles. Heart: Tachycardic but regular without murmurs Abdomen: Soft, nontender, nondistended. Extremities: No lower extremity edema.  EKG:  Sinus tachycardia with isolated PVC and nonspecific ST/T changes.  HR has increased significantly since 07/09/2019.  Limited bedside echo (peformed with Meyer Cory, RN, as chaperone): Normal LV and RV size and  function.  No significant MR/TR.  No significant pericardial effusion.  Normal CVP.  Lab Results  Component Value Date   WBC 5.6 01/01/2020   HGB 13.7 01/01/2020   HCT 39.7 01/01/2020   MCV 93.2 01/01/2020   PLT 229 01/01/2020    Lab Results  Component Value Date   NA 141 01/01/2020   K 4.1 01/01/2020   CL 105 01/01/2020   CO2 28 01/01/2020   BUN 9 01/01/2020   CREATININE 0.80 01/01/2020   GLUCOSE 112 (H) 01/01/2020   ALT 24 07/08/2018    Lab Results  Component Value Date   CHOL 198 07/08/2018   HDL 46 07/08/2018   LDLCALC 78 07/08/2018   LDLDIRECT 197.3 02/01/2010   TRIG 371 (H) 07/08/2018   CHOLHDL 4.3 07/08/2018    --------------------------------------------------------------------------------------------------  ASSESSMENT AND PLAN: Sinus tachycardia and orthostatic hypotension: Marked sinus tachycardia noted today albeit without symptoms.  Orthostatic hypotension also noted.  I personally performed a bedside echocardiogram that showed normal biventricular function without pericardial effusion.  No significant MR/TR  was observed.  IVC was normal in size with respiratory variation.  Though Wanda [name] has demonstrated tachycardia and orthostatic hypotension, the tachycardia has never been this profound.  Given lack of symptoms, I have recommended that we check a CBC, CMP, Mg, TSH, ESR, and CRP today to evaluate for potential causes of her tachycardia.  I encouraged her to increase her fluid and salt intake and to avoid caffeine/alcohol.  If she develops symptoms, she should seek immediate medical attention.  If tachycardia persists, we may need to consider evaluating for potential drug-effects.  Tachycardia can be seen with serotonin syndrome, though Wanda [name] is not hypertensive nor has she had any fevers.  Tachycardia is also a side effect of Abilify, which would need to be explored further.  I am reluctant to escalate metoprolol further at this time given borderline low  baseline BP and orthostatic hypotension.  If no obvious cause of tachycardia is identified, we could consider ivabradine.  Follow-up: Return to clinic in 2 weeks.  Approximately 50 minutes were spend on this encounter, of which > 50% was spent face to face with the patient and personally performing limited bedside echocardiogram.  Nelva Bush, MD 07/14/2020 2:18 PM

## 2020-07-15 LAB — TSH: TSH: 0.932 u[IU]/mL (ref 0.450–4.500)

## 2020-07-15 LAB — CBC WITH DIFFERENTIAL/PLATELET
Basophils Absolute: 0 10*3/uL (ref 0.0–0.2)
Basos: 1 %
EOS (ABSOLUTE): 0.1 10*3/uL (ref 0.0–0.4)
Eos: 1 %
Hematocrit: 41.4 % (ref 34.0–46.6)
Hemoglobin: 14.4 g/dL (ref 11.1–15.9)
Immature Grans (Abs): 0 10*3/uL (ref 0.0–0.1)
Immature Granulocytes: 0 %
Lymphocytes Absolute: 2 10*3/uL (ref 0.7–3.1)
Lymphs: 24 %
MCH: 31.8 pg (ref 26.6–33.0)
MCHC: 34.8 g/dL (ref 31.5–35.7)
MCV: 91 fL (ref 79–97)
Monocytes Absolute: 0.6 10*3/uL (ref 0.1–0.9)
Monocytes: 8 %
Neutrophils Absolute: 5.3 10*3/uL (ref 1.4–7.0)
Neutrophils: 66 %
Platelets: 269 10*3/uL (ref 150–450)
RBC: 4.53 x10E6/uL (ref 3.77–5.28)
RDW: 12 % (ref 11.7–15.4)
WBC: 8 10*3/uL (ref 3.4–10.8)

## 2020-07-15 LAB — COMPREHENSIVE METABOLIC PANEL
ALT: 18 IU/L (ref 0–32)
AST: 26 IU/L (ref 0–40)
Albumin/Globulin Ratio: 1.5 (ref 1.2–2.2)
Albumin: 4.3 g/dL (ref 3.8–4.8)
Alkaline Phosphatase: 82 IU/L (ref 44–121)
BUN/Creatinine Ratio: 14 (ref 12–28)
BUN: 9 mg/dL (ref 8–27)
Bilirubin Total: 0.8 mg/dL (ref 0.0–1.2)
CO2: 19 mmol/L — ABNORMAL LOW (ref 20–29)
Calcium: 10 mg/dL (ref 8.7–10.3)
Chloride: 100 mmol/L (ref 96–106)
Creatinine, Ser: 0.63 mg/dL (ref 0.57–1.00)
GFR calc Af Amer: 110 mL/min/{1.73_m2} (ref >59)
GFR calc non Af Amer: 95 mL/min/{1.73_m2} (ref >59)
Globulin, Total: 2.9 g/dL (ref 1.5–4.5)
Glucose: 102 mg/dL — ABNORMAL HIGH (ref 65–99)
Potassium: 4.1 mmol/L (ref 3.5–5.2)
Sodium: 138 mmol/L (ref 134–144)
Total Protein: 7.2 g/dL (ref 6.0–8.5)

## 2020-07-15 LAB — MAGNESIUM: Magnesium: 1.7 mg/dL (ref 1.6–2.3)

## 2020-07-15 LAB — C-REACTIVE PROTEIN: CRP: 1 mg/L (ref 0–10)

## 2020-07-15 LAB — SEDIMENTATION RATE: Sed Rate: 3 mm/hr (ref 0–40)

## 2020-07-16 ENCOUNTER — Other Ambulatory Visit: Payer: Self-pay

## 2020-07-16 ENCOUNTER — Telehealth (INDEPENDENT_AMBULATORY_CARE_PROVIDER_SITE_OTHER): Payer: Medicare Other | Admitting: Psychiatry

## 2020-07-16 ENCOUNTER — Encounter: Payer: Self-pay | Admitting: Psychiatry

## 2020-07-16 DIAGNOSIS — F411 Generalized anxiety disorder: Secondary | ICD-10-CM

## 2020-07-16 DIAGNOSIS — R000 Tachycardia, unspecified: Secondary | ICD-10-CM

## 2020-07-16 DIAGNOSIS — F5101 Primary insomnia: Secondary | ICD-10-CM

## 2020-07-16 DIAGNOSIS — F4010 Social phobia, unspecified: Secondary | ICD-10-CM | POA: Diagnosis not present

## 2020-07-16 DIAGNOSIS — F331 Major depressive disorder, recurrent, moderate: Secondary | ICD-10-CM | POA: Diagnosis not present

## 2020-07-16 MED ORDER — ARIPIPRAZOLE 5 MG PO TABS: 2.5000 mg | ORAL_TABLET | Freq: Every day | ORAL | 0 refills | Status: DC

## 2020-07-16 NOTE — Progress Notes (Signed)
Virtual Visit via Telephone Note  I connected with Wanda [name] on 07/16/20 at 10:00 AM EST by telephone and verified that I am speaking with the correct person using two identifiers.  Location Provider Location : [facility] Patient Location : Home  Participants: Patient , Provider   I discussed the limitations, risks, security and privacy concerns of performing an evaluation and management service by telephone and the availability of in person appointments. I also discussed with the patient that there may be a patient responsible charge related to this service. The patient expressed understanding and agreed to proceed.   I discussed the assessment and treatment plan with the patient. The patient was provided an opportunity to ask questions and all were answered. The patient agreed with the plan and demonstrated an understanding of the instructions.   The patient was advised to call back or seek an in-person evaluation if the symptoms worsen or if the condition fails to improve as anticipated.   [place] MD OP Progress Note  07/16/2020 8:59 PM Wanda [name]  MRN:  [id number]  Chief Complaint:  Chief Complaint    Follow-up     HPI: Wanda [name] is a 65 year old Caucasian female, widowed, on SSI, lives in [place] has a history of GAD, MDD, social anxiety disorder, SVT, orthostatic hypotension, hyperlipidemia, bereavement was evaluated by telemedicine today.  Patient today reports she recently felt dizzy, confused and  had palpitation as well as low blood pressure.  She reports she was seen by her cardiologist Dr.End.  She has upcoming appointments scheduled for follow-up.  Patient reports she does not want to stay on the Ambien.  She reports the Ambien could be causing her to be more confused. Patient however currently does not appear to be confused and was able to answer questions appropriately.  She appeared to be alert oriented to person place time and situation.  Patient continues to  grieve the loss of her daughter who passed away recently.  Patient was tearful in session today.  Patient denies any suicidality, homicidality or perceptual disturbances.  Patient is compliant on her other medications.   She continues to be in psychotherapy sessions.  Reports therapy sessions are beneficial.  Visit Diagnosis:    ICD-10-CM   1. GAD (generalized anxiety disorder)  F41.1   2. MDD (major depressive disorder), recurrent episode, moderate (HCC)  F33.1 ARIPiprazole (ABILIFY) 5 MG tablet  3. Social anxiety disorder  F40.10   4. Primary insomnia  F51.01   5. Tachycardia  R00.0     Past Psychiatric History: I have reviewed past psychiatric history from my progress note on 08/21/2017.  Past trials of Zoloft, Cymbalta, Effexor, Paxil, Prozac, Wellbutrin, Xanax, prazosin, Seroquel, trazodone, doxepin  Past Medical History:  Past Medical History:  Diagnosis Date  . Anxiety   . Basal cell carcinoma 05/03/2016   Left deltoid. Superficial.  . Basal cell carcinoma 05/03/2016   Left distal lat. tricep. Nodular.  . Borderline diabetes   . BRONCHITIS, ACUTE WITH BRONCHOSPASM 06/25/2010   Qualifier: Diagnosis of  By: Alveta Heimlich MD, Cornelia Copa    . Chronic prescription benzodiazepine use 10/18/2015  . Depression   . Dysrhythmia    tachycardia  . GASTROENTERITIS WITHOUT DEHYDRATION 05/14/2009   Qualifier: Diagnosis of  By: Deborra Medina MD, Tanja Port    . GERD (gastroesophageal reflux disease)   . Herpes   . 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 History of kidney stones   . Hyperlipidemia   . Hypertension   .  Irregular heart beat   . Pre-diabetes   . Sinus tachycardia 04/11/2017  . Skin cancer of face 08/01/2018  . 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 Dossie Arbour Serial number: [id number] Model number: [id number]     Past Surgical History:  Procedure Laterality Date  . ABDOMINAL HYSTERECTOMY     . APPENDECTOMY    . BACK SURGERY     X 3  . BREAST EXCISIONAL BIOPSY Right    surgical bx age 63   . COLONOSCOPY N/A 08/28/2013   Procedure: COLONOSCOPY;  Surgeon: Danie Binder, MD;  [facility];  Service: Endoscopy;  Laterality: N/A;  8:30 AM  . INTRAMEDULLARY (IM) NAIL INTERTROCHANTERIC Left 05/30/2016   Procedure: INTRAMEDULLARY (IM) NAIL INTERTROCHANTRIC;  Surgeon: Thornton Park, MD;  Location: [facility] ORS;  Service: Orthopedics;  Laterality: Left;  . SHOULDER SURGERY Right   . Spinal cord stimulator    . SPINAL CORD STIMULATOR REMOVAL N/A 01/12/2020   Procedure: REMOVAL SPINAL CORD STIMULATOR AND PULSE GENERATOR;  Surgeon: Deetta Perla, MD;  Location: [facility] ORS;  Service: Neurosurgery;  Laterality: N/A;  Local w/ MAC    Family Psychiatric History: I have reviewed family psychiatric history from my progress note on 08/21/2017.  Family History:  Family History  Problem Relation Age of Onset  . Diabetes Mother   . COPD Mother   . Heart disease Mother   . Diabetes Father   . COPD Father   . Heart disease Father   . Heart attack Father 13       CABG  . Alcohol abuse Daughter   . Depression Daughter   . Drug abuse Daughter   . Anxiety disorder Daughter   . Tuberculosis Maternal Aunt   . Cancer Paternal Aunt        breast  . Breast cancer Paternal Aunt   . Diabetes Paternal Grandmother   . Diabetes Sister   . Cancer Cousin   . Diabetes Cousin   . Colon cancer Neg Hx   . Sudden Cardiac Death Neg Hx     Social History: I have reviewed social history from my progress note on 08/21/2017. Social History   Socioeconomic History  . Marital status: Divorced    Spouse name: Not on file  . Number of children: 2  . Years of education: Not on file  . Highest education level: High school graduate  Occupational History    Comment: disabled  Tobacco Use  . Smoking status: Former Smoker    Packs/day: 0.25    Years: 14.00    Pack years: 3.50    Types: Cigarettes     Quit date: 1992    Years since quitting: 30.1  . Smokeless tobacco: Never Used  Vaping Use  . Vaping Use: Never used  Substance and Sexual Activity  . Alcohol use: Yes    Comment: occasional; once every 6 months  . Drug use: No  . Sexual activity: Yes    Birth control/protection: Surgical  Other Topics Concern  . Not on file  Social History Narrative  . Not on file   Social Determinants of Health   Financial Resource Strain: Not on file  Food Insecurity: Not on file  Transportation Needs: Not on file  Physical Activity: Not on file  Stress: Not on file  Social Connections: Not on file    Allergies:  Allergies  Allergen Reactions  . Levofloxacin Hives and Nausea And Vomiting  .  Tramadol Other (See Comments)    Severe headache  . Codeine Nausea Only    Slight nausea  . Mobic [Meloxicam] Other (See Comments)    Calf tightness  . Propoxyphene Hcl Rash    Metabolic Disorder Labs: Lab Results  Component Value Date   HGBA1C 5.4 05/29/2016   MPG 108 05/29/2016   No results found for: PROLACTIN Lab Results  Component Value Date   CHOL 198 07/08/2018   TRIG 371 (H) 07/08/2018   HDL 46 07/08/2018   CHOLHDL 4.3 07/08/2018   VLDL 59 (H) 09/29/2016   LDLCALC 78 07/08/2018   LDLCALC 106 (H) 09/29/2016   Lab Results  Component Value Date   TSH 0.932 07/14/2020   TSH 2.560 07/08/2018    Therapeutic Level Labs: No results found for: LITHIUM No results found for: VALPROATE No components found for:  CBMZ  Current Medications: Current Outpatient Medications  Medication Sig Dispense Refill  . ARIPiprazole (ABILIFY) 5 MG tablet Take 0.5 tablets (2.5 mg total) by mouth daily. Tapering off 30 tablet 0  . atorvastatin (LIPITOR) 40 MG tablet Take 40 mg by mouth daily. PM    . cetirizine (ZYRTEC) 10 MG tablet Take 10 mg by mouth daily.     . Cholecalciferol (VITAMIN D3) 5000 units TABS Take 5,000 Units by mouth daily.    Marland Kitchen dexlansoprazole (DEXILANT) 60 MG capsule Take 60  mg by mouth daily.     . famotidine (PEPCID) 40 MG tablet Take 40 mg by mouth daily.     . hydrOXYzine (VISTARIL) 25 MG capsule TAKE 1 CAPSULE BY MOUTH THREE TIMES DAILY AS NEEDED FOR SEVERE ANXIETY 90 capsule 0  . LORazepam (ATIVAN) 1 MG tablet Take 0.5 tablets (0.5 mg total) by mouth as directed. Take half tablet ( 0.5 mg) as needed during the day for severe anxiety attacks and half tablet ( 0.5 mg) at bedtime for severe anxiety attacks and sleep. 24 tablet 1  . metoprolol succinate (TOPROL-XL) 25 MG 24 hr tablet TAKE 1 TABLET(25 MG) BY MOUTH DAILY 90 tablet 0  . Multiple Vitamin (MULTIVITAMIN) tablet Take 1 tablet by mouth daily.    . naloxone (NARCAN) 0.4 MG/ML injection  (Patient not taking: Reported on 07/14/2020)    . Naloxone HCl 0.4 MG/0.4ML SOAJ Take if having symptoms of opioid overdose including extreme sleepiness or stopping breathing (Patient not taking: Reported on 07/14/2020) 0.4 mL 0  . potassium chloride (KLOR-CON) 20 MEQ packet Take 20 mEq by mouth 2 (two) times daily.    . risedronate (ACTONEL) 35 MG tablet Take 35 mg by mouth every 7 (seven) days.     . Vilazodone HCl (VIIBRYD) 40 MG TABS DAILY 30 tablet 3   No current facility-administered medications for this visit.     Musculoskeletal: Strength & Muscle Tone: UTA Gait & Station: UTA Patient leans: N/A  Psychiatric Specialty Exam: Review of Systems  Cardiovascular: Positive for palpitations.  Neurological: Positive for light-headedness.  Psychiatric/Behavioral: Positive for dysphoric mood and sleep disturbance. The patient is nervous/anxious.   All other systems reviewed and are negative.   Last menstrual period 09/30/1991.There is no height or weight on file to calculate BMI.  General Appearance: UTA  Eye Contact:  UTA  Speech:  Clear and Coherent  Volume:  Normal  Mood:  Anxious and Depressed  Affect:  Tearful as heard over the phone  Thought Process:  Goal Directed and Descriptions of Associations: Intact   Orientation:  Full (Time, Place, and Person)  Thought  Content: Logical   Suicidal Thoughts:  No  Homicidal Thoughts:  No  Memory:  Immediate;   Fair Recent;   Fair Remote;   Fair  Judgement:  Fair  Insight:  Fair  Psychomotor Activity:  UTA  Concentration:  Concentration: Fair and Attention Span: Fair  Recall:  [employer] of Knowledge: Fair  Language: Fair  Akathisia:  No  Handed:  Right  AIMS (if indicated): UTA  Assets:  Communication Skills Desire for Improvement Housing  ADL's:  Intact  Cognition: WNL  Sleep:  Restless   Screenings: PHQ2-9   Flowsheet Row Video Visit from 04/16/2020 in [facility] Office Visit from 12/08/2019 in [place] Procedure visit from 07/24/2017 in [facility] Office Visit from 07/16/2017 in [facility] Procedure visit from 06/28/2017 in [facility]  PHQ-2 Total Score 2 0 0 0 0  PHQ-9 Total Score 10 - - - -    Flowsheet Row Video Visit from 07/16/2020 in [facility]  C-SSRS RISK CATEGORY No Risk       Assessment and Plan: NATOSHA BOU is a 65 year old Caucasian female, widowed, lives in [place], has a history of depression, anxiety, chronic pain, SVT was evaluated by telemedicine today.  Patient is currently struggling with possible adverse side effects to medication as well as continues to have anxiety, sadness and is currently grieving.  Plan as noted below.  Plan MDD-unstable Continue Viibryd 40 mg p.o. daily However since patient is currently having tachycardia, will reduce Abilify to 2.5 mg p.o. daily. Patient advised to continue CBT Patient advised to come in to the office for evaluation in a few days.  GAD-unstable Increase lorazepam to 0.5 mg p.o. twice daily until she is seen back in the  office. Long-term plan is to taper her off. Viibryd 40 mg p.o. daily  Social anxiety disorder-unstable Continue CBT  Insomnia-unstable Discontinue Ambien for side effects. Will not add a new medication today. Patient was advised to take a higher dosage of lorazepam which should also help with her sleep.  I have reviewed notes per Dr. Leandra Kern 07/14/2020-cardiologist- Heart rate-144, blood pressure 110/80 at that visit, -Sinus tachycardia and orthostatic hypotension-an echocardiogram was performed which showed normal biventricular function without pericardial effusion.  Given lack of symptoms I have recommended that we check CBC, CMP, magnesium, TSH, ESR and CRP.  Encouraged to increase her fluid and salt intake and to avoid caffeine/alcohol.  If tachycardia persists, we may need to consider evaluating for potential drug effects.  Tachycardia can be seen with serotonin syndrome.  Tachycardia is also a side effect of the Abilify.'  Follow-up in clinic in person-in 10 days from now.  I have spent atleast 25 minutes non face to face with patient today. More than 50 % of the time was spent for preparing to see the patient ( e.g., review of test, records ), ordering medications and test ,psychoeducation and supportive psychotherapy and care coordination,as well as documenting clinical information in electronic health record. This note was generated in part or whole with voice recognition software. Voice recognition is usually quite accurate but there are transcription errors that can and very often do occur. I apologize for any typographical errors that were not detected and corrected.        Ursula Alert, MD 07/16/2020, 8:59 PM

## 2020-07-26 ENCOUNTER — Ambulatory Visit: Payer: Self-pay | Admitting: Licensed Clinical Social Worker

## 2020-07-26 ENCOUNTER — Other Ambulatory Visit: Payer: Self-pay | Admitting: Psychiatry

## 2020-07-26 DIAGNOSIS — F411 Generalized anxiety disorder: Secondary | ICD-10-CM

## 2020-07-29 ENCOUNTER — Encounter: Payer: Self-pay | Admitting: Physician Assistant

## 2020-07-29 ENCOUNTER — Other Ambulatory Visit: Payer: Self-pay

## 2020-07-29 ENCOUNTER — Ambulatory Visit: Payer: Medicare Other | Admitting: Medical

## 2020-07-29 ENCOUNTER — Ambulatory Visit (INDEPENDENT_AMBULATORY_CARE_PROVIDER_SITE_OTHER): Payer: Medicare Other

## 2020-07-29 VITALS — BP 113/80 | HR 103 | Ht 61.0 in | Wt 146.0 lb

## 2020-07-29 DIAGNOSIS — R000 Tachycardia, unspecified: Secondary | ICD-10-CM

## 2020-07-29 DIAGNOSIS — I951 Orthostatic hypotension: Secondary | ICD-10-CM | POA: Diagnosis not present

## 2020-07-29 NOTE — Progress Notes (Signed)
Cardiology Office Note:    Date:  07/29/2020   ID:  Wanda [name], DOB 09/13/1955, MRN [id number]  PCP:  Nelva Bush, MD  [place] Cardiologist:  No primary care provider on file.  [facility] Electrophysiologist:  None   Referring MD: Barbaraann Boys, MD   Chief Complaint: 2 week follow-up  History of Present Illness:    Wanda [name] is a 65 y.o. female with a hx of orthostatic hypotension, hyperlipidemia, PSVT, anxiety and depression resents for 2-week follow-up.  The patient had an echo in January 2018 that showed LVEF 60 to 65%.  She had US carotid bilateral that showed mild bilateral carotid atherosclerotic vascular disease, no flow-limiting stenosis.  Patient had a heart monitor January 2018 showing predominantly sinus rhythm with rare PACs and PVCs as well as paroxysmal SVT.  She has been seen by Dr. Caryl Comes in the past.  Also has a history of sinus tach and palpitations on metoprolol, which improved symptoms.  Patient was last seen 07/14/2020 for follow-up of lightheadedness and tachycardia.  She reported some dyspnea on exertion. Noted to be tachycardic and with orthostatic hypotension. A bedside echo was performed which showed normal biventricular function without pericardial effusion and no significant valve disease.  Labs were drawn.  Also on Abilify, which can increase heart rate. He was reluctant to escalate metoprolol due to borderline low baseline blood pressure.  Can consider ivabradine.  Today, the patient reports she is doing about the same. She brought in her records of her Bps and it shows heart rate 100-120s with normal Bps, occasionally soft. She is completely asymptomatic. No chest pain, palpitations, sob. Heart rate here is elevated in the office today, 103 bpm on  EKG. At this point we are unsure etiology of sinus tachycardia. Denies lower leg edema, orthopnea, pnd, fever, chills, nasuea, vomiting, lower leg pain. She reports normal eating and drinking, she  has increased her salt intake as instructed. She lives by herself and can perform ADLs. Most meals are cooked at home. She does not do any formal activity.    Past Medical History:  Diagnosis Date  . Anxiety   . Basal cell carcinoma 05/03/2016   Left deltoid. Superficial.  . Basal cell carcinoma 05/03/2016   Left distal lat. tricep. Nodular.  . Borderline diabetes   . BRONCHITIS, ACUTE WITH BRONCHOSPASM 06/25/2010   Qualifier: Diagnosis of  By: Alveta Heimlich MD, Cornelia Copa    . Chronic prescription benzodiazepine use 10/18/2015  . Depression   . Dysrhythmia    tachycardia  . GASTROENTERITIS WITHOUT DEHYDRATION 05/14/2009   Qualifier: Diagnosis of  By: Deborra Medina MD, Tanja Port    . GERD (gastroesophageal reflux disease)   . Herpes   . 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 History of kidney stones   . Hyperlipidemia   . Hypertension   . Irregular heart beat   . Pre-diabetes   . Sinus tachycardia 04/11/2017  . Skin cancer of face 08/01/2018  . 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 Dossie Arbour Serial number: [id number] Model number: [id number]     Past Surgical History:  Procedure Laterality Date  . ABDOMINAL HYSTERECTOMY    . APPENDECTOMY    . BACK SURGERY     X 3  . BREAST EXCISIONAL BIOPSY Right    surgical bx age 61   . COLONOSCOPY N/A 08/28/2013   Procedure: COLONOSCOPY;  Surgeon: Danie Binder, MD;  [facility];  Service: Endoscopy;  Laterality: N/A;  8:30 AM  . INTRAMEDULLARY (IM) NAIL INTERTROCHANTERIC Left 05/30/2016   Procedure: INTRAMEDULLARY (IM) NAIL INTERTROCHANTRIC;  Surgeon: Thornton Park, MD;  Location: [facility] ORS;  Service: Orthopedics;  Laterality: Left;  . SHOULDER SURGERY Right   . Spinal cord stimulator    . SPINAL CORD STIMULATOR REMOVAL N/A 01/12/2020   Procedure: REMOVAL SPINAL CORD STIMULATOR AND PULSE GENERATOR;  Surgeon: Deetta Perla, MD;  Location: [facility] ORS;   Service: Neurosurgery;  Laterality: N/A;  Local w/ MAC    Current Medications: Current Meds  Medication Sig  . ARIPiprazole (ABILIFY) 5 MG tablet Take 0.5 tablets (2.5 mg total) by mouth daily. Tapering off  . atorvastatin (LIPITOR) 40 MG tablet Take 40 mg by mouth daily. PM  . cetirizine (ZYRTEC) 10 MG tablet Take 10 mg by mouth daily.   . Cholecalciferol (VITAMIN D3) 5000 units TABS Take 5,000 Units by mouth daily.  Marland Kitchen dexlansoprazole (DEXILANT) 60 MG capsule Take 60 mg by mouth daily.   . famotidine (PEPCID) 40 MG tablet Take 40 mg by mouth daily.   . hydrOXYzine (VISTARIL) 25 MG capsule TAKE 1 CAPSULE BY MOUTH THREE TIMES DAILY AS NEEDED FOR SEVERE ANXIETY  . LORazepam (ATIVAN) 1 MG tablet Take 0.5 tablets (0.5 mg total) by mouth as directed. Take half tablet ( 0.5 mg) as needed during the day for severe anxiety attacks and half tablet ( 0.5 mg) at bedtime for severe anxiety attacks and sleep.  . metoprolol succinate (TOPROL-XL) 25 MG 24 hr tablet TAKE 1 TABLET(25 MG) BY MOUTH DAILY  . Multiple Vitamin (MULTIVITAMIN) tablet Take 1 tablet by mouth daily.  . Naloxone HCl 0.4 MG/0.4ML SOAJ Take if having symptoms of opioid overdose including extreme sleepiness or stopping breathing  . potassium chloride (KLOR-CON) 20 MEQ packet Take 20 mEq by mouth 2 (two) times daily.  . risedronate (ACTONEL) 35 MG tablet Take 35 mg by mouth every 7 (seven) days.   . Vilazodone HCl (VIIBRYD) 40 MG TABS DAILY     Allergies:   Levofloxacin, Tramadol, Codeine, Mobic [meloxicam], and Propoxyphene hcl   Social History   Socioeconomic History  . Marital status: Divorced    Spouse name: Not on file  . Number of children: 2  . Years of education: Not on file  . Highest education level: High school graduate  Occupational History    Comment: disabled  Tobacco Use  . Smoking status: Former Smoker    Packs/day: 0.25    Years: 14.00    Pack years: 3.50    Types: Cigarettes    Quit date: 1992    Years  since quitting: 30.1  . Smokeless tobacco: Never Used  Vaping Use  . Vaping Use: Never used  Substance and Sexual Activity  . Alcohol use: Yes    Comment: occasional; once every 6 months  . Drug use: No  . Sexual activity: Yes    Birth control/protection: Surgical  Other Topics Concern  . Not on file  Social History Narrative  . Not on file   Social Determinants of Health   Financial Resource Strain: Not on file  Food Insecurity: Not on file  Transportation Needs: Not on file  Physical Activity: Not on file  Stress: Not on file  Social Connections: Not on file     Family History: The patient's family history includes Alcohol abuse in her daughter; Anxiety disorder in her daughter; Breast cancer in her paternal aunt; COPD in  her father and mother; Cancer in her cousin and paternal aunt; Depression in her daughter; Diabetes in her cousin, father, mother, paternal grandmother, and sister; Drug abuse in her daughter; Heart attack (age of onset: 40) in her father; Heart disease in her father and mother; Tuberculosis in her maternal aunt. There is no history of Colon cancer or Sudden Cardiac Death.  ROS:   Please see the history of present illness.     All other systems reviewed and are negative.  EKGs/Labs/Other Studies Reviewed:    The following studies were reviewed today:  Echo 05/2016 - Procedure narrative: Transthoracic echocardiography. The study  was technically difficult.  - Left ventricle: Systolic function was normal. The estimated  ejection fraction was in the range of 60% to 65%.  - Aortic valve: Valve area (Vmax): 2.13 cm^2.   Long term monitor 05/2016   The patient was monitored for 13 days, 20 hours.  The predominant rhythm was sinus with an average rate of 84 bpm (range 52-170 bpm).  Rare PACs and atrial couplets were identified. Rare isolated PVCs were also seen.  There were 5 episodes of supraventricular tachycardia lasting up to 10.8 seconds with a  maximal rate of 152 bpm. There were no sustained arrhythmias or prolonged pauses.  There were no patient triggered episodes.   Predominantly sinus rhythm with rare PACs and PVCs, as well as paroxysmal SVT.   EKG:  EKG is  ordered today.  The ekg ordered today demonstrates ST, 103bpm, nonspecific T wave changes  Recent Labs: 07/14/2020: ALT 18; BUN 9; Creatinine, Ser 0.63; Hemoglobin 14.4; Magnesium 1.7; Platelets 269; Potassium 4.1; Sodium 138; TSH 0.932  Recent Lipid Panel    Component Value Date/Time   CHOL 198 07/08/2018 1018   TRIG 371 (H) 07/08/2018 1018   HDL 46 07/08/2018 1018   CHOLHDL 4.3 07/08/2018 1018   CHOLHDL 5.0 09/29/2016 0731   VLDL 59 (H) 09/29/2016 0731   LDLCALC 78 07/08/2018 1018   LDLDIRECT 197.3 02/01/2010 0907     Risk Assessment/Calculations:       Physical Exam:    VS:  BP 113/80 (BP Location: Left Arm, Patient Position: Sitting, Cuff Size: Normal)   Pulse (!) 103   Ht _0  (1.549 m)   Wt 146 lb (66.2 kg)   LMP 09/30/1991 (Approximate) Comment: 1993  SpO2 97%   BMI 27.59 kg/m     Wt Readings from Last 3 Encounters:  07/29/20 146 lb (66.2 kg)  07/14/20 145 lb 2 oz (65.8 kg)  01/01/20 144 lb (65.3 kg)     GEN:  Well nourished, well developed in no acute distress HEENT: Normal NECK: No JVD; No carotid bruits LYMPHATICS: No lymphadenopathy CARDIAC: tachycardic, no murmurs, rubs, gallops RESPIRATORY:  Clear to auscultation without rales, wheezing or rhonchi  ABDOMEN: Soft, non-tender, non-distended MUSCULOSKELETAL:  No edema; No deformity  SKIN: Warm and dry NEUROLOGIC:  Alert and oriented x 3 PSYCHIATRIC:  Normal affect   ASSESSMENT:    1. Sinus tachycardia   2. Orthostatic hypotension    PLAN:    In order of problems listed above:  Sinus tachycardia Patient remains in sinus tachycardia today by EKG. Review of home readings show good blood pressures and elevated heart rates 100-120s. Patient reports she is completely  asymptomatic. She denies any recent orthostatic symptoms. Nothing has changed in the last few weeks. Prior labs were unremarkable. Bedside echo at the last visit showed normal LV function and no significant valvular abnormality. I will get  a 2 week heart monitor and see her back after that. Will discuss echo with Dr. Saunders Revel, since there is no official reading in the chart. I will see back after heart monitor. Since EF is normal, can also consider diltiazem for heart rate control. Ultimately might need EP visit.   Orthostatic hypotension BP here today 113/80. She has increased her salt intake. She denies any recent orthostatic symptoms.   Disposition: Follow up in 3 week(s) with MD/PA     Signed, Lavelle Akel Ninfa Meeker, PA-C  07/29/2020 8:50 AM    [facility]

## 2020-07-29 NOTE — Patient Instructions (Signed)
Medication Instructions:  Your physician recommends that you continue on your current medications as directed. Please refer to the Current Medication list given to you today.  *If you need a refill on your cardiac medications before your next appointment, please call your pharmacy*   Lab Work: None ordered   Testing/Procedures: Your physician has recommended that you wear a ZioXT monitor for 2 weeks. This monitor is a medical device that records the heart's electrical activity. Doctors most often use these monitors to diagnose arrhythmias. Arrhythmias are problems with the speed or rhythm of the heartbeat. The monitor is a small device applied to your chest. You can wear one while you do your normal daily activities. While wearing this monitor if you have any symptoms to push the button and record what you felt. Once you have worn this monitor for the period of time provider prescribed (Usually 14 days), you will return the monitor device in the postage paid box. Once it is returned they will download the data collected and provide [place] with a report which the provider will then review and we will call you with those results. Important tips:  1. Avoid showering during the first 24 hours of wearing the monitor. 2. Avoid excessive sweating to help maximize wear time. 3. Do not submerge the device, no hot tubs, and no swimming pools. 4. Keep any lotions or oils away from the patch. 5. After 24 hours you may shower with the patch on. Take brief showers with your back facing the shower head.  6. Do not remove patch once it has been placed because that will interrupt data and decrease adhesive wear time. 7. Push the button when you have any symptoms and write down what you were feeling. 8. Once you have completed wearing your monitor, remove and place into box which has postage paid and place in your outgoing mailbox.  9. If for some reason you have misplaced your box then call our office and we can  provide another box and/or mail it off for you.         Follow-Up: At [facility], you and your health needs are our priority.  As part of our continuing mission to provide you with exceptional heart care, we have created designated Provider Care Teams.  These Care Teams include your primary Cardiologist (physician) and Advanced Practice Providers (APPs -  Physician Assistants and Nurse Practitioners) who all work together to provide you with the care you need, when you need it.  We recommend signing up for the patient portal called "MyChart".  Sign up information is provided on this After Visit Summary.  MyChart is used to connect with patients for Virtual Visits (Telemedicine).  Patients are able to view lab/test results, encounter notes, upcoming appointments, etc.  Non-urgent messages can be sent to your provider as well.   To learn more about what you can do with MyChart, go to [email or web address].    Your next appointment:   5 week(s)  The format for your next appointment:   In Person  Provider:   You may see Dr. Saunders Revel or one of the following Advanced Practice Providers on your designated Care Team:    Murray Hodgkins, NP  Christell Faith, PA-C  Marrianne Mood, PA-C  Cadence [place], [place]  Laurann Montana, NP

## 2020-07-30 ENCOUNTER — Ambulatory Visit (INDEPENDENT_AMBULATORY_CARE_PROVIDER_SITE_OTHER): Payer: Medicare Other | Admitting: Psychiatry

## 2020-07-30 ENCOUNTER — Encounter: Payer: Self-pay | Admitting: Psychiatry

## 2020-07-30 VITALS — BP 124/85 | HR 120 | Temp 97.3°F

## 2020-07-30 DIAGNOSIS — F4010 Social phobia, unspecified: Secondary | ICD-10-CM

## 2020-07-30 DIAGNOSIS — F331 Major depressive disorder, recurrent, moderate: Secondary | ICD-10-CM | POA: Diagnosis not present

## 2020-07-30 DIAGNOSIS — R000 Tachycardia, unspecified: Secondary | ICD-10-CM

## 2020-07-30 DIAGNOSIS — F5101 Primary insomnia: Secondary | ICD-10-CM | POA: Diagnosis not present

## 2020-07-30 DIAGNOSIS — Z634 Disappearance and death of family member: Secondary | ICD-10-CM

## 2020-07-30 DIAGNOSIS — F411 Generalized anxiety disorder: Secondary | ICD-10-CM

## 2020-07-30 MED ORDER — ARIPIPRAZOLE 5 MG PO TABS: 2.5000 mg | ORAL_TABLET | ORAL | 0 refills | Status: DC

## 2020-07-30 NOTE — Progress Notes (Signed)
[place] MD OP Progress Note  07/30/2020 5:13 PM Wanda [name]  MRN:  [id number]  Chief Complaint:  Chief Complaint    Follow-up     HPI: Wanda [name] is a 64 year old Caucasian female, widowed, on SSI, lives in [place], has a history of GAD, MDD, social anxiety disorder, SVT, orthostatic hypotension, hyperlipidemia, bereavement was evaluated in office today.  Patient today appeared to be tearful.  She reports she continues to grieve the loss of her daughter who passed away in November 15, 2019.  Her daughter passed away from an accidental overdose while patient was on the phone with her.  Patient reports she is trying to take it one day at a time.  She reports she has not been able to have psychotherapy sessions since her health insurance plan changed.  Patient reports she is currently sleeping okay and is just taking the lorazepam.  Patient reports although she is struggling with grief she is still able to take care of herself, reports appetite is good and has been motivated to do things.  Patient denies any suicidality, homicidality or perceptual disturbances.  Patient is currently struggling with tachycardia, has been following up with cardiology and currently is on Holter monitor.  She has to go back for a follow-up on March 17.  Discussed with patient the effect of Abilify, which can also cause tachycardia and she was advised to reduce the dosage of Abilify last visit.  She is tolerating the lower dosage well however in spite of that her heart rate today is 120.  Patient denies any symptoms of chest pain, chest tightness, nausea, diaphoresis or other symptoms.  Patient agreeable to tapering off the Abilify.  Patient denies any other concerns today.  Visit Diagnosis:    ICD-10-CM   1. GAD (generalized anxiety disorder)  F41.1   2. MDD (major depressive disorder), recurrent episode, moderate (HCC)  F33.1 ARIPiprazole (ABILIFY) 5 MG tablet  3. Social anxiety disorder  F40.10   4. Primary  insomnia  F51.01   5. Bereavement  Z63.4   6. Tachycardia  R00.0     Past Psychiatric History: I have reviewed past psychiatric history from my progress note on 08/21/2017.  Past trials of Zoloft, Cymbalta, Effexor, Paxil, Prozac, Wellbutrin, Xanax, prazosin, Seroquel, trazodone, doxepin  Past Medical History:  Past Medical History:  Diagnosis Date  . Anxiety   . Basal cell carcinoma 05/03/2016   Left deltoid. Superficial.  . Basal cell carcinoma 05/03/2016   Left distal lat. tricep. Nodular.  . Borderline diabetes   . BRONCHITIS, ACUTE WITH BRONCHOSPASM 06/25/2010   Qualifier: Diagnosis of  By: Alveta Heimlich MD, Cornelia Copa    . Chronic prescription benzodiazepine use 10/18/2015  . Depression   . Dysrhythmia    tachycardia  . GASTROENTERITIS WITHOUT DEHYDRATION 05/14/2009   Qualifier: Diagnosis of  By: Deborra Medina MD, Tanja Port    . GERD (gastroesophageal reflux disease)   . Herpes   . 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 History of kidney stones   . Hyperlipidemia   . Hypertension   . Irregular heart beat   . Pre-diabetes   . Sinus tachycardia 04/11/2017  . Skin cancer of face 08/01/2018  . 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 Dossie Arbour Serial number: [id number] Model number: [id number]     Past Surgical History:  Procedure Laterality Date  . ABDOMINAL HYSTERECTOMY    . APPENDECTOMY    . BACK  SURGERY     X 3  . BREAST EXCISIONAL BIOPSY Right    surgical bx age 17   . COLONOSCOPY N/A 08/28/2013   Procedure: COLONOSCOPY;  Surgeon: Danie Binder, MD;  Location: [facility];  Service: Endoscopy;  Laterality: N/A;  8:30 AM  . INTRAMEDULLARY (IM) NAIL INTERTROCHANTERIC Left 05/30/2016   Procedure: INTRAMEDULLARY (IM) NAIL INTERTROCHANTRIC;  Surgeon: Thornton Park, MD;  Location: [facility] ORS;  Service: Orthopedics;  Laterality: Left;  . SHOULDER SURGERY Right   . Spinal cord stimulator    .  SPINAL CORD STIMULATOR REMOVAL N/A 01/12/2020   Procedure: REMOVAL SPINAL CORD STIMULATOR AND PULSE GENERATOR;  Surgeon: Deetta Perla, MD;  Location: [facility] ORS;  Service: Neurosurgery;  Laterality: N/A;  Local w/ MAC    Family Psychiatric History: I have reviewed family psychiatric history from my progress note on 08/21/2017  Family History:  Family History  Problem Relation Age of Onset  . Diabetes Mother   . COPD Mother   . Heart disease Mother   . Diabetes Father   . COPD Father   . Heart disease Father   . Heart attack Father 33       CABG  . Alcohol abuse Daughter   . Depression Daughter   . Drug abuse Daughter   . Anxiety disorder Daughter   . Tuberculosis Maternal Aunt   . Cancer Paternal Aunt        breast  . Breast cancer Paternal Aunt   . Diabetes Paternal Grandmother   . Diabetes Sister   . Cancer Cousin   . Diabetes Cousin   . Colon cancer Neg Hx   . Sudden Cardiac Death Neg Hx    Social History: Reviewed social history from my progress note on 08/21/2017 Social History   Socioeconomic History  . Marital status: Divorced    Spouse name: Not on file  . Number of children: 2  . Years of education: Not on file  . Highest education level: High school graduate  Occupational History    Comment: disabled  Tobacco Use  . Smoking status: Former Smoker    Packs/day: 0.25    Years: 14.00    Pack years: 3.50    Types: Cigarettes    Quit date: 1992    Years since quitting: 30.1  . Smokeless tobacco: Never Used  Vaping Use  . Vaping Use: Never used  Substance and Sexual Activity  . Alcohol use: Yes    Comment: occasional; once every 6 months  . Drug use: No  . Sexual activity: Yes    Birth control/protection: Surgical  Other Topics Concern  . Not on file  Social History Narrative  . Not on file   Social Determinants of Health   Financial Resource Strain: Not on file  Food Insecurity: Not on file  Transportation Needs: Not on file  Physical Activity: Not  on file  Stress: Not on file  Social Connections: Not on file    Allergies:  Allergies  Allergen Reactions  . Levofloxacin Hives and Nausea And Vomiting  . Tramadol Other (See Comments)    Severe headache  . Codeine Nausea Only    Slight nausea  . Mobic [Meloxicam] Other (See Comments)    Calf tightness  . Propoxyphene Hcl Rash    Metabolic Disorder Labs: Lab Results  Component Value Date   HGBA1C 5.4 05/29/2016   MPG 108 05/29/2016   No results found for: PROLACTIN Lab Results  Component Value Date  CHOL 198 07/08/2018   TRIG 371 (H) 07/08/2018   HDL 46 07/08/2018   CHOLHDL 4.3 07/08/2018   VLDL 59 (H) 09/29/2016   LDLCALC 78 07/08/2018   LDLCALC 106 (H) 09/29/2016   Lab Results  Component Value Date   TSH 0.932 07/14/2020   TSH 2.560 07/08/2018    Therapeutic Level Labs: No results found for: LITHIUM No results found for: VALPROATE No components found for:  CBMZ  Current Medications: Current Outpatient Medications  Medication Sig Dispense Refill  . atorvastatin (LIPITOR) 40 MG tablet Take 40 mg by mouth daily. PM    . cetirizine (ZYRTEC) 10 MG tablet Take 10 mg by mouth daily.     . Cholecalciferol (VITAMIN D3) 5000 units TABS Take 5,000 Units by mouth daily.    Marland Kitchen dexlansoprazole (DEXILANT) 60 MG capsule Take 60 mg by mouth daily.     . famotidine (PEPCID) 40 MG tablet Take 40 mg by mouth daily.     . hydrOXYzine (VISTARIL) 25 MG capsule TAKE 1 CAPSULE BY MOUTH THREE TIMES DAILY AS NEEDED FOR SEVERE ANXIETY 90 capsule 0  . LORazepam (ATIVAN) 1 MG tablet Take 0.5 tablets (0.5 mg total) by mouth as directed. Take half tablet ( 0.5 mg) as needed during the day for severe anxiety attacks and half tablet ( 0.5 mg) at bedtime for severe anxiety attacks and sleep. 24 tablet 1  . metoprolol succinate (TOPROL-XL) 25 MG 24 hr tablet TAKE 1 TABLET(25 MG) BY MOUTH DAILY 90 tablet 0  . Multiple Vitamin (MULTIVITAMIN) tablet Take 1 tablet by mouth daily.    . Naloxone  HCl 0.4 MG/0.4ML SOAJ Take if having symptoms of opioid overdose including extreme sleepiness or stopping breathing 0.4 mL 0  . potassium chloride (KLOR-CON) 20 MEQ packet Take 20 mEq by mouth 2 (two) times daily.    . Vilazodone HCl (VIIBRYD) 40 MG TABS DAILY 30 tablet 3  . ARIPiprazole (ABILIFY) 5 MG tablet Take 0.5 tablets (2.5 mg total) by mouth as directed. Start taking every other day for 3 days and stop . 30 tablet 0  . risedronate (ACTONEL) 35 MG tablet Take 35 mg by mouth every 7 (seven) days.  (Patient not taking: Reported on 07/30/2020)     No current facility-administered medications for this visit.     Musculoskeletal: Strength & Muscle Tone: UTA Gait & Station: normal Patient leans: N/A  Psychiatric Specialty Exam: Review of Systems  Psychiatric/Behavioral: Positive for dysphoric mood.  All other systems reviewed and are negative.   Blood pressure 124/85, pulse (!) 120, temperature (!) 97.3 F (36.3 C), temperature source Temporal, last menstrual period 09/30/1991.There is no height or weight on file to calculate BMI.  General Appearance: Casual  Eye Contact:  Fair  Speech:  Clear and Coherent  Volume:  Normal  Mood:  Depressed Grieving  Affect:  Tearful  Thought Process:  Goal Directed and Descriptions of Associations: Intact  Orientation:  Full (Time, Place, and Person)  Thought Content: Logical   Suicidal Thoughts:  No  Homicidal Thoughts:  No  Memory:  Immediate;   Fair Recent;   Fair Remote;   Fair  Judgement:  Fair  Insight:  Fair  Psychomotor Activity:  Normal  Concentration:  Concentration: Fair and Attention Span: Fair  Recall:  [employer] of Knowledge: Fair  Language: Fair  Akathisia:  No  Handed:  Right  AIMS (if indicated): UTA  Assets:  Communication Skills Desire for Improvement Housing Social Support  ADL's:  Intact  Cognition: WNL  Sleep:  Improving   Screenings: PHQ2-9   Flowsheet Row Office Visit from 07/30/2020 in [place] Video Visit from 04/16/2020 in [place] Office Visit from 12/08/2019 in [place] Procedure visit from 07/24/2017 in [place] Office Visit from 07/16/2017 in [facility]  PHQ-2 Total Score 4 2 0 0 0  PHQ-9 Total Score 9 10 -- -- --    [place] Office Visit from 07/30/2020 in [place] Video Visit from 07/16/2020 in [place] No Risk No Risk       Assessment and Plan: TANGIA PINARD is a 65 year old Caucasian female, widowed, lives in [place], has a history of depression, anxiety, chronic pain, SVT was evaluated in office today.  Patient is currently struggling with grief.  Patient with tachycardia will benefit from being tapered off of the Abilify since that can also contribute to it.  Discussed plan as noted below.  Plan MDD-improving Viibryd 40 mg p.o. daily Taper off Abilify.  Advised patient to take Abilify 2.5 mg every other day for the next 3 days and stop taking it.   GAD-improving Viibryd as prescribed Continue lorazepam 0.5 mg p.o. twice daily as needed.  Patient advised to limit use  Social anxiety disorder-improving Continue CBT.  Insomnia-improving Lorazepam as prescribed for now. Continue sleep hygiene techniques  Bereavement-unstable Provided information for [facility] [place]. Also advised to contact hospice for grief counseling.  Tachycardia - Unstable Currently on holter monitor. I have reviewed cardiology notes dated Ms.Kathlen Mody - dated 07/29/2020- Sinus tachycardia - Will get 2 week heart monitor." Will taper of Abilify today - not sure if this exacerbating her tachycardia.   Follow-up in clinic in 2 to 3 weeks or sooner if needed.  I have spent atleast 30  minutes with patient today which includes the time spent for preparing to see the patient ( e.g., review of test, records ), obtaining and to review and separately obtained history , ordering medications and test ,psychoeducation and supportive psychotherapy and care coordination,as well as documenting clinical information in electronic health record.   This note was generated in part or whole with voice recognition software. Voice recognition is usually quite accurate but there are transcription errors that can and very often do occur. I apologize for any typographical errors that were not detected and corrected.        Ursula Alert, MD 07/30/2020, 5:13 PM

## 2020-08-12 ENCOUNTER — Other Ambulatory Visit: Payer: Self-pay | Admitting: Psychiatry

## 2020-08-12 DIAGNOSIS — F411 Generalized anxiety disorder: Secondary | ICD-10-CM

## 2020-08-12 DIAGNOSIS — F3342 Major depressive disorder, recurrent, in full remission: Secondary | ICD-10-CM

## 2020-08-20 ENCOUNTER — Other Ambulatory Visit: Payer: Self-pay

## 2020-08-20 ENCOUNTER — Encounter: Payer: Self-pay | Admitting: Psychiatry

## 2020-08-20 ENCOUNTER — Telehealth (INDEPENDENT_AMBULATORY_CARE_PROVIDER_SITE_OTHER): Payer: Medicare Other | Admitting: Psychiatry

## 2020-08-20 DIAGNOSIS — F5101 Primary insomnia: Secondary | ICD-10-CM

## 2020-08-20 DIAGNOSIS — F411 Generalized anxiety disorder: Secondary | ICD-10-CM | POA: Diagnosis not present

## 2020-08-20 DIAGNOSIS — F331 Major depressive disorder, recurrent, moderate: Secondary | ICD-10-CM | POA: Diagnosis not present

## 2020-08-20 DIAGNOSIS — F4010 Social phobia, unspecified: Secondary | ICD-10-CM

## 2020-08-20 DIAGNOSIS — Z634 Disappearance and death of family member: Secondary | ICD-10-CM

## 2020-08-20 MED ORDER — ZOLPIDEM TARTRATE 5 MG PO TABS: 5.0000 mg | ORAL_TABLET | Freq: Every evening | ORAL | 0 refills | Status: DC | PRN

## 2020-08-20 MED ORDER — LORAZEPAM 1 MG PO TABS: 0.5000 mg | ORAL_TABLET | ORAL | 1 refills | Status: DC

## 2020-08-20 NOTE — Progress Notes (Signed)
Virtual Visit via Video Note  I connected with Wanda [name] on 08/20/20 at 10:00 AM EDT by a video enabled telemedicine application and verified that I am speaking with the correct person using two identifiers.  Location Provider Location : [facility] Patient Location : Home  Participants: Patient , Provider    I discussed the limitations of evaluation and management by telemedicine and the availability of in person appointments. The patient expressed understanding and agreed to proceed.   I discussed the assessment and treatment plan with the patient. The patient was provided an opportunity to ask questions and all were answered. The patient agreed with the plan and demonstrated an understanding of the instructions.   The patient was advised to call back or seek an in-person evaluation if the symptoms worsen or if the condition fails to improve as anticipated.   Mathews MD OP Progress Note  08/20/2020 10:33 AM ERSIE SAVINO  MRN:  [id number]  Chief Complaint:  Chief Complaint    Follow-up; Depression; grief     HPI: Wanda [name] is a 65 year old Caucasian female, widowed, on SSI, lives in [place], has a history of GAD, MDD, social anxiety disorder, SVT, orthostatic hypotension, hyperlipidemia, bereavement was evaluated by telemedicine today.  Patient today reports she continues to grieve the loss of her daughter who passed away in Nov 20, 2019.  Patient reports she continues to feel sad.  She has to push herself to get out of the house to do her shopping and other activities.  She reports she stays home and watches TV.  She reports some days are better than others.  She is compliant on the Viibryd.  Denies side effects.  She reports even though hydroxyzine is prescribed as as needed she has been taking it every single day.  She also uses lorazepam every day.  She reports since being on the Ambien sleep has improved some.  She denies side effects to medications.  She was able to taper  herself off of the Abilify.  Patient was able to check her blood pressure and heart rate during the session today and her heart rate has improved compared to how it was during her last office visit.  She reports that her heart rate is 100 b/m today.  She reports she completed Holter monitoring and mailed it back to her cardiologist.  She has upcoming appointment on April 7.  Patient denies any suicidality, homicidality or perceptual disturbances.  Patient denies any other concerns today.  Visit Diagnosis:    ICD-10-CM   1. GAD (generalized anxiety disorder)  F41.1 LORazepam (ATIVAN) 1 MG tablet  2. MDD (major depressive disorder), recurrent episode, moderate (HCC)  F33.1   3. Social anxiety disorder  F40.10   4. Primary insomnia  F51.01 zolpidem (AMBIEN) 5 MG tablet  5. Bereavement  Z63.4     Past Psychiatric History: I have reviewed past psychiatric history from my progress note on 08/21/2017.  Past trials of Zoloft, Cymbalta, Effexor, Paxil, Prozac, Wellbutrin,, prazosin, Seroquel, trazodone, doxepin  Past Medical History:  Past Medical History:  Diagnosis Date  . Anxiety   . Basal cell carcinoma 05/03/2016   Left deltoid. Superficial.  . Basal cell carcinoma 05/03/2016   Left distal lat. tricep. Nodular.  . Borderline diabetes   . BRONCHITIS, ACUTE WITH BRONCHOSPASM 06/25/2010   Qualifier: Diagnosis of  By: Alveta Heimlich MD, Cornelia Copa    . Chronic prescription benzodiazepine use 10/18/2015  . Depression   . Dysrhythmia    tachycardia  .  GASTROENTERITIS WITHOUT DEHYDRATION 05/14/2009   Qualifier: Diagnosis of  By: Deborra Medina MD, Tanja Port    . GERD (gastroesophageal reflux disease)   . Herpes   . 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 History of kidney stones   . Hyperlipidemia   . Hypertension   . Irregular heart beat   . Pre-diabetes   . Sinus tachycardia 04/11/2017  . Skin cancer of face 08/01/2018  . 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 Dossie Arbour Serial number: [id number] Model number: [id number]     Past Surgical History:  Procedure Laterality Date  . ABDOMINAL HYSTERECTOMY    . APPENDECTOMY    . BACK SURGERY     X 3  . BREAST EXCISIONAL BIOPSY Right    surgical bx age 56   . COLONOSCOPY N/A 08/28/2013   Procedure: COLONOSCOPY;  Surgeon: Danie Binder, MD;  Location: [facility];  Service: Endoscopy;  Laterality: N/A;  8:30 AM  . INTRAMEDULLARY (IM) NAIL INTERTROCHANTERIC Left 05/30/2016   Procedure: INTRAMEDULLARY (IM) NAIL INTERTROCHANTRIC;  Surgeon: Thornton Park, MD;  Location: [facility] ORS;  Service: Orthopedics;  Laterality: Left;  . SHOULDER SURGERY Right   . Spinal cord stimulator    . SPINAL CORD STIMULATOR REMOVAL N/A 01/12/2020   Procedure: REMOVAL SPINAL CORD STIMULATOR AND PULSE GENERATOR;  Surgeon: Deetta Perla, MD;  Location: [facility] ORS;  Service: Neurosurgery;  Laterality: N/A;  Local w/ MAC    Family Psychiatric History: I have reviewed family psychiatric history from my progress note on 08/21/2017  Family History:  Family History  Problem Relation Age of Onset  . Diabetes Mother   . COPD Mother   . Heart disease Mother   . Diabetes Father   . COPD Father   . Heart disease Father   . Heart attack Father 31       CABG  . Alcohol abuse Daughter   . Depression Daughter   . Drug abuse Daughter   . Anxiety disorder Daughter   . Tuberculosis Maternal Aunt   . Cancer Paternal Aunt        breast  . Breast cancer Paternal Aunt   . Diabetes Paternal Grandmother   . Diabetes Sister   . Cancer Cousin   . Diabetes Cousin   . Colon cancer Neg Hx   . Sudden Cardiac Death Neg Hx     Social History: Reviewed social history from my progress note on 08/21/2017 Social History   Socioeconomic History  . Marital status: Divorced    Spouse name: Not on file  . Number of children: 2  . Years of education: Not on file  . Highest education level: High  school graduate  Occupational History    Comment: disabled  Tobacco Use  . Smoking status: Former Smoker    Packs/day: 0.25    Years: 14.00    Pack years: 3.50    Types: Cigarettes    Quit date: 1992    Years since quitting: 30.2  . Smokeless tobacco: Never Used  Vaping Use  . Vaping Use: Never used  Substance and Sexual Activity  . Alcohol use: Yes    Comment: occasional; once every 6 months  . Drug use: No  . Sexual activity: Yes    Birth control/protection: Surgical  Other Topics Concern  . Not on file  Social History Narrative  . Not on file   Social Determinants of Health  Financial Resource Strain: Not on file  Food Insecurity: Not on file  Transportation Needs: Not on file  Physical Activity: Not on file  Stress: Not on file  Social Connections: Not on file    Allergies:  Allergies  Allergen Reactions  . Levofloxacin Hives and Nausea And Vomiting  . Tramadol Other (See Comments)    Severe headache  . Codeine Nausea Only    Slight nausea  . Mobic [Meloxicam] Other (See Comments)    Calf tightness  . Propoxyphene Hcl Rash    Metabolic Disorder Labs: Lab Results  Component Value Date   HGBA1C 5.4 05/29/2016   MPG 108 05/29/2016   No results found for: PROLACTIN Lab Results  Component Value Date   CHOL 198 07/08/2018   TRIG 371 (H) 07/08/2018   HDL 46 07/08/2018   CHOLHDL 4.3 07/08/2018   VLDL 59 (H) 09/29/2016   LDLCALC 78 07/08/2018   LDLCALC 106 (H) 09/29/2016   Lab Results  Component Value Date   TSH 0.932 07/14/2020   TSH 2.560 07/08/2018    Therapeutic Level Labs: No results found for: LITHIUM No results found for: VALPROATE No components found for:  CBMZ  Current Medications: Current Outpatient Medications  Medication Sig Dispense Refill  . zolpidem (AMBIEN) 5 MG tablet Take 1 tablet (5 mg total) by mouth at bedtime as needed for sleep. 30 tablet 0  . atorvastatin (LIPITOR) 40 MG tablet Take 40 mg by mouth daily. PM    .  cetirizine (ZYRTEC) 10 MG tablet Take 10 mg by mouth daily.     . Cholecalciferol (VITAMIN D3) 5000 units TABS Take 5,000 Units by mouth daily.    Marland Kitchen dexlansoprazole (DEXILANT) 60 MG capsule Take 60 mg by mouth daily.     . famotidine (PEPCID) 40 MG tablet Take 40 mg by mouth daily.     . hydrOXYzine (VISTARIL) 25 MG capsule TAKE 1 CAPSULE BY MOUTH THREE TIMES DAILY AS NEEDED FOR SEVERE ANXIETY 90 capsule 0  . LORazepam (ATIVAN) 1 MG tablet Take 0.5 tablets (0.5 mg total) by mouth as directed. Take half tablet ( 0.5 mg) daily once as needed for severe anxiety attacks only. 21 tablet 1  . metoprolol succinate (TOPROL-XL) 25 MG 24 hr tablet TAKE 1 TABLET(25 MG) BY MOUTH DAILY 90 tablet 0  . Multiple Vitamin (MULTIVITAMIN) tablet Take 1 tablet by mouth daily.    . Naloxone HCl 0.4 MG/0.4ML SOAJ Take if having symptoms of opioid overdose including extreme sleepiness or stopping breathing 0.4 mL 0  . potassium chloride (KLOR-CON) 20 MEQ packet Take 20 mEq by mouth 2 (two) times daily.    . risedronate (ACTONEL) 35 MG tablet Take 35 mg by mouth every 7 (seven) days.  (Patient not taking: Reported on 07/30/2020)    . Vilazodone HCl (VIIBRYD) 40 MG TABS DAILY 30 tablet 3   No current facility-administered medications for this visit.     Musculoskeletal: Strength & Muscle Tone: UTA Gait & Station: normal Patient leans: N/A  Psychiatric Specialty Exam: Review of Systems  Psychiatric/Behavioral: Positive for dysphoric mood and sleep disturbance.  All other systems reviewed and are negative.   Last menstrual period 09/30/1991.There is no height or weight on file to calculate BMI.   General Appearance: Casual  Eye Contact:  Fair  Speech:  Normal Rate  Volume:  Normal  Mood:  Depressed  Affect:  Congruent  Thought Process:  Goal Directed and Descriptions of Associations: Intact  Orientation:  Full (Time, Place,  and Person)  Thought Content: Logical   Suicidal Thoughts:  No  Homicidal Thoughts:   No  Memory:  Immediate;   Fair Recent;   Fair Remote;   Fair  Judgement:  Fair  Insight:  Fair  Psychomotor Activity:  Normal  Concentration:  Concentration: Good and Attention Span: Fair  Recall:  [employer] of Knowledge: Fair  Language: Fair  Akathisia:  No  Handed:  Right  AIMS (if indicated): UTA  Assets:  Communication Skills Desire for Improvement Housing Social Support  ADL's:  Intact  Cognition: WNL  Sleep:  Improving   Screenings: PHQ2-9   [place] Office Visit from 07/30/2020 in [facility] Video Visit from 04/16/2020 in [place] Office Visit from 12/08/2019 in [facility] Procedure visit from 07/24/2017 in [facility] [facility] Office Visit from 07/16/2017 in [facility]  PHQ-2 Total Score 4 2 0 0 0  PHQ-9 Total Score 9 10 -- -- --    [place] Office Visit from 07/30/2020 in [facility] Video Visit from 07/16/2020 in [place] No Risk No Risk       Assessment and Plan: MERRIT WAUGH is a 65 year old Caucasian female, widowed, lives in [place], has a history of depression, anxiety, chronic pain, SVT was telemedicine today.  Patient continues to grieve.  She will benefit from the following plan.  Plan MDD-unstable Viibryd 40 mg p.o. daily Discontinue Abilify for side effects. Patient advised to start psychotherapy sessions, she has upcoming appointment with therapist with hospice next week.  Encouraged compliance  GAD-improving Viibryd as prescribed Patient advised to reduce the dose of lorazepam to 0.5 mg daily as needed and to use it only a few times a week.  Social anxiety disorder-improving Patient will restart psychotherapy sessions. She also has hydroxyzine as needed available.  Prescribed  as hydroxyzine 25 mg 3 times a day as needed.  Patient advised to limit use and to use it only..  Discussed the risk of cardiac problems of the hydroxyzine as well as falls.  Insomnia-improving Ambien 5 mg p.o. nightly  Bereavement-unstable Patient will start grief counseling soon with hospice.  Patient advised to sign a release to obtain medical records from cardiology.  She recently completed Holter monitoring.  Once I review cardiology note will make further medication changes as needed.  This was discussed with patient.  Until then she will make use of psychotherapy sessions for her mood.  Follow-up in clinic in 3 to 4 weeks or sooner if needed.  This note was generated in part or whole with voice recognition software. Voice recognition is usually quite accurate but there are transcription errors that can and very often do occur. I apologize for any typographical errors that were not detected and corrected.      Ursula Alert, MD 08/20/2020, 10:33 AM

## 2020-08-24 ENCOUNTER — Other Ambulatory Visit: Payer: Self-pay | Admitting: Psychiatry

## 2020-08-24 DIAGNOSIS — F411 Generalized anxiety disorder: Secondary | ICD-10-CM

## 2020-08-29 ENCOUNTER — Other Ambulatory Visit: Payer: Self-pay | Admitting: Psychiatry

## 2020-08-29 DIAGNOSIS — F411 Generalized anxiety disorder: Secondary | ICD-10-CM

## 2020-09-02 ENCOUNTER — Ambulatory Visit: Payer: Medicare Other | Admitting: Physician Assistant

## 2020-09-02 ENCOUNTER — Encounter: Payer: Self-pay | Admitting: Physician Assistant

## 2020-09-02 ENCOUNTER — Other Ambulatory Visit: Payer: Self-pay

## 2020-09-02 VITALS — BP 120/80 | HR 104 | Ht 61.0 in | Wt 147.0 lb

## 2020-09-02 DIAGNOSIS — Z8639 Personal history of other endocrine, nutritional and metabolic disease: Secondary | ICD-10-CM

## 2020-09-02 DIAGNOSIS — I471 Supraventricular tachycardia, unspecified: Secondary | ICD-10-CM

## 2020-09-02 DIAGNOSIS — E781 Pure hyperglyceridemia: Secondary | ICD-10-CM

## 2020-09-02 DIAGNOSIS — E785 Hyperlipidemia, unspecified: Secondary | ICD-10-CM | POA: Diagnosis not present

## 2020-09-02 DIAGNOSIS — Z8679 Personal history of other diseases of the circulatory system: Secondary | ICD-10-CM

## 2020-09-02 DIAGNOSIS — F5101 Primary insomnia: Secondary | ICD-10-CM

## 2020-09-02 DIAGNOSIS — R0789 Other chest pain: Secondary | ICD-10-CM

## 2020-09-02 DIAGNOSIS — K219 Gastro-esophageal reflux disease without esophagitis: Secondary | ICD-10-CM

## 2020-09-02 DIAGNOSIS — R000 Tachycardia, unspecified: Secondary | ICD-10-CM

## 2020-09-02 DIAGNOSIS — Z634 Disappearance and death of family member: Secondary | ICD-10-CM

## 2020-09-02 NOTE — Progress Notes (Signed)
Office Visit    Patient Name: Wanda [name] Date of Encounter: 09/02/2020  PCP:  Barbaraann Boys, [place]  Cardiologist:  Nelva Bush, MD Advanced Practice Provider:  No care team member to display Electrophysiologist:  None  :[id number]}   Chief Complaint    Chief Complaint  Patient presents with  . Follow-up    5 Week follow up and review results from [facility]. Medications verbally reviewed with patient.     65 year old female with history of orthostatic hypotension, sinus tachycardia, hyperlipidemia, anxiety, depression, and she presents today for 1 month follow-up of palpitations and lightheadedness.  Past Medical History    Past Medical History:  Diagnosis Date  . Anxiety   . Basal cell carcinoma 05/03/2016   Left deltoid. Superficial.  . Basal cell carcinoma 05/03/2016   Left distal lat. tricep. Nodular.  . Borderline diabetes   . BRONCHITIS, ACUTE WITH BRONCHOSPASM 06/25/2010   Qualifier: Diagnosis of  By: Alveta Heimlich MD, Cornelia Copa    . Chronic prescription benzodiazepine use 10/18/2015  . Depression   . Dysrhythmia    tachycardia  . GASTROENTERITIS WITHOUT DEHYDRATION 05/14/2009   Qualifier: Diagnosis of  By: Deborra Medina MD, Tanja Port    . GERD (gastroesophageal reflux disease)   . Herpes   . 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 History of kidney stones   . Hyperlipidemia   . Hypertension   . Irregular heart beat   . Pre-diabetes   . Sinus tachycardia 04/11/2017  . Skin cancer of face 08/01/2018  . 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 Dossie Arbour Serial number: [id number] Model number: [id number]    Past Surgical History:  Procedure Laterality Date  . ABDOMINAL HYSTERECTOMY    . APPENDECTOMY    . BACK SURGERY     X 3  . BREAST EXCISIONAL BIOPSY Right    surgical bx age 2   . COLONOSCOPY N/A 08/28/2013   Procedure: COLONOSCOPY;   Surgeon: Danie Binder, MD;  Location: [facility];  Service: Endoscopy;  Laterality: N/A;  8:30 AM  . INTRAMEDULLARY (IM) NAIL INTERTROCHANTERIC Left 05/30/2016   Procedure: INTRAMEDULLARY (IM) NAIL INTERTROCHANTRIC;  Surgeon: Thornton Park, MD;  Location: [facility] ORS;  Service: Orthopedics;  Laterality: Left;  . SHOULDER SURGERY Right   . Spinal cord stimulator    . SPINAL CORD STIMULATOR REMOVAL N/A 01/12/2020   Procedure: REMOVAL SPINAL CORD STIMULATOR AND PULSE GENERATOR;  Surgeon: Deetta Perla, MD;  Location: [facility] ORS;  Service: Neurosurgery;  Laterality: N/A;  Local w/ MAC    Allergies  Allergies  Allergen Reactions  . Levofloxacin Hives and Nausea And Vomiting  . Tramadol Other (See Comments)    Severe headache  . Codeine Nausea Only    Slight nausea  . Mobic [Meloxicam] Other (See Comments)    Calf tightness  . Propoxyphene Hcl Rash    History of Present Illness    Wanda [name] is a 65 y.o. female with PMH as above.  65 year old female with history of orthostatic hypotension, hyperlipidemia, previously, anxiety, and depression who presents today for palpitations and lightheadedness.    She was seen by her primary cardiologist, Dr. Harrell Gave End, 1 year prior.  At that time, she reported some exertional dyspnea walking across the parking lot.   She was last seen by Cadence Kathlen Mody, PA-C. At that time she had a monitor completed that showed  ST with rare ectopy, 4 runs of brief PSVT.   Today, 09/02/2020, she returns to clinic and notes she is doing about the same since her previous clinic visit.  We reviewed her monitor results together with patient understanding.  She denies significant symptoms while wearing her monitor, as she usually does not feel her heart racing or skipping beats.  No presyncope or syncope.  She has intermittent, sharp, positional, and brief in duration chest pain that mainly occurs on her left side.  She mainly notes this pain when sitting still or laying  in bed.  CP somewhat vague in description.  She does note it feels better with certain positions.  She also notes that she occasionally feels short of breath for brief periods of time without clear triggers. She reports she walks each day.  She is drinking 1 cup of water per day and lots of Gatorade.  She understandably reports significant stress and grief, as her daughter passed away in 11/21/22.  She struggles to get sleep and notes fatigue.  She is working with a Teacher, music, who is worried about her medications interacting with certain cardiac medications or exacerbating her tachycardia.  Home Medications    Current Outpatient Medications on File Prior to Visit  Medication Sig Dispense Refill  . atorvastatin (LIPITOR) 40 MG tablet Take 40 mg by mouth daily. PM    . cetirizine (ZYRTEC) 10 MG tablet Take 10 mg by mouth daily.     . Cholecalciferol (VITAMIN D3) 5000 units TABS Take 5,000 Units by mouth daily.    Marland Kitchen dexlansoprazole (DEXILANT) 60 MG capsule Take 60 mg by mouth daily.     . famotidine (PEPCID) 40 MG tablet Take 40 mg by mouth daily.     . hydrOXYzine (VISTARIL) 25 MG capsule TAKE 1 CAPSULE BY MOUTH THREE TIMES DAILY AS NEEDED FOR SEVERE ANXIETY 90 capsule 0  . LORazepam (ATIVAN) 1 MG tablet Take 0.5 tablets (0.5 mg total) by mouth as directed. Take half tablet ( 0.5 mg) daily once as needed for severe anxiety attacks only. 21 tablet 1  . metoprolol succinate (TOPROL-XL) 25 MG 24 hr tablet TAKE 1 TABLET(25 MG) BY MOUTH DAILY 90 tablet 0  . Multiple Vitamin (MULTIVITAMIN) tablet Take 1 tablet by mouth daily.    . Naloxone HCl 0.4 MG/0.4ML SOAJ Take if having symptoms of opioid overdose including extreme sleepiness or stopping breathing 0.4 mL 0  . potassium chloride (KLOR-CON) 20 MEQ packet Take 20 mEq by mouth 2 (two) times daily.    . Vilazodone HCl (VIIBRYD) 40 MG TABS DAILY 30 tablet 3  . zolpidem (AMBIEN) 5 MG tablet Take 1 tablet (5 mg total) by mouth at bedtime as needed for sleep.  30 tablet 0   No current facility-administered medications on file prior to visit.    Review of Systems    She denies pnd, orthopnea, n, v, dizziness, syncope, edema, weight gain, or early satiety.  She reports sharp, brief, left-sided chest pain as described above and without clear triggers.  She reports rare episodes of racing heart rate/palpitations.  She reports occasional and brief episodes of shortness of breath without clear triggers.  She understandably has grief after losing her daughter in 21-Nov-2022. She has fatigue. She is not sleeping well.  All other systems reviewed and are otherwise negative except as noted above.  Physical Exam    VS:  BP 120/80 (BP Location: Left Arm, Patient Position: Sitting, Cuff Size: Normal)   Pulse (!) 104  Ht _0  (1.549 m)   Wt 147 lb (66.7 kg)   LMP 09/30/1991 (Approximate) Comment: 1993  SpO2 94%   BMI 27.78 kg/m  , BMI Body mass index is 27.78 kg/m. GEN: Well nourished, well developed, in no acute distress.  Facemask in place. HEENT: normal. Neck: Supple, no JVD, carotid bruits, or masses. Cardiac: Tachycardic but regular, no murmurs, rubs, or gallops. No clubbing, cyanosis, edema.  Radials/DP/PT 2+ and equal bilaterally.  Respiratory:  Respirations regular and unlabored, clear to auscultation bilaterally. GI: Soft, nontender, nondistended, BS + x 4. MS: no deformity or atrophy. Skin: warm and dry, no rash. Neuro:  Strength and sensation are intact. Psych: Normal affect.  Accessory Clinical Findings    ECG personally reviewed by me today -no EKG- no acute changes.  VITALS Reviewed today   Temp Readings from Last 3 Encounters:  01/12/20 98.3 F (36.8 C) (Temporal)  01/01/20 98 F (36.7 C) (Oral)  12/08/19 98.1 F (36.7 C) (Temporal)   BP Readings from Last 3 Encounters:  09/02/20 120/80  07/29/20 113/80  07/14/20 110/80   Pulse Readings from Last 3 Encounters:  09/02/20 (!) 104  07/29/20 (!) 103  07/14/20 (!) 144    Wt  Readings from Last 3 Encounters:  09/02/20 147 lb (66.7 kg)  07/29/20 146 lb (66.2 kg)  07/14/20 145 lb 2 oz (65.8 kg)     LABS  reviewed today   Lab Results  Component Value Date   WBC 8.0 07/14/2020   HGB 14.4 07/14/2020   HCT 41.4 07/14/2020   MCV 91 07/14/2020   PLT 269 07/14/2020   Lab Results  Component Value Date   CREATININE 0.63 07/14/2020   BUN 9 07/14/2020   NA 138 07/14/2020   K 4.1 07/14/2020   CL 100 07/14/2020   CO2 19 (L) 07/14/2020   Lab Results  Component Value Date   ALT 18 07/14/2020   AST 26 07/14/2020   ALKPHOS 82 07/14/2020   BILITOT 0.8 07/14/2020   Lab Results  Component Value Date   CHOL 198 07/08/2018   HDL 46 07/08/2018   LDLCALC 78 07/08/2018   LDLDIRECT 197.3 02/01/2010   TRIG 371 (H) 07/08/2018   CHOLHDL 4.3 07/08/2018    Lab Results  Component Value Date   HGBA1C 5.4 05/29/2016   Lab Results  Component Value Date   TSH 0.932 07/14/2020     STUDIES/PROCEDURES reviewed today   08/18/20 Cardiac monitor  The patient was monitored for 14 days.  The predominant rhythm was sinus with an average rate of 93 bpm (range 55-180 bpm).  There were rare PAC's and occasional PVC's (1.1% PVC burden).  Four atrial runs lasting up to 23.3 seconds occurred with a maximum rate of 174 bpm.  No sustained arrhythmia or prolonged pause was identified.  Patient triggered event corresponds to sinus rhythm with isolated PVC. Predominantly sinus rhythm with rare PAC's and occasional PVC's.  PSVT also noted (see details above).  Echo 05/2016 - Procedure narrative: Transthoracic echocardiography. The study  was technically difficult.  - Left ventricle: Systolic function was normal. The estimated  ejection fraction was in the range of 60% to 65%.  - Aortic valve: Valve area (Vmax): 2.13 cm^2.   Long term monitor 05/2016  The patient was monitored for 13 days, 20 hours.  The predominant rhythm was sinus with an average rate of 84 bpm  (range 52-170 bpm).  Rare PACs and atrial couplets were identified. Rare isolated PVCs were also  seen.  There were 5 episodes of supraventricular tachycardia lasting up to 10.8 seconds with a maximal rate of 152 bpm. There were no sustained arrhythmias or prolonged pauses.  There were no patient triggered episodes. Predominantly sinus rhythm with rare PACs and PVCs, as well as paroxysmal SVT.  Carotids 2018 IMPRESSION: Mild bilateral carotid atherosclerotic vascular disease. No flow limiting stenosis. Degree of stenosis less than 50% bilaterally. 2. Vertebral arteries are patent with antegrade flow.  Assessment & Plan    Sinus tachycardia PSVT, PACs, PVCs --Sinus tachycardia with patient report that she is relatively asymptomatic.  She denies any racing heart rate or palpitations .  Recent monitor as above with sinus tachycardia, rare PACs and PVCs, paroxysmal SVT.  Discussed fluid intake and caffeine.  She is remaining hydrated.  She denies any further orthostasis sx as below.  Previous labs have all been wnl, including potassium (she takes a daily KCl tab supplement). Given ST and CP as below, will order updated echo. Previous 2018 echo as above.  If ongoing room in BP as there is today, discussed trying diltiazem given she reports fatigue. Will defer for now. Of note, she is working with her psychiatrist regarding possible side effects of Abilify, as this medication can be associated with ST. Reviewed recommendations for heart healthy diet.  We also discussed increasing activity as tolerated, including use of exercise bands.  She will look into getting exercise bands in the near future. Continue Toprol.   Atypical chest pain --As described above in HPI.  Chest pain is relatively atypical and described as brief, sharp, and positional pain.  We will update an echo.  If EF reduced, will discuss further ischemic work-up at that time as indicated.  Previous echo in 2018 with normal EF as above.   Further recommendations pending echo.  Discussed increasing activity as tolerated and heart healthy diet.  Continue beta-blocker and statin.  HLD, goal LDL below 100 Hypertriglyceridemia --LDL 07/2018 189.  Recommend continue to check LDL/LFTs on annual basis.  Continue current statin.  Consider Vascepa in the future due to elevated triglycerides at 371.  Bilateral carotid artery disease, mild --No reported signs or symptoms of worsening carotid artery disease.  Previous carotids 2018 with less than 50% stenosis bilaterally.  Periodic imaging to monitor.  Recommend control LDL, BP, heart rate.  Continue statin.  GERD on PPI --Continue PPI.  No reported signs or symptoms of bleeding.  History of hypokalemia, chronic potassium supplement --Remains on a potassium supplement for history of hypokalemia.  Most recent labs show potassium WNL.  History of orthostatic hypotension, resolved --Resolution of symptoms as above.  Recommend continue hydration.  Call the office if return of symptoms.  BP today 120/80.  Insomnia/grief --Encouraged follow-up with psychiatrist/PCP with patient understanding.  Disposition: RTC after echo   Arvil Chaco, PA-C 09/02/2020

## 2020-09-02 NOTE — Patient Instructions (Signed)
Medication Instructions:  Your physician recommends that you continue on your current medications as directed. Please refer to the Current Medication list given to you today.  *If you need a refill on your cardiac medications before your next appointment, please call your pharmacy*   Lab Work: None ordered   Testing/Procedures:  Your physician has requested that you have an echocardiogram. Echocardiography is a painless test that uses sound waves to create images of your heart. It provides your doctor with information about the size and shape of your heart and how well your heart's chambers and valves are working. This procedure takes approximately one hour. There are no restrictions for this procedure.    Follow-Up: At [facility], you and your health needs are our priority.  As part of our continuing mission to provide you with exceptional heart care, we have created designated Provider Care Teams.  These Care Teams include your primary Cardiologist (physician) and Advanced Practice Providers (APPs -  Physician Assistants and Nurse Practitioners) who all work together to provide you with the care you need, when you need it.  We recommend signing up for the patient portal called "MyChart".  Sign up information is provided on this After Visit Summary.  MyChart is used to connect with patients for Virtual Visits (Telemedicine).  Patients are able to view lab/test results, encounter notes, upcoming appointments, etc.  Non-urgent messages can be sent to your provider as well.   To learn more about what you can do with MyChart, go to [email or web address].    Your next appointment:    Follow up after echo  The format for your next appointment:   In Person  Provider:   You may see Nelva Bush, MD or one of the following Advanced Practice Providers on your designated Care Team:     Marrianne Mood, [place]

## 2020-09-13 ENCOUNTER — Encounter: Payer: Self-pay | Admitting: Psychiatry

## 2020-09-13 ENCOUNTER — Telehealth (INDEPENDENT_AMBULATORY_CARE_PROVIDER_SITE_OTHER): Payer: Medicare Other | Admitting: Psychiatry

## 2020-09-13 ENCOUNTER — Other Ambulatory Visit: Payer: Self-pay

## 2020-09-13 DIAGNOSIS — F411 Generalized anxiety disorder: Secondary | ICD-10-CM

## 2020-09-13 DIAGNOSIS — F331 Major depressive disorder, recurrent, moderate: Secondary | ICD-10-CM

## 2020-09-13 DIAGNOSIS — F4010 Social phobia, unspecified: Secondary | ICD-10-CM

## 2020-09-13 DIAGNOSIS — F5101 Primary insomnia: Secondary | ICD-10-CM

## 2020-09-13 DIAGNOSIS — Z634 Disappearance and death of family member: Secondary | ICD-10-CM

## 2020-09-13 MED ORDER — ZALEPLON 5 MG PO CAPS: 5.0000 mg | ORAL_CAPSULE | Freq: Every evening | ORAL | 1 refills | Status: DC | PRN

## 2020-09-13 NOTE — Patient Instructions (Signed)
Zaleplon capsules What is this medicine? ZALEPLON (ZAL e plon) is used to treat insomnia. This medicine helps you to fall asleep. This medicine may be used for other purposes; ask your health care provider or pharmacist if you have questions. COMMON BRAND NAME(S): Sonata What should I tell my health care provider before I take this medicine? They need to know if you have any of these conditions:  depression  history of a drug or alcohol abuse problem  liver disease  lung or breathing disease  sleep-walking, driving, eating or other activity while not fully awake after taking a sleep medicine  suicidal thoughts  an unusual or allergic reaction to zaleplon, other medicines, foods, dyes, or preservatives  pregnant or trying to get pregnant  breast-feeding How should I use this medicine? Take this medicine by mouth with water. Take it as directed on the label. Do not use it more often than directed. There may be unused or extra doses in the bottle after you finish your treatment. Talk to your health care provider if you have questions about your dose. A special MedGuide will be given to you by the pharmacist with each prescription and refill. Be sure to read this information carefully each time. Talk to your health care provider about the use of this medicine in children. Special care may be needed. Overdosage: If you think you have taken too much of this medicine contact a poison control center or emergency room at once. NOTE: This medicine is only for you. Do not share this medicine with others. What if I miss a dose? This does not apply. This medicine should only be taken immediately before going to sleep. Do not take double or extra doses. What may interact with this medicine?  barbiturate medicines for inducing sleep or treating seizures  carbamazepine  certain medicines for allergies, like azatadine, clemastine, diphenhydramine  certain medicines for depression, anxiety, or  other emotional or psychiatric problems  certain medicines for pain  cimetidine  erythromycin  medicines for fungal infections like ketoconazole, fluconazole, or itraconazole  other medicines given for sleep  phenytoin  rifampin This list may not describe all possible interactions. Give your health care provider a list of all the medicines, herbs, non-prescription drugs, or dietary supplements you use. Also tell them if you smoke, drink alcohol, or use illegal drugs. Some items may interact with your medicine. What should I watch for while using this medicine? Visit your doctor or health care professional for regular checks on your progress. Keep a regular sleep schedule by going to bed at about the same time each night. Avoid caffeine-containing drinks in the evening hours. When sleep medicines are used every night for more than a few weeks, they may stop working. Talk to your doctor if you still have trouble sleeping. After taking this medicine, you may get up out of bed and do an activity that you do not know you are doing. The next morning, you may have no memory of this. Activities include driving a car ("sleep-driving"), making and eating food, talking on the phone, sexual activity, and sleep-walking. Serious injuries have occurred. Stop the medicine and call your doctor right away if you find out you have done any of these activities. Do not take this medicine if you have used alcohol that evening. Do not take it if you have taken another medicine for sleep. The risk of doing these sleep-related activities is higher. Do not take this medicine unless you are able to stay in  bed for a full night (7 to 8 hours) before you must be active again. You may have a decrease in mental alertness the day after use, even if you feel that you are fully awake. Tell your doctor if you will need to perform activities requiring full alertness, such as driving, the next day. Do not stand or sit up quickly  after taking this medicine, especially if you are an older patient. This reduces the risk of dizzy or fainting spells. If you or your family notice any changes in your behavior, such as new or worsening depression, thoughts of harming yourself, anxiety, other unusual or disturbing thoughts, or memory loss, call your doctor right away. After you stop taking this medicine, you may have trouble falling asleep. This is called rebound insomnia. This problem usually goes away on its own after 1 or 2 nights. What side effects may I notice from receiving this medicine? Side effects that you should report to your doctor or health care professional as soon as possible:  allergic reactions like skin rash, itching or hives, swelling of the face, lips, or tongue  breathing problems  changes in vision  confusion  depression, suicidal thoughts  feeling faint or lightheaded  hallucinations  hostility, restlessness, excitability  slurred speech  staggering, tremors  unusual activities while not fully awake like driving, eating, making phone calls  unusually weak or tired Side effects that usually do not require medical attention (report to your doctor or health care professional if they continue or are bothersome):  diarrhea  difficulty with coordination  loss of memory  nightmares  stomach upset This list may not describe all possible side effects. Call your doctor for medical advice about side effects. You may report side effects to FDA at 1-800-FDA-1088. Where should I keep my medicine? Keep out of the reach of children. This medicine can be abused. Keep your medicine in a safe place to protect it from theft. Do not share this medicine with anyone. Selling or giving away this medicine is dangerous and against the law. This medicine may cause accidental overdose and death if taken by other adults, children, or pets. Mix any unused medicine with a substance like cat litter or coffee  grounds. Then throw the medicine away in a sealed container like a sealed bag or a coffee can with a lid. Do not use the medicine after the expiration date. Store at room temperature between 20 and 25 degrees C (68 and 77 degrees F). Protect from light. NOTE: This sheet is a summary. It may not cover all possible information. If you have questions about this medicine, talk to your doctor, pharmacist, or health care provider.  2021 Elsevier/Gold Standard (2018-05-21 09:26:01)

## 2020-09-13 NOTE — Progress Notes (Signed)
Virtual Visit via Telephone Note  I connected with Wanda [name] on 09/13/20 at  2:40 PM EDT by telephone and verified that I am speaking with the correct person using two identifiers.  Location Provider Location : [facility] Patient Location : Home  Participants: Patient , Provider    I discussed the limitations, risks, security and privacy concerns of performing an evaluation and management service by telephone and the availability of in person appointments. I also discussed with the patient that there may be a patient responsible charge related to this service. The patient expressed understanding and agreed to proceed.   I discussed the assessment and treatment plan with the patient. The patient was provided an opportunity to ask questions and all were answered. The patient agreed with the plan and demonstrated an understanding of the instructions.   The patient was advised to call back or seek an in-person evaluation if the symptoms worsen or if the condition fails to improve as anticipated.   [place] MD OP Progress Note  09/13/2020 2:57 PM Wanda [name]  MRN:  [id number]  Chief Complaint:  Chief Complaint    Follow-up; Depression; Sleeping Problem     HPI: Wanda [name] is a 65 year old Caucasian female, widowed, on SSI, lives in [place], has a history of GAD, MDD, social anxiety disorder, SVT, orthostatic hypotension, hyperlipidemia, bereavement was evaluated by telemedicine today.  Patient today reports she continues to grieve the loss of her daughter who passed away in 29-Nov-2019.  As it gets closer to her death anniversary she reports she is having a lot of memories which can be overwhelming for her.  She continues to be in therapy with Ms. Christina Hussami which helps.  Patient reports she was attending grief groups however her computer broke down and she has not been able to do that ever since.  She is however trying to get her computer fixed.  She reports she continues to  struggle with sleep.  The Ambien does not help her with falling asleep and finally when she falls asleep after 3 or 4 hours she reports she feels as though she got beaten up when she wakes up.  Patient denies any suicidality, homicidality or perceptual disturbances.  She has been limiting the use of lorazepam and uses it 3-4 times a week only for anxiety attacks.  Patient reports appetite has been limited since the grocery prices are going up as well as her depression makes it difficult for her to eat at times.  She however has been spending time with her dog as well as go grocery shopping, tries to go walking.  She has been following up with her cardiologist and has upcoming echocardiogram.  Her heart rate has come down since the last time she was evaluated.  Patient denies any other concerns today.  Visit Diagnosis:    ICD-10-CM   1. GAD (generalized anxiety disorder)  F41.1   2. MDD (major depressive disorder), recurrent episode, moderate (HCC)  F33.1   3. Social anxiety disorder  F40.10   4. Primary insomnia  F51.01 zaleplon (SONATA) 5 MG capsule  5. Bereavement  Z63.4     Past Psychiatric History: I have reviewed past psychiatric history from my progress note on 08/21/2017.  Past trials of Zoloft, Cymbalta, Effexor, Paxil, Prozac, Wellbutrin, prazosin, Seroquel, trazodone, doxepin  Past Medical History:  Past Medical History:  Diagnosis Date  . Anxiety   . Basal cell carcinoma 05/03/2016   Left deltoid. Superficial.  . Basal cell  carcinoma 05/03/2016   Left distal lat. tricep. Nodular.  . Borderline diabetes   . BRONCHITIS, ACUTE WITH BRONCHOSPASM 06/25/2010   Qualifier: Diagnosis of  By: Alveta Heimlich MD, Cornelia Copa    . Chronic prescription benzodiazepine use 10/18/2015  . Depression   . Dysrhythmia    tachycardia  . GASTROENTERITIS WITHOUT DEHYDRATION 05/14/2009   Qualifier: Diagnosis of  By: Deborra Medina MD, Tanja Port    . GERD (gastroesophageal reflux disease)   . Herpes   . Hip fx, left,  closed, with nonunion, subsequent encounter 05/29/2016  . History of kidney stones   . Hyperlipidemia   . Hypertension   . Irregular heart beat   . Pre-diabetes   . Sinus tachycardia 04/11/2017  . Skin cancer of face 08/01/2018  . 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 Dossie Arbour Serial number: [id number] Model number: [id number]     Past Surgical History:  Procedure Laterality Date  . ABDOMINAL HYSTERECTOMY    . APPENDECTOMY    . BACK SURGERY     X 3  . BREAST EXCISIONAL BIOPSY Right    surgical bx age 22   . COLONOSCOPY N/A 08/28/2013   Procedure: COLONOSCOPY;  Surgeon: Danie Binder, MD;  Location: [facility];  Service: Endoscopy;  Laterality: N/A;  8:30 AM  . INTRAMEDULLARY (IM) NAIL INTERTROCHANTERIC Left 05/30/2016   Procedure: INTRAMEDULLARY (IM) NAIL INTERTROCHANTRIC;  Surgeon: Thornton Park, MD;  Location: [facility] ORS;  Service: Orthopedics;  Laterality: Left;  . SHOULDER SURGERY Right   . Spinal cord stimulator    . SPINAL CORD STIMULATOR REMOVAL N/A 01/12/2020   Procedure: REMOVAL SPINAL CORD STIMULATOR AND PULSE GENERATOR;  Surgeon: Deetta Perla, MD;  Location: [facility] ORS;  Service: Neurosurgery;  Laterality: N/A;  Local w/ MAC    Family Psychiatric History: Reviewed family psychiatric history from my progress note on 08/21/2017  Family History:  Family History  Problem Relation Age of Onset  . Diabetes Mother   . COPD Mother   . Heart disease Mother   . Diabetes Father   . COPD Father   . Heart disease Father   . Heart attack Father 38       CABG  . Alcohol abuse Daughter   . Depression Daughter   . Drug abuse Daughter   . Anxiety disorder Daughter   . Tuberculosis Maternal Aunt   . Cancer Paternal Aunt        breast  . Breast cancer Paternal Aunt   . Diabetes Paternal Grandmother   . Diabetes Sister   . Cancer Cousin   . Diabetes Cousin   . Colon cancer Neg Hx   . Sudden Cardiac Death  Neg Hx     Social History: Reviewed social history from my progress note on 08/21/2017 Social History   Socioeconomic History  . Marital status: Divorced    Spouse name: Not on file  . Number of children: 2  . Years of education: Not on file  . Highest education level: High school graduate  Occupational History    Comment: disabled  Tobacco Use  . Smoking status: Former Smoker    Packs/day: 0.25    Years: 14.00    Pack years: 3.50    Types: Cigarettes    Quit date: 1992    Years since quitting: 30.3  . Smokeless tobacco: Never Used  Vaping Use  . Vaping Use: Never used  Substance and Sexual Activity  . Alcohol use:  Yes    Comment: occasional; once every 6 months  . Drug use: No  . Sexual activity: Yes    Birth control/protection: Surgical  Other Topics Concern  . Not on file  Social History Narrative  . Not on file   Social Determinants of Health   Financial Resource Strain: Not on file  Food Insecurity: Not on file  Transportation Needs: Not on file  Physical Activity: Not on file  Stress: Not on file  Social Connections: Not on file    Allergies:  Allergies  Allergen Reactions  . Levofloxacin Hives and Nausea And Vomiting  . Tramadol Other (See Comments)    Severe headache  . Codeine Nausea Only    Slight nausea  . Mobic [Meloxicam] Other (See Comments)    Calf tightness  . Propoxyphene Hcl Rash    Metabolic Disorder Labs: Lab Results  Component Value Date   HGBA1C 5.4 05/29/2016   MPG 108 05/29/2016   No results found for: PROLACTIN Lab Results  Component Value Date   CHOL 198 07/08/2018   TRIG 371 (H) 07/08/2018   HDL 46 07/08/2018   CHOLHDL 4.3 07/08/2018   VLDL 59 (H) 09/29/2016   LDLCALC 78 07/08/2018   LDLCALC 106 (H) 09/29/2016   Lab Results  Component Value Date   TSH 0.932 07/14/2020   TSH 2.560 07/08/2018    Therapeutic Level Labs: No results found for: LITHIUM No results found for: VALPROATE No components found for:   CBMZ  Current Medications: Current Outpatient Medications  Medication Sig Dispense Refill  . zaleplon (SONATA) 5 MG capsule Take 1 capsule (5 mg total) by mouth at bedtime as needed for sleep. 15 capsule 1  . atorvastatin (LIPITOR) 40 MG tablet Take 40 mg by mouth daily. PM    . cetirizine (ZYRTEC) 10 MG tablet Take 10 mg by mouth daily.     . Cholecalciferol (VITAMIN D3) 5000 units TABS Take 5,000 Units by mouth daily.    Marland Kitchen dexlansoprazole (DEXILANT) 60 MG capsule Take 60 mg by mouth daily.     . famotidine (PEPCID) 40 MG tablet Take 40 mg by mouth daily.     . hydrOXYzine (VISTARIL) 25 MG capsule TAKE 1 CAPSULE BY MOUTH THREE TIMES DAILY AS NEEDED FOR SEVERE ANXIETY 90 capsule 0  . LORazepam (ATIVAN) 1 MG tablet Take 0.5 tablets (0.5 mg total) by mouth as directed. Take half tablet ( 0.5 mg) daily once as needed for severe anxiety attacks only. 21 tablet 1  . metoprolol succinate (TOPROL-XL) 25 MG 24 hr tablet TAKE 1 TABLET(25 MG) BY MOUTH DAILY 90 tablet 0  . Multiple Vitamin (MULTIVITAMIN) tablet Take 1 tablet by mouth daily.    . Naloxone HCl 0.4 MG/0.4ML SOAJ Take if having symptoms of opioid overdose including extreme sleepiness or stopping breathing 0.4 mL 0  . potassium chloride (KLOR-CON) 20 MEQ packet Take 20 mEq by mouth 2 (two) times daily.    . Vilazodone HCl (VIIBRYD) 40 MG TABS DAILY 30 tablet 3   No current facility-administered medications for this visit.     Musculoskeletal: Strength & Muscle Tone: UTA Gait & Station: UTA Patient leans: N/A  Psychiatric Specialty Exam: Review of Systems  Musculoskeletal: Positive for myalgias.  Psychiatric/Behavioral: Positive for dysphoric mood and sleep disturbance.  All other systems reviewed and are negative.   Last menstrual period 09/30/1991.There is no height or weight on file to calculate BMI.  General Appearance: UTA  Eye Contact:  UTA  Speech:  Clear and Coherent  Volume:  Normal  Mood:  Dysphoric  Affect:  UTA   Thought Process:  Goal Directed and Descriptions of Associations: Intact  Orientation:  Full (Time, Place, and Person)  Thought Content: Logical   Suicidal Thoughts:  No  Homicidal Thoughts:  No  Memory:  Immediate;   Fair Recent;   Fair Remote;   Fair  Judgement:  Fair  Insight:  Fair  Psychomotor Activity:  UTA  Concentration:  Concentration: Fair and Attention Span: Fair  Recall:  [employer] of Knowledge: Fair  Language: Fair  Akathisia:  No  Handed:  Right  AIMS (if indicated): UTA  Assets:  Communication Skills Desire for Improvement Housing Social Support  ADL's:  Intact  Cognition: WNL  Sleep:  Poor   Screenings: PHQ2-9   Flowsheet Row Office Visit from 07/30/2020 in [facility] Video Visit from 04/16/2020 in [facility] Office Visit from 12/08/2019 in [facility] Procedure visit from 07/24/2017 in [facility] Office Visit from 07/16/2017 in [facility]  PHQ-2 Total Score 4 2 0 0 0  PHQ-9 Total Score 9 10 -- -- --    [place] Office Visit from 07/30/2020 in [facility] Video Visit from 07/16/2020 in [facility] No Risk No Risk       Assessment and Plan: Wanda [name] is a 65 year old Caucasian female, widowed, lives in [place], has a history of depression, anxiety, chronic pain, SVT was evaluated by telemedicine today.  Patient continues to struggle with sleep problems and grief.  She will benefit from the following plan.  Plan MDD- some progress Viibryd 40 mg p.o. daily Continue CBT  GAD-improving Viibryd is prescribed Lorazepam 0.5 mg as needed for severe anxiety attacks only.  Patient advised to limit use  Social anxiety disorder-improving Continue CBT  Insomnia-unstable Discontinue Ambien  for lack of benefit Start Sonata 5 mg p.o. nightly  Bereavement-unstable Continue grief counseling as well as groups.  I have reviewed notes per cardiology-dated 09/02/2020- Ms. Visser PA -sinus tachycardia, PSVT, PA-C, PVC- patient denies racing heart rate and palpitations.  Recent monitor as above with sinus tachycardia, rare PACs, PVCs, paroxysmal SVT.  Discussed increasing activity as tolerated, including exercise plans, she will look into getting exercise bands in the near future.  Continue Toprol.'  Follow-up in clinic in 2 to 3 weeks or sooner if needed.   I have spent at least 20 minutes non face to face with patient today which includes the time spent for preparing to see the patient  (e.g., review of test, records), ordering medications and test, psychoeducation and supportive psychotherapy, care coordination as well as documenting clinical information in electronic health record.  This note was generated in part or whole with voice recognition software. Voice recognition is usually quite accurate but there are transcription errors that can and very often do occur. I apologize for any typographical errors that were not detected and corrected.         Ursula Alert, MD 09/14/2020, 12:58 PM

## 2020-09-18 ENCOUNTER — Encounter: Payer: Self-pay | Admitting: Radiology

## 2020-09-18 ENCOUNTER — Other Ambulatory Visit: Payer: Self-pay

## 2020-09-18 ENCOUNTER — Emergency Department: Payer: Medicare Other

## 2020-09-18 ENCOUNTER — Inpatient Hospital Stay
Admission: EM | Admit: 2020-09-18 | Discharge: 2020-09-22 | DRG: 418 | Disposition: A | Discharge status: Home / Self Care | Payer: Medicare Other | Attending: Internal Medicine | Admitting: Internal Medicine

## 2020-09-18 DIAGNOSIS — F341 Dysthymic disorder: Secondary | ICD-10-CM | POA: Diagnosis not present

## 2020-09-18 DIAGNOSIS — N390 Urinary tract infection, site not specified: Secondary | ICD-10-CM | POA: Diagnosis present

## 2020-09-18 DIAGNOSIS — I10 Essential (primary) hypertension: Secondary | ICD-10-CM | POA: Diagnosis present

## 2020-09-18 DIAGNOSIS — Z886 Allergy status to analgesic agent status: Secondary | ICD-10-CM

## 2020-09-18 DIAGNOSIS — K8040 Calculus of bile duct with cholecystitis, unspecified, without obstruction: Secondary | ICD-10-CM | POA: Diagnosis not present

## 2020-09-18 DIAGNOSIS — N3000 Acute cystitis without hematuria: Secondary | ICD-10-CM

## 2020-09-18 DIAGNOSIS — K7689 Other specified diseases of liver: Secondary | ICD-10-CM

## 2020-09-18 DIAGNOSIS — F411 Generalized anxiety disorder: Secondary | ICD-10-CM | POA: Diagnosis present

## 2020-09-18 DIAGNOSIS — Z85828 Personal history of other malignant neoplasm of skin: Secondary | ICD-10-CM

## 2020-09-18 DIAGNOSIS — G8929 Other chronic pain: Secondary | ICD-10-CM | POA: Diagnosis present

## 2020-09-18 DIAGNOSIS — K8050 Calculus of bile duct without cholangitis or cholecystitis without obstruction: Secondary | ICD-10-CM | POA: Diagnosis present

## 2020-09-18 DIAGNOSIS — Z825 Family history of asthma and other chronic lower respiratory diseases: Secondary | ICD-10-CM

## 2020-09-18 DIAGNOSIS — Z888 Allergy status to other drugs, medicaments and biological substances status: Secondary | ICD-10-CM

## 2020-09-18 DIAGNOSIS — Z87891 Personal history of nicotine dependence: Secondary | ICD-10-CM

## 2020-09-18 DIAGNOSIS — R932 Abnormal findings on diagnostic imaging of liver and biliary tract: Secondary | ICD-10-CM | POA: Diagnosis not present

## 2020-09-18 DIAGNOSIS — Z885 Allergy status to narcotic agent status: Secondary | ICD-10-CM | POA: Diagnosis not present

## 2020-09-18 DIAGNOSIS — Z8249 Family history of ischemic heart disease and other diseases of the circulatory system: Secondary | ICD-10-CM

## 2020-09-18 DIAGNOSIS — Z833 Family history of diabetes mellitus: Secondary | ICD-10-CM | POA: Diagnosis not present

## 2020-09-18 DIAGNOSIS — K219 Gastro-esophageal reflux disease without esophagitis: Secondary | ICD-10-CM | POA: Diagnosis present

## 2020-09-18 DIAGNOSIS — R1011 Right upper quadrant pain: Secondary | ICD-10-CM

## 2020-09-18 DIAGNOSIS — E876 Hypokalemia: Secondary | ICD-10-CM | POA: Diagnosis present

## 2020-09-18 DIAGNOSIS — Z79899 Other long term (current) drug therapy: Secondary | ICD-10-CM

## 2020-09-18 DIAGNOSIS — Z803 Family history of malignant neoplasm of breast: Secondary | ICD-10-CM

## 2020-09-18 DIAGNOSIS — Z20822 Contact with and (suspected) exposure to covid-19: Secondary | ICD-10-CM | POA: Diagnosis present

## 2020-09-18 DIAGNOSIS — I493 Ventricular premature depolarization: Secondary | ICD-10-CM | POA: Diagnosis present

## 2020-09-18 DIAGNOSIS — I471 Supraventricular tachycardia: Secondary | ICD-10-CM | POA: Diagnosis present

## 2020-09-18 DIAGNOSIS — E785 Hyperlipidemia, unspecified: Secondary | ICD-10-CM | POA: Diagnosis present

## 2020-09-18 DIAGNOSIS — R000 Tachycardia, unspecified: Secondary | ICD-10-CM | POA: Diagnosis present

## 2020-09-18 DIAGNOSIS — R109 Unspecified abdominal pain: Secondary | ICD-10-CM

## 2020-09-18 DIAGNOSIS — K8021 Calculus of gallbladder without cholecystitis with obstruction: Secondary | ICD-10-CM

## 2020-09-18 DIAGNOSIS — Z87442 Personal history of urinary calculi: Secondary | ICD-10-CM

## 2020-09-18 DIAGNOSIS — K8010 Calculus of gallbladder with chronic cholecystitis without obstruction: Secondary | ICD-10-CM | POA: Diagnosis not present

## 2020-09-18 DIAGNOSIS — F4310 Post-traumatic stress disorder, unspecified: Secondary | ICD-10-CM | POA: Diagnosis present

## 2020-09-18 DIAGNOSIS — Z881 Allergy status to other antibiotic agents status: Secondary | ICD-10-CM

## 2020-09-18 DIAGNOSIS — E0590 Thyrotoxicosis, unspecified without thyrotoxic crisis or storm: Secondary | ICD-10-CM | POA: Diagnosis present

## 2020-09-18 DIAGNOSIS — F32A Depression, unspecified: Secondary | ICD-10-CM | POA: Diagnosis present

## 2020-09-18 DIAGNOSIS — Z90710 Acquired absence of both cervix and uterus: Secondary | ICD-10-CM

## 2020-09-18 DIAGNOSIS — E010 Iodine-deficiency related diffuse (endemic) goiter: Secondary | ICD-10-CM | POA: Diagnosis not present

## 2020-09-18 LAB — COMPREHENSIVE METABOLIC PANEL
ALT: 224 U/L — ABNORMAL HIGH (ref 0–44)
AST: 82 U/L — ABNORMAL HIGH (ref 15–41)
Albumin: 3.8 g/dL (ref 3.5–5.0)
Alkaline Phosphatase: 192 U/L — ABNORMAL HIGH (ref 38–126)
Anion gap: 17 — ABNORMAL HIGH (ref 5–15)
BUN: 6 mg/dL — ABNORMAL LOW (ref 8–23)
CO2: 19 mmol/L — ABNORMAL LOW (ref 22–32)
Calcium: 9.2 mg/dL (ref 8.9–10.3)
Chloride: 99 mmol/L (ref 98–111)
Creatinine, Ser: 0.79 mg/dL (ref 0.44–1.00)
GFR, Estimated: >60 mL/min (ref >60)
Glucose, Bld: 133 mg/dL — ABNORMAL HIGH (ref 70–99)
Potassium: 3.4 mmol/L — ABNORMAL LOW (ref 3.5–5.1)
Sodium: 135 mmol/L (ref 135–145)
Total Bilirubin: 1.6 mg/dL — ABNORMAL HIGH (ref 0.3–1.2)
Total Protein: 8.2 g/dL — ABNORMAL HIGH (ref 6.5–8.1)

## 2020-09-18 LAB — LIPASE, BLOOD: Lipase: 27 U/L (ref 11–51)

## 2020-09-18 LAB — URINALYSIS, COMPLETE (UACMP) WITH MICROSCOPIC
Bilirubin Urine: NEGATIVE
Glucose, UA: NEGATIVE mg/dL
Ketones, ur: 80 mg/dL — AB
Nitrite: NEGATIVE
Protein, ur: NEGATIVE mg/dL
Specific Gravity, Urine: 1.004 — ABNORMAL LOW (ref 1.005–1.030)
WBC, UA: >50 WBC/hpf — ABNORMAL HIGH (ref 0–5)
pH: 6 (ref 5.0–8.0)

## 2020-09-18 LAB — CBC
HCT: 41.1 % (ref 36.0–46.0)
Hemoglobin: 14.4 g/dL (ref 12.0–15.0)
MCH: 31.8 pg (ref 26.0–34.0)
MCHC: 35 g/dL (ref 30.0–36.0)
MCV: 90.7 fL (ref 80.0–100.0)
Platelets: 173 10*3/uL (ref 150–400)
RBC: 4.53 MIL/uL (ref 3.87–5.11)
RDW: 12 % (ref 11.5–15.5)
WBC: 10.3 10*3/uL (ref 4.0–10.5)
nRBC: 0 % (ref 0.0–0.2)

## 2020-09-18 LAB — RESP PANEL BY RT-PCR (FLU A&B, COVID) ARPGX2
Influenza A by PCR: NEGATIVE
Influenza B by PCR: NEGATIVE
SARS Coronavirus 2 by RT PCR: NEGATIVE

## 2020-09-18 MED ORDER — ENOXAPARIN SODIUM 40 MG/0.4ML ~~LOC~~ SOLN
40.0000 mg | SUBCUTANEOUS | Status: DC
  Administered 2020-09-18: 40 mg via SUBCUTANEOUS
  Filled 2020-09-18: qty 0.4

## 2020-09-18 MED ORDER — MORPHINE SULFATE (PF) 2 MG/ML IV SOLN
1.0000 mg | INTRAVENOUS | Status: DC | PRN
  Administered 2020-09-19: 1 mg via INTRAVENOUS
  Filled 2020-09-18: qty 1

## 2020-09-18 MED ORDER — ONDANSETRON HCL 4 MG/2ML IJ SOLN
4.0000 mg | Freq: Once | INTRAMUSCULAR | Status: AC
  Administered 2020-09-18: 4 mg via INTRAVENOUS
  Filled 2020-09-18: qty 2

## 2020-09-18 MED ORDER — ONDANSETRON HCL 4 MG/2ML IJ SOLN: 4.0000 mg | Freq: Four times a day (QID) | INTRAMUSCULAR | Status: DC | PRN

## 2020-09-18 MED ORDER — LACTATED RINGERS IV BOLUS
2000.0000 mL | Freq: Once | INTRAVENOUS | Status: AC
  Administered 2020-09-18: 2000 mL via INTRAVENOUS

## 2020-09-18 MED ORDER — SODIUM CHLORIDE 0.9 % IV SOLN
1.0000 g | INTRAVENOUS | Status: DC
  Administered 2020-09-19 – 2020-09-22 (×4): 1 g via INTRAVENOUS
  Filled 2020-09-18 (×2): qty 10
  Filled 2020-09-18: qty 1
  Filled 2020-09-18: qty 10
  Filled 2020-09-18: qty 1
  Filled 2020-09-18: qty 10

## 2020-09-18 MED ORDER — HYDROMORPHONE HCL 1 MG/ML IJ SOLN
0.5000 mg | INTRAMUSCULAR | Status: DC | PRN
Start: 2020-09-18 — End: 2020-09-22
  Administered 2020-09-20 – 2020-09-21 (×2): 0.5 mg via INTRAVENOUS
  Filled 2020-09-18 (×2): qty 0.5

## 2020-09-18 MED ORDER — IOHEXOL 300 MG/ML  SOLN
100.0000 mL | Freq: Once | INTRAMUSCULAR | Status: AC | PRN
  Administered 2020-09-18: 100 mL via INTRAVENOUS

## 2020-09-18 MED ORDER — SODIUM CHLORIDE 0.9 % IV SOLN
1.0000 g | Freq: Once | INTRAVENOUS | Status: AC
  Administered 2020-09-18: 1 g via INTRAVENOUS
  Filled 2020-09-18: qty 10

## 2020-09-18 MED ORDER — SODIUM CHLORIDE 0.9 % IV BOLUS
1000.0000 mL | Freq: Once | INTRAVENOUS | Status: AC
  Administered 2020-09-18: 1000 mL via INTRAVENOUS

## 2020-09-18 MED ORDER — HYDROMORPHONE HCL 1 MG/ML IJ SOLN
0.5000 mg | Freq: Once | INTRAMUSCULAR | Status: AC
  Administered 2020-09-18: 0.5 mg via INTRAVENOUS
  Filled 2020-09-18: qty 1

## 2020-09-18 MED ORDER — GADOBUTROL 1 MMOL/ML IV SOLN
7.0000 mL | Freq: Once | INTRAVENOUS | Status: AC | PRN
  Administered 2020-09-18: 7 mL via INTRAVENOUS

## 2020-09-18 NOTE — ED Notes (Signed)
MRI called to speak with patient.

## 2020-09-18 NOTE — ED Notes (Signed)
Patient transported to CT 

## 2020-09-18 NOTE — ED Notes (Signed)
Request made for transport to the floor ?

## 2020-09-18 NOTE — ED Notes (Signed)
MD at bedside. Discussing MRI results and transfer to [facility] to retrieve stones in gallbladder. Patient advised she is still in slight discomfort.

## 2020-09-18 NOTE — ED Provider Notes (Signed)
3:45 PM Assumed care for off going team.   Blood pressure 135/77, pulse (!) 115, temperature 98.4 F (36.9 C), temperature source Oral, resp. rate 14, height 5\' 1"  (1.549 m), weight 65.8 kg, last menstrual period 09/30/1991, SpO2 100 %.  See their HPI for full report but in brief MRCP-->cholecystitis, already on ceftriaxone- always baseline tachycardia    Patient already received ceftriaxone for UTI.  If MRCP is negative sensation, antibiotics for UTI with follow-up with surgery outpatient.  If MRCP is positive patient will require transfer.  MRCP does show choledocholithiasis therefore will discuss with patient for transfer.  There is also concern for some cysts on the liver.  At this time there is no signs of fever or white count elevation.  Given patient will need transfer to outside hospital for MRCP I discussed with them that they would like to the CT abdomen pelvis here or there.  D/w GI Dr. Silverio Decamp and they cannot perform ERCP there over the weekend either.  They recommend keeping the patient here due to patient not having signs of cholangitis.  Discussed with Dr. Vicente Males and Dr. Allen Norris is going to be here on Monday so they will keep patient here.  Did add on a CT scan just to further evaluate these liver abscesses.  Will discuss possible team for admission.  Patient requests more pain medications and given some Dilaudid and Zofran.              Vanessa Okeene, MD 09/18/20 [phone]

## 2020-09-18 NOTE — ED Notes (Signed)
Pt ambulatory to toilet without assistance 

## 2020-09-18 NOTE — H&P (Signed)
History and Physical    Wanda [name] QMV:[id number] DOB: Sep 28, 1955 DOA: 09/18/2020  PCP: Barbaraann Boys, MD  Patient coming from: Home  I have personally briefly reviewed patient's old medical records in [place]  Chief Complaint: Abdominal pain  HPI: Wanda [name] is a 65 y.o. female with medical history significant for chronic pain, migraine, paroxysmal SVT, hypertension, hyperlipidemia, anxiety, depression, PTSD, prediabetes and GERD who presents with concerns of abdominal pain.  Starting yesterday she developed acute right upper quadrant abdominal pain that is constant and crampy in nature with associated diarrhea and nausea.  No aggravating or alleviating factors.  It does not correlate with food.  No objective fever but has been diaphoretic.  She also noticed some urinary dribbling/hesitancy. Denies tobacco, alcohol or illicit drug use.  ED Course: She was afebrile, tachycardic and normotensive on room air. CBC unremarkable without leukocytosis. K of 3.4.  AST of 82.  ALT of 224.  Alkaline phosphatase 192.  Total bilirubin 1.6.  UA positive with moderate leukocyte, negative nitrite and greater than 50 WBC.  MRCP showing mild intra and extrahepatic biliary ductal dilatation and choledocholithiasis.  There are also cystic hepatic lesions possibly concerning for hepatic abscess.  ED physician Dr. Jari Pigg discussed case with GI at [facility] but no ERCP done there over the weekend. GI Dr. Vicente Males was consulted and Dr. Allen Norris to take for ERCP in the morning.  Review of Systems: Constitutional: No Weight Change, No Fever ENT/Mouth: No sore throat, No Rhinorrhea Eyes: No Eye Pain, No Vision Changes Cardiovascular: No Chest Pain, no SOB,  No Palpitations Respiratory: No Cough, No Sputum  Gastrointestinal: + Nausea, + Vomiting, + Diarrhea, No Constipation, + Pain Genitourinary: no Urinary Incontinence,+hesitancy Musculoskeletal: No Arthralgias, No Myalgias Skin: No Skin Lesions,  No Pruritus, Neuro: no Weakness, No Numbness Psych: No Anxiety/Panic, No Depression, no decrease appetite Heme/Lymph: No Bruising, No Bleeding  Past Medical History:  Diagnosis Date  . Anxiety   . Basal cell carcinoma 05/03/2016   Left deltoid. Superficial.  . Basal cell carcinoma 05/03/2016   Left distal lat. tricep. Nodular.  . Borderline diabetes   . BRONCHITIS, ACUTE WITH BRONCHOSPASM 06/25/2010   Qualifier: Diagnosis of  By: Alveta Heimlich MD, Cornelia Copa    . Chronic prescription benzodiazepine use 10/18/2015  . Depression   . Dysrhythmia    tachycardia  . GASTROENTERITIS WITHOUT DEHYDRATION 05/14/2009   Qualifier: Diagnosis of  By: Deborra Medina MD, Tanja Port    . GERD (gastroesophageal reflux disease)   . Herpes   . 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 History of kidney stones   . Hyperlipidemia   . Hypertension   . Irregular heart beat   . Pre-diabetes   . Sinus tachycardia 04/11/2017  . Skin cancer of face 08/01/2018  . 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 Dossie Arbour Serial number: [id number] Model number: [id number]     Past Surgical History:  Procedure Laterality Date  . ABDOMINAL HYSTERECTOMY    . APPENDECTOMY    . BACK SURGERY     X 3  . BREAST EXCISIONAL BIOPSY Right    surgical bx age 89   . COLONOSCOPY N/A 08/28/2013   Procedure: COLONOSCOPY;  Surgeon: Danie Binder, MD;  Location: [facility];  Service: Endoscopy;  Laterality: N/A;  8:30 AM  . INTRAMEDULLARY (IM) NAIL INTERTROCHANTERIC Left 05/30/2016   Procedure: INTRAMEDULLARY (IM) NAIL INTERTROCHANTRIC;  Surgeon: Lennette Bihari  Mack Guise, MD;  Location: [facility] ORS;  Service: Orthopedics;  Laterality: Left;  . SHOULDER SURGERY Right   . Spinal cord stimulator    . SPINAL CORD STIMULATOR REMOVAL N/A 01/12/2020   Procedure: REMOVAL SPINAL CORD STIMULATOR AND PULSE GENERATOR;  Surgeon: Deetta Perla, MD;  Location: [facility] ORS;  Service:  Neurosurgery;  Laterality: N/A;  Local w/ MAC     reports that she quit smoking about 30 years ago. Her smoking use included cigarettes. She has a 3.50 pack-year smoking history. She has never used smokeless tobacco. She reports current alcohol use. She reports that she does not use drugs. Social History  Allergies  Allergen Reactions  . Levofloxacin Hives and Nausea And Vomiting  . Tramadol Other (See Comments)    Severe headache  . Codeine Nausea Only    Slight nausea  . Mobic [Meloxicam] Other (See Comments)    Calf tightness  . Propoxyphene Hcl Rash    Family History  Problem Relation Age of Onset  . Diabetes Mother   . COPD Mother   . Heart disease Mother   . Diabetes Father   . COPD Father   . Heart disease Father   . Heart attack Father 40       CABG  . Alcohol abuse Daughter   . Depression Daughter   . Drug abuse Daughter   . Anxiety disorder Daughter   . Tuberculosis Maternal Aunt   . Cancer Paternal Aunt        breast  . Breast cancer Paternal Aunt   . Diabetes Paternal Grandmother   . Diabetes Sister   . Cancer Cousin   . Diabetes Cousin   . Colon cancer Neg Hx   . Sudden Cardiac Death Neg Hx      Prior to Admission medications   Medication Sig Start Date End Date Taking? Authorizing Provider  atorvastatin (LIPITOR) 40 MG tablet Take 40 mg by mouth daily. PM    [provider]  cetirizine (ZYRTEC) 10 MG tablet Take 10 mg by mouth daily.  10/01/18 07/30/30  [provider]  Cholecalciferol (VITAMIN D3) 5000 units TABS Take 5,000 Units by mouth daily.    [provider]  dexlansoprazole (DEXILANT) 60 MG capsule Take 60 mg by mouth daily.  03/15/18 07/30/30  [provider]  famotidine (PEPCID) 40 MG tablet Take 40 mg by mouth daily.     [provider]  hydrOXYzine (VISTARIL) 25 MG capsule TAKE 1 CAPSULE BY MOUTH THREE TIMES DAILY AS NEEDED FOR SEVERE ANXIETY 08/25/20   Ursula Alert, MD  LORazepam (ATIVAN) 1 MG  tablet Take 0.5 tablets (0.5 mg total) by mouth as directed. Take half tablet ( 0.5 mg) daily once as needed for severe anxiety attacks only. 08/20/20   Ursula Alert, MD  metoprolol succinate (TOPROL-XL) 25 MG 24 hr tablet TAKE 1 TABLET(25 MG) BY MOUTH DAILY 07/13/20   End, Harrell Gave, MD  Multiple Vitamin (MULTIVITAMIN) tablet Take 1 tablet by mouth daily.    [provider]  Naloxone HCl 0.4 MG/0.4ML SOAJ Take if having symptoms of opioid overdose including extreme sleepiness or stopping breathing 01/12/20   Zdeb, Christine, NP  potassium chloride (KLOR-CON) 20 MEQ packet Take 20 mEq by mouth 2 (two) times daily.    [provider]  Vilazodone HCl (VIIBRYD) 40 MG TABS DAILY 04/16/20   Ursula Alert, MD  zaleplon (SONATA) 5 MG capsule Take 1 capsule (5 mg total) by mouth at bedtime as needed for sleep. 09/13/20  Ursula Alert, MD    Physical Exam: Vitals:   09/18/20 1800 09/18/20 1830 09/18/20 1900 09/18/20 2000  BP: 121/74 (!) 129/92 121/85 114/71  Pulse: (!) 126 (!) 129 (!) 110 (!) 109  Resp: _0 Temp:      TempSrc:      SpO2: 100% 99% 98% 98%  Weight:      Height:        Constitutional: NAD, calm, comfortable, elderly female sitting upright in bed Vitals:   09/18/20 1800 09/18/20 1830 09/18/20 1900 09/18/20 2000  BP: 121/74 (!) 129/92 121/85 114/71  Pulse: (!) 126 (!) 129 (!) 110 (!) 109  Resp: _1 Temp:      TempSrc:      SpO2: 100% 99% 98% 98%  Weight:      Height:       Eyes: PERRL, lids and conjunctivae normal ENMT: Mucous membranes are moist.  Neck: normal, supple Respiratory: clear to auscultation bilaterally, no wheezing, no crackles. Normal respiratory effort. No accessory muscle use.  Cardiovascular: Sinus tachycardia on telemetry, no murmurs / rubs / gallops. No extremity edema.  Abdomen: Moderate right upper quadrant pain, no masses palpated.Bowel sounds positive.  Musculoskeletal: no clubbing / cyanosis. No joint  deformity upper and lower extremities. Good ROM, no contractures. Normal muscle tone.  Skin: no rashes, lesions, ulcers. No induration Neurologic: CN 2-12 grossly intact. Sensation intact,Strength 5/5 in all 4.  Psychiatric: Normal judgment and insight. Alert and oriented x 3. Normal mood.     Labs on Admission: I have personally reviewed following labs and imaging studies  CBC: Recent Labs  Lab 09/18/20 1114  WBC 10.3  HGB 14.4  HCT 41.1  MCV 90.7  PLT 161   Basic Metabolic Panel: Recent Labs  Lab 09/18/20 1114  NA 135  K 3.4*  CL 99  CO2 19*  GLUCOSE 133*  BUN 6*  CREATININE 0.79  CALCIUM 9.2   GFR: Estimated Creatinine Clearance: 61.7 mL/min (by C-G formula based on SCr of 0.79 mg/dL). Liver Function Tests: Recent Labs  Lab 09/18/20 1114  AST 82*  ALT 224*  ALKPHOS 192*  BILITOT 1.6*  PROT 8.2*  ALBUMIN 3.8   Recent Labs  Lab 09/18/20 1114  LIPASE 27   No results for input(s): AMMONIA in the last 168 hours. Coagulation Profile: No results for input(s): INR, PROTIME in the last 168 hours. Cardiac Enzymes: No results for input(s): CKTOTAL, CKMB, CKMBINDEX, TROPONINI in the last 168 hours. BNP (last 3 results) No results for input(s): PROBNP in the last 8760 hours. HbA1C: No results for input(s): HGBA1C in the last 72 hours. CBG: No results for input(s): GLUCAP in the last 168 hours. Lipid Profile: No results for input(s): CHOL, HDL, LDLCALC, TRIG, CHOLHDL, LDLDIRECT in the last 72 hours. Thyroid Function Tests: No results for input(s): TSH, T4TOTAL, FREET4, T3FREE, THYROIDAB in the last 72 hours. Anemia Panel: No results for input(s): VITAMINB12, FOLATE, FERRITIN, TIBC, IRON, RETICCTPCT in the last 72 hours. Urine analysis:    Component Value Date/Time   COLORURINE YELLOW (A) 09/18/2020 1114   APPEARANCEUR HAZY (A) 09/18/2020 1114   LABSPEC 1.004 (L) 09/18/2020 1114   PHURINE 6.0 09/18/2020 1114   GLUCOSEU NEGATIVE 09/18/2020 1114   HGBUR  SMALL (A) 09/18/2020 1114   BILIRUBINUR NEGATIVE 09/18/2020 1114   KETONESUR 80 (A) 09/18/2020 1114   PROTEINUR NEGATIVE 09/18/2020 1114   NITRITE NEGATIVE 09/18/2020 1114   LEUKOCYTESUR MODERATE (A) 09/18/2020 1114  Radiological Exams on Admission: CT ABDOMEN PELVIS W CONTRAST  Result Date: 09/18/2020 CLINICAL DATA:  Abdominal distension, choledochal lithiasis seen on prior MRCP. EXAM: CT ABDOMEN AND PELVIS WITH CONTRAST TECHNIQUE: Multidetector CT imaging of the abdomen and pelvis was performed using the standard protocol following bolus administration of intravenous contrast. CONTRAST:  135m OMNIPAQUE IOHEXOL 300 MG/ML  SOLN COMPARISON:  MRCP dated 09/18/2020. FINDINGS: Lower chest: No acute abnormality. Hepatobiliary: A hypoattenuating lesion in the left hepatic lobe measures 1.7 cm and demonstrated internal debris and rim enhancement on same day MRCP (series 2, image 20). A 9 mm benign cyst is seen in the right hepatic lobe (series 2, image 22). A few additional lesions identified on MRCP are less well seen on this exam. No gallbladder wall thickening. The common bile duct is mildly dilated, measuring up to 6 mm. The patient's known cholelithiasis and choledocholithiasis is not well seen on this exam. Pancreas: Unremarkable. No pancreatic ductal dilatation or surrounding inflammatory changes. Spleen: A 4 mm hypoattenuating lesion in the spleen is consistent with a benign cyst. Adrenals/Urinary Tract: Adrenal glands are unremarkable. Bilateral renal cysts measure up to 7 mm. Nonobstructive renal calculi measure 2 mm, one in each kidney. No hydronephrosis. Bladder is unremarkable. Stomach/Bowel: Stomach is within normal limits. No pericecal inflammatory changes are noted to suggest acute appendicitis. No evidence of bowel wall thickening, distention, or inflammatory changes. Vascular/Lymphatic: No significant vascular findings are present. No enlarged abdominal or pelvic lymph nodes. Reproductive:  Status post hysterectomy. No adnexal masses. Other: No abdominal wall hernia or abnormality. No abdominopelvic ascites. Musculoskeletal: Degenerative changes are seen in the spine. The patient is status post internal fixation of the proximal left femur. There is a chronic appearing compression deformity of L1. IMPRESSION: 1. Known cholelithiasis and choledocholithiasis better characterized on same day MRCP. The common bile duct is mildly dilated, measuring up to 6 mm. 2. Multiple hepatic lesions are better characterized on same day MRCP. Aortic Atherosclerosis (ICD10-I70.0). Electronically Signed   By: TZerita BoersM.D.   On: 09/18/2020 18:42   MR 3D Recon At Scanner  Result Date: 09/18/2020 CLINICAL DATA:  Nausea vomiting and diarrhea with laboratory signs of biliary obstruction. EXAM: MRI ABDOMEN WITHOUT AND WITH CONTRAST (INCLUDING MRCP) TECHNIQUE: Multiplanar multisequence MR imaging of the abdomen was performed both before and after the administration of intravenous contrast. Heavily T2-weighted images of the biliary and pancreatic ducts were obtained, and three-dimensional MRCP images were rendered by post processing. CONTRAST:  765mGADAVIST GADOBUTROL 1 MMOL/ML IV SOLN COMPARISON:  Same day ultrasound. FINDINGS: Lower chest: No acute findings. Hepatobiliary: No hepatic steatosis. There are at least 3 cystic rim enhancing lesions in the liver the largest of which is in the anterior left lobe of the liver measuring 1.7 cm on image 36/17 with that cysts demonstrating some internal debris, a smaller rim enhancing cystic lesion is visualized in the right lobe of the liver measuring 6 mm on image 34/22 with a similar appearing smaller 3 mm lesion in the lateral left lobe of the liver on image 33/22. There is a wedge-shaped area of increased arterial enhancement along the falciform ligament which surrounds the larger lesion as well as some increased T2 signal in this area suggestive of hepatic edema. There are  2 nonenhancing simple cyst in the right lobe of the liver the largest of which measures 9 mm. Cholelithiasis measuring up to 5 mm without pericholecystic fluid or gallbladder wall thickening. There is mild intra and extrahepatic biliary ductal dilation  with the common duct measuring 7 mm on image 10/10. There are 2 small choledocholithiasis in the distal common duct measuring up to 4 mm on image 16/3. There is no peribiliary enhancement. Pancreas: No mass, inflammatory changes, or other parenchymal abnormality identified. Spleen: There is a nonenhancing 4 mm cystic lesion in the spleen, likely a benign cyst or lymphangioma. Adrenals/Urinary Tract: Bilateral adrenal glands are unremarkable. No hydronephrosis. Small bilateral cysts measuring up to 7 mm in the left kidney. No solid enhancing renal lesions. Stomach/Bowel: Visualized portions within the abdomen are unremarkable. Vascular/Lymphatic: Aortic atherosclerosis without aneurysmal dilation. Other:  None. Musculoskeletal: No suspicious bone lesions identified. IMPRESSION: 1. There is mild intra and extrahepatic biliary ductal dilation with 2 small choledocholithiasis in the distal common duct measuring up to 4 mm. No peribiliary enhancement. 2. Rim enhancing cystic hepatic lesions measuring up to 1.7 cm as well as 2 smaller subcentimeter lesions, with this larger lesion demonstrating some internal complexity with associated hepatic edema, findings which are nonspecific but suspicious for hepatic abscess/micro abscesses within alternate diagnosis including biliary cystadenoma. Recommend correlation with laboratory values for signs of infection. There are no MRI findings to suggest ascending cholangitis, as such recommend CT abdomen and pelvis to assess for a non biliary source of infection if continued clinical concern for infection. 3. Cholelithiasis without evidence of acute cholecystitis. These results were called by telephone at the time of interpretation on  09/18/2020 at 5:19 pm to provider Dr. Nickolas Madrid, who verbally acknowledged these results. Electronically Signed   By: Dahlia Bailiff MD   On: 09/18/2020 17:21   MR ABDOMEN MRCP W WO CONTAST  Result Date: 09/18/2020 CLINICAL DATA:  Nausea vomiting and diarrhea with laboratory signs of biliary obstruction. EXAM: MRI ABDOMEN WITHOUT AND WITH CONTRAST (INCLUDING MRCP) TECHNIQUE: Multiplanar multisequence MR imaging of the abdomen was performed both before and after the administration of intravenous contrast. Heavily T2-weighted images of the biliary and pancreatic ducts were obtained, and three-dimensional MRCP images were rendered by post processing. CONTRAST:  4m GADAVIST GADOBUTROL 1 MMOL/ML IV SOLN COMPARISON:  Same day ultrasound. FINDINGS: Lower chest: No acute findings. Hepatobiliary: No hepatic steatosis. There are at least 3 cystic rim enhancing lesions in the liver the largest of which is in the anterior left lobe of the liver measuring 1.7 cm on image 36/17 with that cysts demonstrating some internal debris, a smaller rim enhancing cystic lesion is visualized in the right lobe of the liver measuring 6 mm on image 34/22 with a similar appearing smaller 3 mm lesion in the lateral left lobe of the liver on image 33/22. There is a wedge-shaped area of increased arterial enhancement along the falciform ligament which surrounds the larger lesion as well as some increased T2 signal in this area suggestive of hepatic edema. There are 2 nonenhancing simple cyst in the right lobe of the liver the largest of which measures 9 mm. Cholelithiasis measuring up to 5 mm without pericholecystic fluid or gallbladder wall thickening. There is mild intra and extrahepatic biliary ductal dilation with the common duct measuring 7 mm on image 10/10. There are 2 small choledocholithiasis in the distal common duct measuring up to 4 mm on image 16/3. There is no peribiliary enhancement. Pancreas: No mass, inflammatory changes, or other  parenchymal abnormality identified. Spleen: There is a nonenhancing 4 mm cystic lesion in the spleen, likely a benign cyst or lymphangioma. Adrenals/Urinary Tract: Bilateral adrenal glands are unremarkable. No hydronephrosis. Small bilateral cysts measuring up to 7 mm in  the left kidney. No solid enhancing renal lesions. Stomach/Bowel: Visualized portions within the abdomen are unremarkable. Vascular/Lymphatic: Aortic atherosclerosis without aneurysmal dilation. Other:  None. Musculoskeletal: No suspicious bone lesions identified. IMPRESSION: 1. There is mild intra and extrahepatic biliary ductal dilation with 2 small choledocholithiasis in the distal common duct measuring up to 4 mm. No peribiliary enhancement. 2. Rim enhancing cystic hepatic lesions measuring up to 1.7 cm as well as 2 smaller subcentimeter lesions, with this larger lesion demonstrating some internal complexity with associated hepatic edema, findings which are nonspecific but suspicious for hepatic abscess/micro abscesses within alternate diagnosis including biliary cystadenoma. Recommend correlation with laboratory values for signs of infection. There are no MRI findings to suggest ascending cholangitis, as such recommend CT abdomen and pelvis to assess for a non biliary source of infection if continued clinical concern for infection. 3. Cholelithiasis without evidence of acute cholecystitis. These results were called by telephone at the time of interpretation on 09/18/2020 at 5:19 pm to provider Dr. Nickolas Madrid, who verbally acknowledged these results. Electronically Signed   By: Dahlia Bailiff MD   On: 09/18/2020 17:21   US ABDOMEN LIMITED RUQ (LIVER/GB)  Result Date: 09/18/2020 CLINICAL DATA:  Right upper quadrant pain for 2 days, elevated liver function tests. EXAM: ULTRASOUND ABDOMEN LIMITED RIGHT UPPER QUADRANT COMPARISON:  None. FINDINGS: Gallbladder: A mobile 1.1 cm gallstone is noted. No wall thickening visualized. No sonographic Murphy sign  noted by sonographer. Common bile duct: Diameter: 3 mm Liver: A 1 cm cyst is seen in the right hepatic lobe. Within normal limits in parenchymal echogenicity. Portal vein is patent on color Doppler imaging with normal direction of blood flow towards the liver. Other: None. IMPRESSION: Cholelithiasis without evidence of acute cholecystitis. Electronically Signed   By: Zerita Boers M.D.   On: 09/18/2020 12:56      Assessment/Plan  Choledocholithiasis w/out cholangitis  - w/ transaminitis and mildly elevated T bili - GI Dr. Vicente Males has been consulted  -ERCP on Monday with Dr Allen Norris  - GI recommends consult surgery for timing of Cholecystectomy. Recommends cardiology consult  - Can have a soft diet today and tomorrow.  N.p.o. from tomorrow 4/25 midnight  UTI -Continue IV Rocephin -Urine culture pending  Hepatic cyst concerning for abscess on MRCP- repeat CT could not delinerate further pt is afebrile and has no leukocytosis- low suspicion for abscess at this time but low threshold to add on anerobic antibiotic coverage if she becomes febrile or has any worsening clinical changes  Tachycardia  - Has been following with cardiology outpatient - had recent cardiac monitoring in March with predominant sinus him and with rare PACs and occasional PVCs, paroxysmal SVT - probably more stress and anxiety related as pt recently loss her daughter   hypokalemia  replete and continue home supplement check Mg level  HTN continue metoprolol   GERD continue PPI and famotidine  anxiety/depression  Continue home medications   DVT prophylaxis:.Lovenox Code Status: Full Family Communication: Plan discussed with patient at bedside  disposition Plan: Home with at least 2 midnight stays  Consults called: GI Admission status: inpatient   Level of care: Progressive Cardiac  Status is: Inpatient  Remains inpatient appropriate because:Inpatient level of care appropriate due to severity of  illness   Dispo: The patient is from: Home              Anticipated d/c is to: Home              Patient currently is  not medically stable to d/c.   Difficult to place patient No         Orene Desanctis DO [facility]   If 7PM-7AM, please contact night-coverage [email or web address]   09/18/2020, 8:26 PM

## 2020-09-18 NOTE — Consult Note (Addendum)
Jonathon Bellows , MD [address] [place], [place], [place], [address] [id number] [place], [place], [place], [address] Phone: [phone]  Fax: [phone]  Consultation  Referring Provider:     ER Primary Care Physician:  Barbaraann Boys, MD Primary Gastroenterologist:  Dr. Alice Reichert         Reason for Consultation:     Choledocholithiasis  Date of Admission:  09/18/2020 Date of Consultation:  09/18/2020         HPI:   Wanda [name] is a 65 y.o. female presented to the ER earlier today with nausea, vomiting, right upper quadrant pain and diarrhea.  She states the symptoms began a couple of days back.  Got worse and hence came to the hospital.  She is unsure if she had any fevers, presently has no vomiting, no diarrhea has vague upper right side abdominal discomfort.  No other complaints.  Not on any blood thinners.  She says she has palpitations which she is being evaluated for.   Noted to have elevated LFT's as below , previously normal 2 months back. Hb 14.4 and WCC 10.3, Lipase 27, RUQ USG Cholelithiasis without evidence of acute cholecystitis.CBD 3 mm .   I was called and suggested MRCP :  There is mild intra and extrahepatic biliary ductal dilation with 2 small choledocholithiasis in the distal common duct measuring up to 4 mm.   2. Rim enhancing cystic hepatic lesions measuring up to 1.7 cm as well as 2 smaller subcentimeter lesions, with this larger lesion demonstrating some internal complexity with associated hepatic edema, findings which are nonspecific but suspicious for hepatic abscess/micro abscesses within alternate diagnosis including biliary cystadenoma. Recommend correlation with laboratory values for signs of infection. There are no MRI findings to suggest ascending cholangitis, as such recommend CT abdomen and pelvis to assess for anon biliary source of infection if continued clinical concern for Infection.   CT abdomen followed : results awaited.     Hepatic Function Latest Ref Rng & Units 09/18/2020 07/14/2020 07/08/2018  Total Protein 6.5 - 8.1 g/dL 8.2(H) 7.2 7.1  Albumin 3.5 - 5.0 g/dL 3.8 4.3 4.3  AST 15 - 41 U/L 82(H) 26 31  ALT 0 - 44 U/L 224(H) 18 24  Alk Phosphatase 38 - 126 U/L 192(H) 82 82  Total Bilirubin 0.3 - 1.2 mg/dL 1.6(H) 0.8 0.5  Bilirubin, Direct 0.00 - 0.40 mg/dL - - -      Past Medical History:  Diagnosis Date  . Anxiety   . Basal cell carcinoma 05/03/2016   Left deltoid. Superficial.  . Basal cell carcinoma 05/03/2016   Left distal lat. tricep. Nodular.  . Borderline diabetes   . BRONCHITIS, ACUTE WITH BRONCHOSPASM 06/25/2010   Qualifier: Diagnosis of  By: Alveta Heimlich MD, Cornelia Copa    . Chronic prescription benzodiazepine use 10/18/2015  . Depression   . Dysrhythmia    tachycardia  . GASTROENTERITIS WITHOUT DEHYDRATION 05/14/2009   Qualifier: Diagnosis of  By: Deborra Medina MD, Tanja Port    . GERD (gastroesophageal reflux disease)   . Herpes   . 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 History of kidney stones   . Hyperlipidemia   . Hypertension   . Irregular heart beat   . Pre-diabetes   . Sinus tachycardia 04/11/2017  . Skin cancer of face 08/01/2018  . 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 Dossie Arbour Serial number: [id number] Model number:  [id number]     Past Surgical History:  Procedure Laterality Date  . ABDOMINAL HYSTERECTOMY    . APPENDECTOMY    . BACK SURGERY     X 3  . BREAST EXCISIONAL BIOPSY Right    surgical bx age 37   . COLONOSCOPY N/A 08/28/2013   Procedure: COLONOSCOPY;  Surgeon: Danie Binder, MD;  Location: [facility];  Service: Endoscopy;  Laterality: N/A;  8:30 AM  . INTRAMEDULLARY (IM) NAIL INTERTROCHANTERIC Left 05/30/2016   Procedure: INTRAMEDULLARY (IM) NAIL INTERTROCHANTRIC;  Surgeon: Thornton Park, MD;  Location: [facility];  Service: Orthopedics;  Laterality: Left;  . SHOULDER  SURGERY Right   . Spinal cord stimulator    . SPINAL CORD STIMULATOR REMOVAL N/A 01/12/2020   Procedure: REMOVAL SPINAL CORD STIMULATOR AND PULSE GENERATOR;  Surgeon: Deetta Perla, MD;  Location: [facility];  Service: Neurosurgery;  Laterality: N/A;  Local w/ MAC    Prior to Admission medications   Medication Sig Start Date End Date Taking? Authorizing Provider  atorvastatin (LIPITOR) 40 MG tablet Take 40 mg by mouth daily. PM    [provider]  cetirizine (ZYRTEC) 10 MG tablet Take 10 mg by mouth daily.  10/01/18 07/30/30  [provider]  Cholecalciferol (VITAMIN D3) 5000 units TABS Take 5,000 Units by mouth daily.    [provider]  dexlansoprazole (DEXILANT) 60 MG capsule Take 60 mg by mouth daily.  03/15/18 07/30/30  [provider]  famotidine (PEPCID) 40 MG tablet Take 40 mg by mouth daily.     [provider]  hydrOXYzine (VISTARIL) 25 MG capsule TAKE 1 CAPSULE BY MOUTH THREE TIMES DAILY AS NEEDED FOR SEVERE ANXIETY 08/25/20   Ursula Alert, MD  LORazepam (ATIVAN) 1 MG tablet Take 0.5 tablets (0.5 mg total) by mouth as directed. Take half tablet ( 0.5 mg) daily once as needed for severe anxiety attacks only. 08/20/20   Ursula Alert, MD  metoprolol succinate (TOPROL-XL) 25 MG 24 hr tablet TAKE 1 TABLET(25 MG) BY MOUTH DAILY 07/13/20   End, Harrell Gave, MD  Multiple Vitamin (MULTIVITAMIN) tablet Take 1 tablet by mouth daily.    [provider]  Naloxone HCl 0.4 MG/0.4ML SOAJ Take if having symptoms of opioid overdose including extreme sleepiness or stopping breathing 01/12/20   Zdeb, Christine, NP  potassium chloride (KLOR-CON) 20 MEQ packet Take 20 mEq by mouth 2 (two) times daily.    [provider]  Vilazodone HCl (VIIBRYD) 40 MG TABS DAILY 04/16/20   Ursula Alert, MD  zaleplon (SONATA) 5 MG capsule Take 1 capsule (5 mg total) by mouth at bedtime as needed for sleep. 09/13/20   Ursula Alert, MD    Family History  Problem  Relation Age of Onset  . Diabetes Mother   . COPD Mother   . Heart disease Mother   . Diabetes Father   . COPD Father   . Heart disease Father   . Heart attack Father 28       CABG  . Alcohol abuse Daughter   . Depression Daughter   . Drug abuse Daughter   . Anxiety disorder Daughter   . Tuberculosis Maternal Aunt   . Cancer Paternal Aunt        breast  . Breast cancer Paternal Aunt   . Diabetes Paternal Grandmother   . Diabetes Sister   . Cancer Cousin   . Diabetes Cousin   . Colon cancer Neg Hx   . Sudden Cardiac Death Neg Hx  Social History   Tobacco Use  . Smoking status: Former Smoker    Packs/day: 0.25    Years: 14.00    Pack years: 3.50    Types: Cigarettes    Quit date: 1992    Years since quitting: 30.3  . Smokeless tobacco: Never Used  Vaping Use  . Vaping Use: Never used  Substance Use Topics  . Alcohol use: Yes    Comment: occasional; once every 6 months  . Drug use: No    Allergies as of 09/18/2020 - Review Complete 09/18/2020  Allergen Reaction Noted  . Levofloxacin Hives and Nausea And Vomiting   . Tramadol Other (See Comments) 08/09/2016  . Codeine Nausea Only   . Mobic [meloxicam] Other (See Comments) 08/09/2016  . Propoxyphene hcl Rash     Review of Systems:    All systems reviewed and negative except where noted in HPI.   Physical Exam:  Vital signs in last 24 hours: Temp:  [98.4 F (36.9 C)] 98.4 F (36.9 C) (04/23 1105) Pulse Rate:  [109-160] 109 (04/23 2000) Resp:  [10-27] 19 (04/23 2000) BP: (93-160)/(70-132) 114/71 (04/23 2000) SpO2:  [97 %-100 %] 98 % (04/23 2000) Weight:  [65.8 kg] 65.8 kg (04/23 1107)   General:   Pleasant, cooperative in NAD Head:  Normocephalic and atraumatic. Eyes:   No icterus.   Conjunctiva pink. PERRLA. Ears:  Normal auditory acuity. Neck:  Supple; no masses or thyroidomegaly Lungs: Respirations even and unlabored. Lungs clear to auscultation bilaterally.   No wheezes, crackles, or rhonchi.   Heart:  Regular rate and rhythm;  Without murmur, clicks, rubs or gallops Abdomen:  Soft, nondistended, nontender. Normal bowel sounds. No appreciable masses or hepatomegaly.  No rebound or guarding.  Neurologic:  Alert and oriented x3;  grossly normal neurologically. Psych:  Alert and cooperative. Normal affect.  LAB RESULTS: Recent Labs    09/18/20 1114  WBC 10.3  HGB 14.4  HCT 41.1  PLT 173   BMET Recent Labs    09/18/20 1114  NA 135  K 3.4*  CL 99  CO2 19*  GLUCOSE 133*  BUN 6*  CREATININE 0.79  CALCIUM 9.2   LFT Recent Labs    09/18/20 1114  PROT 8.2*  ALBUMIN 3.8  AST 82*  ALT 224*  ALKPHOS 192*  BILITOT 1.6*   PT/INR No results for input(s): LABPROT, INR in the last 72 hours.  STUDIES: CT ABDOMEN PELVIS W CONTRAST  Result Date: 09/18/2020 CLINICAL DATA:  Abdominal distension, choledochal lithiasis seen on prior MRCP. EXAM: CT ABDOMEN AND PELVIS WITH CONTRAST TECHNIQUE: Multidetector CT imaging of the abdomen and pelvis was performed using the standard protocol following bolus administration of intravenous contrast. CONTRAST:  161m OMNIPAQUE IOHEXOL 300 MG/ML  SOLN COMPARISON:  MRCP dated 09/18/2020. FINDINGS: Lower chest: No acute abnormality. Hepatobiliary: A hypoattenuating lesion in the left hepatic lobe measures 1.7 cm and demonstrated internal debris and rim enhancement on same day MRCP (series 2, image 20). A 9 mm benign cyst is seen in the right hepatic lobe (series 2, image 22). A few additional lesions identified on MRCP are less well seen on this exam. No gallbladder wall thickening. The common bile duct is mildly dilated, measuring up to 6 mm. The patient's known cholelithiasis and choledocholithiasis is not well seen on this exam. Pancreas: Unremarkable. No pancreatic ductal dilatation or surrounding inflammatory changes. Spleen: A 4 mm hypoattenuating lesion in the spleen is consistent with a benign cyst. Adrenals/Urinary Tract: Adrenal glands are  unremarkable. Bilateral renal cysts measure up to 7 mm. Nonobstructive renal calculi measure 2 mm, one in each kidney. No hydronephrosis. Bladder is unremarkable. Stomach/Bowel: Stomach is within normal limits. No pericecal inflammatory changes are noted to suggest acute appendicitis. No evidence of bowel wall thickening, distention, or inflammatory changes. Vascular/Lymphatic: No significant vascular findings are present. No enlarged abdominal or pelvic lymph nodes. Reproductive: Status post hysterectomy. No adnexal masses. Other: No abdominal wall hernia or abnormality. No abdominopelvic ascites. Musculoskeletal: Degenerative changes are seen in the spine. The patient is status post internal fixation of the proximal left femur. There is a chronic appearing compression deformity of L1. IMPRESSION: 1. Known cholelithiasis and choledocholithiasis better characterized on same day MRCP. The common bile duct is mildly dilated, measuring up to 6 mm. 2. Multiple hepatic lesions are better characterized on same day MRCP. Aortic Atherosclerosis (ICD10-I70.0). Electronically Signed   By: Zerita Boers M.D.   On: 09/18/2020 18:42   MR 3D Recon At Scanner  Result Date: 09/18/2020 CLINICAL DATA:  Nausea vomiting and diarrhea with laboratory signs of biliary obstruction. EXAM: MRI ABDOMEN WITHOUT AND WITH CONTRAST (INCLUDING MRCP) TECHNIQUE: Multiplanar multisequence MR imaging of the abdomen was performed both before and after the administration of intravenous contrast. Heavily T2-weighted images of the biliary and pancreatic ducts were obtained, and three-dimensional MRCP images were rendered by post processing. CONTRAST:  89m GADAVIST GADOBUTROL 1 MMOL/ML IV SOLN COMPARISON:  Same day ultrasound. FINDINGS: Lower chest: No acute findings. Hepatobiliary: No hepatic steatosis. There are at least 3 cystic rim enhancing lesions in the liver the largest of which is in the anterior left lobe of the liver measuring 1.7 cm on  image 36/17 with that cysts demonstrating some internal debris, a smaller rim enhancing cystic lesion is visualized in the right lobe of the liver measuring 6 mm on image 34/22 with a similar appearing smaller 3 mm lesion in the lateral left lobe of the liver on image 33/22. There is a wedge-shaped area of increased arterial enhancement along the falciform ligament which surrounds the larger lesion as well as some increased T2 signal in this area suggestive of hepatic edema. There are 2 nonenhancing simple cyst in the right lobe of the liver the largest of which measures 9 mm. Cholelithiasis measuring up to 5 mm without pericholecystic fluid or gallbladder wall thickening. There is mild intra and extrahepatic biliary ductal dilation with the common duct measuring 7 mm on image 10/10. There are 2 small choledocholithiasis in the distal common duct measuring up to 4 mm on image 16/3. There is no peribiliary enhancement. Pancreas: No mass, inflammatory changes, or other parenchymal abnormality identified. Spleen: There is a nonenhancing 4 mm cystic lesion in the spleen, likely a benign cyst or lymphangioma. Adrenals/Urinary Tract: Bilateral adrenal glands are unremarkable. No hydronephrosis. Small bilateral cysts measuring up to 7 mm in the left kidney. No solid enhancing renal lesions. Stomach/Bowel: Visualized portions within the abdomen are unremarkable. Vascular/Lymphatic: Aortic atherosclerosis without aneurysmal dilation. Other:  None. Musculoskeletal: No suspicious bone lesions identified. IMPRESSION: 1. There is mild intra and extrahepatic biliary ductal dilation with 2 small choledocholithiasis in the distal common duct measuring up to 4 mm. No peribiliary enhancement. 2. Rim enhancing cystic hepatic lesions measuring up to 1.7 cm as well as 2 smaller subcentimeter lesions, with this larger lesion demonstrating some internal complexity with associated hepatic edema, findings which are nonspecific but  suspicious for hepatic abscess/micro abscesses within alternate diagnosis including biliary cystadenoma. Recommend correlation with laboratory  values for signs of infection. There are no MRI findings to suggest ascending cholangitis, as such recommend CT abdomen and pelvis to assess for a non biliary source of infection if continued clinical concern for infection. 3. Cholelithiasis without evidence of acute cholecystitis. These results were called by telephone at the time of interpretation on 09/18/2020 at 5:19 pm to provider Dr. Nickolas Madrid, who verbally acknowledged these results. Electronically Signed   By: Dahlia Bailiff MD   On: 09/18/2020 17:21   MR ABDOMEN MRCP W WO CONTAST  Result Date: 09/18/2020 CLINICAL DATA:  Nausea vomiting and diarrhea with laboratory signs of biliary obstruction. EXAM: MRI ABDOMEN WITHOUT AND WITH CONTRAST (INCLUDING MRCP) TECHNIQUE: Multiplanar multisequence MR imaging of the abdomen was performed both before and after the administration of intravenous contrast. Heavily T2-weighted images of the biliary and pancreatic ducts were obtained, and three-dimensional MRCP images were rendered by post processing. CONTRAST:  41m GADAVIST GADOBUTROL 1 MMOL/ML IV SOLN COMPARISON:  Same day ultrasound. FINDINGS: Lower chest: No acute findings. Hepatobiliary: No hepatic steatosis. There are at least 3 cystic rim enhancing lesions in the liver the largest of which is in the anterior left lobe of the liver measuring 1.7 cm on image 36/17 with that cysts demonstrating some internal debris, a smaller rim enhancing cystic lesion is visualized in the right lobe of the liver measuring 6 mm on image 34/22 with a similar appearing smaller 3 mm lesion in the lateral left lobe of the liver on image 33/22. There is a wedge-shaped area of increased arterial enhancement along the falciform ligament which surrounds the larger lesion as well as some increased T2 signal in this area suggestive of hepatic edema. There  are 2 nonenhancing simple cyst in the right lobe of the liver the largest of which measures 9 mm. Cholelithiasis measuring up to 5 mm without pericholecystic fluid or gallbladder wall thickening. There is mild intra and extrahepatic biliary ductal dilation with the common duct measuring 7 mm on image 10/10. There are 2 small choledocholithiasis in the distal common duct measuring up to 4 mm on image 16/3. There is no peribiliary enhancement. Pancreas: No mass, inflammatory changes, or other parenchymal abnormality identified. Spleen: There is a nonenhancing 4 mm cystic lesion in the spleen, likely a benign cyst or lymphangioma. Adrenals/Urinary Tract: Bilateral adrenal glands are unremarkable. No hydronephrosis. Small bilateral cysts measuring up to 7 mm in the left kidney. No solid enhancing renal lesions. Stomach/Bowel: Visualized portions within the abdomen are unremarkable. Vascular/Lymphatic: Aortic atherosclerosis without aneurysmal dilation. Other:  None. Musculoskeletal: No suspicious bone lesions identified. IMPRESSION: 1. There is mild intra and extrahepatic biliary ductal dilation with 2 small choledocholithiasis in the distal common duct measuring up to 4 mm. No peribiliary enhancement. 2. Rim enhancing cystic hepatic lesions measuring up to 1.7 cm as well as 2 smaller subcentimeter lesions, with this larger lesion demonstrating some internal complexity with associated hepatic edema, findings which are nonspecific but suspicious for hepatic abscess/micro abscesses within alternate diagnosis including biliary cystadenoma. Recommend correlation with laboratory values for signs of infection. There are no MRI findings to suggest ascending cholangitis, as such recommend CT abdomen and pelvis to assess for a non biliary source of infection if continued clinical concern for infection. 3. Cholelithiasis without evidence of acute cholecystitis. These results were called by telephone at the time of interpretation  on 09/18/2020 at 5:19 pm to provider Dr. FNickolas Madrid who verbally acknowledged these results. Electronically Signed   By: JDahlia BailiffMD  On: 09/18/2020 17:21   US ABDOMEN LIMITED RUQ (LIVER/GB)  Result Date: 09/18/2020 CLINICAL DATA:  Right upper quadrant pain for 2 days, elevated liver function tests. EXAM: ULTRASOUND ABDOMEN LIMITED RIGHT UPPER QUADRANT COMPARISON:  None. FINDINGS: Gallbladder: A mobile 1.1 cm gallstone is noted. No wall thickening visualized. No sonographic Murphy sign noted by sonographer. Common bile duct: Diameter: 3 mm Liver: A 1 cm cyst is seen in the right hepatic lobe. Within normal limits in parenchymal echogenicity. Portal vein is patent on color Doppler imaging with normal direction of blood flow towards the liver. Other: None. IMPRESSION: Cholelithiasis without evidence of acute cholecystitis. Electronically Signed   By: Zerita Boers M.D.   On: 09/18/2020 12:56      Impression / Plan:   Wanda [name] is a 65 y.o. y/o female with nausea, vomiting , diarrhea, acute rise in transaminases , T bil 1.6, MRCP showed choledocholithiasis. No fever or elevated WCC/Lipase  or AKI and T bilirubin not elevated.   Plan  1. ERCP on Monday with Dr Allen Norris  2. Rehydrate 3. Watch for features of cholangitis, follow-up CT scan results.  4. Consult surgery for timing of Cholecystectomy .  5.  She has palpitations which she says she is being evaluated for.  Probably would be best to obtain an okay from cardiology for procedure on Monday. 6.  Can have a soft diet today and tomorrow.  N.p.o. from tomorrow midnight  I have discussed alternative options, risks & benefits,  which include, but are not limited to, bleeding, pancreatitis, infection, perforation,respiratory complication & drug reaction and occasionally death.  The patient agrees with this plan & written consent will be obtained.    Thank you for involving me in the care of this patient.      LOS: 0 days   Jonathon Bellows, MD   09/18/2020, 9:01 PM

## 2020-09-18 NOTE — ED Triage Notes (Addendum)
Patient drove herself to the ED today for nausea, vomiting and diarrhea. Symptoms started yesterday. Patient has never had COVID prior to today. Patient has taken OTC pepto x 1 with no relief. Has had 4 episodes of vomiting and  2 episodes of diarrhea. Patient is tachycardic and diaphoretic. She advised she is working with a PCP about her fast HR. Doctor End is who is following her heart issues,.

## 2020-09-18 NOTE — ED Notes (Signed)
Patient ambulated to bathroom.

## 2020-09-18 NOTE — ED Provider Notes (Signed)
[facility] Emergency Department Provider Note ____________________________________________   Event Date/Time   First MD Initiated Contact with Patient 09/18/20 1113     (approximate)  I have reviewed the triage vital signs and the nursing notes.  HISTORY  Chief Complaint Nausea   HPI Wanda [name] is a 65 y.o. femalewho presents to the ED for evaluation of nausea, vomiting and diarrhea.  Chart review indicates history of persistent sinus tachycardia, HLD, anxiety and depression.  Recent Zio patch monitor due to Tachycardia.  Predominantly sinus tachycardia with rates ranging 55-180, average 93 bpm.  Rare PACs and occasional PVC with only 1% PVC burden.  No sustained dysrhythmias or pauses.  Patient presents to the ED for evaluation of 2 days of nausea, vomiting and diarrhea.  She reports associated subjective fevers.  Denies hematemesis, hematochezia, melena.    Past Medical History:  Diagnosis Date  . Anxiety   . Basal cell carcinoma 05/03/2016   Left deltoid. Superficial.  . Basal cell carcinoma 05/03/2016   Left distal lat. tricep. Nodular.  . Borderline diabetes   . BRONCHITIS, ACUTE WITH BRONCHOSPASM 06/25/2010   Qualifier: Diagnosis of  By: Alveta Heimlich MD, Cornelia Copa    . Chronic prescription benzodiazepine use 10/18/2015  . Depression   . Dysrhythmia    tachycardia  . GASTROENTERITIS WITHOUT DEHYDRATION 05/14/2009   Qualifier: Diagnosis of  By: Deborra Medina MD, Tanja Port    . GERD (gastroesophageal reflux disease)   . Herpes   . 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 History of kidney stones   . Hyperlipidemia   . Hypertension   . Irregular heart beat   . Pre-diabetes   . Sinus tachycardia 04/11/2017  . Skin cancer of face 08/01/2018  . 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 Dossie Arbour Serial number: [id number] Model number: [id number]     Patient  Active Problem List   Diagnosis Date Noted  . Insomnia 06/23/2020  . 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 Bereavement 12/05/2019  . MDD (major depressive disorder), recurrent, in full remission ([place]) 09/15/2019  . 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 PTSD (post-traumatic stress disorder) 01/14/2019  . GAD (generalized anxiety disorder) 11/21/2018  . MDD (major depressive disorder), recurrent episode, moderate ([place]) 11/21/2018  . Social anxiety disorder 11/21/2018  . Skin cancer of face 08/01/2018  . Hx of nonmelanoma skin cancer 08/01/2018  . Essential hypertension 07/08/2018  . Palpitations 04/03/2018  . Chronic musculoskeletal pain 07/23/2017  . Trigger point with back pain 07/23/2017  . Lumbar spondylosis w/o Radiculopathy 07/16/2017  . Chronic sacroiliac joint pain (Left) 07/16/2017  . Overweight 05/30/2017  . Tachycardia 04/11/2017  . Pharmacologic therapy 02/13/2017  . Polypharmacy 02/13/2017  . Disorder of skeletal system 02/13/2017  . Problems influencing health status 02/13/2017  . Compression fracture of L1 lumbar vertebra (old) 02/12/2017  . History of chest pain 02/06/2017  . Chronic pain syndrome 01/04/2017  . Chest pain of uncertain etiology 97/98/9211  . Closed intertrochanteric fracture ([place]) 08/18/2016  . History of hip fracture 08/18/2016  . Orthostatic hypotension 08/10/2016  . Closed fracture of hip ([place]) 05/29/2016  . Chronic neck pain 02/02/2016  . Chronic prescription benzodiazepine use 10/18/2015  . Chronic low back pain (Primary Source of Pain) (Bilateral) (R>L) 10/18/2015  . 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 Lumbar spondylosis 10/18/2015  . Chronic lower extremity pain (Left) 10/18/2015  .  Lumbar facet arthropathy 10/18/2015  . Lumbar facet syndrome (Bilateral) (R>L) 10/18/2015  . Chronic lower extremity pain (Secondary source of pain) (Right) 10/18/2015  . Chronic hip pain (Right)  10/18/2015  . Osteoarthritis of hip (Right) 10/18/2015  . Prediabetes 10/01/2015  . Hyperglycemia 10/01/2015  . Other specified anxiety disorders 09/02/2015  . Chronic constipation 12/03/2009  . Osteopenia 08/17/2009  . Mixed hyperlipidemia 06/29/2009  . Hypokalemia 12/11/2008  . Anxiety and depression 12/11/2008  . Common migraine 12/11/2008  . Paroxysmal SVT (supraventricular tachycardia) ([place]) 12/11/2008  . Allergic rhinitis 12/11/2008  . Gastroesophageal reflux disease 12/11/2008  . Generalized osteoarthrosis of hand 12/11/2008  . Osteoporosis 12/11/2008  . Mixed urinary incontinence 12/11/2008  . History of nephrolithiasis 12/11/2008    Past Surgical History:  Procedure Laterality Date  . ABDOMINAL HYSTERECTOMY    . APPENDECTOMY    . BACK SURGERY     X 3  . BREAST EXCISIONAL BIOPSY Right    surgical bx age 33   . COLONOSCOPY N/A 08/28/2013   Procedure: COLONOSCOPY;  Surgeon: Danie Binder, MD;  Location: [facility];  Service: Endoscopy;  Laterality: N/A;  8:30 AM  . INTRAMEDULLARY (IM) NAIL INTERTROCHANTERIC Left 05/30/2016   Procedure: INTRAMEDULLARY (IM) NAIL INTERTROCHANTRIC;  Surgeon: Thornton Park, MD;  Location: [facility] ORS;  Service: Orthopedics;  Laterality: Left;  . SHOULDER SURGERY Right   . Spinal cord stimulator    . SPINAL CORD STIMULATOR REMOVAL N/A 01/12/2020   Procedure: REMOVAL SPINAL CORD STIMULATOR AND PULSE GENERATOR;  Surgeon: Deetta Perla, MD;  Location: [facility] ORS;  Service: Neurosurgery;  Laterality: N/A;  Local w/ MAC    Prior to Admission medications   Medication Sig Start Date End Date Taking? Authorizing Provider  atorvastatin (LIPITOR) 40 MG tablet Take 40 mg by mouth daily. PM    [provider]  cetirizine (ZYRTEC) 10 MG tablet Take 10 mg by mouth daily.  10/01/18 07/30/30  [provider]  Cholecalciferol (VITAMIN D3) 5000 units TABS Take 5,000 Units by mouth daily.    [provider]  dexlansoprazole (DEXILANT) 60 MG  capsule Take 60 mg by mouth daily.  03/15/18 07/30/30  [provider]  famotidine (PEPCID) 40 MG tablet Take 40 mg by mouth daily.     [provider]  hydrOXYzine (VISTARIL) 25 MG capsule TAKE 1 CAPSULE BY MOUTH THREE TIMES DAILY AS NEEDED FOR SEVERE ANXIETY 08/25/20   Ursula Alert, MD  LORazepam (ATIVAN) 1 MG tablet Take 0.5 tablets (0.5 mg total) by mouth as directed. Take half tablet ( 0.5 mg) daily once as needed for severe anxiety attacks only. 08/20/20   Ursula Alert, MD  metoprolol succinate (TOPROL-XL) 25 MG 24 hr tablet TAKE 1 TABLET(25 MG) BY MOUTH DAILY 07/13/20   End, Harrell Gave, MD  Multiple Vitamin (MULTIVITAMIN) tablet Take 1 tablet by mouth daily.    [provider]  Naloxone HCl 0.4 MG/0.4ML SOAJ Take if having symptoms of opioid overdose including extreme sleepiness or stopping breathing 01/12/20   Zdeb, Christine, NP  potassium chloride (KLOR-CON) 20 MEQ packet Take 20 mEq by mouth 2 (two) times daily.    [provider]  Vilazodone HCl (VIIBRYD) 40 MG TABS DAILY 04/16/20   Ursula Alert, MD  zaleplon (SONATA) 5 MG capsule Take 1 capsule (5 mg total) by mouth at bedtime as needed for sleep. 09/13/20   Ursula Alert, MD    Allergies Levofloxacin, Tramadol, Codeine, Mobic [meloxicam], and Propoxyphene hcl  Family History  Problem Relation Age  of Onset  . Diabetes Mother   . COPD Mother   . Heart disease Mother   . Diabetes Father   . COPD Father   . Heart disease Father   . Heart attack Father 70       CABG  . Alcohol abuse Daughter   . Depression Daughter   . Drug abuse Daughter   . Anxiety disorder Daughter   . Tuberculosis Maternal Aunt   . Cancer Paternal Aunt        breast  . Breast cancer Paternal Aunt   . Diabetes Paternal Grandmother   . Diabetes Sister   . Cancer Cousin   . Diabetes Cousin   . Colon cancer Neg Hx   . Sudden Cardiac Death Neg Hx     Social History Social History   Tobacco Use  . Smoking  status: Former Smoker    Packs/day: 0.25    Years: 14.00    Pack years: 3.50    Types: Cigarettes    Quit date: 1992    Years since quitting: 30.3  . Smokeless tobacco: Never Used  Vaping Use  . Vaping Use: Never used  Substance Use Topics  . Alcohol use: Yes    Comment: occasional; once every 6 months  . Drug use: No    Review of Systems  Constitutional: No fever/chills Eyes: No visual changes. ENT: No sore throat. Cardiovascular: Denies chest pain. Respiratory: Denies shortness of breath. Gastrointestinal: Positive for nausea, vomiting and diarrhea with abdominal cramping   No constipation. Genitourinary: Negative for dysuria. Musculoskeletal: Negative for back pain. Skin: Negative for rash. Neurological: Negative for headaches, focal weakness or numbness.  ____________________________________________   PHYSICAL EXAM:  VITAL SIGNS: Vitals:   09/18/20 1500 09/18/20 1530  BP: (!) 124/103 135/77  Pulse: (!) 113 (!) 115  Resp: 17 14  Temp:    SpO2: 99% 100%      Constitutional: Alert and oriented.  Appears uncomfortable.  Obese. Eyes: Conjunctivae are normal. PERRL. EOMI. Head: Atraumatic. Nose: No congestion/rhinnorhea. Mouth/Throat: Mucous membranes are moist.  Oropharynx non-erythematous. Neck: No stridor. No cervical spine tenderness to palpation. Cardiovascular: Tachycardic rate, regular rhythm. Grossly normal heart sounds.  Good peripheral circulation. Respiratory: Normal respiratory effort.  No retractions. Lungs CTAB. Gastrointestinal: Soft , nondistended, RUQ tenderness without peritoneal features.  Otherwise benign abdomen. Marland Kitchen No CVA tenderness. Musculoskeletal: No lower extremity tenderness nor edema.  No joint effusions. No signs of acute trauma. Neurologic:  Normal speech and language. No gross focal neurologic deficits are appreciated. No gait instability noted. Skin:  Skin is warm, dry and intact. No rash noted. Psychiatric: Mood and affect are  normal. Speech and behavior are normal.  ____________________________________________   LABS (all labs ordered are listed, but only abnormal results are displayed)  Labs Reviewed  COMPREHENSIVE METABOLIC PANEL - Abnormal; Notable for the following components:      Result Value   Potassium 3.4 (*)    CO2 19 (*)    Glucose, Bld 133 (*)    BUN 6 (*)    Total Protein 8.2 (*)    AST 82 (*)    ALT 224 (*)    Alkaline Phosphatase 192 (*)    Total Bilirubin 1.6 (*)    Anion gap 17 (*)    All other components within normal limits  URINALYSIS, COMPLETE (UACMP) WITH MICROSCOPIC - Abnormal; Notable for the following components:   Color, Urine YELLOW (*)    APPearance HAZY (*)    Specific Gravity,  Urine 1.004 (*)    Hgb urine dipstick SMALL (*)    Ketones, ur 80 (*)    Leukocytes,Ua MODERATE (*)    WBC, UA >50 (*)    Bacteria, UA FEW (*)    All other components within normal limits  URINE CULTURE  LIPASE, BLOOD  CBC   ____________________________________________  12 Lead EKG  Sinus rhythm, rate of 140 bpm.  Normal axis and intervals.  No evidence of acute ischemia.  Sinus tachycardia. ____________________________________________  RADIOLOGY  ED MD interpretation: Cardiac ultrasound reviewed by me with cholelithiasis without evidence of acute cholecystitis  Official radiology report(s): US ABDOMEN LIMITED RUQ (LIVER/GB)  Result Date: 09/18/2020 CLINICAL DATA:  Right upper quadrant pain for 2 days, elevated liver function tests. EXAM: ULTRASOUND ABDOMEN LIMITED RIGHT UPPER QUADRANT COMPARISON:  None. FINDINGS: Gallbladder: A mobile 1.1 cm gallstone is noted. No wall thickening visualized. No sonographic Murphy sign noted by sonographer. Common bile duct: Diameter: 3 mm Liver: A 1 cm cyst is seen in the right hepatic lobe. Within normal limits in parenchymal echogenicity. Portal vein is patent on color Doppler imaging with normal direction of blood flow towards the liver. Other: None.  IMPRESSION: Cholelithiasis without evidence of acute cholecystitis. Electronically Signed   By: Zerita Boers M.D.   On: 09/18/2020 12:56    ____________________________________________   PROCEDURES and INTERVENTIONS  Procedure(s) performed (including Critical Care):  .1-3 Lead EKG Interpretation Performed by: Vladimir Crofts, MD Authorized by: Vladimir Crofts, MD     Interpretation: abnormal     ECG rate:  110   ECG rate assessment: tachycardic     Rhythm: sinus tachycardia     Ectopy: none     Conduction: normal      Medications  lactated ringers bolus 2,000 mL (2,000 mLs Intravenous New Bag/Given 09/18/20 1138)  ondansetron (ZOFRAN) injection 4 mg (4 mg Intravenous Given 09/18/20 1137)  cefTRIAXone (ROCEPHIN) 1 g in sodium chloride 0.9 % 100 mL IVPB (0 g Intravenous Stopped 09/18/20 1503)    ____________________________________________   MDM / ED COURSE   65 year old woman with longstanding history of persistent sinus tachycardia presents to the ED with nausea, vomiting and diarrhea with evidence of acute cystitis and cholelithiasis, requiring MRCP to rule out choledocholithiasis.  She presents with her typical persistent tachycardia but hemodynamically stable.  Exam with mild RUQ tenderness without peritoneal features.  Abdomen is otherwise benign.  Cardiac ultrasound confirms cholelithiasis, but without evidence of acute cholecystitis.  Blood work with some signs of biliary obstruction concerning for the possibility of choledocholithiasis despite her RUQ ultrasound demonstrating a normal CBD diameter.  Urine with evidence of acute cystitis for which she received a dose of Rocephin.  MRCP pending at the time of signout to oncoming provider to evaluate for choledocholithiasis, if present patient may require transfer to institution capable of ERCP as we do not have this until Monday or Tuesday.  If this is negative, should be suitable for outpatient management with a course of antibiotics  to treat acute cystitis, and an outpatient surgical referral.  Clinical Course as of 09/18/20 1551  Sat Sep 18, 2020  1305 I discuss the case with Dr. Vicente Males who recommends MRCP [DS]  1457 US ABDOMEN LIMITED RUQ (LIVER/GB) [MF]    Clinical Course User Index [DS] Vladimir Crofts, MD [MF] Vanessa Wickett, MD    ____________________________________________   FINAL CLINICAL IMPRESSION(S) / ED DIAGNOSES  Final diagnoses:  RUQ abdominal pain  Acute cystitis without hematuria  Calculus of gallbladder  with biliary obstruction but without cholecystitis     ED Discharge Orders    None       Wanda [name]   Note:  This document was prepared using Dragon voice recognition software and may include unintentional dictation errors.   Vladimir Crofts, MD 09/18/20 [phone]

## 2020-09-18 NOTE — ED Notes (Signed)
Patient ambulated to toilet  

## 2020-09-18 NOTE — ED Notes (Signed)
Patient transported to MRI 

## 2020-09-19 ENCOUNTER — Other Ambulatory Visit: Payer: Self-pay

## 2020-09-19 ENCOUNTER — Encounter: Payer: Self-pay | Admitting: Family Medicine

## 2020-09-19 DIAGNOSIS — K7689 Other specified diseases of liver: Secondary | ICD-10-CM

## 2020-09-19 DIAGNOSIS — N390 Urinary tract infection, site not specified: Secondary | ICD-10-CM

## 2020-09-19 DIAGNOSIS — E010 Iodine-deficiency related diffuse (endemic) goiter: Secondary | ICD-10-CM

## 2020-09-19 DIAGNOSIS — R932 Abnormal findings on diagnostic imaging of liver and biliary tract: Secondary | ICD-10-CM

## 2020-09-19 DIAGNOSIS — K8050 Calculus of bile duct without cholangitis or cholecystitis without obstruction: Secondary | ICD-10-CM

## 2020-09-19 LAB — CBC
HCT: 32.2 % — ABNORMAL LOW (ref 36.0–46.0)
Hemoglobin: 11.1 g/dL — ABNORMAL LOW (ref 12.0–15.0)
MCH: 31.3 pg (ref 26.0–34.0)
MCHC: 34.5 g/dL (ref 30.0–36.0)
MCV: 90.7 fL (ref 80.0–100.0)
Platelets: 150 10*3/uL (ref 150–400)
RBC: 3.55 MIL/uL — ABNORMAL LOW (ref 3.87–5.11)
RDW: 12 % (ref 11.5–15.5)
WBC: 8.4 10*3/uL (ref 4.0–10.5)
nRBC: 0 % (ref 0.0–0.2)

## 2020-09-19 LAB — POTASSIUM: Potassium: 3.8 mmol/L (ref 3.5–5.1)

## 2020-09-19 LAB — COMPREHENSIVE METABOLIC PANEL
ALT: 132 U/L — ABNORMAL HIGH (ref 0–44)
AST: 41 U/L (ref 15–41)
Albumin: 3 g/dL — ABNORMAL LOW (ref 3.5–5.0)
Alkaline Phosphatase: 140 U/L — ABNORMAL HIGH (ref 38–126)
Anion gap: 10 (ref 5–15)
BUN: <5 mg/dL — ABNORMAL LOW (ref 8–23)
CO2: 21 mmol/L — ABNORMAL LOW (ref 22–32)
Calcium: 8.3 mg/dL — ABNORMAL LOW (ref 8.9–10.3)
Chloride: 107 mmol/L (ref 98–111)
Creatinine, Ser: 0.5 mg/dL (ref 0.44–1.00)
GFR, Estimated: >60 mL/min (ref >60)
Glucose, Bld: 98 mg/dL (ref 70–99)
Potassium: 2.8 mmol/L — ABNORMAL LOW (ref 3.5–5.1)
Sodium: 138 mmol/L (ref 135–145)
Total Bilirubin: 1.1 mg/dL (ref 0.3–1.2)
Total Protein: 6.2 g/dL — ABNORMAL LOW (ref 6.5–8.1)

## 2020-09-19 LAB — TSH: TSH: 3.7 u[IU]/mL (ref 0.350–4.500)

## 2020-09-19 LAB — MAGNESIUM: Magnesium: 1.7 mg/dL (ref 1.7–2.4)

## 2020-09-19 LAB — T4, FREE: Free T4: 1.58 ng/dL — ABNORMAL HIGH (ref 0.61–1.12)

## 2020-09-19 MED ORDER — POTASSIUM CHLORIDE 20 MEQ PO PACK
20.0000 meq | PACK | Freq: Two times a day (BID) | ORAL | Status: DC
  Administered 2020-09-19 – 2020-09-22 (×6): 20 meq via ORAL
  Filled 2020-09-19 (×7): qty 1

## 2020-09-19 MED ORDER — PANTOPRAZOLE SODIUM 40 MG PO TBEC
40.0000 mg | DELAYED_RELEASE_TABLET | Freq: Every day | ORAL | Status: DC
  Administered 2020-09-19 – 2020-09-22 (×4): 40 mg via ORAL
  Filled 2020-09-19 (×5): qty 1

## 2020-09-19 MED ORDER — HYDROXYZINE HCL 25 MG PO TABS
25.0000 mg | ORAL_TABLET | Freq: Three times a day (TID) | ORAL | Status: DC | PRN
  Administered 2020-09-19: 25 mg via ORAL
  Filled 2020-09-19: qty 1

## 2020-09-19 MED ORDER — METOPROLOL SUCCINATE ER 25 MG PO TB24
25.0000 mg | ORAL_TABLET | Freq: Every day | ORAL | Status: DC
  Administered 2020-09-19: 25 mg via ORAL
  Filled 2020-09-19: qty 1

## 2020-09-19 MED ORDER — VILAZODONE HCL 20 MG PO TABS
40.0000 mg | ORAL_TABLET | Freq: Every day | ORAL | Status: DC
  Administered 2020-09-19 – 2020-09-22 (×3): 40 mg via ORAL
  Filled 2020-09-19 (×9): qty 2

## 2020-09-19 MED ORDER — POTASSIUM CHLORIDE 10 MEQ/100ML IV SOLN
10.0000 meq | INTRAVENOUS | Status: AC
  Administered 2020-09-19 (×4): 10 meq via INTRAVENOUS
  Filled 2020-09-19 (×5): qty 100

## 2020-09-19 MED ORDER — METOPROLOL SUCCINATE ER 50 MG PO TB24
50.0000 mg | ORAL_TABLET | Freq: Every day | ORAL | Status: DC
  Administered 2020-09-20 – 2020-09-22 (×3): 50 mg via ORAL
  Filled 2020-09-19 (×4): qty 1

## 2020-09-19 MED ORDER — ZOLPIDEM TARTRATE 5 MG PO TABS: 5.0000 mg | ORAL_TABLET | Freq: Every evening | ORAL | Status: DC | PRN

## 2020-09-19 MED ORDER — POTASSIUM CHLORIDE CRYS ER 20 MEQ PO TBCR
40.0000 meq | EXTENDED_RELEASE_TABLET | Freq: Once | ORAL | Status: AC
  Administered 2020-09-19: 40 meq via ORAL
  Filled 2020-09-19: qty 2

## 2020-09-19 MED ORDER — LORAZEPAM 0.5 MG PO TABS: 0.5000 mg | ORAL_TABLET | Freq: Every day | ORAL | Status: DC | PRN

## 2020-09-19 MED ORDER — ATORVASTATIN CALCIUM 20 MG PO TABS
40.0000 mg | ORAL_TABLET | Freq: Every day | ORAL | Status: DC
  Administered 2020-09-19 – 2020-09-21 (×3): 40 mg via ORAL
  Filled 2020-09-19 (×3): qty 2

## 2020-09-19 MED ORDER — FAMOTIDINE 20 MG PO TABS
40.0000 mg | ORAL_TABLET | Freq: Every day | ORAL | Status: DC
  Administered 2020-09-19 – 2020-09-22 (×3): 40 mg via ORAL
  Filled 2020-09-19 (×4): qty 2

## 2020-09-19 NOTE — Consult Note (Addendum)
Date of Consultation:  09/19/2020  Requesting Physician:  Sharen Hones, MD  Reason for Consultation:  Choledocholithiasis  History of Present Illness: Wanda [name] is a 65 y.o. female admitted yesterday with 2-day history of nausea vomiting abdominal pain.  Patient denies any fevers but does report feeling hot at times.  Denies any chest pain, shortness of breath.  Right now she reports that she still feels some mild nausea and abdominal discomfort in the upper abdomen.  Denies any constipation or diarrhea at this point.  The work-up in the emergency room showed a WBC of 10.3, with a total bilirubin of 1.6, AST 82, ALT 224, alkaline phosphatase 192, and lipase 27.  She initially had an ultrasound which showed cholelithiasis without any cholecystitis and her common bile duct was only 3 mm in size, but given her LFT changes, she had an MRCP done as well.  MRCP showed choledocholithiasis with 2 small stones in the distal common bile duct.  I have personally reviewed all her imaging studies and agree with the findings.  GI was consulted and Dr. Vicente Males evaluate the patient.  She is currently scheduled for ERCP tomorrow with Dr. Allen Norris.  General surgery has been consulted for cholecystectomy.  Of note, the patient has a history of tachycardia and she was being evaluated by cardiology and was wearing a Holter monitor.  She is currently on metoprolol 25 mg daily.  Past Medical History: Past Medical History:  Diagnosis Date  . Anxiety   . Basal cell carcinoma 05/03/2016   Left deltoid. Superficial.  . Basal cell carcinoma 05/03/2016   Left distal lat. tricep. Nodular.  . Borderline diabetes   . BRONCHITIS, ACUTE WITH BRONCHOSPASM 06/25/2010   Qualifier: Diagnosis of  By: Alveta Heimlich MD, Cornelia Copa    . Chronic prescription benzodiazepine use 10/18/2015  . Depression   . Dysrhythmia    tachycardia  . GASTROENTERITIS WITHOUT DEHYDRATION 05/14/2009   Qualifier: Diagnosis of  By: Deborra Medina MD, Tanja Port    . GERD  (gastroesophageal reflux disease)   . Herpes   . 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 History of kidney stones   . Hyperlipidemia   . Hypertension   . Irregular heart beat   . Pre-diabetes   . Sinus tachycardia 04/11/2017  . Skin cancer of face 08/01/2018  . 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 Dossie Arbour Serial number: [id number] Model number: [id number]      Past Surgical History: Past Surgical History:  Procedure Laterality Date  . ABDOMINAL HYSTERECTOMY    . APPENDECTOMY    . BACK SURGERY     X 3  . BREAST EXCISIONAL BIOPSY Right    surgical bx age 29   . COLONOSCOPY N/A 08/28/2013   Procedure: COLONOSCOPY;  Surgeon: Danie Binder, MD;  Location: [facility];  Service: Endoscopy;  Laterality: N/A;  8:30 AM  . INTRAMEDULLARY (IM) NAIL INTERTROCHANTERIC Left 05/30/2016   Procedure: INTRAMEDULLARY (IM) NAIL INTERTROCHANTRIC;  Surgeon: Thornton Park, MD;  Location: [facility];  Service: Orthopedics;  Laterality: Left;  . SHOULDER SURGERY Right   . Spinal cord stimulator    . SPINAL CORD STIMULATOR REMOVAL N/A 01/12/2020   Procedure: REMOVAL SPINAL CORD STIMULATOR AND PULSE GENERATOR;  Surgeon: Deetta Perla, MD;  Location: [facility];  Service: Neurosurgery;  Laterality: N/A;  Local w/ MAC    Home Medications: Prior to Admission medications   Medication Sig Start Date  End Date Taking? Authorizing Provider  atorvastatin (LIPITOR) 40 MG tablet Take 40 mg by mouth daily. PM   Yes [provider]  Cholecalciferol (VITAMIN D3) 5000 units TABS Take 5,000 Units by mouth daily.   Yes [provider]  dexlansoprazole (DEXILANT) 60 MG capsule Take 60 mg by mouth daily.  03/15/18 07/30/30 Yes [provider]  famotidine (PEPCID) 40 MG tablet Take 40 mg by mouth daily.    Yes [provider]  hydrOXYzine (VISTARIL) 25 MG capsule TAKE 1 CAPSULE BY MOUTH  THREE TIMES DAILY AS NEEDED FOR SEVERE ANXIETY 08/25/20  Yes Eappen, Saramma, MD  LORazepam (ATIVAN) 1 MG tablet Take 0.5 tablets (0.5 mg total) by mouth as directed. Take half tablet ( 0.5 mg) daily once as needed for severe anxiety attacks only. 08/20/20  Yes Ursula Alert, MD  metoprolol succinate (TOPROL-XL) 25 MG 24 hr tablet TAKE 1 TABLET(25 MG) BY MOUTH DAILY 07/13/20  Yes End, Harrell Gave, MD  Multiple Vitamin (MULTIVITAMIN) tablet Take 1 tablet by mouth daily.   Yes [provider]  potassium chloride (KLOR-CON) 20 MEQ packet Take 20 mEq by mouth 2 (two) times daily.   Yes [provider]  Vilazodone HCl (VIIBRYD) 40 MG TABS DAILY 04/16/20  Yes Eappen, Ria Clock, MD  zaleplon (SONATA) 5 MG capsule Take 1 capsule (5 mg total) by mouth at bedtime as needed for sleep. 09/13/20  Yes Ursula Alert, MD  cetirizine (ZYRTEC) 10 MG tablet Take 10 mg by mouth daily.  Patient not taking: Reported on 09/19/2020 10/01/18 07/30/30  [provider]  Naloxone HCl 0.4 MG/0.4ML SOAJ Take if having symptoms of opioid overdose including extreme sleepiness or stopping breathing 01/12/20   Lonell Face, NP    Allergies: Allergies  Allergen Reactions  . Levofloxacin Hives and Nausea And Vomiting  . Tramadol Other (See Comments)    Severe headache  . Codeine Nausea Only    Slight nausea  . Mobic [Meloxicam] Other (See Comments)    Calf tightness  . Propoxyphene Hcl Rash    Social History:  reports that she quit smoking about 30 years ago. Her smoking use included cigarettes. She has a 3.50 pack-year smoking history. She has never used smokeless tobacco. She reports current alcohol use. She reports that she does not use drugs.   Family History: Family History  Problem Relation Age of Onset  . Diabetes Mother   . COPD Mother   . Heart disease Mother   . Diabetes Father   . COPD Father   . Heart disease Father   . Heart attack Father 14       CABG  . Alcohol abuse Daughter    . Depression Daughter   . Drug abuse Daughter   . Anxiety disorder Daughter   . Tuberculosis Maternal Aunt   . Cancer Paternal Aunt        breast  . Breast cancer Paternal Aunt   . Diabetes Paternal Grandmother   . Diabetes Sister   . Cancer Cousin   . Diabetes Cousin   . Colon cancer Neg Hx   . Sudden Cardiac Death Neg Hx     Review of Systems: Review of Systems  Constitutional: Negative for chills and fever.  HENT: Negative for hearing loss.   Respiratory: Negative for shortness of breath.   Cardiovascular: Negative for chest pain.  Gastrointestinal: Positive for abdominal pain, nausea and vomiting. Negative for constipation and diarrhea.  Genitourinary: Negative for dysuria.  Musculoskeletal: Negative for myalgias.  Skin: Negative for rash.  Neurological: Negative for dizziness.  Psychiatric/Behavioral: Negative for depression.    Physical Exam BP (!) 122/94 (BP Location: Left Arm)   Pulse (!) 110   Temp 99 F (37.2 C) (Oral)   Resp 18   Ht _0  (1.549 m)   Wt 65.8 kg   LMP 09/30/1991 (Approximate) Comment: 1993  SpO2 98%   BMI 27.40 kg/m  CONSTITUTIONAL: No acute distress HEENT:  Normocephalic, atraumatic, extraocular motion intact. NECK: Trachea is midline, and there is no jugular venous distension. RESPIRATORY:  Lungs are clear, and breath sounds are equal bilaterally. Normal respiratory effort without pathologic use of accessory muscles. CARDIOVASCULAR: Heart is regular without murmurs, gallops, or rubs. GI: The abdomen is soft, nondistended, with tenderness to palpation in the epigastric area and right upper quadrant but no peritonitis and negative Murphy's sign.  MUSCULOSKELETAL:  Normal muscle strength and tone in all four extremities.  No peripheral edema or cyanosis. SKIN: Skin turgor is normal. There are no pathologic skin lesions.  NEUROLOGIC:  Motor and sensation is grossly normal.  Cranial nerves are grossly intact. PSYCH:  Alert and oriented to  person, place and time. Affect is normal.  Laboratory Analysis: Results for orders placed or performed during the hospital encounter of 09/18/20 (from the past 24 hour(s))  Resp Panel by RT-PCR (Flu A&B, Covid) Nasopharyngeal Swab     Status: None   Collection Time: 09/18/20  5:40 PM   Specimen: Nasopharyngeal Swab; Nasopharyngeal(NP) swabs in vial transport medium  Result Value Ref Range   SARS Coronavirus 2 by RT PCR NEGATIVE NEGATIVE   Influenza A by PCR NEGATIVE NEGATIVE   Influenza B by PCR NEGATIVE NEGATIVE  Comprehensive metabolic panel     Status: Abnormal   Collection Time: 09/19/20  4:14 AM  Result Value Ref Range   Sodium 138 135 - 145 mmol/L   Potassium 2.8 (L) 3.5 - 5.1 mmol/L   Chloride 107 98 - 111 mmol/L   CO2 21 (L) 22 - 32 mmol/L   Glucose, Bld 98 70 - 99 mg/dL   BUN <5 (L) 8 - 23 mg/dL   Creatinine, Ser 0.50 0.44 - 1.00 mg/dL   Calcium 8.3 (L) 8.9 - 10.3 mg/dL   Total Protein 6.2 (L) 6.5 - 8.1 g/dL   Albumin 3.0 (L) 3.5 - 5.0 g/dL   AST 41 15 - 41 U/L   ALT 132 (H) 0 - 44 U/L   Alkaline Phosphatase 140 (H) 38 - 126 U/L   Total Bilirubin 1.1 0.3 - 1.2 mg/dL   GFR, Estimated >60 >60 mL/min   Anion gap 10 5 - 15  CBC     Status: Abnormal   Collection Time: 09/19/20  4:14 AM  Result Value Ref Range   WBC 8.4 4.0 - 10.5 K/uL   RBC 3.55 (L) 3.87 - 5.11 MIL/uL   Hemoglobin 11.1 (L) 12.0 - 15.0 g/dL   HCT 32.2 (L) 36.0 - 46.0 %   MCV 90.7 80.0 - 100.0 fL   MCH 31.3 26.0 - 34.0 pg   MCHC 34.5 30.0 - 36.0 g/dL   RDW 12.0 11.5 - 15.5 %   Platelets 150 150 - 400 K/uL   nRBC 0.0 0.0 - 0.2 %  Magnesium     Status: None   Collection Time: 09/19/20  4:14 AM  Result Value Ref Range   Magnesium 1.7 1.7 - 2.4 mg/dL  TSH     Status: None   Collection Time: 09/19/20  4:14 AM  Result Value Ref Range   TSH 3.700 0.350 - 4.500 uIU/mL  T4, free     Status: Abnormal   Collection Time: 09/19/20  4:14 AM  Result Value Ref Range   Free T4 1.58 (H) 0.61 - 1.12 ng/dL     Imaging: CT ABDOMEN PELVIS W CONTRAST  Result Date: 09/18/2020 CLINICAL DATA:  Abdominal distension, choledochal lithiasis seen on prior MRCP. EXAM: CT ABDOMEN AND PELVIS WITH CONTRAST TECHNIQUE: Multidetector CT imaging of the abdomen and pelvis was performed using the standard protocol following bolus administration of intravenous contrast. CONTRAST:  154m OMNIPAQUE IOHEXOL 300 MG/ML  SOLN COMPARISON:  MRCP dated 09/18/2020. FINDINGS: Lower chest: No acute abnormality. Hepatobiliary: A hypoattenuating lesion in the left hepatic lobe measures 1.7 cm and demonstrated internal debris and rim enhancement on same day MRCP (series 2, image 20). A 9 mm benign cyst is seen in the right hepatic lobe (series 2, image 22). A few additional lesions identified on MRCP are less well seen on this exam. No gallbladder wall thickening. The common bile duct is mildly dilated, measuring up to 6 mm. The patient's known cholelithiasis and choledocholithiasis is not well seen on this exam. Pancreas: Unremarkable. No pancreatic ductal dilatation or surrounding inflammatory changes. Spleen: A 4 mm hypoattenuating lesion in the spleen is consistent with a benign cyst. Adrenals/Urinary Tract: Adrenal glands are unremarkable. Bilateral renal cysts measure up to 7 mm. Nonobstructive renal calculi measure 2 mm, one in each kidney. No hydronephrosis. Bladder is unremarkable. Stomach/Bowel: Stomach is within normal limits. No pericecal inflammatory changes are noted to suggest acute appendicitis. No evidence of bowel wall thickening, distention, or inflammatory changes. Vascular/Lymphatic: No significant vascular findings are present. No enlarged abdominal or pelvic lymph nodes. Reproductive: Status post hysterectomy. No adnexal masses. Other: No abdominal wall hernia or abnormality. No abdominopelvic ascites. Musculoskeletal: Degenerative changes are seen in the spine. The patient is status post internal fixation of the proximal left  femur. There is a chronic appearing compression deformity of L1. IMPRESSION: 1. Known cholelithiasis and choledocholithiasis better characterized on same day MRCP. The common bile duct is mildly dilated, measuring up to 6 mm. 2. Multiple hepatic lesions are better characterized on same day MRCP. Aortic Atherosclerosis (ICD10-I70.0). Electronically Signed   By: TZerita BoersM.D.   On: 09/18/2020 18:42   MR 3D Recon At Scanner  Result Date: 09/18/2020 CLINICAL DATA:  Nausea vomiting and diarrhea with laboratory signs of biliary obstruction. EXAM: MRI ABDOMEN WITHOUT AND WITH CONTRAST (INCLUDING MRCP) TECHNIQUE: Multiplanar multisequence MR imaging of the abdomen was performed both before and after the administration of intravenous contrast. Heavily T2-weighted images of the biliary and pancreatic ducts were obtained, and three-dimensional MRCP images were rendered by post processing. CONTRAST:  778mGADAVIST GADOBUTROL 1 MMOL/ML IV SOLN COMPARISON:  Same day ultrasound. FINDINGS: Lower chest: No acute findings. Hepatobiliary: No hepatic steatosis. There are at least 3 cystic rim enhancing lesions in the liver the largest of which is in the anterior left lobe of the liver measuring 1.7 cm on image 36/17 with that cysts demonstrating some internal debris, a smaller rim enhancing cystic lesion is visualized in the right lobe of the liver measuring 6 mm on image 34/22 with a similar appearing smaller 3 mm lesion in the lateral left lobe of the liver on image 33/22. There is a wedge-shaped area of increased arterial enhancement along the falciform ligament which surrounds the larger lesion as well as some increased T2 signal in this  area suggestive of hepatic edema. There are 2 nonenhancing simple cyst in the right lobe of the liver the largest of which measures 9 mm. Cholelithiasis measuring up to 5 mm without pericholecystic fluid or gallbladder wall thickening. There is mild intra and extrahepatic biliary ductal  dilation with the common duct measuring 7 mm on image 10/10. There are 2 small choledocholithiasis in the distal common duct measuring up to 4 mm on image 16/3. There is no peribiliary enhancement. Pancreas: No mass, inflammatory changes, or other parenchymal abnormality identified. Spleen: There is a nonenhancing 4 mm cystic lesion in the spleen, likely a benign cyst or lymphangioma. Adrenals/Urinary Tract: Bilateral adrenal glands are unremarkable. No hydronephrosis. Small bilateral cysts measuring up to 7 mm in the left kidney. No solid enhancing renal lesions. Stomach/Bowel: Visualized portions within the abdomen are unremarkable. Vascular/Lymphatic: Aortic atherosclerosis without aneurysmal dilation. Other:  None. Musculoskeletal: No suspicious bone lesions identified. IMPRESSION: 1. There is mild intra and extrahepatic biliary ductal dilation with 2 small choledocholithiasis in the distal common duct measuring up to 4 mm. No peribiliary enhancement. 2. Rim enhancing cystic hepatic lesions measuring up to 1.7 cm as well as 2 smaller subcentimeter lesions, with this larger lesion demonstrating some internal complexity with associated hepatic edema, findings which are nonspecific but suspicious for hepatic abscess/micro abscesses within alternate diagnosis including biliary cystadenoma. Recommend correlation with laboratory values for signs of infection. There are no MRI findings to suggest ascending cholangitis, as such recommend CT abdomen and pelvis to assess for a non biliary source of infection if continued clinical concern for infection. 3. Cholelithiasis without evidence of acute cholecystitis. These results were called by telephone at the time of interpretation on 09/18/2020 at 5:19 pm to provider Dr. Nickolas Madrid, who verbally acknowledged these results. Electronically Signed   By: Dahlia Bailiff MD   On: 09/18/2020 17:21   MR ABDOMEN MRCP W WO CONTAST  Result Date: 09/18/2020 CLINICAL DATA:  Nausea vomiting  and diarrhea with laboratory signs of biliary obstruction. EXAM: MRI ABDOMEN WITHOUT AND WITH CONTRAST (INCLUDING MRCP) TECHNIQUE: Multiplanar multisequence MR imaging of the abdomen was performed both before and after the administration of intravenous contrast. Heavily T2-weighted images of the biliary and pancreatic ducts were obtained, and three-dimensional MRCP images were rendered by post processing. CONTRAST:  42m GADAVIST GADOBUTROL 1 MMOL/ML IV SOLN COMPARISON:  Same day ultrasound. FINDINGS: Lower chest: No acute findings. Hepatobiliary: No hepatic steatosis. There are at least 3 cystic rim enhancing lesions in the liver the largest of which is in the anterior left lobe of the liver measuring 1.7 cm on image 36/17 with that cysts demonstrating some internal debris, a smaller rim enhancing cystic lesion is visualized in the right lobe of the liver measuring 6 mm on image 34/22 with a similar appearing smaller 3 mm lesion in the lateral left lobe of the liver on image 33/22. There is a wedge-shaped area of increased arterial enhancement along the falciform ligament which surrounds the larger lesion as well as some increased T2 signal in this area suggestive of hepatic edema. There are 2 nonenhancing simple cyst in the right lobe of the liver the largest of which measures 9 mm. Cholelithiasis measuring up to 5 mm without pericholecystic fluid or gallbladder wall thickening. There is mild intra and extrahepatic biliary ductal dilation with the common duct measuring 7 mm on image 10/10. There are 2 small choledocholithiasis in the distal common duct measuring up to 4 mm on image 16/3. There is no peribiliary  enhancement. Pancreas: No mass, inflammatory changes, or other parenchymal abnormality identified. Spleen: There is a nonenhancing 4 mm cystic lesion in the spleen, likely a benign cyst or lymphangioma. Adrenals/Urinary Tract: Bilateral adrenal glands are unremarkable. No hydronephrosis. Small bilateral  cysts measuring up to 7 mm in the left kidney. No solid enhancing renal lesions. Stomach/Bowel: Visualized portions within the abdomen are unremarkable. Vascular/Lymphatic: Aortic atherosclerosis without aneurysmal dilation. Other:  None. Musculoskeletal: No suspicious bone lesions identified. IMPRESSION: 1. There is mild intra and extrahepatic biliary ductal dilation with 2 small choledocholithiasis in the distal common duct measuring up to 4 mm. No peribiliary enhancement. 2. Rim enhancing cystic hepatic lesions measuring up to 1.7 cm as well as 2 smaller subcentimeter lesions, with this larger lesion demonstrating some internal complexity with associated hepatic edema, findings which are nonspecific but suspicious for hepatic abscess/micro abscesses within alternate diagnosis including biliary cystadenoma. Recommend correlation with laboratory values for signs of infection. There are no MRI findings to suggest ascending cholangitis, as such recommend CT abdomen and pelvis to assess for a non biliary source of infection if continued clinical concern for infection. 3. Cholelithiasis without evidence of acute cholecystitis. These results were called by telephone at the time of interpretation on 09/18/2020 at 5:19 pm to provider Dr. Nickolas Madrid, who verbally acknowledged these results. Electronically Signed   By: Dahlia Bailiff MD   On: 09/18/2020 17:21   US ABDOMEN LIMITED RUQ (LIVER/GB)  Result Date: 09/18/2020 CLINICAL DATA:  Right upper quadrant pain for 2 days, elevated liver function tests. EXAM: ULTRASOUND ABDOMEN LIMITED RIGHT UPPER QUADRANT COMPARISON:  None. FINDINGS: Gallbladder: A mobile 1.1 cm gallstone is noted. No wall thickening visualized. No sonographic Murphy sign noted by sonographer. Common bile duct: Diameter: 3 mm Liver: A 1 cm cyst is seen in the right hepatic lobe. Within normal limits in parenchymal echogenicity. Portal vein is patent on color Doppler imaging with normal direction of blood flow  towards the liver. Other: None. IMPRESSION: Cholelithiasis without evidence of acute cholecystitis. Electronically Signed   By: Zerita Boers M.D.   On: 09/18/2020 12:56    Assessment and Plan: This is a 65 y.o. female with choledocholithiasis without cholecystitis.  - The patient will be undergoing ERCP tomorrow with Dr. Allen Norris.  Discussed with the patient that typically the recommendation would be to proceed with cholecystectomy in the same admission but if she preferred we could also do this as an outpatient procedure.  She would rather have this done while she is here which I think is perfectly reasonable.  Pending how the ERCP goes and that there were no complications, we will plan on scheduling her for robotic cholecystectomy on Tuesday, 09/21/2020. -Of note, the patient's MRCP showed potential multiple cystic lesions within the liver concerning for possible abscess.  However the patient does not show any signs of cholangitis or any other infection.  CT scan of the abdomen pelvis was also done which did not show any other issues intra-abdominally. - We will follow along with you.   Face-to-face time spent with the patient and care providers was 80 minutes, with more than 50% of the time spent counseling, educating, and coordinating care of the patient.     Melvyn Neth, MD [facility] Pg:  [phone]

## 2020-09-19 NOTE — Progress Notes (Addendum)
PROGRESS NOTE    Wanda [name]  [id number] DOB: 1955/06/25 DOA: 09/18/2020 PCP: Barbaraann Boys, MD   Chief complaint.  Abdominal pain. Brief Narrative:  Wanda [name] is a 65 y.o. female with medical history significant for chronic pain, migraine, paroxysmal SVT, hypertension, hyperlipidemia, anxiety, depression, PTSD, prediabetes and GERD who presents with concerns of abdominal pain. Total bilirubin 1.6, MRCP was performed, showed a mild intra and extrahepatic biliary duct dilation and 4 mm stone in the distal CBD. Patient did not show any evidence of infection.  Patient has been seen by GI, scheduled for ERCP on Monday.  General surgery is also consulted for cholelithiasis.   Assessment & Plan:   Principal Problem:   Choledocholithiasis Active Problems:   Hypokalemia   Anxiety and depression   Gastroesophageal reflux disease   Tachycardia   Essential hypertension   GAD (generalized anxiety disorder)   Acute lower UTI   Hepatic cyst  1. Choledocholithiasis without cholangitis. Patient has a mild elevation of liver enzyme without significant elevation of  Bilirubin. Abdominal pain is better. She is scheduled for ERCP on Monday tomorrow, also consulted general surgery for cholecystectomy.  2.  Thyromegaly. Sinus tachycardia PSVT. Patient has diffuse enlargement of the thyroid, she has been having tachycardia for a year, she had a Holter monitor recently, showed PSVT.  She also had intermittent diarrhea.  She also has significant anxiety.  Most likely she has hyperthyroidism.  However, her TSH is normal, free T4 is elevated.  I will also add a T3 and a free T3. If both T4 and T3 are elevated, patient need to treat for hyperthyroidism. I have increased her metoprolol to 50 mg daily for now.  3. UTI. Patient was started on Rocephin, will continue.  4.  Hepatic cyst. MRCP suspect patient may have a liver abscess.  However, CT scan and a right upper quadrant ultrasound  appear to be a simple cyst. Follow.  5.  Hypokalemia. We will give IV potassium, recheck potassium level in the afternoon.      DVT prophylaxis: SCDs Code Status: Full Family Communication: Patient has full mental capacity, all information explained to the patient. Disposition Plan:  .   Status is: Inpatient  Remains inpatient appropriate because:Inpatient level of care appropriate due to severity of illness   Dispo: The patient is from: Home              Anticipated d/c is to: Home              Patient currently is not medically stable to d/c.   Difficult to place patient No        I/O last 3 completed shifts: In: 240 [P.O.:240] Out: -  No intake/output data recorded.     Consultants:   GI, General surgery  Procedures: Pending  Antimicrobials: Rocephin  Subjective: Patient feel anxious, she also has intermittent abdominal pain.  Intermittent nausea, no vomiting. She does not have any fever or chills. She does not have a chest pain, she has occasional palpitation. She denies any short of breath or cough. She does not have any dysuria hematuria.  She has no back pain, she has no headache or dizziness.  Objective: Vitals:   09/18/20 2220 09/19/20 0457 09/19/20 0847 09/19/20 0850  BP: (!) 146/97 129/76 106/81 (!) 122/93  Pulse: (!) 109 (!) 109 (!) 101 (!) 105  Resp: 18 17 19    Temp: 99.6 F (37.6 C) 98.6 F (37 C) 98.3 F (36.8 C)  TempSrc: Oral Oral    SpO2: 100% 99% 100%   Weight:      Height:        Intake/Output Summary (Last 24 hours) at 09/19/2020 0930 Last data filed at 09/19/2020 0645 Gross per 24 hour  Intake 240 ml  Output --  Net 240 ml   Filed Weights   09/18/20 1107  Weight: 65.8 kg    Examination:  General exam: Appears anxious and comfortable  Respiratory system: Clear to auscultation. Respiratory effort normal. Cardiovascular system: Regular and tachycardic. no JVD, murmurs, rubs, gallops or clicks. No pedal  edema. Gastrointestinal system: Abdomen is nondistended, soft and nontender. No organomegaly or masses felt. Normal bowel sounds heard. Central nervous system: Alert and oriented. No focal neurological deficits. Extremities: Symmetric 5 x 5 power. Skin: No rashes, lesions or ulcers Psychiatry: Judgement and insight appear normal. Mood & affect appropriate.  Diffuse bilateral thyromegaly.   Data Reviewed: I have personally reviewed following labs and imaging studies  CBC: Recent Labs  Lab 09/18/20 1114 09/19/20 0414  WBC 10.3 8.4  HGB 14.4 11.1*  HCT 41.1 32.2*  MCV 90.7 90.7  PLT 173 [id number]   Basic Metabolic Panel: Recent Labs  Lab 09/18/20 1114 09/19/20 0414  NA 135 138  K 3.4* 2.8*  CL 99 107  CO2 19* 21*  GLUCOSE 133* 98  BUN 6* <5*  CREATININE 0.79 0.50  CALCIUM 9.2 8.3*  MG  --  1.7   GFR: Estimated Creatinine Clearance: 61.7 mL/min (by C-G formula based on SCr of 0.5 mg/dL). Liver Function Tests: Recent Labs  Lab 09/18/20 1114 09/19/20 0414  AST 82* 41  ALT 224* 132*  ALKPHOS 192* 140*  BILITOT 1.6* 1.1  PROT 8.2* 6.2*  ALBUMIN 3.8 3.0*   Recent Labs  Lab 09/18/20 1114  LIPASE 27   No results for input(s): AMMONIA in the last 168 hours. Coagulation Profile: No results for input(s): INR, PROTIME in the last 168 hours. Cardiac Enzymes: No results for input(s): CKTOTAL, CKMB, CKMBINDEX, TROPONINI in the last 168 hours. BNP (last 3 results) No results for input(s): PROBNP in the last 8760 hours. HbA1C: No results for input(s): HGBA1C in the last 72 hours. CBG: No results for input(s): GLUCAP in the last 168 hours. Lipid Profile: No results for input(s): CHOL, HDL, LDLCALC, TRIG, CHOLHDL, LDLDIRECT in the last 72 hours. Thyroid Function Tests: Recent Labs    09/19/20 0414  TSH 3.700  FREET4 1.58*   Anemia Panel: No results for input(s): VITAMINB12, FOLATE, FERRITIN, TIBC, IRON, RETICCTPCT in the last 72 hours. Sepsis Labs: No results for  input(s): PROCALCITON, LATICACIDVEN in the last 168 hours.  Recent Results (from the past 240 hour(s))  Resp Panel by RT-PCR (Flu A&B, Covid) Nasopharyngeal Swab     Status: None   Collection Time: 09/18/20  5:40 PM   Specimen: Nasopharyngeal Swab; Nasopharyngeal(NP) swabs in vial transport medium  Result Value Ref Range Status   SARS Coronavirus 2 by RT PCR NEGATIVE NEGATIVE Final    Comment: (NOTE) SARS-CoV-2 target nucleic acids are NOT DETECTED.  The SARS-CoV-2 RNA is generally detectable in upper respiratory specimens during the acute phase of infection. The lowest concentration of SARS-CoV-2 viral copies this assay can detect is 138 copies/mL. A negative result does not preclude SARS-Cov-2 infection and should not be used as the sole basis for treatment or other patient management decisions. A negative result may occur with  improper specimen collection/handling, submission of specimen other than nasopharyngeal swab, presence  of viral mutation(s) within the areas targeted by this assay, and inadequate number of viral copies(<138 copies/mL). A negative result must be combined with clinical observations, patient history, and epidemiological information. The expected result is Negative.  Fact Sheet for Patients:  EntrepreneurPulse.com.au  Fact Sheet for Healthcare Providers:  IncredibleEmployment.be  This test is no t yet approved or cleared by the [place] FDA and  has been authorized for detection and/or diagnosis of SARS-CoV-2 by FDA under an Emergency Use Authorization (EUA). This EUA will remain  in effect (meaning this test can be used) for the duration of the COVID-19 declaration under Section 564(b)(1) of the Act, 21 U.S.C.section 360bbb-3(b)(1), unless the authorization is terminated  or revoked sooner.       Influenza A by PCR NEGATIVE NEGATIVE Final   Influenza B by PCR NEGATIVE NEGATIVE Final    Comment: (NOTE) The  Xpert Xpress SARS-CoV-2/FLU/RSV plus assay is intended as an aid in the diagnosis of influenza from Nasopharyngeal swab specimens and should not be used as a sole basis for treatment. Nasal washings and aspirates are unacceptable for Xpert Xpress SARS-CoV-2/FLU/RSV testing.  Fact Sheet for Patients: EntrepreneurPulse.com.au  Fact Sheet for Healthcare Providers: IncredibleEmployment.be  This test is not yet approved or cleared by the [place] FDA and has been authorized for detection and/or diagnosis of SARS-CoV-2 by FDA under an Emergency Use Authorization (EUA). This EUA will remain in effect (meaning this test can be used) for the duration of the COVID-19 declaration under Section 564(b)(1) of the Act, 21 U.S.C. section 360bbb-3(b)(1), unless the authorization is terminated or revoked.  Performed at [facility], [address] [place], [place] [address]          Radiology Studies: CT ABDOMEN PELVIS W CONTRAST  Result Date: 09/18/2020 CLINICAL DATA:  Abdominal distension, choledochal lithiasis seen on prior MRCP. EXAM: CT ABDOMEN AND PELVIS WITH CONTRAST TECHNIQUE: Multidetector CT imaging of the abdomen and pelvis was performed using the standard protocol following bolus administration of intravenous contrast. CONTRAST:  193mL OMNIPAQUE IOHEXOL 300 MG/ML  SOLN COMPARISON:  MRCP dated 09/18/2020. FINDINGS: Lower chest: No acute abnormality. Hepatobiliary: A hypoattenuating lesion in the left hepatic lobe measures 1.7 cm and demonstrated internal debris and rim enhancement on same day MRCP (series 2, image 20). A 9 mm benign cyst is seen in the right hepatic lobe (series 2, image 22). A few additional lesions identified on MRCP are less well seen on this exam. No gallbladder wall thickening. The common bile duct is mildly dilated, measuring up to 6 mm. The patient's known cholelithiasis and choledocholithiasis is not well seen on  this exam. Pancreas: Unremarkable. No pancreatic ductal dilatation or surrounding inflammatory changes. Spleen: A 4 mm hypoattenuating lesion in the spleen is consistent with a benign cyst. Adrenals/Urinary Tract: Adrenal glands are unremarkable. Bilateral renal cysts measure up to 7 mm. Nonobstructive renal calculi measure 2 mm, one in each kidney. No hydronephrosis. Bladder is unremarkable. Stomach/Bowel: Stomach is within normal limits. No pericecal inflammatory changes are noted to suggest acute appendicitis. No evidence of bowel wall thickening, distention, or inflammatory changes. Vascular/Lymphatic: No significant vascular findings are present. No enlarged abdominal or pelvic lymph nodes. Reproductive: Status post hysterectomy. No adnexal masses. Other: No abdominal wall hernia or abnormality. No abdominopelvic ascites. Musculoskeletal: Degenerative changes are seen in the spine. The patient is status post internal fixation of the proximal left femur. There is a chronic appearing compression deformity of L1. IMPRESSION: 1. Known cholelithiasis and choledocholithiasis better  characterized on same day MRCP. The common bile duct is mildly dilated, measuring up to 6 mm. 2. Multiple hepatic lesions are better characterized on same day MRCP. Aortic Atherosclerosis (ICD10-I70.0). Electronically Signed   By: Zerita Boers M.D.   On: 09/18/2020 18:42   MR 3D Recon At Scanner  Result Date: 09/18/2020 CLINICAL DATA:  Nausea vomiting and diarrhea with laboratory signs of biliary obstruction. EXAM: MRI ABDOMEN WITHOUT AND WITH CONTRAST (INCLUDING MRCP) TECHNIQUE: Multiplanar multisequence MR imaging of the abdomen was performed both before and after the administration of intravenous contrast. Heavily T2-weighted images of the biliary and pancreatic ducts were obtained, and three-dimensional MRCP images were rendered by post processing. CONTRAST:  15mL GADAVIST GADOBUTROL 1 MMOL/ML IV SOLN COMPARISON:  Same day  ultrasound. FINDINGS: Lower chest: No acute findings. Hepatobiliary: No hepatic steatosis. There are at least 3 cystic rim enhancing lesions in the liver the largest of which is in the anterior left lobe of the liver measuring 1.7 cm on image 36/17 with that cysts demonstrating some internal debris, a smaller rim enhancing cystic lesion is visualized in the right lobe of the liver measuring 6 mm on image 34/22 with a similar appearing smaller 3 mm lesion in the lateral left lobe of the liver on image 33/22. There is a wedge-shaped area of increased arterial enhancement along the falciform ligament which surrounds the larger lesion as well as some increased T2 signal in this area suggestive of hepatic edema. There are 2 nonenhancing simple cyst in the right lobe of the liver the largest of which measures 9 mm. Cholelithiasis measuring up to 5 mm without pericholecystic fluid or gallbladder wall thickening. There is mild intra and extrahepatic biliary ductal dilation with the common duct measuring 7 mm on image 10/10. There are 2 small choledocholithiasis in the distal common duct measuring up to 4 mm on image 16/3. There is no peribiliary enhancement. Pancreas: No mass, inflammatory changes, or other parenchymal abnormality identified. Spleen: There is a nonenhancing 4 mm cystic lesion in the spleen, likely a benign cyst or lymphangioma. Adrenals/Urinary Tract: Bilateral adrenal glands are unremarkable. No hydronephrosis. Small bilateral cysts measuring up to 7 mm in the left kidney. No solid enhancing renal lesions. Stomach/Bowel: Visualized portions within the abdomen are unremarkable. Vascular/Lymphatic: Aortic atherosclerosis without aneurysmal dilation. Other:  None. Musculoskeletal: No suspicious bone lesions identified. IMPRESSION: 1. There is mild intra and extrahepatic biliary ductal dilation with 2 small choledocholithiasis in the distal common duct measuring up to 4 mm. No peribiliary enhancement. 2. Rim  enhancing cystic hepatic lesions measuring up to 1.7 cm as well as 2 smaller subcentimeter lesions, with this larger lesion demonstrating some internal complexity with associated hepatic edema, findings which are nonspecific but suspicious for hepatic abscess/micro abscesses within alternate diagnosis including biliary cystadenoma. Recommend correlation with laboratory values for signs of infection. There are no MRI findings to suggest ascending cholangitis, as such recommend CT abdomen and pelvis to assess for a non biliary source of infection if continued clinical concern for infection. 3. Cholelithiasis without evidence of acute cholecystitis. These results were called by telephone at the time of interpretation on 09/18/2020 at 5:19 pm to provider Dr. Nickolas Madrid, who verbally acknowledged these results. Electronically Signed   By: Dahlia Bailiff MD   On: 09/18/2020 17:21   MR ABDOMEN MRCP W WO CONTAST  Result Date: 09/18/2020 CLINICAL DATA:  Nausea vomiting and diarrhea with laboratory signs of biliary obstruction. EXAM: MRI ABDOMEN WITHOUT AND WITH CONTRAST (INCLUDING MRCP) TECHNIQUE:  Multiplanar multisequence MR imaging of the abdomen was performed both before and after the administration of intravenous contrast. Heavily T2-weighted images of the biliary and pancreatic ducts were obtained, and three-dimensional MRCP images were rendered by post processing. CONTRAST:  29mL GADAVIST GADOBUTROL 1 MMOL/ML IV SOLN COMPARISON:  Same day ultrasound. FINDINGS: Lower chest: No acute findings. Hepatobiliary: No hepatic steatosis. There are at least 3 cystic rim enhancing lesions in the liver the largest of which is in the anterior left lobe of the liver measuring 1.7 cm on image 36/17 with that cysts demonstrating some internal debris, a smaller rim enhancing cystic lesion is visualized in the right lobe of the liver measuring 6 mm on image 34/22 with a similar appearing smaller 3 mm lesion in the lateral left lobe of the  liver on image 33/22. There is a wedge-shaped area of increased arterial enhancement along the falciform ligament which surrounds the larger lesion as well as some increased T2 signal in this area suggestive of hepatic edema. There are 2 nonenhancing simple cyst in the right lobe of the liver the largest of which measures 9 mm. Cholelithiasis measuring up to 5 mm without pericholecystic fluid or gallbladder wall thickening. There is mild intra and extrahepatic biliary ductal dilation with the common duct measuring 7 mm on image 10/10. There are 2 small choledocholithiasis in the distal common duct measuring up to 4 mm on image 16/3. There is no peribiliary enhancement. Pancreas: No mass, inflammatory changes, or other parenchymal abnormality identified. Spleen: There is a nonenhancing 4 mm cystic lesion in the spleen, likely a benign cyst or lymphangioma. Adrenals/Urinary Tract: Bilateral adrenal glands are unremarkable. No hydronephrosis. Small bilateral cysts measuring up to 7 mm in the left kidney. No solid enhancing renal lesions. Stomach/Bowel: Visualized portions within the abdomen are unremarkable. Vascular/Lymphatic: Aortic atherosclerosis without aneurysmal dilation. Other:  None. Musculoskeletal: No suspicious bone lesions identified. IMPRESSION: 1. There is mild intra and extrahepatic biliary ductal dilation with 2 small choledocholithiasis in the distal common duct measuring up to 4 mm. No peribiliary enhancement. 2. Rim enhancing cystic hepatic lesions measuring up to 1.7 cm as well as 2 smaller subcentimeter lesions, with this larger lesion demonstrating some internal complexity with associated hepatic edema, findings which are nonspecific but suspicious for hepatic abscess/micro abscesses within alternate diagnosis including biliary cystadenoma. Recommend correlation with laboratory values for signs of infection. There are no MRI findings to suggest ascending cholangitis, as such recommend CT abdomen  and pelvis to assess for a non biliary source of infection if continued clinical concern for infection. 3. Cholelithiasis without evidence of acute cholecystitis. These results were called by telephone at the time of interpretation on 09/18/2020 at 5:19 pm to provider Dr. Nickolas Madrid, who verbally acknowledged these results. Electronically Signed   By: Dahlia Bailiff MD   On: 09/18/2020 17:21   US ABDOMEN LIMITED RUQ (LIVER/GB)  Result Date: 09/18/2020 CLINICAL DATA:  Right upper quadrant pain for 2 days, elevated liver function tests. EXAM: ULTRASOUND ABDOMEN LIMITED RIGHT UPPER QUADRANT COMPARISON:  None. FINDINGS: Gallbladder: A mobile 1.1 cm gallstone is noted. No wall thickening visualized. No sonographic Murphy sign noted by sonographer. Common bile duct: Diameter: 3 mm Liver: A 1 cm cyst is seen in the right hepatic lobe. Within normal limits in parenchymal echogenicity. Portal vein is patent on color Doppler imaging with normal direction of blood flow towards the liver. Other: None. IMPRESSION: Cholelithiasis without evidence of acute cholecystitis. Electronically Signed   By: Harley Hallmark.D.  On: 09/18/2020 12:56        Scheduled Meds: . atorvastatin  40 mg Oral QHS  . enoxaparin (LOVENOX) injection  40 mg Subcutaneous Q24H  . famotidine  40 mg Oral Daily  . metoprolol succinate  25 mg Oral Daily  . pantoprazole  40 mg Oral Daily  . potassium chloride  20 mEq Oral BID  . Vilazodone HCl  40 mg Oral Daily   Continuous Infusions: . cefTRIAXone (ROCEPHIN)  IV    . potassium chloride 10 mEq (09/19/20 0843)     LOS: 1 day    Time spent: 33 minutes    Sharen Hones, MD [facility]   To contact the attending provider between 7A-7P or the covering provider during after hours 7P-7A, please log into the web site [email or web address] and access using universal [place] password for that web site. If you do not have the password, please call the hospital operator.  09/19/2020, 9:30 AM

## 2020-09-19 NOTE — Progress Notes (Signed)
Pt transferred to room 223, report called to the receiving RN.

## 2020-09-20 ENCOUNTER — Encounter: Payer: Self-pay | Admitting: Family Medicine

## 2020-09-20 ENCOUNTER — Inpatient Hospital Stay: Payer: Medicare Other | Admitting: Anesthesiology

## 2020-09-20 ENCOUNTER — Encounter
Admission: EM | Disposition: A | Discharge status: Home / Self Care | Payer: Self-pay | Source: Home / Self Care | Attending: Internal Medicine

## 2020-09-20 ENCOUNTER — Inpatient Hospital Stay: Payer: Medicare Other

## 2020-09-20 DIAGNOSIS — K8050 Calculus of bile duct without cholangitis or cholecystitis without obstruction: Secondary | ICD-10-CM | POA: Diagnosis not present

## 2020-09-20 DIAGNOSIS — K8060 Calculus of gallbladder and bile duct with cholecystitis, unspecified, without obstruction: Secondary | ICD-10-CM

## 2020-09-20 HISTORY — PX: ENDOSCOPIC RETROGRADE CHOLANGIOPANCREATOGRAPHY (ERCP) WITH PROPOFOL: SHX5810

## 2020-09-20 LAB — COMPREHENSIVE METABOLIC PANEL
ALT: 105 U/L — ABNORMAL HIGH (ref 0–44)
AST: 32 U/L (ref 15–41)
Albumin: 3.2 g/dL — ABNORMAL LOW (ref 3.5–5.0)
Alkaline Phosphatase: 122 U/L (ref 38–126)
Anion gap: 9 (ref 5–15)
BUN: 5 mg/dL — ABNORMAL LOW (ref 8–23)
CO2: 21 mmol/L — ABNORMAL LOW (ref 22–32)
Calcium: 8.7 mg/dL — ABNORMAL LOW (ref 8.9–10.3)
Chloride: 110 mmol/L (ref 98–111)
Creatinine, Ser: 0.6 mg/dL (ref 0.44–1.00)
GFR, Estimated: >60 mL/min (ref >60)
Glucose, Bld: 133 mg/dL — ABNORMAL HIGH (ref 70–99)
Potassium: 3.4 mmol/L — ABNORMAL LOW (ref 3.5–5.1)
Sodium: 140 mmol/L (ref 135–145)
Total Bilirubin: 1 mg/dL (ref 0.3–1.2)
Total Protein: 7 g/dL (ref 6.5–8.1)

## 2020-09-20 LAB — CBC WITH DIFFERENTIAL/PLATELET
Abs Immature Granulocytes: 0.02 10*3/uL (ref 0.00–0.07)
Basophils Absolute: 0 10*3/uL (ref 0.0–0.1)
Basophils Relative: 0 %
Eosinophils Absolute: 0.1 10*3/uL (ref 0.0–0.5)
Eosinophils Relative: 2 %
HCT: 34 % — ABNORMAL LOW (ref 36.0–46.0)
Hemoglobin: 12 g/dL (ref 12.0–15.0)
Immature Granulocytes: 0 %
Lymphocytes Relative: 23 %
Lymphs Abs: 1.7 10*3/uL (ref 0.7–4.0)
MCH: 32.4 pg (ref 26.0–34.0)
MCHC: 35.3 g/dL (ref 30.0–36.0)
MCV: 91.9 fL (ref 80.0–100.0)
Monocytes Absolute: 0.7 10*3/uL (ref 0.1–1.0)
Monocytes Relative: 10 %
Neutro Abs: 4.5 10*3/uL (ref 1.7–7.7)
Neutrophils Relative %: 65 %
Platelets: 180 10*3/uL (ref 150–400)
RBC: 3.7 MIL/uL — ABNORMAL LOW (ref 3.87–5.11)
RDW: 12.4 % (ref 11.5–15.5)
WBC: 7.1 10*3/uL (ref 4.0–10.5)
nRBC: 0 % (ref 0.0–0.2)

## 2020-09-20 LAB — MAGNESIUM: Magnesium: 1.8 mg/dL (ref 1.7–2.4)

## 2020-09-20 SURGERY — ENDOSCOPIC RETROGRADE CHOLANGIOPANCREATOGRAPHY (ERCP) WITH PROPOFOL
Anesthesia: General

## 2020-09-20 MED ORDER — CEFAZOLIN SODIUM-DEXTROSE 2-4 GM/100ML-% IV SOLN
2.0000 g | INTRAVENOUS | Status: AC
  Administered 2020-09-21: 2 g via INTRAVENOUS
  Filled 2020-09-20 (×2): qty 100

## 2020-09-20 MED ORDER — ONDANSETRON HCL 4 MG/2ML IJ SOLN
4.0000 mg | Freq: Once | INTRAMUSCULAR | Status: DC | PRN
Start: 2020-09-20 — End: 2020-09-20

## 2020-09-20 MED ORDER — PHENYLEPHRINE HCL (PRESSORS) 10 MG/ML IV SOLN
INTRAVENOUS | Status: DC | PRN
  Administered 2020-09-20: 100 ug via INTRAVENOUS
  Administered 2020-09-20: 200 ug via INTRAVENOUS

## 2020-09-20 MED ORDER — ONDANSETRON HCL 4 MG/2ML IJ SOLN
INTRAMUSCULAR | Status: DC | PRN
  Administered 2020-09-20: 4 mg via INTRAVENOUS

## 2020-09-20 MED ORDER — FENTANYL CITRATE (PF) 100 MCG/2ML IJ SOLN
INTRAMUSCULAR | Status: DC | PRN
  Administered 2020-09-20: 50 ug via INTRAVENOUS

## 2020-09-20 MED ORDER — ONDANSETRON HCL 4 MG/2ML IJ SOLN
INTRAMUSCULAR | Status: AC
  Filled 2020-09-20: qty 2

## 2020-09-20 MED ORDER — FENTANYL CITRATE (PF) 100 MCG/2ML IJ SOLN: 25.0000 ug | INTRAMUSCULAR | Status: DC | PRN

## 2020-09-20 MED ORDER — SUCCINYLCHOLINE CHLORIDE 200 MG/10ML IV SOSY
PREFILLED_SYRINGE | INTRAVENOUS | Status: AC
  Filled 2020-09-20: qty 10

## 2020-09-20 MED ORDER — SODIUM CHLORIDE 0.9 % IV SOLN: INTRAVENOUS | Status: DC

## 2020-09-20 MED ORDER — LACTATED RINGERS IV SOLN: Freq: Once | INTRAVENOUS | Status: AC

## 2020-09-20 MED ORDER — DEXAMETHASONE SODIUM PHOSPHATE 10 MG/ML IJ SOLN
INTRAMUSCULAR | Status: DC | PRN
  Administered 2020-09-20: 5 mg via INTRAVENOUS

## 2020-09-20 MED ORDER — INDOMETHACIN 50 MG RE SUPP: 100.0000 mg | Freq: Once | RECTAL | Status: AC

## 2020-09-20 MED ORDER — FENTANYL CITRATE (PF) 100 MCG/2ML IJ SOLN
INTRAMUSCULAR | Status: AC
  Filled 2020-09-20: qty 2

## 2020-09-20 MED ORDER — INDOMETHACIN 50 MG RE SUPP: 100.0000 mg | Freq: Once | RECTAL | Status: DC

## 2020-09-20 MED ORDER — DEXAMETHASONE SODIUM PHOSPHATE 10 MG/ML IJ SOLN
INTRAMUSCULAR | Status: AC
  Filled 2020-09-20: qty 1

## 2020-09-20 MED ORDER — LIDOCAINE HCL (CARDIAC) PF 100 MG/5ML IV SOSY
PREFILLED_SYRINGE | INTRAVENOUS | Status: DC | PRN
  Administered 2020-09-20: 50 mg via INTRAVENOUS

## 2020-09-20 MED ORDER — SUCCINYLCHOLINE CHLORIDE 20 MG/ML IJ SOLN
INTRAMUSCULAR | Status: DC | PRN
  Administered 2020-09-20: 120 mg via INTRAVENOUS

## 2020-09-20 MED ORDER — INDOMETHACIN 50 MG RE SUPP
RECTAL | Status: AC
  Administered 2020-09-20: 100 mg via RECTAL
  Filled 2020-09-20: qty 2

## 2020-09-20 MED ORDER — INDOCYANINE GREEN 25 MG IV SOLR
2.5000 mg | INTRAVENOUS | Status: AC
  Administered 2020-09-21: 2.5 mg via INTRAVENOUS
  Filled 2020-09-20: qty 1

## 2020-09-20 MED ORDER — PROPOFOL 10 MG/ML IV BOLUS
INTRAVENOUS | Status: DC | PRN
  Administered 2020-09-20: 120 mg via INTRAVENOUS

## 2020-09-20 NOTE — Progress Notes (Signed)
MD okay with tele being discontinued

## 2020-09-20 NOTE — Progress Notes (Signed)
[facility] SURGICAL PROGRESS NOTE (cpt [phone])  Hospital Day(s): 2.  Interval History: Patient seen and examined, no acute events or new complaints overnight. Patient reports she feels a little better this morning, still with upper abdominal discomfort but overall improved. She denies fever, chills, nausea, emesis. She remains without leukocytosis with WBC at 7.1K. Her renal function remains normal with sCr - 0.60. she continues to have mild hypokalemia to 3.4. Hyperbilirubinemia remains normal with morning to 1.0. She is currently NPO. Plan for ERCP later this afternoon.   Review of Systems:  Constitutional: denies fever, chills  HEENT: denies cough or congestion  Respiratory: denies any shortness of breath  Cardiovascular: denies chest pain or palpitations  Gastrointestinal: + abdominal pain (improved), denied N/V, or diarrhea/and bowel function as per interval history Genitourinary: denies burning with urination or urinary frequency  Vital signs in last 24 hours: [min-max] current  Temp:  [97.9 F (36.6 C)-99 F (37.2 C)] 98 F (36.7 C) (04/25 0430) Pulse Rate:  [93-110] 93 (04/25 0430) Resp:  [16-18] 18 (04/25 0430) BP: (102-122)/(74-94) 115/74 (04/25 0430) SpO2:  [97 %-99 %] 98 % (04/25 0430)     Height: 5\' 1"  (154.9 cm) Weight: 65.8 kg BMI (Calculated): 27.41   Intake/Output last 2 shifts:  04/24 0701 - 04/25 0700 In: 580 [P.O.:480; IV Piggyback:100] Out: -    Physical Exam:  Constitutional: alert, cooperative and no distress  HENT: normocephalic without obvious abnormality  Eyes: PERRL, EOM's grossly intact and symmetric, no scleral icterus Respiratory: breathing non-labored at rest  Cardiovascular: regular rate and sinus rhythm  Gastrointestinal: Soft, non-tender, and non-distended, no rebound/guarding Musculoskeletal: no edema or wounds, motor and sensation grossly intact, NT    Labs:  CBC Latest Ref Rng & Units 09/20/2020 09/19/2020 09/18/2020  WBC  4.0 - 10.5 K/uL 7.1 8.4 10.3  Hemoglobin 12.0 - 15.0 g/dL 12.0 11.1(L) 14.4  Hematocrit 36.0 - 46.0 % 34.0(L) 32.2(L) 41.1  Platelets 150 - 400 K/uL 180 150 173   CMP Latest Ref Rng & Units 09/20/2020 09/19/2020 09/19/2020  Glucose 70 - 99 mg/dL 133(H) - 98  BUN 8 - 23 mg/dL 5(L) - <5(L)  Creatinine 0.44 - 1.00 mg/dL 0.60 - 0.50  Sodium 135 - 145 mmol/L 140 - 138  Potassium 3.5 - 5.1 mmol/L 3.4(L) 3.8 2.8(L)  Chloride 98 - 111 mmol/L 110 - 107  CO2 22 - 32 mmol/L 21(L) - 21(L)  Calcium 8.9 - 10.3 mg/dL 8.7(L) - 8.3(L)  Total Protein 6.5 - 8.1 g/dL 7.0 - 6.2(L)  Total Bilirubin 0.3 - 1.2 mg/dL 1.0 - 1.1  Alkaline Phos 38 - 126 U/L 122 - 140(H)  AST 15 - 41 U/L 32 - 41  ALT 0 - 44 U/L 105(H) - 132(H)    Imaging studies: No new pertinent imaging studies   Assessment/Plan: (ICD-10's: K35.50) 65 y.o. female admitted with choledocholithiasis with cholecystitsi   - Plan for ERCP today with GI; appreciate their assistance  - NPO for procedure; should be okay for CLD after ERCP; NPO at midnight   - Tentative plan for robotic assisted laparoscopic cholecystectomy tomorrow (04/26) with Dr Hampton Abbot pending OR/Anesthesia availability    - Monitor abdominal examination  - Pain control prn; antiemetics prn  - Mobilization as tolerated    - further management per primary service; we will follow    All of the above findings and recommendations were discussed with the patient, and the medical team, and all of patient's questions were answered to her  expressed satisfaction.  -- Edison Simon, PA-C [facility] 09/20/2020, 9:24 AM [phone] M-F: 7am - 4pm

## 2020-09-20 NOTE — Anesthesia Preprocedure Evaluation (Addendum)
Anesthesia Evaluation  Patient identified by MRN, date of birth, ID band Patient awake    Reviewed: Allergy & Precautions, NPO status , Patient's Chart, lab work & pertinent test results  History of Anesthesia Complications Negative for: history of anesthetic complications  Airway Mallampati: III  TM Distance: <3 FB Neck ROM: Full    Dental  (+) Chipped, Poor Dentition, Missing   Pulmonary COPD, former smoker,    Pulmonary exam normal breath sounds clear to auscultation- rhonchi (-) wheezing      Cardiovascular hypertension, Pt. on medications (-) CAD, (-) Past MI, (-) Cardiac Stents and (-) CABG Normal cardiovascular exam Rhythm:Regular Rate:Normal - Systolic murmurs and - Diastolic murmurs    Neuro/Psych  Headaches, neg Seizures PSYCHIATRIC DISORDERS Anxiety Depression  Neuromuscular disease    GI/Hepatic Neg liver ROS, GERD  ,  Endo/Other  negative endocrine ROSneg diabetes  Renal/GU negative Renal ROSstones     Musculoskeletal  (+) Arthritis , Osteoarthritis,    Abdominal Normal abdominal exam  (+) - obese,   Peds  Hematology negative hematology ROS (+)   Anesthesia Other Findings Past Medical History: No date: Anxiety 05/03/2016: Basal cell carcinoma     Comment:  Left deltoid. Superficial. 05/03/2016: Basal cell carcinoma     Comment:  Left distal lat. tricep. Nodular. No date: Borderline diabetes 06/25/2010: BRONCHITIS, ACUTE WITH BRONCHOSPASM     Comment:  Qualifier: Diagnosis of  By: Alveta Heimlich MD, Cornelia Copa   10/18/2015: Chronic prescription benzodiazepine use No date: Depression No date: Dysrhythmia     Comment:  tachycardia 05/14/2009: GASTROENTERITIS WITHOUT DEHYDRATION     Comment:  Qualifier: Diagnosis of  By: Deborra Medina MD, Talia   No date: GERD (gastroesophageal reflux disease) No date: 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/2016: Hip fx, left, closed, with nonunion, subsequent encounter No date: History of kidney stones No date:  Hyperlipidemia No date: Hypertension No date: Irregular heart beat No date: Pre-diabetes 04/11/2017: Sinus tachycardia 08/01/2018: Skin cancer of face 10/18/2015: 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               Dossie Arbour Serial number: [id number] Model number: [id number]    Reproductive/Obstetrics                           Anesthesia Physical Anesthesia Plan  ASA: III  Anesthesia Plan: General   Post-op Pain Management:    Induction: Intravenous  PONV Risk Score and Plan: 2 and Ondansetron, Dexamethasone and Midazolam  Airway Management Planned: Oral ETT  Additional Equipment:   Intra-op Plan:   Post-operative Plan: Extubation in OR  Informed Consent: I have reviewed the patients History and Physical, chart, labs and discussed the procedure including the risks, benefits and alternatives for the proposed anesthesia with the patient or authorized representative who has indicated his/her understanding and acceptance.     Dental advisory given  Plan Discussed with: Surgeon  Anesthesia Plan Comments:        Anesthesia Quick Evaluation                                  Anesthesia Evaluation  Patient identified by MRN, date of birth, ID band Patient awake    Reviewed: Allergy & Precautions, H&P , NPO status , Patient's Chart, lab work & pertinent test results  Airway Mallampati: III  TM Distance: <  3 FB Neck ROM: limited    Dental  (+) Chipped, Poor Dentition, Missing, Partial Lower, Upper Dentures   Pulmonary neg shortness of breath, former smoker,    Pulmonary exam normal        Cardiovascular Exercise Tolerance: Good hypertension, (-) angina(-) DOE Normal cardiovascular exam+ dysrhythmias      Neuro/Psych  Headaches, PSYCHIATRIC DISORDERS Anxiety Depression  Neuromuscular disease    GI/Hepatic Neg liver ROS, GERD  Medicated and Controlled,   Endo/Other  negative endocrine ROS  Renal/GU      Musculoskeletal  (+) Arthritis , Osteoarthritis,    Abdominal   Peds  Hematology negative hematology ROS (+)   Anesthesia Other Findings Past Medical History: No date: Anxiety No date: Borderline diabetes 06/25/2010: BRONCHITIS, ACUTE WITH BRONCHOSPASM     Comment:  Qualifier: Diagnosis of  By: Alveta Heimlich MD, Cornelia Copa   10/18/2015: Chronic prescription benzodiazepine use No date: Depression No date: Dysrhythmia     Comment:  tachycardia 05/14/2009: GASTROENTERITIS WITHOUT DEHYDRATION     Comment:  Qualifier: Diagnosis of  By: Deborra Medina MD, Talia   No date: GERD (gastroesophageal reflux disease) No date: 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/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               Dossie Arbour Serial number: [id number] Model number: [id number]   Past Surgical History: No date: ABDOMINAL HYSTERECTOMY No date: APPENDECTOMY No date: BACK SURGERY     Comment:  X 3 No date: BREAST EXCISIONAL BIOPSY; Right     Comment:  surgical bx age 61  08/28/2013: COLONOSCOPY; N/A     Comment:  Procedure: COLONOSCOPY;  Surgeon: Danie Binder, MD;                Location: [facility];  Service: Endoscopy;                Laterality: N/A;  8:30 AM 05/30/2016: INTRAMEDULLARY (IM) NAIL INTERTROCHANTERIC; Left     Comment:  Procedure: INTRAMEDULLARY (IM) NAIL INTERTROCHANTRIC;                Surgeon: Thornton Park, MD;  Location: [facility] ORS;                Service: Orthopedics;  Laterality: Left; No date: SHOULDER SURGERY; Right No date: Spinal cord stimulator     Reproductive/Obstetrics negative OB ROS                              Anesthesia Physical  Anesthesia Plan  ASA: III  Anesthesia Plan: General   Post-op Pain Management:    Induction: Intravenous  PONV Risk Score and Plan:   Airway Management Planned: Oral ETT  Additional Equipment:   Intra-op Plan:   Post-operative Plan: Extubation in OR  Informed Consent: I have reviewed the patients History and Physical, chart, labs and discussed the procedure including the risks, benefits and alternatives for the proposed anesthesia with the patient or authorized representative who has indicated his/her understanding and acceptance.     Dental Advisory Given  Plan Discussed with: Anesthesiologist, CRNA and Surgeon  Anesthesia Plan Comments: (Patient consented for risks of anesthesia including but not  limited to:  - adverse reactions to medications - risk of intubation if required - damage to eyes, teeth, lips or other oral mucosa - nerve damage due to positioning  - sore throat or hoarseness - Damage to heart, brain, nerves, lungs, other parts of body or loss of life  Patient voiced understanding.)        Anesthesia Quick Evaluation

## 2020-09-20 NOTE — Op Note (Signed)
[facility] Patient Name: Wanda [name] Procedure Date: 09/20/2020 11:28 AM MRN: [id number] Account #: [id number] Date of Birth: Apr 25, 1956 Admit Type: Inpatient Age: 65 Room: [facility] ENDO ROOM 4 Gender: Female Note Status: Finalized Procedure:             ERCP Indications:           Common bile duct stone(s) Providers:             Lucilla Lame MD, MD Referring MD:          Ane Payment, MD (Referring MD) Medicines:             General Anesthesia Complications:         No immediate complications. Procedure:             Pre-Anesthesia Assessment:                        - Prior to the procedure, a History and Physical was                         performed, and patient medications and allergies were                         reviewed. The patient's tolerance of previous                         anesthesia was also reviewed. The risks and benefits                         of the procedure and the sedation options and risks                         were discussed with the patient. All questions were                         answered, and informed consent was obtained. Prior                         Anticoagulants: The patient has taken no previous                         anticoagulant or antiplatelet agents. ASA Grade                         Assessment: II - A patient with mild systemic disease.                         After reviewing the risks and benefits, the patient                         was deemed in satisfactory condition to undergo the                         procedure.                        After obtaining informed consent, the scope was passed  under direct vision. Throughout the procedure, the                         patient's blood pressure, pulse, and oxygen                         saturations were monitored continuously. The [employer] D single use duodenoscope was                          introduced through the mouth, and used to inject                         contrast into and used to inject contrast into the                         bile duct. The ERCP was accomplished without                         difficulty. The patient tolerated the procedure well. Findings:      The scout film was normal. The major papilla was located entirely within       a diverticulum. The bile duct was deeply cannulated with the short-nosed       traction sphincterotome. Contrast was injected. I personally interpreted       the bile duct images. There was brisk flow of contrast through the       ducts. Image quality was excellent. Contrast extended to the entire       biliary tree. The lower third of the main bile duct contained filling       defect(s) thought to be a stone. A wire was passed into the biliary       tree. A 7 mm biliary sphincterotomy was made with a traction (standard)       sphincterotome using ERBE electrocautery. There was no       post-sphincterotomy bleeding. The biliary tree was swept with a 15 mm       balloon starting at the bifurcation. Two stones were removed. No stones       remained. Impression:            - The major papilla was located entirely within a                         diverticulum.                        - A filling defect consistent with a stone was seen on                         the cholangiogram.                        - Choledocholithiasis was found. Complete removal was                         accomplished by biliary sphincterotomy and balloon  extraction.                        - A biliary sphincterotomy was performed.                        - The biliary tree was swept. Recommendation:        - Return patient to hospital ward for ongoing care.                        - Continue present medications.                        - Watch for pancreatitis, bleeding, perforation, and                         cholangitis.                         - Clear liquid diet today. Procedure Code(s):     --- Professional ---                        [id number], Endoscopic retrograde cholangiopancreatography                         (ERCP); with removal of calculi/debris from                         biliary/pancreatic duct(s)                        43262, Endoscopic retrograde cholangiopancreatography                         (ERCP); with sphincterotomy/papillotomy                        [phone], Endoscopic catheterization of the biliary                         ductal system, radiological supervision and                         interpretation Diagnosis Code(s):     --- Professional ---                        K80.50, Calculus of bile duct without cholangitis or                         cholecystitis without obstruction                        R93.2, Abnormal findings on diagnostic imaging of                         liver and biliary tract CPT copyright 2019 [employer]. All rights reserved. The codes documented in this report are preliminary and upon coder review may  be revised to meet current compliance requirements. Lucilla Lame MD, MD 09/20/2020 12:24:01 PM This report has been signed electronically. Number of Addenda: 0 Note Initiated On: 09/20/2020 11:28 AM Estimated Blood Loss:  Estimated blood  loss: none.      [facility]

## 2020-09-20 NOTE — Progress Notes (Signed)
PROGRESS NOTE    Wanda [name]  [id number] DOB: 1956/05/03 DOA: 09/18/2020 PCP: Barbaraann Boys, MD   Chief complaint.  Abdominal pain. Brief Narrative:  Wanda [name] is a 65 y.o. female with medical history significant for chronic pain, migraine, paroxysmal SVT, hypertension, hyperlipidemia, anxiety, depression, PTSD, prediabetes and GERD who presents with concerns of abdominal pain. Total bilirubin 1.6, MRCP was performed, showed a mild intra and extrahepatic biliary duct dilation and 4 mm stone in the distal CBD. Patient did not show any evidence of infection.  GI and general surgery was consulted for cholelithiasis.  ERCP today with sphincterotomy and removal of 2 stones. Going for robotic cholecystectomy tomorrow.   Assessment & Plan:   Principal Problem:   Choledocholithiasis Active Problems:   Hypokalemia   Anxiety and depression   Gastroesophageal reflux disease   Tachycardia   Essential hypertension   GAD (generalized anxiety disorder)   Acute lower UTI   Hepatic cyst  Choledocholithiasis without cholangitis. Patient has a mild elevation of liver enzyme without significant elevation of  Bilirubin, liver enzymes improved now. Abdominal pain is better. Patient had ERCP with GI today with sphincterotomy and removal of 2 stones, going for robotic cholecystectomy tomorrow with general surgery.   Thyromegaly/Sinus tachycardia/ PSVT. Patient has diffuse enlargement of the thyroid, she has been having tachycardia for a year, she had a Holter monitor recently, showed PSVT.  She also had intermittent diarrhea.  She also has significant anxiety.  Most likely she has hyperthyroidism.  However, her TSH is normal, free T4 is elevated.  I will also add a T3 and a free T3. If both T4 and T3 are elevated, patient need to treat for hyperthyroidism. -Continue with metoprolol.  UTI.  Urine cultures growing Enterobacter aerugenes-pending susceptibility -Continue ceftriaxone-we will  de-escalate once susceptibility results are available.  Hepatic cyst. MRCP suspect patient may have a liver abscess.  However, CT scan and a right upper quadrant ultrasound appear to be a simple cyst. Follow.  Hypokalemia.  Potassium still borderline low despite getting some replacement yesterday. -Replace potassium and monitor.  DVT prophylaxis: SCDs Code Status: Full Family Communication: Discussed with patient. Disposition Plan:  .   Status is: Inpatient  Remains inpatient appropriate because:Inpatient level of care appropriate due to severity of illness   Dispo: The patient is from: Home              Anticipated d/c is to: Home              Patient currently is not medically stable to d/c.   Difficult to place patient No    I/O last 3 completed shifts: In: 35 [P.O.:720; IV Piggyback:100] Out: -  Total I/O In: 450 [I.V.:450] Out: -     Consultants:   GI, General surgery  Procedures: Pending  Antimicrobials: Rocephin  Subjective: Per patient abdominal pain is improving, no nausea or vomiting.  She was waiting for ERCP when seen today.  Objective: Vitals:   09/20/20 1229 09/20/20 1230 09/20/20 1245 09/20/20 1306  BP: 116/81 114/78 121/78 (!) 131/92  Pulse:  91 81 85  Resp: 20 (!) 22 13   Temp:  (!) 97 F (36.1 C) (!) 97.1 F (36.2 C) 98.1 F (36.7 C)  TempSrc:    Oral  SpO2: 100% 100% 100% 100%  Weight:      Height:        Intake/Output Summary (Last 24 hours) at 09/20/2020 1423 Last data filed at 09/20/2020 1227 Gross per  24 hour  Intake 670 ml  Output --  Net 670 ml   Filed Weights   09/18/20 1107  Weight: 65.8 kg    Examination:  General.  Well-developed lady, in no acute distress. Pulmonary.  Lungs clear bilaterally, normal respiratory effort. CV.  Regular rate and rhythm, no JVD, rub or murmur. Abdomen.  Soft, nontender, nondistended, BS positive. CNS.  Alert and oriented x3.  No focal neurologic deficit. Extremities.  No edema,  no cyanosis, pulses intact and symmetrical. Psychiatry.  Judgment and insight appears normal.  Data Reviewed: I have personally reviewed following labs and imaging studies  CBC: Recent Labs  Lab 09/18/20 1114 09/19/20 0414 09/20/20 0456  WBC 10.3 8.4 7.1  NEUTROABS  --   --  4.5  HGB 14.4 11.1* 12.0  HCT 41.1 32.2* 34.0*  MCV 90.7 90.7 91.9  PLT 173 150 710   Basic Metabolic Panel: Recent Labs  Lab 09/18/20 1114 09/19/20 0414 09/19/20 1426 09/20/20 0456  NA 135 138  --  140  K 3.4* 2.8* 3.8 3.4*  CL 99 107  --  110  CO2 19* 21*  --  21*  GLUCOSE 133* 98  --  133*  BUN 6* <5*  --  5*  CREATININE 0.79 0.50  --  0.60  CALCIUM 9.2 8.3*  --  8.7*  MG  --  1.7  --  1.8   GFR: Estimated Creatinine Clearance: 61.7 mL/min (by C-G formula based on SCr of 0.6 mg/dL). Liver Function Tests: Recent Labs  Lab 09/18/20 1114 09/19/20 0414 09/20/20 0456  AST 82* 41 32  ALT 224* 132* 105*  ALKPHOS 192* 140* 122  BILITOT 1.6* 1.1 1.0  PROT 8.2* 6.2* 7.0  ALBUMIN 3.8 3.0* 3.2*   Recent Labs  Lab 09/18/20 1114  LIPASE 27   No results for input(s): AMMONIA in the last 168 hours. Coagulation Profile: No results for input(s): INR, PROTIME in the last 168 hours. Cardiac Enzymes: No results for input(s): CKTOTAL, CKMB, CKMBINDEX, TROPONINI in the last 168 hours. BNP (last 3 results) No results for input(s): PROBNP in the last 8760 hours. HbA1C: No results for input(s): HGBA1C in the last 72 hours. CBG: No results for input(s): GLUCAP in the last 168 hours. Lipid Profile: No results for input(s): CHOL, HDL, LDLCALC, TRIG, CHOLHDL, LDLDIRECT in the last 72 hours. Thyroid Function Tests: Recent Labs    09/19/20 0414  TSH 3.700  FREET4 1.58*   Anemia Panel: No results for input(s): VITAMINB12, FOLATE, FERRITIN, TIBC, IRON, RETICCTPCT in the last 72 hours. Sepsis Labs: No results for input(s): PROCALCITON, LATICACIDVEN in the last 168 hours.  Recent Results (from the  past 240 hour(s))  Urine Culture     Status: Abnormal (Preliminary result)   Collection Time: 09/18/20  2:17 PM   Specimen: Urine, Random  Result Value Ref Range Status   Specimen Description   Final    URINE, RANDOM Performed at [facility], [address], [place], [place] [address]    Special Requests   Final    NONE Performed at [facility], [address] [place], [place] [address]    Culture (A)  Final    >=100,000 COLONIES/mL ENTEROBACTER AEROGENES SUSCEPTIBILITIES TO FOLLOW Performed at [facility], [place] [address], [place], [place] [address]    Report Status PENDING  Incomplete  Resp Panel by RT-PCR (Flu A&B, Covid) Nasopharyngeal Swab     Status: None   Collection Time: 09/18/20  5:40  PM   Specimen: Nasopharyngeal Swab; Nasopharyngeal(NP) swabs in vial transport medium  Result Value Ref Range Status   SARS Coronavirus 2 by RT PCR NEGATIVE NEGATIVE Final    Comment: (NOTE) SARS-CoV-2 target nucleic acids are NOT DETECTED.  The SARS-CoV-2 RNA is generally detectable in upper respiratory specimens during the acute phase of infection. The lowest concentration of SARS-CoV-2 viral copies this assay can detect is 138 copies/mL. A negative result does not preclude SARS-Cov-2 infection and should not be used as the sole basis for treatment or other patient management decisions. A negative result may occur with  improper specimen collection/handling, submission of specimen other than nasopharyngeal swab, presence of viral mutation(s) within the areas targeted by this assay, and inadequate number of viral copies(<138 copies/mL). A negative result must be combined with clinical observations, patient history, and epidemiological information. The expected result is Negative.  Fact Sheet for Patients:  EntrepreneurPulse.com.au  Fact Sheet for Healthcare Providers:  IncredibleEmployment.be  This test is no t yet  approved or cleared by the [place] FDA and  has been authorized for detection and/or diagnosis of SARS-CoV-2 by FDA under an Emergency Use Authorization (EUA). This EUA will remain  in effect (meaning this test can be used) for the duration of the COVID-19 declaration under Section 564(b)(1) of the Act, 21 U.S.C.section 360bbb-3(b)(1), unless the authorization is terminated  or revoked sooner.       Influenza A by PCR NEGATIVE NEGATIVE Final   Influenza B by PCR NEGATIVE NEGATIVE Final    Comment: (NOTE) The Xpert Xpress SARS-CoV-2/FLU/RSV plus assay is intended as an aid in the diagnosis of influenza from Nasopharyngeal swab specimens and should not be used as a sole basis for treatment. Nasal washings and aspirates are unacceptable for Xpert Xpress SARS-CoV-2/FLU/RSV testing.  Fact Sheet for Patients: EntrepreneurPulse.com.au  Fact Sheet for Healthcare Providers: IncredibleEmployment.be  This test is not yet approved or cleared by the [place] FDA and has been authorized for detection and/or diagnosis of SARS-CoV-2 by FDA under an Emergency Use Authorization (EUA). This EUA will remain in effect (meaning this test can be used) for the duration of the COVID-19 declaration under Section 564(b)(1) of the Act, 21 U.S.C. section 360bbb-3(b)(1), unless the authorization is terminated or revoked.  Performed at [facility], [address], [place], [place] [address]          Radiology Studies: CT ABDOMEN PELVIS W CONTRAST  Result Date: 09/18/2020 CLINICAL DATA:  Abdominal distension, choledochal lithiasis seen on prior MRCP. EXAM: CT ABDOMEN AND PELVIS WITH CONTRAST TECHNIQUE: Multidetector CT imaging of the abdomen and pelvis was performed using the standard protocol following bolus administration of intravenous contrast. CONTRAST:  177mL OMNIPAQUE IOHEXOL 300 MG/ML  SOLN COMPARISON:  MRCP dated 09/18/2020. FINDINGS:  Lower chest: No acute abnormality. Hepatobiliary: A hypoattenuating lesion in the left hepatic lobe measures 1.7 cm and demonstrated internal debris and rim enhancement on same day MRCP (series 2, image 20). A 9 mm benign cyst is seen in the right hepatic lobe (series 2, image 22). A few additional lesions identified on MRCP are less well seen on this exam. No gallbladder wall thickening. The common bile duct is mildly dilated, measuring up to 6 mm. The patient's known cholelithiasis and choledocholithiasis is not well seen on this exam. Pancreas: Unremarkable. No pancreatic ductal dilatation or surrounding inflammatory changes. Spleen: A 4 mm hypoattenuating lesion in the spleen is consistent with a benign cyst. Adrenals/Urinary Tract: Adrenal glands are unremarkable. Bilateral renal  cysts measure up to 7 mm. Nonobstructive renal calculi measure 2 mm, one in each kidney. No hydronephrosis. Bladder is unremarkable. Stomach/Bowel: Stomach is within normal limits. No pericecal inflammatory changes are noted to suggest acute appendicitis. No evidence of bowel wall thickening, distention, or inflammatory changes. Vascular/Lymphatic: No significant vascular findings are present. No enlarged abdominal or pelvic lymph nodes. Reproductive: Status post hysterectomy. No adnexal masses. Other: No abdominal wall hernia or abnormality. No abdominopelvic ascites. Musculoskeletal: Degenerative changes are seen in the spine. The patient is status post internal fixation of the proximal left femur. There is a chronic appearing compression deformity of L1. IMPRESSION: 1. Known cholelithiasis and choledocholithiasis better characterized on same day MRCP. The common bile duct is mildly dilated, measuring up to 6 mm. 2. Multiple hepatic lesions are better characterized on same day MRCP. Aortic Atherosclerosis (ICD10-I70.0). Electronically Signed   By: Zerita Boers M.D.   On: 09/18/2020 18:42   MR 3D Recon At Scanner  Result Date:  09/18/2020 CLINICAL DATA:  Nausea vomiting and diarrhea with laboratory signs of biliary obstruction. EXAM: MRI ABDOMEN WITHOUT AND WITH CONTRAST (INCLUDING MRCP) TECHNIQUE: Multiplanar multisequence MR imaging of the abdomen was performed both before and after the administration of intravenous contrast. Heavily T2-weighted images of the biliary and pancreatic ducts were obtained, and three-dimensional MRCP images were rendered by post processing. CONTRAST:  41mL GADAVIST GADOBUTROL 1 MMOL/ML IV SOLN COMPARISON:  Same day ultrasound. FINDINGS: Lower chest: No acute findings. Hepatobiliary: No hepatic steatosis. There are at least 3 cystic rim enhancing lesions in the liver the largest of which is in the anterior left lobe of the liver measuring 1.7 cm on image 36/17 with that cysts demonstrating some internal debris, a smaller rim enhancing cystic lesion is visualized in the right lobe of the liver measuring 6 mm on image 34/22 with a similar appearing smaller 3 mm lesion in the lateral left lobe of the liver on image 33/22. There is a wedge-shaped area of increased arterial enhancement along the falciform ligament which surrounds the larger lesion as well as some increased T2 signal in this area suggestive of hepatic edema. There are 2 nonenhancing simple cyst in the right lobe of the liver the largest of which measures 9 mm. Cholelithiasis measuring up to 5 mm without pericholecystic fluid or gallbladder wall thickening. There is mild intra and extrahepatic biliary ductal dilation with the common duct measuring 7 mm on image 10/10. There are 2 small choledocholithiasis in the distal common duct measuring up to 4 mm on image 16/3. There is no peribiliary enhancement. Pancreas: No mass, inflammatory changes, or other parenchymal abnormality identified. Spleen: There is a nonenhancing 4 mm cystic lesion in the spleen, likely a benign cyst or lymphangioma. Adrenals/Urinary Tract: Bilateral adrenal glands are  unremarkable. No hydronephrosis. Small bilateral cysts measuring up to 7 mm in the left kidney. No solid enhancing renal lesions. Stomach/Bowel: Visualized portions within the abdomen are unremarkable. Vascular/Lymphatic: Aortic atherosclerosis without aneurysmal dilation. Other:  None. Musculoskeletal: No suspicious bone lesions identified. IMPRESSION: 1. There is mild intra and extrahepatic biliary ductal dilation with 2 small choledocholithiasis in the distal common duct measuring up to 4 mm. No peribiliary enhancement. 2. Rim enhancing cystic hepatic lesions measuring up to 1.7 cm as well as 2 smaller subcentimeter lesions, with this larger lesion demonstrating some internal complexity with associated hepatic edema, findings which are nonspecific but suspicious for hepatic abscess/micro abscesses within alternate diagnosis including biliary cystadenoma. Recommend correlation with laboratory values for signs  of infection. There are no MRI findings to suggest ascending cholangitis, as such recommend CT abdomen and pelvis to assess for a non biliary source of infection if continued clinical concern for infection. 3. Cholelithiasis without evidence of acute cholecystitis. These results were called by telephone at the time of interpretation on 09/18/2020 at 5:19 pm to provider Dr. Nickolas Madrid, who verbally acknowledged these results. Electronically Signed   By: Dahlia Bailiff MD   On: 09/18/2020 17:21   DG C-Arm 1-60 Min-No Report  Result Date: 09/20/2020 Fluoroscopy was utilized by the requesting physician.  No radiographic interpretation.   MR ABDOMEN MRCP W WO CONTAST  Result Date: 09/18/2020 CLINICAL DATA:  Nausea vomiting and diarrhea with laboratory signs of biliary obstruction. EXAM: MRI ABDOMEN WITHOUT AND WITH CONTRAST (INCLUDING MRCP) TECHNIQUE: Multiplanar multisequence MR imaging of the abdomen was performed both before and after the administration of intravenous contrast. Heavily T2-weighted images of  the biliary and pancreatic ducts were obtained, and three-dimensional MRCP images were rendered by post processing. CONTRAST:  52mL GADAVIST GADOBUTROL 1 MMOL/ML IV SOLN COMPARISON:  Same day ultrasound. FINDINGS: Lower chest: No acute findings. Hepatobiliary: No hepatic steatosis. There are at least 3 cystic rim enhancing lesions in the liver the largest of which is in the anterior left lobe of the liver measuring 1.7 cm on image 36/17 with that cysts demonstrating some internal debris, a smaller rim enhancing cystic lesion is visualized in the right lobe of the liver measuring 6 mm on image 34/22 with a similar appearing smaller 3 mm lesion in the lateral left lobe of the liver on image 33/22. There is a wedge-shaped area of increased arterial enhancement along the falciform ligament which surrounds the larger lesion as well as some increased T2 signal in this area suggestive of hepatic edema. There are 2 nonenhancing simple cyst in the right lobe of the liver the largest of which measures 9 mm. Cholelithiasis measuring up to 5 mm without pericholecystic fluid or gallbladder wall thickening. There is mild intra and extrahepatic biliary ductal dilation with the common duct measuring 7 mm on image 10/10. There are 2 small choledocholithiasis in the distal common duct measuring up to 4 mm on image 16/3. There is no peribiliary enhancement. Pancreas: No mass, inflammatory changes, or other parenchymal abnormality identified. Spleen: There is a nonenhancing 4 mm cystic lesion in the spleen, likely a benign cyst or lymphangioma. Adrenals/Urinary Tract: Bilateral adrenal glands are unremarkable. No hydronephrosis. Small bilateral cysts measuring up to 7 mm in the left kidney. No solid enhancing renal lesions. Stomach/Bowel: Visualized portions within the abdomen are unremarkable. Vascular/Lymphatic: Aortic atherosclerosis without aneurysmal dilation. Other:  None. Musculoskeletal: No suspicious bone lesions identified.  IMPRESSION: 1. There is mild intra and extrahepatic biliary ductal dilation with 2 small choledocholithiasis in the distal common duct measuring up to 4 mm. No peribiliary enhancement. 2. Rim enhancing cystic hepatic lesions measuring up to 1.7 cm as well as 2 smaller subcentimeter lesions, with this larger lesion demonstrating some internal complexity with associated hepatic edema, findings which are nonspecific but suspicious for hepatic abscess/micro abscesses within alternate diagnosis including biliary cystadenoma. Recommend correlation with laboratory values for signs of infection. There are no MRI findings to suggest ascending cholangitis, as such recommend CT abdomen and pelvis to assess for a non biliary source of infection if continued clinical concern for infection. 3. Cholelithiasis without evidence of acute cholecystitis. These results were called by telephone at the time of interpretation on 09/18/2020 at 5:19 pm to  provider Dr. Nickolas Madrid, who verbally acknowledged these results. Electronically Signed   By: Dahlia Bailiff MD   On: 09/18/2020 17:21        Scheduled Meds: . atorvastatin  40 mg Oral QHS  . famotidine  40 mg Oral Daily  . metoprolol succinate  50 mg Oral Daily  . pantoprazole  40 mg Oral Daily  . potassium chloride  20 mEq Oral BID  . Vilazodone HCl  40 mg Oral Daily   Continuous Infusions: . cefTRIAXone (ROCEPHIN)  IV 1 g (09/20/20 1350)     LOS: 2 days    Time spent: 35 minutes  General.  Well-developed lady, in no acute distress. Pulmonary.  Lungs clear bilaterally, normal respiratory effort. CV.  Regular rate and rhythm, no JVD, rub or murmur. Abdomen.  Soft, nontender, nondistended, BS positive. CNS.  Alert and oriented x3.  No focal neurologic deficit. Extremities.  No edema, no cyanosis, pulses intact and symmetrical. Psychiatry.  Judgment and insight appears normal.  Lorella Nimrod, MD [facility] This record has been created using Therapist, sports. Errors have been sought and corrected,but may not always be located. Such creation errors do not reflect on the standard of care.   To contact the attending provider between 7A-7P or the covering provider during after hours 7P-7A, please log into the web site [email or web address] and access using universal [place] password for that web site. If you do not have the password, please call the hospital operator.  09/20/2020, 2:23 PM

## 2020-09-20 NOTE — Anesthesia Procedure Notes (Signed)
Procedure Name: Intubation Date/Time: 09/20/2020 11:49 AM Performed by: Jonna Clark, CRNA Pre-anesthesia Checklist: Patient identified, Patient being monitored, Timeout performed, Emergency Drugs available and Suction available Patient Re-evaluated:Patient Re-evaluated prior to induction Oxygen Delivery Method: Circle system utilized Preoxygenation: Pre-oxygenation with 100% oxygen Induction Type: IV induction Ventilation: Mask ventilation without difficulty Laryngoscope Size: 3 and McGraph Grade View: Grade I Tube type: Oral Tube size: 7.0 mm Number of attempts: 1 Airway Equipment and Method: Stylet Placement Confirmation: ETT inserted through vocal cords under direct vision,  positive ETCO2 and breath sounds checked- equal and bilateral Secured at: 21 cm Tube secured with: Tape Dental Injury: Teeth and Oropharynx as per pre-operative assessment

## 2020-09-20 NOTE — Anesthesia Postprocedure Evaluation (Signed)
Anesthesia Post Note  Patient: Wanda [name]  Procedure(s) Performed: ENDOSCOPIC RETROGRADE CHOLANGIOPANCREATOGRAPHY (ERCP) WITH PROPOFOL (N/A )  Patient location during evaluation: PACU Anesthesia Type: General Level of consciousness: awake and alert and oriented Pain management: pain level controlled Vital Signs Assessment: post-procedure vital signs reviewed and stable Respiratory status: spontaneous breathing Cardiovascular status: blood pressure returned to baseline Anesthetic complications: no   No complications documented.   Last Vitals:  Vitals:   09/20/20 1306 09/20/20 1524  BP: (!) 131/92 130/85  Pulse: 85 91  Resp:  17  Temp: 36.7 C 36.7 C  SpO2: 100% 98%    Last Pain:  Vitals:   09/20/20 1524  TempSrc: Oral  PainSc:                  Alessander Sikorski

## 2020-09-20 NOTE — Anesthesia Preprocedure Evaluation (Signed)
Anesthesia Evaluation  Patient identified by MRN, date of birth, ID band Patient awake    Reviewed: Allergy & Precautions, H&P , NPO status , Patient's Chart, lab work & pertinent test results  Airway Mallampati: III  TM Distance: <3 FB Neck ROM: limited    Dental  (+) Chipped, Poor Dentition, Missing, Partial Lower, Upper Dentures   Pulmonary neg shortness of breath, former smoker,    Pulmonary exam normal        Cardiovascular Exercise Tolerance: Good hypertension, (-) angina(-) DOE Normal cardiovascular exam+ dysrhythmias      Neuro/Psych  Headaches, PSYCHIATRIC DISORDERS Anxiety Depression  Neuromuscular disease    GI/Hepatic Neg liver ROS, GERD  Medicated and Controlled,  Endo/Other  negative endocrine ROS  Renal/GU      Musculoskeletal  (+) Arthritis , Osteoarthritis,    Abdominal   Peds  Hematology negative hematology ROS (+)   Anesthesia Other Findings Past Medical History: No date: Anxiety No date: Borderline diabetes 06/25/2010: BRONCHITIS, ACUTE WITH BRONCHOSPASM     Comment:  Qualifier: Diagnosis of  By: Alveta Heimlich MD, Cornelia Copa   10/18/2015: Chronic prescription benzodiazepine use No date: Depression No date: Dysrhythmia     Comment:  tachycardia 05/14/2009: GASTROENTERITIS WITHOUT DEHYDRATION     Comment:  Qualifier: Diagnosis of  By: Deborra Medina MD, Talia   No date: GERD (gastroesophageal reflux disease) No date: 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/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               Dossie Arbour Serial number: [id number] Model number: [id number]   Past Surgical History: No  date: ABDOMINAL HYSTERECTOMY No date: APPENDECTOMY No date: BACK SURGERY     Comment:  X 3 No date: BREAST EXCISIONAL BIOPSY; Right     Comment:  surgical bx age 33  08/28/2013: COLONOSCOPY; N/A     Comment:  Procedure: COLONOSCOPY;  Surgeon: Danie Binder, MD;                Location: [facility];  Service: Endoscopy;                Laterality: N/A;  8:30 AM 05/30/2016: INTRAMEDULLARY (IM) NAIL INTERTROCHANTERIC; Left     Comment:  Procedure: INTRAMEDULLARY (IM) NAIL INTERTROCHANTRIC;                Surgeon: Thornton Park, MD;  Location: [facility];                Service: Orthopedics;  Laterality: Left; No date: SHOULDER SURGERY; Right No date: Spinal cord stimulator     Reproductive/Obstetrics negative OB ROS                             Anesthesia Physical  Anesthesia Plan  ASA: III  Anesthesia Plan: General   Post-op Pain Management:    Induction: Intravenous  PONV Risk Score and Plan:   Airway Management Planned: Oral ETT  Additional Equipment:   Intra-op Plan:   Post-operative Plan: Extubation in OR  Informed Consent: I have reviewed the patients History and Physical, chart, labs and discussed the procedure including the risks, benefits and alternatives for the proposed anesthesia with  the patient or authorized representative who has indicated his/her understanding and acceptance.     Dental Advisory Given  Plan Discussed with: Anesthesiologist, CRNA and Surgeon  Anesthesia Plan Comments: (Patient consented for risks of anesthesia including but not limited to:  - adverse reactions to medications - risk of intubation if required - damage to eyes, teeth, lips or other oral mucosa - nerve damage due to positioning  - sore throat or hoarseness - Damage to heart, brain, nerves, lungs, other parts of body or loss of life  Patient voiced understanding.)        Anesthesia Quick Evaluation

## 2020-09-20 NOTE — Progress Notes (Signed)
Patient back to the room from ERCP, vitals taken, & tele in place. Orders received for clear liquid diet

## 2020-09-20 NOTE — Transfer of Care (Signed)
Immediate Anesthesia Transfer of Care Note  Patient: Wanda [name]  Procedure(s) Performed: ENDOSCOPIC RETROGRADE CHOLANGIOPANCREATOGRAPHY (ERCP) WITH PROPOFOL (N/A )  Patient Location: PACU  Anesthesia Type:General  Level of Consciousness: drowsy and patient cooperative  Airway & Oxygen Therapy: Patient Spontanous Breathing and Patient connected to nasal cannula oxygen  Post-op Assessment: Report given to RN and Post -op Vital signs reviewed and stable  Post vital signs: Reviewed and stable  Last Vitals:  Vitals Value Taken Time  BP 116/81 09/20/20 1229  Temp 36.2 C 09/20/20 1225  Pulse 91 09/20/20 1229  Resp 22 09/20/20 1229  SpO2 100 % 09/20/20 1229  Vitals shown include unvalidated device data.  Last Pain:  Vitals:   09/20/20 1225  TempSrc:   PainSc: 0-No pain      Patients Stated Pain Goal: 2 (25/05/39 7673)  Complications: No complications documented.

## 2020-09-21 ENCOUNTER — Encounter
Admission: EM | Disposition: A | Discharge status: Home / Self Care | Payer: Self-pay | Source: Home / Self Care | Attending: Internal Medicine

## 2020-09-21 ENCOUNTER — Encounter: Payer: Self-pay | Admitting: Family Medicine

## 2020-09-21 ENCOUNTER — Inpatient Hospital Stay: Payer: Medicare Other | Admitting: Anesthesiology

## 2020-09-21 DIAGNOSIS — K8010 Calculus of gallbladder with chronic cholecystitis without obstruction: Secondary | ICD-10-CM

## 2020-09-21 LAB — CBC
HCT: 35.1 % — ABNORMAL LOW (ref 36.0–46.0)
Hemoglobin: 12.1 g/dL (ref 12.0–15.0)
MCH: 31.6 pg (ref 26.0–34.0)
MCHC: 34.5 g/dL (ref 30.0–36.0)
MCV: 91.6 fL (ref 80.0–100.0)
Platelets: 238 10*3/uL (ref 150–400)
RBC: 3.83 MIL/uL — ABNORMAL LOW (ref 3.87–5.11)
RDW: 11.8 % (ref 11.5–15.5)
WBC: 8.9 10*3/uL (ref 4.0–10.5)
nRBC: 0 % (ref 0.0–0.2)

## 2020-09-21 LAB — COMPREHENSIVE METABOLIC PANEL
ALT: 86 U/L — ABNORMAL HIGH (ref 0–44)
AST: 31 U/L (ref 15–41)
Albumin: 3.1 g/dL — ABNORMAL LOW (ref 3.5–5.0)
Alkaline Phosphatase: 130 U/L — ABNORMAL HIGH (ref 38–126)
Anion gap: 8 (ref 5–15)
BUN: 5 mg/dL — ABNORMAL LOW (ref 8–23)
CO2: 23 mmol/L (ref 22–32)
Calcium: 9 mg/dL (ref 8.9–10.3)
Chloride: 108 mmol/L (ref 98–111)
Creatinine, Ser: 0.58 mg/dL (ref 0.44–1.00)
GFR, Estimated: >60 mL/min (ref >60)
Glucose, Bld: 139 mg/dL — ABNORMAL HIGH (ref 70–99)
Potassium: 3.9 mmol/L (ref 3.5–5.1)
Sodium: 139 mmol/L (ref 135–145)
Total Bilirubin: 0.6 mg/dL (ref 0.3–1.2)
Total Protein: 7.1 g/dL (ref 6.5–8.1)

## 2020-09-21 LAB — T3, FREE: T3, Free: 3.2 pg/mL (ref 2.0–4.4)

## 2020-09-21 LAB — URINE CULTURE: Culture: >=100000 — AB

## 2020-09-21 LAB — T3: T3, Total: 136 ng/dL (ref 71–180)

## 2020-09-21 SURGERY — CHOLECYSTECTOMY, ROBOT-ASSISTED, LAPAROSCOPIC
Anesthesia: General

## 2020-09-21 MED ORDER — MIDAZOLAM HCL 2 MG/2ML IJ SOLN
INTRAMUSCULAR | Status: AC
  Filled 2020-09-21: qty 2

## 2020-09-21 MED ORDER — SUGAMMADEX SODIUM 200 MG/2ML IV SOLN
INTRAVENOUS | Status: DC | PRN
  Administered 2020-09-21: 200 mg via INTRAVENOUS

## 2020-09-21 MED ORDER — ACETAMINOPHEN 10 MG/ML IV SOLN
INTRAVENOUS | Status: AC
  Filled 2020-09-21: qty 100

## 2020-09-21 MED ORDER — SODIUM CHLORIDE 0.9 % IV SOLN
INTRAVENOUS | Status: DC | PRN
  Administered 2020-09-21: 20 ug/min via INTRAVENOUS

## 2020-09-21 MED ORDER — ROCURONIUM BROMIDE 10 MG/ML (PF) SYRINGE
PREFILLED_SYRINGE | INTRAVENOUS | Status: AC
  Filled 2020-09-21: qty 20

## 2020-09-21 MED ORDER — DEXAMETHASONE SODIUM PHOSPHATE 10 MG/ML IJ SOLN
INTRAMUSCULAR | Status: AC
  Filled 2020-09-21: qty 2

## 2020-09-21 MED ORDER — MIDAZOLAM HCL 2 MG/2ML IJ SOLN
INTRAMUSCULAR | Status: DC | PRN
  Administered 2020-09-21: 2 mg via INTRAVENOUS

## 2020-09-21 MED ORDER — LACTATED RINGERS IV SOLN: INTRAVENOUS | Status: DC | PRN

## 2020-09-21 MED ORDER — ESMOLOL HCL 100 MG/10ML IV SOLN
INTRAVENOUS | Status: AC
  Filled 2020-09-21: qty 10

## 2020-09-21 MED ORDER — ONDANSETRON HCL 4 MG/2ML IJ SOLN
INTRAMUSCULAR | Status: AC
  Filled 2020-09-21: qty 4

## 2020-09-21 MED ORDER — PROPOFOL 10 MG/ML IV BOLUS
INTRAVENOUS | Status: DC | PRN
  Administered 2020-09-21: 110 mg via INTRAVENOUS

## 2020-09-21 MED ORDER — FENTANYL CITRATE (PF) 100 MCG/2ML IJ SOLN
25.0000 ug | INTRAMUSCULAR | Status: AC | PRN
  Administered 2020-09-21 (×6): 25 ug via INTRAVENOUS

## 2020-09-21 MED ORDER — ACETAMINOPHEN 10 MG/ML IV SOLN
INTRAVENOUS | Status: DC | PRN
  Administered 2020-09-21: 1000 mg via INTRAVENOUS

## 2020-09-21 MED ORDER — FENTANYL CITRATE (PF) 100 MCG/2ML IJ SOLN
INTRAMUSCULAR | Status: AC
  Administered 2020-09-21: 25 ug via INTRAVENOUS
  Filled 2020-09-21: qty 2

## 2020-09-21 MED ORDER — OXYCODONE HCL 5 MG PO TABS
5.0000 mg | ORAL_TABLET | ORAL | Status: DC | PRN
  Administered 2020-09-21: 10 mg via ORAL
  Administered 2020-09-21: 5 mg via ORAL
  Filled 2020-09-21: qty 2

## 2020-09-21 MED ORDER — OXYCODONE HCL 5 MG PO TABS
ORAL_TABLET | ORAL | Status: AC
  Filled 2020-09-21: qty 1

## 2020-09-21 MED ORDER — LIDOCAINE HCL (CARDIAC) PF 100 MG/5ML IV SOSY
PREFILLED_SYRINGE | INTRAVENOUS | Status: DC | PRN
  Administered 2020-09-21: 70 mg via INTRAVENOUS

## 2020-09-21 MED ORDER — DEXMEDETOMIDINE (PRECEDEX) IN NS 20 MCG/5ML (4 MCG/ML) IV SYRINGE
PREFILLED_SYRINGE | INTRAVENOUS | Status: DC | PRN
  Administered 2020-09-21 (×5): 4 ug via INTRAVENOUS

## 2020-09-21 MED ORDER — BUPIVACAINE-EPINEPHRINE (PF) 0.25% -1:200000 IJ SOLN
INTRAMUSCULAR | Status: AC
  Filled 2020-09-21: qty 30

## 2020-09-21 MED ORDER — ONDANSETRON HCL 4 MG/2ML IJ SOLN: 4.0000 mg | Freq: Once | INTRAMUSCULAR | Status: DC | PRN

## 2020-09-21 MED ORDER — FENTANYL CITRATE (PF) 100 MCG/2ML IJ SOLN
INTRAMUSCULAR | Status: DC | PRN
  Administered 2020-09-21 (×4): 50 ug via INTRAVENOUS

## 2020-09-21 MED ORDER — KETOROLAC TROMETHAMINE 30 MG/ML IJ SOLN
INTRAMUSCULAR | Status: DC | PRN
  Administered 2020-09-21: 30 mg via INTRAVENOUS

## 2020-09-21 MED ORDER — PHENYLEPHRINE HCL (PRESSORS) 10 MG/ML IV SOLN
INTRAVENOUS | Status: DC | PRN
  Administered 2020-09-21 (×3): 100 ug via INTRAVENOUS
  Administered 2020-09-21: 200 ug via INTRAVENOUS
  Administered 2020-09-21: 100 ug via INTRAVENOUS

## 2020-09-21 MED ORDER — SODIUM CHLORIDE FLUSH 0.9 % IV SOLN
INTRAVENOUS | Status: AC
  Filled 2020-09-21: qty 10

## 2020-09-21 MED ORDER — ESMOLOL HCL 100 MG/10ML IV SOLN
INTRAVENOUS | Status: DC | PRN
  Administered 2020-09-21: 20 mg via INTRAVENOUS

## 2020-09-21 MED ORDER — ROCURONIUM BROMIDE 100 MG/10ML IV SOLN
INTRAVENOUS | Status: DC | PRN
  Administered 2020-09-21: 50 mg via INTRAVENOUS
  Administered 2020-09-21: 10 mg via INTRAVENOUS

## 2020-09-21 MED ORDER — ONDANSETRON HCL 4 MG/2ML IJ SOLN
INTRAMUSCULAR | Status: DC | PRN
  Administered 2020-09-21: 4 mg via INTRAVENOUS

## 2020-09-21 MED ORDER — FENTANYL CITRATE (PF) 100 MCG/2ML IJ SOLN
INTRAMUSCULAR | Status: AC
  Filled 2020-09-21: qty 2

## 2020-09-21 MED ORDER — BUPIVACAINE-EPINEPHRINE (PF) 0.25% -1:200000 IJ SOLN
INTRAMUSCULAR | Status: DC | PRN
  Administered 2020-09-21: 30 mL

## 2020-09-21 MED ORDER — GLYCOPYRROLATE 0.2 MG/ML IJ SOLN
INTRAMUSCULAR | Status: AC
  Filled 2020-09-21: qty 1

## 2020-09-21 MED ORDER — PHENYLEPHRINE HCL (PRESSORS) 10 MG/ML IV SOLN
INTRAVENOUS | Status: AC
  Filled 2020-09-21: qty 1

## 2020-09-21 MED ORDER — DEXAMETHASONE SODIUM PHOSPHATE 10 MG/ML IJ SOLN
INTRAMUSCULAR | Status: DC | PRN
  Administered 2020-09-21: 10 mg via INTRAVENOUS

## 2020-09-21 MED ORDER — GLYCOPYRROLATE 0.2 MG/ML IJ SOLN
INTRAMUSCULAR | Status: DC | PRN
  Administered 2020-09-21: .2 mg via INTRAVENOUS

## 2020-09-21 SURGICAL SUPPLY — 58 items
ADH SKN CLS APL DERMABOND .7 (GAUZE/BANDAGES/DRESSINGS) ×1
APL PRP STRL LF DISP 70% ISPRP (MISCELLANEOUS) ×1
BAG INFUSER PRESSURE 100CC (MISCELLANEOUS) IMPLANT
BAG SPEC RTRVL LRG 6X4 10 (ENDOMECHANICALS) ×1
CANISTER SUCT 1200ML W/VALVE (MISCELLANEOUS) ×1 IMPLANT
CANNULA REDUC XI 12-8 STAPL (CANNULA) ×2
CANNULA REDUCER 12-8 DVNC XI (CANNULA) ×1 IMPLANT
CHLORAPREP W/TINT 26 (MISCELLANEOUS) ×2 IMPLANT
CLIP VESOLOCK MED LG 6/CT (CLIP) ×2 IMPLANT
COVER WAND RF STERILE (DRAPES) ×2 IMPLANT
CUP MEDICINE 2OZ PLAST GRAD ST (MISCELLANEOUS) ×2 IMPLANT
DECANTER SPIKE VIAL GLASS SM (MISCELLANEOUS) ×2 IMPLANT
DEFOGGER SCOPE WARMER CLEARIFY (MISCELLANEOUS) ×2 IMPLANT
DERMABOND ADVANCED (GAUZE/BANDAGES/DRESSINGS) ×1
DERMABOND ADVANCED .7 DNX12 (GAUZE/BANDAGES/DRESSINGS) ×1 IMPLANT
DRAPE ARM DVNC X/XI (DISPOSABLE) ×4 IMPLANT
DRAPE COLUMN DVNC XI (DISPOSABLE) ×1 IMPLANT
DRAPE DA VINCI XI ARM (DISPOSABLE) ×8
DRAPE DA VINCI XI COLUMN (DISPOSABLE) ×2
ELECT CAUTERY BLADE TIP 2.5 (TIP) ×2
ELECT REM PT RETURN 9FT ADLT (ELECTROSURGICAL) ×2
ELECTRODE CAUTERY BLDE TIP 2.5 (TIP) ×1 IMPLANT
ELECTRODE REM PT RTRN 9FT ADLT (ELECTROSURGICAL) ×1 IMPLANT
GLOVE SURG SYN 7.0 (GLOVE) ×8 IMPLANT
GLOVE SURG SYN 7.0 PF PI (GLOVE) ×2 IMPLANT
GLOVE SURG SYN 7.5  E (GLOVE) ×8
GLOVE SURG SYN 7.5 E (GLOVE) ×4 IMPLANT
GLOVE SURG SYN 7.5 PF PI (GLOVE) ×2 IMPLANT
GOWN STRL REUS W/ TWL LRG LVL3 (GOWN DISPOSABLE) ×4 IMPLANT
GOWN STRL REUS W/TWL LRG LVL3 (GOWN DISPOSABLE) ×8
IRRIGATOR SUCT 8 DISP DVNC XI (IRRIGATION / IRRIGATOR) IMPLANT
IRRIGATOR SUCTION 8MM XI DISP (IRRIGATION / IRRIGATOR)
IV NS 1000ML (IV SOLUTION)
IV NS 1000ML BAXH (IV SOLUTION) IMPLANT
KIT PINK PAD W/HEAD ARE REST (MISCELLANEOUS) ×2
KIT PINK PAD W/HEAD ARM REST (MISCELLANEOUS) ×1 IMPLANT
LABEL OR SOLS (LABEL) ×2 IMPLANT
MANIFOLD NEPTUNE II (INSTRUMENTS) ×2 IMPLANT
NEEDLE HYPO 22GX1.5 SAFETY (NEEDLE) ×2 IMPLANT
NS IRRIG 500ML POUR BTL (IV SOLUTION) ×2 IMPLANT
OBTURATOR OPTICAL STANDARD 8MM (TROCAR) ×2
OBTURATOR OPTICAL STND 8 DVNC (TROCAR) ×1
OBTURATOR OPTICALSTD 8 DVNC (TROCAR) ×1 IMPLANT
PACK LAP CHOLECYSTECTOMY (MISCELLANEOUS) ×2 IMPLANT
PENCIL ELECTRO HAND CTR (MISCELLANEOUS) ×2 IMPLANT
POUCH SPECIMEN RETRIEVAL 10MM (ENDOMECHANICALS) ×2 IMPLANT
SEAL CANN UNIV 5-8 DVNC XI (MISCELLANEOUS) ×4 IMPLANT
SEAL XI 5MM-8MM UNIVERSAL (MISCELLANEOUS) ×8
SET TUBE SMOKE EVAC HIGH FLOW (TUBING) ×2 IMPLANT
SOLUTION ELECTROLUBE (MISCELLANEOUS) ×2 IMPLANT
SPONGE LAP 18X18 RF (DISPOSABLE) IMPLANT
SPONGE LAP 4X18 RFD (DISPOSABLE) ×2 IMPLANT
STAPLER CANNULA SEAL DVNC XI (STAPLE) ×1 IMPLANT
STAPLER CANNULA SEAL XI (STAPLE) ×2
SUT MNCRL AB 4-0 PS2 18 (SUTURE) ×2 IMPLANT
SUT VICRYL 0 AB UR-6 (SUTURE) ×4 IMPLANT
TAPE TRANSPORE STRL 2 31045 (GAUZE/BANDAGES/DRESSINGS) ×2 IMPLANT
TROCAR BALLN GELPORT 12X130M (ENDOMECHANICALS) ×2 IMPLANT

## 2020-09-21 NOTE — Progress Notes (Addendum)
[facility] Day(s): 3.   Post op day(s): 1 Day Post-Op.   Interval History:  Patient seen and examined No acute events or new complaints overnight.  Patient reports she is doing better, still with RUQ soreness No fever, chills, nausea, emesis Labs this morning are reassuring; no leukocytosis; renal afunction normal She was able to tolerate diet post ERCP, currently NPO Plan for laparoscopic cholecystectomy today with Dr Hampton Abbot   Vital signs in last 24 hours: [min-max] current  Temp:  [97 F (36.1 C)-98.6 F (37 C)] 98.2 F (36.8 C) (04/26 0415) Pulse Rate:  [79-105] 79 (04/26 0415) Resp:  [13-22] 18 (04/26 0415) BP: (102-131)/(56-92) 114/56 (04/26 0415) SpO2:  [94 %-100 %] 100 % (04/26 0415)     Height: 5\' 1"  (154.9 cm) Weight: 65.8 kg BMI (Calculated): 27.41   Intake/Output last 2 shifts:  04/25 0701 - 04/26 0700 In: 810 [P.O.:360; I.V.:450] Out: -    Physical Exam:  Constitutional: alert, cooperative and no distress  Respiratory: breathing non-labored at rest  Cardiovascular: regular rate and sinus rhythm  Gastrointestinal: Soft, non-tender, and non-distended, no rebound/guarding  Labs:  CBC Latest Ref Rng & Units 09/21/2020 09/20/2020 09/19/2020  WBC 4.0 - 10.5 K/uL 8.9 7.1 8.4  Hemoglobin 12.0 - 15.0 g/dL 12.1 12.0 11.1(L)  Hematocrit 36.0 - 46.0 % 35.1(L) 34.0(L) 32.2(L)  Platelets 150 - 400 K/uL 238 180 150   CMP Latest Ref Rng & Units 09/21/2020 09/20/2020 09/19/2020  Glucose 70 - 99 mg/dL 139(H) 133(H) -  BUN 8 - 23 mg/dL 5(L) 5(L) -  Creatinine 0.44 - 1.00 mg/dL 0.58 0.60 -  Sodium 135 - 145 mmol/L 139 140 -  Potassium 3.5 - 5.1 mmol/L 3.9 3.4(L) 3.8  Chloride 98 - 111 mmol/L 108 110 -  CO2 22 - 32 mmol/L 23 21(L) -  Calcium 8.9 - 10.3 mg/dL 9.0 8.7(L) -  Total Protein 6.5 - 8.1 g/dL 7.1 7.0 -  Total Bilirubin 0.3 - 1.2 mg/dL 0.6 1.0 -  Alkaline Phos 38 - 126 U/L 130(H) 122 -  AST 15 - 41 U/L 31 32 -  ALT  0 - 44 U/L 86(H) 105(H) -    Imaging studies: No new pertinent imaging studies   Assessment/Plan: (ICD-10's: K50.50) 65 y.o. female admitted with choledocholithiasis with cholecystitis s/p ERCP on 04/25   - Plan for robotic assisted laparoscopic cholecystectomy today with Dr Hampton Abbot   - All risks, benefits, and alternatives to above procedure(s) were discussed with the patient, all of her questions were answered to her expressed satisfaction, patient expresses she wishes to proceed, and informed consent was obtained.  - Perioperative ABx; on-call to OR  - NPO for procedure + IVF  - Monitor abdominal examination             - Pain control prn; antiemetics prn             - Mobilization as tolerated               - further management per primary service; we will follow     All of the above findings and recommendations were discussed with the patient, and the medical team, and all of patient's questions were answered to her expressed satisfaction.  -- Edison Simon, PA-C [facility] 09/21/2020, 7:12 AM [phone] M-F: 7am - 4pm

## 2020-09-21 NOTE — Care Management Important Message (Signed)
Important Message  Patient Details  Name: Wanda [name] MRN: [id number] Date of Birth: July 08, 1955   Medicare Important Message Given:  N/A - LOS <3 / Initial given by admissions  Initial Medicare IM reviewed with Noreene Larsson, son, at [phone] by S. Geanie Cooley, Patient Access Associate on 09/20/2020 at 9:20am.     Dannette Barbara 09/21/2020, 8:15 AM

## 2020-09-21 NOTE — Op Note (Signed)
  Procedure Date:  09/21/2020  Pre-operative Diagnosis:  Choledocholithiasis  Post-operative Diagnosis:  Choledocholithiasis  Procedure:  Robotic assisted cholecystectomy with ICG FireFly cholangiogram  Surgeon:  Melvyn Neth, MD  Assistant:  Wendall Papa, PA-S  Anesthesia:  General endotracheal  Estimated Blood Loss:  5 ml  Specimens:  gallbladder  Complications:  None  Indications for Procedure:  This is a 65 y.o. female who presents with abdominal pain and workup revealing choledocholithiasis.  She had her ERCP yesterday without complications.  The benefits, complications, treatment options, and expected outcomes were discussed with the patient. The risks of bleeding, infection, recurrence of symptoms, failure to resolve symptoms, bile duct damage, bile duct leak, retained common bile duct stone, bowel injury, and need for further procedures were all discussed with the patient and she was willing to proceed.  Description of Procedure: The patient was correctly identified in the preoperative area and brought into the operating room.  The patient was placed supine with VTE prophylaxis in place.  Appropriate time-outs were performed.  Anesthesia was induced and the patient was intubated.  Appropriate antibiotics were infused.  The abdomen was prepped and draped in a sterile fashion. An infraumbilical incision was made. A cutdown technique was used to enter the abdominal cavity without injury, and a 12 mm robotic port was inserted.  Pneumoperitoneum was obtained with appropriate opening pressures.  Three 8-mm ports were placed in the mid abdomen at the level of the umbilicus under direct visualization.  The DaVinci platform was docked, camera targeted, and instruments were placed under direct visualization.  The gallbladder was identified.  The fundus was grasped and retracted cephalad.  Adhesions were lysed bluntly and with electrocautery. The infundibulum was grasped and retracted  laterally, exposing the peritoneum overlying the gallbladder.  This was incised with electrocautery and extended on either side of the gallbladder.  FireFly cholangiogram was then obtained, and we were able to clearly identify the cystic duct and common bile duct.  The cystic duct and cystic artery were carefully dissected with combination of cautery and blunt dissection.  Both were clipped twice proximally and once distally, cutting in between.  The gallbladder was taken from the gallbladder fossa in a retrograde fashion with electrocautery. The gallbladder was placed in an Endocatch bag. The liver bed was inspected and any bleeding was controlled with electrocautery. The right upper quadrant was then inspected again revealing intact clips, no bleeding, and no ductal injury.  The 8 mm ports were removed under direct visualization and the 12 mm port was removed.  The Endocatch bag was brought out via the umbilical incision. The fascial opening was closed using 0 vicryl suture.  Local anesthetic was infused in all incisions and the incisions were closed with 4-0 Monocryl.  The wounds were cleaned and sealed with DermaBond.  The patient was emerged from anesthesia and extubated and brought to the recovery room for further management.  The patient tolerated the procedure well and all counts were correct at the end of the case.   Melvyn Neth, MD

## 2020-09-21 NOTE — Transfer of Care (Signed)
Immediate Anesthesia Transfer of Care Note  Patient: Wanda [name]  Procedure(s) Performed: XI ROBOTIC ASSISTED LAPAROSCOPIC CHOLECYSTECTOMY (N/A )  Patient Location: PACU  Anesthesia Type:General  Level of Consciousness: awake, alert  and oriented  Airway & Oxygen Therapy: Patient Spontanous Breathing and Patient connected to face mask oxygen  Post-op Assessment: Report given to RN and Post -op Vital signs reviewed and stable  Post vital signs: Reviewed and stable  Last Vitals:  Vitals Value Taken Time  BP 118/67 09/21/20 1150  Temp 36.5 C 09/21/20 1150  Pulse 84 09/21/20 1153  Resp 19 09/21/20 1153  SpO2 100 % 09/21/20 1153  Vitals shown include unvalidated device data.  Last Pain:  Vitals:   09/21/20 0936  TempSrc: Temporal  PainSc: 0-No pain      Patients Stated Pain Goal: 2 (28/76/81 1572)  Complications: No complications documented.

## 2020-09-21 NOTE — Anesthesia Postprocedure Evaluation (Signed)
Anesthesia Post Note  Patient: Wanda [name]  Procedure(s) Performed: XI ROBOTIC ASSISTED LAPAROSCOPIC CHOLECYSTECTOMY (N/A )  Patient location during evaluation: PACU Anesthesia Type: General Level of consciousness: awake and alert and oriented Pain management: pain level controlled Vital Signs Assessment: post-procedure vital signs reviewed and stable Respiratory status: spontaneous breathing, nonlabored ventilation and respiratory function stable Cardiovascular status: blood pressure returned to baseline and stable Postop Assessment: no signs of nausea or vomiting Anesthetic complications: no   No complications documented.   Last Vitals:  Vitals:   09/21/20 1320 09/21/20 1340  BP: 122/78 106/72  Pulse: 71 70  Resp: 18 18  Temp: 36.6 C 36.6 C  SpO2: 100% 100%    Last Pain:  Vitals:   09/21/20 1320  TempSrc:   PainSc: 3                  Jenin Birdsall

## 2020-09-21 NOTE — Anesthesia Procedure Notes (Signed)
Procedure Name: Intubation Date/Time: 09/21/2020 10:17 AM Performed by: Lily Peer, Lanaiya Lantry, CRNA Pre-anesthesia Checklist: Patient identified, Emergency Drugs available, Suction available and Patient being monitored Patient Re-evaluated:Patient Re-evaluated prior to induction Oxygen Delivery Method: Circle system utilized Preoxygenation: Pre-oxygenation with 100% oxygen Induction Type: IV induction Ventilation: Mask ventilation without difficulty Laryngoscope Size: McGraph and 3 Grade View: Grade I Tube type: Oral Tube size: 7.0 mm Number of attempts: 1 Airway Equipment and Method: Stylet Placement Confirmation: ETT inserted through vocal cords under direct vision,  positive ETCO2 and breath sounds checked- equal and bilateral Secured at: 21 cm Tube secured with: Tape Dental Injury: Teeth and Oropharynx as per pre-operative assessment

## 2020-09-21 NOTE — Progress Notes (Signed)
PROGRESS NOTE    Wanda [name]  [id number] DOB: 22-Nov-1955 DOA: 09/18/2020 PCP: Barbaraann Boys, MD   Chief complaint.  Abdominal pain. Brief Narrative:  Wanda [name] is a 65 y.o. female with medical history significant for chronic pain, migraine, paroxysmal SVT, hypertension, hyperlipidemia, anxiety, depression, PTSD, prediabetes and GERD who presents with concerns of abdominal pain. Total bilirubin 1.6, MRCP was performed, showed a mild intra and extrahepatic biliary duct dilation and 4 mm stone in the distal CBD. Patient did not show any evidence of infection.  GI and general surgery was consulted for cholelithiasis.  ERCP today with sphincterotomy and removal of 2 stones. Going for robotic cholecystectomy today. Can be discharged tomorrow morning per surgery  Assessment & Plan:   Principal Problem:   Choledocholithiasis Active Problems:   Hypokalemia   Anxiety and depression   Gastroesophageal reflux disease   Tachycardia   Essential hypertension   GAD (generalized anxiety disorder)   Acute lower UTI   Hepatic cyst  Choledocholithiasis without cholangitis. Patient has a mild elevation of liver enzyme without significant elevation of  Bilirubin, liver enzymes improved now. Abdominal pain is better. Patient had ERCP with GI today with sphincterotomy and removal of 2 stones, going for robotic cholecystectomy today with general surgery. -Per surgery can be discharged tomorrow morning   Thyromegaly/Sinus tachycardia/ PSVT. Patient has diffuse enlargement of the thyroid, she has been having tachycardia for a year, she had a Holter monitor recently, showed PSVT.  She also had intermittent diarrhea.  She also has significant anxiety.  Most likely she has hyperthyroidism.  However, her TSH is normal, free T4 is elevated.  I will also add a T3 and a free T3. If both T4 and T3 are elevated, patient need to treat for hyperthyroidism. -Continue with metoprolol.  UTI.  Urine  cultures growing Enterobacter aerugenes-good sensitivity except resistance to cefazolin and nitrofurantoin. -Continue ceftriaxone-to complete a 5-day course, can be discharged on cefdinir.  Hepatic cyst. MRCP suspect patient may have a liver abscess.  However, CT scan and a right upper quadrant ultrasound appear to be a simple cyst. Follow.  Hypokalemia.  Resolved -Replace potassium as needed and monitor.  DVT prophylaxis: SCDs Code Status: Full Family Communication: Discussed with patient. Disposition Plan:  .   Status is: Inpatient  Remains inpatient appropriate because:Inpatient level of care appropriate due to severity of illness   Dispo: The patient is from: Home              Anticipated d/c is to: Home              Patient currently is not medically stable to d/c.   Difficult to place patient No    I/O last 3 completed shifts: In: 72 [P.O.:360; I.V.:450] Out: -  No intake/output data recorded.    Consultants:   GI, General surgery  Procedures:  ERCP Robotic cholecystectomy  Antimicrobials: Rocephin  Subjective: Patient has no new complaint today.  Going for cholecystectomy.  Objective: Vitals:   09/20/20 1306 09/20/20 1524 09/20/20 2010 09/21/20 0415  BP: (!) 131/92 130/85 120/74 (!) 114/56  Pulse: 85 91 86 79  Resp:  17  18  Temp: 98.1 F (36.7 C) 98.1 F (36.7 C) 98.6 F (37 C) 98.2 F (36.8 C)  TempSrc: Oral Oral Oral   SpO2: 100% 98% 94% 100%  Weight:      Height:        Intake/Output Summary (Last 24 hours) at 09/21/2020 [id number] Last data filed  at 09/20/2020 2243 Gross per 24 hour  Intake 810 ml  Output --  Net 810 ml   Filed Weights   09/18/20 1107  Weight: 65.8 kg    Examination:  General.  Well-developed lady, in no acute distress. Pulmonary.  Lungs clear bilaterally, normal respiratory effort. CV.  Regular rate and rhythm, no JVD, rub or murmur. Abdomen.  Soft, nontender, nondistended, BS positive. CNS.  Alert and oriented  x3.  No focal neurologic deficit. Extremities.  No edema, no cyanosis, pulses intact and symmetrical. Psychiatry.  Judgment and insight appears normal.  Data Reviewed: I have personally reviewed following labs and imaging studies  CBC: Recent Labs  Lab 09/18/20 1114 09/19/20 0414 09/20/20 0456 09/21/20 0435  WBC 10.3 8.4 7.1 8.9  NEUTROABS  --   --  4.5  --   HGB 14.4 11.1* 12.0 12.1  HCT 41.1 32.2* 34.0* 35.1*  MCV 90.7 90.7 91.9 91.6  PLT 173 150 180 409   Basic Metabolic Panel: Recent Labs  Lab 09/18/20 1114 09/19/20 0414 09/19/20 1426 09/20/20 0456 09/21/20 0435  NA 135 138  --  140 139  K 3.4* 2.8* 3.8 3.4* 3.9  CL 99 107  --  110 108  CO2 19* 21*  --  21* 23  GLUCOSE 133* 98  --  133* 139*  BUN 6* <5*  --  5* 5*  CREATININE 0.79 0.50  --  0.60 0.58  CALCIUM 9.2 8.3*  --  8.7* 9.0  MG  --  1.7  --  1.8  --    GFR: Estimated Creatinine Clearance: 61.7 mL/min (by C-G formula based on SCr of 0.58 mg/dL). Liver Function Tests: Recent Labs  Lab 09/18/20 1114 09/19/20 0414 09/20/20 0456 09/21/20 0435  AST 82* 41 32 31  ALT 224* 132* 105* 86*  ALKPHOS 192* 140* 122 130*  BILITOT 1.6* 1.1 1.0 0.6  PROT 8.2* 6.2* 7.0 7.1  ALBUMIN 3.8 3.0* 3.2* 3.1*   Recent Labs  Lab 09/18/20 1114  LIPASE 27   No results for input(s): AMMONIA in the last 168 hours. Coagulation Profile: No results for input(s): INR, PROTIME in the last 168 hours. Cardiac Enzymes: No results for input(s): CKTOTAL, CKMB, CKMBINDEX, TROPONINI in the last 168 hours. BNP (last 3 results) No results for input(s): PROBNP in the last 8760 hours. HbA1C: No results for input(s): HGBA1C in the last 72 hours. CBG: No results for input(s): GLUCAP in the last 168 hours. Lipid Profile: No results for input(s): CHOL, HDL, LDLCALC, TRIG, CHOLHDL, LDLDIRECT in the last 72 hours. Thyroid Function Tests: Recent Labs    09/19/20 0414 09/19/20 1426  TSH 3.700  --   FREET4 1.58*  --   T3FREE  --   3.2   Anemia Panel: No results for input(s): VITAMINB12, FOLATE, FERRITIN, TIBC, IRON, RETICCTPCT in the last 72 hours. Sepsis Labs: No results for input(s): PROCALCITON, LATICACIDVEN in the last 168 hours.  Recent Results (from the past 240 hour(s))  Urine Culture     Status: Abnormal (Preliminary result)   Collection Time: 09/18/20  2:17 PM   Specimen: Urine, Random  Result Value Ref Range Status   Specimen Description   Final    URINE, RANDOM Performed at [facility], [address], [place], [place] [address]    Special Requests   Final    NONE Performed at [facility], [address] [place], [place] [address]    Culture (A)  Final    >=100,000  COLONIES/mL ENTEROBACTER AEROGENES SUSCEPTIBILITIES TO FOLLOW Performed at [facility] [address] [place], [place] [address]    Report Status PENDING  Incomplete  Resp Panel by RT-PCR (Flu A&B, Covid) Nasopharyngeal Swab     Status: None   Collection Time: 09/18/20  5:40 PM   Specimen: Nasopharyngeal Swab; Nasopharyngeal(NP) swabs in vial transport medium  Result Value Ref Range Status   SARS Coronavirus 2 by RT PCR NEGATIVE NEGATIVE Final    Comment: (NOTE) SARS-CoV-2 target nucleic acids are NOT DETECTED.  The SARS-CoV-2 RNA is generally detectable in upper respiratory specimens during the acute phase of infection. The lowest concentration of SARS-CoV-2 viral copies this assay can detect is 138 copies/mL. A negative result does not preclude SARS-Cov-2 infection and should not be used as the sole basis for treatment or other patient management decisions. A negative result may occur with  improper specimen collection/handling, submission of specimen other than nasopharyngeal swab, presence of viral mutation(s) within the areas targeted by this assay, and inadequate number of viral copies(<138 copies/mL). A negative result must be combined with clinical observations, patient history, and  epidemiological information. The expected result is Negative.  Fact Sheet for Patients:  EntrepreneurPulse.com.au  Fact Sheet for Healthcare Providers:  IncredibleEmployment.be  This test is no t yet approved or cleared by the [place] FDA and  has been authorized for detection and/or diagnosis of SARS-CoV-2 by FDA under an Emergency Use Authorization (EUA). This EUA will remain  in effect (meaning this test can be used) for the duration of the COVID-19 declaration under Section 564(b)(1) of the Act, 21 U.S.C.section 360bbb-3(b)(1), unless the authorization is terminated  or revoked sooner.       Influenza A by PCR NEGATIVE NEGATIVE Final   Influenza B by PCR NEGATIVE NEGATIVE Final    Comment: (NOTE) The Xpert Xpress SARS-CoV-2/FLU/RSV plus assay is intended as an aid in the diagnosis of influenza from Nasopharyngeal swab specimens and should not be used as a sole basis for treatment. Nasal washings and aspirates are unacceptable for Xpert Xpress SARS-CoV-2/FLU/RSV testing.  Fact Sheet for Patients: EntrepreneurPulse.com.au  Fact Sheet for Healthcare Providers: IncredibleEmployment.be  This test is not yet approved or cleared by the [place] FDA and has been authorized for detection and/or diagnosis of SARS-CoV-2 by FDA under an Emergency Use Authorization (EUA). This EUA will remain in effect (meaning this test can be used) for the duration of the COVID-19 declaration under Section 564(b)(1) of the Act, 21 U.S.C. section 360bbb-3(b)(1), unless the authorization is terminated or revoked.  Performed at [facility], [address] [place], [place] [address]          Radiology Studies: DG C-Arm 1-60 Min-No Report  Result Date: 09/20/2020 Fluoroscopy was utilized by the requesting physician.  No radiographic interpretation.        Scheduled Meds: . atorvastatin  40 mg  Oral QHS  . famotidine  40 mg Oral Daily  . indocyanine green  2.5 mg Intravenous 60 min Pre-Op  . metoprolol succinate  50 mg Oral Daily  . pantoprazole  40 mg Oral Daily  . potassium chloride  20 mEq Oral BID  . Vilazodone HCl  40 mg Oral Daily   Continuous Infusions: .  ceFAZolin (ANCEF) IV    . cefTRIAXone (ROCEPHIN)  IV 1 g (09/20/20 1350)     LOS: 3 days    Time spent: 30 minutes  Lorella Nimrod, MD [facility] This record has been created using  [employer] forces training and education officer. Errors have been sought and corrected,but may not always be located. Such creation errors do not reflect on the standard of care.   To contact the attending provider between 7A-7P or the covering provider during after hours 7P-7A, please log into the web site [email or web address] and access using universal [place] password for that web site. If you do not have the password, please call the hospital operator.  09/21/2020, 7:42 AM

## 2020-09-22 ENCOUNTER — Encounter: Payer: Self-pay | Admitting: Gastroenterology

## 2020-09-22 LAB — SURGICAL PATHOLOGY

## 2020-09-22 MED ORDER — OXYCODONE HCL 5 MG PO TABS: 5.0000 mg | ORAL_TABLET | ORAL | 0 refills | Status: DC | PRN

## 2020-09-22 MED ORDER — POLYETHYLENE GLYCOL 3350 17 G PO PACK: 17.0000 g | PACK | Freq: Every day | ORAL | 0 refills | Status: DC | PRN

## 2020-09-22 NOTE — Discharge Instructions (Signed)
In addition to included general post-operative instructions,  Diet: Resume home diet. Recommend avoiding or limiting fatty/greasy foods over the next few days/week. If you do eat these, you may (or may not) notice diarrhea. This is expected while your body adjusts to not having a gallbladder, and it typically resolves with time.   Activity: No heavy lifting >20 pounds (children, pets, laundry, garbage) for 4 weeks, but light activity and walking are encouraged. Do not drive or drink alcohol if taking narcotic pain medications or having pain that might distract from driving.  Wound care: 2 days after surgery (04/28), you may shower/get incision wet with soapy water and pat dry (do not rub incisions), but no baths or submerging incision underwater until follow-up.   Medications: Resume all home medications. For mild to moderate pain: acetaminophen (Tylenol) or ibuprofen/naproxen (if no kidney disease). Combining Tylenol with alcohol can substantially increase your risk of causing liver disease. Narcotic pain medications, if prescribed, can be used for severe pain, though may cause nausea, constipation, and drowsiness. Do not combine Tylenol and Percocet (or similar) within a 6 hour period as Percocet (and similar) contain(s) Tylenol. If you do not need the narcotic pain medication, you do not need to fill the prescription.  Call office [phone] / [phone]) at any time if any questions, worsening pain, fevers/chills, bleeding, drainage from incision site, or other concerns.

## 2020-09-22 NOTE — Discharge Summary (Signed)
Discharge Summary  Wanda [name] Trembley RUE:[id number] DOB: 04-26-1956  PCP: Orene DesanctisBehling, Karen, MD  Admit date: 09/18/2020 Discharge date: 09/22/2020  Time spent: 30mins  Recommendations for Outpatient Follow-up:  1. F/u with PCP within a week  for hospital discharge follow up, repeat cbc/bmp at follow up 2. F/u with general surgery for post op follow up   Discharge Diagnoses:  Active Hospital Problems   Diagnosis Date Noted  . Choledocholithiasis 09/18/2020  . Acute lower UTI 09/19/2020  . Hepatic cyst 09/19/2020  . GAD (generalized anxiety disorder) 11/21/2018  . Essential hypertension 07/08/2018  . Tachycardia 04/11/2017  . Hypokalemia 12/11/2008  . Anxiety and depression 12/11/2008  . Gastroesophageal reflux disease 12/11/2008    Resolved Hospital Problems  No resolved problems to display.    Discharge Condition: stable  Diet recommendation: heart healthy  Filed Weights   09/18/20 1107 09/21/20 0936  Weight: 65.8 kg 65.8 kg    History of present illness: ( per admitting provider Dr Benita Gutteru, Ching) Chief Complaint: Abdominal pain  HPI: Wanda [name] Seidl is a 65 y.o. female with medical history significant for chronic pain, migraine, paroxysmal SVT, hypertension, hyperlipidemia, anxiety, depression, PTSD, prediabetes and GERD who presents with concerns of abdominal pain.  Starting yesterday she developed acute right upper quadrant abdominal pain that is constant and crampy in nature with associated diarrhea and nausea.  No aggravating or alleviating factors.  It does not correlate with food.  No objective fever but has been diaphoretic.  She also noticed some urinary dribbling/hesitancy. Denies tobacco, alcohol or illicit drug use.  ED Course: She was afebrile, tachycardic and normotensive on room air. CBC unremarkable without leukocytosis. K of 3.4.  AST of 82.  ALT of 224.  Alkaline phosphatase 192.  Total bilirubin 1.6.  UA positive with moderate leukocyte, negative  nitrite and greater than 50 WBC.  MRCP showing mild intra and extrahepatic biliary ductal dilatation and choledocholithiasis.  There are also cystic hepatic lesions possibly concerning for hepatic abscess.  ED physician Dr. Fuller PlanFunke discussed case with GI at [facility] but no ERCP done there over the weekend. GI Dr. Tobi BastosAnna was consulted and Dr. Servando SnareWohl to take for ERCP in the morning.  Hospital Course:  Principal Problem:   Choledocholithiasis Active Problems:   Hypokalemia   Anxiety and depression   Gastroesophageal reflux disease   Tachycardia   Essential hypertension   GAD (generalized anxiety disorder)   Acute lower UTI   Hepatic cyst  Choledocholithiasis without cholangitis S/p ERCP on 4/25 with sphincterotomy and removal of 2 stones s/p robotic assisted laparoscopic cholecystectomy on 4/26 Is much improved, no pain, lft trending down, tolerating regular diet, she is cleared to discharge home by general surgery She is to follow-up with general surgery in 1 to 2 weeks  Thyromegaly/Sinus tachycardia/ PSVT. Patient has diffuse enlargement of the thyroid, she has been having tachycardia for a year, she had a Holter monitor recently, showed PSVT.  She also had intermittent diarrhea.  She also has significant anxiety.  Most likely she has hyperthyroidism.  However, her TSH is normal, free T4 is elevated.    T3 and free T3 unremarkable  -Continue with metoprolol, follow-up with PCP  UTI.  Urine cultures growing Enterobacter aerugenes-good sensitivity except resistance to cefazolin and nitrofurantoin. -finished ceftriaxone x5-day course in the hospital  Hepatic cyst. MRCP suspect patient may have a liver abscess.  However, CT scan and a right upper quadrant ultrasound appear to be a simple cyst. F/u with pcp and gi  Hypokalemia.     Replaced ,resolved  Hypertension Continue metoprolol  Hyperlipidemia Continue statin  Anxiety Stable on home medication,  continue    Procedures:  ERCP  Lap chole  Consultations:  GI  General surgery  Discharge Exam: BP 109/66 (BP Location: Right Arm)   Pulse 83   Temp 98.1 F (36.7 C) (Oral)   Resp 18   Ht 5\' 1"  (1.549 [name])   Wt 65.8 kg   LMP 09/30/1991 (Approximate) Comment: 1993  SpO2 97%   BMI 27.40 kg/[name]   General: NAD Cardiovascular: RRR Respiratory: Normal respiratory effort  Discharge Instructions You were cared for by a hospitalist during your hospital stay. If you have any questions about your discharge medications or the care you received while you were in the hospital after you are discharged, you can call the unit and asked to speak with the hospitalist on call if the hospitalist that took care of you is not available. Once you are discharged, your primary care physician will handle any further medical issues. Please note that NO REFILLS for any discharge medications will be authorized once you are discharged, as it is imperative that you return to your primary care physician (or establish a relationship with a primary care physician if you do not have one) for your aftercare needs so that they can reassess your need for medications and monitor your lab values.  Discharge Instructions    Diet - low sodium heart healthy   Complete by: As directed    Increase activity slowly   Complete by: As directed      Allergies as of 09/22/2020      Reactions   Levofloxacin Hives, Nausea And Vomiting   Tramadol Other (See Comments)   Severe headache   Codeine Nausea Only   Slight nausea   Mobic [meloxicam] Other (See Comments)   Calf tightness   Propoxyphene Hcl Rash      Medication List    TAKE these medications   atorvastatin 40 MG tablet Commonly known as: LIPITOR Take 40 mg by mouth daily. PM   cetirizine 10 MG tablet Commonly known as: ZYRTEC Take 10 mg by mouth daily.   dexlansoprazole 60 MG capsule Commonly known as: DEXILANT Take 60 mg by mouth daily.   famotidine  40 MG tablet Commonly known as: PEPCID Take 40 mg by mouth daily.   hydrOXYzine 25 MG capsule Commonly known as: VISTARIL TAKE 1 CAPSULE BY MOUTH THREE TIMES DAILY AS NEEDED FOR SEVERE ANXIETY   LORazepam 1 MG tablet Commonly known as: ATIVAN Take 0.5 tablets (0.5 mg total) by mouth as directed. Take half tablet ( 0.5 mg) daily once as needed for severe anxiety attacks only.   metoprolol succinate 25 MG 24 hr tablet Commonly known as: TOPROL-XL TAKE 1 TABLET(25 MG) BY MOUTH DAILY   multivitamin tablet Take 1 tablet by mouth daily.   Naloxone HCl 0.4 MG/0.4ML Soaj Take if having symptoms of opioid overdose including extreme sleepiness or stopping breathing   oxyCODONE 5 MG immediate release tablet Commonly known as: Oxy IR/ROXICODONE Take 1 tablet (5 mg total) by mouth every 4 (four) hours as needed for severe pain.   polyethylene glycol 17 g packet Commonly known as: MiraLax Take 17 g by mouth daily as needed for moderate constipation.   potassium chloride 20 MEQ packet Commonly known as: KLOR-CON Take 20 mEq by mouth 2 (two) times daily.   Viibryd 40 MG Tabs Generic drug: Vilazodone HCl DAILY   Vitamin D3  125 MCG (5000 UT) Tabs Take 5,000 Units by mouth daily.   zaleplon 5 MG capsule Commonly known as: Sonata Take 1 capsule (5 mg total) by mouth at bedtime as needed for sleep.      Allergies  Allergen Reactions  . Levofloxacin Hives and Nausea And Vomiting  . Tramadol Other (See Comments)    Severe headache  . Codeine Nausea Only    Slight nausea  . Mobic [Meloxicam] Other (See Comments)    Calf tightness  . Propoxyphene Hcl Rash    Follow-up Information    Tylene Fantasia, PA-C. Go on 10/06/2020.   Specialty: Physician Assistant Why:  2pm appointment Contact information: [address] [place] [address] [phone]        Barbaraann Boys, MD. Daphane Shepherd on 09/30/2020.   Specialty: Pediatrics Why: 8am appointment Contact information: [place] [address] [phone]        Nelva Bush, MD. Go on 10/04/2020.   Specialty: Cardiology Why: 10:30 Contact information: [place] [address] [phone]                The results of significant diagnostics from this hospitalization (including imaging, microbiology, ancillary and laboratory) are listed below for reference.    Significant Diagnostic Studies: CT ABDOMEN PELVIS W CONTRAST  Result Date: 09/18/2020 CLINICAL DATA:  Abdominal distension, choledochal lithiasis seen on prior MRCP. EXAM: CT ABDOMEN AND PELVIS WITH CONTRAST TECHNIQUE: Multidetector CT imaging of the abdomen and pelvis was performed using the standard protocol following bolus administration of intravenous contrast. CONTRAST:  192mL OMNIPAQUE IOHEXOL 300 MG/ML  SOLN COMPARISON:  MRCP dated 09/18/2020. FINDINGS: Lower chest: No acute abnormality. Hepatobiliary: A hypoattenuating lesion in the left hepatic lobe measures 1.7 cm and demonstrated internal debris and rim enhancement on same day MRCP (series 2, image 20). A 9 mm benign cyst is seen in the right hepatic lobe (series 2, image 22). A few additional lesions identified on MRCP are less well seen on this exam. No gallbladder wall thickening. The common bile duct is mildly dilated, measuring up to 6 mm. The patient's known cholelithiasis and choledocholithiasis is not well seen on this exam. Pancreas: Unremarkable. No pancreatic ductal dilatation or surrounding inflammatory changes. Spleen: A 4 mm hypoattenuating lesion in the spleen is consistent with a benign cyst. Adrenals/Urinary Tract: Adrenal glands are unremarkable. Bilateral renal cysts measure up to 7 mm. Nonobstructive renal calculi measure 2 mm, one in each kidney. No hydronephrosis. Bladder is unremarkable. Stomach/Bowel: Stomach is within normal limits. No pericecal inflammatory changes are noted to suggest acute appendicitis. No evidence of bowel  wall thickening, distention, or inflammatory changes. Vascular/Lymphatic: No significant vascular findings are present. No enlarged abdominal or pelvic lymph nodes. Reproductive: Status post hysterectomy. No adnexal masses. Other: No abdominal wall hernia or abnormality. No abdominopelvic ascites. Musculoskeletal: Degenerative changes are seen in the spine. The patient is status post internal fixation of the proximal left femur. There is a chronic appearing compression deformity of L1. IMPRESSION: 1. Known cholelithiasis and choledocholithiasis better characterized on same day MRCP. The common bile duct is mildly dilated, measuring up to 6 mm. 2. Multiple hepatic lesions are better characterized on same day MRCP. Aortic Atherosclerosis (ICD10-I70.0). Electronically Signed   By: Zerita Boers [name].D.   On: 09/18/2020 18:42   MR 3D Recon At Scanner  Result Date: 09/18/2020 CLINICAL DATA:  Nausea vomiting and diarrhea with laboratory signs of biliary obstruction. EXAM: MRI ABDOMEN  WITHOUT AND WITH CONTRAST (INCLUDING MRCP) TECHNIQUE: Multiplanar multisequence MR imaging of the abdomen was performed both before and after the administration of intravenous contrast. Heavily T2-weighted images of the biliary and pancreatic ducts were obtained, and three-dimensional MRCP images were rendered by post processing. CONTRAST:  33mL GADAVIST GADOBUTROL 1 MMOL/ML IV SOLN COMPARISON:  Same day ultrasound. FINDINGS: Lower chest: No acute findings. Hepatobiliary: No hepatic steatosis. There are at least 3 cystic rim enhancing lesions in the liver the largest of which is in the anterior left lobe of the liver measuring 1.7 cm on image 36/17 with that cysts demonstrating some internal debris, a smaller rim enhancing cystic lesion is visualized in the right lobe of the liver measuring 6 mm on image 34/22 with a similar appearing smaller 3 mm lesion in the lateral left lobe of the liver on image 33/22. There is a wedge-shaped area of  increased arterial enhancement along the falciform ligament which surrounds the larger lesion as well as some increased T2 signal in this area suggestive of hepatic edema. There are 2 nonenhancing simple cyst in the right lobe of the liver the largest of which measures 9 mm. Cholelithiasis measuring up to 5 mm without pericholecystic fluid or gallbladder wall thickening. There is mild intra and extrahepatic biliary ductal dilation with the common duct measuring 7 mm on image 10/10. There are 2 small choledocholithiasis in the distal common duct measuring up to 4 mm on image 16/3. There is no peribiliary enhancement. Pancreas: No mass, inflammatory changes, or other parenchymal abnormality identified. Spleen: There is a nonenhancing 4 mm cystic lesion in the spleen, likely a benign cyst or lymphangioma. Adrenals/Urinary Tract: Bilateral adrenal glands are unremarkable. No hydronephrosis. Small bilateral cysts measuring up to 7 mm in the left kidney. No solid enhancing renal lesions. Stomach/Bowel: Visualized portions within the abdomen are unremarkable. Vascular/Lymphatic: Aortic atherosclerosis without aneurysmal dilation. Other:  None. Musculoskeletal: No suspicious bone lesions identified. IMPRESSION: 1. There is mild intra and extrahepatic biliary ductal dilation with 2 small choledocholithiasis in the distal common duct measuring up to 4 mm. No peribiliary enhancement. 2. Rim enhancing cystic hepatic lesions measuring up to 1.7 cm as well as 2 smaller subcentimeter lesions, with this larger lesion demonstrating some internal complexity with associated hepatic edema, findings which are nonspecific but suspicious for hepatic abscess/micro abscesses within alternate diagnosis including biliary cystadenoma. Recommend correlation with laboratory values for signs of infection. There are no MRI findings to suggest ascending cholangitis, as such recommend CT abdomen and pelvis to assess for a non biliary source of  infection if continued clinical concern for infection. 3. Cholelithiasis without evidence of acute cholecystitis. These results were called by telephone at the time of interpretation on 09/18/2020 at 5:19 pm to provider Dr. Nickolas Madrid, who verbally acknowledged these results. Electronically Signed   By: Dahlia Bailiff MD   On: 09/18/2020 17:21   DG C-Arm 1-60 Min-No Report  Result Date: 09/20/2020 Fluoroscopy was utilized by the requesting physician.  No radiographic interpretation.   MR ABDOMEN MRCP W WO CONTAST  Result Date: 09/18/2020 CLINICAL DATA:  Nausea vomiting and diarrhea with laboratory signs of biliary obstruction. EXAM: MRI ABDOMEN WITHOUT AND WITH CONTRAST (INCLUDING MRCP) TECHNIQUE: Multiplanar multisequence MR imaging of the abdomen was performed both before and after the administration of intravenous contrast. Heavily T2-weighted images of the biliary and pancreatic ducts were obtained, and three-dimensional MRCP images were rendered by post processing. CONTRAST:  70mL GADAVIST GADOBUTROL 1 MMOL/ML IV SOLN COMPARISON:  Same  day ultrasound. FINDINGS: Lower chest: No acute findings. Hepatobiliary: No hepatic steatosis. There are at least 3 cystic rim enhancing lesions in the liver the largest of which is in the anterior left lobe of the liver measuring 1.7 cm on image 36/17 with that cysts demonstrating some internal debris, a smaller rim enhancing cystic lesion is visualized in the right lobe of the liver measuring 6 mm on image 34/22 with a similar appearing smaller 3 mm lesion in the lateral left lobe of the liver on image 33/22. There is a wedge-shaped area of increased arterial enhancement along the falciform ligament which surrounds the larger lesion as well as some increased T2 signal in this area suggestive of hepatic edema. There are 2 nonenhancing simple cyst in the right lobe of the liver the largest of which measures 9 mm. Cholelithiasis measuring up to 5 mm without pericholecystic fluid  or gallbladder wall thickening. There is mild intra and extrahepatic biliary ductal dilation with the common duct measuring 7 mm on image 10/10. There are 2 small choledocholithiasis in the distal common duct measuring up to 4 mm on image 16/3. There is no peribiliary enhancement. Pancreas: No mass, inflammatory changes, or other parenchymal abnormality identified. Spleen: There is a nonenhancing 4 mm cystic lesion in the spleen, likely a benign cyst or lymphangioma. Adrenals/Urinary Tract: Bilateral adrenal glands are unremarkable. No hydronephrosis. Small bilateral cysts measuring up to 7 mm in the left kidney. No solid enhancing renal lesions. Stomach/Bowel: Visualized portions within the abdomen are unremarkable. Vascular/Lymphatic: Aortic atherosclerosis without aneurysmal dilation. Other:  None. Musculoskeletal: No suspicious bone lesions identified. IMPRESSION: 1. There is mild intra and extrahepatic biliary ductal dilation with 2 small choledocholithiasis in the distal common duct measuring up to 4 mm. No peribiliary enhancement. 2. Rim enhancing cystic hepatic lesions measuring up to 1.7 cm as well as 2 smaller subcentimeter lesions, with this larger lesion demonstrating some internal complexity with associated hepatic edema, findings which are nonspecific but suspicious for hepatic abscess/micro abscesses within alternate diagnosis including biliary cystadenoma. Recommend correlation with laboratory values for signs of infection. There are no MRI findings to suggest ascending cholangitis, as such recommend CT abdomen and pelvis to assess for a non biliary source of infection if continued clinical concern for infection. 3. Cholelithiasis without evidence of acute cholecystitis. These results were called by telephone at the time of interpretation on 09/18/2020 at 5:19 pm to provider Dr. Alfred Levins, who verbally acknowledged these results. Electronically Signed   By: Maudry Mayhew MD   On: 09/18/2020 17:21   US  ABDOMEN LIMITED RUQ (LIVER/GB)  Result Date: 09/18/2020 CLINICAL DATA:  Right upper quadrant pain for 2 days, elevated liver function tests. EXAM: ULTRASOUND ABDOMEN LIMITED RIGHT UPPER QUADRANT COMPARISON:  None. FINDINGS: Gallbladder: A mobile 1.1 cm gallstone is noted. No wall thickening visualized. No sonographic Murphy sign noted by sonographer. Common bile duct: Diameter: 3 mm Liver: A 1 cm cyst is seen in the right hepatic lobe. Within normal limits in parenchymal echogenicity. Portal vein is patent on color Doppler imaging with normal direction of blood flow towards the liver. Other: None. IMPRESSION: Cholelithiasis without evidence of acute cholecystitis. Electronically Signed   By: Romona Curls [name].D.   On: 09/18/2020 12:56    Microbiology: Recent Results (from the past 240 hour(s))  Urine Culture     Status: Abnormal   Collection Time: 09/18/20  2:17 PM   Specimen: Urine, Random  Result Value Ref Range Status   Specimen Description  Final    URINE, RANDOM Performed at [facility], [address] [place], [place] [address]    Special Requests   Final    NONE Performed at [facility], [place], [place] [address]    Culture >=100,000 COLONIES/mL ENTEROBACTER AEROGENES (A)  Final   Report Status 09/21/2020 FINAL  Final   Organism ID, Bacteria ENTEROBACTER AEROGENES (A)  Final      Susceptibility   Enterobacter aerogenes - MIC*    CEFAZOLIN >=64 RESISTANT Resistant     CEFEPIME <=0.12 SENSITIVE Sensitive     CEFTRIAXONE <=0.25 SENSITIVE Sensitive     CIPROFLOXACIN <=0.25 SENSITIVE Sensitive     GENTAMICIN <=1 SENSITIVE Sensitive     IMIPENEM 2 SENSITIVE Sensitive     NITROFURANTOIN 64 INTERMEDIATE Intermediate     TRIMETH/SULFA <=20 SENSITIVE Sensitive     PIP/TAZO <=4 SENSITIVE Sensitive     * >=100,000 COLONIES/mL ENTEROBACTER AEROGENES  Resp Panel by RT-PCR (Flu A&B, Covid) Nasopharyngeal Swab     Status: None   Collection Time:  09/18/20  5:40 PM   Specimen: Nasopharyngeal Swab; Nasopharyngeal(NP) swabs in vial transport medium  Result Value Ref Range Status   SARS Coronavirus 2 by RT PCR NEGATIVE NEGATIVE Final    Comment: (NOTE) SARS-CoV-2 target nucleic acids are NOT DETECTED.  The SARS-CoV-2 RNA is generally detectable in upper respiratory specimens during the acute phase of infection. The lowest concentration of SARS-CoV-2 viral copies this assay can detect is 138 copies/mL. A negative result does not preclude SARS-Cov-2 infection and should not be used as the sole basis for treatment or other patient management decisions. A negative result may occur with  improper specimen collection/handling, submission of specimen other than nasopharyngeal swab, presence of viral mutation(s) within the areas targeted by this assay, and inadequate number of viral copies(<138 copies/mL). A negative result must be combined with clinical observations, patient history, and epidemiological information. The expected result is Negative.  Fact Sheet for Patients:  EntrepreneurPulse.com.au  Fact Sheet for Healthcare Providers:  IncredibleEmployment.be  This test is no t yet approved or cleared by the [place] FDA and  has been authorized for detection and/or diagnosis of SARS-CoV-2 by FDA under an Emergency Use Authorization (EUA). This EUA will remain  in effect (meaning this test can be used) for the duration of the COVID-19 declaration under Section 564(b)(1) of the Act, 21 U.S.C.section 360bbb-3(b)(1), unless the authorization is terminated  or revoked sooner.       Influenza A by PCR NEGATIVE NEGATIVE Final   Influenza B by PCR NEGATIVE NEGATIVE Final    Comment: (NOTE) The Xpert Xpress SARS-CoV-2/FLU/RSV plus assay is intended as an aid in the diagnosis of influenza from Nasopharyngeal swab specimens and should not be used as a sole basis for treatment. Nasal washings  and aspirates are unacceptable for Xpert Xpress SARS-CoV-2/FLU/RSV testing.  Fact Sheet for Patients: EntrepreneurPulse.com.au  Fact Sheet for Healthcare Providers: IncredibleEmployment.be  This test is not yet approved or cleared by the [place] FDA and has been authorized for detection and/or diagnosis of SARS-CoV-2 by FDA under an Emergency Use Authorization (EUA). This EUA will remain in effect (meaning this test can be used) for the duration of the COVID-19 declaration under Section 564(b)(1) of the Act, 21 U.S.C. section 360bbb-3(b)(1), unless the authorization is terminated or revoked.  Performed at [facility], [address], [place], [place] [address]      Labs: Basic Metabolic Panel: Recent Labs  Lab 09/18/20 1114 09/19/20 0414 09/19/20 1426 09/20/20 0456 09/21/20 0435  NA 135 138  --  140 139  K 3.4* 2.8* 3.8 3.4* 3.9  CL 99 107  --  110 108  CO2 19* 21*  --  21* 23  GLUCOSE 133* 98  --  133* 139*  BUN 6* <5*  --  5* 5*  CREATININE 0.79 0.50  --  0.60 0.58  CALCIUM 9.2 8.3*  --  8.7* 9.0  MG  --  1.7  --  1.8  --    Liver Function Tests: Recent Labs  Lab 09/18/20 1114 09/19/20 0414 09/20/20 0456 09/21/20 0435  AST 82* 41 32 31  ALT 224* 132* 105* 86*  ALKPHOS 192* 140* 122 130*  BILITOT 1.6* 1.1 1.0 0.6  PROT 8.2* 6.2* 7.0 7.1  ALBUMIN 3.8 3.0* 3.2* 3.1*   Recent Labs  Lab 09/18/20 1114  LIPASE 27   No results for input(s): AMMONIA in the last 168 hours. CBC: Recent Labs  Lab 09/18/20 1114 09/19/20 0414 09/20/20 0456 09/21/20 0435  WBC 10.3 8.4 7.1 8.9  NEUTROABS  --   --  4.5  --   HGB 14.4 11.1* 12.0 12.1  HCT 41.1 32.2* 34.0* 35.1*  MCV 90.7 90.7 91.9 91.6  PLT 173 150 180 238   Cardiac Enzymes: No results for input(s): CKTOTAL, CKMB, CKMBINDEX, TROPONINI in the last 168 hours. BNP: BNP (last 3 results) No results for input(s): BNP in the last 8760 hours.  ProBNP (last 3  results) No results for input(s): PROBNP in the last 8760 hours.  CBG: No results for input(s): GLUCAP in the last 168 hours.     Signed:  Florencia Reasons MD, PhD, FACP  [facility] 09/22/2020, 10:29 AM

## 2020-09-22 NOTE — Progress Notes (Signed)
Patient awaiting ride to home via son. Patient given prescription information and discharge instructions. Patient verbalized understanding and able to teach back instructions.  IV sites d/ced. Patient to be discharged with all belongings.  No acute distress noted.

## 2020-09-22 NOTE — Progress Notes (Signed)
[place] [facility] Day(s): 4.   Post op day(s): 1 Day Post-Op.   Interval History:  Patient seen and examined No acute events or new complaints overnight.  Patient reports she is doing well; incisional soreness reported No fever, chills, nausea, emesis No new labs or imaging this morning She has been advanced to regular diet; tolerating well Ambulating without difficulty  Vital signs in last 24 hours: [min-max] current  Temp:  [97.7 F (36.5 C)-98.7 F (37.1 C)] 98.3 F (36.8 C) (04/27 0513) Pulse Rate:  [70-100] 100 (04/27 0513) Resp:  [11-18] 18 (04/27 0513) BP: (96-140)/(54-84) 111/64 (04/27 0513) SpO2:  [91 %-100 %] 96 % (04/27 0513) Weight:  [65.8 kg] 65.8 kg (04/26 0936)     Height: 5\' 1"  (154.9 cm) Weight: 65.8 kg BMI (Calculated): 27.41   Intake/Output last 2 shifts:  04/26 0701 - 04/27 0700 In: 1500.1 [P.O.:300; I.V.:900; IV Piggyback:300.1] Out: 10 [Blood:10]   Physical Exam:  Constitutional: alert, cooperative and no distress  Respiratory: breathing non-labored at rest  Cardiovascular: regular rate and sinus rhythm  Gastrointestinal: Soft, expected incisional soreness, and non-distended. No rebound/guarding Integumentary: Laparoscopic incisions are CDI with dermabond, no erythema or drainage   Labs:  CBC Latest Ref Rng & Units 09/21/2020 09/20/2020 09/19/2020  WBC 4.0 - 10.5 K/uL 8.9 7.1 8.4  Hemoglobin 12.0 - 15.0 g/dL 12.1 12.0 11.1(L)  Hematocrit 36.0 - 46.0 % 35.1(L) 34.0(L) 32.2(L)  Platelets 150 - 400 K/uL 238 180 150   CMP Latest Ref Rng & Units 09/21/2020 09/20/2020 09/19/2020  Glucose 70 - 99 mg/dL 139(H) 133(H) -  BUN 8 - 23 mg/dL 5(L) 5(L) -  Creatinine 0.44 - 1.00 mg/dL 0.58 0.60 -  Sodium 135 - 145 mmol/L 139 140 -  Potassium 3.5 - 5.1 mmol/L 3.9 3.4(L) 3.8  Chloride 98 - 111 mmol/L 108 110 -  CO2 22 - 32 mmol/L 23 21(L) -  Calcium 8.9 - 10.3 mg/dL 9.0 8.7(L) -  Total Protein 6.5 - 8.1 g/dL 7.1 7.0 -   Total Bilirubin 0.3 - 1.2 mg/dL 0.6 1.0 -  Alkaline Phos 38 - 126 U/L 130(H) 122 -  AST 15 - 41 U/L 31 32 -  ALT 0 - 44 U/L 86(H) 105(H) -      Imaging studies: No new pertinent imaging studies   Assessment/Plan: 65 y.o. female overall doing well 1 Day Post-Op s/p robotic assisted laparoscopic cholecystectomy for choledocholithiasis.   - Okay for regular diet; recommend limiting/avoiding fatty food for a few days post-operatively  - Monitor abdominal examination; on-going bowel function   - Pain control prn; antiemetics prn  - Mobilization as tolerated; reviewed post-operative lifting restrictions   - Further per primary service    - Okay for discharge from surgical perspective; will update discharge instructions and restrictions; follow up in 2 weeks    All of the above findings and recommendations were discussed with the patient, and the medical team, and all of patient's questions were answered to her expressed satisfaction.  -- Edison Simon, PA-C [facility] 09/22/2020, 7:18 AM [phone] M-F: 7am - 4pm

## 2020-09-22 NOTE — Plan of Care (Signed)
Axox4. Calma nd cooperative and able to voice her needs. Prn pain med adm for abdominal pain prn dilaudid adm and effective. Vitals stable. Will continue to monitor.  Problem: Education: Goal: Knowledge of General Education information will improve Description: Including pain rating scale, medication(s)/side effects and non-pharmacologic comfort measures Outcome: Progressing   Problem: Health Behavior/Discharge Planning: Goal: Ability to manage health-related needs will improve Outcome: Progressing   Problem: Clinical Measurements: Goal: Ability to maintain clinical measurements within normal limits will improve Outcome: Progressing Goal: Will remain free from infection Outcome: Progressing Goal: Diagnostic test results will improve Outcome: Progressing Goal: Respiratory complications will improve Outcome: Progressing Goal: Cardiovascular complication will be avoided Outcome: Progressing   Problem: Activity: Goal: Risk for activity intolerance will decrease Outcome: Progressing   Problem: Nutrition: Goal: Adequate nutrition will be maintained Outcome: Progressing   Problem: Coping: Goal: Level of anxiety will decrease Outcome: Progressing   Problem: Elimination: Goal: Will not experience complications related to bowel motility Outcome: Progressing Goal: Will not experience complications related to urinary retention Outcome: Progressing   Problem: Pain Managment: Goal: General experience of comfort will improve Outcome: Progressing   Problem: Safety: Goal: Ability to remain free from injury will improve Outcome: Progressing   Problem: Skin Integrity: Goal: Risk for impaired skin integrity will decrease Outcome: Progressing

## 2020-09-24 ENCOUNTER — Other Ambulatory Visit: Payer: Self-pay | Admitting: Psychiatry

## 2020-09-24 DIAGNOSIS — F411 Generalized anxiety disorder: Secondary | ICD-10-CM

## 2020-09-26 HISTORY — PX: CHOLECYSTECTOMY: SHX55

## 2020-09-28 ENCOUNTER — Other Ambulatory Visit: Payer: Medicare Other

## 2020-09-30 ENCOUNTER — Telehealth: Payer: Medicare Other | Admitting: Psychiatry

## 2020-09-30 ENCOUNTER — Telehealth: Payer: Self-pay

## 2020-09-30 DIAGNOSIS — F411 Generalized anxiety disorder: Secondary | ICD-10-CM

## 2020-09-30 DIAGNOSIS — K9089 Other intestinal malabsorption: Secondary | ICD-10-CM | POA: Insufficient documentation

## 2020-09-30 DIAGNOSIS — F5101 Primary insomnia: Secondary | ICD-10-CM

## 2020-09-30 DIAGNOSIS — Z9049 Acquired absence of other specified parts of digestive tract: Secondary | ICD-10-CM | POA: Insufficient documentation

## 2020-09-30 NOTE — Telephone Encounter (Signed)
Please let her know I do not recommend staying on hydroxyzine due to recent cardiac problems.  She can continue the lorazepam for now.

## 2020-09-30 NOTE — Telephone Encounter (Signed)
pt called states she needs refills on the hydroxyzine

## 2020-10-01 ENCOUNTER — Other Ambulatory Visit: Payer: Self-pay | Admitting: Psychiatry

## 2020-10-01 DIAGNOSIS — F331 Major depressive disorder, recurrent, moderate: Secondary | ICD-10-CM

## 2020-10-04 ENCOUNTER — Ambulatory Visit: Payer: Medicare Other | Admitting: Physician Assistant

## 2020-10-06 ENCOUNTER — Encounter: Payer: Self-pay | Admitting: Psychiatry

## 2020-10-06 ENCOUNTER — Ambulatory Visit (INDEPENDENT_AMBULATORY_CARE_PROVIDER_SITE_OTHER): Payer: Medicare HMO | Admitting: Surgery

## 2020-10-06 ENCOUNTER — Telehealth (INDEPENDENT_AMBULATORY_CARE_PROVIDER_SITE_OTHER): Payer: Medicare HMO | Admitting: Psychiatry

## 2020-10-06 ENCOUNTER — Other Ambulatory Visit: Payer: Self-pay

## 2020-10-06 ENCOUNTER — Encounter: Payer: Self-pay | Admitting: Surgery

## 2020-10-06 VITALS — BP 115/80 | HR 126 | Temp 98.3°F | Ht 61.0 in | Wt 144.2 lb

## 2020-10-06 DIAGNOSIS — F4010 Social phobia, unspecified: Secondary | ICD-10-CM

## 2020-10-06 DIAGNOSIS — Z634 Disappearance and death of family member: Secondary | ICD-10-CM

## 2020-10-06 DIAGNOSIS — F331 Major depressive disorder, recurrent, moderate: Secondary | ICD-10-CM

## 2020-10-06 DIAGNOSIS — F411 Generalized anxiety disorder: Secondary | ICD-10-CM | POA: Diagnosis not present

## 2020-10-06 DIAGNOSIS — Z09 Encounter for follow-up examination after completed treatment for conditions other than malignant neoplasm: Secondary | ICD-10-CM

## 2020-10-06 DIAGNOSIS — F5101 Primary insomnia: Secondary | ICD-10-CM

## 2020-10-06 DIAGNOSIS — K8050 Calculus of bile duct without cholangitis or cholecystitis without obstruction: Secondary | ICD-10-CM

## 2020-10-06 MED ORDER — VIIBRYD 40 MG PO TABS: ORAL_TABLET | ORAL | 3 refills | Status: DC

## 2020-10-06 MED ORDER — ZALEPLON 5 MG PO CAPS: 5.0000 mg | ORAL_CAPSULE | Freq: Every evening | ORAL | 1 refills | Status: DC | PRN

## 2020-10-06 NOTE — Patient Instructions (Addendum)

## 2020-10-06 NOTE — Progress Notes (Signed)
10/06/2020  HPI: Wanda [name] is a 65 y.o. female s/p robotic cholecystectomy on 09/21/20.  She presents for follow up.  She reports she's doing well, without any significant pain.  Having some loose stools but no nausea/vomiting.  She's wondering about the cysts found on her MRCP during her last admission.  Vital signs: BP 115/80   Pulse (!) 126   Temp 98.3 F (36.8 C) (Oral)   Ht 5\' 1"  (1.549 m)   Wt 144 lb 3.2 oz (65.4 kg)   LMP 09/30/1991 (Approximate) Comment: 1993  SpO2 95%   BMI 27.25 kg/m    Physical Exam: Constitutional:  No acute distress Abdomen:  Soft, non-distended, non-tender to palpation. Incisions are clean, dry, intact.  Assessment/Plan: This is a 65 y.o. female s/p robotic assisted cholecystectomy.  --Reviewed MRCP findings with patient.  She had a few cystic rim enhancing lesions in the liver, concerning for possible abscess.  However, there was really no true source of infection as she did not have cholangitis.  Suggested that she repeat MRCP in 6 months with her PCP for follow up.  If still there, may need GI referral. --Doing well from surgical standpoint.  Her loose stools should continue to improve as her body adjusts to not having a gallbladder. --Follow up as needed.   Melvyn Neth, [facility]

## 2020-10-06 NOTE — Progress Notes (Signed)
Virtual Visit via Video Note  I connected with Wanda [name] on 10/06/20 at 10:30 AM EDT by a video enabled telemedicine application and verified that I am speaking with the correct person using two identifiers.  Location Provider Location : [facility] Patient Location : Home  Participants: Patient , Provider   I discussed the limitations of evaluation and management by telemedicine and the availability of in person appointments. The patient expressed understanding and agreed to proceed.    I discussed the assessment and treatment plan with the patient. The patient was provided an opportunity to ask questions and all were answered. The patient agreed with the plan and demonstrated an understanding of the instructions.   The patient was advised to call back or seek an in-person evaluation if the symptoms worsen or if the condition fails to improve as anticipated.   Castine MD OP Progress Note  10/06/2020 6:31 PM Wanda [name]  MRN:  [id number]  Chief Complaint:  Chief Complaint    Follow-up; Anxiety; Depression     HPI: Wanda [name] is a 65 year old Caucasian female, widowed, on SSI, lives in [place], has a history of GAD, MDD, social anxiety disorder, SVT, orthostatic hypotension, hyperlipidemia, bereavement, was evaluated by telemedicine today.  Patient reports she had recent hospitalization and had laparoscopic cholecystectomy completed.  She was discharged and is currently recovering.  She continues to have abdominal pain at the site where she had the procedure done however it is getting better.  She reports she feels more energetic than before since the procedure.  She does not feel as tired during the day anymore.  She does have episodes of feeling sad on and off however overall she has been doing better than before.  She reports she is sleeping better than before.  The Sonata does help.  She does have lorazepam available which she takes when she has a lot of anxiety however she  has been limiting use.  She is currently in therapy with hospice therapist.  She reports they may transition her care to another provider since her therapist is leaving.  She is also aware that she can reach out to Ms. Christina Hussami, therapist at our  practice if she needs to continue her therapy sessions.  She denies any suicidality, homicidality or perceptual disturbances.  Patient denies any other concerns today.  Visit Diagnosis:    ICD-10-CM   1. GAD (generalized anxiety disorder)  F41.1   2. MDD (major depressive disorder), recurrent episode, moderate (HCC)  F33.1 Wanda [name] (VIIBRYD) 40 MG TABS  3. Social anxiety disorder  F40.10   4. Primary insomnia  F51.01 zaleplon (SONATA) 5 MG capsule  5. Bereavement  Z63.4     Past Psychiatric History: I have reviewed past psychiatric history from progress note on 08/21/2017.  Past trials of Zoloft, Cymbalta, Effexor, Paxil, Prozac, Wellbutrin, prazosin, Seroquel, trazodone, doxepin    Past Medical History:  Past Medical History:  Diagnosis Date  . Anxiety   . Basal cell carcinoma 05/03/2016   Left deltoid. Superficial.  . Basal cell carcinoma 05/03/2016   Left distal lat. tricep. Nodular.  . Borderline diabetes   . BRONCHITIS, ACUTE WITH BRONCHOSPASM 06/25/2010   Qualifier: Diagnosis of  By: Alveta Heimlich MD, Cornelia Copa    . Chronic prescription benzodiazepine use 10/18/2015  . Depression   . Dysrhythmia    tachycardia  . GASTROENTERITIS WITHOUT DEHYDRATION 05/14/2009   Qualifier: Diagnosis of  By: Deborra Medina MD, Tanja Port    . GERD (gastroesophageal reflux disease)   .  Herpes   . 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 History of kidney stones   . Hyperlipidemia   . Hypertension   . Irregular heart beat   . Pre-diabetes   . Sinus tachycardia 04/11/2017  . Skin cancer of face 08/01/2018  . 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  Dossie Arbour Serial number: [id number] Model number: [id number]     Past Surgical History:  Procedure Laterality Date  . ABDOMINAL HYSTERECTOMY    . APPENDECTOMY    . BACK SURGERY     X 3  . BREAST EXCISIONAL BIOPSY Right    surgical bx age 89   . COLONOSCOPY N/A 08/28/2013   Procedure: COLONOSCOPY;  Surgeon: Danie Binder, MD;  [facility];  Service: Endoscopy;  Laterality: N/A;  8:30 AM  . ENDOSCOPIC RETROGRADE CHOLANGIOPANCREATOGRAPHY (ERCP) WITH PROPOFOL N/A 09/20/2020   Procedure: ENDOSCOPIC RETROGRADE CHOLANGIOPANCREATOGRAPHY (ERCP) WITH PROPOFOL;  Surgeon: Lucilla Lame, MD;  Location: [facility] ENDOSCOPY;  Service: Endoscopy;  Laterality: N/A;  . INTRAMEDULLARY (IM) NAIL INTERTROCHANTERIC Left 05/30/2016   Procedure: INTRAMEDULLARY (IM) NAIL INTERTROCHANTRIC;  Surgeon: Thornton Park, MD;  Location: [facility] ORS;  Service: Orthopedics;  Laterality: Left;  . SHOULDER SURGERY Right   . Spinal cord stimulator    . SPINAL CORD STIMULATOR REMOVAL N/A 01/12/2020   Procedure: REMOVAL SPINAL CORD STIMULATOR AND PULSE GENERATOR;  Surgeon: Deetta Perla, MD;  Location: [facility] ORS;  Service: Neurosurgery;  Laterality: N/A;  Local w/ MAC    Family Psychiatric History: I have reviewed family psychiatric history from progress note on 08/21/2017  Family History:  Family History  Problem Relation Age of Onset  . Diabetes Mother   . COPD Mother   . Heart disease Mother   . Diabetes Father   . COPD Father   . Heart disease Father   . Heart attack Father 16       CABG  . Alcohol abuse Daughter   . Depression Daughter   . Drug abuse Daughter   . Anxiety disorder Daughter   . Tuberculosis Maternal Aunt   . Cancer Paternal Aunt        breast  . Breast cancer Paternal Aunt   . Diabetes Paternal Grandmother   . Diabetes Sister   . Cancer Cousin   . Diabetes Cousin   . Colon cancer Neg Hx   . Sudden Cardiac Death Neg Hx     Social History: I have reviewed social history from progress note on  08/21/2017 Social History   Socioeconomic History  . Marital status: Divorced    Spouse name: Not on file  . Number of children: 2  . Years of education: Not on file  . Highest education level: High school graduate  Occupational History    Comment: disabled  Tobacco Use  . Smoking status: Former Smoker    Packs/day: 0.25    Years: 14.00    Pack years: 3.50    Types: Cigarettes    Quit date: 1992    Years since quitting: 30.3  . Smokeless tobacco: Never Used  Vaping Use  . Vaping Use: Never used  Substance and Sexual Activity  . Alcohol use: Yes    Comment: occasional; once every 6 months  . Drug use: No  . Sexual activity: Yes    Birth control/protection: Surgical  Other Topics Concern  . Not on file  Social History Narrative  . Not  on file   Social Determinants of Health   Financial Resource Strain: Not on file  Food Insecurity: Not on file  Transportation Needs: Not on file  Physical Activity: Not on file  Stress: Not on file  Social Connections: Not on file    Allergies:  Allergies  Allergen Reactions  . Levofloxacin Hives and Nausea And Vomiting  . Tramadol Other (See Comments)    Severe headache  . Codeine Nausea Only    Slight nausea  . Mobic [Meloxicam] Other (See Comments)    Calf tightness  . Propoxyphene [name] Rash    Metabolic Disorder Labs: Lab Results  Component Value Date   HGBA1C 5.4 05/29/2016   MPG 108 05/29/2016   No results found for: PROLACTIN Lab Results  Component Value Date   CHOL 198 07/08/2018   TRIG 371 (H) 07/08/2018   HDL 46 07/08/2018   CHOLHDL 4.3 07/08/2018   VLDL 59 (H) 09/29/2016   LDLCALC 78 07/08/2018   LDLCALC 106 (H) 09/29/2016   Lab Results  Component Value Date   TSH 3.700 09/19/2020   TSH 0.932 07/14/2020    Therapeutic Level Labs: No results found for: LITHIUM No results found for: VALPROATE No components found for:  CBMZ  Current Medications: Current Outpatient Medications  Medication Sig  Dispense Refill  . atorvastatin (LIPITOR) 40 MG tablet Take 40 mg by mouth daily. PM    . cetirizine (ZYRTEC) 10 MG tablet Take 10 mg by mouth daily.  (Patient not taking: Reported on 09/19/2020)    . Cholecalciferol (VITAMIN D3) 5000 units TABS Take 5,000 Units by mouth daily.    Marland Kitchen dexlansoprazole (DEXILANT) 60 MG capsule Take 60 mg by mouth daily.     . famotidine (PEPCID) 40 MG tablet Take 40 mg by mouth daily.     Marland Kitchen LORazepam (ATIVAN) 1 MG tablet Take 0.5 tablets (0.5 mg total) by mouth as directed. Take half tablet ( 0.5 mg) daily once as needed for severe anxiety attacks only. 21 tablet 1  . metoprolol succinate (TOPROL-XL) 25 MG 24 hr tablet TAKE 1 TABLET(25 MG) BY MOUTH DAILY 90 tablet 0  . Multiple Vitamin (MULTIVITAMIN) tablet Take 1 tablet by mouth daily.    . Naloxone [name] 0.4 MG/0.4ML SOAJ Take if having symptoms of opioid overdose including extreme sleepiness or stopping breathing 0.4 mL 0  . oxyCODONE (OXY IR/ROXICODONE) 5 MG immediate release tablet Take 1 tablet (5 mg total) by mouth every 4 (four) hours as needed for severe pain. 5 tablet 0  . polyethylene glycol (MIRALAX) 17 g packet Take 17 g by mouth daily as needed for moderate constipation. 14 each 0  . potassium chloride (KLOR-CON) 20 MEQ packet Take 20 mEq by mouth 2 (two) times daily.    . potassium chloride SA (KLOR-CON) 20 MEQ tablet Take 20 mEq by mouth 2 (two) times daily.    . Wanda [name] (VIIBRYD) 40 MG TABS DAILY 30 tablet 3  . zaleplon (SONATA) 5 MG capsule Take 1 capsule (5 mg total) by mouth at bedtime as needed for sleep. 30 capsule 1   No current facility-administered medications for this visit.     Musculoskeletal: Strength & Muscle Tone: UTA Gait & Station: UTA Patient leans: N/A  Psychiatric Specialty Exam: Review of Systems  Gastrointestinal:       Abdominal pain , improving - s/p cholecystectomy  Psychiatric/Behavioral: Positive for dysphoric mood and sleep disturbance. The patient is  nervous/anxious.   All other systems reviewed and are  negative.   Last menstrual period 09/30/1991.There is no height or weight on file to calculate BMI.  General Appearance: Casual  Eye Contact:  Fair  Speech:  Clear and Coherent  Volume:  Normal  Mood:  Anxious and Depressed improving  Affect:  Congruent  Thought Process:  Goal Directed and Descriptions of Associations: Intact  Orientation:  Full (Time, Place, and Person)  Thought Content: Logical   Suicidal Thoughts:  No  Homicidal Thoughts:  No  Memory:  Immediate;   Fair Recent;   Fair Remote;   Fair  Judgement:  Fair  Insight:  Fair  Psychomotor Activity:  Normal  Concentration:  Concentration: Fair and Attention Span: Fair  Recall:  [employer] of Knowledge: Fair  Language: Fair  Akathisia:  No  Handed:  Right  AIMS (if indicated): UTA  Assets:  Communication Skills Desire for Improvement Housing Social Support  ADL's:  Intact  Cognition: WNL  Sleep:  improving   Screenings: PHQ2-9   Flowsheet Row Video Visit from 10/06/2020 in [place] Visit from 07/30/2020 in [place] Video Visit from 04/16/2020 in [place] Office Visit from 12/08/2019 in [place] Procedure visit from 07/24/2017 in [place]  PHQ-2 Total Score _0 0 0  PHQ-9 Total Score _1 -- --    Flowsheet Row Video Visit from 10/06/2020 in [facility] ED to Hosp-Admission (Discharged) from 09/18/2020 in [place] Office Visit from 07/30/2020 in [place]  C-SSRS RISK CATEGORY No Risk No Risk No Risk       Assessment and Plan: Wanda [name] is a 65 year old Caucasian female, widowed, lives in [place], has a history of depression, anxiety, chronic pain, SVT was evaluated  by telemedicine today.  Patient is currently making some progress.  She will benefit from the following plan.  Plan MDD-improving Viibryd 40 mg p.o. daily Continue CBT  GAD-improving Viibryd is prescribed Lorazepam 0.5 mg as needed for severe anxiety attacks.  Social anxiety disorder-improving Continue CBT  Insomnia-improving Sonata 5 mg p.o. nightly  Bereavement-improving Continue grief counseling.  Patient advised to follow-up with cardiology for heart palpitation.  I have reviewed notes for Dr.Behling -dated 09/30/2020-patient hospitalized at [facility]-09/18/2020 - 09/22/2020.  Status post laparoscopic cholecystectomy. Patient with history of hypertension-blood pressure reading 09/30/2020-110/79 History of paroxysmal SVT.  Was supposed to have echocardiogram per cardiology.  Pulse persistently elevated been in hospital.      Follow-up in clinic in 4 to 6 weeks or sooner if needed.  This note was generated in part or whole with voice recognition software. Voice recognition is usually quite accurate but there are transcription errors that can and very often do occur. I apologize for any typographical errors that were not detected and corrected.      Ursula Alert, MD 10/07/2020, 6:00 PM

## 2020-10-11 ENCOUNTER — Other Ambulatory Visit: Payer: Self-pay | Admitting: Internal Medicine

## 2020-10-13 MED ORDER — TEMAZEPAM 7.5 MG PO CAPS: 7.5000 mg | ORAL_CAPSULE | Freq: Every evening | ORAL | 0 refills | Status: DC | PRN

## 2020-10-13 NOTE — Telephone Encounter (Signed)
pt called left a message that she not sleeping but 3-4 hours and that the sonata is not working

## 2020-10-13 NOTE — Telephone Encounter (Signed)
Patient reports she is having trouble with sleeping, has racing thoughts.  Read Drivers is not working.  Discontinue Sonata.  Discontinue lorazepam.  Start temazepam 7.5 mg at bedtime as needed  Provided medication education.  Discussed referral to IOP/PHP.

## 2020-10-20 ENCOUNTER — Telehealth: Payer: Self-pay

## 2020-10-20 NOTE — Telephone Encounter (Signed)
spoke with pharmacy got the information for patient insurance so a prior auth could be submit

## 2020-10-20 NOTE — Telephone Encounter (Signed)
prior Josem Kaufmann was approved for the temazepam until 05-28-21

## 2020-10-20 NOTE — Telephone Encounter (Signed)
submitted the prior auth on [email or web address]- pending

## 2020-10-20 NOTE — Telephone Encounter (Signed)
pt called states that the pharmacy states she needs a prior auth on her temazepam

## 2020-10-28 ENCOUNTER — Other Ambulatory Visit: Payer: Self-pay

## 2020-10-28 ENCOUNTER — Ambulatory Visit (INDEPENDENT_AMBULATORY_CARE_PROVIDER_SITE_OTHER): Payer: Medicare HMO

## 2020-10-28 DIAGNOSIS — R000 Tachycardia, unspecified: Secondary | ICD-10-CM | POA: Diagnosis not present

## 2020-10-28 DIAGNOSIS — R0789 Other chest pain: Secondary | ICD-10-CM | POA: Diagnosis not present

## 2020-10-28 LAB — ECHOCARDIOGRAM COMPLETE
AR max vel: 3.1 cm2
AV Area VTI: 2.72 cm2
AV Area mean vel: 2.9 cm2
AV Mean grad: 4 mmHg
AV Peak grad: 7.3 mmHg
Ao pk vel: 1.35 m/s
Area-P 1/2: 2.66 cm2
Calc EF: 50.9 %
S' Lateral: 3.2 cm
Single Plane A2C EF: 51.6 %
Single Plane A4C EF: 51.2 %

## 2020-10-29 ENCOUNTER — Ambulatory Visit: Payer: Medicare HMO | Admitting: Physician Assistant

## 2020-10-29 ENCOUNTER — Encounter: Payer: Self-pay | Admitting: Physician Assistant

## 2020-10-29 VITALS — BP 110/66 | HR 96 | Ht 61.0 in | Wt 144.0 lb

## 2020-10-29 DIAGNOSIS — I471 Supraventricular tachycardia: Secondary | ICD-10-CM | POA: Diagnosis not present

## 2020-10-29 DIAGNOSIS — K219 Gastro-esophageal reflux disease without esophagitis: Secondary | ICD-10-CM

## 2020-10-29 DIAGNOSIS — E781 Pure hyperglyceridemia: Secondary | ICD-10-CM

## 2020-10-29 DIAGNOSIS — I951 Orthostatic hypotension: Secondary | ICD-10-CM

## 2020-10-29 DIAGNOSIS — Z8679 Personal history of other diseases of the circulatory system: Secondary | ICD-10-CM

## 2020-10-29 DIAGNOSIS — E785 Hyperlipidemia, unspecified: Secondary | ICD-10-CM

## 2020-10-29 NOTE — Patient Instructions (Addendum)
Medication Instructions:  Your physician recommends that you continue on your current medications as directed. Please refer to the Current Medication list given to you today.  *If you need a refill on your cardiac medications before your next appointment, please call your pharmacy*   Lab Work:  None ordered  Testing/Procedures:  None ordered   Follow-Up: At [facility], you and your health needs are our priority.  As part of our continuing mission to provide you with exceptional heart care, we have created designated Provider Care Teams.  These Care Teams include your primary Cardiologist (physician) and Advanced Practice Providers (APPs -  Physician Assistants and Nurse Practitioners) who all work together to provide you with the care you need, when you need it.  We recommend signing up for the patient portal called "MyChart".  Sign up information is provided on this After Visit Summary.  MyChart is used to connect with patients for Virtual Visits (Telemedicine).  Patients are able to view lab/test results, encounter notes, upcoming appointments, etc.  Non-urgent messages can be sent to your provider as well.   To learn more about what you can do with MyChart, go to [email or web address].    Your next appointment:   1 month(s)  The format for your next appointment:   In Person  Provider:   You may see Nelva Bush, MD or one of the following Advanced Practice Providers on your designated Care Team:     Marrianne Mood, PA-C   Other Instructions  Wean off of gatorade Start with 1 gatorade every other day and then 1/2 gatorade every other day. Let us know if you are dizzy

## 2020-10-29 NOTE — Progress Notes (Signed)
Office Visit    Patient Name: Wanda [name] Date of Encounter: 10/29/2020  PCP:  Barbaraann Boys, [place]  Cardiologist:  Nelva Bush, MD Advanced Practice Provider:  No care team member to display Electrophysiologist:  None  :[id number]}   Chief Complaint    Chief Complaint  Patient presents with  . Other    Follow up post ECHO. Meds reviewed verbally with patient.     65 year old female with history of orthostatic hypotension, sinus tachycardia, hyperlipidemia, anxiety, depression, and she presents today for 1 month follow-up of palpitations and lightheadedness.  Past Medical History    Past Medical History:  Diagnosis Date  . Anxiety   . Basal cell carcinoma 05/03/2016   Left deltoid. Superficial.  . Basal cell carcinoma 05/03/2016   Left distal lat. tricep. Nodular.  . Borderline diabetes   . BRONCHITIS, ACUTE WITH BRONCHOSPASM 06/25/2010   Qualifier: Diagnosis of  By: Alveta Heimlich MD, Cornelia Copa    . Chronic prescription benzodiazepine use 10/18/2015  . Depression   . Dysrhythmia    tachycardia  . GASTROENTERITIS WITHOUT DEHYDRATION 05/14/2009   Qualifier: Diagnosis of  By: Deborra Medina MD, Tanja Port    . GERD (gastroesophageal reflux disease)   . Herpes   . 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 History of kidney stones   . Hyperlipidemia   . Hypertension   . Irregular heart beat   . Pre-diabetes   . Sinus tachycardia 04/11/2017  . Skin cancer of face 08/01/2018  . 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 Dossie Arbour Serial number: [id number] Model number: [id number]    Past Surgical History:  Procedure Laterality Date  . ABDOMINAL HYSTERECTOMY    . APPENDECTOMY    . BACK SURGERY     X 3  . BREAST EXCISIONAL BIOPSY Right    surgical bx age 14   . CHOLECYSTECTOMY  09/2020  . COLONOSCOPY N/A 08/28/2013   Procedure: COLONOSCOPY;  Surgeon:  Danie Binder, MD;  Location: [facility];  Service: Endoscopy;  Laterality: N/A;  8:30 AM  . ENDOSCOPIC RETROGRADE CHOLANGIOPANCREATOGRAPHY (ERCP) WITH PROPOFOL N/A 09/20/2020   Procedure: ENDOSCOPIC RETROGRADE CHOLANGIOPANCREATOGRAPHY (ERCP) WITH PROPOFOL;  Surgeon: Lucilla Lame, MD;  Location: [facility];  Service: Endoscopy;  Laterality: N/A;  . INTRAMEDULLARY (IM) NAIL INTERTROCHANTERIC Left 05/30/2016   Procedure: INTRAMEDULLARY (IM) NAIL INTERTROCHANTRIC;  Surgeon: Thornton Park, MD;  Location: [facility];  Service: Orthopedics;  Laterality: Left;  . SHOULDER SURGERY Right   . Spinal cord stimulator    . SPINAL CORD STIMULATOR REMOVAL N/A 01/12/2020   Procedure: REMOVAL SPINAL CORD STIMULATOR AND PULSE GENERATOR;  Surgeon: Deetta Perla, MD;  Location: [facility];  Service: Neurosurgery;  Laterality: N/A;  Local w/ MAC    Allergies  Allergies  Allergen Reactions  . Levofloxacin Hives and Nausea And Vomiting  . Tramadol Other (See Comments)    Severe headache  . Codeine Nausea Only    Slight nausea  . Mobic [Meloxicam] Other (See Comments)    Calf tightness  . Propoxyphene Hcl Rash    History of Present Illness    Wanda [name] is a 65 y.o. female with PMH as above.  65 year old female with history of orthostatic hypotension, hyperlipidemia, previously, anxiety, and depression who presents today for palpitations and lightheadedness.    She was seen by her primary cardiologist, Dr. Harrell Gave End, 1 year prior.  At that time, she reported some exertional dyspnea walking across the parking lot.   Zio monitor completed that showed ST with rare ectopy, 4 runs of brief PSVT.   Seen 09/02/2020 reporting that she did not have significant symptoms while wearing her monitor.  She usually did not feel her heart racing or skipping beats.  She reported intermittent, sharp, positional, and brief in duration chest pain that mainly occurred on her left side.  She mainly would note this  discomfort when sitting still or lying in bed.  Chest pain was somewhat vague in description. She also noted that she occasionally felt short of breath for brief periods of time without clear triggers.  She was walking each day.  She was drinking 1 cup of water per day and lots of Gatorade.  She reported significant stress and grief, as her daughter had passed away in 24-Nov-2022.  She struggled to get sleep and noted fatigue.  She was working with a psychiatrist, who is worried about her medications interacting with certain cardiac medications or exacerbating her tachycardia.  Echo with EF 60 to 65%, G1 DD.  Today, 10/29/2020, she returns to clinic and notes that her psychiatrist has recently discontinued her anxiety medication, which has resulted in significant anxiety since that time.  Specifically, she reports recent discontinuation of hydroxyzine.  At previous clinic visits, it was discussed that Abilify may be discontinued due to sinus tachycardia; however, it is unclear why hydroxyzine has been discontinued.  Review of EMR is unrevealing.  She denies any chest pain and reports that her breathing has been well controlled.  She reports ongoing rare tachypalpitations, worsened with worsening anxiety.  No presyncope or syncope.  SBP usually 110-118.  No recent falls. Heart rate today 96 bpm.  Recent echo reviewed in detail.  We discussed possible rate control with diltiazem versus propanolol and with consideration of her recent anxiety.  Also discussed was Corlanor though cost may be a limiting factor.  No signs or symptoms of bleeding.  She continues to drink lots of Gatorade with recommendation to try and wean back on her Gatorade.  On review of EMR, Gatorade was initially recommended due to symptoms of orthostasis with SBP 80s to 90s.  Her SBP has now significantly improved and is usually 110s to 120s with recommendation to wean off of Gatorade.  Ideally, she will drink water for fluids, as this would be overall  better from a cardiovascular standpoint and have less sugar and salt.  Home Medications    Current Outpatient Medications on File Prior to Visit  Medication Sig Dispense Refill  . atorvastatin (LIPITOR) 40 MG tablet Take 40 mg by mouth daily. PM    . cetirizine (ZYRTEC) 10 MG tablet Take 10 mg by mouth daily.    . Cholecalciferol (VITAMIN D3) 5000 units TABS Take 5,000 Units by mouth daily.    Marland Kitchen dexlansoprazole (DEXILANT) 60 MG capsule Take 60 mg by mouth daily.     . famotidine (PEPCID) 40 MG tablet Take 40 mg by mouth daily.     . metoprolol succinate (TOPROL-XL) 25 MG 24 hr tablet TAKE 1 TABLET(25 MG) BY MOUTH DAILY 90 tablet 0  . Multiple Vitamin (MULTIVITAMIN) tablet Take 1 tablet by mouth daily.    . polyethylene glycol (MIRALAX) 17 g packet Take 17 g by mouth daily as needed for moderate constipation. 14 each 0  . potassium chloride (KLOR-CON) 20 MEQ packet Take 20 mEq by mouth 2 (two) times daily.    Marland Kitchen  temazepam (RESTORIL) 7.5 MG capsule Take 1 capsule (7.5 mg total) by mouth at bedtime as needed for sleep. 30 capsule 0  . Vilazodone HCl (VIIBRYD) 40 MG TABS DAILY 30 tablet 3   No current facility-administered medications on file prior to visit.    Review of Systems    She denies pnd, chest pain, dyspnea, shortness of breath, orthopnea, n, v, dizziness, syncope, edema, weight gain, or early satiety.  No further chest discomfort.  She reports ongoing episodes of racing heart rate/palpitations.  She reports anxiety and fatigue.  All other systems reviewed and are otherwise negative except as noted above.  Physical Exam    VS:  BP 110/66 (BP Location: Left Arm, Patient Position: Sitting, Cuff Size: Normal)   Pulse 96   Ht _0  (1.549 m)   Wt 144 lb (65.3 kg)   LMP 09/30/1991 (Approximate) Comment: 1993  SpO2 96%   BMI 27.21 kg/m  , BMI Body mass index is 27.21 kg/m. GEN: Well nourished, well developed, in no acute distress.  Facemask in place. HEENT: normal. Neck: Supple,  no JVD, carotid bruits, or masses. Cardiac: RRR, no murmurs, rubs, or gallops. No clubbing, cyanosis, edema.  Radials/DP/PT 2+ and equal bilaterally.  Respiratory:  Respirations regular and unlabored, clear to auscultation bilaterally. GI: Soft, nontender, nondistended, BS + x 4. MS: no deformity or atrophy. Skin: warm and dry, no rash. Neuro:  Strength and sensation are intact. Psych: Normal affect.  Accessory Clinical Findings    ECG personally reviewed by me today -NSR, 96 bpm, short PR with PR interval 106 ms, QRS 72 ms, QTC 454 ms, poor R wave progression in lead III likely due to lead placement, PA-C/fusion complex noted on EKG- no acute changes.  VITALS Reviewed today   Temp Readings from Last 3 Encounters:  10/06/20 98.3 F (36.8 C) (Oral)  09/22/20 98 F (36.7 C)  01/12/20 98.3 F (36.8 C) (Temporal)   BP Readings from Last 3 Encounters:  10/29/20 110/66  10/06/20 115/80  09/22/20 100/66   Pulse Readings from Last 3 Encounters:  10/29/20 96  10/06/20 (!) 126  09/22/20 99    Wt Readings from Last 3 Encounters:  10/29/20 144 lb (65.3 kg)  10/06/20 144 lb 3.2 oz (65.4 kg)  09/21/20 145 lb (65.8 kg)     LABS  reviewed today   Lab Results  Component Value Date   WBC 8.9 09/21/2020   HGB 12.1 09/21/2020   HCT 35.1 (L) 09/21/2020   MCV 91.6 09/21/2020   PLT 238 09/21/2020   Lab Results  Component Value Date   CREATININE 0.58 09/21/2020   BUN 5 (L) 09/21/2020   NA 139 09/21/2020   K 3.9 09/21/2020   CL 108 09/21/2020   CO2 23 09/21/2020   Lab Results  Component Value Date   ALT 86 (H) 09/21/2020   AST 31 09/21/2020   ALKPHOS 130 (H) 09/21/2020   BILITOT 0.6 09/21/2020   Lab Results  Component Value Date   CHOL 198 07/08/2018   HDL 46 07/08/2018   LDLCALC 78 07/08/2018   LDLDIRECT 197.3 02/01/2010   TRIG 371 (H) 07/08/2018   CHOLHDL 4.3 07/08/2018    Lab Results  Component Value Date   HGBA1C 5.4 05/29/2016   Lab Results  Component Value  Date   TSH 3.700 09/19/2020     STUDIES/PROCEDURES reviewed today   Echo  10/28/20  1. Left ventricular ejection fraction, by estimation, is 60 to 65%. The  left  ventricle has normal function. The left ventricle has no regional  wall motion abnormalities. Left ventricular diastolic parameters are  consistent with Grade I diastolic  dysfunction (impaired relaxation).   2. Right ventricular systolic function is normal. The right ventricular  size is normal. Tricuspid regurgitation signal is inadequate for assessing  PA pressure.   08/18/20 Cardiac monitor  The patient was monitored for 14 days.  The predominant rhythm was sinus with an average rate of 93 bpm (range 55-180 bpm).  There were rare PAC's and occasional PVC's (1.1% PVC burden).  Four atrial runs lasting up to 23.3 seconds occurred with a maximum rate of 174 bpm.  No sustained arrhythmia or prolonged pause was identified.  Patient triggered event corresponds to sinus rhythm with isolated PVC. Predominantly sinus rhythm with rare PAC's and occasional PVC's.  PSVT also noted (see details above).  Echo 05/2016 - Procedure narrative: Transthoracic echocardiography. The study  was technically difficult.  - Left ventricle: Systolic function was normal. The estimated  ejection fraction was in the range of 60% to 65%.  - Aortic valve: Valve area (Vmax): 2.13 cm^2.   Long term monitor 05/2016  The patient was monitored for 13 days, 20 hours.  The predominant rhythm was sinus with an average rate of 84 bpm (range 52-170 bpm).  Rare PACs and atrial couplets were identified. Rare isolated PVCs were also seen.  There were 5 episodes of supraventricular tachycardia lasting up to 10.8 seconds with a maximal rate of 152 bpm. There were no sustained arrhythmias or prolonged pauses.  There were no patient triggered episodes. Predominantly sinus rhythm with rare PACs and PVCs, as well as paroxysmal  SVT.  Carotids 2018 IMPRESSION: Mild bilateral carotid atherosclerotic vascular disease. No flow limiting stenosis. Degree of stenosis less than 50% bilaterally. 2. Vertebral arteries are patent with antegrade flow.  Assessment & Plan    Sinus tachycardia PSVT, PACs, PVCs --Reports tachypalpitations and anxiety. Recent monitor as above with sinus tachycardia, rare PACs and PVCs, paroxysmal SVT.  Discussed fluid intake and caffeine.  She is remaining hydrated.  She denies any further orthostasis sx as below.  Echo obtained with EF nl and could start diltiazem; however, given anxiety, also considered is propanolol. Will defer for now and discuss with DOD, as will not want to lower BP too far given soft pressures. Reviewed recommendations for heart healthy diet.  We also discussed increasing activity as tolerated - she is using the exercise bands successfully.  Continue Toprol for now pending recommendation for Propanolol versus diltiazem. Could also consider corlanor/ivabradine, though cost may be an issue with this medication.  HLD, goal LDL below 100 Hypertriglyceridemia -- Continue current statin.  Consider Vascepa in the future due to elevated triglycerides at 371.  Bilateral carotid artery disease, mild --No reported signs or symptoms of worsening carotid artery disease. Periodic imaging to monitor.  Recommend control LDL, BP, heart rate.  Continue statin.  GERD on PPI --Continue PPI.  No reported signs or symptoms of bleeding.  History of orthostatic hypotension, resolved --Resolution of symptoms as above.  Recommend continue hydration. Will attempt to wean off of Gatorade.  Call the office if return of symptoms.  Insomnia/grief --Continue with psychiatrist/PCP with patient understanding. Will reach out to help clarify the issue with her antianxiety medications from her psychiatrist, given she reports they have been discontinued from a cardiac standpoint.  Reports recent  discontinuation of her hydroxyzine with reasons unclear at this time.  Disposition: RTC 1 mo-Will reach out  to her regarding recommendation for change in rate control once discussion with DOD   Arvil Chaco, PA-C 10/29/2020

## 2020-11-02 ENCOUNTER — Other Ambulatory Visit: Payer: Self-pay

## 2020-11-02 ENCOUNTER — Ambulatory Visit (INDEPENDENT_AMBULATORY_CARE_PROVIDER_SITE_OTHER): Payer: Medicare HMO | Admitting: Licensed Clinical Social Worker

## 2020-11-02 DIAGNOSIS — F331 Major depressive disorder, recurrent, moderate: Secondary | ICD-10-CM

## 2020-11-02 DIAGNOSIS — F411 Generalized anxiety disorder: Secondary | ICD-10-CM

## 2020-11-02 NOTE — Progress Notes (Signed)
Virtual Visit via Video Note  I connected with [name] on 11/02/20 at  8:00 AM EDT by a video enabled telemedicine application and verified that I am speaking with the correct person using two identifiers.  Location: Patient: home Provider: remote office [place], [place])   I discussed the limitations of evaluation and management by telemedicine and the availability of in person appointments. The patient expressed understanding and agreed to proceed.  I discussed the assessment and treatment plan with the patient. The patient was provided an opportunity to ask questions and all were answered. The patient agreed with the plan and demonstrated an understanding of the instructions.   The patient was advised to call back or seek an in-person evaluation if the symptoms worsen or if the condition fails to improve as anticipated.  I provided 40 minutes of non-face-to-face time during this encounter.   Beverlyann Broxterman R Jawanna Dykman, LCSW    THERAPIST PROGRESS NOTE  Session Time: 8-8:40a  Participation Level: Active  Behavioral Response: Neat and Well GroomedAlertDepressed  Type of Therapy: Individual Therapy  Treatment Goals addressed: Coping  Interventions: CBT  Summary: AVERILL WINTERS is a 65 y.o. female who presents with symptoms consistent with depression and anxiety. Pt reports that mood and stress levels both fluctuate depending on situational stressors. Pt reports that she is trying to utilize coping skills on a regular basis to regulate mood and stress.  Allowed pt to explore and express thoughts and feelings associated with recent life situations and external stressors. Pt reports that she has gall bladder surgery and had to cope with the news that her sister has pancreatic cancer since last session. Allowed pt to discuss recovery from the surgery and feelings about her sister's diagnosis.   Pt discussed her thoughts about grandchildren and their current situations (one grandson  in jail).  Pt also feels as if her son in law is continuing to keep the grandchildren from her (deceased daughter's children).   Discussed financial hardships with the price of gas and food so high. Pt shared her tips and choices that she is currently making that are helping her make ends meet. Validated pts feelings and offered positive supports.  Continued recommendations are as follows: self care behaviors, positive social engagements, focusing on overall work/home/life balance, and focusing on positive physical and emotional wellness.   Suicidal/Homicidal: No  Therapist Response: Trenia is reporting a relapse of depression and anxiety symptoms which is reflective of intermittent/fluctuating progress. Reviewed coping skills and emailed pt psychoeducational resource sheets. Treatment to continue as indicated.  Plan: Return again in 4 weeks. Reviewed and revised treatment plan.  Diagnosis: Axis I: MDD, recurrent, moderate; GAD    Axis II: No diagnosis    Amali Uhls Krishana Lutze, LCSW 11/02/2020

## 2020-11-04 ENCOUNTER — Telehealth: Payer: Self-pay | Admitting: Physician Assistant

## 2020-11-04 DIAGNOSIS — I471 Supraventricular tachycardia: Secondary | ICD-10-CM

## 2020-11-04 MED ORDER — PROPRANOLOL HCL 10 MG PO TABS: 10.0000 mg | ORAL_TABLET | Freq: Three times a day (TID) | ORAL | 3 refills | Status: DC

## 2020-11-04 NOTE — Telephone Encounter (Signed)
Spoke with the patient to inform her that I reached out to her primary cardiologist, and I wanted to update her regarding the below:   1) trial propanolol 10 mg 3 times daily for heart rate and blood pressure control.  2) hold metoprolol succinate 25 mg daily for now.  2) It is acceptable to continue hydroxyzine from a cardiovascular standpoint, as discussed over the phone.  She will hold off on restarting hydroxyzine to avoid too many medication changes at 1 time.

## 2020-11-05 ENCOUNTER — Other Ambulatory Visit: Payer: Self-pay | Admitting: *Deleted

## 2020-11-05 DIAGNOSIS — I471 Supraventricular tachycardia: Secondary | ICD-10-CM

## 2020-11-05 MED ORDER — PROPRANOLOL HCL 10 MG PO TABS: 10.0000 mg | ORAL_TABLET | Freq: Three times a day (TID) | ORAL | 0 refills | Status: DC

## 2020-11-08 ENCOUNTER — Other Ambulatory Visit: Payer: Self-pay | Admitting: *Deleted

## 2020-11-08 MED ORDER — METOPROLOL SUCCINATE ER 25 MG PO TB24: 25.0000 mg | ORAL_TABLET | Freq: Every day | ORAL | Status: DC

## 2020-11-12 ENCOUNTER — Telehealth: Payer: Self-pay | Admitting: Psychiatry

## 2020-11-12 DIAGNOSIS — F331 Major depressive disorder, recurrent, moderate: Secondary | ICD-10-CM

## 2020-11-12 MED ORDER — VILAZODONE HCL 40 MG PO TABS: 40.0000 mg | ORAL_TABLET | Freq: Every day | ORAL | 3 refills | Status: DC

## 2020-11-12 NOTE — Telephone Encounter (Signed)
I have sent Viibryd to pharmacy with instructions to provide generic substitution.

## 2020-11-16 ENCOUNTER — Other Ambulatory Visit: Payer: Self-pay

## 2020-11-16 ENCOUNTER — Telehealth (INDEPENDENT_AMBULATORY_CARE_PROVIDER_SITE_OTHER): Payer: Medicare HMO | Admitting: Psychiatry

## 2020-11-16 ENCOUNTER — Encounter: Payer: Self-pay | Admitting: Psychiatry

## 2020-11-16 DIAGNOSIS — F3341 Major depressive disorder, recurrent, in partial remission: Secondary | ICD-10-CM | POA: Diagnosis not present

## 2020-11-16 DIAGNOSIS — Z634 Disappearance and death of family member: Secondary | ICD-10-CM

## 2020-11-16 DIAGNOSIS — F411 Generalized anxiety disorder: Secondary | ICD-10-CM

## 2020-11-16 DIAGNOSIS — F4010 Social phobia, unspecified: Secondary | ICD-10-CM

## 2020-11-16 DIAGNOSIS — F5101 Primary insomnia: Secondary | ICD-10-CM | POA: Diagnosis not present

## 2020-11-16 MED ORDER — HYDROXYZINE PAMOATE 25 MG PO CAPS: 25.0000 mg | ORAL_CAPSULE | Freq: Three times a day (TID) | ORAL | 1 refills | Status: DC | PRN

## 2020-11-16 MED ORDER — TEMAZEPAM 7.5 MG PO CAPS
15.0000 mg | ORAL_CAPSULE | Freq: Every evening | ORAL | 0 refills | Status: DC | PRN
Start: 2020-11-16 — End: 2020-12-16

## 2020-11-16 NOTE — Progress Notes (Signed)
Virtual Visit via Video Note  I connected with Wanda [name] on 11/16/20 at  4:30 PM EDT by a video enabled telemedicine application and verified that I am speaking with the correct person using two identifiers.  Location Provider Location : [facility] Patient Location : Home  Participants: Patient , Provider   I discussed the limitations of evaluation and management by telemedicine and the availability of in person appointments. The patient expressed understanding and agreed to proceed.    I discussed the assessment and treatment plan with the patient. The patient was provided an opportunity to ask questions and all were answered. The patient agreed with the plan and demonstrated an understanding of the instructions.   The patient was advised to call back or seek an in-person evaluation if the symptoms worsen or if the condition fails to improve as anticipated.  [place] MD OP Progress Note  11/16/2020 5:35 PM Wanda [name]  MRN:  [id number]  Chief Complaint:  Chief Complaint   Follow-up; Depression; Anxiety    HPI: Wanda [name] is a 65 year old Caucasian female, widowed, on SSI, lives in [place], has a history of GAD, MDD, social anxiety disorder, SVT, orthostatic hypotension, hyperlipidemia, bereavement was evaluated by telemedicine today.  Patient today reports she continues to struggle with sleep problems.  She reports the temazepam did not help at 7.5 mg.  She either has difficulty falling asleep or she has difficulty waking up too soon.  She reports she hence went back on the zolpidem however that also does not seem to help.  She did not try a higher dosage of the temazepam.  She continues to struggle with anxiety, worries a lot, is often restless and nervous and has trouble concentrating and relaxing.  This has been going on since the past several months, not getting any better.  She is currently in psychotherapy sessions ,does have upcoming appointment coming up.  Patient denies  any suicidality, homicidality or perceptual disturbances.  Patient denies any significant sadness or crying spells at this time.  She continues to grieve the loss of her daughter.  Patient denies any other concerns today.  Visit Diagnosis:    ICD-10-CM   1. GAD (generalized anxiety disorder)  F41.1 hydrOXYzine (VISTARIL) 25 MG capsule    2. MDD (major depressive disorder), recurrent, in partial remission ([place])  F33.41     3. Social anxiety disorder  F40.10 hydrOXYzine (VISTARIL) 25 MG capsule    4. Primary insomnia  F51.01 temazepam (RESTORIL) 7.5 MG capsule    5. Bereavement  Z63.4       Past Psychiatric History: Reviewed past psychiatric history from progress note on 08/21/2017.  Past trials of medications- Zoloft Cymbalta Effexor Paxil Prozac Wellbutrin Prazosin Seroquel Trazodone Doxepin  Past Medical History:  Past Medical History:  Diagnosis Date   Anxiety    Basal cell carcinoma 05/03/2016   Left deltoid. Superficial.   Basal cell carcinoma 05/03/2016   Left distal lat. tricep. Nodular.   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 Dossie Arbour Serial number: [id number] Model number: [id number]     Past Surgical History:  Procedure Laterality Date   ABDOMINAL HYSTERECTOMY     APPENDECTOMY      BACK SURGERY     X 3   BREAST EXCISIONAL BIOPSY Right    surgical bx age 54    CHOLECYSTECTOMY  09/2020   COLONOSCOPY N/A 08/28/2013   Procedure: COLONOSCOPY;  Surgeon: Danie Binder, MD;  [facility];  Service: Endoscopy;  Laterality: N/A;  8:30 AM   ENDOSCOPIC RETROGRADE CHOLANGIOPANCREATOGRAPHY (ERCP) WITH PROPOFOL N/A 09/20/2020   Procedure: ENDOSCOPIC RETROGRADE CHOLANGIOPANCREATOGRAPHY (ERCP) WITH PROPOFOL;  Surgeon: Lucilla Lame, MD;  Location: [facility] ENDOSCOPY;  Service: Endoscopy;  Laterality: N/A;   INTRAMEDULLARY (IM) NAIL INTERTROCHANTERIC Left 05/30/2016   Procedure: INTRAMEDULLARY (IM) NAIL INTERTROCHANTRIC;  Surgeon: Thornton Park, MD;  Location: [facility] ORS;  Service: Orthopedics;  Laterality: Left;   SHOULDER SURGERY Right    Spinal cord stimulator     SPINAL CORD STIMULATOR REMOVAL N/A 01/12/2020   Procedure: REMOVAL SPINAL CORD STIMULATOR AND PULSE GENERATOR;  Surgeon: Deetta Perla, MD;  Location: [facility] ORS;  Service: Neurosurgery;  Laterality: N/A;  Local w/ MAC    Family Psychiatric History: Reviewed family psychiatric history from progress note on 08/21/2017  Family History:  Family History  Problem Relation Age of Onset   Diabetes Mother    COPD Mother    Heart disease Mother    Diabetes Father    COPD Father    Heart disease Father    Heart attack Father 25       CABG   Alcohol abuse Daughter    Depression Daughter    Drug abuse Daughter    Anxiety disorder Daughter    Tuberculosis Maternal Aunt    Cancer Paternal Aunt        breast   Breast cancer Paternal Aunt    Diabetes Paternal Grandmother    Diabetes Sister    Cancer Cousin    Diabetes Cousin    Colon cancer Neg Hx    Sudden Cardiac Death Neg Hx     Social History: Reviewed social history from progress note on 08/21/2017 Social History   Socioeconomic History   Marital status: Divorced    Spouse name: Not on file   Number of children: 2   Years of education: Not on file   Highest  education level: High school graduate  Occupational History    Comment: disabled  Tobacco Use   Smoking status: Former    Packs/day: 0.25    Years: 14.00    Pack years: 3.50    Types: Cigarettes    Quit date: 1992    Years since quitting: 30.4   Smokeless tobacco: Never  Vaping Use   Vaping Use: Never used  Substance and Sexual Activity   Alcohol use: Yes    Comment: occasional; once every 6 months   Drug use: No   Sexual activity: Yes    Birth control/protection: Surgical  Other Topics Concern   Not on file  Social History Narrative   Not on file   Social Determinants of Health   Financial Resource Strain: Not on file  Food Insecurity: Not on file  Transportation Needs: Not on  file  Physical Activity: Not on file  Stress: Not on file  Social Connections: Not on file    Allergies:  Allergies  Allergen Reactions   Levofloxacin Hives and Nausea And Vomiting   Tramadol Other (See Comments)    Severe headache   Codeine Nausea Only    Slight nausea   Mobic [Meloxicam] Other (See Comments)    Calf tightness   Propoxyphene Hcl Rash    Metabolic Disorder Labs: Lab Results  Component Value Date   HGBA1C 5.4 05/29/2016   MPG 108 05/29/2016   No results found for: PROLACTIN Lab Results  Component Value Date   CHOL 198 07/08/2018   TRIG 371 (H) 07/08/2018   HDL 46 07/08/2018   CHOLHDL 4.3 07/08/2018   VLDL 59 (H) 09/29/2016   LDLCALC 78 07/08/2018   LDLCALC 106 (H) 09/29/2016   Lab Results  Component Value Date   TSH 3.700 09/19/2020   TSH 0.932 07/14/2020    Therapeutic Level Labs: No results found for: LITHIUM No results found for: VALPROATE No components found for:  CBMZ  Current Medications: Current Outpatient Medications  Medication Sig Dispense Refill   atorvastatin (LIPITOR) 40 MG tablet Take 40 mg by mouth daily. PM     cetirizine (ZYRTEC) 10 MG tablet Take 10 mg by mouth daily.     Cholecalciferol (VITAMIN D3) 5000 units TABS Take 5,000  Units by mouth daily.     dexlansoprazole (DEXILANT) 60 MG capsule Take 60 mg by mouth daily.      famotidine (PEPCID) 40 MG tablet Take 40 mg by mouth daily.      hydrOXYzine (VISTARIL) 25 MG capsule Take 1 capsule (25 mg total) by mouth 3 (three) times daily as needed for anxiety. 90 capsule 1   metoprolol succinate (TOPROL-XL) 25 MG 24 hr tablet Take 1 tablet (25 mg total) by mouth daily.     Multiple Vitamin (MULTIVITAMIN) tablet Take 1 tablet by mouth daily.     polyethylene glycol (MIRALAX) 17 g packet Take 17 g by mouth daily as needed for moderate constipation. 14 each 0   potassium chloride (KLOR-CON) 20 MEQ packet Take 20 mEq by mouth 2 (two) times daily.     Vilazodone HCl (VIIBRYD) 40 MG TABS Take 1 tablet (40 mg total) by mouth daily. DAILY 30 tablet 3   temazepam (RESTORIL) 7.5 MG capsule Take 2 capsules (15 mg total) by mouth at bedtime as needed for sleep. 30 capsule 0   No current facility-administered medications for this visit.     Musculoskeletal: Strength & Muscle Tone:  UTA Gait & Station: normal Patient leans: N/A  Psychiatric Specialty Exam: Review of Systems  Psychiatric/Behavioral:  Positive for sleep disturbance. The patient is nervous/anxious.   All other systems reviewed and are negative.  Last menstrual period 09/30/1991.There is no height or weight on file to calculate BMI.  General Appearance: Casual  Eye Contact:  Fair  Speech:  Clear and Coherent  Volume:  Normal  Mood:  Anxious  Affect:  Congruent  Thought Process:  Goal Directed and Descriptions of Associations: Intact  Orientation:  Full (Time, Place, and Person)  Thought Content: Logical   Suicidal Thoughts:  No  Homicidal Thoughts:  No  Memory:  Immediate;   Fair Recent;   Fair Remote;   Fair  Judgement:  Fair  Insight:  Fair  Psychomotor Activity:  Normal  Concentration:  Concentration: Fair and Attention Span: Fair  Recall:  [employer] of Knowledge: Fair  Language: Fair   Akathisia:  No  Handed:  Right  AIMS (if indicated): not done  Assets:  Communication Skills Desire for Improvement Housing Social Support Transportation  ADL's:  Intact  Cognition: WNL  Sleep:  Poor   Screenings: GAD-7    Flowsheet Row Video Visit from 11/16/2020 in [place] from 11/02/2020 in [place]  Total GAD-7 Score 17 11      PHQ2-9    [place] Video Visit from 11/16/2020 in [place] from 11/02/2020 in [facility] Video Visit from 10/06/2020 in [facility] Visit from 07/30/2020 in [facility] Video Visit from 04/16/2020 in [place]  PHQ-2 Total Score _0 PHQ-9 Total Score _1 Flowsheet Row Video Visit from 11/16/2020 in [facility] from 11/02/2020 in [place] Video Visit from 10/06/2020 in [place] No Risk No Risk No Risk        Assessment and Plan: Wanda [name] is a 64 year old Caucasian female, widowed, lives in [place], has a history of depression, anxiety, chronic pain, SVT was evaluated by telemedicine today.  Patient is currently struggling with anxiety, sleep problems.  Plan as noted below.  Plan MDD in partial remission PHQ-9 today equals 12 Viibryd 40 mg p.o. daily Continue CBT  GAD-unstable GAD-7 equals 17 Restart hydroxyzine 25 mg p.o. twice daily as needed.  Advised to limit use Viibryd as prescribed  Social anxiety disorder-improving Continue CBT  Insomnia-unstable Increase temazepam to 15 mg p.o. nightly Discussed sleep hygiene techniques Advised patient to use hydroxyzine 25 mg as needed for sleep.  Bereavement-improving Continue grief counseling  Reviewed  notes per cardiology-Ms. Lorenso Quarry PA -dated November 04, 2020-hydroxyzine should be fine from a cardiac standpoint.   Follow-up in clinic in 4 weeks or sooner if needed.  However patient advised to call the clinic back in a couple of days to discuss further medication changes if sleep medication does not work.  This note was generated in part or whole with voice recognition software. Voice recognition is usually quite accurate but there are transcription errors that can and very often do occur. I apologize for any typographical errors that were not detected and corrected.     Ursula Alert, MD 11/17/2020, 8:27 AM

## 2020-11-19 ENCOUNTER — Telehealth: Payer: Self-pay

## 2020-11-19 NOTE — Telephone Encounter (Signed)
pt states that the medication is not helping with her sleep .  Can anything else be done.

## 2020-11-19 NOTE — Telephone Encounter (Signed)
Returned call to patient.  Discussed readjusting the dose.  Patient however reports she does not want to make any changes.  Discussed referral sleep clinic.  She is not interested.  Patient reports she will reach out to writer in a couple of weeks to discuss any other changes.

## 2020-12-02 ENCOUNTER — Other Ambulatory Visit: Payer: Self-pay

## 2020-12-02 ENCOUNTER — Ambulatory Visit: Payer: Medicare HMO | Admitting: Medical

## 2020-12-02 ENCOUNTER — Encounter: Payer: Self-pay | Admitting: Medical

## 2020-12-02 VITALS — BP 136/86 | HR 108 | Ht 61.0 in | Wt 143.0 lb

## 2020-12-02 DIAGNOSIS — R000 Tachycardia, unspecified: Secondary | ICD-10-CM | POA: Diagnosis not present

## 2020-12-02 DIAGNOSIS — I10 Essential (primary) hypertension: Secondary | ICD-10-CM | POA: Diagnosis not present

## 2020-12-02 DIAGNOSIS — E785 Hyperlipidemia, unspecified: Secondary | ICD-10-CM

## 2020-12-02 DIAGNOSIS — I471 Supraventricular tachycardia: Secondary | ICD-10-CM | POA: Diagnosis not present

## 2020-12-02 DIAGNOSIS — E782 Mixed hyperlipidemia: Secondary | ICD-10-CM

## 2020-12-02 DIAGNOSIS — I951 Orthostatic hypotension: Secondary | ICD-10-CM

## 2020-12-02 NOTE — Progress Notes (Signed)
Cardiology Office Note:    Date:  12/02/2020   ID:  Wanda, [name] 1955-08-23, MRN [id number]  PCP:  Wanda Boys, MD  [facility] Cardiologist:  Wanda Bush, MD  [facility] Electrophysiologist:  None   Referring MD: Wanda Boys, MD   Chief Complaint: 1 month follow-up  History of Present Illness:    Wanda [name] is a 65 y.o. female with a hx of orthostatic hypotension, sinus tachycardia, HLD, anxiety, depression who presents for 1 month follow-up.   Zio monitor, ordered for exertional dyspnea and palpitations, showed ST with rare ectopy, 4 runs of breif pSVT.   Seen back 09/02/20 and reported vague chest pain and racing heart beats  Echo 10/28/20 showed EF 60-65% G1DD  Last seen 10/29/20 and reported persistent tachypalpitations and anxiety. Recommended decrease caffeine intake and good fluid intake. She was started on propranolol, however did not tolerate this very well, and went back to metoprolol.   Today, the patient reports fluctuating blood pressures. She feels ok. She is worried about her BP dropping. She brought her Bps readings and it appears she doesn't take her BP consistently at the same time, takes it multiple times throughout the day. Some readings SBP in the 90s, however patient reports no lightheadedness or dizziness. She did not tolerate propanolol due to itching and sweaty hands. She feels her heart rate continues to race. She might have occasional chest pain twinge. No shortness of breath, LLE, pnd, orthopnea.   Past Medical History:  Diagnosis Date   Anxiety    Basal cell carcinoma 05/03/2016   Left deltoid. Superficial.   Basal cell carcinoma 05/03/2016   Left distal lat. tricep. Nodular.   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 Dossie Arbour Serial number: [id number] Model number: [id number]     Past Surgical History:  Procedure Laterality Date   ABDOMINAL HYSTERECTOMY     APPENDECTOMY     BACK SURGERY     X 3   BREAST EXCISIONAL BIOPSY Right    surgical bx age 51    CHOLECYSTECTOMY  09/2020   COLONOSCOPY N/A 08/28/2013   Procedure: COLONOSCOPY;  Surgeon: Wanda Binder, MD;  [facility];  Service: Endoscopy;  Laterality: N/A;  8:30 AM   ENDOSCOPIC RETROGRADE CHOLANGIOPANCREATOGRAPHY (ERCP) WITH PROPOFOL N/A 09/20/2020   Procedure: ENDOSCOPIC RETROGRADE CHOLANGIOPANCREATOGRAPHY (ERCP) WITH PROPOFOL;  Surgeon: Wanda Lame, MD;  Location: [facility] ENDOSCOPY;  Service: Endoscopy;  Laterality: N/A;   INTRAMEDULLARY (IM) NAIL INTERTROCHANTERIC Left 05/30/2016   Procedure: INTRAMEDULLARY (IM) NAIL INTERTROCHANTRIC;  Surgeon: Wanda Park, MD;  Location: [facility] ORS;  Service: Orthopedics;  Laterality: Left;   SHOULDER SURGERY Right    Spinal cord stimulator     SPINAL CORD STIMULATOR REMOVAL N/A  01/12/2020   Procedure: REMOVAL SPINAL CORD STIMULATOR AND PULSE GENERATOR;  Surgeon: Wanda Perla, MD;  Location: [facility] ORS;  Service: Neurosurgery;  Laterality: N/A;  Local w/ MAC    Current Medications: Current Meds  Medication Sig   atorvastatin (LIPITOR) 40 MG tablet Take 40 mg by mouth daily. PM   cetirizine (ZYRTEC) 10 MG tablet Take 10 mg by mouth daily.   Cholecalciferol (VITAMIN D3) 5000 units TABS Take 5,000 Units by mouth daily.   dexlansoprazole  (DEXILANT) 60 MG capsule Take 60 mg by mouth daily.    famotidine (PEPCID) 40 MG tablet Take 40 mg by mouth daily.    hydrOXYzine (VISTARIL) 25 MG capsule Take 1 capsule (25 mg total) by mouth 3 (three) times daily as needed for anxiety.   metoprolol succinate (TOPROL-XL) 25 MG 24 hr tablet Take 1 tablet (25 mg total) by mouth daily.   Multiple Vitamin (MULTIVITAMIN) tablet Take 1 tablet by mouth daily.   potassium chloride (KLOR-CON) 20 MEQ packet Take 20 mEq by mouth 2 (two) times daily.   temazepam (RESTORIL) 7.5 MG capsule Take 2 capsules (15 mg total) by mouth at bedtime as needed for sleep.   Vilazodone HCl (VIIBRYD) 40 MG TABS Take 1 tablet (40 mg total) by mouth daily. DAILY     Allergies:   Levofloxacin, Tramadol, Codeine, Mobic [meloxicam], and Propoxyphene hcl   Social History   Socioeconomic History   Marital status: Divorced    Spouse name: Not on file   Number of children: 2   Years of education: Not on file   Highest education level: High school graduate  Occupational History    Comment: disabled  Tobacco Use   Smoking status: Former    Packs/day: 0.25    Years: 14.00    Pack years: 3.50    Types: Cigarettes    Quit date: 1992    Years since quitting: 30.5   Smokeless tobacco: Never  Vaping Use   Vaping Use: Never used  Substance and Sexual Activity   Alcohol use: Yes    Comment: occasional; once every 6 months   Drug use: No   Sexual activity: Yes    Birth control/protection: Surgical  Other Topics Concern   Not on file  Social History Narrative   Not on file   Social Determinants of Health   Financial Resource Strain: Not on file  Food Insecurity: Not on file  Transportation Needs: Not on file  Physical Activity: Not on file  Stress: Not on file  Social Connections: Not on file     Family History: The patient's family history includes Alcohol abuse in her daughter; Anxiety disorder in her daughter; Breast cancer in her paternal aunt; COPD in her  father and mother; Cancer in her cousin and paternal aunt; Depression in her daughter; Diabetes in her cousin, father, mother, paternal grandmother, and sister; Drug abuse in her daughter; Heart attack (age of onset: 66) in her father; Heart disease in her father and mother; Tuberculosis in her maternal aunt. There is no history of Colon cancer or Sudden Cardiac Death.  ROS:   Please see the history of present illness.     All other systems reviewed and are negative.  EKGs/Labs/Other Studies Reviewed:    The following studies were reviewed today: Echo 10/28/20  1. Left ventricular ejection fraction, by estimation, is 60 to 65%. The  left ventricle has normal function. The left ventricle has no regional  wall motion abnormalities. Left ventricular diastolic parameters  are  consistent with Grade I diastolic  dysfunction (impaired relaxation).   2. Right ventricular systolic function is normal. The right ventricular  size is normal. Tricuspid regurgitation signal is inadequate for assessing  PA pressure.   08/18/20 Cardiac monitor The patient was monitored for 14 days. The predominant rhythm was sinus with an average rate of 93 bpm (range 55-180 bpm). There were rare PAC's and occasional PVC's (1.1% PVC burden). Four atrial runs lasting up to 23.3 seconds occurred with a maximum rate of 174 bpm. No sustained arrhythmia or prolonged pause was identified. Patient triggered event corresponds to sinus rhythm with isolated PVC. Predominantly sinus rhythm with rare PAC's and occasional PVC's.  PSVT also noted (see details above).   Echo 05/2016 - Procedure narrative: Transthoracic echocardiography. The study    was technically difficult.  - Left ventricle: Systolic function was normal. The estimated    ejection fraction was in the range of 60% to 65%.  - Aortic valve: Valve area (Vmax): 2.13 cm^2.   Long term monitor 05/2016 The patient was monitored for 13 days, 20 hours. The predominant  rhythm was sinus with an average rate of 84 bpm (range 52-170 bpm). Rare PACs and atrial couplets were identified. Rare isolated PVCs were also seen. There were 5 episodes of supraventricular tachycardia lasting up to 10.8 seconds with a maximal rate of 152 bpm. There were no sustained arrhythmias or prolonged pauses. There were no patient triggered episodes. Predominantly sinus rhythm with rare PACs and PVCs, as well as paroxysmal SVT.   Carotids 2018 IMPRESSION: Mild bilateral carotid atherosclerotic vascular disease. No flow limiting stenosis. Degree of stenosis less than 50% bilaterally. 2. Vertebral arteries are patent with antegrade flow.  EKG:  EKG is ordered today.  The ekg ordered today demonstrates ST, 108bpm, PRI 178m, no ischemic changes, QTC 4566m Recent Labs: 09/19/2020: TSH 3.700 09/20/2020: Magnesium 1.8 09/21/2020: ALT 86; BUN 5; Creatinine, Ser 0.58; Hemoglobin 12.1; Platelets 238; Potassium 3.9; Sodium 139  Recent Lipid Panel    Component Value Date/Time   CHOL 198 07/08/2018 1018   TRIG 371 (H) 07/08/2018 1018   HDL 46 07/08/2018 1018   CHOLHDL 4.3 07/08/2018 1018   CHOLHDL 5.0 09/29/2016 0731   VLDL 59 (H) 09/29/2016 0731   LDLCALC 78 07/08/2018 1018   LDLDIRECT 197.3 02/01/2010 0907    Physical Exam:    VS:  BP 136/86 (BP Location: Left Arm, Patient Position: Sitting, Cuff Size: Normal)   Pulse (!) 108   Ht _0  (1.549 m)   Wt 143 lb (64.9 kg)   LMP 09/30/1991 (Approximate) Comment: 1993  SpO2 98%   BMI 27.02 kg/m     Wt Readings from Last 3 Encounters:  12/02/20 143 lb (64.9 kg)  10/29/20 144 lb (65.3 kg)  10/06/20 144 lb 3.2 oz (65.4 kg)     GEN:  Well nourished, well developed in no acute distress HEENT: Normal NECK: No JVD; No carotid bruits LYMPHATICS: No lymphadenopathy CARDIAC: tachycardia, RR, no murmurs, rubs, gallops RESPIRATORY:  Clear to auscultation without rales, wheezing or rhonchi  ABDOMEN: Soft, non-tender,  non-distended MUSCULOSKELETAL:  No edema; No deformity  SKIN: Warm and dry NEUROLOGIC:  Alert and oriented x 3 PSYCHIATRIC:  Normal affect   ASSESSMENT:    1. Essential hypertension   2. Mixed hyperlipidemia   3. Sinus tachycardia   4. Paroxysmal SVT (supraventricular tachycardia) ([place]  5. Orthostatic hypotension   6. 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 Prior heart monitor showed NSR average heart rate of 93bpm, brief SVT, rare PACs/PVCs. Prior echo showed normal EF. She is on Toprol-XL 14m daily and have been unable to titrate due to borderline pressures. Today she reports she has been feeling better, no significant lightheadedness or dizziness. Heart rate is still high, range 80s-120s. Bps range from 90-120s/60-70s, although she takes them at different times daily. She feels her heart racing almost daily. She denies chest pain or shortness of breath. Can consider Ivabradine, but concerned cost may be too high. Recommend good hydration and compression socks. Also discussed when she is too take her BP (2 hours after medications). She will call back in a week to report BPS. If Bps are better can consider increase BB.   HLD Lipid panel today. Continue atorvastatin 444maily.   Carotid artery stenosis, b/l Mild bilateral carotid atherosclerotic vascular disease by echo in 2018. No new symptoms. Consider repeat [place]at follow-up.   H/o orthostatic hypotension Reports occasional symptoms. Encouraged good hydration and compression socks. Continue BB.   Disposition: Follow up in 6 month(s) with Dr. EnSaunders Revel Signed, CaCrestwoodPA-C  12/02/2020 2:16 PM    [place]

## 2020-12-02 NOTE — Patient Instructions (Signed)
Medication Instructions:  No changes, please continue your current medications   If you need a refill on your cardiac medications before your next appointment, please call your pharmacy.   Lab Work: Fasting Lipid LABS WILL APPEAR ON MYCHART, ABNORMAL RESULTS WILL BE CALLED  Testing/Procedures: None   Follow-Up: At [facility], you and your health needs are our priority.  As part of our continuing mission to provide you with exceptional heart care, we have created designated Provider Care Teams.  These Care Teams include your primary Cardiologist (physician) and Advanced Practice Providers (APPs -  Physician Assistants and Nurse Practitioners) who all work together to provide you with the care you need, when you need it.   Your next appointment:   6 month(s)  The format for your next appointment:   In Person  Provider:   Nelva Bush, MD or Cadence Kathlen Mody, [place]   Other Instructions Please monitor blood pressures and keep a log of your readings.  Call the clinic in ONE week with BP readings.   How to use a home blood pressure monitor. Be still. Measure at the same time every day. It's important to take the readings at the same time each day, such as morning and evening. Take reading approximately 1 1/2 to 2 hours after BP medications.   AVOID these things for 30 minutes before checking your blood pressure: Drinking caffeine. Drinking alcohol. Eating. Smoking. Exercising.   Five minutes before checking your blood pressure: Pee. Sit in a dining chair. Avoid sitting in a soft couch or armchair. Be quiet. Do not talk.       Sit correctly. Sit with your back straight and supported (on a dining chair, rather than a sofa). Your feet should be flat on the floor and your legs should not be crossed. Your arm should be supported on a flat surface (such as a table) with the upper arm at heart level. Make sure the bottom of the cuff is placed directly above the bend of the elbow.   ONE

## 2020-12-03 ENCOUNTER — Telehealth: Payer: Self-pay | Admitting: Medical

## 2020-12-03 ENCOUNTER — Ambulatory Visit (INDEPENDENT_AMBULATORY_CARE_PROVIDER_SITE_OTHER): Payer: Medicare HMO | Admitting: Licensed Clinical Social Worker

## 2020-12-03 DIAGNOSIS — F331 Major depressive disorder, recurrent, moderate: Secondary | ICD-10-CM

## 2020-12-03 DIAGNOSIS — F411 Generalized anxiety disorder: Secondary | ICD-10-CM | POA: Diagnosis not present

## 2020-12-03 DIAGNOSIS — E782 Mixed hyperlipidemia: Secondary | ICD-10-CM

## 2020-12-03 DIAGNOSIS — Z634 Disappearance and death of family member: Secondary | ICD-10-CM

## 2020-12-03 LAB — LIPID PANEL
Chol/HDL Ratio: 3.4 ratio (ref 0.0–4.4)
Cholesterol, Total: 164 mg/dL (ref 100–199)
HDL: 48 mg/dL (ref >39)
LDL Chol Calc (NIH): 74 mg/dL (ref 0–99)
Triglycerides: 262 mg/dL — ABNORMAL HIGH (ref 0–149)
VLDL Cholesterol Cal: 42 mg/dL — ABNORMAL HIGH (ref 5–40)

## 2020-12-03 MED ORDER — ATORVASTATIN CALCIUM 80 MG PO TABS: 80.0000 mg | ORAL_TABLET | Freq: Every day | ORAL | 1 refills | Status: DC

## 2020-12-03 NOTE — Telephone Encounter (Signed)
Patient made aware of lab results and Cadence Furth, [place] recommendation. Patient is agreeable with increasing Atorvastatin to 80 mg daily. Rx sent to the patients pharmacy. Patient will [facility] the medical mall in 8 weeks for fasting lipid and lft. Patient verbalized understanding and voiced appreciation for the call.

## 2020-12-03 NOTE — Telephone Encounter (Signed)
Cadence Ninfa Meeker, PA-C  12/03/2020  1:05 PM EDT Back to Top     Triglycerides still moderately elevated. Increase statin to 80mg  daily. Recommend lifestyle changes (diet, exercise). Re-check lipid panel and LFTs in8 weeks

## 2020-12-03 NOTE — Progress Notes (Signed)
Virtual Visit via Video Note  I connected with Wanda [name] on 12/03/20 at 10:00 AM EDT by a video enabled telemedicine application and verified that I am speaking with the correct person using two identifiers.  Video connection was lost when less than 50% of the duration of the visit was complete, at which time the remainder of the visit was completed via audio only.  Location: Patient: home Provider: remote office [place], [place])   I discussed the limitations of evaluation and management by telemedicine and the availability of in person appointments. The patient expressed understanding and agreed to proceed.   I discussed the assessment and treatment plan with the patient. The patient was provided an opportunity to ask questions and all were answered. The patient agreed with the plan and demonstrated an understanding of the instructions.   The patient was advised to call back or seek an in-person evaluation if the symptoms worsen or if the condition fails to improve as anticipated.  I provided 60 minutes of non-face-to-face time during this encounter.   Wanda [name] R Wanda Armond, LCSW  THERAPIST PROGRESS NOTE  Session Time: 10-11a  Participation Level: Active  Behavioral Response: Neat and Well GroomedAlertAnxious and Depressed  Type of Therapy: Individual Therapy  Treatment Goals addressed: Anxiety and Coping  Interventions: CBT, Strength-based, Supportive, and Other: grief counseling; trauma focused  Summary: Wanda [name] is a 65 y.o. female who presents with continuing symptoms related to depression, bereavement, and anxiety diagnosis. Patient reports that she is continuing to have issues with sleep, and patient elects to go off of all of her sleep medications. Patient reports that she is getting some sleep, but not as much as she feels that she needs to function well. Discussed psychiatrist request that patient get a sleep study done in [place]--patient reports that she  has a lot of anxieties associated with this referral. Discussed specific anxieties like patient driving in unfamiliar locations, and patient unwilling to ask others for help. Discussed areas that patient feels more secure in--patient reports that she feels most secure in [place], but recognizes that not all specialists are going to have offices in [place]. Discussed the need to develop a plan of action to help patient develop some goals of getting the help that she needs and ability to travel to different cities to see specialist medical providers, as needed.   Allowed patient safe space to explore and express thoughts and feelings associated with loss of daughter. Allowed pt space to process through her grief, and the different feelings that she has towards her daughters husband, and the inability to see her grandchildren. Had patient explore the trauma of her daughter's death, and patient recalled many details about that day. Practice positive wrap around, and encouraged patient to focus on self care and recovery since today's session it was a very emotional session.  Continued recommendations are as follows: self care behaviors, positive social engagements, focusing on overall work/home/life balance, and focusing on positive physical and emotional wellness.   Suicidal/Homicidal: No  Therapist Response: Ladaisha is able to verbally identify the source of her depressed mood and is able to identify coping mechanisms that have been helpful in the past in managing her depressed mood. Patient is trying hard to participate in social events. Patient is able to describe specific signs and symptoms of her depression that she's experiencing. Patient is also taking steps towards recalling traumatic events without becoming overwhelmed with negative emotions. These behaviors are reflective of continued personal growth and progress.  Treatment to continue as indicated.  Plan: Return again in 4  weeks.  Diagnosis: Axis I: MDD, recurrent, moderate, GAD, bereavement    Axis II: No diagnosis    Wanda [name] Douglas Rooks, LCSW 12/03/2020

## 2020-12-09 ENCOUNTER — Telehealth: Payer: Self-pay | Admitting: Internal Medicine

## 2020-12-09 NOTE — Telephone Encounter (Signed)
I spoke with the patient. I advised her that we received her BP/ HR readings as stated below. I inquired how she is currently feeling and per the patient's report, she feels no different one way or the other. She advised she can't tell a difference in how she feels if her readings are high/ low.   She denies any current dizziness/ lightheadedness.   I have confirmed with her that she is still taking metoprolol succinate 25 mg once daily in the mornings and that her readings were taken at least 1 hour after her medications were taken.   The patient is aware I will forward her readings to Cadence Castalia, [place] and we will call her back once these are reviewed.   The patient voices understanding and is agreeable.

## 2020-12-09 NOTE — Telephone Encounter (Signed)
Patient calling with BP reading update that Read Drivers requested  July 9th 11:50a 89/62 HR 103 July 10th 10:55a 99/69 HR 105 July 11th 09:05a 106/80 HR 91 July 12th 09:40a 110/72 HR 113 July 13th 09:20a 102/66 HR 100  Please call to discuss

## 2020-12-13 ENCOUNTER — Other Ambulatory Visit (HOSPITAL_COMMUNITY): Payer: Self-pay | Admitting: Gastroenterology

## 2020-12-13 ENCOUNTER — Other Ambulatory Visit: Payer: Self-pay | Admitting: Gastroenterology

## 2020-12-13 DIAGNOSIS — R933 Abnormal findings on diagnostic imaging of other parts of digestive tract: Secondary | ICD-10-CM

## 2020-12-13 DIAGNOSIS — R197 Diarrhea, unspecified: Secondary | ICD-10-CM

## 2020-12-13 NOTE — Telephone Encounter (Signed)
Spoke with patient and reviewed recommendations from provider. She states she has been on metoprolol for some time. She went to see GI today and they also reviewed low blood pressures with concern. She told them that we were aware and are monitoring the situation. Instructed patient to continue monitoring of her blood pressures and to please give [place] a call back if she should have any symptoms with those low readings. She verbalized understanding of our conversation, agreement with plan, and had no further questions at this time.

## 2020-12-13 NOTE — Telephone Encounter (Signed)
Wanda [name], Wanda H, PA-C to Emily Filbert, RN    12/13/20  4:39 PM Overall reassuring she is asymptomatic. BP is borderline and Heart rate is intermittently tachycardia. If she is tolerating metoprolol dose would try andcontinue. Stay hydrated and mildly increase salt intake.

## 2020-12-16 ENCOUNTER — Other Ambulatory Visit: Payer: Self-pay

## 2020-12-16 ENCOUNTER — Encounter: Payer: Self-pay | Admitting: Psychiatry

## 2020-12-16 ENCOUNTER — Telehealth (INDEPENDENT_AMBULATORY_CARE_PROVIDER_SITE_OTHER): Payer: Medicare HMO | Admitting: Psychiatry

## 2020-12-16 DIAGNOSIS — F3342 Major depressive disorder, recurrent, in full remission: Secondary | ICD-10-CM

## 2020-12-16 DIAGNOSIS — F5101 Primary insomnia: Secondary | ICD-10-CM | POA: Diagnosis not present

## 2020-12-16 DIAGNOSIS — F3341 Major depressive disorder, recurrent, in partial remission: Secondary | ICD-10-CM | POA: Insufficient documentation

## 2020-12-16 DIAGNOSIS — F411 Generalized anxiety disorder: Secondary | ICD-10-CM | POA: Diagnosis not present

## 2020-12-16 DIAGNOSIS — F4010 Social phobia, unspecified: Secondary | ICD-10-CM | POA: Diagnosis not present

## 2020-12-16 DIAGNOSIS — Z634 Disappearance and death of family member: Secondary | ICD-10-CM

## 2020-12-16 MED ORDER — TEMAZEPAM 7.5 MG PO CAPS: 15.0000 mg | ORAL_CAPSULE | Freq: Every evening | ORAL | 1 refills | Status: DC | PRN

## 2020-12-16 NOTE — Progress Notes (Signed)
Virtual Visit via Video Note  I connected with Wanda [name] on 12/16/20 at  2:40 PM EDT by a video enabled telemedicine application and verified that I am speaking with the correct person using two identifiers.  Location Provider Location : [facility] Patient Location : Home  Participants: Patient , Provider    I discussed the limitations of evaluation and management by telemedicine and the availability of in person appointments. The patient expressed understanding and agreed to proceed.    I discussed the assessment and treatment plan with the patient. The patient was provided an opportunity to ask questions and all were answered. The patient agreed with the plan and demonstrated an understanding of the instructions.   The patient was advised to call back or seek an in-person evaluation if the symptoms worsen or if the condition fails to improve as anticipated.    [place] MD OP Progress Note  12/17/2020 8:35 AM TIONDRA FANG  MRN:  [id number]  Chief Complaint:  Chief Complaint   Follow-up; Depression; Anxiety    HPI: Wanda [name] is a 65 year old Caucasian female, widowed, on SSI, lives in [place], has a history of GAD, MDD, social anxiety disorder, SVT, orthostatic hypotension, hyperlipidemia, bereavement was evaluated by telemedicine today.  Patient today reports she has more good days than bad days now.  Her mood has improved.  She does go through episodes when she feels down at times.  However she has been able to push herself to get out of the house, go grocery shopping and take care of other chores.  She reports she started taking her atorvastatin during the day instead of night.  She also has been taking the hydroxyzine later at night.  The combination of hydroxyzine melatonin and the temazepam has been helping her to sleep for 6 to 7 hours straight.  She wakes up feeling rested.  Patient reports appetite is fair.  She does have good days and bad days however overall she is  eating sufficiently.  She denies any suicidality, homicidality or perceptual disturbances.  She continues to follow-up with her therapist, reports therapy sessions are beneficial.  Patient denies any other concerns today.  Visit Diagnosis:    ICD-10-CM   1. GAD (generalized anxiety disorder)  F41.1     2. MDD (major depressive disorder), recurrent, in full remission ([place])  F33.42     3. Social anxiety disorder  F40.10     4. Primary insomnia  F51.01 temazepam (RESTORIL) 7.5 MG capsule    DISCONTINUED: temazepam (RESTORIL) 7.5 MG capsule    5. Bereavement  Z63.4       Past Psychiatric History: Reviewed past psychiatric history from progress note on 08/21/2017.  Past trials of Zoloft, Cymbalta, Effexor, Paxil, Prozac, Wellbutrin, prazosin, Seroquel, trazodone, doxepin  Past Medical History:  Past Medical History:  Diagnosis Date   Anxiety    Basal cell carcinoma 05/03/2016   Left deltoid. Superficial.   Basal cell carcinoma 05/03/2016   Left distal lat. tricep. Nodular.   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 Dossie Arbour Serial number: [id number] Model number: [id number]     Past Surgical History:  Procedure Laterality Date   ABDOMINAL HYSTERECTOMY     APPENDECTOMY     BACK SURGERY     X 3   BREAST  EXCISIONAL BIOPSY Right    surgical bx age 63    CHOLECYSTECTOMY  09/2020   COLONOSCOPY N/A 08/28/2013   Procedure: COLONOSCOPY;  Surgeon: Danie Binder, MD;  Location: [facility];  Service: Endoscopy;  Laterality: N/A;  8:30 AM   ENDOSCOPIC RETROGRADE CHOLANGIOPANCREATOGRAPHY (ERCP) WITH PROPOFOL N/A 09/20/2020   Procedure: ENDOSCOPIC RETROGRADE CHOLANGIOPANCREATOGRAPHY (ERCP) WITH PROPOFOL;  Surgeon: Lucilla Lame, MD;  Location: [facility] ENDOSCOPY;  Service: Endoscopy;  Laterality: N/A;   INTRAMEDULLARY (IM) NAIL INTERTROCHANTERIC Left 05/30/2016   Procedure: INTRAMEDULLARY (IM) NAIL INTERTROCHANTRIC;  Surgeon: Thornton Park, MD;  Location: [facility] ORS;  Service: Orthopedics;  Laterality: Left;   SHOULDER SURGERY Right    Spinal cord stimulator     SPINAL CORD STIMULATOR REMOVAL N/A 01/12/2020   Procedure: REMOVAL SPINAL CORD STIMULATOR AND PULSE GENERATOR;  Surgeon: Deetta Perla, MD;  Location: [facility] ORS;  Service: Neurosurgery;  Laterality: N/A;  Local w/ MAC    Family Psychiatric History: Reviewed family psychiatric history from progress note on 08/21/2017  Family History:  Family History  Problem Relation Age of Onset   Diabetes Mother    COPD Mother    Heart disease Mother    Diabetes Father    COPD Father    Heart disease Father    Heart attack Father 43       CABG   Alcohol abuse Daughter    Depression Daughter    Drug abuse Daughter    Anxiety disorder Daughter    Tuberculosis Maternal Aunt    Cancer Paternal Aunt        breast   Breast cancer Paternal Aunt    Diabetes Paternal Grandmother    Diabetes Sister    Cancer Cousin    Diabetes Cousin    Colon cancer Neg Hx    Sudden Cardiac Death Neg Hx     Social History: Reviewed social history from progress note on 08/21/2017 Social History   Socioeconomic History   Marital status: Divorced    Spouse name: Not on file   Number of children: 2   Years of education: Not on file   Highest education level: High school  graduate  Occupational History    Comment: disabled  Tobacco Use   Smoking status: Former    Packs/day: 0.25    Years: 14.00    Pack years: 3.50    Types: Cigarettes    Quit date: 1992    Years since quitting: 30.5   Smokeless tobacco: Never  Vaping Use   Vaping Use: Never used  Substance and Sexual Activity   Alcohol use: Yes    Comment: occasional; once every 6 months   Drug use: No   Sexual activity: Yes    Birth control/protection: Surgical  Other Topics Concern   Not on file  Social History Narrative   Not on file   Social Determinants of Health   Financial Resource Strain: Not on file  Food  Insecurity: Not on file  Transportation Needs: Not on file  Physical Activity: Not on file  Stress: Not on file  Social Connections: Not on file    Allergies:  Allergies  Allergen Reactions   Levofloxacin Hives and Nausea And Vomiting   Tramadol Other (See Comments)    Severe headache   Pantoprazole Itching   Codeine Nausea Only    Slight nausea   Mobic [Meloxicam] Other (See Comments)    Calf tightness   Propoxyphene Hcl Rash    Metabolic Disorder Labs: Lab Results  Component Value Date   HGBA1C 5.4 05/29/2016   MPG 108 05/29/2016   No results found for: PROLACTIN Lab Results  Component Value Date   CHOL 164 12/02/2020   TRIG 262 (H) 12/02/2020   HDL 48 12/02/2020   CHOLHDL 3.4 12/02/2020   VLDL 59 (H) 09/29/2016   LDLCALC 74 12/02/2020   LDLCALC 78 07/08/2018   Lab Results  Component Value Date   TSH 3.700 09/19/2020   TSH 0.932 07/14/2020    Therapeutic Level Labs: No results found for: LITHIUM No results found for: VALPROATE No components found for:  CBMZ  Current Medications: Current Outpatient Medications  Medication Sig Dispense Refill   famotidine (PEPCID) 40 MG tablet Take by mouth.     atorvastatin (LIPITOR) 80 MG tablet Take 1 tablet (80 mg total) by mouth daily. PM 90 tablet 1   cetirizine (ZYRTEC) 10 MG tablet Take 10 mg by mouth  daily.     Cholecalciferol (VITAMIN D3) 5000 units TABS Take 5,000 Units by mouth daily.     dexlansoprazole (DEXILANT) 60 MG capsule Take 60 mg by mouth daily.      famotidine (PEPCID) 40 MG tablet Take 40 mg by mouth daily.      hydrOXYzine (VISTARIL) 25 MG capsule Take 1 capsule (25 mg total) by mouth 3 (three) times daily as needed for anxiety. 90 capsule 1   metoprolol succinate (TOPROL-XL) 25 MG 24 hr tablet Take 1 tablet (25 mg total) by mouth daily.     Multiple Vitamin (MULTIVITAMIN) tablet Take 1 tablet by mouth daily.     potassium chloride (KLOR-CON) 20 MEQ packet Take 20 mEq by mouth 2 (two) times daily.     temazepam (RESTORIL) 7.5 MG capsule Take 2 capsules (15 mg total) by mouth at bedtime as needed for sleep. 30 capsule 1   Vilazodone HCl (VIIBRYD) 40 MG TABS Take 1 tablet (40 mg total) by mouth daily. DAILY 30 tablet 3   No current facility-administered medications for this visit.     Musculoskeletal: Strength & Muscle Tone:  UTA Gait & Station:  UTA Patient leans: N/A  Psychiatric Specialty Exam: Review of Systems  Psychiatric/Behavioral:  Positive for dysphoric mood.   All other systems reviewed and are negative.  Last menstrual period 09/30/1991.There is no height or weight on file to calculate BMI.  General Appearance: Casual  Eye Contact:  Fair  Speech:  Clear and Coherent  Volume:  Normal  Mood:  Dysphoric improving , more good days   Affect:  Congruent  Thought Process:  Goal Directed and Descriptions of Associations: Intact  Orientation:  Full (Time, Place, and Person)  Thought Content: Logical   Suicidal Thoughts:  No  Homicidal Thoughts:  No  Memory:  Immediate;   Fair Recent;   Fair Remote;   Fair  Judgement:  Fair  Insight:  Fair  Psychomotor Activity:  Normal  Concentration:  Concentration: Fair and Attention Span: Fair  Recall:  Smiley Houseman of Knowledge: Fair  Language: Fair  Akathisia:  No  Handed:  Right  AIMS (if indicated): not done   Assets:  Communication Skills Desire for Improvement Transportation  ADL's:  Intact  Cognition: WNL  Sleep:   Improving   Screenings: GAD-7    Flowsheet Row Video Visit from 11/16/2020 in [place] Counselor from 11/02/2020 in [place]  Total GAD-7 Score 17 11      PHQ2-9    [place] Video Visit from 12/16/2020 in [place] Video Visit from 11/16/2020 in [place] Counselor from 11/02/2020 in [place] Video Visit from 10/06/2020 in [place] Visit from 07/30/2020 in [place]  PHQ-2 Total Score _0 PHQ-9 Total Score _1 Flowsheet Row Counselor from 12/03/2020 in [place] Video Visit from 11/16/2020 in [place] Counselor from 11/02/2020 in [place] No Risk No Risk No Risk        Assessment and Plan: Wanda [name] is a 65 year old Caucasian female, widowed, lives in [place], has a history of depression, anxiety, chronic pain, SVT was evaluated by telemedicine today.  Patient is currently stable.  Plan as noted below.  Plan MDD in remission Viibryd 40 mg p.o. daily Continue CBT  GAD-improving Viibryd as prescribed Hydroxyzine 25 mg p.o. twice daily as needed  Social anxiety disorder-improving Continue CBT  Insomnia-improving Temazepam 15 mg p.o. nightly Melatonin 6 mg p.o. nightly  Bereavement-improving Continue CBT  Follow-up in clinic in 2 months or sooner if needed.  This note was generated in part or whole with voice recognition software. Voice recognition is usually quite accurate but there are transcription errors that can and very often do occur. I apologize for any typographical errors that were not detected  and corrected.         Ursula Alert, MD 12/17/2020, 8:35 AM

## 2020-12-22 ENCOUNTER — Ambulatory Visit: Payer: Medicare HMO

## 2020-12-25 ENCOUNTER — Other Ambulatory Visit: Payer: Self-pay | Admitting: Internal Medicine

## 2020-12-27 ENCOUNTER — Other Ambulatory Visit: Payer: Self-pay

## 2020-12-27 ENCOUNTER — Ambulatory Visit
Admission: RE | Admit: 2020-12-27 | Discharge: 2020-12-27 | Disposition: A | Discharge status: Home / Self Care | Payer: Medicare HMO | Source: Ambulatory Visit | Attending: Gastroenterology | Admitting: Gastroenterology

## 2020-12-27 DIAGNOSIS — R197 Diarrhea, unspecified: Secondary | ICD-10-CM | POA: Diagnosis present

## 2020-12-27 DIAGNOSIS — R933 Abnormal findings on diagnostic imaging of other parts of digestive tract: Secondary | ICD-10-CM | POA: Diagnosis not present

## 2020-12-27 MED ORDER — GADOBUTROL 1 MMOL/ML IV SOLN
6.0000 mL | Freq: Once | INTRAVENOUS | Status: AC | PRN
  Administered 2020-12-27: 6 mL via INTRAVENOUS

## 2021-01-13 ENCOUNTER — Other Ambulatory Visit: Payer: Self-pay

## 2021-01-13 ENCOUNTER — Ambulatory Visit (INDEPENDENT_AMBULATORY_CARE_PROVIDER_SITE_OTHER): Payer: Medicare HMO | Admitting: Licensed Clinical Social Worker

## 2021-01-13 DIAGNOSIS — F411 Generalized anxiety disorder: Secondary | ICD-10-CM | POA: Diagnosis not present

## 2021-01-13 DIAGNOSIS — F3342 Major depressive disorder, recurrent, in full remission: Secondary | ICD-10-CM

## 2021-01-13 NOTE — Progress Notes (Signed)
Virtual Visit via Video Note  I connected with Wanda [name] on 01/13/21 at 10:00 AM EDT by a video enabled telemedicine application and verified that I am speaking with the correct person using two identifiers.  Location: Patient: home Provider: remote office [place], [place])   I discussed the limitations of evaluation and management by telemedicine and the availability of in person appointments. The patient expressed understanding and agreed to proceed.   I discussed the assessment and treatment plan with the patient. The patient was provided an opportunity to ask questions and all were answered. The patient agreed with the plan and demonstrated an understanding of the instructions.   The patient was advised to call back or seek an in-person evaluation if the symptoms worsen or if the condition fails to improve as anticipated.  I provided 39 minutes of non-face-to-face time during this encounter.   Samson Ralph R Dafina Suk, LCSW   THERAPIST PROGRESS NOTE  Session Time: 10-10:39a  Participation Level: Active  Behavioral Response: Neat and Well GroomedAlertAnxious and Depressed  Type of Therapy: Individual Therapy  Treatment Goals addressed: Anxiety, Coping, and Diagnosis: depression  Interventions: CBT  Summary: Wanda [name] is a 65 y.o. female who presents with continuing symptoms related to depression diagnosis. Patient reports that she is continuing to wake up in the middle of the night, so that is impacting our overall sleep quality. Reviewed sleep hygiene techniques. Patient reports that she was given a new medication, so she is allowing time for this medication to work. Patient reports overall mood is stable, in that she is managing situational stressors well. Allow patient safe space to explore and express thoughts and feelings associated with recent external stressors. Patient reports that her appetite has been OK, and that she is trying to focus on genealogy research,  which makes her feel productive. Patient reports that she is not getting out and about much, but is content staying within her home. Patient reports good relationship with son, and that he frequently reminds her of things that she needs to do and is helping her with accountability. Discussed thoughts and feelings associated with the recent news that her oldest sister has pancreatic cancer.Continued recommendations are as follows: self care behaviors, positive social engagements, focusing on overall work/home/life balance, and focusing on positive physical and emotional wellness.  .   Suicidal/Homicidal: No  Therapist Response: Shaianne is able to verbally identify the source of her depressed mood and is able to identify coping mechanisms that have been helpful in the past in managing her depressed mood. Patient is trying hard to participate in social events. Patient is able to describe specific signs and symptoms of her depression that she's experiencing. Patient is also taking steps towards recalling traumatic events without becoming overwhelmed with negative emotions. These behaviors are reflective of continued personal growth and progress. Treatment to continue as indicated.  Plan: Return again in 4 weeks.  Diagnosis: Axis I: MDD, recurrent, moderate    Axis II: No diagnosis    [place], LCSW 01/13/2021

## 2021-01-17 ENCOUNTER — Other Ambulatory Visit: Payer: Self-pay

## 2021-01-17 ENCOUNTER — Telehealth (INDEPENDENT_AMBULATORY_CARE_PROVIDER_SITE_OTHER): Payer: Medicare HMO | Admitting: Psychiatry

## 2021-01-17 ENCOUNTER — Encounter: Payer: Self-pay | Admitting: Psychiatry

## 2021-01-17 DIAGNOSIS — F3342 Major depressive disorder, recurrent, in full remission: Secondary | ICD-10-CM

## 2021-01-17 DIAGNOSIS — F4010 Social phobia, unspecified: Secondary | ICD-10-CM | POA: Diagnosis not present

## 2021-01-17 DIAGNOSIS — F5101 Primary insomnia: Secondary | ICD-10-CM | POA: Diagnosis not present

## 2021-01-17 DIAGNOSIS — Z634 Disappearance and death of family member: Secondary | ICD-10-CM

## 2021-01-17 DIAGNOSIS — F411 Generalized anxiety disorder: Secondary | ICD-10-CM | POA: Diagnosis not present

## 2021-01-17 MED ORDER — HYDROXYZINE PAMOATE 25 MG PO CAPS: 25.0000 mg | ORAL_CAPSULE | Freq: Three times a day (TID) | ORAL | 1 refills | Status: DC | PRN

## 2021-01-17 NOTE — Progress Notes (Signed)
Virtual Visit via Telephone Note  I connected with Wanda [name] on 01/17/21 at  3:30 PM EDT by telephone and verified that I am speaking with the correct person using two identifiers.  Location Provider Location : [facility] Patient Location : Home  Participants: Patient , Provider   I discussed the limitations, risks, security and privacy concerns of performing an evaluation and management service by telephone and the availability of in person appointments. I also discussed with the patient that there may be a patient responsible charge related to this service. The patient expressed understanding and agreed to proceed.   I discussed the assessment and treatment plan with the patient. The patient was provided an opportunity to ask questions and all were answered. The patient agreed with the plan and demonstrated an understanding of the instructions.   The patient was advised to call back or seek an in-person evaluation if the symptoms worsen or if the condition fails to improve as anticipated.  [facility] MD OP Progress Note  01/18/2021 4:25 PM Wanda [name]  MRN:  [id number]  Chief Complaint:  Chief Complaint   Follow-up; Anxiety; Depression    HPI: Wanda [name] is a 65 year old Caucasian female, widowed, on SSI, lives in [place], has a history of GAD, MDD, social anxiety disorder, SVT, orthostatic hypotension, hyperlipidemia, bereavement was evaluated by telemedicine today.  Patient today reports she is currently feeling better with regards to her mood.  She has been occupying herself by taking up new projects, found a new hobby, genealogy.  That helps her to stay busy.  She also had reconstruction done at her home which also kind of keeping her busy.  She reports sleep as restless when she took the temazepam.  She woke up with a headache which lasted till noon.  She also had itching of her skin.  She hence stopped taking the temazepam and is currently taking an over-the-counter sleep  medication that has melatonin, magnesium and theanine.  That does help her to sleep better.  Patient continues to follow-up with her therapist.  She denies any suicidality, homicidality or perceptual disturbances.  Patient denies any other concerns today.  Visit Diagnosis:    ICD-10-CM   1. GAD (generalized anxiety disorder)  F41.1 hydrOXYzine (VISTARIL) 25 MG capsule    2. MDD (major depressive disorder), recurrent, in full remission ([place])  F33.42     3. Social anxiety disorder  F40.10 hydrOXYzine (VISTARIL) 25 MG capsule    4. Primary insomnia  F51.01     5. Bereavement  Z63.4       Past Psychiatric History: Reviewed past psychiatric history from progress note on 08/21/2017.  Past trials of Zoloft, Cymbalta, Effexor, Paxil, Prozac, Wellbutrin, prazosin, Seroquel, trazodone, doxepin, temazepam  Past Medical History:  Past Medical History:  Diagnosis Date   Anxiety    Basal cell carcinoma 05/03/2016   Left deltoid. Superficial.   Basal cell carcinoma 05/03/2016   Left distal lat. tricep. Nodular.   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 Dossie Arbour Serial number: [id number] Model number: [id number]     Past Surgical History:  Procedure Laterality Date   ABDOMINAL HYSTERECTOMY     APPENDECTOMY     BACK  SURGERY     X 3   BREAST EXCISIONAL BIOPSY Right    surgical bx age 21    CHOLECYSTECTOMY  09/2020   COLONOSCOPY N/A 08/28/2013   Procedure: COLONOSCOPY;  Surgeon: Danie Binder, MD;  [facility]: [facility];  Service: Endoscopy;  Laterality: N/A;  8:30 AM   ENDOSCOPIC RETROGRADE CHOLANGIOPANCREATOGRAPHY (ERCP) WITH PROPOFOL N/A 09/20/2020   Procedure: ENDOSCOPIC RETROGRADE CHOLANGIOPANCREATOGRAPHY (ERCP) WITH PROPOFOL;  Surgeon: Lucilla Lame, MD;  Location: [facility] END[facility]COPY;  Service: Endoscopy;  Laterality: N/A;   INTRAMEDULLARY (IM) NAIL INTERTROCHANTERIC Left 05/30/2016   Procedure: INTRAMEDULLARY (IM) NAIL INTERTROCHANTRIC;  Surgeon: Thornton Park, MD;  Location: [facility] ORS;  Service: Orthopedics;  Laterality: Left;   SHOULDER SURGERY Right    Spinal cord stimulator     SPINAL CORD STIMULATOR REMOVAL N/A 01/12/2020   Procedure: REMOVAL SPINAL CORD STIMULATOR AND PULSE GENERATOR;  Surgeon: Deetta Perla, MD;  Location: [facility] ORS;  Service: Neurosurgery;  Laterality: N/A;  Local w/ MAC    Family Psychiatric History: Reviewed family psychiatric history from progress note on 08/21/2017  Family History:  Family History  Problem Relation Age of Onset   Diabetes Mother    COPD Mother    Heart disease Mother    Diabetes Father    COPD Father    Heart disease Father    Heart attack Father 38       CABG   Alcohol abuse Daughter    Depression Daughter    Drug abuse Daughter    Anxiety disorder Daughter    Tuberculosis Maternal Aunt    Cancer Paternal Aunt        breast   Breast cancer Paternal Aunt    Diabetes Paternal Grandmother    Diabetes Sister    Cancer Cousin    Diabetes Cousin    Colon cancer Neg Hx    Sudden Cardiac Death Neg Hx     Social History: Reviewed social history from progress note on 08/21/2017 Social History   Socioeconomic History   Marital status: Divorced    Spouse name: Not on file   Number of children: 2   Years of education: Not on file   Highest  education level: High school graduate  Occupational History    Comment: disabled  Tobacco Use   Smoking status: Former    Packs/day: 0.25    Years: 14.00    Pack years: 3.50    Types: Cigarettes    Quit date: 1992    Years since quitting: 30.6   Smokeless tobacco: Never  Vaping Use   Vaping Use: Never used  Substance and Sexual Activity   Alcohol use: Yes    Comment: occasional; once every 6 months   Drug use: No   Sexual activity: Yes    Birth control/protection: Surgical  Other Topics Concern   Not on file  Social History Narrative   Not on file   Social Determinants of Health   Financial Resource Strain: Not on file  Food Insecurity: Not on file  Transportation Needs: Not on  file  Physical Activity: Not on file  Stress: Not on file  Social Connections: Not on file    Allergies:  Allergies  Allergen Reactions   Levofloxacin Hives and Nausea And Vomiting   Tramadol Other (See Comments)    Severe headache   Pantoprazole Itching   Codeine Nausea Only    Slight nausea   Mobic [Meloxicam] Other (See Comments)    Calf tightness   Propoxyphene Hcl Rash    Metabolic Disorder Labs: Lab Results  Component Value Date   HGBA1C 5.4 05/29/2016   MPG 108 05/29/2016   No results found for: PROLACTIN Lab Results  Component Value Date   CHOL 164 12/02/2020   TRIG 262 (H) 12/02/2020   HDL 48 12/02/2020   CHOLHDL 3.4 12/02/2020   VLDL 59 (H) 09/29/2016   LDLCALC 74 12/02/2020   LDLCALC 78 07/08/2018   Lab Results  Component Value Date   TSH 3.700 09/19/2020   TSH 0.932 07/14/2020    Therapeutic Level Labs: No results found for: LITHIUM No results found for: VALPROATE No components found for:  CBMZ  Current Medications: Current Outpatient Medications  Medication Sig Dispense Refill   atorvastatin (LIPITOR) 80 MG tablet Take 1 tablet (80 mg total) by mouth daily. PM 90 tablet 1   cetirizine (ZYRTEC) 10 MG tablet Take 10 mg by mouth daily.      Cholecalciferol (VITAMIN D3) 5000 units TABS Take 5,000 Units by mouth daily.     dexlansoprazole (DEXILANT) 60 MG capsule Take 60 mg by mouth daily.      famotidine (PEPCID) 40 MG tablet Take 40 mg by mouth daily.      famotidine (PEPCID) 40 MG tablet Take by mouth.     hydrOXYzine (VISTARIL) 25 MG capsule Take 1 capsule (25 mg total) by mouth 3 (three) times daily as needed for anxiety. 90 capsule 1   metoprolol succinate (TOPROL-XL) 25 MG 24 hr tablet TAKE 1 TABLET(25 MG) BY MOUTH DAILY 90 tablet 1   Multiple Vitamin (MULTIVITAMIN) tablet Take 1 tablet by mouth daily.     potassium chloride (KLOR-CON) 20 MEQ packet Take 20 mEq by mouth 2 (two) times daily.     Vilazodone HCl (VIIBRYD) 40 MG TABS Take 1 tablet (40 mg total) by mouth daily. DAILY 30 tablet 3   No current facility-administered medications for this visit.     Musculoskeletal: Strength & Muscle Tone:  UTA Gait & Station:  UTA Patient leans: N/A  Psychiatric Specialty Exam: Review of Systems  Psychiatric/Behavioral:  The patient is nervous/anxious.   All other systems reviewed and are negative.  Last menstrual period 09/30/1991.There is no height or weight on file to calculate BMI.  General Appearance:  UTA  Eye Contact:   UTA  Speech:  Clear and Coherent  Volume:  Normal  Mood:  Anxious  Affect:   UTA  Thought Process:  Goal Directed and Descriptions of Associations: Intact  Orientation:  Full (Time, Place, and Person)  Thought Content: Logical   Suicidal Thoughts:  No  Homicidal Thoughts:  No  Memory:  Immediate;   Fair Recent;   Fair Remote;   Fair  Judgement:  Fair  Insight:  Fair  Psychomotor Activity:   UTA  Concentration:  Concentration: Fair and Attention Span: Fair  Recall:  [employer] of Knowledge: Fair  Language: Fair  Akathisia:  No  Handed:  Right  AIMS (if indicated): not done  Assets:  Communication Skills Desire for [place]  Support  ADL's:  Intact  Cognition: WNL   Sleep:  Fair   Screenings: GAD-7    Flowsheet Row Video Visit from 11/16/2020 in [facility] Counselor from 11/02/2020 in [facility]  Total GAD-7 Score 17 11      PHQ2-9    Flowsheet Row Video Visit from 01/17/2021 in [facility] Video Visit from 12/16/2020 in [facility] Video Visit from 11/16/2020 in [facility] Counselor from 11/02/2020 in [facility] Video Visit from 10/06/2020 in [facility]  PHQ-2 Total Score _0 PHQ-9 Total Score _1 Flowsheet Row Counselor from 01/13/2021 in [facility] Counselor from 12/03/2020 in [facility] Video Visit from 11/16/2020 in [facility]  C-SSRS RISK CATEGORY No Risk No Risk No Risk        Assessment and Plan: FALLYN MUNNERLYN is a 65 year old Caucasian female, widowed, lives in [place], has a history of depression, anxiety, chronic pain, SVT was evaluated by telemedicine today.  Patient is currently stable with regards to her mood and reports sleep is improved.  Plan as noted below.  Plan MDD in remission Viibryd 40 mg p.o. daily Continue CBT with Ms. Christina Hussami  GAD-stable Viibryd as prescribed Hydroxyzine 25 mg p.o. twice daily as needed  Social anxiety disorder-stable Continue CBT  Insomnia-improving Discontinue temazepam for side effects and patient preference. Continue melatonin, magnesium combination which she is taking over-the-counter.   Bereavement-stable Continue CBT   I have spent at least 20 minutes non face to face with patient today.     Follow-up in clinic in 2 months in office.  This note was generated in part or whole with voice recognition software. Voice recognition is usually quite accurate but there  are transcription errors that can and very often do occur. I apologize for any typographical errors that were not detected and corrected.        Ursula Alert, MD 01/18/2021, 4:25 PM

## 2021-01-26 ENCOUNTER — Other Ambulatory Visit: Payer: Self-pay | Admitting: Pediatrics

## 2021-01-26 DIAGNOSIS — Z1231 Encounter for screening mammogram for malignant neoplasm of breast: Secondary | ICD-10-CM

## 2021-02-17 ENCOUNTER — Other Ambulatory Visit: Payer: Self-pay

## 2021-02-17 ENCOUNTER — Ambulatory Visit (HOSPITAL_BASED_OUTPATIENT_CLINIC_OR_DEPARTMENT_OTHER): Payer: Medicare HMO | Admitting: Licensed Clinical Social Worker

## 2021-02-17 DIAGNOSIS — F411 Generalized anxiety disorder: Secondary | ICD-10-CM

## 2021-02-17 DIAGNOSIS — F331 Major depressive disorder, recurrent, moderate: Secondary | ICD-10-CM | POA: Diagnosis not present

## 2021-02-17 NOTE — Progress Notes (Signed)
Virtual Visit via Audio Note  I connected with Wanda [name] on 02/17/21 at 10:00 AM EDT by an audio enabled telemedicine application and verified that I am speaking with the correct person using two identifiers.  Location: Patient: home Provider: remote office [place], [place])   I discussed the limitations of evaluation and management by telemedicine and the availability of in person appointments. The patient expressed understanding and agreed to proceed.   I discussed the assessment and treatment plan with the patient. The patient was provided an opportunity to ask questions and all were answered. The patient agreed with the plan and demonstrated an understanding of the instructions.   The patient was advised to call back or seek an in-person evaluation if the symptoms worsen or if the condition fails to improve as anticipated.  I provided 60 minutes of non-face-to-face time during this encounter.   Wanda [name] R Wanda Fera, LCSW   THERAPIST PROGRESS NOTE  Session Time: 10-11  Participation Level: Active  Behavioral Response: NAAlertDepressed  Type of Therapy: Individual Therapy  Treatment Goals addressed:   Goal: LTG-Patient's behavior demonstrates decreased anxiety Outcome: Progressing   Goal: LTG: Reduce frequency, intensity, and duration of depression symptoms as evidenced by: SSB input needed on appropriate metric Outcome: Progressing  Interventions:  Intervention: Work with @PREFFIRSTNAME @ to identify the major components of a recent episode of depression: physical symptoms, major thoughts and images, and major behaviors they experienced Intervention: Assist with grief counseling   Summary: Wanda [name] is a 65 y.o. female who presents with reporting stabilizing symptoms related to depression diagnosis. patient reports occasional insomnia, but overall sleep quality and quantity have improved. Patient reports that overall mood has improved, and that she is managing  situational stressors better. Allow patient to explore relationships associated with grandson and granddaughter. Patient discussed death of daughter, in recent overdose of grandson. Patient reports concern about grandsons addiction to substances.   patient reports that she is continuing to prioritize her health, and follows up with all doctors appointments and recommended testing.  Patient reports significant concern about sister, who is not tolerating chemotherapy will, and still has multiple radiation appointments. Allowed patient safe space to explore her thoughts and feelings associated with her sisters struggle.  Continued recommendations are as follows: self care behaviors, positive social engagements, focusing on overall work/home/life balance, and focusing on positive physical and emotional wellness.  .   Suicidal/Homicidal: No  Therapist Response: Pt is progressing towards set goals. Treatment to continue as indicated.   Plan: Return again in 4 weeks.  Diagnosis: Axis I: MDD, recurrent, moderate; GAD    Axis II: No diagnosis    Wanda [name] Wanda Mcphie, LCSW 02/17/2021

## 2021-02-18 NOTE — Plan of Care (Signed)
  Problem: Decrease depressive symptoms and improve levels of effective functioning Goal: LTG: Reduce frequency, intensity, and duration of depression symptoms as evidenced by: SSB input needed on appropriate metric Outcome: Progressing Goal: STG: @PREFFIRSTNAME @ will participate in at least 80% of scheduled individual psychotherapy sessions Outcome: Progressing Intervention: Work with @PREFFIRSTNAME @ to identify the major components of a recent episode of depression: physical symptoms, major thoughts and images, and major behaviors they experienced Intervention: Assist with grief counseling   Problem: Reduce overall frequency, intensity, and duration of the anxiety so that daily functioning is not impaired. Goal: STG: Patient will participate in at least 80% of scheduled individual psychotherapy sessions Outcome: Progressing Goal: LTG-Patient's behavior demonstrates decreased anxiety Outcome: Progressing

## 2021-02-21 ENCOUNTER — Other Ambulatory Visit: Payer: Self-pay

## 2021-02-21 ENCOUNTER — Ambulatory Visit
Admission: RE | Admit: 2021-02-21 | Discharge: 2021-02-21 | Disposition: A | Discharge status: Home / Self Care | Payer: Medicare HMO | Source: Ambulatory Visit | Attending: Pediatrics | Admitting: Pediatrics

## 2021-02-21 DIAGNOSIS — Z1231 Encounter for screening mammogram for malignant neoplasm of breast: Secondary | ICD-10-CM | POA: Insufficient documentation

## 2021-02-24 NOTE — Telephone Encounter (Signed)
CLOSED

## 2021-03-08 ENCOUNTER — Other Ambulatory Visit: Payer: Self-pay | Admitting: Psychiatry

## 2021-03-08 DIAGNOSIS — F411 Generalized anxiety disorder: Secondary | ICD-10-CM

## 2021-03-08 DIAGNOSIS — F3342 Major depressive disorder, recurrent, in full remission: Secondary | ICD-10-CM

## 2021-03-08 DIAGNOSIS — F331 Major depressive disorder, recurrent, moderate: Secondary | ICD-10-CM

## 2021-03-10 ENCOUNTER — Other Ambulatory Visit: Payer: Self-pay | Admitting: Psychiatry

## 2021-03-10 DIAGNOSIS — F331 Major depressive disorder, recurrent, moderate: Secondary | ICD-10-CM

## 2021-03-11 ENCOUNTER — Other Ambulatory Visit: Payer: Self-pay | Admitting: Psychiatry

## 2021-03-11 DIAGNOSIS — F331 Major depressive disorder, recurrent, moderate: Secondary | ICD-10-CM

## 2021-03-22 ENCOUNTER — Ambulatory Visit: Payer: Medicare HMO | Admitting: Psychiatry

## 2021-03-24 ENCOUNTER — Ambulatory Visit: Payer: Medicare HMO | Admitting: Licensed Clinical Social Worker

## 2021-03-30 ENCOUNTER — Ambulatory Visit (INDEPENDENT_AMBULATORY_CARE_PROVIDER_SITE_OTHER): Payer: Medicare Other | Admitting: Psychiatry

## 2021-03-30 ENCOUNTER — Other Ambulatory Visit: Payer: Self-pay

## 2021-03-30 ENCOUNTER — Encounter: Payer: Self-pay | Admitting: Psychiatry

## 2021-03-30 VITALS — BP 106/73 | HR 106 | Temp 98.1°F | Wt 144.8 lb

## 2021-03-30 DIAGNOSIS — F4010 Social phobia, unspecified: Secondary | ICD-10-CM | POA: Diagnosis not present

## 2021-03-30 DIAGNOSIS — F411 Generalized anxiety disorder: Secondary | ICD-10-CM | POA: Diagnosis not present

## 2021-03-30 DIAGNOSIS — F5101 Primary insomnia: Secondary | ICD-10-CM | POA: Diagnosis not present

## 2021-03-30 DIAGNOSIS — F3342 Major depressive disorder, recurrent, in full remission: Secondary | ICD-10-CM

## 2021-03-30 NOTE — Progress Notes (Signed)
[facility] MD OP Progress Note  03/30/2021 1:25 PM Wanda [name]  MRN:  [id number]  Chief Complaint:  Chief Complaint   Follow-up; Depression; Anxiety    HPI: Wanda [name] is a 65 year old Caucasian female, widowed, on SSI, lives in [place], has a history of GAD, MDD, social anxiety disorder, SVT, orthostatic hypotension, hyperlipidemia, was evaluated in office today.  Patient today reports she continues to be worried about her grandchildren.  Her 33 year old granddaughter and her 34 year old grandson, they continue to struggle with the loss of their mother, her daughter.  Patient reports she has been trying to spend time with them and guide them as best as she can.  She reports however she does have relationship struggles with her son-in-law, their father and that has been a stressor for her.  Patient reports however she has been sleeping better than before.  She currently uses an over-the-counter sleep medication which contains melatonin.  So far it is working out well for her.  She reports depression is currently stable on the Viibryd.  She denies suicidality, homicidality or perceptual disturbances.  Patient continues to be compliant with psychotherapy sessions.  Patient denies any other concerns today.  Visit Diagnosis:    ICD-10-CM   1. GAD (generalized anxiety disorder)  F41.1     2. MDD (major depressive disorder), recurrent, in full remission ([place])  F33.42     3. Social anxiety disorder  F40.10     4. Primary insomnia  F51.01       Past Psychiatric History: Reviewed past psychiatric history from progress note on 08/21/2017.  Past trials of Zoloft, Cymbalta, Effexor, Paxil, Prozac, Wellbutrin, prazosin, Seroquel, trazodone, doxepin, temazepam  Past Medical History:  Past Medical History:  Diagnosis Date   Anxiety    Basal cell carcinoma 05/03/2016   Left deltoid. Superficial.   Basal cell carcinoma 05/03/2016   Left distal lat. tricep. Nodular.   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 Dossie Arbour Serial number: [id number] Model number: [id number]     Past Surgical History:  Procedure Laterality Date   ABDOMINAL HYSTERECTOMY     APPENDECTOMY     BACK SURGERY     X 3   BREAST EXCISIONAL BIOPSY Right    surgical bx age 70    CHOLECYSTECTOMY  09/2020   COLONOSCOPY N/A 08/28/2013   Procedure: COLONOSCOPY;  Surgeon: Danie Binder, MD;  Location: [facility];  Service: Endoscopy;  Laterality: N/A;  8:30 AM   ENDOSCOPIC RETROGRADE CHOLANGIOPANCREATOGRAPHY (ERCP) WITH PROPOFOL N/A 09/20/2020   Procedure: ENDOSCOPIC RETROGRADE CHOLANGIOPANCREATOGRAPHY (ERCP) WITH PROPOFOL;  Surgeon: Lucilla Lame, MD;  Location: [facility] END[facility]COPY;  Service: Endoscopy;  Laterality: N/A;   INTRAMEDULLARY (IM) NAIL INTERTROCHANTERIC Left 05/30/2016   Procedure: INTRAMEDULLARY (IM)  NAIL INTERTROCHANTRIC;  Surgeon: Thornton Park, MD;  Location: [facility] ORS;  Service: Orthopedics;  Laterality: Left;   SHOULDER SURGERY Right    Spinal cord stimulator     SPINAL CORD STIMULATOR REMOVAL N/A 01/12/2020   Procedure: REMOVAL SPINAL CORD STIMULATOR AND PULSE GENERATOR;  Surgeon: Deetta Perla, MD;  Location: [facility] ORS;  Service: Neurosurgery;  Laterality: N/A;  Local w/ MAC    Family Psychiatric History: Reviewed family  psychiatric history from progress note on 08/21/2017  Family History:  Family History  Problem Relation Age of Onset   Diabetes Mother    COPD Mother    Heart disease Mother    Diabetes Father    COPD Father    Heart disease Father    Heart attack Father 46       CABG   Alcohol abuse Daughter    Depression Daughter    Drug abuse Daughter    Anxiety disorder Daughter    Tuberculosis Maternal Aunt    Cancer Paternal Aunt        breast   Breast cancer Paternal Aunt    Diabetes Paternal Grandmother    Diabetes Sister    Cancer Cousin    Diabetes Cousin    Colon cancer Neg Hx    Sudden Cardiac Death Neg Hx     Social History: Reviewed social history from progress note on 08/21/2017 Social History   Socioeconomic History   Marital status: Divorced    Spouse name: Not on file   Number of children: 2   Years of education: Not on file   Highest education level: High school graduate  Occupational History    Comment: disabled  Tobacco Use   Smoking status: Former    Packs/day: 0.25    Years: 14.00    Pack years: 3.50    Types: Cigarettes    Quit date: 1992    Years since quitting: 30.8   Smokeless tobacco: Never  Vaping Use   Vaping Use: Never used  Substance and Sexual Activity   Alcohol use: Yes    Comment: occasional; once every 6 months   Drug use: No   Sexual activity: Yes    Birth control/protection: Surgical  Other Topics Concern   Not on file  Social History Narrative   Not on file   Social Determinants of Health   Financial Resource Strain: Not on file  Food Insecurity: Not on file  Transportation Needs: Not on file  Physical Activity: Not on file  Stress: Not on file  Social Connections: Not on file    Allergies:  Allergies  Allergen Reactions   Levofloxacin Hives and Nausea And Vomiting   Tramadol Other (See Comments)    Severe headache   Pantoprazole Itching   Codeine Nausea Only    Slight nausea   Mobic [Meloxicam] Other (See Comments)     Calf tightness   Propoxyphene Hcl Rash    Metabolic Disorder Labs: Lab Results  Component Value Date   HGBA1C 5.4 05/29/2016   MPG 108 05/29/2016   No results found for: PROLACTIN Lab Results  Component Value Date   CHOL 164 12/02/2020   TRIG 262 (H) 12/02/2020   HDL 48 12/02/2020   CHOLHDL 3.4 12/02/2020   VLDL 59 (H) 09/29/2016   LDLCALC 74 12/02/2020   LDLCALC 78 07/08/2018   Lab Results  Component Value Date   TSH 3.700 09/19/2020   TSH 0.932 07/14/2020    Therapeutic Level Labs: No results found for: LITHIUM  No results found for: VALPROATE No components found for:  CBMZ  Current Medications: Current Outpatient Medications  Medication Sig Dispense Refill   alendronate (FOSAMAX) 70 MG tablet Take by mouth.     atorvastatin (LIPITOR) 80 MG tablet Take 1 tablet (80 mg total) by mouth daily. PM 90 tablet 1   cetirizine (ZYRTEC) 10 MG tablet Take 10 mg by mouth daily.     Cholecalciferol (VITAMIN D3) 5000 units TABS Take 5,000 Units by mouth daily.     dexlansoprazole (DEXILANT) 60 MG capsule Take 60 mg by mouth daily.      famotidine (PEPCID) 40 MG tablet Take by mouth.     hydrOXYzine (VISTARIL) 25 MG capsule Take 1 capsule (25 mg total) by mouth 3 (three) times daily as needed for anxiety. 90 capsule 1   metoprolol succinate (TOPROL-XL) 25 MG 24 hr tablet TAKE 1 TABLET(25 MG) BY MOUTH DAILY 90 tablet 1   Multiple Vitamin (MULTIVITAMIN) tablet Take 1 tablet by mouth daily.     potassium chloride (KLOR-CON) 20 MEQ packet Take 20 mEq by mouth 2 (two) times daily.     Vilazodone HCl (VIIBRYD) 40 MG TABS TAKE 1 TABLET(40 MG) BY MOUTH DAILY 30 tablet 3   No current facility-administered medications for this visit.     Musculoskeletal: Strength & Muscle Tone: within normal limits Gait & Station: normal Patient leans: N/A  Psychiatric Specialty Exam: Review of Systems  Psychiatric/Behavioral:  The patient is nervous/anxious.   All other systems reviewed and  are negative.  Blood pressure 106/73, pulse (!) 106, temperature 98.1 F (36.7 C), temperature source Temporal, weight 144 lb 12.8 oz (65.7 kg), last menstrual period 09/30/1991.Body mass index is 27.36 kg/m.  General Appearance: Casual  Eye Contact:  Fair  Speech:  Normal Rate  Volume:  Normal  Mood:  Anxious  Affect:  Appropriate  Thought Process:  Goal Directed and Descriptions of Associations: Intact  Orientation:  Full (Time, Place, and Person)  Thought Content: Logical   Suicidal Thoughts:  No  Homicidal Thoughts:  No  Memory:  Immediate;   Fair Recent;   Fair Remote;   Fair  Judgement:  Intact  Insight:  Good  Psychomotor Activity:  Normal  Concentration:  Concentration: Fair and Attention Span: Fair  Recall:  [employer] of Knowledge: Fair  Language: Fair  Akathisia:  No  Handed:  Right  AIMS (if indicated): done, 0  Assets:  Communication Skills Desire for Improvement Housing Social Support Transportation  ADL's:  Intact  Cognition: WNL  Sleep:  Fair   Screenings: GAD-7    Flowsheet Row Video Visit from 11/16/2020 in [facility] from 11/02/2020 in [facility]  Total GAD-7 Score 17 11      PHQ2-9    [place] Visit from 03/30/2021 in [facility] Video Visit from 01/17/2021 in [facility] Video Visit from 12/16/2020 in [facility] Video Visit from 11/16/2020 in [facility] from 11/02/2020 in [facility]  PHQ-2 Total Score _0 PHQ-9 Total Score _1 [facility] Visit from 03/30/2021 in [facility] from 02/17/2021 in [facility] from 01/13/2021 in [facility] No Risk No Risk No Risk         Assessment and Plan: DANINE HOR  is a 65 year old Caucasian female, widowed, lives in [place] , has a history of depression, anxiety, chronic pain, SVT was evaluated in office today.  Patient is currently stable.  Plan as noted below.  Plan MDD in remission Viibryd 40 mg p.o. daily Continue CBT with Ms. Christina Hussami  GAD-stable Viibryd as prescribed Hydroxyzine 25 mg p.o. twice daily as needed for anxiety attacks  Social anxiety disorder-stable Continue CBT  Insomnia-stable Patient takes over-the-counter melatonin, magnesium combination.  Follow-up in clinic in 3 months or sooner in office.  This note was generated in part or whole with voice recognition software. Voice recognition is usually quite accurate but there are transcription errors that can and very often do occur. I apologize for any typographical errors that were not detected and corrected.        Ursula Alert, MD 03/31/2021, 8:24 AM

## 2021-04-04 ENCOUNTER — Other Ambulatory Visit: Payer: Self-pay

## 2021-04-04 ENCOUNTER — Ambulatory Visit (INDEPENDENT_AMBULATORY_CARE_PROVIDER_SITE_OTHER): Payer: Medicare Other | Admitting: Licensed Clinical Social Worker

## 2021-04-04 DIAGNOSIS — Z634 Disappearance and death of family member: Secondary | ICD-10-CM

## 2021-04-04 DIAGNOSIS — F411 Generalized anxiety disorder: Secondary | ICD-10-CM | POA: Diagnosis not present

## 2021-04-04 DIAGNOSIS — F3342 Major depressive disorder, recurrent, in full remission: Secondary | ICD-10-CM
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
## 2021-04-04 NOTE — Plan of Care (Signed)
  Problem: Decrease depressive symptoms and improve levels of effective functioning Goal: LTG: Reduce frequency, intensity, and duration of depression symptoms as evidenced by: SSB input needed on appropriate metric Outcome: Progressing Note: Pt reporting continuing depression symptoms and some grief-triggered episodes Goal: STG: @PREFFIRSTNAME @ will participate in at least 80% of scheduled individual psychotherapy sessions Outcome: Progressing Intervention: Work with @PREFFIRSTNAME @ to identify the major components of a recent episode of depression: physical symptoms, major thoughts and images, and major behaviors they experienced Intervention: Review PLEASE Skills (Treat Physical Illness, Balance Eating, Avoid Mood-Altering Substances, Balance Sleep and Get Exercise) with @PREFFIRSTNAME @   Problem: Reduce overall frequency, intensity, and duration of the anxiety so that daily functioning is not impaired. Goal: STG: Patient will participate in at least 80% of scheduled individual psychotherapy sessions Outcome: Progressing Goal: LTG-Patient's behavior demonstrates decreased anxiety Outcome: Progressing Intervention: Work with patient to identify the major components of a recent episode of anxiety: physical symptoms, major thoughts and images, and major behaviors they experienced Intervention: Review PLEASE Skills (Treat Physical Illness, Balance Eating, Avoid Mood-Altering Substances, Balance Sleep and Get Exercise) with @PREFFIRSTNAME @ Intervention: Assist with relaxation techniques, as appropriate (deep breathing exercises, meditation, guided imagery)

## 2021-04-26 ENCOUNTER — Other Ambulatory Visit: Payer: Self-pay | Admitting: Internal Medicine

## 2021-05-12 ENCOUNTER — Other Ambulatory Visit: Payer: Self-pay

## 2021-05-12 ENCOUNTER — Ambulatory Visit (INDEPENDENT_AMBULATORY_CARE_PROVIDER_SITE_OTHER): Payer: Medicare Other | Admitting: Licensed Clinical Social Worker

## 2021-05-12 DIAGNOSIS — F3342 Major depressive disorder, recurrent, in full remission: Secondary | ICD-10-CM | POA: Diagnosis not present

## 2021-05-12 DIAGNOSIS — Z634 Disappearance and death of family member: Secondary | ICD-10-CM

## 2021-05-12 DIAGNOSIS — F411 Generalized anxiety disorder: Secondary | ICD-10-CM | POA: Diagnosis not present

## 2021-05-12 NOTE — Plan of Care (Signed)
°  Problem: Decrease depressive symptoms and improve levels of effective functioning Goal: LTG: Reduce frequency, intensity, and duration of depression symptoms as evidenced by: SSB input needed on appropriate metric Outcome: Not Progressing Note: Holiday season triggering depression symptoms Goal: STG: @PREFFIRSTNAME @ will participate in at least 80% of scheduled individual psychotherapy sessions Note: Pt on time and engaged throughout session Intervention: Encourage patient to identify triggers Intervention: Provide grief and bereavement support Intervention: Assess emotional status and coping mechanisms   Problem: Reduce overall frequency, intensity, and duration of the anxiety so that daily functioning is not impaired. Goal: STG: Patient will participate in at least 80% of scheduled individual psychotherapy sessions Outcome: Progressing Goal: LTG-Patient's behavior demonstrates decreased anxiety Outcome: Not Progressing Note: Holiday season triggering more anxiety
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
## 2021-05-16 ENCOUNTER — Other Ambulatory Visit: Payer: Self-pay

## 2021-05-16 ENCOUNTER — Ambulatory Visit: Payer: Medicare Other | Admitting: Dermatology

## 2021-05-16 DIAGNOSIS — L814 Other melanin hyperpigmentation: Secondary | ICD-10-CM

## 2021-05-16 DIAGNOSIS — Z85828 Personal history of other malignant neoplasm of skin: Secondary | ICD-10-CM | POA: Diagnosis not present

## 2021-05-16 DIAGNOSIS — L578 Other skin changes due to chronic exposure to nonionizing radiation: Secondary | ICD-10-CM

## 2021-05-16 DIAGNOSIS — L821 Other seborrheic keratosis: Secondary | ICD-10-CM

## 2021-05-16 DIAGNOSIS — L820 Inflamed seborrheic keratosis: Secondary | ICD-10-CM

## 2021-05-16 DIAGNOSIS — D229 Melanocytic nevi, unspecified: Secondary | ICD-10-CM

## 2021-05-16 DIAGNOSIS — Z1283 Encounter for screening for malignant neoplasm of skin: Secondary | ICD-10-CM

## 2021-05-16 DIAGNOSIS — D1800 Hemangioma unspecified site: Secondary | ICD-10-CM

## 2021-05-16 NOTE — Progress Notes (Signed)
Follow-Up Visit   Subjective  Wanda [name] is a 65 y.o. female who presents for the following: Follow-up (Patient here today for 1 year skin check.  Patient reports a few places at arms she would like checked. ). The patient presents for Total-Body Skin Exam (TBSE) for skin cancer screening and mole check.  The patient has spots, moles and lesions to be evaluated, some may be new or changing and the patient has concerns that these could be cancer.  The following portions of the chart were reviewed this encounter and updated as appropriate:  Tobacco   Allergies   Meds   Problems   Med Hx   Surg Hx   Fam Hx      Review of Systems: No other skin or systemic complaints except as noted in HPI or Assessment and Plan.  Objective  Well appearing patient in no apparent distress; mood and affect are within normal limits.  A focused examination was performed including upper extremities, including the arms, hands, fingers, and fingernails. Relevant physical exam findings are noted in the Assessment and Plan.  Right Upper Back x 1, right chest x 1, right forehead x 1, right upper arm x 1 (4) Erythematous stuck-on, waxy papule or plaque   Assessment & Plan  Inflamed seborrheic keratosis (4) Right Upper Back x 1, right chest x 1, right forehead x 1, right upper arm x 1  Destruction of lesion - Right Upper Back x 1, right chest x 1, right forehead x 1, right upper arm x 1 Complexity: simple   Destruction method: cryotherapy   Informed consent: discussed and consent obtained   Timeout:  patient name, date of birth, surgical site, and procedure verified Lesion destroyed using liquid nitrogen: Yes   Region frozen until ice ball extended beyond lesion: Yes   Outcome: patient tolerated procedure well with no complications   Post-procedure details: wound care instructions given   Additional details:  Prior to procedure, discussed risks of blister formation, small wound, skin dyspigmentation, or rare  scar following cryotherapy. Recommend Vaseline ointment to treated areas while healing.  Skin cancer screening  Lentigines - Scattered tan macules - Due to sun exposure - Benign-appearing, observe - Recommend daily broad spectrum sunscreen SPF 30+ to sun-exposed areas, reapply every 2 hours as needed. - Call for any changes  Seborrheic Keratoses - Stuck-on, waxy, tan-brown papules and/or plaques  - Benign-appearing - Discussed benign etiology and prognosis. - Observe - Call for any changes  Melanocytic Nevi - Tan-brown and/or pink-flesh-colored symmetric macules and papules - Benign appearing on exam today - Observation - Call clinic for new or changing moles - Recommend daily use of broad spectrum spf 30+ sunscreen to sun-exposed areas.   Hemangiomas - Red papules - Discussed benign nature - Observe - Call for any changes  Actinic Damage - Chronic condition, secondary to cumulative UV/sun exposure - diffuse scaly erythematous macules with underlying dyspigmentation - Recommend daily broad spectrum sunscreen SPF 30+ to sun-exposed areas, reapply every 2 hours as needed.  - Staying in the shade or wearing long sleeves, sun glasses (UVA+UVB protection) and wide brim hats (4-inch brim around the entire circumference of the hat) are also recommended for sun protection.  - Call for new or changing lesions.  History of Basal Cell Carcinoma of the Skin - No evidence of recurrence today see patient history  - Recommend regular full body skin exams - Recommend daily broad spectrum sunscreen SPF 30+ to sun-exposed areas, reapply every  2 hours as needed.  - Call if any new or changing lesions are noted between office visits  Skin cancer screening performed today.  Return for 1 year tbse. IRuthell Rummage, CMA, am acting as scribe for Sarina Ser, MD. Documentation: I have reviewed the above documentation for accuracy and completeness, and I agree with the above.  Sarina Ser, MD

## 2021-05-16 NOTE — Patient Instructions (Addendum)
Cryotherapy Aftercare  Wash gently with soap and water everyday.   Apply Vaseline and Band-Aid daily until healed.       Melanoma ABCDEs  Melanoma is the most dangerous type of skin cancer, and is the leading cause of death from skin disease.  You are more likely to develop melanoma if you: Have light-colored skin, light-colored eyes, or red or blond hair Spend a lot of time in the sun Tan regularly, either outdoors or in a tanning bed Have had blistering sunburns, especially during childhood Have a close family member who has had a melanoma Have atypical moles or large birthmarks  Early detection of melanoma is key since treatment is typically straightforward and cure rates are extremely high if we catch it early.   The first sign of melanoma is often a change in a mole or a new dark spot.  The ABCDE system is a way of remembering the signs of melanoma.  A for asymmetry:  The two halves do not match. B for border:  The edges of the growth are irregular. C for color:  A mixture of colors are present instead of an even brown color. D for diameter:  Melanomas are usually (but not always) greater than 52mm - the size of a pencil eraser. E for evolution:  The spot keeps changing in size, shape, and color.  Please check your skin once per month between visits. You can use a small mirror in front and a large mirror behind you to keep an eye on the back side or your body.   If you see any new or changing lesions before your next follow-up, please call to schedule a visit.  Please continue daily skin protection including broad spectrum sunscreen SPF 30+ to sun-exposed areas, reapplying every 2 hours as needed when you're outdoors.   Staying in the shade or wearing long sleeves, sun glasses (UVA+UVB protection) and wide brim hats (4-inch brim around the entire circumference of the hat) are also recommended for sun protection.    If You Need Anything After Your Visit  If you have any  questions or concerns for your doctor, please call our main line at [phone] and press option 4 to reach your doctor's medical assistant. If no one answers, please leave a voicemail as directed and we will return your call as soon as possible. Messages left after 4 pm will be answered the following business day.   You may also send [place] a message via [place]. We typically respond to MyChart messages within 1-2 business days.  For prescription refills, please ask your pharmacy to contact our office. Our fax number is [phone].  If you have an urgent issue when the clinic is closed that cannot wait until the next business day, you can page your doctor at the number below.    Please note that while we do our best to be available for urgent issues outside of office hours, we are not available 24/7.   If you have an urgent issue and are unable to reach [place], you may choose to seek medical care at your doctor's office, retail clinic, urgent care center, or emergency room.  If you have a medical emergency, please immediately call 911 or go to the emergency department.  Pager Numbers  - Dr. Nehemiah Massed: [phone]  - Dr. Laurence Ferrari: [phone]  - Dr. Nicole Kindred: [phone]  In the event of inclement weather, please call our main line at [phone] for an update on the status of any delays  or closures.  Dermatology Medication Tips: Please keep the boxes that topical medications come in in order to help keep track of the instructions about where and how to use these. Pharmacies typically print the medication instructions only on the boxes and not directly on the medication tubes.   If your medication is too expensive, please contact our office at [phone] option 4 or send [place] a message through [place].   We are unable to tell what your co-pay for medications will be in advance as this is different depending on your insurance coverage. However, we may be able to find a substitute medication at  lower cost or fill out paperwork to get insurance to cover a needed medication.   If a prior authorization is required to get your medication covered by your insurance company, please allow [place] 1-2 business days to complete this process.  Drug prices often vary depending on where the prescription is filled and some pharmacies may offer cheaper prices.  The website www.goodrx.com contains coupons for medications through different pharmacies. The prices here do not account for what the cost may be with help from insurance (it may be cheaper with your insurance), but the website can give you the price if you did not use any insurance.  - You can print the associated coupon and take it with your prescription to the pharmacy.  - You may also stop by our office during regular business hours and pick up a GoodRx coupon card.  - If you need your prescription sent electronically to a different pharmacy, notify our office through [facility] or by phone at [phone] option 4.     Si Usted Necesita Algo Despus de Su Visita  Tambin puede enviarnos un mensaje a travs de Pharmacist, community. Por lo general respondemos a los mensajes de MyChart en el transcurso de 1 a 2 das hbiles.  Para renovar recetas, por favor pida a su farmacia que se ponga en contacto con nuestra oficina. Harland Dingwall de fax es [place] [phone].  Si tiene un asunto urgente cuando la clnica est cerrada y que no puede esperar hasta el siguiente da hbil, puede llamar/localizar a su doctor(a) al nmero que aparece a continuacin.   Por favor, tenga en cuenta que aunque hacemos todo lo posible para estar disponibles para asuntos urgentes fuera del horario de [place], no estamos disponibles las 24 horas del da, los 7 das de la [place].   Si tiene un problema urgente y no puede comunicarse con nosotros, puede optar por buscar atencin mdica  en el consultorio de su doctor(a), en una clnica privada, en un centro de atencin urgente  o en una sala de emergencias.  Si tiene Engineering geologist, por favor llame inmediatamente al 911 o vaya a la sala de emergencias.  Nmeros de bper  - Dr. Nehemiah Massed: [phone]  - Dra. Moye: [phone]  - Dra. Nicole Kindred: [phone]  En caso de inclemencias del [place], por favor llame a Johnsie Kindred principal al [phone] para una actualizacin sobre el [place] de cualquier retraso o cierre.  Consejos para la medicacin en dermatologa: Por favor, guarde las cajas en las que vienen los medicamentos de uso tpico para ayudarle a seguir las instrucciones sobre dnde y cmo usarlos. Las farmacias generalmente imprimen las instrucciones del medicamento slo en las cajas y no directamente en los tubos del [place].   Si su medicamento es muy caro, por favor, pngase en contacto con Zigmund Daniel llamando al [phone] y presione la opcin 4  o envenos un mensaje a travs de MyChart.   No podemos decirle cul ser su copago por los medicamentos por adelantado ya que esto es diferente dependiendo de la cobertura de su seguro. Sin embargo, es posible que podamos encontrar un medicamento sustituto a Electrical engineer un formulario para que el seguro cubra el medicamento que se considera necesario.   Si se requiere una autorizacin previa para que su compaa de seguros [place] su medicamento, por favor permtanos de 1 a 2 das hbiles para completar este proceso.  Los precios de los medicamentos varan con frecuencia dependiendo del Environmental consultant de dnde se surte la receta y alguna farmacias pueden ofrecer precios ms baratos.  El sitio web www.goodrx.com tiene cupones para medicamentos de Airline pilot. Los precios aqu no tienen en cuenta lo que podra costar con la ayuda del seguro (puede ser ms barato con su seguro), pero el sitio web puede darle el precio si no utiliz Research scientist (physical sciences).  - Puede imprimir el cupn correspondiente y llevarlo con su receta a la farmacia.  - Tambin  puede pasar por nuestra oficina durante el horario de atencin regular y Charity fundraiser una tarjeta de cupones de GoodRx.  - Si necesita que su receta se enve electrnicamente a una farmacia diferente, informe a nuestra oficina a travs de MyChart de [place] o por telfono llamando al [phone] y presione la opcin 4.

## 2021-05-23 ENCOUNTER — Other Ambulatory Visit: Payer: Self-pay | Admitting: Medical

## 2021-05-28 ENCOUNTER — Encounter: Payer: Self-pay | Admitting: Dermatology

## 2021-06-02 NOTE — Progress Notes (Signed)
PROVIDER NOTE: Information contained herein reflects review and annotations entered in association with encounter. Interpretation of such information and data should be left to medically-trained personnel. Information provided to patient can be located elsewhere in the medical record under "Patient Instructions". Document created using STT-dictation technology, any transcriptional errors that may result from process are unintentional.    Patient: Wanda [name]  Service Category: E/M  Provider: Gaspar Cola, MD  DOB: April 24, 1956  DOS: 06/06/2021  Specialty: Interventional Pain Management  MRN: [id number]  Setting: Ambulatory outpatient  PCP: Barbaraann Boys, MD  Type: Established Patient    Referring Provider: Barbaraann Boys, MD  Location: Office  Delivery: Face-to-face     HPI  Wanda [name], a 66 y.o. year old female, is here today because of her Chronic left shoulder pain [M25.512, G89.29]. [name] primary complain today is Shoulder Pain (left) Last encounter: My last encounter with her was on Visit date not found. Pertinent problems: Wanda [name] has Generalized osteoarthrosis of hand; Chronic low back pain (1ry area of Pain) (Bilateral) (R>L); Lumbar spondylosis; Chronic lower extremity pain (Left); Lumbar facet arthropathy; Lumbar facet syndrome (Bilateral) (R>L); Chronic lower extremity pain (2ry area of Pain) (Right); Chronic hip pain (Right); Osteoarthritis of hip (Right); Chronic neck pain; Closed hip fracture, sequela (Left); Chronic pain syndrome; Closed intertrochanteric fracture of hip, sequela (Left); Compression fracture of L1 lumbar vertebra (old); Lumbar spondylosis w/o Radiculopathy; Chronic sacroiliac joint pain (Left); Chronic musculoskeletal pain; Trigger point with back pain; History of hip fracture; Chronic shoulder pain (Left); and DDD (degenerative disc disease), cervical on their pertinent problem list. Pain Assessment: Severity of Chronic pain is reported as a 7  /10. Location: Shoulder Left/upper arm. Onset: More than a month ago. Quality: Sharp, Aching, Constant. Timing: Constant. Modifying factor(s): NSAIDS, heat, ice. Vitals:  height is $RemoveB'5\' 1"'KCeFMPmB$  (1.549 m) and weight is 148 lb (67.1 kg). Her temporal temperature is 97.1 F (36.2 C) (abnormal). Her blood pressure is 137/96 (abnormal) and her pulse is 98. Her respiration is 18 and oxygen saturation is 100%.   Reason for encounter: new problems.  The patient was last seen on 12/08/2019.  The patient comes into the clinics today with a new complaint of left shoulder pain which started approximately 3 months ago when she had her COVID-vaccine injected into the area of that shoulder.  No prior history of surgeries, x-rays, joint injections, or physical therapy for that shoulder.  Pharmacotherapy Assessment  Analgesic: None MME/day: 0 mg/day   Monitoring: [place] PMP: PDMP reviewed during this encounter.       Pharmacotherapy: No side-effects or adverse reactions reported. Compliance: No problems identified. Effectiveness: Clinically acceptable.  Hart Rochester, RN  06/06/2021  8:04 AM  Signed Safety precautions to be maintained throughout the outpatient stay will include: orient to surroundings, keep bed in low position, maintain call bell within reach at all times, provide assistance with transfer out of bed and ambulation.     UDS:  No results found for: SUMMARY   ROS  Constitutional: Denies any fever or chills Gastrointestinal: No reported hemesis, hematochezia, vomiting, or acute GI distress Musculoskeletal: Denies any acute onset joint swelling, redness, loss of ROM, or weakness Neurological: No reported episodes of acute onset apraxia, aphasia, dysarthria, agnosia, amnesia, paralysis, loss of coordination, or loss of consciousness  Medication Review  Vilazodone HCl, Vitamin D3, alendronate, atorvastatin, dexlansoprazole, famotidine, hydrOXYzine, methylPREDNISolone, metoprolol succinate, multivitamin,  and potassium chloride  History Review  Allergy: Wanda [name] is allergic to  levofloxacin, tramadol, pantoprazole, codeine, mobic [meloxicam], and propoxyphene hcl. Drug: Wanda [name]  reports no history of drug use. Alcohol:  reports current alcohol use. Tobacco:  reports that she quit smoking about 31 years ago. Her smoking use included cigarettes. She has a 3.50 pack-year smoking history. She has never used smokeless tobacco. Social: Wanda [name]  reports that she quit smoking about 31 years ago. Her smoking use included cigarettes. She has a 3.50 pack-year smoking history. She has never used smokeless tobacco. She reports current alcohol use. She reports that she does not use drugs. 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 Surgical: Wanda [name]  has a past surgical history that includes Shoulder surgery (Right); Appendectomy; Abdominal hysterectomy; Back surgery; Spinal cord stimulator; Colonoscopy (N/A, 08/28/2013); Intramedullary (im) nail intertrochanteric (Left, 05/30/2016); Spinal cord stimulator removal (N/A, 01/12/2020); Breast excisional biopsy (Right); Endoscopic retrograde cholangiopancreatography (ercp) with propofol (N/A, 09/20/2020); and Cholecystectomy (09/2020). Family: family history includes Alcohol abuse in her daughter; Anxiety disorder in her daughter; Breast cancer in her  paternal aunt; COPD in her father and mother; Cancer in her cousin and paternal aunt; Depression in her daughter; Diabetes in her cousin, father, mother, paternal grandmother, and sister; Drug abuse in her daughter; Heart attack (age of onset: 54) in her father; Heart disease in her father and mother; Tuberculosis in her maternal aunt.  Laboratory Chemistry Profile   Renal Lab Results  Component Value Date   BUN 5 (L) 09/21/2020   CREATININE 0.58 09/21/2020   BCR 14 07/14/2020   GFRAA 110 07/14/2020   GFRNONAA >60 09/21/2020    Hepatic Lab Results  Component Value Date   AST 31 09/21/2020   ALT 86 (H) 09/21/2020   ALBUMIN 3.1 (L) 09/21/2020   ALKPHOS 130 (H) 09/21/2020   LIPASE 27 09/18/2020    Electrolytes Lab Results  Component Value Date   NA 139 09/21/2020   K 3.9 09/21/2020   CL 108 09/21/2020   CALCIUM 9.0 09/21/2020   MG 1.8 09/20/2020    Bone Lab Results  Component Value Date   25OHVITD1 46 07/16/2017   25OHVITD2 <1.0 07/16/2017   25OHVITD3 46 07/16/2017    Inflammation (CRP: Acute Phase) (ESR: Chronic Phase) Lab Results  Component Value Date   CRP 1 07/14/2020   ESRSEDRATE 3 07/14/2020         Note: Above Lab results reviewed.  Recent Imaging Review  MM 3D SCREEN BREAST BILATERAL CLINICAL DATA:  Screening.  EXAM: DIGITAL SCREENING BILATERAL MAMMOGRAM WITH TOMOSYNTHESIS AND CAD  TECHNIQUE: Bilateral screening digital craniocaudal and mediolateral oblique mammograms were obtained. Bilateral screening digital breast tomosynthesis was performed. The images were evaluated with computer-aided detection.  COMPARISON:  Previous exam(s).  ACR Breast Density Category b: There are scattered areas of fibroglandular density.  FINDINGS: There are no findings suspicious for malignancy.  IMPRESSION: No mammographic evidence of malignancy. A result letter of this screening mammogram will be mailed directly to the patient.  RECOMMENDATION: Screening  mammogram in one year. (Code:[id number]-B-01Y)  BI-RADS CATEGORY  1: Negative.  Electronically Signed   By: Roxy Horseman.D.  On: 03/01/2021 09:08 Note: Reviewed        Physical Exam  General appearance: Well nourished, well developed, and well hydrated. In no apparent acute distress Mental status: Alert, oriented x 3 (person, place, & time)       Respiratory: No evidence of acute respiratory distress Eyes: PERLA Vitals: BP (!) 137/96    Pulse 98    Temp (!) 97.1 F (36.2 C) (Temporal)    Resp 18    Ht 5\' 1"  (1.549 m)    Wt 148 lb (67.1 kg)    LMP 09/30/1991 (Approximate) Comment: 1993   SpO2 100%    BMI 27.96 kg/m  BMI: Estimated body mass index is 27.96 kg/m as calculated from the following:   Height as of this encounter: 5\' 1"  (1.549 m).   Weight as of this encounter: 148 lb (67.1 kg). Ideal: Ideal body weight: 47.8 kg (105 lb 6.1 oz) Adjusted ideal body weight: 55.5 kg (122 lb 6.8 oz)  Assessment   Status Diagnosis  Controlled Controlled Controlled 1. Chronic shoulder pain (Left)   2. Chronic musculoskeletal pain   3. Chronic neck pain   4. DDD (degenerative disc disease), cervical      Updated Problems: Problem  Chronic shoulder pain (Left)   Patient indicates that it started after COVID-vaccine in that shoulder.   Ddd (Degenerative Disc Disease), Cervical  Closed intertrochanteric fracture of hip, sequela (Left)  History of Hip Fracture   Left closed intertrochanteric fracture   Closed hip fracture, sequela (Left)  Chronic low back pain (1ry area of Pain) (Bilateral) (R>L)  Chronic lower extremity pain (2ry area of Pain) (Right)   Pain seems to be referred.   Hepatic Cyst  Insomnia  Neuroleptic-Induced Acute Dystonia  Gad (Generalized Anxiety Disorder)  Hx of Nonmelanoma Skin Cancer  Prediabetes  Hyperglycemia  Mdd (Major Depressive Disorder), Recurrent, in Partial Remission (Hcc)  Hx Laparoscopic Cholecystectomy  Bile Salt-Induced Diarrhea  Acute Lower  Uti  Choledocholithiasis  Bereavement  Mdd (Major Depressive Disorder), Recurrent, in Full Remission (Hcc)  Ptsd (Post-Traumatic Stress Disorder)   Sees Dr. 11/30/1991 with psychiatry   Mdd (Major Depressive Disorder), Recurrent Episode, Moderate (Hcc)  Social Anxiety Disorder  Skin Cancer of Face  Essential Hypertension  Palpitations  Overweight  Tachycardia  Other Specified Anxiety Disorders    Plan of Care  Problem-specific:  No problem-specific Assessment & Plan notes found for this encounter.  Ms. EMIRETH COCKERHAM has a current medication list which includes the following long-term medication(s): atorvastatin, dexlansoprazole, famotidine, metoprolol succinate, potassium chloride, and vilazodone hcl.  Pharmacotherapy (Medications Ordered): Meds ordered this encounter  Medications   methylPREDNISolone (MEDROL) 4 MG TBPK tablet    Sig: Follow package instructions.    Dispense:  21 tablet    Refill:  0    Do not add to the "Automatic Refill" notification system.   Orders:  Orders Placed This Encounter  Procedures   DG Shoulder Left    Standing Status:   Future    Standing Expiration Date:   07/07/2021    Scheduling Instructions:     Imaging must be done as soon as possible. Inform patient that order will expire within 30 days and I will not renew it.    Order Specific Question:   Reason for Exam (SYMPTOM  OR DIAGNOSIS REQUIRED)    Answer:   Left shoulder pain    Order Specific Question:   Preferred imaging location?    Answer:   [facility]  Order Specific Question:   Call Results- Best Contact Number?    Answer:   [phone] ([facility])    Order Specific Question:   Release to patient    Answer:   Immediate   Ambulatory referral to Physical Therapy    Referral Priority:   Routine    Referral Type:   Physical Medicine    Referral Reason:   Specialty Services Required    Requested Specialty:   Physical Therapy    Number of Visits Requested:   1    Follow-up plan:   Return in about 2 weeks (around 06/20/2021) for Eval-day (M,W), (VV), for review of ordered tests (left shoulder x-ray), steroid taper and PT.Marland Kitchen      Interventional Therapies  Risk   Complexity Considerations:   Estimated body mass index is 27.96 kg/m as calculated from the following:   Height as of this encounter: $RemoveBeforeD'5\' 1"'IotCbFkUoZnnAU$  (1.549 m).   Weight as of this encounter: 148 lb (67.1 kg). WNL   Planned   Pending:   Pending further evaluation   Under consideration:   Possible bilateral lumbar facet RFA, starting with the left side. Diagnostic (Midline) L1-2 LESI     Completed:   Implant of Lumbar spinal cord stimulator (12/13/2012) (Dr. Dossie Arbour) (referral by Dr. Neomia Dear)  Removal of nonfunctional spinal cord stimulator (01/12/2020) (Dr. Lacinda Axon)  Diagnostic bilateral lumbar facet MBB x3 (06/28/2017) (100/100/70/75) (100/100/85/<50) (50/50/80/50)  Diagnostic left lumbar facet MBB x1 (02/20/2017) (100/100/100/100)  Diagnostic/therapeutic right erector spinae & quadratus lumborum muscle MNB x1 (07/24/2017) (100/100)   Therapeutic   Palliative (PRN) options:   Therapeutic/palliative lumbar facet MBB     Recent Visits No visits were found meeting these conditions. Showing recent visits within past 90 days and meeting all other requirements Today's Visits Date Type Provider Dept  06/06/21 Office Visit Milinda Pointer, MD [facility]  Showing today's visits and meeting all other requirements Future Appointments Date Type Provider Dept  06/20/21 Appointment Milinda Pointer, MD [facility]  Showing future appointments within next 90 days and meeting all other requirements  I discussed the assessment and treatment plan with the patient. The patient was provided an opportunity to ask questions and all were answered. The patient agreed with the plan and demonstrated an understanding of the instructions.  Patient advised to call back or seek an  in-person evaluation if the symptoms or condition worsens.  Duration of encounter: 30 minutes.  Note by: Gaspar Cola, MD Date: 06/06/2021; Time: 8:28 AM

## 2021-06-06 ENCOUNTER — Other Ambulatory Visit: Payer: Self-pay

## 2021-06-06 ENCOUNTER — Ambulatory Visit
Admission: RE | Admit: 2021-06-06 | Discharge: 2021-06-06 | Disposition: A | Discharge status: Home / Self Care | Payer: Medicare Other | Attending: Pain Medicine | Admitting: Pain Medicine

## 2021-06-06 ENCOUNTER — Ambulatory Visit: Payer: Medicare Other | Admitting: Pain Medicine

## 2021-06-06 ENCOUNTER — Encounter: Payer: Self-pay | Admitting: Pain Medicine

## 2021-06-06 ENCOUNTER — Ambulatory Visit
Admission: RE | Admit: 2021-06-06 | Discharge: 2021-06-06 | Disposition: A | Discharge status: Home / Self Care | Payer: Medicare Other | Source: Ambulatory Visit | Attending: Pain Medicine | Admitting: Pain Medicine

## 2021-06-06 VITALS — BP 137/96 | HR 98 | Temp 97.1°F | Resp 18 | Ht 61.0 in | Wt 148.0 lb

## 2021-06-06 DIAGNOSIS — G8929 Other chronic pain: Secondary | ICD-10-CM

## 2021-06-06 DIAGNOSIS — M25512 Pain in left shoulder: Secondary | ICD-10-CM | POA: Diagnosis not present

## 2021-06-06 DIAGNOSIS — M5030 Other cervical disc degeneration, unspecified cervical region: Secondary | ICD-10-CM

## 2021-06-06 DIAGNOSIS — M542 Cervicalgia: Secondary | ICD-10-CM | POA: Insufficient documentation

## 2021-06-06 DIAGNOSIS — M7918 Myalgia, other site: Secondary | ICD-10-CM | POA: Insufficient documentation

## 2021-06-06 MED ORDER — METHYLPREDNISOLONE 4 MG PO TBPK: ORAL_TABLET | ORAL | 0 refills | Status: AC

## 2021-06-06 NOTE — Progress Notes (Signed)
Safety precautions to be maintained throughout the outpatient stay will include: orient to surroundings, keep bed in low position, maintain call bell within reach at all times, provide assistance with transfer out of bed and ambulation.  

## 2021-06-19 NOTE — Progress Notes (Signed)
Patient: Wanda [name]  Service Category: E/M  Provider: Gaspar Cola, MD  DOB: 11/29/1955  DOS: 06/20/2021  Location: Office  MRN: [id number]  Setting: Ambulatory outpatient  Referring Provider: Barbaraann Boys, MD  Type: Established Patient  Specialty: Interventional Pain Management  PCP: Barbaraann Boys, MD  Location: Remote location  Delivery: TeleHealth     Virtual Encounter - Pain Management PROVIDER NOTE: Information contained herein reflects review and annotations entered in association with encounter. Interpretation of such information and data should be left to medically-trained personnel. Information provided to patient can be located elsewhere in the medical record under "Patient Instructions". Document created using STT-dictation technology, any transcriptional errors that may result from process are unintentional.    Contact & Pharmacy Preferred: [phone] Home: [phone] (home) Mobile: [phone] (mobile) E-mail: [email or web address]  [facility] [address] [place] [address], [place] [address] [place] [address] Phone: [phone] Fax: [phone]  [facility] [place], [place] - [address] [facility] [address] [place] [address] Phone: [phone] Fax: [phone]  [facility] [place], [place] [facility] [address] [facility] [place] [address] Phone: [phone] Fax: [phone]   Pre-screening  Wanda [name] offered "in-person" vs "virtual" encounter. She indicated preferring virtual for this encounter.   Reason COVID-19*   Social distancing based on CDC and AMA recommendations.   I contacted Wanda [name] on 06/20/2021 via telephone.      I clearly identified myself as Gaspar Cola, MD. I verified that I was speaking with the correct person using two identifiers (Name: Wanda [name], and date of birth:  [birth year]).  Consent I sought verbal advanced consent from Wanda [name] for virtual visit interactions. I informed Wanda [name] of possible security and privacy concerns, risks, and limitations associated with providing "not-in-person" medical evaluation and management services. I also informed Wanda [name] of the availability of "in-person" appointments. Finally, I informed her that there would be a charge for the virtual visit and that she could be  personally, fully or partially, financially responsible for it. [name] expressed understanding and agreed to proceed.   Historic Elements   Wanda [name] is a 66 y.o. year old, female patient evaluated today after our last contact on 06/06/2021. 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 She also  has a past surgical history that includes Shoulder surgery (Right); Appendectomy; Abdominal hysterectomy; Back surgery; Spinal cord stimulator; Colonoscopy (N/A, 08/28/2013); Intramedullary (im) nail intertrochanteric (Left, 05/30/2016); Spinal cord stimulator removal (N/A, 01/12/2020); Breast excisional biopsy (Right); Endoscopic retrograde cholangiopancreatography (ercp) with propofol (N/A, 09/20/2020); and Cholecystectomy (09/2020). Wanda [name] has a current medication list which includes the following prescription(s): alendronate,  atorvastatin, vitamin d3, dexlansoprazole, famotidine, hydroxyzine, metoprolol succinate, multivitamin, and vilazodone hcl. She  reports that she quit smoking about 31 years ago. Her smoking use included cigarettes. She has a 3.50 pack-year smoking history. She has never used smokeless tobacco. She reports current alcohol use. She  reports that she does not use drugs. Wanda [name] is allergic to levofloxacin, tramadol, pantoprazole, codeine, mobic [meloxicam], and propoxyphene hcl.   HPI  Today, she is being contacted for review of her left shoulder x-rays, physical therapy, and steroid taper for her left shoulder pain.  According to her left shoulder x-ray she has mild to moderate acromioclavicular and glenohumeral osteoarthritis.  The x-rays also show 3-4 calcifications within the proximal humerus likely to be within the biceps tendon sheath.  This may represent some calcific tendinitis of the biceps tendon.  The patient indicates that she is a little skeptical about the physical therapy since she had it done on the opposite shoulder and it actually makes things worse.  Of course of that shoulder she had to undergo surgery.  At this point, she refers that the Medrol Dosepak provided her with an ongoing 95% relief of her shoulder pain.  Today I have reviewed the results of her x-ray and I have provided this information to the patient in layman's terms.  Since she is currently doing okay, we will simply just wait and see.  She was instructed to give [place] a call if the pain gets any worse.  She understood and accepted.  Pharmacotherapy Assessment   Opioid Analgesic: None MME/day: 0 mg/day   Monitoring: [place] PMP: PDMP reviewed during this encounter.       Pharmacotherapy: No side-effects or adverse reactions reported. Compliance: No problems identified. Effectiveness: Clinically acceptable. Plan: Refer to "POC". UDS: No results found for: SUMMARY   Laboratory Chemistry Profile   Renal Lab Results   Component Value Date   BUN 5 (L) 09/21/2020   CREATININE 0.58 09/21/2020   BCR 14 07/14/2020   GFRAA 110 07/14/2020   GFRNONAA >60 09/21/2020    Hepatic Lab Results  Component Value Date   AST 31 09/21/2020   ALT 86 (H) 09/21/2020   ALBUMIN 3.1 (L) 09/21/2020   ALKPHOS 130 (H) 09/21/2020   LIPASE 27 09/18/2020    Electrolytes Lab Results  Component Value Date   NA 139 09/21/2020   K 3.9 09/21/2020   CL 108 09/21/2020   CALCIUM 9.0 09/21/2020   MG 1.8 09/20/2020    Bone Lab Results  Component Value Date   25OHVITD1 46 07/16/2017   25OHVITD2 <1.0 07/16/2017   25OHVITD3 46 07/16/2017    Inflammation (CRP: Acute Phase) (ESR: Chronic Phase) Lab Results  Component Value Date   CRP 1 07/14/2020   ESRSEDRATE 3 07/14/2020         Note: Above Lab results reviewed.  Imaging  DG Shoulder Left CLINICAL DATA:  Chronic left shoulder pain.  EXAM: LEFT SHOULDER - 2+ VIEW  COMPARISON:  None.  FINDINGS: There is diffuse decreased bone mineralization. Mild to moderate inferior glenohumeral joint space narrowing with mild peripheral degenerative spurring. Moderate acromioclavicular joint space narrowing with mild peripheral degenerative osteophytosis. Mild distal lateral subacromial spurring. There are 3 or 4 ossicles measuring up to 6 mm individually overlying the proximal humerus, likely within the biceps tendon sheath and representing loose bodies. The visualized portion left lung is unremarkable.  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  Multiple loose bodies within the biceps tendon sheath.  Electronically Signed   By: Yvonne Kendall   On: 06/06/2021 09:24  Assessment  The primary encounter diagnosis was Chronic shoulder pain (Left). Diagnoses of Arthropathy of shoulder (Left), Biceps tendinitis of shoulder (Left), Osteoarthritis of glenohumeral joint (Left), and Osteoarthritis of AC (acromioclavicular) joint (Left) were  also  pertinent to this visit.  Plan of Care  Problem-specific:  No problem-specific Assessment & Plan notes found for this encounter.  Wanda [name] has a current medication list which includes the following long-term medication(s): atorvastatin, dexlansoprazole, famotidine, metoprolol succinate, and vilazodone hcl.  Pharmacotherapy (Medications Ordered): No orders of the defined types were placed in this encounter.  Orders:  No orders of the defined types were placed in this encounter.  Follow-up plan:   No follow-ups on file.      Interventional Therapies  Risk   Complexity Considerations:   Estimated body mass index is 27.96 kg/m as calculated from the following:   Height as of this encounter: $RemoveBeforeD'5\' 1"'wSJtKyBXaKfQpH$  (1.549 m).   Weight as of this encounter: 148 lb (67.1 kg). WNL   Planned   Pending:      Under consideration:   Possible bilateral lumbar facet RFA, starting with the left side. Diagnostic (Midline) L1-2 LESI     Completed:   Medrol Dosepak for left shoulder pain (06/06/2021) (95% ongoing relief of the pain) Implant of Lumbar spinal cord stimulator (12/13/2012) (Dr. Dossie Arbour) (referral by Dr. Neomia Dear)  Removal of nonfunctional spinal cord stimulator (01/12/2020) (Dr. Lacinda Axon)  Diagnostic bilateral lumbar facet MBB x3 (06/28/2017) (100/100/70/75) (100/100/85/<50) (50/50/80/50)  Diagnostic left lumbar facet MBB x1 (02/20/2017) (100/100/100/100)  Diagnostic/therapeutic right erector spinae & quadratus lumborum muscle MNB x1 (07/24/2017) (100/100)   Therapeutic   Palliative (PRN) options:   Therapeutic/palliative lumbar facet MBB     Recent Visits Date Type Provider Dept  06/06/21 Office Visit Milinda Pointer, MD [facility]  Showing recent visits within past 90 days and meeting all other requirements Today's Visits Date Type Provider Dept  06/20/21 Office Visit Milinda Pointer, MD [facility]  Showing today's visits and meeting all  other requirements Future Appointments No visits were found meeting these conditions. Showing future appointments within next 90 days and meeting all other requirements  I discussed the assessment and treatment plan with the patient. The patient was provided an opportunity to ask questions and all were answered. The patient agreed with the plan and demonstrated an understanding of the instructions.  Patient advised to call back or seek an in-person evaluation if the symptoms or condition worsens.  Duration of encounter: 18 minutes.  Note by: Gaspar Cola, MD Date: 06/20/2021; Time: 3:22 PM

## 2021-06-20 ENCOUNTER — Ambulatory Visit: Payer: Medicare Other | Attending: Pain Medicine | Admitting: Pain Medicine

## 2021-06-20 ENCOUNTER — Other Ambulatory Visit: Payer: Self-pay

## 2021-06-20 DIAGNOSIS — M25512 Pain in left shoulder: Secondary | ICD-10-CM

## 2021-06-20 DIAGNOSIS — M7522 Bicipital tendinitis, left shoulder: Secondary | ICD-10-CM | POA: Insufficient documentation

## 2021-06-20 DIAGNOSIS — G8929 Other chronic pain: Secondary | ICD-10-CM | POA: Diagnosis not present

## 2021-06-20 DIAGNOSIS — M19012 Primary osteoarthritis, left shoulder: Secondary | ICD-10-CM | POA: Insufficient documentation

## 2021-06-24 ENCOUNTER — Other Ambulatory Visit: Payer: Self-pay

## 2021-06-24 ENCOUNTER — Ambulatory Visit (INDEPENDENT_AMBULATORY_CARE_PROVIDER_SITE_OTHER): Payer: Medicare Other | Admitting: Licensed Clinical Social Worker

## 2021-06-24 DIAGNOSIS — Z634 Disappearance and death of family member: Secondary | ICD-10-CM | POA: Diagnosis not present

## 2021-06-24 DIAGNOSIS — F411 Generalized anxiety disorder: Secondary | ICD-10-CM

## 2021-06-24 DIAGNOSIS — F3342 Major depressive disorder, recurrent, in full remission: Secondary | ICD-10-CM | POA: Diagnosis not present

## 2021-06-24 NOTE — Plan of Care (Signed)
°  Problem: Depression CCP Problem  1 Decrease depressive symptoms and improve levels of effective functioning-pt reports a decrease in overall depression symptoms 3 out of 5 sessions documented.  Goal: LTG: Reduce frequency, intensity, and duration of depression symptoms as evidenced by: SSB input needed on appropriate metric Outcome: Progressing Goal: STG: Maresha WILL PARTICIPATE IN AT LEAST 80% OF SCHEDULED INDIVIDUAL PSYCHOTHERAPY SESSIONS Outcome: Progressing   Problem: Anxiety Disorder CCP Problem  1 Reduce overall frequency, intensity, and duration of the anxiety so that daily functioning is not impaired per pt self report 3 out of 5 sessions documented.   Goal: LTG: Patient will score less than 5 on the Generalized Anxiety Disorder 7 Scale (GAD-7) Outcome: Progressing Goal: STG: Patient will participate in at least 80% of scheduled individual psychotherapy sessions Outcome: Progressing

## 2021-06-24 NOTE — Progress Notes (Signed)
Virtual Visit via Audio Note  I connected with Wanda [name] on 06/24/21 at 11:00 AM EST by an audio enabled telemedicine application and verified that I am speaking with the correct person using two identifiers.  Location: Patient: home Provider: [facility]   I discussed the limitations of evaluation and management by telemedicine and the availability of in person appointments. The patient expressed understanding and agreed to proceed.   I discussed the assessment and treatment plan with the patient. The patient was provided an opportunity to ask questions and all were answered. The patient agreed with the plan and demonstrated an understanding of the instructions.   The patient was advised to call back or seek an in-person evaluation if the symptoms worsen or if the condition fails to improve as anticipated.  I provided 60 minutes of non-face-to-face time during this encounter.   Wanda Garciagarcia R Decorian Schuenemann, LCSW   THERAPIST PROGRESS NOTE  Session Time: 4-5p  Participation Level: Active  Behavioral Response: NAAlertAnxious and Depressed  Type of Therapy: Individual Therapy  Treatment Goals addressed:  Problem: Depression CCP Problem  1 Decrease depressive symptoms and improve levels of effective functioning-pt reports a decrease in overall depression symptoms 3 out of 5 sessions documented.   Goal: LTG: Reduce frequency, intensity, and duration of depression symptoms as evidenced by: SSB input needed on appropriate metric Outcome: Progressing Goal: STG: [name] WILL PARTICIPATE IN AT LEAST 80% OF SCHEDULED INDIVIDUAL PSYCHOTHERAPY SESSIONS Outcome: Progressing   Problem: Anxiety Disorder CCP Problem  1 Reduce overall frequency, intensity, and duration of the anxiety so that daily functioning is not impaired per pt self report 3 out of 5 sessions documented.    Goal: LTG: Patient will score less than 5 on the Generalized Anxiety Disorder 7 Scale (GAD-7) Outcome: Progressing  Goal: STG:  Patient will participate in at least 80% of scheduled individual psychotherapy sessions Outcome: Progressing   Interventions:  Intervention: Encourage patient to identify triggers  Intervention: Provide grief and bereavement support  Intervention: Assess emotional status and coping mechanisms   Summary: Wanda [name] is a 66 y.o. female who presents with symptoms consistent with depression, anxiety, and bereavement. Pt reports that mood is fluctuating depending on situation. .   Allowed pt to explore and express thoughts and feelings associated with recent life situations and external stressors. Allowed pt to explore her grief/reflect on the death of her daughter and overall psychological impact. Pt reports that she still has a lot of questions surrounding the death of her daughter which is a barrier to pt finding acceptance/closure. Allowed pt safe space to explore all feelings freely. Discussions triggered pt to explore the death of a husband in the past just a couple of months after they were married--pt states that he died from a sudden heart attack. Pt discussed the grieving process with her husband at that time and how it has come and gone through the years.   Discussed relationship w/ SIL and how they were out of town during the holidays, so pt was unable to see [name].    Reviewed coping skills to manage depression symptoms and anxiety/stress symptoms.  Continued recommendations are as follows: self care behaviors, positive social engagements, focusing on overall work/home/life balance, and focusing on positive physical and emotional wellness.   .   Suicidal/Homicidal: No  Therapist Response: Pt is experiencing some Pt is continuing to apply interventions learned in session into daily life situations. Pt is currently on track to meet goals utilizing interventions mentioned above. Personal  growth and progress noted. Treatment to continue as indicated.   Plan: Return again in 4  weeks.  Diagnosis: Axis I: MDD, recurrent, GAD, Bereavement    Axis II: No diagnosis    Wanda [name] Wanda Marrocco, LCSW 06/24/2021

## 2021-06-24 NOTE — Plan of Care (Signed)
°  Problem: Depression CCP Problem  1 Decrease depressive symptoms and improve levels of effective functioning-pt reports a decrease in overall depression symptoms 3 out of 5 sessions documented.  Goal: LTG: Reduce frequency, intensity, and duration of depression symptoms as evidenced by: SSB input needed on appropriate metric Outcome: Progressing Goal: STG: Wanda [name] WILL PARTICIPATE IN AT LEAST 80% OF SCHEDULED INDIVIDUAL PSYCHOTHERAPY SESSIONS Outcome: Progressing   Problem: Anxiety Disorder CCP Problem  1 Reduce overall frequency, intensity, and duration of the anxiety so that daily functioning is not impaired per pt self report 3 out of 5 sessions documented.   Goal: LTG: Patient will score less than 5 on the Generalized Anxiety Disorder 7 Scale (GAD-7) Outcome: Progressing Goal: STG: Patient will participate in at least 80% of scheduled individual psychotherapy sessions Outcome: Progressing

## 2021-06-29 ENCOUNTER — Encounter: Payer: Self-pay | Admitting: Psychiatry

## 2021-06-29 ENCOUNTER — Ambulatory Visit (INDEPENDENT_AMBULATORY_CARE_PROVIDER_SITE_OTHER): Payer: Medicare Other | Admitting: Psychiatry

## 2021-06-29 ENCOUNTER — Other Ambulatory Visit: Payer: Self-pay

## 2021-06-29 VITALS — BP 123/85 | HR 108 | Temp 97.9°F | Wt 151.8 lb

## 2021-06-29 DIAGNOSIS — F5101 Primary insomnia: Secondary | ICD-10-CM

## 2021-06-29 DIAGNOSIS — F4010 Social phobia, unspecified: Secondary | ICD-10-CM | POA: Diagnosis not present

## 2021-06-29 DIAGNOSIS — F411 Generalized anxiety disorder: Secondary | ICD-10-CM | POA: Diagnosis not present

## 2021-06-29 DIAGNOSIS — F3342 Major depressive disorder, recurrent, in full remission: Secondary | ICD-10-CM | POA: Diagnosis not present

## 2021-06-29 MED ORDER — VILAZODONE HCL 40 MG PO TABS: ORAL_TABLET | ORAL | 1 refills | Status: DC

## 2021-06-29 NOTE — Progress Notes (Signed)
[place] MD OP Progress Note  06/29/2021 1:48 PM SHEYLI HORWITZ  MRN:  [id number]  Chief Complaint:  Chief Complaint   Follow-up 66 year old Caucasian female with history of MDD, GAD, social anxiety disorder, presents for medication management.    HPI: Wanda [name] is a 66 year old Caucasian female, widowed, on SSI, lives in [place], has a history of GAD, MDD, social anxiety disorder, SVT, orthostatic hypotension, hyperlipidemia was evaluated in office today.  Patient today reports she continues to grieve the loss of her daughter who passed away due to accidental drug overdose.  Patient continues to follow-up with her therapist and reports therapy sessions as beneficial.  Patient reports she is currently doing overall okay with her current medication regimen.  She is on Viibryd, tolerates it well.  Patient reports sleep as restless on and off however she has been sleeping better on this over-the-counter medication called sleep 3.  Patient denies any suicidality, homicidality or perceptual disturbances.  Patient denies any other concerns today.  Visit Diagnosis:    ICD-10-CM   1. GAD (generalized anxiety disorder)  F41.1     2. MDD (major depressive disorder), recurrent, in full remission ([place])  F33.42 Vilazodone HCl (VIIBRYD) 40 MG TABS    3. Social anxiety disorder  F40.10     4. Primary insomnia  F51.01       Past Psychiatric History: Reviewed past psychiatric history from progress note on 08/21/2017.  Past trials of Zoloft, Cymbalta, Effexor, Paxil, Prozac, Wellbutrin, prazosin, Seroquel, trazodone, doxepin, temazepam.  Past Medical History:  Past Medical History:  Diagnosis Date   Anxiety    Basal cell carcinoma 05/03/2016   Left deltoid. Superficial.   Basal cell carcinoma 05/03/2016   Left distal lat. tricep. Nodular.   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 Dossie Arbour Serial number: [id number] Model number: [id number]     Past Surgical History:  Procedure Laterality Date   ABDOMINAL HYSTERECTOMY     APPENDECTOMY     BACK SURGERY     X 3   BREAST EXCISIONAL BIOPSY Right    surgical bx age 89    CHOLECYSTECTOMY  09/2020   COLONOSCOPY N/A 08/28/2013   Procedure: COLONOSCOPY;  Surgeon: Danie Binder, MD;  [facility];  Service: Endoscopy;  Laterality: N/A;  8:30 AM   ENDOSCOPIC RETROGRADE CHOLANGIOPANCREATOGRAPHY (ERCP) WITH PROPOFOL N/A 09/20/2020   Procedure: ENDOSCOPIC RETROGRADE CHOLANGIOPANCREATOGRAPHY (ERCP) WITH PROPOFOL;  Surgeon: Lucilla Lame, MD;  Location: [facility] ENDOSCOPY;  Service: Endoscopy;  Laterality: N/A;   INTRAMEDULLARY (IM) NAIL INTERTROCHANTERIC Left 05/30/2016   Procedure: INTRAMEDULLARY (IM) NAIL INTERTROCHANTRIC;  Surgeon: Thornton Park, MD;  Location: [facility] ORS;  Service: Orthopedics;  Laterality: Left;   SHOULDER SURGERY Right    Spinal cord  stimulator     SPINAL CORD STIMULATOR REMOVAL N/A 01/12/2020   Procedure: REMOVAL SPINAL CORD STIMULATOR AND PULSE GENERATOR;  Surgeon: Deetta Perla, MD;  Location: [facility] ORS;  Service: Neurosurgery;  Laterality: N/A;  Local w/ MAC    Family Psychiatric History: Reviewed family psychiatric history from progress note on 08/21/2017.  Family History:  Family History  Problem Relation Age of Onset    Diabetes Mother    COPD Mother    Heart disease Mother    Diabetes Father    COPD Father    Heart disease Father    Heart attack Father 57       CABG   Alcohol abuse Daughter    Depression Daughter    Drug abuse Daughter    Anxiety disorder Daughter    Tuberculosis Maternal Aunt    Cancer Paternal Aunt        breast   Breast cancer Paternal Aunt    Diabetes Paternal Grandmother    Diabetes Sister    Cancer Cousin    Diabetes Cousin    Colon cancer Neg Hx    Sudden Cardiac Death Neg Hx     Social History: Reviewed social history from progress note on 08/21/2017. Social History   Socioeconomic History   Marital status: Divorced    Spouse name: Not on file   Number of children: 2   Years of education: Not on file   Highest education level: High school graduate  Occupational History    Comment: disabled  Tobacco Use   Smoking status: Former    Packs/day: 0.25    Years: 14.00    Pack years: 3.50    Types: Cigarettes    Quit date: 1992    Years since quitting: 31.1   Smokeless tobacco: Never  Vaping Use   Vaping Use: Never used  Substance and Sexual Activity   Alcohol use: Yes    Comment: occasional; once every 6 months   Drug use: No   Sexual activity: Yes    Birth control/protection: Surgical  Other Topics Concern   Not on file  Social History Narrative   Not on file   Social Determinants of Health   Financial Resource Strain: Not on file  Food Insecurity: Not on file  Transportation Needs: Not on file  Physical Activity: Not on file  Stress: Not on file  Social Connections: Not on file    Allergies:  Allergies  Allergen Reactions   Levofloxacin Hives and Nausea And Vomiting   Tramadol Other (See Comments)    Severe headache   Pantoprazole Itching   Codeine Nausea Only    Slight nausea   Mobic [Meloxicam] Other (See Comments)    Calf tightness   Propoxyphene Hcl Rash    Metabolic Disorder Labs: Lab Results  Component Value Date   HGBA1C  5.4 05/29/2016   MPG 108 05/29/2016   No results found for: PROLACTIN Lab Results  Component Value Date   CHOL 164 12/02/2020   TRIG 262 (H) 12/02/2020   HDL 48 12/02/2020   CHOLHDL 3.4 12/02/2020   VLDL 59 (H) 09/29/2016   LDLCALC 74 12/02/2020   LDLCALC 78 07/08/2018   Lab Results  Component Value Date   TSH 3.700 09/19/2020   TSH 0.932 07/14/2020    Therapeutic Level Labs: No results found for: LITHIUM No results found for: VALPROATE No components found for:  CBMZ  Current Medications: Current Outpatient Medications  Medication Sig Dispense Refill   alendronate (FOSAMAX) 70  MG tablet Take by mouth.     atorvastatin (LIPITOR) 80 MG tablet TAKE 1 TABLET(80 MG) BY MOUTH DAILY IN THE EVENING 90 tablet 1   Cholecalciferol (VITAMIN D3) 5000 units TABS Take 5,000 Units by mouth daily.     dexlansoprazole (DEXILANT) 60 MG capsule Take 60 mg by mouth daily.      famotidine (PEPCID) 40 MG tablet Take by mouth.     hydrOXYzine (VISTARIL) 25 MG capsule Take 1 capsule (25 mg total) by mouth 3 (three) times daily as needed for anxiety. 90 capsule 1   metoprolol succinate (TOPROL-XL) 25 MG 24 hr tablet TAKE 1 TABLET(25 MG) BY MOUTH DAILY 90 tablet 1   Multiple Vitamin (MULTIVITAMIN) tablet Take 1 tablet by mouth daily.     Vilazodone HCl (VIIBRYD) 40 MG TABS TAKE 1 TABLET(40 MG) BY MOUTH DAILY 90 tablet 1   No current facility-administered medications for this visit.     Musculoskeletal: Strength & Muscle Tone: within normal limits Gait & Station: normal Patient leans: N/A  Psychiatric Specialty Exam: Review of Systems  Psychiatric/Behavioral:  Positive for sleep disturbance.        Grieving  All other systems reviewed and are negative.  Blood pressure 140/85, pulse (!) 106, temperature 97.9 F (36.6 C), temperature source Temporal, weight 151 lb 12.8 oz (68.9 kg), last menstrual period 09/30/1991.Body mass index is 28.68 kg/m.  General Appearance: Casual  Eye Contact:   Good  Speech:  Clear and Coherent  Volume:  Normal  Mood:   grieving  Affect:  Congruent  Thought Process:  Goal Directed and Descriptions of Associations: Intact  Orientation:  Full (Time, Place, and Person)  Thought Content: Logical   Suicidal Thoughts:  No  Homicidal Thoughts:  No  Memory:  Immediate;   Fair Recent;   Fair Remote;   Fair  Judgement:  Fair  Insight:  Fair  Psychomotor Activity:  Normal  Concentration:  Concentration: Fair and Attention Span: Fair  Recall:  [employer] of Knowledge: Fair  Language: Fair  Akathisia:  No  Handed:  Right  AIMS (if indicated): done, 0  Assets:  Communication Skills Desire for Improvement Housing Social Support  ADL's:  Intact  Cognition: WNL  Sleep:   restless but improving   Screenings: GAD-7    Flowsheet Row Video Visit from 11/16/2020 in [facility] from 11/02/2020 in [facility]  Total GAD-7 Score 17 11      PHQ2-9    [place] Visit from 06/29/2021 in [facility] Visit from 06/06/2021 in [facility] Office Visit from 03/30/2021 in [facility] Video Visit from 01/17/2021 in [facility] Video Visit from 12/16/2020 in [facility]  PHQ-2 Total Score 2 0 _0 PHQ-9 Total Score 10 -- _1 [place] Office Visit from 06/29/2021 in [place] Counselor from 05/12/2021 in [facility] Counselor from 04/04/2021 in [facility] No Risk No Risk No Risk        Assessment and Plan: ANNABELL OCONNOR is a 66 year old Caucasian female, widowed, lives in [place], has a history of depression, anxiety, chronic pain, SVT was evaluated in office today.  Patient is currently stable  although continues to grieve the loss of her daughter.  Plan as noted below.  Plan MDD in  remission Viibryd 40 mg p.o. daily Continue CBT with Ms. Christina Hussami  GAD-stable Viibryd as prescribed Hydroxyzine 25 mg p.o. twice daily as needed for anxiety attacks  Social anxiety disorder-stable Continue CBT  Insomnia-stable Currently takes over-the-counter medication that contains melatonin, magnesium combination.  Patient to continue to follow up with her therapist for her grief reaction.  Follow-up in clinic in 3 months or sooner if needed.  This note was generated in part or whole with voice recognition software. Voice recognition is usually quite accurate but there are transcription errors that can and very often do occur. I apologize for any typographical errors that were not detected and corrected.       Ursula Alert, MD 06/29/2021, 1:48 PM

## 2021-06-29 NOTE — Progress Notes (Deleted)
Cardiology Office Note:    Date:  06/29/2021   ID:  Wanda [name], Wanda [name] 1955/11/06, MRN [id number]  PCP:  Barbaraann Boys, MD  [facility] Cardiologist:  Nelva Bush, MD  [facility] Electrophysiologist:  None   Referring MD: Barbaraann Boys, MD   Chief Complaint: 6 month follow-up  History of Present Illness:    Wanda [name] is a 66 y.o. female with a hx of with a hx of orthostatic hypotension, sinus tachycardia, HLD, anxiety, depression who presents for 1 month follow-up.    Zio monitor, ordered for exertional dyspnea and palpitations, showed ST with rare ectopy, 4 runs of breif pSVT.    Seen back 09/02/20 and reported vague chest pain and racing heart beats   Echo 10/28/20 showed EF 60-65% G1DD   Seen 10/29/20 and reported persistent tachypalpitations and anxiety. Recommended decrease caffeine intake and good fluid intake. She was started on propranolol, however did not tolerate this very well, and went back to metoprolol.   Last seen 12/02/20 and reported fluctuating Bps, but was not taking BP at the same time.   Past Medical History:  Diagnosis Date   Anxiety    Basal cell carcinoma 05/03/2016   Left deltoid. Superficial.   Basal cell carcinoma 05/03/2016   Left distal lat. tricep. Nodular.   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 Dossie Arbour Serial number: [id number] Model number: [id number]     Past Surgical History:  Procedure Laterality Date   ABDOMINAL HYSTERECTOMY     APPENDECTOMY     BACK SURGERY     X 3   BREAST EXCISIONAL BIOPSY Right    surgical bx age 15    CHOLECYSTECTOMY  09/2020   COLONOSCOPY N/A 08/28/2013   Procedure: COLONOSCOPY;  Surgeon: Danie Binder, MD;  [facility];  Service: Endoscopy;  Laterality: N/A;  8:30 AM   ENDOSCOPIC RETROGRADE CHOLANGIOPANCREATOGRAPHY (ERCP) WITH PROPOFOL N/A 09/20/2020   Procedure: ENDOSCOPIC RETROGRADE CHOLANGIOPANCREATOGRAPHY (ERCP) WITH PROPOFOL;  Surgeon: Lucilla Lame, MD;  Location: [facility] ENDOSCOPY;  Service: Endoscopy;  Laterality: N/A;   INTRAMEDULLARY (IM) NAIL INTERTROCHANTERIC Left 05/30/2016   Procedure: INTRAMEDULLARY (IM) NAIL INTERTROCHANTRIC;  Surgeon: Thornton Park, MD;  Location: [facility] ORS;  Service: Orthopedics;  Laterality: Left;   SHOULDER SURGERY Right    Spinal cord stimulator     SPINAL CORD STIMULATOR REMOVAL N/A 01/12/2020   Procedure: REMOVAL SPINAL CORD STIMULATOR AND PULSE GENERATOR;  Surgeon: Deetta Perla, MD;  Location: [facility] ORS;  Service: Neurosurgery;  Laterality: N/A;  Local w/ MAC    Current Medications: No outpatient medications have been marked as taking for the 06/30/21 encounter (Appointment) with Kathlen Mody, Johnnisha Forton H, PA-C.     Allergies:   Levofloxacin, Tramadol, Pantoprazole, Codeine, Mobic [  meloxicam], and Propoxyphene hcl   Social History   Socioeconomic History   Marital status: Divorced    Spouse name: Not on file   Number of children: 2   Years of education: Not on file   Highest education level: High school graduate  Occupational History    Comment: disabled  Tobacco Use   Smoking status: Former    Packs/day: 0.25    Years: 14.00    Pack years: 3.50    Types: Cigarettes    Quit date: 1992    Years since quitting: 31.1   Smokeless tobacco: Never  Vaping Use    Vaping Use: Never used  Substance and Sexual Activity   Alcohol use: Yes    Comment: occasional; once every 6 months   Drug use: No   Sexual activity: Yes    Birth control/protection: Surgical  Other Topics Concern   Not on file  Social History Narrative   Not on file   Social Determinants of Health   Financial Resource Strain: Not on file  Food Insecurity: Not on file  Transportation Needs: Not on file  Physical Activity: Not on file  Stress: Not on file  Social Connections: Not on file     Family History: The patient's ***family history includes Alcohol abuse in her daughter; Anxiety disorder in her daughter; Breast cancer in her paternal aunt; COPD in her father and mother; Cancer in her cousin and paternal aunt; Depression in her daughter; Diabetes in her cousin, father, mother, paternal grandmother, and sister; Drug abuse in her daughter; Heart attack (age of onset: 24) in her father; Heart disease in her father and mother; Tuberculosis in her maternal aunt. There is no history of Colon cancer or Sudden Cardiac Death.  ROS:   Please see the history of present illness.    *** All other systems reviewed and are negative.  EKGs/Labs/Other Studies Reviewed:    The following studies were reviewed today: ***  EKG:  EKG is *** ordered today.  The ekg ordered today demonstrates ***  Recent Labs: 09/19/2020: TSH 3.700 09/20/2020: Magnesium 1.8 09/21/2020: ALT 86; BUN 5; Creatinine, Ser 0.58; Hemoglobin 12.1; Platelets 238; Potassium 3.9; Sodium 139  Recent Lipid Panel    Component Value Date/Time   CHOL 164 12/02/2020 0917   TRIG 262 (H) 12/02/2020 0917   HDL 48 12/02/2020 0917   CHOLHDL 3.4 12/02/2020 0917   CHOLHDL 5.0 09/29/2016 0731   VLDL 59 (H) 09/29/2016 0731   LDLCALC 74 12/02/2020 0917   LDLDIRECT 197.3 02/01/2010 0907     Risk Assessment/Calculations:   {Does this patient have ATRIAL FIBRILLATION?[phone]}   Physical Exam:    VS:  LMP 09/30/1991  (Approximate) Comment: 1993    Wt Readings from Last 3 Encounters:  06/06/21 148 lb (67.1 kg)  12/02/20 143 lb (64.9 kg)  10/29/20 144 lb (65.3 kg)     GEN: *** Well nourished, well developed in no acute distress HEENT: Normal NECK: No JVD; No carotid bruits LYMPHATICS: No lymphadenopathy CARDIAC: ***RRR, no murmurs, rubs, gallops RESPIRATORY:  Clear to auscultation without rales, wheezing or rhonchi  ABDOMEN: Soft, non-tender, non-distended MUSCULOSKELETAL:  No edema; No deformity  SKIN: Warm and dry NEUROLOGIC:  Alert and oriented x 3 PSYCHIATRIC:  Normal affect   ASSESSMENT:    No diagnosis found. PLAN:    In order of problems listed above:  ***  Disposition: Follow up {follow up:15908} with ***   Shared Decision Making/Informed Consent   {Are you ordering a  CV Procedure (e.g. stress test, cath, DCCV, TEE, etc)?   Press F2        :[id number]}    Signed, Jamaya Sleeth Ninfa Meeker, PA-C  06/29/2021 3:35 PM    [facility]

## 2021-06-30 ENCOUNTER — Encounter: Payer: Self-pay | Admitting: Medical

## 2021-06-30 ENCOUNTER — Ambulatory Visit: Payer: Medicare Other | Admitting: Medical

## 2021-06-30 VITALS — BP 120/84 | HR 107 | Ht 61.0 in | Wt 151.4 lb

## 2021-06-30 DIAGNOSIS — E782 Mixed hyperlipidemia: Secondary | ICD-10-CM

## 2021-06-30 DIAGNOSIS — I471 Supraventricular tachycardia: Secondary | ICD-10-CM | POA: Diagnosis not present

## 2021-06-30 DIAGNOSIS — I6523 Occlusion and stenosis of bilateral carotid arteries: Secondary | ICD-10-CM

## 2021-06-30 DIAGNOSIS — I951 Orthostatic hypotension: Secondary | ICD-10-CM

## 2021-06-30 DIAGNOSIS — R000 Tachycardia, unspecified: Secondary | ICD-10-CM

## 2021-06-30 MED ORDER — METOPROLOL SUCCINATE ER 50 MG PO TB24: 50.0000 mg | ORAL_TABLET | Freq: Every day | ORAL | 3 refills | Status: DC

## 2021-06-30 NOTE — Progress Notes (Signed)
Cardiology Office Note:    Date:  06/30/2021   ID:  Wanda, [name] 1956-05-27, MRN [id number]  PCP:  Barbaraann Boys, MD  [facility] Cardiologist:  Nelva Bush, MD  [facility] Electrophysiologist:  None   Referring MD: Barbaraann Boys, MD   Chief Complaint: 6 month follow-up  History of Present Illness:    Wanda [name] is a 66 y.o. female with a hx of  with a hx of orthostatic hypotension, sinus tachycardia, HLD, anxiety, depression who presents for 1 month follow-up.    Zio monitor, ordered for exertional dyspnea and palpitations, showed ST with rare ectopy, 4 runs of breif pSVT.    Seen back 09/02/20 and reported vague chest pain and racing heart beats   Echo 10/28/20 showed EF 60-65% G1DD   Seen 10/29/20 and reported persistent tachypalpitations and anxiety. Recommended decrease caffeine intake and good fluid intake. She was started on propranolol, however did not tolerate this very well, and went back to metoprolol.   Last seen 12/02/20 and the patient reports fluctuating Bps, but was taking it multiple times a day. No changes were made  Today, the patient reports she has been doing well. No changes in medical history. No chest pain, SOB, LLE, orthopnea, pnd. Has not felt palpitations. Had brief orthostatic episode, just stood and waited until she felt ok before walking. BP good today. HR is 107bpm,     Past Medical History:  Diagnosis Date   Anxiety    Basal cell carcinoma 05/03/2016   Left deltoid. Superficial.   Basal cell carcinoma 05/03/2016   Left distal lat. tricep. Nodular.   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 Dossie Arbour Serial number: [id number] Model number: [id number]     Past Surgical History:  Procedure Laterality Date   ABDOMINAL HYSTERECTOMY     APPENDECTOMY     BACK SURGERY     X 3   BREAST EXCISIONAL BIOPSY Right    surgical bx age 40    CHOLECYSTECTOMY  09/2020   COLONOSCOPY N/A 08/28/2013   Procedure: COLONOSCOPY;  Surgeon: Danie Binder, MD;  Location: [facility];  Service: Endoscopy;  Laterality: N/A;  8:30 AM   ENDOSCOPIC RETROGRADE CHOLANGIOPANCREATOGRAPHY (ERCP) WITH PROPOFOL N/A 09/20/2020   Procedure: ENDOSCOPIC RETROGRADE CHOLANGIOPANCREATOGRAPHY (ERCP) WITH PROPOFOL;  Surgeon: Lucilla Lame, MD;  Location: [facility] ENDOSCOPY;  Service: Endoscopy;  Laterality: N/A;   INTRAMEDULLARY (IM) NAIL INTERTROCHANTERIC Left 05/30/2016   Procedure: INTRAMEDULLARY (IM) NAIL INTERTROCHANTRIC;  Surgeon: Thornton Park, MD;  Location: [facility] ORS;  Service: Orthopedics;  Laterality: Left;   SHOULDER SURGERY Right    Spinal cord stimulator     SPINAL CORD STIMULATOR REMOVAL N/A 01/12/2020   Procedure: REMOVAL SPINAL CORD STIMULATOR AND PULSE GENERATOR;  Surgeon: Deetta Perla, MD;  Location: [facility] ORS;  Service: Neurosurgery;  Laterality: N/A;  Local w/ MAC    Current Medications: Current Meds  Medication Sig   alendronate (FOSAMAX) 70 MG tablet Take by mouth.   atorvastatin (LIPITOR) 80 MG tablet TAKE 1 TABLET(80 MG) BY MOUTH DAILY IN THE EVENING   Cholecalciferol (VITAMIN D3) 5000 units TABS Take 5,000 Units by mouth daily.   dexlansoprazole (DEXILANT) 60 MG capsule Take 60 mg by mouth daily.    famotidine (PEPCID) 40 MG tablet Take by mouth.   hydrOXYzine  (VISTARIL) 25 MG capsule Take 1 capsule (25 mg total) by mouth 3 (three) times daily as needed for anxiety.   metoprolol succinate (TOPROL XL) 50 MG 24 hr tablet Take 1 tablet (50 mg total) by mouth daily. Take with or immediately following a meal.   Multiple Vitamin (MULTIVITAMIN) tablet Take 1 tablet by mouth daily.   Vilazodone HCl (VIIBRYD) 40 MG TABS TAKE 1 TABLET(40 MG) BY MOUTH DAILY   [DISCONTINUED] metoprolol succinate (TOPROL-XL) 25 MG 24 hr tablet TAKE 1 TABLET(25 MG) BY MOUTH DAILY     Allergies:   Levofloxacin, Tramadol, Pantoprazole, Codeine, Mobic [meloxicam], and Propoxyphene hcl   Social History   Socioeconomic History   Marital status: Divorced    Spouse name: Not on file   Number of children: 2   Years of education: Not on file   Highest education level: High school graduate  Occupational History    Comment: disabled  Tobacco Use   Smoking status: Former    Packs/day: 0.25    Years: 14.00    Pack years: 3.50    Types: Cigarettes    Quit date: 1992    Years since quitting: 31.1   Smokeless tobacco: Never  Vaping Use   Vaping Use: Never used  Substance and Sexual Activity   Alcohol use: Yes    Comment: occasional; once every 6 months   Drug use: No   Sexual activity: Yes    Birth control/protection: Surgical  Other Topics Concern   Not on file  Social History Narrative   Not on file   Social Determinants of Health   Financial Resource Strain: Not on file  Food Insecurity: Not on file  Transportation Needs: Not on file  Physical Activity: Not on file  Stress: Not on file  Social Connections: Not on file     Family History: The patient's family history includes Alcohol abuse in her daughter; Anxiety disorder in her daughter; Breast cancer in her paternal aunt; COPD in her father and mother; Cancer in her cousin and paternal aunt; Depression in her daughter; Diabetes in her cousin, father, mother, paternal grandmother, and sister; Drug abuse in her  daughter; Heart attack (age of onset: 24) in her father; Heart disease in her father and mother; Tuberculosis in her maternal aunt. There is no history of Colon cancer or Sudden Cardiac Death.  ROS:   Please see the history of present illness.     All other systems reviewed and are negative.  EKGs/Labs/Other Studies Reviewed:    The following studies were reviewed today:  Echo 10/28/20  1. Left ventricular ejection fraction, by estimation, is 60 to 65%. The  left ventricle has normal function. The left ventricle has no regional  wall motion abnormalities. Left ventricular diastolic parameters are  consistent with Grade I diastolic  dysfunction (impaired relaxation).   2. Right ventricular systolic function is normal. The right ventricular  size is normal. Tricuspid regurgitation  signal is inadequate for assessing  PA pressure.   08/18/20 Cardiac monitor The patient was monitored for 14 days. The predominant rhythm was sinus with an average rate of 93 bpm (range 55-180 bpm). There were rare PAC's and occasional PVC's (1.1% PVC burden). Four atrial runs lasting up to 23.3 seconds occurred with a maximum rate of 174 bpm. No sustained arrhythmia or prolonged pause was identified. Patient triggered event corresponds to sinus rhythm with isolated PVC. Predominantly sinus rhythm with rare PAC's and occasional PVC's.  PSVT also noted (see details above).   Echo 05/2016 - Procedure narrative: Transthoracic echocardiography. The study    was technically difficult.  - Left ventricle: Systolic function was normal. The estimated    ejection fraction was in the range of 60% to 65%.  - Aortic valve: Valve area (Vmax): 2.13 cm^2.   Long term monitor 05/2016 The patient was monitored for 13 days, 20 hours. The predominant rhythm was sinus with an average rate of 84 bpm (range 52-170 bpm). Rare PACs and atrial couplets were identified. Rare isolated PVCs were also seen. There were 5 episodes of  supraventricular tachycardia lasting up to 10.8 seconds with a maximal rate of 152 bpm. There were no sustained arrhythmias or prolonged pauses. There were no patient triggered episodes. Predominantly sinus rhythm with rare PACs and PVCs, as well as paroxysmal SVT.   Carotids 2018 IMPRESSION: Mild bilateral carotid atherosclerotic vascular disease. No flow limiting stenosis. Degree of stenosis less than 50% bilaterally. 2. Vertebral arteries are patent with antegrade flow.    EKG:  EKG is  ordered today.  The ekg ordered today demonstrates ST, 107bpm, nonspecific ST changes  Recent Labs: 09/19/2020: TSH 3.700 09/20/2020: Magnesium 1.8 09/21/2020: ALT 86; BUN 5; Creatinine, Ser 0.58; Hemoglobin 12.1; Platelets 238; Potassium 3.9; Sodium 139  Recent Lipid Panel    Component Value Date/Time   CHOL 164 12/02/2020 0917   TRIG 262 (H) 12/02/2020 0917   HDL 48 12/02/2020 0917   CHOLHDL 3.4 12/02/2020 0917   CHOLHDL 5.0 09/29/2016 0731   VLDL 59 (H) 09/29/2016 0731   LDLCALC 74 12/02/2020 0917   LDLDIRECT 197.3 02/01/2010 0907     Risk Assessment/Calculations:       Physical Exam:    VS:  BP 120/84    Pulse (!) 107    Ht _0  (1.549 m)    Wt 151 lb 6.4 oz (68.7 kg)    LMP 09/30/1991 (Approximate) Comment: 1993   SpO2 96%    BMI 28.61 kg/m     Wt Readings from Last 3 Encounters:  06/30/21 151 lb 6.4 oz (68.7 kg)  06/06/21 148 lb (67.1 kg)  12/02/20 143 lb (64.9 kg)     GEN:  Well nourished, well developed in no acute distress HEENT: Normal NECK: No JVD; No carotid bruits LYMPHATICS: No lymphadenopathy CARDIAC: tachycardic, RR, no murmurs, rubs, gallops RESPIRATORY:  Clear to auscultation without rales, wheezing or rhonchi  ABDOMEN: Soft, non-tender, non-distended MUSCULOSKELETAL:  No edema; No deformity  SKIN: Warm and dry NEUROLOGIC:  Alert and oriented x 3 PSYCHIATRIC:  Normal affect   ASSESSMENT:    1. Sinus tachycardia   2. Paroxysmal SVT (supraventricular  tachycardia) ([place])   3. Hyperlipidemia, mixed   4. Orthostatic hypotension   5. Bilateral carotid artery stenosis    PLAN:    In order of problems listed above:  Inappropriate sinus tachycardia pSVT, PACs, PVCs Patient denies palpitations. She has known tachycardic. Heart rate 107bpm by EKG. We  will escalate Toprol to 10m daily. H/o orthostasis, so recommended she monitor symptoms and let [place] regarding symptoms.   HLD LDL 54 12/2020. Continue Lipitor 838mdaily.   Carotid artery stenosis, b/l Mild bilateral carotid atherosclerotic vascular disease. Continue statin.   H/o orthostatic hypotension One episode of orthostasis in the last 6 months. She will monitor symptoms with increase in BB.   Disposition: Follow up in 6 month(s) with MD/APP    Signed, Leeyah Heather H Ninfa MeekerPA-C  06/30/2021 10:21 AM    [facility]

## 2021-06-30 NOTE — Patient Instructions (Signed)
Medication Instructions:  Your physician has recommended you make the following change in your medication:   Increase Metoprolol succinate (Toprol XL) to 50 mg once a day  Contact GI for further management  *If you need a refill on your cardiac medications before your next appointment, please call your pharmacy*   Lab Work: None  If you have labs (blood work) drawn today and your tests are completely normal, you will receive your results only by: [place] (if you have MyChart) OR A paper copy in the mail If you have any lab test that is abnormal or we need to change your treatment, we will call you to review the results.   Testing/Procedures: None   Follow-Up: At [facility], you and your health needs are our priority.  As part of our continuing mission to provide you with exceptional heart care, we have created designated Provider Care Teams.  These Care Teams include your primary Cardiologist (physician) and Advanced Practice Providers (APPs -  Physician Assistants and Nurse Practitioners) who all work together to provide you with the care you need, when you need it.  Your next appointment:   6 month(s)  The format for your next appointment:   In Person  Provider:   Nelva Bush, MD or Cadence Kathlen Mody, [place]

## 2021-07-18 ENCOUNTER — Other Ambulatory Visit: Payer: Self-pay

## 2021-07-18 ENCOUNTER — Ambulatory Visit: Payer: Medicare Other | Attending: Pain Medicine | Admitting: Pain Medicine

## 2021-07-18 ENCOUNTER — Ambulatory Visit: Payer: Medicare Other | Admitting: Medical

## 2021-07-18 DIAGNOSIS — M549 Dorsalgia, unspecified: Secondary | ICD-10-CM

## 2021-07-18 DIAGNOSIS — M25512 Pain in left shoulder: Secondary | ICD-10-CM

## 2021-07-18 DIAGNOSIS — M7522 Bicipital tendinitis, left shoulder: Secondary | ICD-10-CM

## 2021-07-18 DIAGNOSIS — M19012 Primary osteoarthritis, left shoulder: Secondary | ICD-10-CM | POA: Diagnosis not present

## 2021-07-18 DIAGNOSIS — G8929 Other chronic pain: Secondary | ICD-10-CM

## 2021-07-18 NOTE — Patient Instructions (Signed)
______________________________________________________________________  Preparing for your procedure (without sedation)  Procedure appointments are limited to planned procedures: No Prescription Refills. No disability issues will be discussed. No medication changes will be discussed.  Instructions: Oral Intake: Do not eat or drink anything for at least 6 hours prior to your procedure. (Exception: Blood Pressure Medication. See below.) Transportation: Unless otherwise stated by your physician, you may drive yourself after the procedure. Blood Pressure Medicine: Do not forget to take your blood pressure medicine with a sip of water the morning of the procedure. If your Diastolic (lower reading)is above 100 mmHg, elective cases will be cancelled/rescheduled. Blood thinners: These will need to be stopped for procedures. Notify our staff if you are taking any blood thinners. Depending on which one you take, there will be specific instructions on how and when to stop it. Diabetics on insulin: Notify the staff so that you can be scheduled 1st case in the morning. If your diabetes requires high dose insulin, take only  of your normal insulin dose the morning of the procedure and notify the staff that you have done so. Preventing infections: Shower with an antibacterial soap the morning of your procedure.  Build-up your immune system: Take 1000 mg of Vitamin C with every meal (3 times a day) the day prior to your procedure. Antibiotics: Inform the staff if you have a condition or reason that requires you to take antibiotics before dental procedures. Pregnancy: If you are pregnant, call and cancel the procedure. Sickness: If you have a cold, fever, or any active infections, call and cancel the procedure. Arrival: You must be in the facility at least 30 minutes prior to your scheduled procedure. Children: Do not bring any children with you. Dress appropriately: Bring dark clothing that you would not mind  if they get stained. Valuables: Do not bring any jewelry or valuables.  Reasons to call and reschedule or cancel your procedure: (Following these recommendations will minimize the risk of a serious complication.) Surgeries: Avoid having procedures within 2 weeks of any surgery. (Avoid for 2 weeks before or after any surgery). Flu Shots: Avoid having procedures within 2 weeks of a flu shots or . (Avoid for 2 weeks before or after immunizations). Barium: Avoid having a procedure within 7-10 days after having had a radiological study involving the use of radiological contrast. (Myelograms, Barium swallow or enema study). Heart attacks: Avoid any elective procedures or surgeries for the initial 6 months after a "Myocardial Infarction" (Heart Attack). Blood thinners: It is imperative that you stop these medications before procedures. Let us know if you if you take any blood thinner.  Infection: Avoid procedures during or within two weeks of an infection (including chest colds or gastrointestinal problems). Symptoms associated with infections include: Localized redness, fever, chills, night sweats or profuse sweating, burning sensation when voiding, cough, congestion, stuffiness, runny nose, sore throat, diarrhea, nausea, vomiting, cold or Flu symptoms, recent or current infections. It is specially important if the infection is over the area that we intend to treat. Heart and lung problems: Symptoms that may suggest an active cardiopulmonary problem include: cough, chest pain, breathing difficulties or shortness of breath, dizziness, ankle swelling, uncontrolled high or unusually low blood pressure, and/or palpitations. If you are experiencing any of these symptoms, cancel your procedure and contact your primary care physician for an evaluation.  Remember:  Regular Business hours are:  Monday to Thursday 8:00 AM to 4:00 PM  Provider's Schedule: Natayah Warmack, MD:  Procedure days: Tuesday and Thursday    7:30 AM to 4:00 PM  Bilal Lateef, MD:  Procedure days: Monday and Wednesday 7:30 AM to 4:00 PM ______________________________________________________________________  ____________________________________________________________________________________________  General Risks and Possible Complications  Patient Responsibilities: It is important that you read this as it is part of your informed consent. It is our duty to inform you of the risks and possible complications associated with treatments offered to you. It is your responsibility as a patient to read this and to ask questions about anything that is not clear or that you believe was not covered in this document.  Patient's Rights: You have the right to refuse treatment. You also have the right to change your mind, even after initially having agreed to have the treatment done. However, under this last option, if you wait until the last second to change your mind, you may be charged for the materials used up to that point.  Introduction: Medicine is not an exact science. Everything in Medicine, including the lack of treatment(s), carries the potential for danger, harm, or loss (which is by definition: Risk). In Medicine, a complication is a secondary problem, condition, or disease that can aggravate an already existing one. All treatments carry the risk of possible complications. The fact that a side effects or complications occurs, does not imply that the treatment was conducted incorrectly. It must be clearly understood that these can happen even when everything is done following the highest safety standards.  No treatment: You can choose not to proceed with the proposed treatment alternative. The "PRO(s)" would include: avoiding the risk of complications associated with the therapy. The "CON(s)" would include: not getting any of the treatment benefits. These benefits fall under one of three categories: diagnostic; therapeutic; and/or palliative.  Diagnostic benefits include: getting information which can ultimately lead to improvement of the disease or symptom(s). Therapeutic benefits are those associated with the successful treatment of the disease. Finally, palliative benefits are those related to the decrease of the primary symptoms, without necessarily curing the condition (example: decreasing the pain from a flare-up of a chronic condition, such as incurable terminal cancer).  General Risks and Complications: These are associated to most interventional treatments. They can occur alone, or in combination. They fall under one of the following six (6) categories: no benefit or worsening of symptoms; bleeding; infection; nerve damage; allergic reactions; and/or death. No benefits or worsening of symptoms: In Medicine there are no guarantees, only probabilities. No healthcare provider can ever guarantee that a medical treatment will work, they can only state the probability that it may. Furthermore, there is always the possibility that the condition may worsen, either directly, or indirectly, as a consequence of the treatment. Bleeding: This is more common if the patient is taking a blood thinner, either prescription or over the counter (example: Goody Powders, Fish oil, Aspirin, Garlic, etc.), or if suffering a condition associated with impaired coagulation (example: Hemophilia, cirrhosis of the liver, low platelet counts, etc.). However, even if you do not have one on these, it can still happen. If you have any of these conditions, or take one of these drugs, make sure to notify your treating physician. Infection: This is more common in patients with a compromised immune system, either due to disease (example: diabetes, cancer, human immunodeficiency virus [HIV], etc.), or due to medications or treatments (example: therapies used to treat cancer and rheumatological diseases). However, even if you do not have one on these, it can still happen. If you  have any of these conditions, or take   one of these drugs, make sure to notify your treating physician. Nerve Damage: This is more common when the treatment is an invasive one, but it can also happen with the use of medications, such as those used in the treatment of cancer. The damage can occur to small secondary nerves, or to large primary ones, such as those in the spinal cord and brain. This damage may be temporary or permanent and it may lead to impairments that can range from temporary numbness to permanent paralysis and/or brain death. Allergic Reactions: Any time a substance or material comes in contact with our body, there is the possibility of an allergic reaction. These can range from a mild skin rash (contact dermatitis) to a severe systemic reaction (anaphylactic reaction), which can result in death. Death: In general, any medical intervention can result in death, most of the time due to an unforeseen complication. ____________________________________________________________________________________________  

## 2021-07-18 NOTE — [place] Notes (Addendum)
Patient: Wanda [name]  Service Category: E/M  Provider: Gaspar Cola, MD  DOB: [birth year]  DOS: 07/18/2021  Location: Office  MRN: [id number]  Setting: Ambulatory outpatient  Referring Provider: Barbaraann Boys, MD  Type: Established Patient  Specialty: Interventional Pain Management  PCP: Barbaraann Boys, MD  Location: Remote location  Delivery: TeleHealth     Virtual Encounter - Pain Management PROVIDER NOTE: Information contained herein reflects review and annotations entered in association with encounter. Interpretation of such information and data should be left to medically-trained personnel. Information provided to patient can be located elsewhere in the medical record under "Patient Instructions". Document created using STT-dictation technology, any transcriptional errors that may result from process are unintentional.    Contact & Pharmacy Preferred: [phone] Home: [phone] (home) Mobile: [phone] (mobile) E-mail: [name]  [facility] - [place] Glazier, [place] - [address] [place] [address] Phone: [phone] Fax: [phone]  [facility] [place], [place] - [address] [facility] [address] [place] [address] Phone: [phone] Fax: [phone]  [facility] [place], [place] - [facility] [address] [facility] [place] [address] Phone: [phone] Fax: [phone]   Pre-screening  Wanda [name] offered "in-person" vs "virtual" encounter. She indicated preferring virtual for this encounter.   Reason COVID-19*   Social distancing based on CDC and AMA recommendations.   I contacted Wanda [name] on 07/18/2021 via telephone.      I clearly identified myself as Gaspar Cola, MD. I verified that I was speaking with the correct person using two identifiers (Name: Wanda [name], and date of birth:  02/14/1956).  Consent I sought verbal advanced consent from Wanda [name] for virtual visit interactions. I informed Wanda [name] of possible security and privacy concerns, risks, and limitations associated with providing "not-in-person" medical evaluation and management services. I also informed Wanda [name] of the availability of "in-person" appointments. Finally, I informed her that there would be a charge for the virtual visit and that she could be  personally, fully or partially, financially responsible for it. Wanda [name] expressed understanding and agreed to proceed.   Historic Elements   Wanda [name] is a 66 y.o. year old, female patient evaluated today after our last contact on 06/20/2021. 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 She also  has a past surgical history that includes Shoulder surgery (Right); Appendectomy; Abdominal hysterectomy; Back surgery; Spinal cord stimulator; Colonoscopy (N/A, 08/28/2013); Intramedullary (im) nail intertrochanteric (Left, 05/30/2016); Spinal cord stimulator removal (N/A, 01/12/2020); Breast excisional biopsy (Right); Endoscopic retrograde cholangiopancreatography (ercp) with propofol (N/A, 09/20/2020); and Cholecystectomy (09/2020). Wanda [name] has a current medication list which includes the following prescription(s): alendronate,  atorvastatin, vitamin d3, dexlansoprazole, famotidine, hydroxyzine, metoprolol succinate, multivitamin, and vilazodone hcl. She  reports that she quit smoking about 31 years ago. Her smoking use included cigarettes. She has a 3.50 pack-year smoking history. She has never used smokeless tobacco. She reports current alcohol use. She  reports that she does not use drugs. Wanda [name] is allergic to levofloxacin, tramadol, pantoprazole, codeine, mobic [meloxicam], and propoxyphene hcl.   HPI  Today, she is being contacted for evaluation and review of the results of her left shoulder x-ray.  I have gone over those results with the patient today and I have explained the results in layman's terms.  She does seem to have some mild to moderate osteoarthritis affecting the acromioclavicular joint as well as the glenohumeral joint.  She also seems to be having some evidence of some loose bodies within the biceps tendon sheath.  I have explained to the patient the location of those 3 and the areas were typically they will refer pain to.  She is interested in seeing if she can have an injection into the shoulder to see if we can get this somewhat better.  We will go ahead and schedule that for her as soon as possible.  Pharmacotherapy Assessment   Opioid Analgesic: None MME/day: 0 mg/day   Monitoring: [place] PMP: PDMP reviewed during this encounter.       Pharmacotherapy: No side-effects or adverse reactions reported. Compliance: No problems identified. Effectiveness: Clinically acceptable. Plan: Refer to "POC". UDS: No results found for: SUMMARY   Laboratory Chemistry Profile   Renal Lab Results  Component Value Date   BUN 5 (L) 09/21/2020   CREATININE 0.58 09/21/2020   BCR 14 07/14/2020   GFRAA 110 07/14/2020   GFRNONAA >60 09/21/2020    Hepatic Lab Results  Component Value Date   AST 31 09/21/2020   ALT 86 (H) 09/21/2020   ALBUMIN 3.1 (L) 09/21/2020   ALKPHOS 130 (H) 09/21/2020   LIPASE 27  09/18/2020    Electrolytes Lab Results  Component Value Date   NA 139 09/21/2020   K 3.9 09/21/2020   CL 108 09/21/2020   CALCIUM 9.0 09/21/2020   MG 1.8 09/20/2020    Bone Lab Results  Component Value Date   25OHVITD1 46 07/16/2017   25OHVITD2 <1.0 07/16/2017   25OHVITD3 46 07/16/2017    Inflammation (CRP: Acute Phase) (ESR: Chronic Phase) Lab Results  Component Value Date   CRP 1 07/14/2020   ESRSEDRATE 3 07/14/2020         Note: Above Lab results reviewed.  Imaging  DG Shoulder Left CLINICAL DATA:  Chronic left shoulder pain.  EXAM: LEFT SHOULDER - 2+ VIEW  COMPARISON:  None.  FINDINGS: There is diffuse decreased bone mineralization. Mild to moderate inferior glenohumeral joint space narrowing with mild peripheral degenerative spurring. Moderate acromioclavicular joint space narrowing with mild peripheral degenerative osteophytosis. Mild distal lateral subacromial spurring. There are 3 or 4 ossicles measuring up to 6 mm individually overlying the proximal humerus, likely within the biceps tendon sheath and representing loose bodies. The visualized portion left lung is unremarkable.  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  Multiple loose bodies within the biceps tendon sheath.  Electronically Signed   By: Yvonne Kendall   On: 06/06/2021 09:24  Assessment  The primary encounter diagnosis was Chronic shoulder pain (Left). Diagnoses of Arthropathy of shoulder (Left), Biceps tendinitis of shoulder (Left), Osteoarthritis of glenohumeral joint (Left), Osteoarthritis of AC (acromioclavicular) joint (Left), and Trigger point with back pain were also pertinent to this visit.  Plan of Care  Problem-specific:  No problem-specific Assessment & Plan notes found for this encounter.  Ms. ELFIDA SHIMADA has a current medication list which includes the following long-term medication(s): atorvastatin, dexlansoprazole, famotidine,  metoprolol succinate, and vilazodone  hcl.  Pharmacotherapy (Medications Ordered): No orders of the defined types were placed in this encounter.  Orders:  Orders Placed This Encounter  Procedures   SHOULDER INJECTION    Standing Status:   Future    Standing Expiration Date:   10/15/2021    Scheduling Instructions:     Procedure: Intra-articular shoulder (Glenohumeral) joint and (AC) Acromioclavicular joint injection     Side: Left-sided     Level: Glenohumeral joint and (AC) Acromioclavicular joint     Sedation: Patient's choice.     Timeframe: As permitted by the schedule    Order Specific Question:   Where will this procedure be performed?    Answer:   [facility]   Follow-up plan:   Return for (Clinic) procedure: (L) IA Shoulder (AC & glenohumeral) inj..      Interventional Therapies  Risk   Complexity Considerations:   Estimated body mass index is 27.96 kg/m as calculated from the following:   Height as of this encounter: $RemoveBeforeD'5\' 1"'[id number]$  (1.549 m).   Weight as of this encounter: 148 lb (67.1 kg). WNL   Planned   Pending:   Diagnostic left IA shoulder joint injection (acromioclavicular and glenohumeral) #1    Under consideration:   Possible bilateral lumbar facet RFA, starting with the left side. Diagnostic (Midline) L1-2 LESI     Completed:   Medrol Dosepak for left shoulder pain (06/06/2021) (95% ongoing relief of the pain) Implant of Lumbar spinal cord stimulator (12/13/2012) (Dr. Dossie Arbour) (referral by Dr. Neomia Dear)  Removal of nonfunctional spinal cord stimulator (01/12/2020) (Dr. Lacinda Axon)  Diagnostic bilateral lumbar facet MBB x3 (06/28/2017) (100/100/70/75) (100/100/85/<50) (50/50/80/50)  Diagnostic left lumbar facet MBB x1 (02/20/2017) (100/100/100/100)  Diagnostic/therapeutic right erector spinae & quadratus lumborum muscle MNB x1 (07/24/2017) (100/100)   Therapeutic   Palliative (PRN) options:   Therapeutic/palliative lumbar facet MBB      Recent  Visits Date Type Provider Dept  06/20/21 Office Visit Milinda Pointer, [facility]  06/06/21 Office Visit Milinda Pointer, MD [facility]  Showing recent visits within past 90 days and meeting all other requirements Today's Visits Date Type Provider Dept  07/18/21 Office Visit Milinda Pointer, MD [facility]  Showing today's visits and meeting all other requirements Future Appointments No visits were found meeting these conditions. Showing future appointments within next 90 days and meeting all other requirements  I discussed the assessment and treatment plan with the patient. The patient was provided an opportunity to ask questions and all were answered. The patient agreed with the plan and demonstrated an understanding of the instructions.  Patient advised to call back or seek an in-person evaluation if the symptoms or condition worsens.  Duration of encounter: 16 minutes.  Note by: Gaspar Cola, MD Date: 07/18/2021; Time: 3:02 PM

## 2021-07-19 ENCOUNTER — Telehealth: Payer: Self-pay

## 2021-07-19 DIAGNOSIS — F411 Generalized anxiety disorder: Secondary | ICD-10-CM

## 2021-07-19 DIAGNOSIS — F4010 Social phobia, unspecified: Secondary | ICD-10-CM

## 2021-07-19 MED ORDER — HYDROXYZINE PAMOATE 25 MG PO CAPS: 25.0000 mg | ORAL_CAPSULE | Freq: Two times a day (BID) | ORAL | 2 refills | Status: DC | PRN

## 2021-07-19 NOTE — Telephone Encounter (Signed)
I have sent hydroxyzine to pharmacy with reduced dose instructions as per patient request.

## 2021-07-19 NOTE — Telephone Encounter (Signed)
pt called left a message that she needed a refill on the hydroxyzine.  can you send for 2 times a day instead of 3 times a day.

## 2021-07-20 NOTE — Patient Instructions (Addendum)
____________________________________________________________________________________________  Post-Procedure Discharge Instructions  Instructions: Apply ice:  Purpose: This will minimize any swelling and discomfort after procedure.  When: Day of procedure, as soon as you get home. How: Fill a plastic sandwich bag with crushed ice. Cover it with a small towel and apply to injection site. How long: (15 min on, 15 min off) Apply for 15 minutes then remove x 15 minutes.  Repeat sequence on day of procedure, until you go to bed. Apply heat:  Purpose: To treat any soreness and discomfort from the procedure. When: Starting the next day after the procedure. How: Apply heat to procedure site starting the day following the procedure. How long: May continue to repeat daily, until discomfort goes away. Food intake: Start with clear liquids (like water) and advance to regular food, as tolerated.  Physical activities: Keep activities to a minimum for the first 8 hours after the procedure. After that, then as tolerated. Driving: If you have received any sedation, be responsible and do not drive. You are not allowed to drive for 24 hours after having sedation. Blood thinner: (Applies only to those taking blood thinners) You may restart your blood thinner 6 hours after your procedure. Insulin: (Applies only to Diabetic patients taking insulin) As soon as you can eat, you may resume your normal dosing schedule. Infection prevention: Keep procedure site clean and dry. Shower daily and clean area with soap and water. Post-procedure Pain Diary: Extremely important that this be done correctly and accurately. Recorded information will be used to determine the next step in treatment. For the purpose of accuracy, follow these rules: Evaluate only the area treated. Do not report or include pain from an untreated area. For the purpose of this evaluation, ignore all other areas of pain, except for the treated area. After  your procedure, avoid taking a long nap and attempting to complete the pain diary after you wake up. Instead, set your alarm clock to go off every hour, on the hour, for the initial 8 hours after the procedure. Document the duration of the numbing medicine, and the relief you are getting from it. Do not go to sleep and attempt to complete it later. It will not be accurate. If you received sedation, it is likely that you were given a medication that may cause amnesia. Because of this, completing the diary at a later time may cause the information to be inaccurate. This information is needed to plan your care. Follow-up appointment: Keep your post-procedure follow-up evaluation appointment after the procedure (usually 2 weeks for most procedures, 6 weeks for radiofrequencies). DO NOT FORGET to bring you pain diary with you.   Expect: (What should I expect to see with my procedure?) From numbing medicine (AKA: Local Anesthetics): Numbness or decrease in pain. You may also experience some weakness, which if present, could last for the duration of the local anesthetic. Onset: Full effect within 15 minutes of injected. Duration: It will depend on the type of local anesthetic used. On the average, 1 to 8 hours.  From steroids (Applies only if steroids were used): Decrease in swelling or inflammation. Once inflammation is improved, relief of the pain will follow. Onset of benefits: Depends on the amount of swelling present. The more swelling, the longer it will take for the benefits to be seen. In some cases, up to 10 days. Duration: Steroids will stay in the system x 2 weeks. Duration of benefits will depend on multiple posibilities including persistent irritating factors. Side-effects: If present, they   may typically last 2 weeks (the duration of the steroids). Frequent: Cramps (if they occur, drink Gatorade and take over-the-counter Magnesium 450-500 mg once to twice a day); water retention with temporary  weight gain; increases in blood sugar; decreased immune system response; increased appetite. Occasional: Facial flushing (red, warm cheeks); mood swings; menstrual changes. Uncommon: Long-term decrease or suppression of natural hormones; bone thinning. (These are more common with higher doses or more frequent use. This is why we prefer that our patients avoid having any injection therapies in other practices.)  Very Rare: Severe mood changes; psychosis; aseptic necrosis. From procedure: Some discomfort is to be expected once the numbing medicine wears off. This should be minimal if ice and heat are applied as instructed.  Call if: (When should I call?) You experience numbness and weakness that gets worse with time, as opposed to wearing off. New onset bowel or bladder incontinence. (Applies only to procedures done in the spine)  Emergency Numbers: Durning business hours (Monday - Thursday, 8:00 AM - 4:00 PM) (Friday, 9:00 AM - 12:00 Noon): [phone] After hours: [phone] NOTE: If you are having a problem and are unable connect with, or to talk to a provider, then go to your nearest urgent care or emergency department. If the problem is serious and urgent, please call 911. ____________________________________________________________________________________________ ____________________________________________________________________________________________  Virtual Visits   What is a "Virtual Visit"? It is a healthcare communication encounter (medical visit) that takes place on real time (NOT TEXT or E-MAIL) over the telephone or computer device (desktop, laptop, tablet, smart phone, etc.). It allows for more location flexibility between the patient and the healthcare provider.  Who decides when these types of visits will be used? The physician.  Who is eligible for these types of visits? Only those patients that can be reliably reached over the telephone.  What do you mean by  reliably? We do not have time to call everyone multiple times, therefore those that tend to screen calls and then call back later are not suitable candidates for this system. We understand how people are reluctant to pickup on "unknown" calls, therefore, we suggest adding our telephone numbers to your list of "CONTACT(s)". This way, you should be able to readily identify our calls when you receive one. All of our numbers are available below.   Who is not eligible? This option is not available for medication management encounters, specially for controlled substances. Patients on pain medications that fall under the category of controlled substances have to come in for "Face-to-Face" encounters. This is required for mandatory monitoring of these substances. You may be asked to provide a sample for an unannounced urine drug screening test (UDS), and we will need to count your pain pills. Not bringing your pills to be counted may result in no refill. Obviously, neither one of these can be done over the phone.  When will this type of visits be used? You can request a virtual visit whenever you are physically unable to attend a regular appointment. The decision will be made by the physician (or healthcare provider) on a case by case basis.   At what time will I be called? This is an excellent question. The providers will try to call you whenever they have time available. Do not expect to be called at any specific time. The secretaries will assign you a time for your virtual visit appointment, but this is done simply to keep a list of those patients that need to be called, but   not for the purpose of keeping a time schedule. Be advised that the call may come in anytime during the day, between the hours of 8:00 AM and 8::00 PM, depending on provider availability. We do understand that the system is not perfect. If you are unable to be available that day on a moments notice, then request an "in-person" appointment  rather than a "virtual visit".  Can I request my medication visits to be "Virtual"? Yes you may request it, but the decision is entirely up to the healthcare provider. Control substances require specific monitoring that requires Face-to-Face encounters. The number of encounters  and the extent of the monitoring is determined on a case by case basis.  Add a new contact to your smart phone and label it "PAIN CLINIC" Under this contact add the following numbers: Main: [phone] (Official Contact Number) Nurses: [phone] (These are outgoing only calling systems. Do not call this number.) Dr. Ladina Shutters: [phone] or [phone] (Outgoing calls only. Do not call this number.)  ____________________________________________________________________________________________    

## 2021-07-20 NOTE — Progress Notes (Signed)
PROVIDER NOTE: Interpretation of information contained herein should be left to medically-trained personnel. Specific patient instructions are provided elsewhere under "Patient Instructions" section of medical record. This document was created in part using STT-dictation technology, any transcriptional errors that may result from this process are unintentional.  Patient: Wanda [name] Type: Established DOB: 03-09-1956 MRN: [id number] PCP: Barbaraann Boys, MD  Service: Procedure DOS: 07/21/2021 Setting: Ambulatory Location: Ambulatory outpatient facility Delivery: Face-to-face Provider: Gaspar Cola, MD Specialty: Interventional Pain Management Specialty designation: 09 Location: Outpatient facility Ref. Prov.: Barbaraann Boys, MD    Primary Reason for Visit: Interventional Pain Management Treatment. CC: Shoulder Pain (left)    Procedure:          Anesthesia, Analgesia, Anxiolysis:  Type: Diagnostic Glenohumeral and acromioclavicular joint Injection #1  Primary Purpose: Diagnostic Region: Superior Shoulder Area Level:  Shoulder Target Area: Glenohumeral and acromioclavicular joint Approach: Anterior approach. Laterality: Left  Anesthesia: Local (1-2% Lidocaine)  Anxiolysis: None  Sedation: None  Guidance: Fluoroscopy           Position: Supine   1. Biceps tendinitis of shoulder (Left)   2. Arthropathy of shoulder (Left)   3. Chronic shoulder pain (Left)   4. Osteoarthritis of AC (acromioclavicular) joint (Left)   5. Osteoarthritis of glenohumeral joint (Left)    NAS-11 Pain score:   Pre-procedure: 8 /10   Post-procedure: 7 /10      Pre-op H&P Assessment:  Wanda [name] is a 66 y.o. (year old), female patient, seen today for interventional treatment. She  has a past surgical history that includes Shoulder surgery (Right); Appendectomy; Abdominal hysterectomy; Back surgery; Spinal cord stimulator; Colonoscopy (N/A, 08/28/2013); Intramedullary (im) nail intertrochanteric  (Left, 05/30/2016); Spinal cord stimulator removal (N/A, 01/12/2020); Breast excisional biopsy (Right); Endoscopic retrograde cholangiopancreatography (ercp) with propofol (N/A, 09/20/2020); and Cholecystectomy (09/2020). Wanda [name] has a current medication list which includes the following prescription(s): alendronate, atorvastatin, vitamin d3, dexlansoprazole, famotidine, hydroxyzine, metoprolol succinate, multivitamin, and vilazodone hcl. Her primarily concern today is the Shoulder Pain (left)  Initial Vital Signs:  Pulse/HCG Rate: 100ECG Heart Rate: 89 Temp: 98.1 F (36.7 C) Resp: 16 BP: (!) 130/99 SpO2: 100 %  BMI: Estimated body mass index is 28.34 kg/m as calculated from the following:   Height as of this encounter: 5\' 1"  (1.549 m).   Weight as of this encounter: 150 lb (68 kg).  Risk Assessment: Allergies: Reviewed. She is allergic to levofloxacin, tramadol, pantoprazole, codeine, mobic [meloxicam], and propoxyphene hcl.  Allergy Precautions: None required Coagulopathies: Reviewed. None identified.  Blood-thinner therapy: None at this time Active Infection(s): Reviewed. None identified. Wanda [name] is afebrile  Site Confirmation: Wanda [name] was asked to confirm the procedure and laterality before marking the site Procedure checklist: Completed Consent: Before the procedure and under the influence of no sedative(s), amnesic(s), or anxiolytics, the patient was informed of the treatment options, risks and possible complications. To fulfill our ethical and legal obligations, as recommended by the American Medical Association's Code of Ethics, I have informed the patient of my clinical impression; the nature and purpose of the treatment or procedure; the risks, benefits, and possible complications of the intervention; the alternatives, including doing nothing; the risk(s) and benefit(s) of the alternative treatment(s) or procedure(s); and the risk(s) and benefit(s) of doing nothing. The  patient was provided information about the general risks and possible complications associated with the procedure. These may include, but are not limited to: failure to achieve desired goals, infection, bleeding, organ or nerve damage, allergic reactions,  paralysis, and death. In addition, the patient was informed of those risks and complications associated to the procedure, such as failure to decrease pain; infection; bleeding; organ or nerve damage with subsequent damage to sensory, motor, and/or autonomic systems, resulting in permanent pain, numbness, and/or weakness of one or several areas of the body; allergic reactions; (i.e.: anaphylactic reaction); and/or death. Furthermore, the patient was informed of those risks and complications associated with the medications. These include, but are not limited to: allergic reactions (i.e.: anaphylactic or anaphylactoid reaction(s)); adrenal axis suppression; blood sugar elevation that in diabetics may result in ketoacidosis or comma; water retention that in patients with history of congestive heart failure may result in shortness of breath, pulmonary edema, and decompensation with resultant heart failure; weight gain; swelling or edema; medication-induced neural toxicity; particulate matter embolism and blood vessel occlusion with resultant organ, and/or nervous system infarction; and/or aseptic necrosis of one or more joints. Finally, the patient was informed that Medicine is not an exact science; therefore, there is also the possibility of unforeseen or unpredictable risks and/or possible complications that may result in a catastrophic outcome. The patient indicated having understood very clearly. We have given the patient no guarantees and we have made no promises. Enough time was given to the patient to ask questions, all of which were answered to the patient's satisfaction. Wanda [name] has indicated that she wanted to continue with the procedure. Attestation:  I, the ordering provider, attest that I have discussed with the patient the benefits, risks, side-effects, alternatives, likelihood of achieving goals, and potential problems during recovery for the procedure that I have provided informed consent. Date   Time: 07/21/2021  7:55 AM  Pre-Procedure Preparation:  Monitoring: As per clinic protocol. Respiration, ETCO2, SpO2, BP, heart rate and rhythm monitor placed and checked for adequate function Safety Precautions: Patient was assessed for positional comfort and pressure points before starting the procedure. Time-out: I initiated and conducted the "Time-out" before starting the procedure, as per protocol. The patient was asked to participate by confirming the accuracy of the "Time Out" information. Verification of the correct person, site, and procedure were performed and confirmed by me, the nursing staff, and the patient. "Time-out" conducted as per Joint Commission's Universal Protocol (UP.01.01.01). Time: 0811  Description of Procedure:          Area Prepped: Entire shoulder Area DuraPrep (Iodine Povacrylex [0.7% available iodine] and Isopropyl Alcohol, 74% w/w) Safety Precautions: Aspiration looking for blood return was conducted prior to all injections. At no point did we inject any substances, as a needle was being advanced. No attempts were made at seeking any paresthesias. Safe injection practices and needle disposal techniques used. Medications properly checked for expiration dates. SDV (single dose vial) medications used. Description of the Procedure: Protocol guidelines were followed. The patient was placed in position over the procedure table. The target area was identified and the area prepped in the usual manner. Skin & deeper tissues infiltrated with local anesthetic. Appropriate amount of time allowed to pass for local anesthetics to take effect. The procedure needles were then advanced to the target area. Proper needle placement secured.  Negative aspiration confirmed. Solution injected in intermittent fashion, asking for systemic symptoms every 0.5cc of injectate. The needles were then removed and the area cleansed, making sure to leave some of the prepping solution back to take advantage of its long term bactericidal properties.         Vitals:   07/21/21 2778 07/21/21 0810 07/21/21 0815 07/21/21  0818  BP: (!) 130/99 (!) 141/93 129/73 128/87  Pulse: 100     Resp: 16 13 20 14   Temp: 32.2 F (36.7 C)     SpO2: 100% 99% 99% 100%  Weight: 150 lb (68 kg)     Height: 5\' 1"  (1.549 m)       Start Time: 0811 hrs. End Time: 0818 hrs. Materials:  Needle(s) Type: Spinal Needle Gauge: 22G Length: 3.5-in Medication(s): Please see orders for medications and dosing details.  Imaging Guidance (Non-Spinal):          Type of Imaging Technique: Fluoroscopy Guidance (Non-Spinal) Indication(s): Assistance in needle guidance and placement for procedures requiring needle placement in or near specific anatomical locations not easily accessible without such assistance. Exposure Time: Please see nurses notes. Contrast: None used. Fluoroscopic Guidance: I was personally present during the use of fluoroscopy. "Tunnel Vision Technique" used to obtain the best possible view of the target area. Parallax error corrected before commencing the procedure. "Direction-depth-direction" technique used to introduce the needle under continuous pulsed fluoroscopy. Once target was reached, antero-posterior, oblique, and lateral fluoroscopic projection used confirm needle placement in all planes. Images permanently stored in EMR. Interpretation: No contrast injected. I personally interpreted the imaging intraoperatively. Adequate needle placement confirmed in multiple planes. Permanent images saved into the patient's record.  Antibiotic Prophylaxis:   Anti-infectives (From admission, onward)    None      Indication(s): None identified  Post-operative  Assessment:  Post-procedure Vital Signs:  Pulse/HCG Rate: 10093 Temp: 98.1 F (36.7 C) Resp: 14 BP: 128/87 SpO2: 100 %  EBL: None  Complications: No immediate post-treatment complications observed by team, or reported by patient.  Note: The patient tolerated the entire procedure well. A repeat set of vitals were taken after the procedure and the patient was kept under observation following institutional policy, for this type of procedure. Post-procedural neurological assessment was performed, showing return to baseline, prior to discharge. The patient was provided with post-procedure discharge instructions, including a section on how to identify potential problems. Should any problems arise concerning this procedure, the patient was given instructions to immediately contact us, at any time, without hesitation. In any case, we plan to contact the patient by telephone for a follow-up status report regarding this interventional procedure.  Comments:  No additional relevant information.  Plan of Care  Orders:  Orders Placed This Encounter  Procedures   SHOULDER INJECTION    Scheduling Instructions:     Procedure: Intra-articular shoulder (Glenohumeral) joint and (AC) Acromioclavicular joint injection     Side: Left-sided     Level: Glenohumeral joint and (AC) Acromioclavicular joint     Sedation: Patient's choice.     Timeframe: Today    Order Specific Question:   Where will this procedure be performed?    Answer:   [facility]   DG PAIN [facility] C-ARM 1-60 MIN NO REPORT    Intraoperative interpretation by procedural physician at [place].    Standing Status:   Standing    Number of Occurrences:   1    Order Specific Question:   Reason for exam:    Answer:   Assistance in needle guidance and placement for procedures requiring needle placement in or near specific anatomical locations not easily accessible without such assistance.   Informed Consent Details:  Physician/Practitioner Attestation; Transcribe to consent form and obtain patient signature    Note: Always confirm laterality of pain with Ms. Dumas, before procedure.    Order  Specific Question:   Physician/Practitioner attestation of informed consent for procedure/surgical case    Answer:   I, the physician/practitioner, attest that I have discussed with the patient the benefits, risks, side effects, alternatives, likelihood of achieving goals and potential problems during recovery for the procedure that I have provided informed consent.    Order Specific Question:   Procedure    Answer:   Intra-articular shoulder joint injection under fluoroscopic guidance    Order Specific Question:   Physician/Practitioner performing the procedure    Answer:   Dewarren Ledbetter A. Dossie Arbour, MD    Order Specific Question:   Indication/Reason    Answer:   Chronic shoulder pain secondary to shoulder arthropathy   Provide equipment / supplies at bedside    "Block Tray" (Disposable   single use) Needle type: SpinalSpinal Amount/quantity: 1 Size: Regular (3.5-inch) Gauge: 22G    Standing Status:   Standing    Number of Occurrences:   1    Order Specific Question:   Specify    Answer:   Block Tray   Chronic Opioid Analgesic:  None MME/day: 0 mg/day   Medications ordered for procedure: Meds ordered this encounter  Medications   lidocaine (XYLOCAINE) 2 % (with pres) injection 400 mg   pentafluoroprop-tetrafluoroeth (GEBAUERS) aerosol   methylPREDNISolone acetate (DEPO-MEDROL) injection 80 mg   ropivacaine (PF) 2 mg/mL (0.2%) (NAROPIN) injection 9 mL   Medications administered: We administered lidocaine, pentafluoroprop-tetrafluoroeth, methylPREDNISolone acetate, and ropivacaine (PF) 2 mg/mL (0.2%).  See the medical record for exact dosing, route, and time of administration.  Follow-up plan:   Return in about 2 weeks (around 08/04/2021) for Proc-day (T,Th), (VV), (PPE).        Interventional Therapies   Risk   Complexity Considerations:   Estimated body mass index is 27.96 kg/m as calculated from the following:   Height as of this encounter: 5\' 1"  (1.549 m).   Weight as of this encounter: 148 lb (67.1 kg). WNL   Planned   Pending:   Diagnostic left IA shoulder joint injection (acromioclavicular and glenohumeral) #1    Under consideration:   Possible bilateral lumbar facet RFA, starting with the left side. Diagnostic (Midline) L1-2 LESI     Completed:   Medrol Dosepak for left shoulder pain (06/06/2021) (95% ongoing relief of the pain) Implant of Lumbar spinal cord stimulator (12/13/2012) (Dr. Dossie Arbour) (referral by Dr. Neomia Dear)  Removal of nonfunctional spinal cord stimulator (01/12/2020) (Dr. Lacinda Axon)  Diagnostic bilateral lumbar facet MBB x3 (06/28/2017) (100/100/70/75) (100/100/85/<50) (50/50/80/50)  Diagnostic left lumbar facet MBB x1 (02/20/2017) (100/100/100/100)  Diagnostic/therapeutic right erector spinae & quadratus lumborum muscle MNB x1 (07/24/2017) (100/100)   Therapeutic   Palliative (PRN) options:   Therapeutic/palliative lumbar facet MBB       Recent Visits Date Type Provider Dept  07/18/21 Office Visit Milinda Pointer, MD [facility]  06/20/21 Office Visit Milinda Pointer, MD [facility]  06/06/21 Office Visit Milinda Pointer, MD [facility]  Showing recent visits within past 90 days and meeting all other requirements Today's Visits Date Type Provider Dept  07/21/21 Procedure visit Milinda Pointer, MD [facility]  Showing today's visits and meeting all other requirements Future Appointments Date Type Provider Dept  08/04/21 Appointment Milinda Pointer, MD [facility]  Showing future appointments within next 90 days and meeting all other requirements  Disposition: Discharge home  Discharge (Date   Time): 07/21/2021; 0830 hrs.   Primary Care Physician: Barbaraann Boys, MD Location: [facility]  Outpatient  Pain Management Facility Note by: Gaspar Cola, MD Date: 07/21/2021; Time: 9:07 AM  Disclaimer:  Medicine is not an Chief Strategy Officer. The only guarantee in medicine is that nothing is guaranteed. It is important to note that the decision to proceed with this intervention was based on the information collected from the patient. The Data and conclusions were drawn from the patient's questionnaire, the interview, and the physical examination. Because the information was provided in large part by the patient, it cannot be guaranteed that it has not been purposely or unconsciously manipulated. Every effort has been made to obtain as much relevant data as possible for this evaluation. It is important to note that the conclusions that lead to this procedure are derived in large part from the available data. Always take into account that the treatment will also be dependent on availability of resources and existing treatment guidelines, considered by other Pain Management Practitioners as being common knowledge and practice, at the time of the intervention. For Medico-Legal purposes, it is also important to point out that variation in procedural techniques and pharmacological choices are the acceptable norm. The indications, contraindications, technique, and results of the above procedure should only be interpreted and judged by a Board-Certified Interventional Pain Specialist with extensive familiarity and expertise in the same exact procedure and technique.

## 2021-07-21 ENCOUNTER — Ambulatory Visit (HOSPITAL_BASED_OUTPATIENT_CLINIC_OR_DEPARTMENT_OTHER): Payer: Medicare Other | Admitting: Pain Medicine

## 2021-07-21 ENCOUNTER — Other Ambulatory Visit: Payer: Self-pay

## 2021-07-21 ENCOUNTER — Encounter: Payer: Self-pay | Admitting: Pain Medicine

## 2021-07-21 ENCOUNTER — Ambulatory Visit
Admission: RE | Admit: 2021-07-21 | Discharge: 2021-07-21 | Disposition: A | Discharge status: Home / Self Care | Payer: Medicare Other | Source: Ambulatory Visit | Attending: Pain Medicine | Admitting: Pain Medicine

## 2021-07-21 VITALS — BP 128/87 | HR 100 | Temp 98.1°F | Resp 14 | Ht 61.0 in | Wt 150.0 lb

## 2021-07-21 DIAGNOSIS — M7522 Bicipital tendinitis, left shoulder: Secondary | ICD-10-CM | POA: Diagnosis present

## 2021-07-21 DIAGNOSIS — G8929 Other chronic pain: Secondary | ICD-10-CM | POA: Diagnosis present

## 2021-07-21 DIAGNOSIS — M25512 Pain in left shoulder: Secondary | ICD-10-CM | POA: Diagnosis present

## 2021-07-21 DIAGNOSIS — M19012 Primary osteoarthritis, left shoulder: Secondary | ICD-10-CM | POA: Diagnosis present

## 2021-07-21 MED ORDER — LIDOCAINE HCL 2 % IJ SOLN
20.0000 mL | Freq: Once | INTRAMUSCULAR | Status: AC
  Administered 2021-07-21: 400 mg

## 2021-07-21 MED ORDER — METHYLPREDNISOLONE ACETATE 80 MG/ML IJ SUSP
INTRAMUSCULAR | Status: AC
  Filled 2021-07-21: qty 1

## 2021-07-21 MED ORDER — ROPIVACAINE HCL 2 MG/ML IJ SOLN
9.0000 mL | Freq: Once | INTRAMUSCULAR | Status: AC
  Administered 2021-07-21: 20 mL via INTRA_ARTICULAR

## 2021-07-21 MED ORDER — LIDOCAINE HCL 2 % IJ SOLN
INTRAMUSCULAR | Status: AC
Start: 2021-07-21 — End: ?
  Filled 2021-07-21: qty 20

## 2021-07-21 MED ORDER — PENTAFLUOROPROP-TETRAFLUOROETH EX AERO
INHALATION_SPRAY | Freq: Once | CUTANEOUS | Status: AC
  Administered 2021-07-21: 30 via TOPICAL
  Filled 2021-07-21: qty 116

## 2021-07-21 MED ORDER — METHYLPREDNISOLONE ACETATE 80 MG/ML IJ SUSP
80.0000 mg | Freq: Once | INTRAMUSCULAR | Status: AC
  Administered 2021-07-21: 80 mg via INTRA_ARTICULAR

## 2021-07-21 MED ORDER — ROPIVACAINE HCL 2 MG/ML IJ SOLN
INTRAMUSCULAR | Status: AC
  Filled 2021-07-21: qty 20

## 2021-07-21 NOTE — Progress Notes (Signed)
Safety precautions to be maintained throughout the outpatient stay will include: orient to surroundings, keep bed in low position, maintain call bell within reach at all times, provide assistance with transfer out of bed and ambulation.  

## 2021-07-22 ENCOUNTER — Telehealth: Payer: Self-pay | Admitting: *Deleted

## 2021-07-22 ENCOUNTER — Other Ambulatory Visit: Payer: Self-pay | Admitting: Medical

## 2021-07-22 ENCOUNTER — Other Ambulatory Visit: Payer: Self-pay | Admitting: Internal Medicine

## 2021-07-22 NOTE — Telephone Encounter (Signed)
*  STAT* If patient is at the pharmacy, call can be transferred to refill team.   1. Which medications need to be refilled? (please list name of each medication and dose if known) Potassium  2. Which pharmacy/location (including street and city if local pharmacy) is medication to be sent to?[facility] [place]  3. Do they need a 30 day or 90 day supply? [place]

## 2021-07-22 NOTE — Telephone Encounter (Signed)
No problems post procedure. 

## 2021-07-25 ENCOUNTER — Ambulatory Visit: Payer: Medicare Other | Admitting: Medical

## 2021-07-25 NOTE — Telephone Encounter (Signed)
No refill needed for metoprolol.

## 2021-07-25 NOTE — Telephone Encounter (Signed)
Contacted pt to verify potassium medication not listed on pt's current medication list or last ov note. Pt confirmed she is taking Potassium 20 meq 1 tablet twice daily. Please advise pt needing new rx.

## 2021-07-26 ENCOUNTER — Other Ambulatory Visit: Payer: Self-pay | Admitting: Emergency Medicine

## 2021-07-26 MED ORDER — POTASSIUM CHLORIDE CRYS ER 20 MEQ PO TBCR: 20.0000 meq | EXTENDED_RELEASE_TABLET | Freq: Two times a day (BID) | ORAL | 0 refills | Status: DC

## 2021-07-26 NOTE — Progress Notes (Signed)
Entered in error

## 2021-07-26 NOTE — Telephone Encounter (Signed)
Patient is calling back regarding her potassium refill. Please call and advise status

## 2021-08-01 ENCOUNTER — Other Ambulatory Visit: Payer: Self-pay

## 2021-08-01 ENCOUNTER — Ambulatory Visit (INDEPENDENT_AMBULATORY_CARE_PROVIDER_SITE_OTHER): Payer: Medicare Other | Admitting: Licensed Clinical Social Worker

## 2021-08-01 DIAGNOSIS — Z634 Disappearance and death of family member: Secondary | ICD-10-CM | POA: Diagnosis not present

## 2021-08-01 DIAGNOSIS — F411 Generalized anxiety disorder: Secondary | ICD-10-CM

## 2021-08-01 DIAGNOSIS — F3341 Major depressive disorder, recurrent, in partial remission: Secondary | ICD-10-CM

## 2021-08-01 NOTE — Progress Notes (Signed)
Virtual Visit via Audio Note ? ?I connected with [name] on 08/01/21 at 11:00 AM EST by an audio enabled telemedicine application and verified that I am speaking with the correct person using two identifiers. ? ?Location: ?Patient: home ?Provider: remote office [place], [place]) ?  ?I discussed the limitations of evaluation and management by telemedicine and the availability of in person appointments. The patient expressed understanding and agreed to proceed. ?  ?I discussed the assessment and treatment plan with the patient. The patient was provided an opportunity to ask questions and all were answered. The patient agreed with the plan and demonstrated an understanding of the instructions. ?  ?The patient was advised to call back or seek an in-person evaluation if the symptoms worsen or if the condition fails to improve as anticipated. ? ?I provided 60 minutes of non-face-to-face time during this encounter. ? ? ?Fin Hupp R Kathyjo Briere, LCSW ? ? ?THERAPIST PROGRESS NOTE ? ?Session Time: 09N-23F ? ?Participation Level: Active ? ?Behavioral Response: NAAlertDepressed ? ?Type of Therapy: Family Therapy ? ?Treatment Goals addressed: PProblem: Anxiety Disorder CCP Problem  1 Reduce overall frequency, intensity, and duration of the anxiety so that daily functioning is not impaired per pt self report 3 out of 5 sessions documented.   ?Goal: LTG: Patient will score less than 5 on the Generalized Anxiety Disorder 7 Scale (GAD-7) ?Outcome: Progressing ?Goal: STG: Patient will participate in at least 80% of scheduled individual psychotherapy sessions ?Outcome: Progressingroblem: Depression CCP Problem  1 Decrease depressive symptoms and improve levels of effective functioning-pt reports a decrease in overall depression symptoms 3 out of 5 sessions documented.  ?Goal: LTG: Reduce frequency, intensity, and duration of depression symptoms as evidenced by: SSB input needed on appropriate metric ?Outcome: Progressing ?Note: Per  self report symptoms fluctuating at times--situationally triggered ?Goal: STG: Kymberlee WILL PARTICIPATE IN AT LEAST 80% OF SCHEDULED INDIVIDUAL PSYCHOTHERAPY SESSIONS ?Outcome: Progressing ? ?ProgressTowards Goals: Progressing ? ?Interventions: CBT, Solution Focused, and Other: grief counseling ? ?Summary: MARCILLE BARMAN is a 66 y.o. female who presents with continuing symptoms related to depression, anxiety, and bereavement. Pt reports that she feels depressed situationally--doesn't feel that way for extended days or weeks.Pt reporting no SI. ? ?Pt reports that she is getting better quality and quantity of sleep using melatonin and magnesium. Pt is working with her psychiatrist to monitor coming off rx sleep meds. Pt made this decision and is happy about it. ? ?Allowed pt to explore and express thoughts and feelings associated with recent life situations and external stressors. Continued discussing relationship between pt and her late daughter. Discussed where pt is in the grieving process and how present triggers are keeping the trauma of the situation open. Discussed management of symptoms. ? ?Explored pt relationship with sister--very close with a sister with recent cancer dx. Discussed relationships with other siblings and how conflict was triggered by the death of pts parents and estate divisions. ? ?Continued recommendations are as follows: self care behaviors, positive social engagements, focusing on overall work/home/life balance, and focusing on positive physical and emotional wellness.  ? ?Suicidal/Homicidal: No ? ?Therapist Response: Pt is continuing to apply interventions learned in session into daily life situations. Pt is currently on track to meet goals utilizing interventions mentioned above. Personal growth and progress noted. Treatment to continue as indicated.  ? ?Plan: Return again in 4 weeks. ? ?Diagnosis: MDD (major depressive disorder), recurrent, in partial remission ([place]) ? ?GAD (generalized  anxiety disorder) ? ?Bereavement ? ?Collaboration of Care: Other  Pt recommended to  ? ?Patient/Guardian was advised Release of Information must be obtained prior to any record release in order to collaborate their care with an outside provider. Patient/Guardian was advised if they have not already done so to contact the registration department to sign all necessary forms in order for [place] to release information regarding their care.  ? ?Consent: Patient/Guardian gives verbal consent for treatment and assignment of benefits for services provided during this visit. Patient/Guardian expressed understanding and agreed to proceed.  ? ?JAVAYA OREGON, LCSW ?08/01/2021 ? ?

## 2021-08-01 NOTE — Plan of Care (Signed)
?  Problem: Depression CCP Problem  1 Decrease depressive symptoms and improve levels of effective functioning-pt reports a decrease in overall depression symptoms 3 out of 5 sessions documented.  ?Goal: LTG: Reduce frequency, intensity, and duration of depression symptoms as evidenced by: SSB input needed on appropriate metric ?Outcome: Progressing ?Note: Per self report symptoms fluctuating at times--situationally triggered ?Goal: STG: Wanda [name] WILL PARTICIPATE IN AT LEAST 80% OF SCHEDULED INDIVIDUAL PSYCHOTHERAPY SESSIONS ?Outcome: Progressing ?Intervention: Assist with coping skills and behavior ?Intervention: Encourage verbalization of feelings/concerns/expectations ?Intervention: REVIEW PLEASE SKILLS (TREAT PHYSICAL ILLNESS, BALANCE EATING, AVOID MOOD-ALTERING SUBSTANCES, BALANCE SLEEP AND GET EXERCISE) WITH [name] ?  ?Problem: Anxiety Disorder CCP Problem  1 Reduce overall frequency, intensity, and duration of the anxiety so that daily functioning is not impaired per pt self report 3 out of 5 sessions documented.   ?Goal: LTG: Patient will score less than 5 on the Generalized Anxiety Disorder 7 Scale (GAD-7) ?Outcome: Progressing ?Goal: STG: Patient will participate in at least 80% of scheduled individual psychotherapy sessions ?Outcome: Progressing ?  ?

## 2021-08-04 ENCOUNTER — Other Ambulatory Visit: Payer: Self-pay

## 2021-08-04 ENCOUNTER — Ambulatory Visit: Payer: Medicare Other | Attending: Pain Medicine | Admitting: Pain Medicine

## 2021-08-04 DIAGNOSIS — M542 Cervicalgia: Secondary | ICD-10-CM

## 2021-08-04 DIAGNOSIS — M25512 Pain in left shoulder: Secondary | ICD-10-CM

## 2021-08-04 DIAGNOSIS — G894 Chronic pain syndrome: Secondary | ICD-10-CM

## 2021-08-04 DIAGNOSIS — M19012 Primary osteoarthritis, left shoulder: Secondary | ICD-10-CM

## 2021-08-04 DIAGNOSIS — M7522 Bicipital tendinitis, left shoulder: Secondary | ICD-10-CM

## 2021-08-04 DIAGNOSIS — G8929 Other chronic pain: Secondary | ICD-10-CM

## 2021-08-04 NOTE — Progress Notes (Signed)
Patient: Wanda [name]  Service Category: E/M  Provider: Gaspar Cola, MD  DOB: 05-Feb-1956  DOS: 08/04/2021  Location: Office  MRN: [id number]  Setting: Ambulatory outpatient  Referring Provider: Barbaraann Boys, MD  Type: Established Patient  Specialty: Interventional Pain Management  PCP: Barbaraann Boys, MD  Location: Remote location  Delivery: TeleHealth     Virtual Encounter - Pain Management PROVIDER NOTE: Information contained herein reflects review and annotations entered in association with encounter. Interpretation of such information and data should be left to medically-trained personnel. Information provided to patient can be located elsewhere in the medical record under "Patient Instructions". Document created using STT-dictation technology, any transcriptional errors that may result from process are unintentional.    Contact & Pharmacy Preferred: [phone] Home: [phone] (home) Mobile: [phone] (mobile) E-mail: [email or web address]  [facility] - [place], [place] - [address] [place] [address] Phone: [phone] Fax: [phone]  [facility] [place], [place] - [address] [place] [address] Phone: [phone] Fax: [phone]  [facility] [place], [place] - [address] [facility] [address] [facility] [place] [address] Phone: [phone] Fax: [phone]   Pre-screening  Wanda [name] offered "in-person" vs "virtual" encounter. She indicated preferring virtual for this encounter.   Reason COVID-19*   Social distancing based on CDC and AMA recommendations.   I contacted Wanda [name] on 08/04/2021 via telephone.      I clearly identified myself as Gaspar Cola, MD. I verified that I was speaking with the correct person using two identifiers (Name: SHARLON PFOHL, and date of birth:  [birth year]).  Consent I sought verbal advanced consent from Wanda [name] for virtual visit interactions. I informed Wanda [name] of possible security and privacy concerns, risks, and limitations associated with providing "not-in-person" medical evaluation and management services. I also informed Wanda [name] of the availability of "in-person" appointments. Finally, I informed her that there would be a charge for the virtual visit and that she could be  personally, fully or partially, financially responsible for it. Wanda [name] expressed understanding and agreed to proceed.   Historic Elements   Wanda [name] is a 66 y.o. year old, female patient evaluated today after our last contact on 07/21/2021. 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 She also  has a past surgical history that includes Shoulder surgery (Right); Appendectomy; Abdominal hysterectomy; Back surgery; Spinal cord stimulator; Colonoscopy (N/A, 08/28/2013); Intramedullary (im) nail intertrochanteric (Left, 05/30/2016); Spinal cord stimulator removal (N/A, 01/12/2020); Breast excisional biopsy (Right); Endoscopic retrograde cholangiopancreatography (ercp) with propofol (N/A, 09/20/2020); and Cholecystectomy (09/2020). Wanda [name] has a current medication list which includes the following prescription(s): alendronate,  atorvastatin, vitamin d3, dexlansoprazole, famotidine, hydroxyzine, metoprolol succinate, metoprolol succinate, multivitamin, naproxen, potassium chloride sa, and vilazodone hcl. She  reports that she quit smoking about 31 years ago. Her smoking use included cigarettes. She has a 3.50 pack-year smoking history. She has never used smokeless tobacco.  She reports current alcohol use. She reports that she does not use drugs. Wanda [name] is allergic to levofloxacin, tramadol, pantoprazole, codeine, mobic [meloxicam], and propoxyphene hcl.   HPI  Today, she is being contacted for a post-procedure assessment.  The patient indicates that after her left shoulder injection, she did not get any relief of the shoulder pain for the duration of the local anesthetic.  Furthermore she indicates that it was not until about 3 days later that she started having the improvement of her pain which eventually got to the current ongoing 95-98% benefit that she is enjoying right now.  The fact that she did not get any relief of the pain for the duration of the local anesthetic tells me that the shoulder was not the source of her pain that perhaps the source is more proximal.  Today I have reviewed her imaging and the only thing that we have available of her cervical spine is an x-ray done on 01/04/2017 that demonstrated osteophytic changes noted from C4-C7.  In addition there are disc space narrowing at C5-6 and C6-7 with mild neural foraminal narrowing seen at the C5-6 and C6-7 levels.  Today I asked if she was experiencing pain in the area of the neck which she confirmed and she also confirmed that when it comes to the neck, she has more pain on the left side than the right.  This would suggest that her shoulder pain may actually be radicular.  Since she is currently doing well, we will need to do anything else.  However, if the pain starts coming back and she is also having some neck pain associated with it, then I have informed the  patient that I would like to do an MRI of the cervical spine to evaluate the area at that time.  If the cervical MRI confirms her to be having foraminal stenosis towards the left side affecting the C4-C7 levels, then there may be prudent to consider this either the possibility of treating this with some diagnostic cervical epidural steroid injections.  This plan was shared with the patient who understood and accepted.  Post-procedure evaluation     Procedure:          Anesthesia, Analgesia, Anxiolysis:  Type: Diagnostic Glenohumeral and acromioclavicular joint Injection #1  Primary Purpose: Diagnostic Region: Superior Shoulder Area Level:  Shoulder Target Area: Glenohumeral and acromioclavicular joint Approach: Anterior approach. Laterality: Left  Anesthesia: Local (1-2% Lidocaine)  Anxiolysis: None  Sedation: None  Guidance: Fluoroscopy           Position: Supine   1. Biceps tendinitis of shoulder (Left)   2. Arthropathy of shoulder (Left)   3. Chronic shoulder pain (Left)   4. Osteoarthritis of AC (acromioclavicular) joint (Left)   5. Osteoarthritis of glenohumeral joint (Left)    NAS-11 Pain score:   Pre-procedure: 8 /10   Post-procedure: 7 /10     Effectiveness:  Initial hour after procedure: 0 %. Subsequent 4-6 hours post-procedure: 0 %. Analgesia past initial 6 hours: 95 % (reports that the shoulder hurt at the same severity x 3 days.  after that the pain began to go away and now the pain is intermittent.  for the most part is a lot better.). Ongoing improvement:  Analgesic: The patient indicates currently having an ongoing 95-98% improvement of her left shoulder pain. Function: Wanda [name] reports improvement in function ROM: Wanda [name] reports improvement in ROM  Pharmacotherapy Assessment   Opioid Analgesic: None MME/day: 0 mg/day  Monitoring: [place] PMP: PDMP reviewed during this encounter.       Pharmacotherapy: No side-effects or adverse reactions  reported. Compliance: No problems identified. Effectiveness: Clinically acceptable. Plan: Refer to "POC". UDS: No results found for: SUMMARY   Laboratory Chemistry Profile   Renal Lab Results  Component Value Date   BUN 5 (L) 09/21/2020   CREATININE 0.58 09/21/2020   BCR 14 07/14/2020   GFRAA 110 07/14/2020   GFRNONAA >60 09/21/2020    Hepatic Lab Results  Component Value Date   AST 31 09/21/2020   ALT 86 (H) 09/21/2020   ALBUMIN 3.1 (L) 09/21/2020   ALKPHOS 130 (H) 09/21/2020   LIPASE 27 09/18/2020    Electrolytes Lab Results  Component Value Date   NA 139 09/21/2020   K 3.9 09/21/2020   CL 108 09/21/2020   CALCIUM 9.0 09/21/2020   MG 1.8 09/20/2020    Bone Lab Results  Component Value Date   25OHVITD1 46 07/16/2017   25OHVITD2 <1.0 07/16/2017   25OHVITD3 46 07/16/2017    Inflammation (CRP: Acute Phase) (ESR: Chronic Phase) Lab Results  Component Value Date   CRP 1 07/14/2020   ESRSEDRATE 3 07/14/2020         Note: Above Lab results reviewed.  Imaging  DG PAIN CLINIC C-ARM 1-60 MIN NO REPORT Fluoro was used, but no Radiologist interpretation will be provided.  Please refer to "NOTES" tab for provider progress note.  Assessment  The primary encounter diagnosis was Chronic shoulder pain (Left). Diagnoses of Biceps tendinitis of shoulder (Left), Arthropathy of shoulder (Left), Osteoarthritis of AC (acromioclavicular) joint (Left), Chronic neck pain, and Chronic pain syndrome were also pertinent to this visit.  Plan of Care  Problem-specific:  No problem-specific Assessment & Plan notes found for this encounter.  Wanda [name] has a current medication list which includes the following long-term medication(s): atorvastatin, dexlansoprazole, famotidine, metoprolol succinate, metoprolol succinate, potassium chloride sa, and vilazodone hcl.  Pharmacotherapy (Medications Ordered): No orders of the defined types were placed in this encounter.  Orders:   No orders of the defined types were placed in this encounter.  Follow-up plan:   No follow-ups on file.     Interventional Therapies  Risk   Complexity Considerations:   Estimated body mass index is 27.96 kg/m as calculated from the following:   Height as of this encounter: $RemoveBeforeD'5\' 1"'[id number]$  (1.549 m).   Weight as of this encounter: 148 lb (67.1 kg). WNL   Planned   Pending:   Diagnostic MRI of the cervical spine (possible source of the shoulder pain)   Under consideration:   Diagnostic left cervical ESI #1  Diagnostic left cervical facet MBB #1  Possible bilateral lumbar facet RFA, starting with the left side. Diagnostic (Midline) L1-2 LESI     Completed:   Diagnostic left IA shoulder joint injection (AC & GH) x1 (07/21/2021) (0/0/95) (see 08/04/2021 note) Medrol Dosepak for left shoulder pain (06/06/2021) (95%)  Implant of Lumbar spinal cord stimulator (12/13/2012) (Dr. Dossie Arbour) (referral by Dr. Neomia Dear)  Removal of nonfunctional spinal cord stimulator (01/12/2020) (Dr. Lacinda Axon)  Diagnostic bilateral lumbar facet MBB x3 (06/28/2017) (100/100/70/75) (100/100/85/<50) (50/50/80/50)  Diagnostic left lumbar facet MBB x1 (02/20/2017) (100/100/100/100)  Diagnostic/therapeutic right ES + QL muscle MNB x1 (07/24/2017) (100/100)    Therapeutic   Palliative (PRN) options:   Therapeutic/palliative lumbar facet MBB     Recent Visits Date Type Provider Dept  07/21/21 Procedure visit Milinda Pointer, [facility]  07/18/21 Office  Visit Milinda Pointer, MD [facility]  06/20/21 Office Visit Milinda Pointer, MD [facility]  06/06/21 Office Visit Milinda Pointer, MD [facility]  Showing recent visits within past 90 days and meeting all other requirements Today's Visits Date Type Provider Dept  08/04/21 Office Visit Milinda Pointer, MD [facility]  Showing today's visits and meeting all other requirements Future Appointments No  visits were found meeting these conditions. Showing future appointments within next 90 days and meeting all other requirements  I discussed the assessment and treatment plan with the patient. The patient was provided an opportunity to ask questions and all were answered. The patient agreed with the plan and demonstrated an understanding of the instructions.  Patient advised to call back or seek an in-person evaluation if the symptoms or condition worsens.  Duration of encounter: 35 minutes.  Note by: Gaspar Cola, MD Date: 08/04/2021; Time: 3:46 PM

## 2021-08-05 DIAGNOSIS — I700 Atherosclerosis of aorta: Secondary | ICD-10-CM | POA: Insufficient documentation

## 2021-09-12 ENCOUNTER — Ambulatory Visit (INDEPENDENT_AMBULATORY_CARE_PROVIDER_SITE_OTHER): Payer: Medicare Other | Admitting: Licensed Clinical Social Worker

## 2021-09-12 DIAGNOSIS — F3341 Major depressive disorder, recurrent, in partial remission: Secondary | ICD-10-CM | POA: Diagnosis not present

## 2021-09-12 DIAGNOSIS — Z634 Disappearance and death of family member: Secondary | ICD-10-CM | POA: Diagnosis not present

## 2021-09-12 DIAGNOSIS — F411 Generalized anxiety disorder: Secondary | ICD-10-CM | POA: Diagnosis not present

## 2021-09-12 NOTE — Plan of Care (Signed)
?  Problem: Depression CCP Problem  1 Decrease depressive symptoms and improve levels of effective functioning-pt reports a decrease in overall depression symptoms 3 out of 5 sessions documented.  ?Goal: LTG: Reduce frequency, intensity, and duration of depression symptoms as evidenced by: SSB input needed on appropriate metric ?Outcome: Not Progressing ?Note: Pt reports sadness over finding out that sister is going into Hospice care ?Goal: STG: Anaya WILL PARTICIPATE IN AT LEAST 80% OF SCHEDULED INDIVIDUAL PSYCHOTHERAPY SESSIONS ?Outcome: Progressing ?  ?Problem: Anxiety Disorder CCP Problem  1 Reduce overall frequency, intensity, and duration of the anxiety so that daily functioning is not impaired per pt self report 3 out of 5 sessions documented.   ?Goal: LTG: Patient will score less than 5 on the Generalized Anxiety Disorder 7 Scale (GAD-7) ?Outcome: Not Progressing ?  ?

## 2021-09-12 NOTE — Progress Notes (Signed)
Virtual Visit via Audio Note ? ?I connected with Wanda [name] on 09/12/21 at 11:00 AM EDT by an audio enabled telemedicine application and verified that I am speaking with the correct person using two identifiers. ? ?Location: ?Patient: home ?Provider: remote office [place], [place]) ?  ?I discussed the limitations of evaluation and management by telemedicine and the availability of in person appointments. The patient expressed understanding and agreed to proceed. ?  ?I discussed the assessment and treatment plan with the patient. The patient was provided an opportunity to ask questions and all were answered. The patient agreed with the plan and demonstrated an understanding of the instructions. ?  ?The patient was advised to call back or seek an in-person evaluation if the symptoms worsen or if the condition fails to improve as anticipated. ? ?I provided 15 minutes of non-face-to-face time during this encounter. ? ? ?Wanda Glynn R Mayela Bullard, Wanda [name] ? ? ?THERAPIST PROGRESS NOTE ? ?Session Time: 11-1115a ? ?Participation Level: Active ? ?Behavioral Response: NAAlertDepressed ? ?Type of Therapy: Family Therapy ? ?Treatment Goals addressed: Problem: Depression CCP Problem  1 Decrease depressive symptoms and improve levels of effective functioning-pt reports a decrease in overall depression symptoms 3 out of 5 sessions documented.  ?Goal: LTG: Reduce frequency, intensity, and duration of depression symptoms as evidenced by: SSB input needed on appropriate metric ?Outcome: Not Progressing ?Note: Pt reports sadness over finding out that sister is going into Hospice care ?Goal: STG: Wanda [name] WILL PARTICIPATE IN AT LEAST 80% OF SCHEDULED INDIVIDUAL PSYCHOTHERAPY SESSIONS ?Outcome: Progressing ?  ?Problem: Anxiety Disorder CCP Problem  1 Reduce overall frequency, intensity, and duration of the anxiety so that daily functioning is not impaired per pt self report 3 out of 5 sessions documented.   ?Goal: LTG: Patient will score less  than 5 on the Generalized Anxiety Disorder 7 Scale (GAD-7) ?Outcome: Not Progressing ? ?ProgressTowards Goals: Progressing ? ?Interventions: CBT, Solution Focused, and Other: grief counseling ? ?Summary: AMOUR TRIGG is a 66 y.o. female who presents with continuing symptoms related to depression, anxiety, and bereavement.  ? ?Allowed pt to explore and express thoughts and feelings associated with recent life situations and external stressors. Explored pts feelings associated with the recent knowledge that her sister needs to go into Hospice care. Offered pt unconditional positive support. ? ?Continued recommendations are as follows: self care behaviors, positive social engagements, focusing on overall work/home/life balance, and focusing on positive physical and emotional wellness.  ? ?Suicidal/Homicidal: No ? ?Therapist Response: Pt is continuing to apply interventions learned in session into daily life situations. Pt is currently on track to meet goals utilizing interventions mentioned above. Personal growth and progress noted. Treatment to continue as indicated.  ? ?Plan: Return again in 4 weeks. ? ?Diagnosis: MDD (major depressive disorder), recurrent, in partial remission ([place]) ? ?GAD (generalized anxiety disorder) ? ?Bereavement ? ?Collaboration of Care: Other Pt recommended to  ? ?Patient/Guardian was advised Release of Information must be obtained prior to any record release in order to collaborate their care with an outside provider. Patient/Guardian was advised if they have not already done so to contact the registration department to sign all necessary forms in order for [place] to release information regarding their care.  ? ?Consent: Patient/Guardian gives verbal consent for treatment and assignment of benefits for services provided during this visit. Patient/Guardian expressed understanding and agreed to proceed.  ? ?Sherilee Smotherman R Shantese Raven, Wanda [name] ?09/12/2021 ? ?

## 2021-09-28 ENCOUNTER — Ambulatory Visit (INDEPENDENT_AMBULATORY_CARE_PROVIDER_SITE_OTHER): Payer: Medicare Other | Admitting: Psychiatry

## 2021-09-28 ENCOUNTER — Encounter: Payer: Self-pay | Admitting: Psychiatry

## 2021-09-28 VITALS — Temp 98.5°F | Wt 151.8 lb

## 2021-09-28 DIAGNOSIS — F5101 Primary insomnia: Secondary | ICD-10-CM

## 2021-09-28 DIAGNOSIS — F3342 Major depressive disorder, recurrent, in full remission: Secondary | ICD-10-CM

## 2021-09-28 DIAGNOSIS — F411 Generalized anxiety disorder: Secondary | ICD-10-CM | POA: Diagnosis not present

## 2021-09-28 DIAGNOSIS — F4010 Social phobia, unspecified: Secondary | ICD-10-CM | POA: Diagnosis not present

## 2021-09-28 MED ORDER — HYDROXYZINE PAMOATE 25 MG PO CAPS: 25.0000 mg | ORAL_CAPSULE | Freq: Two times a day (BID) | ORAL | 2 refills | Status: DC | PRN

## 2021-09-28 NOTE — Progress Notes (Signed)
[facility] OP Progress Note ? ?09/28/2021 3:00 PM ?Wanda [name]  ?MRN:  [id number] ? ?Chief Complaint:  ?Chief Complaint  ?Patient presents with  ? Follow-up: 66 year old Caucasian female with history of MDD, GAD, social anxiety, presented for medication management.  ? ?HPI: Wanda [name] is a 66 year old Caucasian female, widowed, on SSI, lives in [place], has a history of GAD, MDD, social anxiety disorder, SVT, orthostatic hypotension, hyperlipidemia was evaluated in office today. ? ?Patient today reports overall she is making progress.  She has been coping better with regards to her grief. ? ?Patient reports she does worry about her grandson who is currently incarcerated.  He does struggle with substance abuse and refuses to get help.  Patient reports she is relieved that he is currently in a secure place and is hopeful that he will try to get better and pursue treatment. ? ?Patient continues to have sleep problems.  Patient however reports she currently takes melatonin and magnesium and wants to continue to work on her sleep hygiene.  She is not interested in adding a new sleep medication yet. ? ?Patient is compliant on the Viibryd.  Denies side effects. ? ?Patient denies any suicidality, homicidality or perceptual disturbances. ? ?Patient denies any other concerns today. ? ?Visit Diagnosis:  ?  ICD-10-CM   ?1. GAD (generalized anxiety disorder)  F41.1 hydrOXYzine (VISTARIL) 25 MG capsule  ?  ?2. MDD (major depressive disorder), recurrent, in full remission ([place])  F33.42   ?  ?3. Social anxiety disorder  F40.10 hydrOXYzine (VISTARIL) 25 MG capsule  ?  ?4. Primary insomnia  F51.01   ?  ? ? ?Past Psychiatric History: Reviewed past psychiatric history from progress note on 08/21/2017.  Past trials of Zoloft, Cymbalta, Effexor, Paxil, Prozac, Wellbutrin, prazosin, Seroquel, trazodone, doxepin, temazepam. ? ?Past Medical History:  ?Past Medical History:  ?Diagnosis Date  ? Anxiety   ? Basal cell carcinoma 05/03/2016  ?  Left deltoid. Superficial.  ? Basal cell carcinoma 05/03/2016  ? Left distal lat. tricep. Nodular.  ? Borderline diabetes   ? BRONCHITIS, ACUTE WITH BRONCHOSPASM 06/25/2010  ? Qualifier: Diagnosis of  By: Alveta Heimlich MD, Cornelia Copa    ? Chronic prescription benzodiazepine use 10/18/2015  ? Depression   ? Dysrhythmia   ? tachycardia  ? GASTROENTERITIS WITHOUT DEHYDRATION 05/14/2009  ? Qualifier: Diagnosis of  By: Deborra Medina MD, [place]    ? GERD (gastroesophageal reflux disease)   ? Herpes   ? Hip fx, left, closed, with nonunion, subsequent encounter 05/29/2016  ? History of kidney stones   ? Hyperlipidemia   ? Hypertension   ? Irregular heart beat   ? Pre-diabetes   ? Sinus tachycardia 04/11/2017  ? Skin cancer of face 08/01/2018  ? 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 Implant date: 12/12/2012  Implanting surgeon: Dr. Dossie Arbour Serial number: [id number] Model number: [id number]   ?  ?Past Surgical History:  ?Procedure Laterality Date  ? ABDOMINAL HYSTERECTOMY    ? APPENDECTOMY    ? BACK SURGERY    ? X 3  ? BREAST EXCISIONAL BIOPSY Right   ? surgical bx age 24   ? CHOLECYSTECTOMY  09/2020  ? COLONOSCOPY N/A 08/28/2013  ? Procedure: COLONOSCOPY;  Surgeon: Danie Binder, MD;  [facility];  Service: Endoscopy;  Laterality: N/A;  8:30 AM  ? ENDOSCOPIC RETROGRADE CHOLANGIOPANCREATOGRAPHY (ERCP) WITH PROPOFOL N/A 09/20/2020  ? Procedure: ENDOSCOPIC RETROGRADE CHOLANGIOPANCREATOGRAPHY (ERCP) WITH PROPOFOL;  Surgeon: Lucilla Lame, MD;  Location:  [facility];  Service: Endoscopy;  Laterality: N/A;  ? INTRAMEDULLARY (IM) NAIL INTERTROCHANTERIC Left 05/30/2016  ? Procedure: INTRAMEDULLARY (IM) NAIL INTERTROCHANTRIC;  Surgeon: Thornton Park, MD;  Location: [facility];  Service: Orthopedics;  Laterality: Left;  ? SHOULDER SURGERY Right   ? Spinal cord stimulator    ? SPINAL CORD STIMULATOR REMOVAL N/A 01/12/2020  ? Procedure: REMOVAL SPINAL CORD STIMULATOR AND PULSE GENERATOR;  Surgeon: Deetta Perla, MD;  Location:  [facility];  Service: Neurosurgery;  Laterality: N/A;  Local w/ MAC  ? ? ?Family Psychiatric History: Reviewed family psychiatric history from progress note on 08/21/2017. ? ?Family History:  ?Family History  ?Problem Relation Age of Onset  ? Diabetes Mother   ? COPD Mother   ? Heart disease Mother   ? Diabetes Father   ? COPD Father   ? Heart disease Father   ? Heart attack Father 4  ?     CABG  ? Alcohol abuse Daughter   ? Depression Daughter   ? Drug abuse Daughter   ? Anxiety disorder Daughter   ? Tuberculosis Maternal Aunt   ? Cancer Paternal Aunt   ?     breast  ? Breast cancer Paternal Aunt   ? Diabetes Paternal Grandmother   ? Diabetes Sister   ? Cancer Cousin   ? Diabetes Cousin   ? Colon cancer Neg Hx   ? Sudden Cardiac Death Neg Hx   ? ? ?Social History: Reviewed social history from progress note on 08/21/2017. ?Social History  ? ?Socioeconomic History  ? Marital status: Divorced  ?  Spouse name: Not on file  ? Number of children: 2  ? Years of education: Not on file  ? Highest education level: High school graduate  ?Occupational History  ?  Comment: disabled  ?Tobacco Use  ? Smoking status: Former  ?  Packs/day: 0.25  ?  Years: 14.00  ?  Pack years: 3.50  ?  Types: Cigarettes  ?  Quit date: 73  ?  Years since quitting: 31.3  ? Smokeless tobacco: Never  ?Vaping Use  ? Vaping Use: Never used  ?Substance and Sexual Activity  ? Alcohol use: Yes  ?  Comment: occasional; once every 6 months  ? Drug use: No  ? Sexual activity: Yes  ?  Birth control/protection: Surgical  ?Other Topics Concern  ? Not on file  ?Social History Narrative  ? Not on file  ? ?Social Determinants of Health  ? ?Financial Resource Strain: Not on file  ?Food Insecurity: Not on file  ?Transportation Needs: Not on file  ?Physical Activity: Not on file  ?Stress: Not on file  ?Social Connections: Not on file  ? ? ?Allergies:  ?Allergies  ?Allergen Reactions  ? Levofloxacin Hives and Nausea And Vomiting  ? Tramadol Other (See Comments)  ?   Severe headache  ? Pantoprazole Itching  ? Codeine Nausea Only  ?  Slight nausea  ? Mobic [Meloxicam] Other (See Comments)  ?  Calf tightness  ? Propoxyphene Hcl Rash  ? ? ?Metabolic Disorder Labs: ?Lab Results  ?Component Value Date  ? HGBA1C 5.4 05/29/2016  ? MPG 108 05/29/2016  ? ?No results found for: PROLACTIN ?Lab Results  ?Component Value Date  ? CHOL 164 12/02/2020  ? TRIG 262 (H) 12/02/2020  ? HDL 48 12/02/2020  ? CHOLHDL 3.4 12/02/2020  ? VLDL 59 (H) 09/29/2016  ? [place] 74 12/02/2020  ? [place] 78 07/08/2018  ? ?Lab Results  ?Component Value Date  ?  TSH 3.700 09/19/2020  ? TSH 0.932 07/14/2020  ? ? ?Therapeutic Level Labs: ?No results found for: LITHIUM ?No results found for: VALPROATE ?No components found for:  CBMZ ? ?Current Medications: ?Current Outpatient Medications  ?Medication Sig Dispense Refill  ? alendronate (FOSAMAX) 70 MG tablet Take by mouth.    ? atorvastatin (LIPITOR) 80 MG tablet TAKE 1 TABLET(80 MG) BY MOUTH DAILY IN THE EVENING 90 tablet 1  ? Cholecalciferol (VITAMIN D3) 5000 units TABS Take 5,000 Units by mouth daily.    ? dexlansoprazole (DEXILANT) 60 MG capsule Take 60 mg by mouth daily.     ? famotidine (PEPCID) 40 MG tablet Take by mouth.    ? metoprolol succinate (TOPROL XL) 50 MG 24 hr tablet Take 1 tablet (50 mg total) by mouth daily. Take with or immediately following a meal. 90 tablet 3  ? Multiple Vitamin (MULTIVITAMIN) tablet Take 1 tablet by mouth daily.    ? naproxen (NAPROSYN) 250 MG tablet Take by mouth 2 (two) times daily with a meal.    ? potassium chloride SA (KLOR-CON M) 20 MEQ tablet Take 1 tablet (20 mEq total) by mouth 2 (two) times daily. 180 tablet 0  ? Vilazodone HCl (VIIBRYD) 40 MG TABS TAKE 1 TABLET(40 MG) BY MOUTH DAILY 90 tablet 1  ? hydrOXYzine (VISTARIL) 25 MG capsule Take 1 capsule (25 mg total) by mouth 2 (two) times daily as needed for anxiety. 60 capsule 2  ? ?No current facility-administered medications for this visit.   ? ? ? ?Musculoskeletal: ?Strength & Muscle Tone: within normal limits ?Gait & Station: normal ?Patient leans: N/A ? ?Psychiatric Specialty Exam: ?Review of Systems  ?Psychiatric/Behavioral:  Positive for sleep disturbance.   ?All other s

## 2021-10-20 ENCOUNTER — Other Ambulatory Visit: Payer: Self-pay | Admitting: Medical

## 2021-10-25 ENCOUNTER — Telehealth: Payer: Self-pay | Admitting: Medical

## 2021-10-25 NOTE — Telephone Encounter (Signed)
*  STAT* If patient is at the pharmacy, call can be transferred to refill team.   1. Which medications need to be refilled? (please list name of each medication and dose if known) Potassium Chloride  2. Which pharmacy/location (including street and city if local pharmacy) is medication to be sent to?[facility]  [address]  3. Do they need a 30 day or 90 day supply? #180 and refills

## 2021-10-25 NOTE — Telephone Encounter (Signed)
RX for potassium 20 meq tablets sent to [facility] by our office on 10/20/21. Called the pharmacy and confirmed they received the RX but it may not be ready until tomorrow.  I have called the patient and notified her of the above. I have advised her to call the pharmacy or go by there tomorrow to see if her RX is ready.  The patient voices understanding and is agreeable. She was very appreciative of the call back.

## 2021-10-31 ENCOUNTER — Ambulatory Visit (INDEPENDENT_AMBULATORY_CARE_PROVIDER_SITE_OTHER): Payer: Medicare Other | Admitting: Licensed Clinical Social Worker

## 2021-10-31 DIAGNOSIS — Z634 Disappearance and death of family member: Secondary | ICD-10-CM | POA: Diagnosis not present

## 2021-10-31 DIAGNOSIS — F411 Generalized anxiety disorder: Secondary | ICD-10-CM | POA: Diagnosis not present

## 2021-10-31 DIAGNOSIS — F3342 Major depressive disorder, recurrent, in full remission: Secondary | ICD-10-CM

## 2021-10-31 NOTE — Plan of Care (Signed)
  Problem: Depression CCP Problem  1 Decrease depressive symptoms and improve levels of effective functioning-pt reports a decrease in overall depression symptoms 3 out of 5 sessions documented.  Goal: LTG: Reduce frequency, intensity, and duration of depression symptoms as evidenced by: SSB input needed on appropriate metric Outcome: Progressing Goal: STG: Wanda [name] WILL PARTICIPATE IN AT LEAST 80% OF SCHEDULED INDIVIDUAL PSYCHOTHERAPY SESSIONS Outcome: Progressing Intervention: Encourage verbalization of feelings/concerns/expectations Intervention: Encourage compliance with prescribed medication regimen Intervention: Encourage new environment or opportunities for social interaction Intervention: REVIEW PLEASE SKILLS (TREAT PHYSICAL ILLNESS, BALANCE EATING, AVOID MOOD-ALTERING SUBSTANCES, BALANCE SLEEP AND GET EXERCISE) WITH Wanda [name] Intervention: Provide grief and bereavement support   Problem: Anxiety Disorder CCP Problem  1 Reduce overall frequency, intensity, and duration of the anxiety so that daily functioning is not impaired per pt self report 3 out of 5 sessions documented.   Goal: LTG: Patient will score less than 5 on the Generalized Anxiety Disorder 7 Scale (GAD-7) Outcome: Progressing Intervention: Assist with relaxation techniques, as appropriate (deep breathing exercises, meditation, guided imagery) Intervention: Assess emotional status and coping mechanisms Intervention: Encourage self-care activities

## 2021-10-31 NOTE — Progress Notes (Signed)
Virtual Visit via Audio Note  I connected with Wanda [name] on 10/31/21 at 10:00 AM EDT by an audio enabled telemedicine application and verified that I am speaking with the correct person using two identifiers.  Location: Patient: home Provider: remote office [place], [place])   I discussed the limitations of evaluation and management by telemedicine and the availability of in person appointments. The patient expressed understanding and agreed to proceed.   I discussed the assessment and treatment plan with the patient. The patient was provided an opportunity to ask questions and all were answered. The patient agreed with the plan and demonstrated an understanding of the instructions.   The patient was advised to call back or seek an in-person evaluation if the symptoms worsen or if the condition fails to improve as anticipated.  I provided 45 minutes of non-face-to-face time during this encounter.   [place], LCSW   THERAPIST PROGRESS NOTE  Session Time:  [id number]  Participation Level: Active  Behavioral Response: NAAlertDepressed  Type of Therapy: Family Therapy  Treatment Goals addressed: Problem: Depression CCP Problem  1 Decrease depressive symptoms and improve levels of effective functioning-pt reports a decrease in overall depression symptoms 3 out of 5 sessions documented.  Goal: LTG: Reduce frequency, intensity, and duration of depression symptoms as evidenced by: SSB input needed on appropriate metric Outcome: Progressing Goal: STG: Sandrine WILL PARTICIPATE IN AT LEAST 80% OF SCHEDULED INDIVIDUAL PSYCHOTHERAPY SESSIONS Outcome: Progressing Intervention: Encourage verbalization of feelings/concerns/expectations Intervention: Encourage compliance with prescribed medication regimen Intervention: Encourage new environment or opportunities for social interaction Intervention: REVIEW PLEASE SKILLS (TREAT PHYSICAL ILLNESS, BALANCE EATING, AVOID MOOD-ALTERING  SUBSTANCES, BALANCE SLEEP AND GET EXERCISE) WITH [place] Intervention: Provide grief and bereavement support   Problem: Anxiety Disorder CCP Problem  1 Reduce overall frequency, intensity, and duration of the anxiety so that daily functioning is not impaired per pt self report 3 out of 5 sessions documented.   Goal: LTG: Patient will score less than 5 on the Generalized Anxiety Disorder 7 Scale (GAD-7) Outcome: Progressing Intervention: Assist with relaxation techniques, as appropriate (deep breathing exercises, meditation, guided imagery) Intervention: Assess emotional status and coping mechanisms Intervention: Encourage self-care activities    ProgressTowards Goals: Progressing  Interventions: CBT, Solution Focused, and Other: grief counseling  Summary: Wanda [name] is a 66 y.o. female who presents with continuing symptoms related to depression, anxiety, and bereavement.   Allowed pt to explore and express thoughts and feelings associated with recent life situations and external stressors. Patient reports that she is experiencing stress associated with recent loss of her sister. Patient reports that she was expecting the loss due to her sisters rapid progression of pancreatic cancer. Patient recalls that her sister had been under Hospice care since her last session. Patient reports that estranged family members were notified about patient sister, and they had an opportunity to see her prior to the loss.  Explore patient's thoughts and feelings about granddaughter, and how her son-in-law is continuing to be a barrier in patient and her granddaughters relationship. Explored patient's thoughts and feelings associated with this, and discussed steps moving forward. Patient feels she's at a point where she just wants to let everything go, and her son gave her the advice to not talk to her granddaughter or send a card for her birthday, and maybe that will elicit a response in her to reach out to her  grandmother. Patient reports "it's just too hard for me anymore, I'm so tired of my feelings  getting hurt". Discuss the need for setting limits and setting boundaries to protect patience feelings. Reviewed communication skills with adult family members, and letting go of certain expectations that individuals will change. Review concept of unconditional acceptance and moving forward from that.  Patient reports that she is being intentional about attending church, attending church groups, and associating with individuals outside of herself. Patient reports that she feels good when she socially engages with others.  Encouraged lots of self care as pt goes through the process of grieving her sister--she was close with her so pt reports this is a tough loss.   Pt does report that overall she feels "stable".   Continued recommendations are as follows: self care behaviors, positive social engagements, focusing on overall work/home/life balance, and focusing on positive physical and emotional wellness.   Suicidal/Homicidal: No  Therapist Response: Pt is continuing to apply interventions learned in session into daily life situations. Pt is currently on track to meet goals utilizing interventions mentioned above. Personal growth and progress noted. Treatment to continue as indicated.   Plan: Return again in 4 weeks.  Diagnosis: MDD (major depressive disorder), recurrent, in full remission ([place])  Bereavement  GAD (generalized anxiety disorder)  Collaboration of Care: Other Pt recommended to   Patient/Guardian was advised Release of Information must be obtained prior to any record release in order to collaborate their care with an outside provider. Patient/Guardian was advised if they have not already done so to contact the registration department to sign all necessary forms in order for [place] to release information regarding their care.   Consent: Patient/Guardian gives verbal consent for treatment and  assignment of benefits for services provided during this visit. Patient/Guardian expressed understanding and agreed to proceed.   [place], LCSW 10/31/2021

## 2021-11-12 ENCOUNTER — Other Ambulatory Visit: Payer: Self-pay | Admitting: Medical

## 2021-12-09 ENCOUNTER — Other Ambulatory Visit: Payer: Self-pay | Admitting: Psychiatry

## 2021-12-09 DIAGNOSIS — F3342 Major depressive disorder, recurrent, in full remission: Secondary | ICD-10-CM

## 2021-12-19 ENCOUNTER — Ambulatory Visit (INDEPENDENT_AMBULATORY_CARE_PROVIDER_SITE_OTHER): Payer: Medicare Other | Admitting: Licensed Clinical Social Worker

## 2021-12-19 ENCOUNTER — Telehealth: Payer: Self-pay

## 2021-12-19 DIAGNOSIS — F411 Generalized anxiety disorder: Secondary | ICD-10-CM

## 2021-12-19 DIAGNOSIS — Z634 Disappearance and death of family member: Secondary | ICD-10-CM | POA: Diagnosis not present

## 2021-12-19 DIAGNOSIS — F3342 Major depressive disorder, recurrent, in full remission: Secondary | ICD-10-CM

## 2021-12-19 DIAGNOSIS — F4010 Social phobia, unspecified: Secondary | ICD-10-CM

## 2021-12-19 MED ORDER — HYDROXYZINE PAMOATE 25 MG PO CAPS: 25.0000 mg | ORAL_CAPSULE | Freq: Two times a day (BID) | ORAL | 3 refills | Status: DC | PRN

## 2021-12-19 MED ORDER — VILAZODONE HCL 40 MG PO TABS: 40.0000 mg | ORAL_TABLET | Freq: Every day | ORAL | 0 refills | Status: DC

## 2021-12-19 NOTE — Telephone Encounter (Signed)
I have sent Viibryd and hydroxyzine to [facility].

## 2021-12-19 NOTE — Progress Notes (Signed)
Virtual Visit via Audio Note  I connected with Wanda [name] on 12/19/21 at 11:00 AM EDT by an audio enabled telemedicine application and verified that I am speaking with the correct person using two identifiers.  Location: Patient: home Provider: remote office [place] [name], [place])   I discussed the limitations of evaluation and management by telemedicine and the availability of in person appointments. The patient expressed understanding and agreed to proceed.   I discussed the assessment and treatment plan with the patient. The patient was provided an opportunity to ask questions and all were answered. The patient agreed with the plan and demonstrated an understanding of the instructions.   The patient was advised to call back or seek an in-person evaluation if the symptoms worsen or if the condition fails to improve as anticipated.  I provided 45 minutes of non-face-to-face time during this encounter.   [place], LCSW   THERAPIST PROGRESS NOTE  Session Time:  [id number]  Participation Level: Active  Behavioral Response: NAAlertDepressed  Type of Therapy: Family Therapy  Treatment Goals addressed: Problem: Depression CCP Problem  1 Decrease depressive symptoms and improve levels of effective functioning-pt reports a decrease in overall depression symptoms 3 out of 5 sessions documented.  Goal: LTG: Reduce frequency, intensity, and duration of depression symptoms as evidenced by: SSB input needed on appropriate metric Outcome: Progressing Goal: STG: Wanda [name] WILL PARTICIPATE IN AT LEAST 80% OF SCHEDULED INDIVIDUAL PSYCHOTHERAPY SESSIONS Outcome: Progressing Intervention: Encourage verbalization of feelings/concerns/expectations Intervention: Encourage compliance with prescribed medication regimen Intervention: Encourage new environment or opportunities for social interaction Intervention: REVIEW PLEASE SKILLS (TREAT PHYSICAL ILLNESS, BALANCE EATING, AVOID MOOD-ALTERING  SUBSTANCES, BALANCE SLEEP AND GET EXERCISE) WITH Wanda [name] Intervention: Provide grief and bereavement support   Problem: Anxiety Disorder CCP Problem  1 Reduce overall frequency, intensity, and duration of the anxiety so that daily functioning is not impaired per pt self report 3 out of 5 sessions documented.   Goal: LTG: Patient will score less than 5 on the Generalized Anxiety Disorder 7 Scale (GAD-7) Outcome: Progressing Intervention: Assist with relaxation techniques, as appropriate (deep breathing exercises, meditation, guided imagery) Intervention: Assess emotional status and coping mechanisms Intervention: Encourage self-care activities    ProgressTowards Goals: Progressing  Interventions: CBT, Solution Focused, and Other: grief counseling  Summary: Wanda [name] is a 66 y.o. female who presents with continuing symptoms related to depression, anxiety, and bereavement.   Allowed pt to explore and express thoughts and feelings associated with recent life situations and external stressors. Discussed relationships with family members: sons, DIL (health related concerns/[place]).  Pt states that she hears from Wanda [name] every now and then--states that Wanda [name] does have a telephone that she can use anytime she wants.   Discussed health/sleep--pt feels good health wise and is getting better sleep quality and quantity after taking melatonin and magnesium.   Pt does report that overall she feels "stable".   Continued recommendations are as follows: self care behaviors, positive social engagements, focusing on overall work/home/life balance, and focusing on positive physical and emotional wellness.   Suicidal/Homicidal: No  Therapist Response: Pt is continuing to apply interventions learned in session into daily life situations. Pt is currently on track to meet goals utilizing interventions mentioned above. Personal growth and progress noted. Treatment to continue as indicated.   Plan: Return again  in 4 weeks.  Diagnosis: MDD (major depressive disorder), recurrent, in full remission ([place]anda [name])  Bereavement  Collaboration of Care: Other Pt recommended to   Patient/Guardian was advised Release  of Information must be obtained prior to any record release in order to collaborate their care with an outside provider. Patient/Guardian was advised if they have not already done so to contact the registration department to sign all necessary forms in order for [place] to release information regarding their care.   Consent: Patient/Guardian gives verbal consent for treatment and assignment of benefits for services provided during this visit. Patient/Guardian expressed understanding and agreed to proceed.   [place], LCSW 12/19/2021

## 2021-12-19 NOTE — Telephone Encounter (Signed)
pt called states she changed pharmacy to [facility] and that she needs rx sent for the hydroxyzine and the viibryd  Pt last seen on 09-28-21 next appt 12-29-21

## 2021-12-19 NOTE — Telephone Encounter (Signed)
Dr. Eappen's patient

## 2021-12-26 ENCOUNTER — Encounter: Payer: Self-pay | Admitting: Medical

## 2021-12-26 ENCOUNTER — Ambulatory Visit: Payer: Medicare Other | Admitting: Medical

## 2021-12-26 VITALS — BP 118/78 | HR 81 | Ht 61.0 in | Wt 153.0 lb

## 2021-12-26 DIAGNOSIS — R000 Tachycardia, unspecified: Secondary | ICD-10-CM | POA: Diagnosis not present

## 2021-12-26 DIAGNOSIS — I471 Supraventricular tachycardia: Secondary | ICD-10-CM | POA: Diagnosis not present

## 2021-12-26 DIAGNOSIS — I6523 Occlusion and stenosis of bilateral carotid arteries: Secondary | ICD-10-CM

## 2021-12-26 DIAGNOSIS — R0789 Other chest pain: Secondary | ICD-10-CM | POA: Diagnosis not present

## 2021-12-26 DIAGNOSIS — Z8679 Personal history of other diseases of the circulatory system: Secondary | ICD-10-CM

## 2021-12-26 DIAGNOSIS — I10 Essential (primary) hypertension: Secondary | ICD-10-CM | POA: Diagnosis not present

## 2021-12-26 NOTE — Progress Notes (Signed)
Cardiology Office Note:    Date:  12/26/2021   ID:  Wanda, [name] Jan 17, 1956, MRN [id number]  PCP:  Wanda Boys, MD  [facility] Cardiologist:  Wanda Bush, MD  [facility] Electrophysiologist:  None   Referring MD: Wanda Boys, MD   Chief Complaint: 6 month follow-up  History of Present Illness:    Wanda [name] is a 66 y.o. female with a hx of orthostatic hypotension, sinus tachycardia, HLD, anxiety, depression who presents for 6 month follow-up.    Zio monitor, ordered for exertional dyspnea and palpitations, showed ST with rare ectopy, 4 runs of brief pSVT.    Seen back 09/02/20 and reported vague chest pain and racing heart beat.   Echo 10/28/20 showed EF 60-65% G1DD   Seen 10/29/20 and reported persistent tachypalpitations and anxiety. Recommended decrease caffeine intake and good fluid intake. She was started on propranolol, however did not tolerate this very well, and went back to metoprolol.    Last seen 06/30/21 and the patient reported she has been doing well. She reports palpitations and Toprol was increased to 29m daily.   Today, the patient reports she has been doing well. She reports improved palpations, hardly even notices. No chest pain or SOB. She has occasional lower leg edema, L>R. No orthopnea or pnd. No dizziness or lighthededness.   Past Medical History:  Diagnosis Date   Anxiety    Basal cell carcinoma 05/03/2016   Left deltoid. Superficial.   Basal cell carcinoma 05/03/2016   Left distal lat. tricep. Nodular.   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 NDossie ArbourSerial number: [id number] Model number: [id number]    Past Surgical History:  Procedure Laterality Date   ABDOMINAL HYSTERECTOMY     APPENDECTOMY     BACK SURGERY     X 3   BREAST EXCISIONAL BIOPSY Right    surgical bx age 66   CHOLECYSTECTOMY  09/2020   COLONOSCOPY N/A 08/28/2013   Procedure: COLONOSCOPY;  Surgeon: SDanie Binder MD;  [facility];  Service: Endoscopy;  Laterality: N/A;  8:30 AM   ENDOSCOPIC RETROGRADE CHOLANGIOPANCREATOGRAPHY (ERCP) WITH PROPOFOL N/A 09/20/2020   Procedure: ENDOSCOPIC RETROGRADE CHOLANGIOPANCREATOGRAPHY (ERCP) WITH PROPOFOL;  Surgeon: WLucilla Lame MD;  Location: [facility] ENDOSCOPY;  Service: Endoscopy;  Laterality: N/A;   INTRAMEDULLARY (IM) NAIL INTERTROCHANTERIC Left 05/30/2016   Procedure: INTRAMEDULLARY (IM) NAIL INTERTROCHANTRIC;  Surgeon: KThornton Park MD;  Location: [facility] ORS;  Service: Orthopedics;  Laterality: Left;   SHOULDER SURGERY Right    Spinal cord stimulator     SPINAL CORD STIMULATOR REMOVAL N/A 01/12/2020   Procedure: REMOVAL SPINAL CORD STIMULATOR AND PULSE GENERATOR;  Surgeon: CDeetta Perla MD;  Location: [facility] ORS;  Service: Neurosurgery;  Laterality: N/A;  Local w/ MAC    Current Medications: Current Meds  Medication Sig   alendronate (FOSAMAX) 70 MG tablet Take 70 mg by mouth once a week.   atorvastatin (LIPITOR) 80 MG tablet TAKE 1 TABLET(80 MG) BY MOUTH DAILY IN THE EVENING   Cholecalciferol (VITAMIN D3) 5000 units TABS Take 5,000 Units by mouth daily.   dexlansoprazole (DEXILANT) 60 MG capsule Take 60 mg by mouth daily.    famotidine (PEPCID) 40 MG tablet Take 40 mg by mouth at bedtime.   hydrOXYzine (VISTARIL) 25  MG capsule Take 1 capsule (25 mg total) by mouth 2 (two) times daily as needed for anxiety.   metoprolol succinate (TOPROL XL) 50 MG 24 hr tablet Take 1 tablet (50 mg total) by mouth daily. Take with or immediately following a meal.   Multiple Vitamin (MULTIVITAMIN) tablet Take 1 tablet by mouth daily.   naproxen (NAPROSYN) 250 MG tablet Take by mouth 2 (two) times daily with a meal.   potassium chloride SA (KLOR-CON M) 20 MEQ tablet TAKE 1 TABLET BY MOUTH TWICE DAILY   Vilazodone HCl (VIIBRYD) 40 MG TABS Take 1 tablet (40 mg total) by mouth daily.     Allergies:   Levofloxacin, Tramadol, Pantoprazole, Codeine, Mobic [meloxicam], and Propoxyphene hcl   Social History   Socioeconomic History   Marital status: Divorced    Spouse name: Not on file   Number of children: 2   Years of education: Not on file   Highest education level: High school graduate  Occupational History    Comment: disabled  Tobacco Use   Smoking status: Former    Packs/day: 0.25    Years: 14.00    Total pack years: 3.50    Types: Cigarettes    Quit date: 1992    Years since quitting: 31.6   Smokeless tobacco: Never  Vaping Use   Vaping Use: Never used  Substance and Sexual Activity   Alcohol use: Yes    Comment: occasional; once every 6 months   Drug use: No   Sexual activity: Yes    Birth control/protection: Surgical  Other Topics Concern   Not on file  Social History Narrative   Not on file   Social Determinants of Health   Financial Resource Strain: Medium Risk (06/25/2017)   Overall Financial Resource Strain (CARDIA)    Difficulty of Paying Living Expenses: Somewhat hard  Food Insecurity: No Food Insecurity (06/25/2017)   Hunger Vital Sign    Worried About Running Out of Food in the Last Year: Never true    [place] in the Last Year: Never true  Transportation Needs: No Transportation Needs (06/25/2017)   PRAPARE - Hydrologist (Medical): No    Lack of  Transportation (Non-Medical): No  Physical Activity: Inactive (06/25/2017)   Exercise Vital Sign    Days of Exercise per Week: 0 days    Minutes of Exercise per Session: 0 min  Stress: Stress Concern Present (06/25/2017)   [place]    Feeling of Stress : Very much  Social Connections: Somewhat Isolated (06/25/2017)   Social Connection and Isolation Panel [NHANES]    Frequency of Communication with Friends and Family: Twice a week    Frequency of Social Gatherings with Friends and Family: Never    Attends Religious Services: More than 4 times per year    Active Member of [employer] or Organizations: Yes  Attends Archivist Meetings: Never    Marital Status: Divorced     Family History: The patient's family history includes Alcohol abuse in her daughter; Anxiety disorder in her daughter; Breast cancer in her paternal aunt; COPD in her father and mother; Cancer in her cousin and paternal aunt; Depression in her daughter; Diabetes in her cousin, father, mother, paternal grandmother, and sister; Drug abuse in her daughter; Heart attack (age of onset: 75) in her father; Heart disease in her father and mother; Pancreatic cancer in her sister; Tuberculosis in her maternal aunt. There is no history of Colon cancer or Sudden Cardiac Death.  ROS:   Please see the history of present illness.     All other systems reviewed and are negative.  EKGs/Labs/Other Studies Reviewed:    The following studies were reviewed today:    Echo 10/28/20  1. Left ventricular ejection fraction, by estimation, is 60 to 65%. The  left ventricle has normal function. The left ventricle has no regional  wall motion abnormalities. Left ventricular diastolic parameters are  consistent with Grade I diastolic  dysfunction (impaired relaxation).   2. Right ventricular systolic function is normal. The right ventricular  size is normal. Tricuspid  regurgitation signal is inadequate for assessing  PA pressure.   08/18/20 Cardiac monitor The patient was monitored for 14 days. The predominant rhythm was sinus with an average rate of 93 bpm (range 55-180 bpm). There were rare PAC's and occasional PVC's (1.1% PVC burden). Four atrial runs lasting up to 23.3 seconds occurred with a maximum rate of 174 bpm. No sustained arrhythmia or prolonged pause was identified. Patient triggered event corresponds to sinus rhythm with isolated PVC. Predominantly sinus rhythm with rare PAC's and occasional PVC's.  PSVT also noted (see details above).   Echo 05/2016 - Procedure narrative: Transthoracic echocardiography. The study    was technically difficult.  - Left ventricle: Systolic function was normal. The estimated    ejection fraction was in the range of 60% to 65%.  - Aortic valve: Valve area (Vmax): 2.13 cm^2.   Long term monitor 05/2016 The patient was monitored for 13 days, 20 hours. The predominant rhythm was sinus with an average rate of 84 bpm (range 52-170 bpm). Rare PACs and atrial couplets were identified. Rare isolated PVCs were also seen. There were 5 episodes of supraventricular tachycardia lasting up to 10.8 seconds with a maximal rate of 152 bpm. There were no sustained arrhythmias or prolonged pauses. There were no patient triggered episodes. Predominantly sinus rhythm with rare PACs and PVCs, as well as paroxysmal SVT.   Carotids 2018 IMPRESSION: Mild bilateral carotid atherosclerotic vascular disease. No flow limiting stenosis. Degree of stenosis less than 50% bilaterally. 2. Vertebral arteries are patent with antegrade flow.    EKG:  EKG is ordered today.  The ekg ordered today demonstrates NSR, 81bpm PRI 27m, no significant changes  Recent Labs: No results found for requested labs within last 365 days.  Recent Lipid Panel    Component Value Date/Time   CHOL 164 12/02/2020 0917   TRIG 262 (H) 12/02/2020 0917   HDL  48 12/02/2020 0917   CHOLHDL 3.4 12/02/2020 0917   CHOLHDL 5.0 09/29/2016 0731   VLDL 59 (H) 09/29/2016 0731   LDLCALC 74 12/02/2020 0917   LDLDIRECT 197.3 02/01/2010 0907   Physical Exam:    VS:  BP 118/78 (BP Location: Left Arm, Patient Position: Sitting, Cuff Size: Normal)   Pulse 81   Ht _0  (  1.549 m)   Wt 153 lb (69.4 kg)   LMP 09/30/1991 (Approximate) Comment: 1993  SpO2 99%   BMI 28.91 kg/m     Wt Readings from Last 3 Encounters:  12/26/21 153 lb (69.4 kg)  07/21/21 150 lb (68 kg)  06/30/21 151 lb 6.4 oz (68.7 kg)     GEN:  Well nourished, well developed in no acute distress HEENT: Normal NECK: No JVD; No carotid bruits LYMPHATICS: No lymphadenopathy CARDIAC: RRR, no murmurs, rubs, gallops RESPIRATORY:  Clear to auscultation without rales, wheezing or rhonchi  ABDOMEN: Soft, non-tender, non-distended MUSCULOSKELETAL:  No edema; No deformity  SKIN: Warm and dry NEUROLOGIC:  Alert and oriented x 3 PSYCHIATRIC:  Normal affect   ASSESSMENT:    1. Sinus tachycardia   2. Essential hypertension   3. Paroxysmal SVT (supraventricular tachycardia) ([place])   4. Atypical chest pain   5. History of orthostatic hypotension   6. Bilateral carotid artery stenosis    PLAN:    In order of problems listed above:  Inappropriate sinus tachycardia pSVT,PACs, PVCs She reports much improved palpitations on increased dose of Toprol. Continue Toprol 8m daily.   HLD LDL 54 12/2020. Continue Lipitor 827mdaily.   Carotid artery stenosis, b/l Mild bilateral carotid atherosclerotic vascular disease. Continue statin.  H/o orthostatic hypotension BP Today is 118/78. She denies any lightheadedness or dizziness. Continue current medications.   Disposition: Follow up in 1 year(s) with MD/APP    Signed, Billiejean Schimek H Ninfa MeekerPA-C  12/26/2021 8:14 AM    [facility]

## 2021-12-26 NOTE — Patient Instructions (Signed)
Medication Instructions:   Your physician recommends that you continue on your current medications as directed. Please refer to the Current Medication list given to you today.   *If you need a refill on your cardiac medications before your next appointment, please call your pharmacy*   Lab Work: None ordered  If you have labs (blood work) drawn today and your tests are completely normal, you will receive your results only by: MyChart Message (if you have MyChart) OR A paper copy in the mail If you have any lab test that is abnormal or we need to change your treatment, we will call you to review the results.   Testing/Procedures: None ordered   Follow-Up: At [facility], you and your health needs are our priority.  As part of our continuing mission to provide you with exceptional heart care, we have created designated Provider Care Teams.  These Care Teams include your primary Cardiologist (physician) and Advanced Practice Providers (APPs -  Physician Assistants and Nurse Practitioners) who all work together to provide you with the care you need, when you need it.  We recommend signing up for the patient portal called "MyChart".  Sign up information is provided on this After Visit Summary.  MyChart is used to connect with patients for Virtual Visits (Telemedicine).  Patients are able to view lab/test results, encounter notes, upcoming appointments, etc.  Non-urgent messages can be sent to your provider as well.   To learn more about what you can do with MyChart, go to [email or web address].    Your next appointment:    Your physician wants you to follow-up in: 1 year.   You will receive a reminder letter in the mail two months in advance. If you don't receive a letter, please call our office to schedule the follow-up appointment.   The format for your next appointment:   In Person  Provider:   You may see Christopher End, MD or one of the following Advanced Practice Providers  on your designated Care Team:   Christopher Berge, NP Ryan Dunn, PA-C Cadence Furth, PA-C   Other Instructions N/A  Important Information About Sugar       

## 2021-12-27 ENCOUNTER — Ambulatory Visit: Payer: Medicare Other | Admitting: Psychiatry

## 2021-12-28 ENCOUNTER — Telehealth: Payer: Self-pay

## 2021-12-28 DIAGNOSIS — F411 Generalized anxiety disorder: Secondary | ICD-10-CM

## 2021-12-28 DIAGNOSIS — F4010 Social phobia, unspecified: Secondary | ICD-10-CM

## 2021-12-28 MED ORDER — POTASSIUM CHLORIDE CRYS ER 20 MEQ PO TBCR: 20.0000 meq | EXTENDED_RELEASE_TABLET | Freq: Two times a day (BID) | ORAL | 2 refills | Status: DC

## 2021-12-28 MED ORDER — HYDROXYZINE PAMOATE 25 MG PO CAPS: 25.0000 mg | ORAL_CAPSULE | Freq: Two times a day (BID) | ORAL | 3 refills | Status: DC | PRN

## 2021-12-28 NOTE — Telephone Encounter (Signed)
pt called states she changed pharmacy that she is using [facility] in [place]. so she will needs rx to be sent there if any needs refilling.

## 2021-12-28 NOTE — Telephone Encounter (Signed)
I have sent a new prescription for hydroxyzine to [facility] in [place].  Viibryd is not due for refill yet.

## 2021-12-29 ENCOUNTER — Ambulatory Visit: Payer: Medicare Other | Admitting: Psychiatry

## 2022-01-24 ENCOUNTER — Other Ambulatory Visit: Payer: Self-pay | Admitting: Pediatrics

## 2022-01-24 DIAGNOSIS — Z1231 Encounter for screening mammogram for malignant neoplasm of breast: Secondary | ICD-10-CM

## 2022-01-27 ENCOUNTER — Encounter: Payer: Self-pay | Admitting: Psychiatry

## 2022-01-27 ENCOUNTER — Ambulatory Visit (INDEPENDENT_AMBULATORY_CARE_PROVIDER_SITE_OTHER): Payer: Medicare Other | Admitting: Psychiatry

## 2022-01-27 VITALS — BP 119/82 | HR 98 | Temp 98.1°F | Wt 152.4 lb

## 2022-01-27 DIAGNOSIS — F5101 Primary insomnia: Secondary | ICD-10-CM

## 2022-01-27 DIAGNOSIS — F3342 Major depressive disorder, recurrent, in full remission: Secondary | ICD-10-CM | POA: Diagnosis not present

## 2022-01-27 DIAGNOSIS — F4010 Social phobia, unspecified: Secondary | ICD-10-CM | POA: Diagnosis not present

## 2022-01-27 DIAGNOSIS — F411 Generalized anxiety disorder: Secondary | ICD-10-CM | POA: Diagnosis not present

## 2022-01-27 NOTE — Progress Notes (Signed)
[facility] MD OP Progress Note  01/27/2022 10:31 AM Wanda [name]  MRN:  [id number]  Chief Complaint:  Chief Complaint  Patient presents with   Follow-up: 66 year old Caucasian female with history of depression, anxiety, presented for medication management.   HPI: Wanda [name] is a 66 year old Caucasian female, widowed, on SSI, lives in [place], has a history of GAD, MDD, social anxiety disorder, SVT, orthostatic hypotension, hyperlipidemia was evaluated in the office today.  Patient today reports she continues to have situational stressors, relationship struggles with her son-in-law.  Patient reports she tries to keep in touch with her granddaughter however she is sad that she is not getting enough time with her.  Her son-in-law does not allow that.  And that worries her.  Patient however reports the current medications as beneficial.  She reports overall she has been coping okay.  Reports she is sleeping okay.  Denies any suicidality, homicidality or perceptual disturbances.  Currently follows up with her therapist, Ms. Christina Hussami, patient reports she is willing to stay in therapy.  Patient denies any side effects to medications.  Denies any other concerns today.  Visit Diagnosis:    ICD-10-CM   1. GAD (generalized anxiety disorder)  F41.1     2. MDD (major depressive disorder), recurrent, in full remission ([place])  F33.42     3. Social anxiety disorder  F40.10     4. Primary insomnia  F51.01       Past Psychiatric History: Past psychiatric history from progress note on 08/21/2017.  Past trials of Zoloft, Cymbalta, Effexor, Paxil, Prozac, Wellbutrin, prazosin, Seroquel, trazodone, doxepin, temazepam.  Past Medical History:  Past Medical History:  Diagnosis Date   Anxiety    Basal cell carcinoma 05/03/2016   Left deltoid. Superficial.   Basal cell carcinoma 05/03/2016   Left distal lat. tricep. Nodular.   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 Dossie Arbour Serial number: [id number] Model number: [id number]     Past Surgical History:  Procedure Laterality Date   ABDOMINAL HYSTERECTOMY     APPENDECTOMY     BACK SURGERY     X 3   BREAST EXCISIONAL BIOPSY Right    surgical bx age 66    CHOLECYSTECTOMY  09/2020   COLONOSCOPY N/A 08/28/2013   Procedure: COLONOSCOPY;  Surgeon: Danie Binder, MD;  Location: [facility];  Service: Endoscopy;  Laterality: N/A;  8:30 AM   ENDOSCOPIC RETROGRADE CHOLANGIOPANCREATOGRAPHY (ERCP) WITH PROPOFOL N/A 09/20/2020   Procedure: ENDOSCOPIC RETROGRADE CHOLANGIOPANCREATOGRAPHY (ERCP) WITH PROPOFOL;  Surgeon: Lucilla Lame, MD;  Location: [facility] ENDOSCOPY;  Service: Endoscopy;  Laterality: N/A;   INTRAMEDULLARY (IM) NAIL INTERTROCHANTERIC Left 05/30/2016   Procedure: INTRAMEDULLARY (IM) NAIL INTERTROCHANTRIC;  Surgeon: Thornton Park, MD;  Location: [facility]  ORS;  Service: Orthopedics;  Laterality: Left;   SHOULDER SURGERY Right    Spinal cord stimulator     SPINAL CORD STIMULATOR REMOVAL N/A 01/12/2020   Procedure: REMOVAL SPINAL CORD STIMULATOR AND PULSE GENERATOR;  Surgeon: Deetta Perla, MD;  Location: [facility] ORS;  Service: Neurosurgery;  Laterality: N/A;  Local w/ MAC    Family Psychiatric History: Reviewed family psychiatric history from progress note on  08/21/2017.  Family History:  Family History  Problem Relation Age of Onset   Diabetes Mother    COPD Mother    Heart disease Mother    Diabetes Father    COPD Father    Heart disease Father    Heart attack Father 61       CABG   Diabetes Sister    Pancreatic cancer Sister    Tuberculosis Maternal Aunt    Cancer Paternal Aunt        breast   Breast cancer Paternal Aunt    Diabetes Paternal Grandmother    Alcohol abuse Daughter    Depression Daughter    Drug abuse Daughter    Anxiety disorder Daughter    Cancer Cousin    Diabetes Cousin    Colon cancer Neg Hx    Sudden Cardiac Death Neg Hx     Social History: Reviewed social history from progress note on 08/21/2017. Social History   Socioeconomic History   Marital status: Divorced    Spouse name: Not on file   Number of children: 2   Years of education: Not on file   Highest education level: High school graduate  Occupational History    Comment: disabled  Tobacco Use   Smoking status: Former    Packs/day: 0.25    Years: 14.00    Total pack years: 3.50    Types: Cigarettes    Quit date: 1992    Years since quitting: 31.6   Smokeless tobacco: Never  Vaping Use   Vaping Use: Never used  Substance and Sexual Activity   Alcohol use: Yes    Comment: occasional; once every 6 months   Drug use: No   Sexual activity: Yes    Birth control/protection: Surgical  Other Topics Concern   Not on file  Social History Narrative   Not on file   Social Determinants of Health   Financial Resource Strain: Medium Risk (06/25/2017)   Overall Financial Resource Strain (CARDIA)    Difficulty of Paying Living Expenses: Somewhat hard  Food Insecurity: No Food Insecurity (06/25/2017)   Hunger Vital Sign    Worried About Running Out of Food in the Last Year: Never true    [place] in the Last Year: Never true  Transportation Needs: No Transportation Needs (06/25/2017)   PRAPARE - Hydrologist  (Medical): No    Lack of Transportation (Non-Medical): No  Physical Activity: Inactive (06/25/2017)   Exercise Vital Sign    Days of Exercise per Week: 0 days    Minutes of Exercise per Session: 0 min  Stress: Stress Concern Present (06/25/2017)   [place]    Feeling of Stress : Very much  Social Connections: Somewhat Isolated (06/25/2017)   Social Connection and Isolation Panel [NHANES]    Frequency of Communication with Friends and Family: Twice a week    Frequency of Social Gatherings with Friends and Family: Never    Attends Religious Services: More than 4 times per year  Active Member of Clubs or Organizations: Yes    Attends Archivist Meetings: Never    Marital Status: Divorced    Allergies:  Allergies  Allergen Reactions   Levofloxacin Hives and Nausea And Vomiting   Tramadol Other (See Comments)    Severe headache   Pantoprazole Itching   Codeine Nausea Only    Slight nausea   Mobic [Meloxicam] Other (See Comments)    Calf tightness   Propoxyphene Hcl Rash    Metabolic Disorder Labs: Lab Results  Component Value Date   HGBA1C 5.4 05/29/2016   MPG 108 05/29/2016   No results found for: "PROLACTIN" Lab Results  Component Value Date   CHOL 164 12/02/2020   TRIG 262 (H) 12/02/2020   HDL 48 12/02/2020   CHOLHDL 3.4 12/02/2020   VLDL 59 (H) 09/29/2016   LDLCALC 74 12/02/2020   LDLCALC 78 07/08/2018   Lab Results  Component Value Date   TSH 3.700 09/19/2020   TSH 0.932 07/14/2020    Therapeutic Level Labs: No results found for: "LITHIUM" No results found for: "VALPROATE" No results found for: "CBMZ"  Current Medications: Current Outpatient Medications  Medication Sig Dispense Refill   alendronate (FOSAMAX) 70 MG tablet Take by mouth.     atorvastatin (LIPITOR) 80 MG tablet TAKE 1 TABLET(80 MG) BY MOUTH DAILY IN THE EVENING 90 tablet 1   Cholecalciferol (VITAMIN D3) 5000  units TABS Take 5,000 Units by mouth daily.     dexlansoprazole (DEXILANT) 60 MG capsule Take 60 mg by mouth daily.      famotidine (PEPCID) 40 MG tablet Take 40 mg by mouth at bedtime.     hydrOXYzine (VISTARIL) 25 MG capsule Take 1 capsule (25 mg total) by mouth 2 (two) times daily as needed for anxiety. 60 capsule 3   metoprolol succinate (TOPROL XL) 50 MG 24 hr tablet Take 1 tablet (50 mg total) by mouth daily. Take with or immediately following a meal. 90 tablet 3   Multiple Vitamin (MULTIVITAMIN) tablet Take 1 tablet by mouth daily.     naproxen (NAPROSYN) 250 MG tablet Take by mouth 2 (two) times daily with a meal.     potassium chloride SA (KLOR-CON M) 20 MEQ tablet Take 1 tablet (20 mEq total) by mouth 2 (two) times daily. 180 tablet 2   Vilazodone HCl (VIIBRYD) 40 MG TABS Take 1 tablet (40 mg total) by mouth daily. 90 tablet 0   No current facility-administered medications for this visit.     Musculoskeletal: Strength & Muscle Tone: within normal limits Gait & Station: normal Patient leans: N/A  Psychiatric Specialty Exam: Review of Systems  Psychiatric/Behavioral: Negative.    All other systems reviewed and are negative.   Blood pressure 119/82, pulse 98, temperature 98.1 F (36.7 C), temperature source Temporal, weight 152 lb 6.4 oz (69.1 kg), last menstrual period 09/30/1991.Body mass index is 28.8 kg/m.  General Appearance: Casual  Eye Contact:  Fair  Speech:  Clear and Coherent  Volume:  Normal  Mood:  Euthymic  Affect:  Congruent  Thought Process:  Goal Directed and Descriptions of Associations: Intact  Orientation:  Full (Time, Place, and Person)  Thought Content: Logical   Suicidal Thoughts:  No  Homicidal Thoughts:  No  Memory:  Immediate;   Fair Recent;   Fair Remote;   Fair  Judgement:  Fair  Insight:  Fair  Psychomotor Activity:  Normal  Concentration:  Concentration: Fair and Attention Span: Fair  Recall:  Fair  Fund of Knowledge: Fair  Language:  Fair  Akathisia:  No  Handed:  Right  AIMS (if indicated): done  Assets:  Communication Skills Desire for Improvement Housing Transportation  ADL's:  Intact  Cognition: WNL  Sleep:  Fair   Screenings: [facility] Office Visit from 01/27/2022 in [facility] Total Score 0      [facility] Visit from 01/27/2022 in [facility] Video Visit from 11/16/2020 in [facility] from 11/02/2020 in [facility]  Total GAD-7 Score 0 17 11      PHQ2-9    [place] Visit from 01/27/2022 in [facility] from 12/19/2021 in [facility] Visit from 09/28/2021 in [facility] Visit from 06/29/2021 in [facility] Visit from 06/06/2021 in [facility]  PHQ-2 Total Score 0 2 0 2 0  PHQ-9 Total Score _0 --      [facility] Office Visit from 01/27/2022 in [facility] Counselor from 12/19/2021 in [facility] Counselor from 10/31/2021 in [facility] No Risk No Risk No Risk        Assessment and Plan: Wanda [name] is a 66 year old Caucasian female, widowed, lives in [place], has a history of depression, anxiety, chronic pain, SVT was evaluated in office today.  Patient is currently stable.  Plan MDD in remission Viibryd 40 mg p.o. daily Continue CBT with Ms. Christina Hussami  GAD-stable Viibryd as prescribed Hydroxyzine 25 mg p.o. twice daily as needed  Social anxiety disorder-stable Continue CBT  Insomnia-stable Continue sleep hygiene techniques.  Reviewed notes per Ms.Kathlen Mody -dated 12/26/2021, cardiology-patient with sinus tachycardia,  paroxysmal SVT, essential hypertension, patient advised to continue Toprol 50 mg p.o. daily-good improvement.  Follow-up in clinic in 4 months or sooner if needed.  This note was generated in part or whole with voice recognition software. Voice recognition is usually quite accurate but there are transcription errors that can and very often do occur. I apologize for any typographical errors that were not detected and corrected.      Ursula Alert, MD 01/27/2022, 10:31 AM

## 2022-02-07 ENCOUNTER — Ambulatory Visit (INDEPENDENT_AMBULATORY_CARE_PROVIDER_SITE_OTHER): Payer: Medicare Other | Admitting: Licensed Clinical Social Worker

## 2022-02-07 DIAGNOSIS — F3341 Major depressive disorder, recurrent, in partial remission: Secondary | ICD-10-CM | POA: Diagnosis not present

## 2022-02-07 DIAGNOSIS — F411 Generalized anxiety disorder: Secondary | ICD-10-CM | POA: Diagnosis not present

## 2022-02-07 NOTE — Plan of Care (Signed)
  Problem: Depression CCP Problem  1 Decrease depressive symptoms and improve levels of effective functioning-pt reports a decrease in overall depression symptoms 3 out of 5 sessions documented.  Goal: LTG: Reduce frequency, intensity, and duration of depression symptoms as evidenced by: SSB input needed on appropriate metric Outcome: Progressing Goal: STG: Wanda [name] WILL PARTICIPATE IN AT LEAST 80% OF SCHEDULED INDIVIDUAL PSYCHOTHERAPY SESSIONS Outcome: Progressing Intervention: Encourage verbalization of feelings/concerns/expectations Note: Explored    Problem: Anxiety Disorder CCP Problem  1 Reduce overall frequency, intensity, and duration of the anxiety so that daily functioning is not impaired per pt self report 3 out of 5 sessions documented.   Goal: LTG: Patient will score less than 5 on the Generalized Anxiety Disorder 7 Scale (GAD-7) Outcome: Progressing Intervention: Assist with relaxation techniques, as appropriate (deep breathing exercises, meditation, guided imagery) Note: reviewed Intervention: Encourage self-care activities Note: Reviewed

## 2022-02-07 NOTE — Progress Notes (Signed)
Virtual Visit via Audio Note  I connected with Wanda [name] on 02/07/22 at 11:00 AM EDT by an audio enabled telemedicine application and verified that I am speaking with the correct person using two identifiers.  Location: Patient: home Provider: remote office [place], [place])   I discussed the limitations of evaluation and management by telemedicine and the availability of in person appointments. The patient expressed understanding and agreed to proceed.   I discussed the assessment and treatment plan with the patient. The patient was provided an opportunity to ask questions and all were answered. The patient agreed with the plan and demonstrated an understanding of the instructions.   The patient was advised to call back or seek an in-person evaluation if the symptoms worsen or if the condition fails to improve as anticipated.  I provided 45 minutes of non-face-to-face time during this encounter.   [place], Wanda [name]   THERAPIST PROGRESS NOTE  Session Time:  [id number]  Participation Level: Active  Behavioral Response: NAAlertDepressed  Type of Therapy: Family Therapy  Treatment Goals addressed: Problem: Depression CCP Problem  1 Decrease depressive symptoms and improve levels of effective functioning-pt reports a decrease in overall depression symptoms 3 out of 5 sessions documented.  Goal: LTG: Reduce frequency, intensity, and duration of depression symptoms as evidenced by: SSB input needed on appropriate metric Outcome: Progressing Goal: STG: Jame WILL PARTICIPATE IN AT LEAST 80% OF SCHEDULED INDIVIDUAL PSYCHOTHERAPY SESSIONS Outcome: Progressing Intervention: Encourage verbalization of feelings/concerns/expectations Note: Explored    Problem: Anxiety Disorder CCP Problem  1 Reduce overall frequency, intensity, and duration of the anxiety so that daily functioning is not impaired per pt self report 3 out of 5 sessions documented.   Goal: LTG: Patient will score  less than 5 on the Generalized Anxiety Disorder 7 Scale (GAD-7) Outcome: Progressing Intervention: Assist with relaxation techniques, as appropriate (deep breathing exercises, meditation, guided imagery) Note: reviewed Intervention: Encourage self-care activities Note: Reviewed   ProgressTowards Goals: Progressing  Interventions: CBT, Solution Focused, and Other: grief counseling  Summary: Wanda [name] is a 66 y.o. female who presents with continuing symptoms related to depression, anxiety, and bereavement.   Allowed pt to explore and express thoughts and feelings associated with recent life situations and external stressors. Patient reports that she is continuing to have good days, and bad days. Patient reports that she is compliant with her medication and it's getting good quality and quantity of sleep period patient reports that on an average she gets six to seven hours of sleep per night.  Patient reports that she is not looking forward to this time of year, and that she typically has a really hard time when the time changes. Patient feels that this impacts her mood in a negative way.  Discuss patients relationship with granddaughter, Elyse Hsu. Patient reports that she had a good conversation with her recently, and is happy that everything is going well in her life. Patient states that her granddaughter was excited about possibly playing volleyball or softball. Patient reports that having that contact means a lot to her. Discussed upcoming holidays, and patient is worried that she will not be able to see Elyse Hsu around the holidays because of her father.  Allowed patient safe space to explore her thoughts and feelings, and patient brought up an event about a headache that she had in the past, and her son-in-law made a really nasty comment about the headache. Allowed patient to explore her thoughts and feelings at that time and to  recognize the significant psychological impact of that  moment.  Patient reports that she recently found out that she has a great grandson, and that her son Erlene Quan has a son. Patient reports that she had a conversation with Erlene Quan, and his goal when he gets out of jail is to make good choices and be a proper father figure for his child.  Reviewed depression management strategies and anxiety management strategies. Reviewed positive behaviors that patient can engage in when she is feeling down and depressed. Encourage patient to get out and engage socially with others.  Continued recommendations are as follows: self care behaviors, positive social engagements, focusing on overall work/home/life balance, and focusing on positive physical and emotional wellness.   Suicidal/Homicidal: No  Therapist Response: Pt is continuing to apply interventions learned in session into daily life situations. Pt is currently on track to meet goals utilizing interventions mentioned above. Personal growth and progress noted. Treatment to continue as indicated.   Plan: Return again in 4 weeks.  Diagnosis:  Encounter Diagnoses  Name Primary?   MDD (major depressive disorder), recurrent, in partial remission ([place]) Yes   GAD (generalized anxiety disorder)     Collaboration of Care: Other Pt recommended to continue care with psychiatrist of record, Dr. Ursula Alert  Patient/Guardian was advised Release of Information must be obtained prior to any record release in order to collaborate their care with an outside provider. Patient/Guardian was advised if they have not already done so to contact the registration department to sign all necessary forms in order for [place] to release information regarding their care.   Consent: Patient/Guardian gives verbal consent for treatment and assignment of benefits for services provided during this visit. Patient/Guardian expressed understanding and agreed to proceed.   [place], Wanda [name] 02/07/2022

## 2022-02-08 DIAGNOSIS — L74512 Primary focal hyperhidrosis, palms: Secondary | ICD-10-CM | POA: Insufficient documentation

## 2022-02-22 ENCOUNTER — Ambulatory Visit
Admission: RE | Admit: 2022-02-22 | Discharge: 2022-02-22 | Disposition: A | Discharge status: Home / Self Care | Payer: Medicare Other | Source: Ambulatory Visit | Attending: Pediatrics | Admitting: Pediatrics

## 2022-02-22 DIAGNOSIS — Z1231 Encounter for screening mammogram for malignant neoplasm of breast: Secondary | ICD-10-CM | POA: Diagnosis present

## 2022-03-24 ENCOUNTER — Ambulatory Visit (INDEPENDENT_AMBULATORY_CARE_PROVIDER_SITE_OTHER): Payer: Medicare Other | Admitting: Licensed Clinical Social Worker

## 2022-03-24 DIAGNOSIS — F3341 Major depressive disorder, recurrent, in partial remission: Secondary | ICD-10-CM

## 2022-03-24 DIAGNOSIS — F411 Generalized anxiety disorder: Secondary | ICD-10-CM

## 2022-03-24 NOTE — Progress Notes (Signed)
Virtual Visit via Audio Note  I connected with Wanda [name] on 03/24/22 at 11:00 AM EDT by an audio enabled telemedicine application and verified that I am speaking with the correct person using two identifiers.  Location: Patient: home Provider: remote office [place], [place])   I discussed the limitations of evaluation and management by telemedicine and the availability of in person appointments. The patient expressed understanding and agreed to proceed.   I discussed the assessment and treatment plan with the patient. The patient was provided an opportunity to ask questions and all were answered. The patient agreed with the plan and demonstrated an understanding of the instructions.   The patient was advised to call back or seek an in-person evaluation if the symptoms worsen or if the condition fails to improve as anticipated.  I provided 45 minutes of non-face-to-face time during this encounter.   [place], LCSW   THERAPIST PROGRESS NOTE  Session Time:  57-3220  Participation Level: Active  Behavioral Response: NAAlertDepressed  Type of Therapy: Family Therapy  Treatment Goals addressed: Problem: Depression CCP Problem  1 Decrease depressive symptoms and improve levels of effective functioning-pt reports a decrease in overall depression symptoms 3 out of 5 sessions documented.  Goal: LTG: Reduce frequency, intensity, and duration of depression symptoms as evidenced by: SSB input needed on appropriate metric Outcome: Progressing Goal: STG: Wanda [name] WILL PARTICIPATE IN AT LEAST 80% OF SCHEDULED INDIVIDUAL PSYCHOTHERAPY SESSIONS Outcome: Progressing Intervention: Encourage verbalization of feelings/concerns/expectations Note: Explored    Problem: Anxiety Disorder CCP Problem  1 Reduce overall frequency, intensity, and duration of the anxiety so that daily functioning is not impaired per pt self report 3 out of 5 sessions documented.   Goal: LTG: Patient will score  less than 5 on the Generalized Anxiety Disorder 7 Scale (GAD-7) Outcome: Progressing Intervention: Assist with relaxation techniques, as appropriate (deep breathing exercises, meditation, guided imagery) Note: reviewed Intervention: Encourage self-care activities Note: Reviewed   ProgressTowards Goals: Progressing  Interventions: CBT, Solution Focused, and Other: grief counseling  Summary: Wanda [name] is a 66 y.o. female who presents with continuing symptoms related to depression, anxiety, and bereavement.   Allowed pt to explore and express thoughts and feelings associated with recent life situations and external stressors.Continue to explore relationship with daughter-in-law "I have issues with my daughter-in-law". Allow patient to explore what her issues were--"she is a needy person". Patient feels like because her daughter-in-law is very needy, she is not the spouse that her son deserves. Patient reports that her son cannot do anything, and cannot leave the sight of his wife without her getting upset about it. Patient reports that her grandson "was sent away for two years because of his disobedience" by DIL's account. Patient reports that she feels DIL manipulates pts son with her ongoing medical issues. Patient reports that her son and DIL does live with her mother. Patient states that she gets upset that the expectation is for her son to take his wife to all of the doctor's appointments, "when her mother is living right in the house and can do it but just doesn't want to". Allowed patient to examine things that are within her control and behaviors and situations that are out of her control.   Patient reports that it was her granddaughter Bella's birthday recently, and patient made the decision to send her a birthday card with no money in it, since patient was slightly offended when bella made a comment at [facility] when she opened up  her christmas card and there was $20 inside. Patient  reports that this was very hurtful when [name] made the comment about the money, so This is why she made the decision not to give her any money for her birthday. Patient reports that she did get a thank you text from [name]. Patient reports that [facility] sent a message back about not needing any money. Patient took the comment personally--we reframed the incident, allowing patient to see it from multiple angles, and discussed how text messages often can be read multiple ways depending on the person reading them, and how they are feeling.  Reviewed depression management strategies and anxiety management strategies. Reviewed positive behaviors that patient can engage in when she is feeling down and depressed. Encourage patient to get out and engage socially with others.  Continued recommendations are as follows: self care behaviors, positive social engagements, focusing on overall work/home/life balance, and focusing on positive physical and emotional wellness.   Suicidal/Homicidal: No  Therapist Response: Pt is continuing to apply interventions learned in session into daily life situations. Pt is currently on track to meet goals utilizing interventions mentioned above. Personal growth and progress noted. Treatment to continue as indicated.   Plan: Return again in 4 weeks.  Diagnosis:  Encounter Diagnoses  Name Primary?   MDD (major depressive disorder), recurrent, in partial remission ([place]) Yes   GAD (generalized anxiety disorder)     Collaboration of Care: Other Pt recommended to continue care with psychiatrist of record, Dr. Ursula Alert  Patient/Guardian was advised Release of Information must be obtained prior to any record release in order to collaborate their care with an outside provider. Patient/Guardian was advised if they have not already done so to contact the registration department to sign all necessary forms in order for [place] to release information regarding their care.   Consent:  Patient/Guardian gives verbal consent for treatment and assignment of benefits for services provided during this visit. Patient/Guardian expressed understanding and agreed to proceed.   [place], LCSW 03/24/2022

## 2022-03-27 ENCOUNTER — Ambulatory Visit: Payer: Medicare Other | Admitting: Dermatology

## 2022-03-27 ENCOUNTER — Encounter: Payer: Self-pay | Admitting: Dermatology

## 2022-03-27 DIAGNOSIS — Z79899 Other long term (current) drug therapy: Secondary | ICD-10-CM | POA: Diagnosis not present

## 2022-03-27 DIAGNOSIS — R61 Generalized hyperhidrosis: Secondary | ICD-10-CM

## 2022-03-27 MED ORDER — GLYCOPYRROLATE 1 MG PO TABS: 1.0000 mg | ORAL_TABLET | Freq: Every day | ORAL | 3 refills | Status: DC

## 2022-03-27 NOTE — Progress Notes (Unsigned)
   Follow-Up Visit   Subjective  Wanda [name] is a 66 y.o. female who presents for the following: Excessive Sweating (Hands and feet. C/O anxiety, makes sweating worse. Dur: >50 years. Has spoken with [facility] and they will pay for some treatment/procedure but not Botox).  The following portions of the chart were reviewed this encounter and updated as appropriate:  Tobacco  Allergies  Meds  Problems  Med Hx  Surg Hx  Fam Hx     Review of Systems: No other skin or systemic complaints except as noted in HPI or Assessment and Plan.  Objective  Well appearing patient in no apparent distress; mood and affect are within normal limits.  A focused examination was performed including hands, feet. Relevant physical exam findings are noted in the Assessment and Plan.  B/L palms and soles, axillae, inframammary folds, inguinal creases Excessive moisture without inflammation or erythema    Assessment & Plan  Hyperhidrosis B/L palms and soles, axillae, inframammary folds, inguinal creases Chronic and persistent condition with duration or expected duration over one year. Condition is bothersome/symptomatic for patient. Currently flared Discussed treatment options of Drysol, Qbrexza, Certain Dry, Robinul, Botox; iontopheresis and sympathectomy.   Glycopyrrolate can significantly increase the risk of heat stroke so you should avoid using it in the heat, particularly while active. It can also cause dry mouth, blurred vision, difficulty with urination, headache, constipation, and racing heart. Take it only as directed. Never take more than 8 tablets total per day.  Start Robinul 1 mg qd. May increase to bid if needed after 1 month.  glycopyrrolate (ROBINUL) 1 MG tablet - B/L palms and soles, axillae, inframammary folds, inguinal creases Take 1 tablet (1 mg total) by mouth daily.  Return for TBSE As Scheduled.  I, Emelia Salisbury, CMA, am acting as scribe for Sarina Ser, MD. Documentation: I  have reviewed the above documentation for accuracy and completeness, and I agree with the above.  Sarina Ser, MD

## 2022-03-27 NOTE — Patient Instructions (Addendum)
Start Glycopyrrolate '1mg'$  once daily as directed.   Glycopyrrolate/Robinul can significantly increase the risk of heat stroke so you should avoid using it in the heat, particularly while active. It can also cause dry mouth, blurred vision, difficulty with urination, headache, constipation, and racing heart. Take it only as directed. Never take more than 8 tablets total per day.   Due to recent changes in healthcare laws, you may see results of your pathology and/or laboratory studies on MyChart before the doctors have had a chance to review them. We understand that in some cases there may be results that are confusing or concerning to you. Please understand that not all results are received at the same time and often the doctors may need to interpret multiple results in order to provide you with the best plan of care or course of treatment. Therefore, we ask that you please give [place] 2 business days to thoroughly review all your results before contacting the office for clarification. Should we see a critical lab result, you will be contacted sooner.   If You Need Anything After Your Visit  If you have any questions or concerns for your doctor, please call our main line at [phone] and press option 4 to reach your doctor's medical assistant. If no one answers, please leave a voicemail as directed and we will return your call as soon as possible. Messages left after 4 pm will be answered the following business day.   You may also send [place] a message via [place]. We typically respond to MyChart messages within 1-2 business days.  For prescription refills, please ask your pharmacy to contact our office. Our fax number is [phone].  If you have an urgent issue when the clinic is closed that cannot wait until the next business day, you can page your doctor at the number below.    Please note that while we do our best to be available for urgent issues outside of office hours, we are not available 24/7.    If you have an urgent issue and are unable to reach [place], you may choose to seek medical care at your doctor's office, retail clinic, urgent care center, or emergency room.  If you have a medical emergency, please immediately call 911 or go to the emergency department.  Pager Numbers  - Dr. Nehemiah Massed: [phone]  - Dr. Laurence Ferrari: [phone]  - Dr. Nicole Kindred: [phone]  In the event of inclement weather, please call our main line at [phone] for an update on the status of any delays or closures.  Dermatology Medication Tips: Please keep the boxes that topical medications come in in order to help keep track of the instructions about where and how to use these. Pharmacies typically print the medication instructions only on the boxes and not directly on the medication tubes.   If your medication is too expensive, please contact our office at [phone] option 4 or send [place] a message through [place].   We are unable to tell what your co-pay for medications will be in advance as this is different depending on your insurance coverage. However, we may be able to find a substitute medication at lower cost or fill out paperwork to get insurance to cover a needed medication.   If a prior authorization is required to get your medication covered by your insurance company, please allow [place] 1-2 business days to complete this process.  Drug prices often vary depending on where the prescription is filled and some pharmacies may offer cheaper  prices.  The website www.goodrx.com contains coupons for medications through different pharmacies. The prices here do not account for what the cost may be with help from insurance (it may be cheaper with your insurance), but the website can give you the price if you did not use any insurance.  - You can print the associated coupon and take it with your prescription to the pharmacy.  - You may also stop by our office during regular business hours and pick up a  GoodRx coupon card.  - If you need your prescription sent electronically to a different pharmacy, notify our office through [facility] or by phone at [phone] option 4.     Si Usted Necesita Algo Despus de Su Visita  Tambin puede enviarnos un mensaje a travs de Pharmacist, community. Por lo general respondemos a los mensajes de MyChart en el transcurso de 1 a 2 das hbiles.  Para renovar recetas, por favor pida a su farmacia que se ponga en contacto con nuestra oficina. Harland Dingwall de fax es [place] [phone].  Si tiene un asunto urgente cuando la clnica est cerrada y que no puede esperar hasta el siguiente da hbil, puede llamar/localizar a su doctor(a) al nmero que aparece a continuacin.   Por favor, tenga en cuenta que aunque hacemos todo lo posible para estar disponibles para asuntos urgentes fuera del horario de [place], no estamos disponibles las 24 horas del da, los 7 das de la [place].   Si tiene un problema urgente y no puede comunicarse con nosotros, puede optar por buscar atencin mdica  en el consultorio de su doctor(a), en una clnica privada, en un centro de atencin urgente o en una sala de emergencias.  Si tiene Engineering geologist, por favor llame inmediatamente al 911 o vaya a la sala de emergencias.  Nmeros de bper  - Dr. Nehemiah Massed: [phone]  - Dra. Moye: [phone]  - Dra. Nicole Kindred: [phone]  En caso de inclemencias del [place], por favor llame a Johnsie Kindred principal al [phone] para una actualizacin sobre el [place] de cualquier retraso o cierre.  Consejos para la medicacin en dermatologa: Por favor, guarde las cajas en las que vienen los medicamentos de uso tpico para ayudarle a seguir las instrucciones sobre dnde y cmo usarlos. Las farmacias generalmente imprimen las instrucciones del medicamento slo en las cajas y no directamente en los tubos del [place].   Si su medicamento es muy caro, por favor, pngase en contacto con  Zigmund Daniel llamando al [phone] y presione la opcin 4 o envenos un mensaje a travs de Pharmacist, community.   No podemos decirle cul ser su copago por los medicamentos por adelantado ya que esto es diferente dependiendo de la cobertura de su seguro. Sin embargo, es posible que podamos encontrar un medicamento sustituto a Electrical engineer un formulario para que el seguro cubra el medicamento que se considera necesario.   Si se requiere una autorizacin previa para que su compaa de seguros [place] su medicamento, por favor permtanos de 1 a 2 das hbiles para completar este proceso.  Los precios de los medicamentos varan con frecuencia dependiendo del Environmental consultant de dnde se surte la receta y alguna farmacias pueden ofrecer precios ms baratos.  El sitio web www.goodrx.com tiene cupones para medicamentos de Airline pilot. Los precios aqu no tienen en cuenta lo que podra costar con la ayuda del seguro (puede ser ms barato con su seguro), pero el sitio web puede darle el precio si no utiliz Research scientist (physical sciences).  -  Puede imprimir el cupn correspondiente y llevarlo con su receta a la farmacia.  - Tambin puede pasar por nuestra oficina durante el horario de atencin regular y Charity fundraiser una tarjeta de cupones de GoodRx.  - Si necesita que su receta se enve electrnicamente a una farmacia diferente, informe a nuestra oficina a travs de MyChart de [place] o por telfono llamando al [phone] y presione la opcin 4.

## 2022-03-28 ENCOUNTER — Encounter: Payer: Self-pay | Admitting: Dermatology

## 2022-04-27 ENCOUNTER — Telehealth: Payer: Self-pay

## 2022-04-27 NOTE — Telephone Encounter (Signed)
This patient called in today to give you an update that the glycopyrrolate '1mg'$  is working for her and she will see you at her followup visit next month.

## 2022-05-16 ENCOUNTER — Telehealth (HOSPITAL_COMMUNITY): Payer: Self-pay | Admitting: Licensed Clinical Social Worker

## 2022-05-16 ENCOUNTER — Telehealth: Payer: Self-pay | Admitting: Psychiatry

## 2022-05-16 ENCOUNTER — Encounter: Payer: Self-pay | Admitting: Psychiatry

## 2022-05-16 DIAGNOSIS — Z634 Disappearance and death of family member: Secondary | ICD-10-CM

## 2022-05-16 MED ORDER — LORAZEPAM 0.5 MG PO TABS: 0.2500 mg | ORAL_TABLET | Freq: Every day | ORAL | 0 refills | Status: AC | PRN

## 2022-05-16 NOTE — Telephone Encounter (Signed)
Returned pts phone call and allowed pt to explore thoughts/feelings associated with grandson's death. Offered pt unconditional positive support. Pt states that her son will be coming to stay with her later on today. Pt states her best friend is also a good support system for her.  Pt provided verbal consent for clinician to reach out to psychiatrist to inform of situation and discuss medication management of symptoms.  Clinician will conduct phone follow ups biweekly for additional support. Pt aware to reach out anytime if she feels she needs additional support or crisis resources.

## 2022-05-16 NOTE — Telephone Encounter (Signed)
Contacted patient to discuss her grief since I received a message from her therapist that patient lost her grandson to an overdose this morning.  Provided supportive grief counseling.  Will start lorazepam 0.25 to 0.5 mg daily as needed for severe anxiety/grief.  Patient agreeable to limit use and uses it only if she needs it.  Patient to continue to follow up with her therapist in the meantime.  Will send a message to Ms.Kizzi to see if patient can be placed on a priority list to call back for a sooner appointment .  Patient currently denies any suicidality.

## 2022-05-17 ENCOUNTER — Ambulatory Visit: Payer: Medicare Other | Admitting: Dermatology

## 2022-06-02 ENCOUNTER — Ambulatory Visit: Payer: Medicare Other | Admitting: Psychiatry

## 2022-06-02 ENCOUNTER — Encounter: Payer: Self-pay | Admitting: Psychiatry

## 2022-06-02 VITALS — BP 137/92 | HR 105 | Temp 97.8°F | Ht 61.0 in | Wt 154.4 lb

## 2022-06-02 DIAGNOSIS — F3342 Major depressive disorder, recurrent, in full remission: Secondary | ICD-10-CM | POA: Diagnosis not present

## 2022-06-02 DIAGNOSIS — F4010 Social phobia, unspecified: Secondary | ICD-10-CM | POA: Diagnosis not present

## 2022-06-02 DIAGNOSIS — F5101 Primary insomnia: Secondary | ICD-10-CM

## 2022-06-02 DIAGNOSIS — F411 Generalized anxiety disorder: Secondary | ICD-10-CM | POA: Diagnosis not present

## 2022-06-02 DIAGNOSIS — Z634 Disappearance and death of family member: Secondary | ICD-10-CM

## 2022-06-02 MED ORDER — VILAZODONE HCL 40 MG PO TABS: 40.0000 mg | ORAL_TABLET | Freq: Every day | ORAL | 1 refills | Status: DC

## 2022-06-02 MED ORDER — HYDROXYZINE PAMOATE 25 MG PO CAPS: 25.0000 mg | ORAL_CAPSULE | Freq: Every evening | ORAL | 3 refills | Status: DC | PRN

## 2022-06-02 NOTE — Progress Notes (Signed)
[facility] MD OP Progress Note  06/02/2022 11:14 AM PAUL TORPEY  MRN:  [id number]  Chief Complaint:  Chief Complaint  Patient presents with   Follow-up   grieving   Depression   Anxiety   Medication Refill   HPI: Wanda [name] is a 67 year old Caucasian female, widowed, on SSI, lives in [place], has a history of GAD, MDD, social anxiety, SVT, orthostatic hypotension, hyperlipidemia was evaluated in office today.  Patient today grieving the loss of her grandson Erlene Quan who recently passed away due to a drug overdose.  Patient tearful in session as she talked about her grandson.  Patient's daughter, mother of her late grandson also had passed away recently.  Patient reports she has been surrounding herself with friends.  Continues to follow-up with her therapist.  Although it continues to get hard on and off.  Patient reports she uses the hydroxyzine only at bedtime as needed for sleep.  That has been beneficial.  Denies side effects.  Reports she continues to use the Viibryd as prescribed.  Denies side effects.  Denies any significant depression symptoms other than her grief.  Reports appetite as fair.  Patient appeared to be alert, oriented to person place time situation.  Patient denies any suicidality, homicidality or perceptual disturbances.  Reports she tripped and fell at the funeral home recently and currently has low back pain, currently on Aleve which does not seem to help much.  Patient advised to follow up with primary care provider.  Patient denies any other concerns today.  Visit Diagnosis:    ICD-10-CM   1. GAD (generalized anxiety disorder)  F41.1 hydrOXYzine (VISTARIL) 25 MG capsule    2. MDD (major depressive disorder), recurrent, in full remission ([place])  F33.42 Vilazodone HCl (VIIBRYD) 40 MG TABS    3. Social anxiety disorder  F40.10 hydrOXYzine (VISTARIL) 25 MG capsule    4. Primary insomnia  F51.01     5. Bereavement  Z63.4       Past Psychiatric History:  Reviewed past psychiatric history from progress note on 08/21/2017.  Past trials of Zoloft, Cymbalta, Effexor, Paxil, Prozac, Wellbutrin, prazosin, Seroquel, trazodone, doxepin, temazepam.  Past Medical History:  Past Medical History:  Diagnosis Date   Anxiety    Basal cell carcinoma 05/03/2016   Left deltoid. Superficial.   Basal cell carcinoma 05/03/2016   Left distal lat. tricep. Nodular.   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 Dossie Arbour Serial number: [id number] Model number: [id number]     Past Surgical History:  Procedure Laterality Date   ABDOMINAL HYSTERECTOMY     APPENDECTOMY     BACK SURGERY     X 3   BREAST EXCISIONAL BIOPSY Right    surgical bx age 89  CHOLECYSTECTOMY  09/2020   COLONOSCOPY N/A 08/28/2013   Procedure: COLONOSCOPY;  Surgeon: Danie Binder, MD;  [facility]: [facility];  Service: Endoscopy;  Laterality: N/A;  8:30 AM   ENDOSCOPIC RETROGRADE CHOLANGIOPANCREATOGRAPHY (ERCP) WITH PROPOFOL N/A 09/20/2020   Procedure: ENDOSCOPIC RETROGRADE CHOLANGIOPANCREATOGRAPHY (ERCP) WITH PROPOFOL;  Surgeon: Lucilla Lame, MD;  Location: [facility] END[facility];  Service: Endoscopy;  Laterality: N/A;   INTRAMEDULLARY (IM) NAIL INTERTROCHANTERIC Left 05/30/2016   Procedure: INTRAMEDULLARY (IM) NAIL INTERTROCHANTRIC;   Surgeon: Thornton Park, MD;  Location: [facility] ORS;  Service: Orthopedics;  Laterality: Left;   SHOULDER SURGERY Right    Spinal cord stimulator     SPINAL CORD STIMULATOR REMOVAL N/A 01/12/2020   Procedure: REMOVAL SPINAL CORD STIMULATOR AND PULSE GENERATOR;  Surgeon: Deetta Perla, MD;  Location: [facility] ORS;  Service: Neurosurgery;  Laterality: N/A;  Local w/ MAC    Family Psychiatric History: Reviewed family psychiatric history from progress note on 08/21/2017.  Family History:  Family History  Problem Relation Age of Onset   Diabetes Mother    COPD Mother    Heart disease Mother    Diabetes Father    COPD Father    Heart disease Father    Heart attack Father 17       CABG   Diabetes Sister    Pancreatic cancer Sister    Tuberculosis Maternal Aunt    Cancer Paternal Aunt        breast   Breast cancer Paternal Aunt    Diabetes Paternal Grandmother    Cancer Cousin    Diabetes Cousin    Alcohol abuse Daughter    Depression Daughter    Drug abuse Daughter    Anxiety disorder Daughter    Drug abuse Son    Colon cancer Neg Hx    Sudden Cardiac Death Neg Hx     Social History: Reviewed social history from progress note on 08/21/2017. Social History   Socioeconomic History   Marital status: Divorced    Spouse name: Not on file   Number of children: 2   Years of education: Not on file   Highest education level: High school graduate  Occupational History    Comment: disabled  Tobacco Use   Smoking status: Former    Packs/day: 0.25    Years: 14.00    Total pack years: 3.50    Types: Cigarettes    Quit date: 1992    Years since quitting: 32.0   Smokeless tobacco: Never  Vaping Use   Vaping Use: Never used  Substance and Sexual Activity   Alcohol use: Yes    Comment: occasional; once every 6 months   Drug use: No   Sexual activity: Yes    Birth control/protection: Surgical  Other Topics Concern   Not on file  Social History Narrative   Not on file   Social  Determinants of Health   Financial Resource Strain: Medium Risk (06/25/2017)   Overall Financial Resource Strain (CARDIA)    Difficulty of Paying Living Expenses: Somewhat hard  Food Insecurity: No Food Insecurity (06/25/2017)   Hunger Vital Sign    Worried About Running Out of Food in the Last Year: Never true    [place] in the Last Year: Never true  Transportation Needs: No Transportation Needs (06/25/2017)   PRAPARE - Hydrologist (Medical): No    Lack of Transportation (Non-Medical): No  Physical Activity: Inactive (06/25/2017)   Exercise Vital Sign  Days of Exercise per Week: 0 days    Minutes of Exercise per Session: 0 min  Stress: Stress Concern Present (06/25/2017)   [place]    Feeling of Stress : Very much  Social Connections: Somewhat Isolated (06/25/2017)   Social Connection and Isolation Panel [NHANES]    Frequency of Communication with Friends and Family: Twice a week    Frequency of Social Gatherings with Friends and Family: Never    Attends Religious Services: More than 4 times per year    Active Member of [employer] or Organizations: Yes    Attends Archivist Meetings: Never    Marital Status: Divorced    Allergies:  Allergies  Allergen Reactions   Levofloxacin Hives and Nausea And Vomiting   Tramadol Other (See Comments)    Severe headache   Pantoprazole Itching   Codeine Nausea Only    Slight nausea   Mobic [Meloxicam] Other (See Comments)    Calf tightness   Propoxyphene Hcl Rash    Metabolic Disorder Labs: Lab Results  Component Value Date   HGBA1C 5.4 05/29/2016   MPG 108 05/29/2016   No results found for: "PROLACTIN" Lab Results  Component Value Date   CHOL 164 12/02/2020   TRIG 262 (H) 12/02/2020   HDL 48 12/02/2020   CHOLHDL 3.4 12/02/2020   VLDL 59 (H) 09/29/2016   LDLCALC 74 12/02/2020   LDLCALC 78 07/08/2018   Lab Results   Component Value Date   TSH 3.700 09/19/2020   TSH 0.932 07/14/2020    Therapeutic Level Labs: No results found for: "LITHIUM" No results found for: "VALPROATE" No results found for: "CBMZ"  Current Medications: Current Outpatient Medications  Medication Sig Dispense Refill   alendronate (FOSAMAX) 70 MG tablet Take by mouth.     atorvastatin (LIPITOR) 80 MG tablet TAKE 1 TABLET(80 MG) BY MOUTH DAILY IN THE EVENING 90 tablet 1   Cholecalciferol (VITAMIN D3) 5000 units TABS Take 5,000 Units by mouth daily.     dexlansoprazole (DEXILANT) 60 MG capsule Take 60 mg by mouth daily.      famotidine (PEPCID) 40 MG tablet Take 40 mg by mouth at bedtime.     glycopyrrolate (ROBINUL) 1 MG tablet Take 1 tablet (1 mg total) by mouth daily. 30 tablet 3   metoprolol succinate (TOPROL XL) 50 MG 24 hr tablet Take 1 tablet (50 mg total) by mouth daily. Take with or immediately following a meal. 90 tablet 3   Multiple Vitamin (MULTIVITAMIN) tablet Take 1 tablet by mouth daily.     naproxen (NAPROSYN) 250 MG tablet Take by mouth 2 (two) times daily with a meal.     potassium chloride SA (KLOR-CON M) 20 MEQ tablet Take 1 tablet (20 mEq total) by mouth 2 (two) times daily. 180 tablet 2   hydrOXYzine (VISTARIL) 25 MG capsule Take 1 capsule (25 mg total) by mouth at bedtime as needed for anxiety. 30 capsule 3   Vilazodone HCl (VIIBRYD) 40 MG TABS Take 1 tablet (40 mg total) by mouth daily. 90 tablet 1   No current facility-administered medications for this visit.     Musculoskeletal: Strength & Muscle Tone: within normal limits Gait & Station: normal Patient leans: N/A  Psychiatric Specialty Exam: Review of Systems  Musculoskeletal:  Positive for back pain.  Psychiatric/Behavioral:         Grieving  All other systems reviewed and are negative.   Blood pressure (!) 137/92, pulse Marland Kitchen)  105, temperature 97.8 F (36.6 C), temperature source Temporal, height _0  (1.549 m), weight 154 lb 6.4 oz (70 kg),  last menstrual period 09/30/1991.Body mass index is 29.17 kg/m.  General Appearance: Casual  Eye Contact:  Fair  Speech:  Clear and Coherent  Volume:  Normal  Mood:   Grieving  Affect:  Tearful  Thought Process:  Goal Directed and Descriptions of Associations: Intact  Orientation:  Full (Time, Place, and Person)  Thought Content: Logical   Suicidal Thoughts:  No  Homicidal Thoughts:  No  Memory:  Immediate;   Fair Recent;   Fair Remote;   Fair  Judgement:  Fair  Insight:  Fair  Psychomotor Activity:  Normal  Concentration:  Concentration: Fair and Attention Span: Fair  Recall:  [employer] of Knowledge: Fair  Language: Fair  Akathisia:  No  Handed:  Right  AIMS (if indicated): not done  Assets:  Communication Skills Desire for Improvement Housing Social Support Transportation  ADL's:  Intact  Cognition: WNL  Sleep:  Fair   Screenings: [place] Office Visit from 01/27/2022 in [facility] Total Score 0      [place] Office Visit from 06/02/2022 in [facility] Counselor from 03/24/2022 in [facility] Office Visit from 01/27/2022 in [place] [facility] Video Visit from 11/16/2020 in [facility] from 11/02/2020 in [facility]  Total GAD-7 Score 1 9 0 17 11      PHQ2-9    [place] Visit from 06/02/2022 in [place] [facility] Counselor from 03/24/2022 in [facility] Office Visit from 01/27/2022 in [facility] from 12/19/2021 in [facility] Visit from 09/28/2021 in [facility]  PHQ-2 Total Score 0 2 0 2 0  PHQ-9 Total Score 0 _1 [place] Office Visit from 06/02/2022 in [facility] Counselor from 03/24/2022 in [place] Office Visit from 01/27/2022 in [facility] No Risk No Risk No Risk        Assessment and Plan: Wanda [name] is a 67 year old Caucasian female, currently grieving the loss of her grandson otherwise reports currently stable on medications.  Plan as noted below.  Plan  GAD-stable Viibryd 40 mg p.o. daily Hydroxyzine 25 mg p.o. daily at bedtime as needed-dose change. Continue CBT.  MDD in remission Viibryd 40 mg p.o. daily Continue CBT with Ms. Christina Hussami  Social anxiety disorder-stable Continue CBT  Insomnia-stable Continue sleep hygiene techniques. Currently on hydroxyzine which helps with sleep as needed  Bereavement-improving Patient to continue grief counseling with therapist. Provided supportive therapy in session today. Patient was provided short-term supply of lorazepam previously, currently not using it.  Reports she is okay without it.  Patient advised to follow up with primary care provider for her back pain status post recent fall when she tripped.  Follow-up in clinic in 2 months or sooner if needed.   This note was generated in part or whole with voice recognition software. Voice recognition is usually quite accurate but there are transcription errors that can and very often do occur. I apologize for any typographical errors that were not detected and corrected.     Ursula Alert, MD 06/02/2022, 11:14 AM

## 2022-07-11 ENCOUNTER — Ambulatory Visit (HOSPITAL_COMMUNITY): Payer: 59 | Admitting: Licensed Clinical Social Worker

## 2022-07-17 ENCOUNTER — Ambulatory Visit (INDEPENDENT_AMBULATORY_CARE_PROVIDER_SITE_OTHER): Payer: 59 | Admitting: Licensed Clinical Social Worker

## 2022-07-17 DIAGNOSIS — Z634 Disappearance and death of family member: Secondary | ICD-10-CM

## 2022-07-17 DIAGNOSIS — F411 Generalized anxiety disorder: Secondary | ICD-10-CM

## 2022-07-17 DIAGNOSIS — F3341 Major depressive disorder, recurrent, in partial remission: Secondary | ICD-10-CM | POA: Diagnosis not present

## 2022-07-17 NOTE — Progress Notes (Incomplete)
Virtual Visit via Audio Note  I connected with RUI SIKKEMA on 07/17/22 at  8:00 AM EST by an audio enabled telemedicine application and verified that I am speaking with the correct person using two identifiers.  Location: Patient: home Provider: remote office [place], [place])   I discussed the limitations of evaluation and management by telemedicine and the availability of in person appointments. The patient expressed understanding and agreed to proceed.   I discussed the assessment and treatment plan with the patient. The patient was provided an opportunity to ask questions and all were answered. The patient agreed with the plan and demonstrated an understanding of the instructions.   The patient was advised to call back or seek an in-person evaluation if the symptoms worsen or if the condition fails to improve as anticipated.  I provided 51 minutes of non-face-to-face time during this encounter.   [place], LCSW   THERAPIST PROGRESS NOTE  Session Time:  [phone]  Participation Level: Active  Behavioral Response: NAAlertDepressed  Type of Therapy: Family Therapy  Treatment Goals addressed: Reduce frequency, intensity, and duration of depression symptoms as evidenced by:  as evidenced by pt self report 3 out of 5 sessions documented.   Patient will score less than 5 on the Generalized Anxiety Disorder 7 Scale    ProgressTowards Goals: Progressing  Interventions: CBT, Solution Focused, and Other: grief counseling  Summary: ASHAYA BAGBY is a 67 y.o. female who presents with continuing symptoms related to depression, anxiety, and bereavement.   Allowed pt to explore and express thoughts and feelings associated with recent life situations and external stressors.  --friend told her "you don't have to worry about it anymore" THAT HAS BEEN A COMFORT TO ME--NOT WAITING ON THAT CALL ANYMORE  --church supports--soul searching "if there is a god why did he take my  child"  --discuss grief process/progress  --tried to talk to John at the funeral--asked him to step away. He started screaming at the visitation  --Elyse Hsu is 12--john has let her have a boyfriend. Bella put pics on facebookw boys  --death of jamie--relationship w/ husband/land in [place]--married for 23/24 years. I googled John. Girlfriend moved in.   --lots of loss in bellas life. Mom, pat grandma, brother  --situations involving John/jamie/bella/brandon  Continued recommendations are as follows: self care behaviors, positive social engagements, focusing on overall work/home/life balance, and focusing on positive physical and emotional wellness.   Suicidal/Homicidal: No  Therapist Response: Pt is continuing to apply interventions learned in session into daily life situations. Pt is currently on track to meet goals utilizing interventions mentioned above. Personal growth and progress noted. Treatment to continue as indicated.   Plan: Return again in 4 weeks.  Diagnosis:  No diagnosis found.   Collaboration of Care: Other Pt recommended to continue care with psychiatrist of record, Dr. Ursula Alert  Patient/Guardian was advised Release of Information must be obtained prior to any record release in order to collaborate their care with an outside provider. Patient/Guardian was advised if they have not already done so to contact the registration department to sign all necessary forms in order for [place] to release information regarding their care.   Consent: Patient/Guardian gives verbal consent for treatment and assignment of benefits for services provided during this visit. Patient/Guardian expressed understanding and agreed to proceed.   [place], LCSW 07/17/2022

## 2022-07-31 ENCOUNTER — Ambulatory Visit (INDEPENDENT_AMBULATORY_CARE_PROVIDER_SITE_OTHER): Payer: 59 | Admitting: Psychiatry

## 2022-07-31 ENCOUNTER — Encounter: Payer: Self-pay | Admitting: Psychiatry

## 2022-07-31 VITALS — BP 124/79 | HR 102 | Temp 97.7°F | Ht 61.0 in | Wt 151.8 lb

## 2022-07-31 DIAGNOSIS — F5101 Primary insomnia: Secondary | ICD-10-CM | POA: Diagnosis not present

## 2022-07-31 DIAGNOSIS — F411 Generalized anxiety disorder: Secondary | ICD-10-CM

## 2022-07-31 DIAGNOSIS — Z634 Disappearance and death of family member: Secondary | ICD-10-CM

## 2022-07-31 DIAGNOSIS — F3342 Major depressive disorder, recurrent, in full remission: Secondary | ICD-10-CM | POA: Diagnosis not present

## 2022-07-31 DIAGNOSIS — F4010 Social phobia, unspecified: Secondary | ICD-10-CM | POA: Diagnosis not present

## 2022-07-31 NOTE — Patient Instructions (Signed)
[email or web address]

## 2022-07-31 NOTE — Progress Notes (Unsigned)
[facility] MD OP Progress Note  07/31/2022 12:07 PM Wanda [name]  MRN:  [id number]  Chief Complaint:  Chief Complaint  Patient presents with   Follow-up   Anxiety   Medication Refill   Depression   HPI: Wanda [name] is a 67 year old Caucasian female, widowed, on SSI, lives in [place], has a history of GAD, MDD, social anxiety, SVT, orthostatic hypotension, hyperlipidemia was evaluated in the office today.  Patient today reports she continues to grieve the loss of her grandson who recently passed away.  She continues to have memories of her daughter who passed away prior to that.  She reports although it is difficult she has been coping okay.  She does have a church community who comes in to see her once a month.  That has helped her a lot.  She does not go into the church on Sundays since she is afraid of being in large crowds.  However she is thinking about it.  Patient reports she continues to follow up with her therapist and that has been beneficial.  Denies any significant anxiety or depression symptoms.  Current medications are beneficial.  Reports sleep is overall good.  Denies any suicidality, homicidality or perceptual disturbances.  Reports continues to have relationship struggles with her son-in-law and has difficulty spending time with her granddaughter since he does not respond to her messages in a timely manner.  Patient denies any other concerns today.  Visit Diagnosis:    ICD-10-CM   1. GAD (generalized anxiety disorder)  F41.1     2. MDD (major depressive disorder), recurrent, in full remission ([place])  F33.42     3. Social anxiety disorder  F40.10     4. Primary insomnia  F51.01     5. Bereavement  Z63.4       Past Psychiatric History: Reviewed past psychiatric history from progress note on 08/21/2017.  Past trials of Zoloft, Cymbalta, Effexor, Paxil, Prozac, Wellbutrin, prazosin, Seroquel, trazodone, doxepin, temazepam.  Past Medical History:  Past Medical  History:  Diagnosis Date   Anxiety    Basal cell carcinoma 05/03/2016   Left deltoid. Superficial.   Basal cell carcinoma 05/03/2016   Left distal lat. tricep. Nodular.   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 Dossie Arbour Serial number: [id number] Model number: [id number]     Past Surgical History:  Procedure Laterality Date   ABDOMINAL HYSTERECTOMY     APPENDECTOMY     BACK SURGERY     X 3   BREAST EXCISIONAL BIOPSY Right    surgical bx age 54    CHOLECYSTECTOMY  09/2020   COLONOSCOPY N/A 08/28/2013   Procedure: COLONOSCOPY;  Surgeon: Danie Binder, MD;  [facility];  Service: Endoscopy;  Laterality: N/A;  8:30 AM   ENDOSCOPIC RETROGRADE CHOLANGIOPANCREATOGRAPHY (ERCP) WITH PROPOFOL N/A 09/20/2020   Procedure: ENDOSCOPIC RETROGRADE CHOLANGIOPANCREATOGRAPHY (ERCP) WITH PROPOFOL;  Surgeon: Lucilla Lame, MD;  Location: [facility] ENDOSCOPY;  Service: Endoscopy;  Laterality: N/A;   INTRAMEDULLARY (IM) NAIL INTERTROCHANTERIC Left 05/30/2016   Procedure: INTRAMEDULLARY (IM) NAIL INTERTROCHANTRIC;  Surgeon: Thornton Park, MD;  Location: [facility] ORS;  Service: Orthopedics;  Laterality: Left;   SHOULDER SURGERY Right    Spinal cord stimulator     SPINAL CORD STIMULATOR REMOVAL N/A 01/12/2020   Procedure: REMOVAL SPINAL  CORD STIMULATOR AND PULSE GENERATOR;  Surgeon: Deetta Perla, MD;  Location: [facility] ORS;  Service: Neurosurgery;  Laterality: N/A;  Local w/ MAC    Family Psychiatric History: Reviewed family psychiatric history from progress note on 08/21/2017.  Family History:  Family History  Problem Relation Age of Onset   Diabetes Mother    COPD Mother    Heart disease Mother    Diabetes Father    COPD Father    Heart disease Father    Heart attack Father 17       CABG   Diabetes Sister    Pancreatic cancer Sister    Tuberculosis Maternal Aunt    Cancer Paternal Aunt        breast   Breast cancer Paternal Aunt    Diabetes Paternal Grandmother    Cancer Cousin    Diabetes Cousin    Alcohol abuse Daughter    Depression Daughter    Drug abuse Daughter    Anxiety disorder Daughter    Drug abuse Son    Colon cancer Neg Hx    Sudden Cardiac Death Neg Hx     Social History: Reviewed social history from progress note on 08/21/2017. Social History   Socioeconomic History   Marital status: Divorced    Spouse name: Not on file   Number of children: 2   Years of education: Not on file   Highest education level: High school graduate  Occupational History    Comment: disabled  Tobacco Use   Smoking status: Former    Packs/day: 0.25    Years: 14.00    Total pack years: 3.50    Types: Cigarettes    Quit date: 1992    Years since quitting: 32.1   Smokeless tobacco: Never  Vaping Use   Vaping Use: Never used  Substance and Sexual Activity   Alcohol use: Yes    Comment: occasional; once every 6 months   Drug use: No   Sexual activity: Yes    Birth control/protection: Surgical  Other Topics Concern   Not on file  Social History Narrative   Not on file   Social Determinants of Health   Financial Resource Strain: Medium Risk (06/25/2017)   Overall Financial Resource Strain (CARDIA)    Difficulty of Paying Living Expenses: Somewhat hard  Food Insecurity: No Food Insecurity (06/25/2017)    Hunger Vital Sign    Worried About Running Out of Food in the Last Year: Never true    [place] in the Last Year: Never true  Transportation Needs: No Transportation Needs (06/25/2017)   PRAPARE - Hydrologist (Medical): No    Lack of Transportation (Non-Medical): No  Physical Activity: Inactive (06/25/2017)   Exercise Vital Sign    Days of Exercise per Week: 0 days    Minutes of Exercise per Session: 0 min  Stress: Stress Concern Present (06/25/2017)   [place]  [employer] [facility] [place] Questionnaire    Feeling of Stress : Very much  Social Connections: Somewhat Isolated (06/25/2017)   Social Connection and Isolation Panel [NHANES]    Frequency of Communication with Friends and Family: Twice a week    Frequency of Social Gatherings with Friends and Family: Never    Attends Religious Services: More than 4 times per year    Active Member of [employer] or Organizations: Yes    Attends Archivist Meetings: Never    Marital Status: Divorced    Allergies:  Allergies  Allergen Reactions   Levofloxacin Hives and Nausea And Vomiting   Tramadol Other (See Comments)    Severe headache   Pantoprazole Itching   Codeine Nausea Only    Slight nausea   Mobic [Meloxicam] Other (See Comments)    Calf tightness   Propoxyphene Hcl Rash    Metabolic Disorder Labs: Lab Results  Component Value Date   HGBA1C 5.4 05/29/2016   MPG 108 05/29/2016   No results found for: "PROLACTIN" Lab Results  Component Value Date   CHOL 164 12/02/2020   TRIG 262 (H) 12/02/2020   HDL 48 12/02/2020   CHOLHDL 3.4 12/02/2020   VLDL 59 (H) 09/29/2016   LDLCALC 74 12/02/2020   LDLCALC 78 07/08/2018   Lab Results  Component Value Date   TSH 3.700 09/19/2020   TSH 0.932 07/14/2020    Therapeutic Level Labs: No results found for: "LITHIUM" No results found for: "VALPROATE" No results found for: "CBMZ"  Current Medications: Current  Outpatient Medications  Medication Sig Dispense Refill   alendronate (FOSAMAX) 70 MG tablet Take by mouth.     atorvastatin (LIPITOR) 80 MG tablet TAKE 1 TABLET(80 MG) BY MOUTH DAILY IN THE EVENING 90 tablet 1   Cholecalciferol (VITAMIN D3) 5000 units TABS Take 5,000 Units by mouth daily.     dexlansoprazole (DEXILANT) 60 MG capsule Take 60 mg by mouth daily.      famotidine (PEPCID) 40 MG tablet Take 40 mg by mouth at bedtime.     glycopyrrolate (ROBINUL) 1 MG tablet Take 1 tablet (1 mg total) by mouth daily. 30 tablet 3   hydrOXYzine (VISTARIL) 25 MG capsule Take 1 capsule (25 mg total) by mouth at bedtime as needed for anxiety. 30 capsule 3   metoprolol succinate (TOPROL XL) 50 MG 24 hr tablet Take 1 tablet (50 mg total) by mouth daily. Take with or immediately following a meal. 90 tablet 3   Multiple Vitamin (MULTIVITAMIN) tablet Take 1 tablet by mouth daily.     naproxen (NAPROSYN) 250 MG tablet Take by mouth 2 (two) times daily with a meal.     potassium chloride SA (KLOR-CON M) 20 MEQ tablet Take 1 tablet (20 mEq total) by mouth 2 (two) times daily. 180 tablet 2   Vilazodone HCl (VIIBRYD) 40 MG TABS Take 1 tablet (40 mg total) by mouth daily. 90 tablet 1   No current facility-administered medications for this visit.     Musculoskeletal: Strength & Muscle Tone: within normal limits Gait & Station: normal Patient leans: N/A  Psychiatric Specialty Exam: Review of Systems  Psychiatric/Behavioral:         Grief  All other systems reviewed and are negative.   Blood pressure 124/79, pulse (!) 102, temperature 97.7 F (36.5 C), temperature source Skin, height '5\' 1"'$  (1.549 m), weight 151 lb 12.8 oz (68.9 kg), last menstrual period 09/30/1991.Body mass index is 28.68 kg/m.  General Appearance:  Casual  Eye Contact:  Good  Speech:  Clear and Coherent  Volume:  Normal  Mood:   grief  Affect:  Appropriate  Thought Process:  Goal Directed and Descriptions of Associations: Intact   Orientation:  Full (Time, Place, and Person)  Thought Content: Logical   Suicidal Thoughts:  No  Homicidal Thoughts:  No  Memory:  Immediate;   Fair Recent;   Fair Remote;   Fair  Judgement:  Fair  Insight:  Fair  Psychomotor Activity:  Normal  Concentration:  Concentration: Fair and Attention Span: Fair  Recall:  [employer] of Knowledge: Fair  Language: Fair  Akathisia:  No  Handed:  Right  AIMS (if indicated): not done  Assets:  Communication Skills Desire for Improvement Housing Social Support  ADL's:  Intact  Cognition: WNL  Sleep:  Fair   Screenings: Administrator, Civil Service Office Visit from 01/27/2022 in [facility] Total Score 0      [place] Office Visit from 07/31/2022 in [facility] Office Visit from 06/02/2022 in [facility] Counselor from 03/24/2022 in [facility] [place] from 01/27/2022 in [facility] Video Visit from 11/16/2020 in [facility]  Total GAD-7 Score '2 1 9 '$ 0 17      PHQ2-9    [place] Office Visit from 07/31/2022 in [facility] Office Visit from 06/02/2022 in [facility] Counselor from 03/24/2022 in [place] [facility] [place] from 01/27/2022 in [facility] Counselor from 12/19/2021 in [facility]  PHQ-2 Total Score 1 0 2 0 2  PHQ-9 Total Score 4 0 '9 1 4      '$ [place] Office Visit from 07/31/2022 in [facility] Counselor from 07/17/2022 in [facility] [place] from 06/02/2022 in [facility] No Risk No Risk No Risk        Assessment and Plan: Wanda [name] is a 67 year old Caucasian female, currently grieving the loss of her grandson, continues to have relationship struggles although improving, will benefit from following plan.  Plan GAD-stable Viibryd 40 mg p.o. daily Hydroxyzine 25 mg p.o. daily at bedtime as needed Continue CBT  MDD in remission Viibryd 40 mg p.o. daily Continue CBT with Ms. Christina Hussami  Social anxiety disorder-stable Continue CBT  Insomnia-stable Continue sleep hygiene techniques Hydroxyzine as prescribed  Bereavement-improving Continue grief counseling with therapist  Patient provided education about lifestyle modification, joining [employer], being part of a group like a church.  Follow-up in clinic in 3 months or sooner if needed.   Collaboration of Care: Collaboration of Care: Other reviewed notes per therapist, encouraged to continue psychotherapy sessions.  Patient/Guardian was advised Release of Information must be obtained prior to any record release in order to collaborate their care with an outside provider. Patient/Guardian was advised if they have not already done so to contact the registration department to sign all necessary forms in order for [place] to release information regarding their care.   Consent: Patient/Guardian gives verbal consent for treatment and assignment of benefits for services provided during this visit. Patient/Guardian expressed understanding and agreed to proceed.  This note was generated in part or whole with voice recognition software. Voice recognition is usually quite accurate but there are transcription errors that can and very often do occur. I apologize for any typographical errors that were not detected and corrected.    Ursula Alert, MD 08/01/2022, 3:00 PM

## 2022-08-16 ENCOUNTER — Ambulatory Visit: Payer: Medicare Other | Admitting: Dermatology

## 2022-08-31 ENCOUNTER — Ambulatory Visit: Payer: 59 | Admitting: Dermatology

## 2022-08-31 VITALS — BP 133/85 | HR 97

## 2022-08-31 DIAGNOSIS — Z1283 Encounter for screening for malignant neoplasm of skin: Secondary | ICD-10-CM | POA: Diagnosis not present

## 2022-08-31 DIAGNOSIS — D229 Melanocytic nevi, unspecified: Secondary | ICD-10-CM

## 2022-08-31 DIAGNOSIS — L814 Other melanin hyperpigmentation: Secondary | ICD-10-CM | POA: Diagnosis not present

## 2022-08-31 DIAGNOSIS — L578 Other skin changes due to chronic exposure to nonionizing radiation: Secondary | ICD-10-CM | POA: Diagnosis not present

## 2022-08-31 DIAGNOSIS — D1801 Hemangioma of skin and subcutaneous tissue: Secondary | ICD-10-CM

## 2022-08-31 DIAGNOSIS — Z79899 Other long term (current) drug therapy: Secondary | ICD-10-CM

## 2022-08-31 DIAGNOSIS — R61 Generalized hyperhidrosis: Secondary | ICD-10-CM

## 2022-08-31 DIAGNOSIS — L820 Inflamed seborrheic keratosis: Secondary | ICD-10-CM

## 2022-08-31 DIAGNOSIS — L821 Other seborrheic keratosis: Secondary | ICD-10-CM

## 2022-08-31 DIAGNOSIS — Z85828 Personal history of other malignant neoplasm of skin: Secondary | ICD-10-CM

## 2022-08-31 MED ORDER — GLYCOPYRROLATE 1 MG PO TABS: 1.0000 mg | ORAL_TABLET | Freq: Every day | ORAL | 6 refills | Status: DC

## 2022-08-31 NOTE — Progress Notes (Signed)
Follow-Up Visit   Subjective  Wanda [name] is a 67 y.o. female who presents for the following: Skin Cancer Screening and Upper Body Skin Exam. Hx of BCC, refill medication patient take for excessive sweating on her hands, feet, arms and groin. The patient presents for Upper Body Skin Exam (UBSE) for skin cancer screening and mole check. The patient has spots, moles and lesions to be evaluated, some may be new or changing and the patient has concerns that these could be cancer.  The following portions of the chart were reviewed this encounter and updated as appropriate: medications, allergies, medical history  Review of Systems:  No other skin or systemic complaints except as noted in HPI or Assessment and Plan.  Objective  Well appearing patient in no apparent distress; mood and affect are within normal limits.  All skin waist up examined. Relevant physical exam findings are noted in the Assessment and Plan.   Assessment & Plan   Lentigines, Seborrheic Keratoses, Hemangiomas - Benign normal skin lesions - Benign-appearing - Call for any changes  Melanocytic Nevi - Tan-brown and/or pink-flesh-colored symmetric macules and papules - Benign appearing on exam today - Observation - Call clinic for new or changing moles - Recommend daily use of broad spectrum spf 30+ sunscreen to sun-exposed areas.   Actinic Damage - Chronic condition, secondary to cumulative UV/sun exposure - diffuse scaly erythematous macules with underlying dyspigmentation - Recommend daily broad spectrum sunscreen SPF 30+ to sun-exposed areas, reapply every 2 hours as needed.  - Staying in the shade or wearing long sleeves, sun glasses (UVA+UVB protection) and wide brim hats (4-inch brim around the entire circumference of the hat) are also recommended for sun protection.  - Call for new or changing lesions.  INFLAMED SEBORRHEIC KERATOSIS Exam: Erythematous keratotic or waxy stuck-on papule or  plaque.  Symptomatic, irritating, patient would like treated.  Benign-appearing.  Call clinic for new or changing lesions.   Prior to procedure, discussed risks of blister formation, small wound, skin dyspigmentation, or rare scar following treatment. Recommend Vaseline ointment to treated areas while healing.  Destruction Procedure Note Destruction method: cryotherapy   Informed consent: discussed and consent obtained   Lesion destroyed using liquid nitrogen: Yes   Outcome: patient tolerated procedure well with no complications   Post-procedure details: wound care instructions given   Locations: right mandible x 2, right chest x 1, left chest x 1 # of Lesions Treated: 4  Hyperhidrosis Location: B/L palms and soles, axillae, inframammary folds, inguinal creases Chronic and persistent condition with duration or expected duration over one year. Condition is bothersome/symptomatic for patient. Glycopyrrolate can significantly increase the risk of heat stroke so you should avoid using it in the heat, particularly while active. It can also cause dry mouth, blurred vision, difficulty with urination, headache, constipation, and racing heart. Take it only as directed. Never take more than 8 tablets total per day.   Continue  Robinul 1 mg qd.  Long term medication management.  Patient is using long term (months to years) prescription medication  to control their dermatologic condition.  These medications require periodic monitoring to evaluate for efficacy and side effects and may require periodic laboratory monitoring.  Hyperhidrosis Related Medications glycopyrrolate (ROBINUL) 1 MG tablet Take 1 tablet (1 mg total) by mouth daily.  HISTORY OF BASAL CELL CARCINOMA OF THE SKIN - No evidence of recurrence today - Recommend regular full body skin exams - Recommend daily broad spectrum sunscreen SPF 30+ to sun-exposed areas,  reapply every 2 hours as needed.  - Call if any new or changing  lesions are noted between office visits   Skin cancer screening performed today.  Return in about 1 year (around 08/31/2023) for TBSE, hx of BCC.  IAngelique Holm, CMA, am acting as scribe for Armida Sans, MD .   Documentation: I have reviewed the above documentation for accuracy and completeness, and I agree with the above.  Armida Sans, MD

## 2022-08-31 NOTE — Progress Notes (Deleted)
   Follow-Up Visit   Subjective  Wanda [name] is a 67 y.o. female who presents for the following: Skin Cancer Screening and Upper Body Skin Exam. Hx of BCC   The patient presents for Upper-Body Skin Exam (UBSE) for skin cancer screening and mole check. The patient has spots, moles and lesions to be evaluated, some may be new or changing and the patient has concerns that these could be cancer.    The following portions of the chart were reviewed this encounter and updated as appropriate: medications, allergies, medical history  Review of Systems:  No other skin or systemic complaints except as noted in HPI or Assessment and Plan.  Objective  Well appearing patient in no apparent distress; mood and affect are within normal limits.  A full examination was performed including scalp, head, eyes, ears, nose, lips, neck, chest, axillae, abdomen, back, buttocks, bilateral upper extremities, bilateral lower extremities, hands, feet, fingers, toes, fingernails, and toenails. All findings within normal limits unless otherwise noted below.   Relevant physical exam findings are noted in the Assessment and Plan.    Assessment & Plan   LENTIGINES, SEBORRHEIC KERATOSES, HEMANGIOMAS - Benign normal skin lesions - Benign-appearing - Call for any changes  MELANOCYTIC NEVI - Tan-brown and/or pink-flesh-colored symmetric macules and papules - Benign appearing on exam today - Observation - Call clinic for new or changing moles - Recommend daily use of broad spectrum spf 30+ sunscreen to sun-exposed areas.   ACTINIC DAMAGE - Chronic condition, secondary to cumulative UV/sun exposure - diffuse scaly erythematous macules with underlying dyspigmentation - Recommend daily broad spectrum sunscreen SPF 30+ to sun-exposed areas, reapply every 2 hours as needed.  - Staying in the shade or wearing long sleeves, sun glasses (UVA+UVB protection) and wide brim hats (4-inch brim around the entire  circumference of the hat) are also recommended for sun protection.  - Call for new or changing lesions.  HISTORY OF BASAL CELL CARCINOMA OF THE SKIN - No evidence of recurrence today - Recommend regular full body skin exams - Recommend daily broad spectrum sunscreen SPF 30+ to sun-exposed areas, reapply every 2 hours as needed.  - Call if any new or changing lesions are noted between office visits    SKIN CANCER SCREENING PERFORMED TODAY.   No follow-ups on file.  ***  Documentation: I have reviewed the above documentation for accuracy and completeness, and I agree with the above.  Sarina Ser, MD

## 2022-08-31 NOTE — Patient Instructions (Signed)
Due to recent changes in healthcare laws, you may see results of your pathology and/or laboratory studies on MyChart before the doctors have had a chance to review them. We understand that in some cases there may be results that are confusing or concerning to you. Please understand that not all results are received at the same time and often the doctors may need to interpret multiple results in order to provide you with the best plan of care or course of treatment. Therefore, we ask that you please give us 2 business days to thoroughly review all your results before contacting the office for clarification. Should we see a critical lab result, you will be contacted sooner.   If You Need Anything After Your Visit  If you have any questions or concerns for your doctor, please call our main line at [phone] and press option 4 to reach your doctor's medical assistant. If no one answers, please leave a voicemail as directed and we will return your call as soon as possible. Messages left after 4 pm will be answered the following business day.   You may also send us a message via MyChart. We typically respond to MyChart messages within 1-2 business days.  For prescription refills, please ask your pharmacy to contact our office. Our fax number is [phone].  If you have an urgent issue when the clinic is closed that cannot wait until the next business day, you can page your doctor at the number below.    Please note that while we do our best to be available for urgent issues outside of office hours, we are not available 24/7.   If you have an urgent issue and are unable to reach us, you may choose to seek medical care at your doctor's office, retail clinic, urgent care center, or emergency room.  If you have a medical emergency, please immediately call 911 or go to the emergency department.  Pager Numbers  - Dr. Kowalski: [phone]  - Dr. Moye: [phone]  - Dr. Stewart:  [phone]  In the event of inclement weather, please call our main line at [phone] for an update on the status of any delays or closures.  Dermatology Medication Tips: Please keep the boxes that topical medications come in in order to help keep track of the instructions about where and how to use these. Pharmacies typically print the medication instructions only on the boxes and not directly on the medication tubes.   If your medication is too expensive, please contact our office at [phone] option 4 or send us a message through MyChart.   We are unable to tell what your co-pay for medications will be in advance as this is different depending on your insurance coverage. However, we may be able to find a substitute medication at lower cost or fill out paperwork to get insurance to cover a needed medication.   If a prior authorization is required to get your medication covered by your insurance company, please allow us 1-2 business days to complete this process.  Drug prices often vary depending on where the prescription is filled and some pharmacies may offer cheaper prices.  The website www.goodrx.com contains coupons for medications through different pharmacies. The prices here do not account for what the cost may be with help from insurance (it may be cheaper with your insurance), but the website can give you the price if you did not use any insurance.  - You can print the associated coupon and take it with   your prescription to the pharmacy.  - You may also stop by our office during regular business hours and pick up a GoodRx coupon card.  - If you need your prescription sent electronically to a different pharmacy, notify our office through [facility] MyChart or by phone at [phone] option 4.     Si Usted Necesita Algo Despus de Su Visita  Tambin puede enviarnos un mensaje a travs de MyChart. Por lo general respondemos a los mensajes de MyChart en el transcurso de 1 a 2  das hbiles.  Para renovar recetas, por favor pida a su farmacia que se ponga en contacto con nuestra oficina. Nuestro nmero de fax es el [phone].  Si tiene un asunto urgente cuando la clnica est cerrada y que no puede esperar hasta el siguiente da hbil, puede llamar/localizar a su doctor(a) al nmero que aparece a continuacin.   Por favor, tenga en cuenta que aunque hacemos todo lo posible para estar disponibles para asuntos urgentes fuera del horario de oficina, no estamos disponibles las 24 horas del da, los 7 das de la semana.   Si tiene un problema urgente y no puede comunicarse con nosotros, puede optar por buscar atencin mdica  en el consultorio de su doctor(a), en una clnica privada, en un centro de atencin urgente o en una sala de emergencias.  Si tiene una emergencia mdica, por favor llame inmediatamente al 911 o vaya a la sala de emergencias.  Nmeros de bper  - Dr. Kowalski: [phone]  - Dra. Moye: [phone]  - Dra. Stewart: [phone]  En caso de inclemencias del tiempo, por favor llame a nuestra lnea principal al [phone] para una actualizacin sobre el estado de cualquier retraso o cierre.  Consejos para la medicacin en dermatologa: Por favor, guarde las cajas en las que vienen los medicamentos de uso tpico para ayudarle a seguir las instrucciones sobre dnde y cmo usarlos. Las farmacias generalmente imprimen las instrucciones del medicamento slo en las cajas y no directamente en los tubos del medicamento.   Si su medicamento es muy caro, por favor, pngase en contacto con nuestra oficina llamando al [phone] y presione la opcin 4 o envenos un mensaje a travs de MyChart.   No podemos decirle cul ser su copago por los medicamentos por adelantado ya que esto es diferente dependiendo de la cobertura de su seguro. Sin embargo, es posible que podamos encontrar un medicamento sustituto a menor costo o llenar un formulario para que el  seguro cubra el medicamento que se considera necesario.   Si se requiere una autorizacin previa para que su compaa de seguros cubra su medicamento, por favor permtanos de 1 a 2 das hbiles para completar este proceso.  Los precios de los medicamentos varan con frecuencia dependiendo del lugar de dnde se surte la receta y alguna farmacias pueden ofrecer precios ms baratos.  El sitio web www.goodrx.com tiene cupones para medicamentos de diferentes farmacias. Los precios aqu no tienen en cuenta lo que podra costar con la ayuda del seguro (puede ser ms barato con su seguro), pero el sitio web puede darle el precio si no utiliz ningn seguro.  - Puede imprimir el cupn correspondiente y llevarlo con su receta a la farmacia.  - Tambin puede pasar por nuestra oficina durante el horario de atencin regular y recoger una tarjeta de cupones de GoodRx.  - Si necesita que su receta se enve electrnicamente a una farmacia diferente, informe a nuestra oficina a travs de MyChart de [facility]   o por telfono llamando al [phone] y presione la opcin 4.  

## 2022-09-02 ENCOUNTER — Encounter: Payer: Self-pay | Admitting: Dermatology

## 2022-09-12 ENCOUNTER — Ambulatory Visit (INDEPENDENT_AMBULATORY_CARE_PROVIDER_SITE_OTHER): Payer: 59 | Admitting: Licensed Clinical Social Worker

## 2022-09-12 DIAGNOSIS — Z634 Disappearance and death of family member: Secondary | ICD-10-CM

## 2022-09-12 DIAGNOSIS — F411 Generalized anxiety disorder: Secondary | ICD-10-CM

## 2022-09-12 DIAGNOSIS — F3342 Major depressive disorder, recurrent, in full remission: Secondary | ICD-10-CM

## 2022-09-12 NOTE — Progress Notes (Addendum)
Virtual Visit via Audio Note  I connected with [name] on 09/12/22 at  9:00 AM EDT by an audio enabled telemedicine application and verified that I am speaking with the correct person using two identifiers.  Location: Patient: home Provider: remote office [place], [place])   I discussed the limitations of evaluation and management by telemedicine and the availability of in person appointments. The patient expressed understanding and agreed to proceed.   I discussed the assessment and treatment plan with the patient. The patient was provided an opportunity to ask questions and all were answered. The patient agreed with the plan and demonstrated an understanding of the instructions.   The patient was advised to call back or seek an in-person evaluation if the symptoms worsen or if the condition fails to improve as anticipated.  I provided 45 minutes of non-face-to-face time during this encounter.   Onie Hayashi R Saphira Lahmann, LCSW   THERAPIST PROGRESS NOTE  Session Time:  [phone]  Participation Level: Active  Behavioral Response: NAAlertDepressed  Type of Therapy: Family Therapy  Treatment Goals addressed: Develop healthy thinking patterns and beliefs about self, others, and the world that lead to the alleviation and help prevent the relapse of depression per self report 3 out of 5 sessions documented.   Reduce overall frequency, intensity, and duration of the anxiety so that daily functioning is not impaired per pt self report 3 out of 5 sessions documented.     ProgressTowards Goals: Progressing  Interventions: CBT, Solution Focused, and Other: grief counseling  Summary: Wanda [name] is a 67 y.o. female who presents with continuing symptoms related to depression, anxiety, and bereavement.  Patient reports that she feels her overall mood is stable, and that she is managing situational stressors well.  Patient reports that she has not felt this good in a while.  Patient very  appreciative of clinical/therapeutic support.  Clinician assisted pt with identifying situations/scenarios/schemas triggering anxiety and/or depression symptoms. Allowed pt to explore and express thoughts and feelings and discussed current coping mechanisms. Reviewed changes/recommendations.  Patient reports that she is being intentional about using her coping skills in the moment.  Patient states that she has been getting out of the house more, she is going out and spending time with extended family members, spending more quality time with her grandchildren, and patient is very happy about hearing from her granddaughter, [name].  Patient states that when she heard back from [place], it improved her mood almost instantly.  Allow patient time to explore her overall progress throughout time, and allow patient to identify coping skills and coping behaviors that have been helpful for her throughout her treatment.  Encouraged patient to continue engaging in these behaviors to maintain current levels of progress.  Briefly reflected on losses of daughter and grandson.  Discussed the process of her grieving, and allow patient to identify where she is in her overall grieving.  Continued recommendations are as follows: self care behaviors, positive social engagements, focusing on overall work/home/life balance, and focusing on positive physical and emotional wellness.   Suicidal/Homicidal: No  Therapist Response: Pt is continuing to apply interventions learned in session into daily life situations. Pt is currently on track to meet goals utilizing interventions mentioned above. Personal growth and progress noted. Treatment to continue as indicated.   Patient continues to make good progress with overall self-exploration and emotion regulation/grief management.  Patient is demonstrating greater capacity for thought and reflection, and is continuing to work through traumatic pain.  Plan: Return again in 4  weeks.  Diagnosis:  Encounter Diagnoses  Name Primary?   MDD (major depressive disorder), recurrent, in full remission Yes   GAD (generalized anxiety disorder)    Bereavement     Collaboration of Care: Other Pt recommended to continue care with psychiatrist of record, Dr. Jomarie Longs  Patient/Guardian was advised Release of Information must be obtained prior to any record release in order to collaborate their care with an outside provider. Patient/Guardian was advised if they have not already done so to contact the registration department to sign all necessary forms in order for [place] to release information regarding their care.   Consent: Patient/Guardian gives verbal consent for treatment and assignment of benefits for services provided during this visit. Patient/Guardian expressed understanding and agreed to proceed.   Active     Anxiety     LTG: Reduce overall frequency, intensity, and duration of the anxiety so that daily functioning is not impaired per pt self report 3 out of 5 sessions documented.   (Progressing)     Start:  07/18/22    Expected End:  01/25/23         STG: Reduce overall frequency, intensity, and duration of the anxiety so that daily functioning is not impaired per pt self report 3 out of 5 sessions documented.   (Progressing)     Start:  07/18/22    Expected End:  01/25/23         Work with patient individually to identify the major components of a recent episode of anxiety: physical symptoms, major thoughts and images, and major behaviors they experienced     Start:  07/18/22       Intervention Note     Reviewed with patient during session.          Perform motivational interviewing regarding use of tools     Start:  07/18/22       Intervention Note     Reviewed with patient during session.            OP Depression     LTG: Decrease depressive symptoms and improve levels of effective functioning-pt reports a decrease in overall  depression symptoms 3 out of 5 sessions documented.  (Progressing)     Start:  07/18/22    Expected End:  01/25/23         STG Develop healthy thinking patterns and beliefs about self, others, and the world that lead to the alleviation and help prevent the relapse of depression per self report 3 out of 5 sessions documented.   (Progressing)     Start:  07/18/22    Expected End:  01/25/23         Therapist will review PLEASE Skills (Treat Physical Illness, Balance Eating, Avoid Mood-Altering Substances, Balance Sleep and Get Exercise) with patient     Start:  07/18/22       Intervention Note     Reviewed with patient during session.          Allow pt to explore and express issues related to grief/loss as they arise     Start:  07/18/22       Intervention Note     Reviewed with patient during session.             Tanza Pellot Liliauna Santoni, LCSW 09/12/2022

## 2022-10-30 ENCOUNTER — Ambulatory Visit: Payer: 59 | Admitting: Psychiatry

## 2022-11-06 ENCOUNTER — Other Ambulatory Visit: Payer: Self-pay

## 2022-11-06 MED ORDER — POTASSIUM CHLORIDE CRYS ER 20 MEQ PO TBCR: 20.0000 meq | EXTENDED_RELEASE_TABLET | Freq: Two times a day (BID) | ORAL | 0 refills | Status: DC

## 2022-12-07 ENCOUNTER — Ambulatory Visit: Payer: Medicare HMO | Admitting: Psychiatry

## 2022-12-07 ENCOUNTER — Encounter: Payer: Self-pay | Admitting: Psychiatry

## 2022-12-07 VITALS — BP 134/78 | HR 101 | Temp 97.8°F | Ht 61.0 in | Wt 157.4 lb

## 2022-12-07 DIAGNOSIS — F3342 Major depressive disorder, recurrent, in full remission: Secondary | ICD-10-CM | POA: Diagnosis not present

## 2022-12-07 DIAGNOSIS — F5101 Primary insomnia: Secondary | ICD-10-CM | POA: Diagnosis not present

## 2022-12-07 DIAGNOSIS — F411 Generalized anxiety disorder: Secondary | ICD-10-CM | POA: Diagnosis not present

## 2022-12-07 DIAGNOSIS — F4010 Social phobia, unspecified: Secondary | ICD-10-CM

## 2022-12-07 DIAGNOSIS — Z634 Disappearance and death of family member: Secondary | ICD-10-CM

## 2022-12-07 MED ORDER — VILAZODONE HCL 40 MG PO TABS: 40.0000 mg | ORAL_TABLET | Freq: Every day | ORAL | 1 refills | Status: DC

## 2022-12-07 MED ORDER — HYDROXYZINE PAMOATE 25 MG PO CAPS: 25.0000 mg | ORAL_CAPSULE | Freq: Every evening | ORAL | 5 refills | Status: DC | PRN

## 2022-12-07 NOTE — Progress Notes (Signed)
BH MD OP Progress Note  12/07/2022 9:50 AM Wanda [name]  MRN:  [id number]  Chief Complaint:  Chief Complaint  Patient presents with   Follow-up   Depression   Anxiety   Insomnia   Medication Refill   HPI: Wanda [name] is a 67 year old Caucasian female, widowed, on SSI, lives in [place], has a history of GAD, MDD, social anxiety, SVT, orthostatic hypotension, hyperlipidemia was evaluated in office today.  Patient today reports she is currently better with regards to her mood symptoms.  Denies any significant anxiety.  Reports she is dealing with her grief better than before.  Currently compliant on her medications like Viibryd, hydroxyzine.  Denies side effects.  Patient reports sleep is overall good.  Patient reports she was able to spent time with her grandchild recently.  Her son-in-law is more accessible now and that helps.  She is working on her relationship with her grandchild at this time.  Excited that she is able to be part of her life.  Patient denies any suicidality, homicidality or perceptual disturbances.  Patient denies any other concerns today.  Visit Diagnosis:    ICD-10-CM   1. GAD (generalized anxiety disorder)  F41.1 hydrOXYzine (VISTARIL) 25 MG capsule    2. MDD (major depressive disorder), recurrent, in full remission (HCC)  F33.42 Vilazodone HCl (VIIBRYD) 40 MG TABS    3. Social anxiety disorder  F40.10 hydrOXYzine (VISTARIL) 25 MG capsule    4. Primary insomnia  F51.01     5. Bereavement  Z63.4       Past Psychiatric History: I have reviewed past psychiatric history from progress note on 08/21/2017.  Past trials of Zoloft, Cymbalta, Effexor, Paxil, Prozac, Wellbutrin, prazosin, Seroquel , trazodone, doxepin, temazepam.  Past Medical History:  Past Medical History:  Diagnosis Date   Anxiety    Basal cell carcinoma 05/03/2016   Left deltoid. Superficial.   Basal cell carcinoma 05/03/2016   Left distal lat. tricep. Nodular.   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 Laban Emperor Serial number: [id number] Model number: [id number]     Past Surgical History:  Procedure Laterality Date   ABDOMINAL HYSTERECTOMY     APPENDECTOMY     BACK SURGERY     X 3   BREAST EXCISIONAL BIOPSY Right    surgical bx age 26    CHOLECYSTECTOMY  09/2020   COLONOSCOPY N/A 08/28/2013   Procedure: COLONOSCOPY;  Surgeon: West Bali, MD;  [facility];  Service: Endoscopy;  Laterality: N/A;  8:30 AM   ENDOSCOPIC RETROGRADE CHOLANGIOPANCREATOGRAPHY (ERCP) WITH PROPOFOL N/A 09/20/2020   Procedure: ENDOSCOPIC RETROGRADE CHOLANGIOPANCREATOGRAPHY (ERCP) WITH PROPOFOL;  Surgeon: Midge Minium, MD;  Location: [facility];  Service: Endoscopy;  Laterality: N/A;  INTRAMEDULLARY (IM) NAIL INTERTROCHANTERIC Left 05/30/2016   Procedure: INTRAMEDULLARY (IM) NAIL INTERTROCHANTRIC;  Surgeon: Juanell Fairly, MD;  Location: [facility];  Service: Orthopedics;  Laterality: Left;   SHOULDER SURGERY Right    Spinal cord stimulator     SPINAL CORD STIMULATOR REMOVAL N/A 01/12/2020   Procedure: REMOVAL SPINAL CORD STIMULATOR AND PULSE GENERATOR;  Surgeon: Lucy Chris, MD;  Location: [facility];  Service: Neurosurgery;  Laterality: N/A;  Local w/ MAC    Family Psychiatric History: I have reviewed family  psychiatric history from progress note on 08/21/2017.  Family History:  Family History  Problem Relation Age of Onset   Diabetes Mother    COPD Mother    Heart disease Mother    Diabetes Father    COPD Father    Heart disease Father    Heart attack Father 31       CABG   Diabetes Sister    Pancreatic cancer Sister    Tuberculosis Maternal Aunt    Cancer Paternal Aunt        breast   Breast cancer Paternal Aunt    Diabetes Paternal Grandmother    Cancer Cousin    Diabetes Cousin    Alcohol abuse Daughter    Depression Daughter    Drug abuse Daughter    Anxiety disorder Daughter    Drug abuse Son    Colon cancer Neg Hx    Sudden Cardiac Death Neg Hx     Social History: I have reviewed social history from progress note on 08/21/2017. Social History   Socioeconomic History   Marital status: Divorced    Spouse name: Not on file   Number of children: 2   Years of education: Not on file   Highest education level: High school graduate  Occupational History    Comment: disabled  Tobacco Use   Smoking status: Former    Current packs/day: 0.00    Average packs/day: 0.3 packs/day for 14.0 years (3.5 ttl pk-yrs)    Types: Cigarettes    Start date: 11    Quit date: 25    Years since quitting: 32.5   Smokeless tobacco: Never  Vaping Use   Vaping status: Never Used  Substance and Sexual Activity   Alcohol use: Yes    Comment: occasional; once every 6 months   Drug use: No   Sexual activity: Yes    Birth control/protection: Surgical  Other Topics Concern   Not on file  Social History Narrative   Not on file   Social Determinants of Health   Financial Resource Strain: Medium Risk (06/25/2017)   Overall Financial Resource Strain (CARDIA)    Difficulty of Paying Living Expenses: Somewhat hard  Food Insecurity: No Food Insecurity (06/25/2017)   Hunger Vital Sign    Worried About Running Out of Food in the Last Year: Never true    Ran Out of Food in the Last Year:  Never true  Transportation Needs: No Transportation Needs (06/25/2017)   PRAPARE - Administrator, Civil Service (Medical): No    Lack of Transportation (Non-Medical): No  Physical Activity: Inactive (06/25/2017)   Exercise Vital Sign    Days of Exercise per Week: 0 days    Minutes of Exercise per Session: 0 min  Stress: Stress Concern Present (06/25/2017)   [employer] of Occupational Health - Occupational Stress Questionnaire    Feeling of Stress : Very much  Social Connections: Somewhat Isolated (06/25/2017)   Social Connection and Isolation  Panel [NHANES]    Frequency of Communication with Friends and Family: Twice a week    Frequency of Social Gatherings with Friends and Family: Never    Attends Religious Services: More than 4 times per year    Active Member of [employer] or Organizations: Yes    Attends Banker Meetings: Never    Marital Status: Divorced    Allergies:  Allergies  Allergen Reactions   Levofloxacin Hives and Nausea And Vomiting   Tramadol Other (See Comments)    Severe headache   Pantoprazole Itching   Codeine Nausea Only    Slight nausea   Mobic [Meloxicam] Other (See Comments)    Calf tightness   Propoxyphene Hcl Rash    Metabolic Disorder Labs: Lab Results  Component Value Date   HGBA1C 5.4 05/29/2016   MPG 108 05/29/2016   No results found for: "PROLACTIN" Lab Results  Component Value Date   CHOL 164 12/02/2020   TRIG 262 (H) 12/02/2020   HDL 48 12/02/2020   CHOLHDL 3.4 12/02/2020   VLDL 59 (H) 09/29/2016   LDLCALC 74 12/02/2020   LDLCALC 78 07/08/2018   Lab Results  Component Value Date   TSH 3.700 09/19/2020   TSH 0.932 07/14/2020    Therapeutic Level Labs: No results found for: "LITHIUM" No results found for: "VALPROATE" No results found for: "CBMZ"  Current Medications: Current Outpatient Medications  Medication Sig Dispense Refill   alendronate (FOSAMAX) 70 MG tablet Take by mouth.     atorvastatin  (LIPITOR) 80 MG tablet TAKE 1 TABLET(80 MG) BY MOUTH DAILY IN THE EVENING 90 tablet 1   Cholecalciferol (VITAMIN D3) 5000 units TABS Take 5,000 Units by mouth daily.     dexlansoprazole (DEXILANT) 60 MG capsule Take 60 mg by mouth daily.      famotidine (PEPCID) 40 MG tablet Take 40 mg by mouth at bedtime.     glycopyrrolate (ROBINUL) 1 MG tablet Take 1 tablet (1 mg total) by mouth daily. 30 tablet 6   metoprolol succinate (TOPROL XL) 50 MG 24 hr tablet Take 1 tablet (50 mg total) by mouth daily. Take with or immediately following a meal. 90 tablet 3   Multiple Vitamin (MULTIVITAMIN) tablet Take 1 tablet by mouth daily.     naproxen (NAPROSYN) 250 MG tablet Take by mouth 2 (two) times daily with a meal.     potassium chloride SA (KLOR-CON M) 20 MEQ tablet Take 1 tablet (20 mEq total) by mouth 2 (two) times daily. PLEASE CALL [phone] TO SCHEDULE YEARLY APPOINTMENT AND TO RECEIVE FURTHER REFILLS. THANK YOU. 180 tablet 0   hydrOXYzine (VISTARIL) 25 MG capsule Take 1 capsule (25 mg total) by mouth at bedtime as needed for anxiety. 30 capsule 5   Vilazodone HCl (VIIBRYD) 40 MG TABS Take 1 tablet (40 mg total) by mouth daily. 90 tablet 1   No current facility-administered medications for this visit.     Musculoskeletal: Strength & Muscle Tone: within normal limits Gait & Station: normal Patient leans: N/A  Psychiatric Specialty Exam: Review of Systems  Psychiatric/Behavioral: Negative.      Blood pressure 134/78, pulse (!) 101, temperature 97.8 F (36.6 C), temperature source Skin, height 5\' 1"  (1.549 m), weight 157 lb 6.4 oz (71.4 kg), last menstrual period 09/30/1991.Body mass index is 29.74 kg/m.  General Appearance: Casual  Eye Contact:  Fair  Speech:  Clear and Coherent  Volume:  Normal  Mood:  Euthymic  Affect:  Congruent  Thought Process:  Goal Directed and Descriptions of Associations: Intact  Orientation:  Full (Time, Place, and Person)  Thought Content: Logical    Suicidal Thoughts:  No  Homicidal Thoughts:  No  Memory:  Immediate;   Fair Recent;   Fair Remote;   Fair  Judgement:  Fair  Insight:  Fair  Psychomotor Activity:  Normal  Concentration:  Concentration: Fair and Attention Span: Fair  Recall:  [employer] of Knowledge: Fair  Language: Fair  Akathisia:  No  Handed:  Right  AIMS (if indicated): not done  Assets:  Communication Skills Desire for Improvement Housing Social Support  ADL's:  Intact  Cognition: WNL  Sleep:  Fair   Screenings: Geneticist, molecular Office Visit from 01/27/2022 in [facility]  AIMS Total Score 0      GAD-7    Flowsheet Row Office Visit from 12/07/2022 in [facility] Office Visit from 07/31/2022 in [facility] Office Visit from 06/02/2022 in [facility] Counselor from 03/24/2022 in [facility] at [facility] Visit from 01/27/2022 in [facility]  Total GAD-7 Score 1 2 1 9  0      PHQ2-9    Flowsheet Row Office Visit from 12/07/2022 in [facility] Office Visit from 07/31/2022 in [facility] Office Visit from 06/02/2022 in [facility] Counselor from 03/24/2022 in [facility] at [facility] Visit from 01/27/2022 in [facility]  PHQ-2 Total Score 0 1 0 2 0  PHQ-9 Total Score 1 4 0 9 1      Flowsheet Row Office Visit from 12/07/2022 in [facility] Counselor from 09/12/2022 in [facility] at [facility] Visit from 07/31/2022 in [facility]  C-SSRS RISK CATEGORY No Risk No Risk  No Risk        Assessment and Plan: MCKENSIE SCOTTI is a 67 year old Caucasian female, with GAD, MDD, insomnia, currently stable on current medication regimen, will benefit from continued medication management and psychotherapy sessions.  Plan as noted below.  Plan GAD-stable Viibryd 40 mg p.o. daily Hydroxyzine 25 mg p.o. daily at bedtime as needed Continue CBT  MDD in remission Viibryd 40 mg p.o. daily Continue CBT with Ms. Christina Hussami  Social anxiety disorder-stable Continue CBT  Insomnia-stable Continue sleep hygiene techniques Hydroxyzine as prescribed  Bereavement-stable Continue grief counseling with psychotherapist.  Follow-up in clinic in 5 to 6 months or sooner if needed.  Collaboration of Care: Collaboration of Care: Referral or follow-up with counselor/therapist AEB patient encouraged to continue CBT.  Patient/Guardian was advised Release of Information must be obtained prior to any record release in order to collaborate their care with an outside provider. Patient/Guardian was advised if they have not already done so to contact the registration department to sign all necessary forms in order for [place] to release information regarding their care.   Consent: Patient/Guardian gives verbal consent for treatment and assignment of benefits for services provided during this visit. Patient/Guardian expressed understanding and agreed to proceed.   This note was generated in part or whole with voice recognition software. Voice recognition is usually quite accurate but there are transcription errors that can and very often do occur. I apologize for any typographical errors that  were not detected and corrected.    Jomarie Longs, MD 12/08/2022, 8:30 AM

## 2022-12-25 ENCOUNTER — Ambulatory Visit (INDEPENDENT_AMBULATORY_CARE_PROVIDER_SITE_OTHER): Payer: Medicare HMO | Admitting: Licensed Clinical Social Worker

## 2022-12-25 DIAGNOSIS — F411 Generalized anxiety disorder: Secondary | ICD-10-CM

## 2022-12-25 DIAGNOSIS — Z634 Disappearance and death of family member: Secondary | ICD-10-CM | POA: Diagnosis not present

## 2022-12-25 DIAGNOSIS — F3341 Major depressive disorder, recurrent, in partial remission: Secondary | ICD-10-CM | POA: Diagnosis not present

## 2022-12-25 NOTE — Progress Notes (Signed)
Virtual Visit via Audio Note  I connected with Wanda [name] on 12/25/22 at 10:00 AM EDT by an audio enabled telemedicine application and verified that I am speaking with the correct person using two identifiers.  Location: Patient: home Provider: remote office [place], [place])   I discussed the limitations of evaluation and management by telemedicine and the availability of in person appointments. The patient expressed understanding and agreed to proceed.   I discussed the assessment and treatment plan with the patient. The patient was provided an opportunity to ask questions and all were answered. The patient agreed with the plan and demonstrated an understanding of the instructions.   The patient was advised to call back or seek an in-person evaluation if the symptoms worsen or if the condition fails to improve as anticipated.  I provided 50 minutes of non-face-to-face time during this encounter.   Sheffield Hawker R Bomani Oommen, LCSW   THERAPIST PROGRESS NOTE  Session Time:  [id number]  Participation Level: Active  Behavioral Response: NAAlertDepressed  Type of Therapy: Family Therapy  Treatment Goals addressed: Develop healthy thinking patterns and beliefs about self, others, and the world that lead to the alleviation and help prevent the relapse of depression per self report 3 out of 5 sessions documented.   Reduce overall frequency, intensity, and duration of the anxiety so that daily functioning is not impaired per pt self report 3 out of 5 sessions documented.     ProgressTowards Goals: Progressing  Interventions: CBT, Solution Focused, and Other: grief counseling  Summary: Wanda [name] is a 67 y.o. female who presents with continuing symptoms related to depression, anxiety, and bereavement.  Patient reports that she feels her overall mood is stable, and that she is managing situational stressors well.    Clinician assisted pt with identifying situations/scenarios/schemas  triggering anxiety and/or depression symptoms. Allowed pt to explore and express thoughts and feelings and discussed current coping mechanisms. Reviewed changes/recommendations. Discussed recent visit with granddaughter--pt is very happy that communication with her son in law has improved--he actually texted her back and allowed her to spend quality time with her granddaughter. Pt reports that she had a good time with her granddaughter and is continual contact with her. Pt experiencing some confusion over a new "relationship" with a  gentleman she has known for quite some time. Pt reports that she has been single for 20+ years and is used to doing things her way and not having to check in on someone. Pt states that this man is in the process of a divorce and is wanting to spend every weekend with her.Pt states that she is having to set limits and boundaries with him because she wants some weekends to herself. Encouraged pt to to not look too forward into the future, just take it day by day. Encouraged healthy compromise--not doing things 100% his way or her way, just communicate and develop a "middle of the road" decision that both are happy with. Allowed pt to brainstorm several compromises that she could be happy with.   Allowed pt to explore thoughts and feelings associated with loss of daughter and grandson Discussed pts awareness of how loss has impacted overall life. Clinician provided empathy and support while giving patient safe space to explore grief. Pt feels that she is doing a good job of managing her overall grief and is aware of the overall psychological impact. Pt wants to be a positive role model for her granddaughter.   Pt reports that she feels well supported  by friends and family.   Continued recommendations are as follows: self care behaviors, positive social engagements, focusing on overall work/home/life balance, and focusing on positive physical and emotional wellness.    Suicidal/Homicidal: No  Therapist Response: Pt is continuing to apply interventions learned in session into daily life situations. Pt is currently on track to meet goals utilizing interventions mentioned above. Personal growth and progress noted. Treatment to continue as indicated.   Patient continues to make good progress with overall self-exploration and emotion regulation/grief management.  Patient is demonstrating greater capacity for thought and reflection, and is continuing to work through traumatic pain.    Plan: Informed patient that clinician will be leaving outpatient department. Allowed pt to explore any questions or concerns and discussed future counseling options/resources. Provided pt with psychoeducational resources and list of OPT therapists. Encouraged pt to continue with psychiatric med management appointments, if applicable.   Diagnosis:  Encounter Diagnoses  Name Primary?   MDD (major depressive disorder), recurrent, in partial remission (HCC) Yes   GAD (generalized anxiety disorder)    Bereavement    Collaboration of Care: Other Pt recommended to continue care with psychiatrist of record, Dr. Jomarie Longs  Patient/Guardian was advised Release of Information must be obtained prior to any record release in order to collaborate their care with an outside provider. Patient/Guardian was advised if they have not already done so to contact the registration department to sign all necessary forms in order for [place] to release information regarding their care.   Consent: Patient/Guardian gives verbal consent for treatment and assignment of benefits for services provided during this visit. Patient/Guardian expressed understanding and agreed to proceed.    Keidra Doose Jamespaul Secrist, LCSW 12/25/2022

## 2023-01-05 ENCOUNTER — Ambulatory Visit: Payer: 59 | Admitting: Medical

## 2023-01-22 ENCOUNTER — Other Ambulatory Visit: Payer: Self-pay | Admitting: Pediatrics

## 2023-01-22 DIAGNOSIS — Z1231 Encounter for screening mammogram for malignant neoplasm of breast: Secondary | ICD-10-CM

## 2023-02-03 ENCOUNTER — Other Ambulatory Visit: Payer: Self-pay | Admitting: Medical

## 2023-02-13 ENCOUNTER — Other Ambulatory Visit: Payer: Self-pay | Admitting: Pediatrics

## 2023-02-13 DIAGNOSIS — M810 Age-related osteoporosis without current pathological fracture: Secondary | ICD-10-CM

## 2023-02-17 ENCOUNTER — Encounter: Payer: Self-pay | Admitting: Internal Medicine

## 2023-02-23 ENCOUNTER — Other Ambulatory Visit: Payer: Medicare HMO

## 2023-02-26 ENCOUNTER — Ambulatory Visit
Admission: RE | Admit: 2023-02-26 | Discharge: 2023-02-26 | Disposition: A | Discharge status: Home / Self Care | Payer: Medicare HMO | Source: Ambulatory Visit | Attending: Pediatrics | Admitting: Pediatrics

## 2023-02-26 DIAGNOSIS — Z1231 Encounter for screening mammogram for malignant neoplasm of breast: Secondary | ICD-10-CM | POA: Diagnosis present

## 2023-02-27 ENCOUNTER — Ambulatory Visit: Payer: Self-pay | Admitting: Medical

## 2023-02-27 IMAGING — MR MR ABDOMEN WO/W CM
17 of 19 series · 44 of 48 positions shown · IV contrast (gadavist)
Comparison: MRI abdomen and CT abdomen September 18, 2020.

CLINICAL DATA: Follow-up of rim enhancing cystic lesions seen on
prior MRI.

EXAM:
MRI ABDOMEN WITHOUT AND WITH CONTRAST
TECHNIQUE: Multiplanar multisequence MR imaging of the abdomen was performed
both before and after the administration of intravenous contrast.
CONTRAST:  6mL GADAVIST GADOBUTROL 1 MMOL/ML IV SOLN

[Series 3: T2 · coronal · 6.0mm · 1.19mm/px · 1 of 30 slices shown (1 of 3)]
[im 1/30]
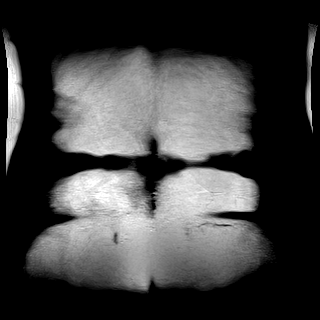

[Series 4: T2 · axial · 6.0mm · 1.19mm/px · 1 of 32 slices shown (2 of 3)]
[im 1/32]
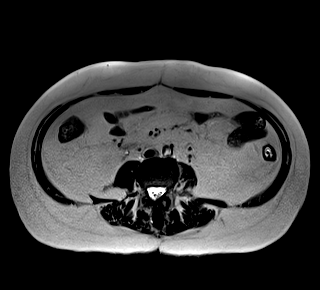

[Series 5: T1 · axial · 3.0mm · 1.19mm/px · z∈[-95,+118]mm · 2 of 72 slices shown (1 of 2)]
[im 1/72]
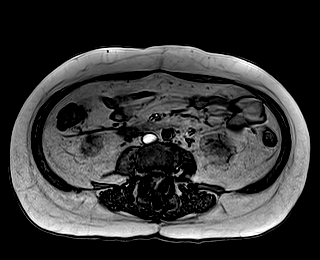
[im 72/72]
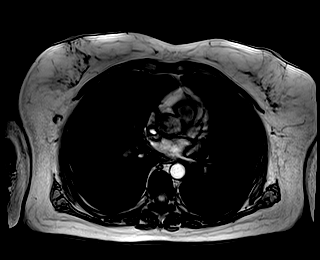

[Series 6: T1 · axial · 3.0mm · 1.19mm/px · z∈[-95,+118]mm · 2 of 72 slices shown (2 of 2)]
[im 1/72]
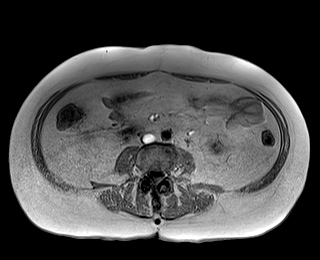
[im 72/72]
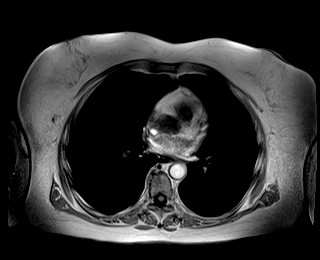

[Series 9: T2 · axial · 6.0mm · 1.19mm/px · 1 of 32 slices shown (3 of 3)]
[im 1/32]
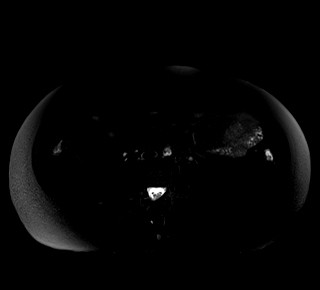

[Series 10: ax dwi_tracew · axial · 6.0mm · 1.42mm/px · z∈[-133,+133]mm · 5 of 114 slices shown]
[im 1/114]
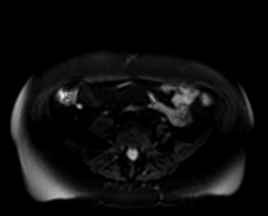
[im 29/114]
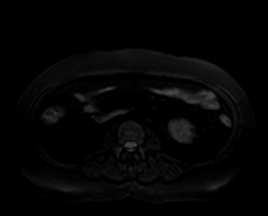
[im 57/114]
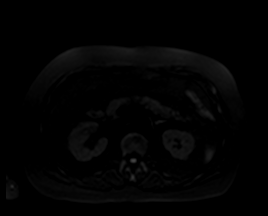
[im 85/114]
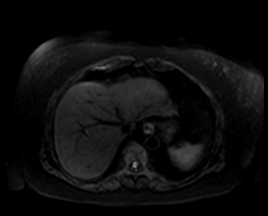
[im 114/114]
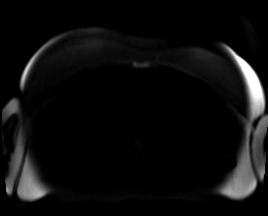

[Series 11: ax dwi_adc · axial · 6.0mm · 1.42mm/px · z∈[-133,+133]mm · 2 of 38 slices shown]
[im 1/38]
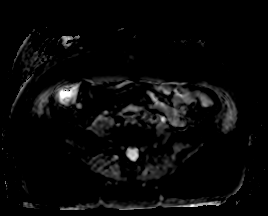
[im 38/38]
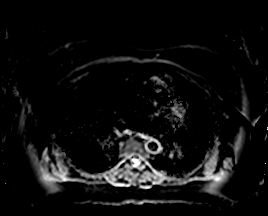

[Series 17: T1 dynamic fat-sat · axial · non-contrast · 3.0mm · 1.19mm/px · z∈[-95,+118]mm · 3 of 72 slices shown (1 of 5)]
[im 1/72]
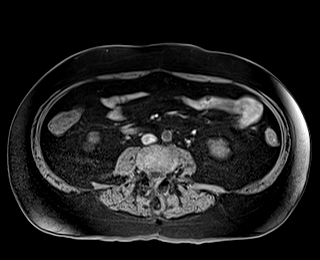
[im 36/72]
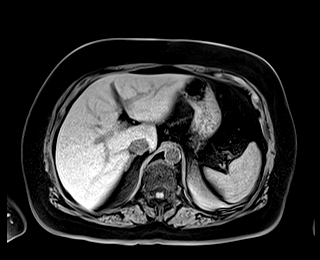
[im 72/72]
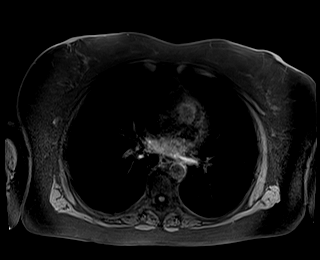

[Series 18: T1 dynamic fat-sat post-contrast · axial · 3.0mm · 1.19mm/px · z∈[-95,+118]mm · 3 of 72 slices shown (1 of 4)]
[im 1/72]
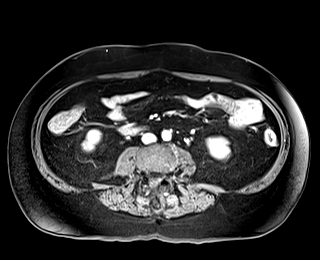
[im 36/72]
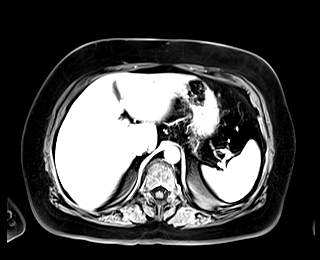
[im 72/72]
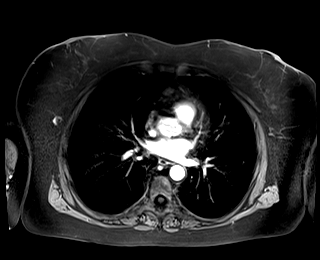

[Series 19: T1 dynamic fat-sat · axial · 3.0mm · 1.19mm/px · z∈[-95,+118]mm · 3 of 72 slices shown (2 of 5)]
[im 1/72]
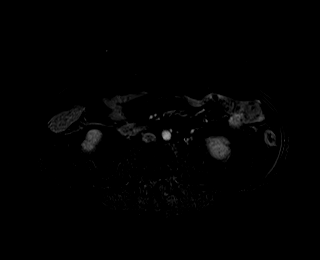
[im 36/72]
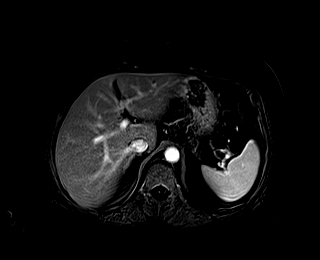
[im 72/72]
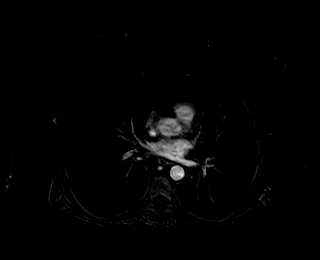

[Series 20: T1 dynamic fat-sat post-contrast · axial · 3.0mm · 1.19mm/px · z∈[-95,+118]mm · 3 of 72 slices shown (2 of 4)]
[im 1/72]
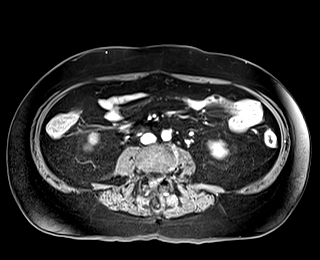
[im 36/72]
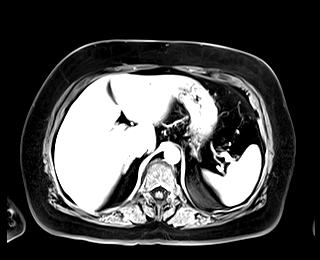
[im 72/72]
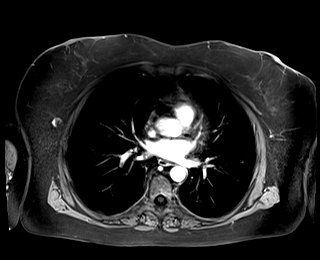

[Series 21: T1 dynamic fat-sat · axial · 3.0mm · 1.19mm/px · z∈[-95,+118]mm · 3 of 72 slices shown (3 of 5)]
[im 1/72]
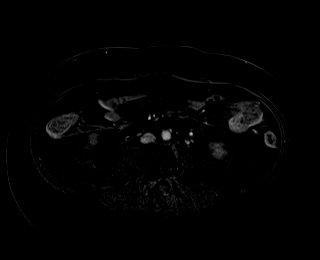
[im 36/72]
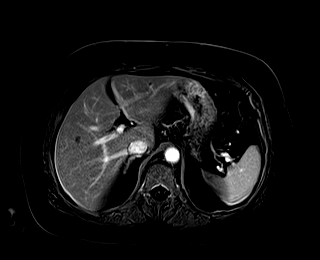
[im 72/72]
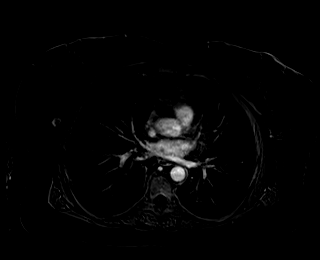

[Series 22: T1 dynamic fat-sat post-contrast · axial · 3.0mm · 1.19mm/px · z∈[-95,+118]mm · 3 of 72 slices shown (3 of 4)]
[im 1/72]
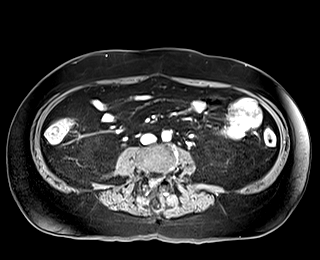
[im 36/72]
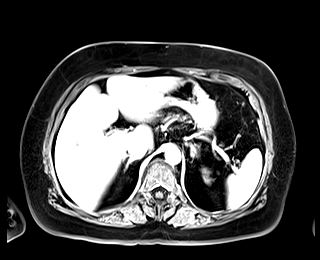
[im 72/72]
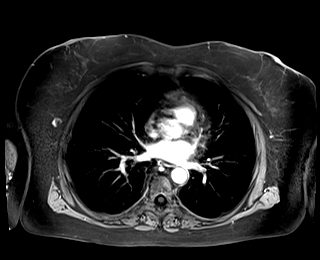

[Series 23: T1 dynamic fat-sat · axial · 3.0mm · 1.19mm/px · z∈[-95,+118]mm · 3 of 72 slices shown (4 of 5)]
[im 1/72]
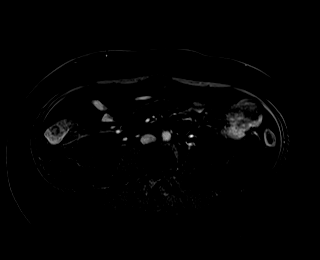
[im 36/72]
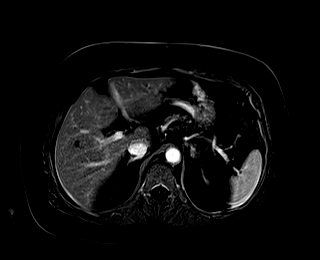
[im 72/72]
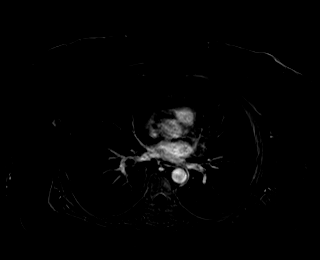

[Series 24: T1 dynamic post-contrast · coronal · 3.0mm · 1.31mm/px · 3 of 72 slices shown]
[im 1/72]
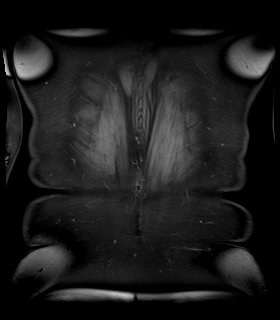
[im 36/72]
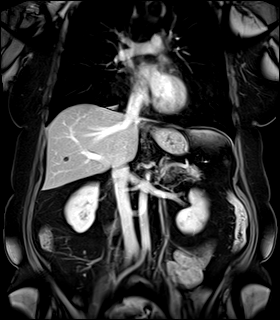
[im 72/72]
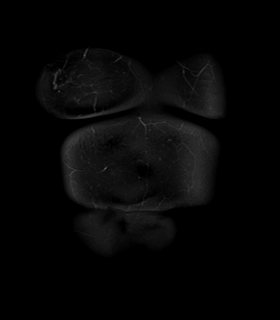

[Series 25: T1 dynamic fat-sat post-contrast · axial · 3.0mm · 1.19mm/px · z∈[-95,+118]mm · 3 of 72 slices shown (4 of 4)]
[im 1/72]
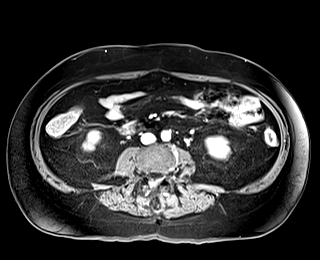
[im 36/72]
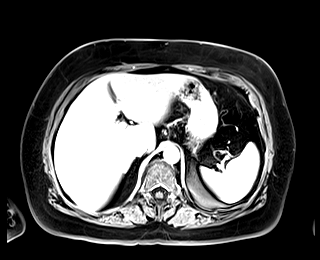
[im 72/72]
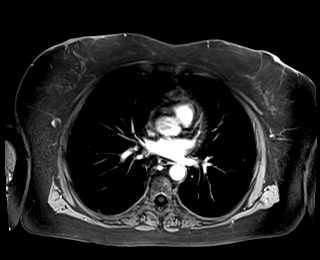

[Series 26: T1 dynamic fat-sat · axial · 3.0mm · 1.19mm/px · z∈[-95,+118]mm · 3 of 72 slices shown (5 of 5)]
[im 1/72]
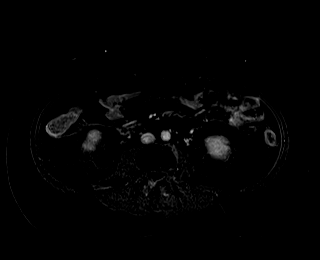
[im 36/72]
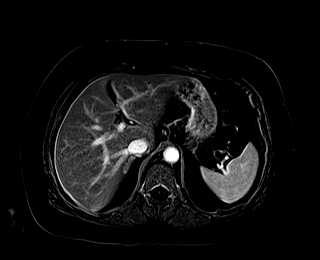
[im 72/72]
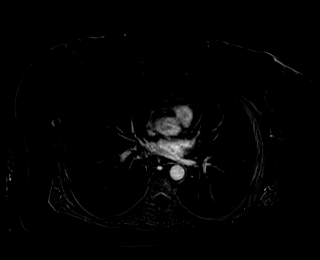

[44 of 48 positions shown; findings below may reference images not displayed]

FINDINGS: Lower chest: No acute abnormality.

Hepatobiliary: No significant hepatic steatosis. Interval resolution
of the cystic rim enhancing lesion in the anterior left lobe of the
liver, seen on prior MRI. Two well-circumscribed cystic hepatic
lesions in the right lobe of the liver measuring each measuring 1 cm
on image [DATE] and [DATE] respectively, these do not demonstrate
internal complexity, enhancement or adjacent parenchymal edema,
consistent with hepatic cysts. Additional tiny 3-4 mm cyst in the
left lobe of the liver on image [DATE] and right lobe of the liver on
image [DATE]. No solid enhancing hepatic lesions.

Gallbladder is surgically absent.  No biliary ductal dilation.

Pancreas:  Within normal limits.

Spleen: Nonenhancing 4 mm cystic lesion in the spleen, favored to
represent a benign cyst or lymphangioma.

Adrenals/Urinary Tract: Adrenal glands are unremarkable. No
hydronephrosis. Small bilateral renal cysts measuring up to 7 mm. No
solid enhancing renal masses.

Stomach/Bowel: Visualized portions within the abdomen are
unremarkable.

Vascular/Lymphatic: No pathologically enlarged lymph nodes
identified. No abdominal aortic aneurysm demonstrated.

Other:  No abdominal ascites.

Musculoskeletal: No suspicious bone lesions identified.
IMPRESSION: 1. Interval resolution of the complex cystic rim enhancing lesion in
the anterior left lobe of the liver, seen on prior MRI.
2. Additional benign bilobar hepatic cysts measuring 1 cm or
smaller.
3. Small bilateral renal cysts measuring up to 7 mm.

## 2023-03-03 ENCOUNTER — Other Ambulatory Visit: Payer: Self-pay | Admitting: Internal Medicine

## 2023-03-05 ENCOUNTER — Other Ambulatory Visit: Payer: Self-pay

## 2023-03-05 MED ORDER — POTASSIUM CHLORIDE CRYS ER 20 MEQ PO TBCR: 20.0000 meq | EXTENDED_RELEASE_TABLET | Freq: Two times a day (BID) | ORAL | 0 refills | Status: DC

## 2023-03-05 NOTE — Telephone Encounter (Signed)
Please contact pt for future appointment. Pt overdue for 12 month f/u. 

## 2023-03-20 ENCOUNTER — Ambulatory Visit: Payer: Medicare HMO | Attending: Medical | Admitting: Medical

## 2023-03-20 ENCOUNTER — Encounter: Payer: Self-pay | Admitting: Medical

## 2023-03-20 VITALS — BP 132/80 | HR 98 | Ht 61.0 in | Wt 156.0 lb

## 2023-03-20 DIAGNOSIS — I471 Supraventricular tachycardia, unspecified: Secondary | ICD-10-CM

## 2023-03-20 DIAGNOSIS — R000 Tachycardia, unspecified: Secondary | ICD-10-CM

## 2023-03-20 DIAGNOSIS — R079 Chest pain, unspecified: Secondary | ICD-10-CM | POA: Diagnosis not present

## 2023-03-20 DIAGNOSIS — I951 Orthostatic hypotension: Secondary | ICD-10-CM | POA: Diagnosis not present

## 2023-03-20 DIAGNOSIS — E782 Mixed hyperlipidemia: Secondary | ICD-10-CM | POA: Diagnosis not present

## 2023-03-20 MED ORDER — METOPROLOL SUCCINATE ER 50 MG PO TB24: 50.0000 mg | ORAL_TABLET | Freq: Every day | ORAL | 3 refills | Status: AC

## 2023-03-20 MED ORDER — ATORVASTATIN CALCIUM 80 MG PO TABS: 80.0000 mg | ORAL_TABLET | Freq: Every day | ORAL | 3 refills | Status: AC

## 2023-03-20 NOTE — Progress Notes (Signed)
Cardiology Office Note:    Date:  03/20/2023   ID:  Wanda [name], Wanda [name] [birth year], MRN [id number]  PCP:  Orene Desanctis, MD  [facility] Cardiologist:  Yvonne Kendall, MD  [facility] Electrophysiologist:  None   Referring MD: Orene Desanctis, MD   Chief Complaint: 1 year follow-up  History of Present Illness:    Wanda [name] is a 67 y.o. female with a hx of orthostatic hypotension, sinus tachycardia, HLD, anxiety, depression who presents for 6 month follow-up.    Zio monitor in 2022, ordered for exertional dyspnea and palpitations, showed NSR with rare ectopy, 4 atrial runs lasting up to 23.3 seconds, pSVT. Triggered events correlated with NSR with PVCs. Echo 10/28/20 showed EF 60-65% G1DD. Patient takes metoprolol for palpitations.   Patient was last seen July 2023 and was overall doing well on increased dose of Toprol.  Today, the patient is overall doing OK. She has had some chest discomfort, but she has been under stress at home. Chest discomfort starts when she feels anxious, worried and stressed. Its a tightness in the center of the chest. If she can calm herself down, the pain does not last that long. She denies SOB, LLE or palpitations.   Past Medical History:  Diagnosis Date   Anxiety    Basal cell carcinoma 05/03/2016   Left deltoid. Superficial.   Basal cell carcinoma 05/03/2016   Left distal lat. tricep. Nodular.   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 Laban Emperor Serial number: [id number] Model number: [id number]     Past Surgical History:  Procedure Laterality Date   ABDOMINAL HYSTERECTOMY     APPENDECTOMY     BACK SURGERY     X 3   BREAST EXCISIONAL BIOPSY Right    surgical bx age 72    CHOLECYSTECTOMY  09/2020   COLONOSCOPY N/A 08/28/2013   Procedure: COLONOSCOPY;  Surgeon: West Bali, MD;  [facility]: [facility];  Service: Endoscopy;  Laterality: N/A;  8:30 AM   ENDOSCOPIC RETROGRADE CHOLANGIOPANCREATOGRAPHY (ERCP) WITH PROPOFOL N/A 09/20/2020   Procedure: ENDOSCOPIC RETROGRADE CHOLANGIOPANCREATOGRAPHY (ERCP) WITH PROPOFOL;  Surgeon: Midge Minium, MD;  Location: [facility];  Service: Endoscopy;  Laterality: N/A;   INTRAMEDULLARY (IM) NAIL INTERTROCHANTERIC Left 05/30/2016   Procedure: INTRAMEDULLARY (IM) NAIL INTERTROCHANTRIC;  Surgeon: Juanell Fairly, MD;  Location: [facility] ORS;  Service: Orthopedics;  Laterality: Left;   SHOULDER SURGERY Right    Spinal cord stimulator     SPINAL CORD STIMULATOR REMOVAL N/A 01/12/2020   Procedure: REMOVAL SPINAL CORD STIMULATOR AND PULSE GENERATOR;  Surgeon: Lucy Chris, MD;  Location: [facility] ORS;  Service: Neurosurgery;  Laterality: N/A;  Local w/ MAC    Current Medications: Current Meds  Medication Sig   Cholecalciferol (  VITAMIN D3) 5000 units TABS Take 5,000 Units by mouth daily.   esomeprazole (NEXIUM) 40 MG capsule Take 40 mg by mouth daily.   famotidine (PEPCID) 40 MG tablet Take 40 mg by mouth at bedtime.   glycopyrrolate (ROBINUL) 1 MG tablet Take 1 tablet (1 mg total) by mouth daily.   hydrOXYzine (VISTARIL) 25 MG capsule Take 1 capsule (25 mg total) by mouth at bedtime as needed for anxiety.   Multiple Vitamin (MULTIVITAMIN) tablet Take 1 tablet by mouth daily.   naproxen (NAPROSYN) 250 MG tablet Take by mouth 2  (two) times daily with a meal.   potassium chloride SA (KLOR-CON M) 20 MEQ tablet Take 1 tablet (20 mEq total) by mouth 2 (two) times daily.   Vilazodone HCl (VIIBRYD) 40 MG TABS Take 1 tablet (40 mg total) by mouth daily.   [DISCONTINUED] atorvastatin (LIPITOR) 80 MG tablet TAKE 1 TABLET(80 MG) BY MOUTH DAILY IN THE EVENING   [DISCONTINUED] metoprolol succinate (TOPROL XL) 50 MG 24 hr tablet Take 1 tablet (50 mg total) by mouth daily. Take with or immediately following a meal.     Allergies:   Levofloxacin, Tramadol, Pantoprazole, Codeine, Mobic [meloxicam], and Propoxyphene hcl   Social History   Socioeconomic History   Marital status: Divorced    Spouse name: Not on file   Number of children: 2   Years of education: Not on file   Highest education level: High school graduate  Occupational History    Comment: disabled  Tobacco Use   Smoking status: Former    Current packs/day: 0.00    Average packs/day: 0.3 packs/day for 14.0 years (3.5 ttl pk-yrs)    Types: Cigarettes    Start date: 45    Quit date: 4    Years since quitting: 32.8   Smokeless tobacco: Never  Vaping Use   Vaping status: Never Used  Substance and Sexual Activity   Alcohol use: Yes    Comment: occasional; once every 6 months   Drug use: No   Sexual activity: Yes    Birth control/protection: Surgical  Other Topics Concern   Not on file  Social History Narrative   Not on file   Social Determinants of Health   Financial Resource Strain: Low Risk  (02/07/2023)   Received from [facility]   Overall Financial Resource Strain (CARDIA)    Difficulty of Paying Living Expenses: Not very hard  Food Insecurity: No Food Insecurity (02/07/2023)   Received from [facility]   Hunger Vital Sign    Worried About Running Out of Food in the Last Year: Never true    Ran Out of Food in the Last Year: Never true  Transportation Needs: No Transportation Needs (02/07/2023)   Received  [facility] - Transportation    In the past 12 months, has lack of transportation kept you from medical appointments or from getting medications?: No    Lack of Transportation (Non-Medical): No  Physical Activity: Inactive (06/25/2017)   Exercise Vital Sign    Days of Exercise per Week: 0 days    Minutes of Exercise per Session: 0 min  Stress: Stress Concern Present (06/25/2017)   [employer] of Occupational Health - Occupational Stress Questionnaire    Feeling of Stress : Very much  Social Connections: Somewhat Isolated (06/25/2017)   Social Connection and Isolation Panel [NHANES]    Frequency of Communication with Friends and Family: Twice a week  Frequency of Social Gatherings with Friends and Family: Never    Attends Religious Services: More than 4 times per year    Active Member of [employer] or Organizations: Yes    Attends Banker Meetings: Never    Marital Status: Divorced     Family History: The patient's family history includes Alcohol abuse in her daughter; Anxiety disorder in her daughter; Breast cancer in her paternal aunt; COPD in her father and mother; Cancer in her cousin and paternal aunt; Depression in her daughter; Diabetes in her cousin, father, mother, paternal grandmother, and sister; Drug abuse in her daughter and son; Heart attack (age of onset: 62) in her father; Heart disease in her father and mother; Pancreatic cancer in her sister; Tuberculosis in her maternal aunt. There is no history of Colon cancer or Sudden Cardiac Death.  ROS:   Please see the history of present illness.     All other systems reviewed and are negative.  EKGs/Labs/Other Studies Reviewed:    The following studies were reviewed today:  Echo 10/28/20  1. Left ventricular ejection fraction, by estimation, is 60 to 65%. The  left ventricle has normal function. The left ventricle has no regional  wall motion abnormalities. Left ventricular  diastolic parameters are  consistent with Grade I diastolic  dysfunction (impaired relaxation).   2. Right ventricular systolic function is normal. The right ventricular  size is normal. Tricuspid regurgitation signal is inadequate for assessing  PA pressure.   08/18/20 Cardiac monitor The patient was monitored for 14 days. The predominant rhythm was sinus with an average rate of 93 bpm (range 55-180 bpm). There were rare PAC's and occasional PVC's (1.1% PVC burden). Four atrial runs lasting up to 23.3 seconds occurred with a maximum rate of 174 bpm. No sustained arrhythmia or prolonged pause was identified. Patient triggered event corresponds to sinus rhythm with isolated PVC. Predominantly sinus rhythm with rare PAC's and occasional PVC's.  PSVT also noted (see details above).   Echo 05/2016 - Procedure narrative: Transthoracic echocardiography. The study    was technically difficult.  - Left ventricle: Systolic function was normal. The estimated    ejection fraction was in the range of 60% to 65%.  - Aortic valve: Valve area (Vmax): 2.13 cm^2.   Long term monitor 05/2016 The patient was monitored for 13 days, 20 hours. The predominant rhythm was sinus with an average rate of 84 bpm (range 52-170 bpm). Rare PACs and atrial couplets were identified. Rare isolated PVCs were also seen. There were 5 episodes of supraventricular tachycardia lasting up to 10.8 seconds with a maximal rate of 152 bpm. There were no sustained arrhythmias or prolonged pauses. There were no patient triggered episodes. Predominantly sinus rhythm with rare PACs and PVCs, as well as paroxysmal SVT.   Carotids 2018 IMPRESSION: Mild bilateral carotid atherosclerotic vascular disease. No flow limiting stenosis. Degree of stenosis less than 50% bilaterally. 2. Vertebral arteries are patent with antegrade flow.    EKG:  EKG is  ordered today.  The ekg ordered today demonstrates NSR 98bpm, nonspecific T wave  changes  Recent Labs: No results found for requested labs within last 365 days.  Recent Lipid Panel    Component Value Date/Time   CHOL 164 12/02/2020 0917   TRIG 262 (H) 12/02/2020 0917   HDL 48 12/02/2020 0917   CHOLHDL 3.4 12/02/2020 0917   CHOLHDL 5.0 09/29/2016 0731   VLDL 59 (H) 09/29/2016 0731   LDLCALC 74 12/02/2020 4010  LDLDIRECT 197.3 02/01/2010 0907    Physical Exam:    VS:  BP 132/80 (BP Location: Left Arm, Patient Position: Sitting, Cuff Size: Normal)   Pulse 98   Ht 5\' 1"  (1.549 m)   Wt 156 lb (70.8 kg)   LMP 09/30/1991 (Approximate) Comment: 1993  SpO2 95%   BMI 29.48 kg/m     Wt Readings from Last 3 Encounters:  03/20/23 156 lb (70.8 kg)  12/26/21 153 lb (69.4 kg)  07/21/21 150 lb (68 kg)     GEN:  Well nourished, well developed in no acute distress HEENT: Normal NECK: No JVD; No carotid bruits LYMPHATICS: No lymphadenopathy CARDIAC: RRR, no murmurs, rubs, gallops RESPIRATORY:  Clear to auscultation without rales, wheezing or rhonchi  ABDOMEN: Soft, non-tender, non-distended MUSCULOSKELETAL:  No edema; No deformity  SKIN: Warm and dry NEUROLOGIC:  Alert and oriented x 3 PSYCHIATRIC:  Normal affect   ASSESSMENT:    1. Chest pain of uncertain etiology   2. Sinus tachycardia   3. Paroxysmal SVT (supraventricular tachycardia) (HCC)   4. Hyperlipidemia, mixed   5. Orthostatic hypotension    PLAN:    In order of problems listed above:  Chest pain Patient describes atypical chest pain suspected from stressors at home.  She denies any shortness of breath or palpitations.  Pain is not exertional.  Last ischemic test was in 2018 with a normal Myoview Lexiscan.  Patient reports family history of CAD.  Patient also has other risk factors for CAD such as diabetes, hypertension, hyperlipidemia. EKG is stable today. After discussion, we agreed on monitoring symptoms.  We will see her back in 6 months to re-evaluate symptoms.  If she is still having chest  pain we will consider noninvasive ischemic testing.  Continue Lipitor and Toprol.  Inappropriate sinus tachycardia pSVT, PAC, PVCs Patient reports very well palpitations on Toprol.  Continue Toprol 50 mg daily. We will refill this.  Hyperlipidemia LDL 71, HDL 49, triglycerides 326.  Continue Lipitor 80 mg daily, I will refill this.  H/o orthostatic hypotension Blood pressure today is normal at 132/80.  Continue Toprol.  Disposition: Follow up in 6 month(s) with MD/APP    Signed, Devondre Guzzetta David Stall, PA-C  03/20/2023 12:30 PM    [facility]

## 2023-03-20 NOTE — Patient Instructions (Signed)

## 2023-03-31 ENCOUNTER — Other Ambulatory Visit: Payer: Self-pay | Admitting: Internal Medicine

## 2023-04-30 ENCOUNTER — Other Ambulatory Visit: Payer: Self-pay | Admitting: Dermatology

## 2023-04-30 DIAGNOSIS — R61 Generalized hyperhidrosis: Secondary | ICD-10-CM

## 2023-05-02 ENCOUNTER — Ambulatory Visit
Admission: RE | Admit: 2023-05-02 | Discharge: 2023-05-02 | Disposition: A | Discharge status: Home / Self Care | Payer: Medicare HMO | Source: Ambulatory Visit | Attending: Pediatrics | Admitting: Pediatrics

## 2023-05-02 DIAGNOSIS — M810 Age-related osteoporosis without current pathological fracture: Secondary | ICD-10-CM | POA: Diagnosis present

## 2023-06-07 ENCOUNTER — Ambulatory Visit (INDEPENDENT_AMBULATORY_CARE_PROVIDER_SITE_OTHER): Payer: 59 | Admitting: Psychiatry

## 2023-06-07 ENCOUNTER — Encounter: Payer: Self-pay | Admitting: Psychiatry

## 2023-06-07 VITALS — BP 122/82 | HR 112 | Temp 98.5°F | Ht 61.0 in | Wt 157.4 lb

## 2023-06-07 DIAGNOSIS — F3342 Major depressive disorder, recurrent, in full remission: Secondary | ICD-10-CM | POA: Diagnosis not present

## 2023-06-07 DIAGNOSIS — F4010 Social phobia, unspecified: Secondary | ICD-10-CM | POA: Diagnosis not present

## 2023-06-07 DIAGNOSIS — F5101 Primary insomnia: Secondary | ICD-10-CM | POA: Diagnosis not present

## 2023-06-07 DIAGNOSIS — F411 Generalized anxiety disorder: Secondary | ICD-10-CM

## 2023-06-07 DIAGNOSIS — F3341 Major depressive disorder, recurrent, in partial remission: Secondary | ICD-10-CM

## 2023-06-07 DIAGNOSIS — Z634 Disappearance and death of family member: Secondary | ICD-10-CM

## 2023-06-07 MED ORDER — VILAZODONE HCL 40 MG PO TABS: 40.0000 mg | ORAL_TABLET | Freq: Every day | ORAL | 1 refills | Status: DC

## 2023-06-07 MED ORDER — HYDROXYZINE PAMOATE 25 MG PO CAPS: 25.0000 mg | ORAL_CAPSULE | Freq: Every evening | ORAL | 5 refills | Status: DC | PRN

## 2023-06-07 NOTE — Progress Notes (Signed)
 [facility] MD OP Progress Note  06/07/2023 11:11 AM Wanda [name]  MRN:  [id number]  Chief Complaint:  Chief Complaint  Patient presents with   Follow-up   Medication Refill   Anxiety   Depression   HPI: Wanda [name] is a 68 year old Caucasian female, widowed, on SSI, lives in [place] has a history of GAD, MDD, social anxiety disorder, SVT, orthostatic hypotension, hyperlipidemia was evaluated in office today.  The patient presents with ongoing anxiety and depression, managed with current medications including hydroxyzine , Viibryd . She reports a recent increase in stress due to personal losses and the serious illness of a close friend. Despite these stressors, the patient denies any thoughts of self-harm or harm to others.  The patient has been taking hydroxyzine  nightly to aid sleep, but expresses concern about long-term use and potential side effects. She also takes melatonin, magnesium , and ashwagandha separately, which she hopes might eventually replace the need for hydroxyzine .  The patient also reports a physical discomfort, a recurring cramp in the leg where a rod was inserted following a hip fracture. The cramp has been worsening and occasionally disrupts her sleep.  In terms of social circumstances, the patient has experienced recent losses. She also expresses ongoing distress over a strained relationship with her granddaughter, which has been negatively impacted by family dynamics following the death of her daughter. The patient attempts to maintain contact with her granddaughter via text messages, but reports that responses are infrequent and brief. Despite these challenges, the patient is committed to maintaining an open line of communication with her granddaughter.  Patient currently denies any suicidality, homicidality or perceptual disturbances.  Visit Diagnosis:    ICD-10-CM   1. GAD (generalized anxiety disorder)  F41.1 hydrOXYzine  (VISTARIL ) 25 MG capsule    2. MDD (major  depressive disorder), recurrent, in full remission (HCC)  F33.42 Vilazodone  HCl (VIIBRYD ) 40 MG TABS    3. Social anxiety disorder  F40.10 hydrOXYzine  (VISTARIL ) 25 MG capsule    4. Primary insomnia  F51.01     5. Bereavement  Z63.4       Past Psychiatric History: I have reviewed past psychiatric history from progress note on 08/21/2017.  Past trials of Zoloft , Cymbalta, Effexor, Paxil, Prozac, Wellbutrin , prazosin , Seroquel , trazodone , doxepin , temazepam .  Past Medical History:  Past Medical History:  Diagnosis Date   Anxiety    Basal cell carcinoma 05/03/2016   Left deltoid. Superficial.   Basal cell carcinoma 05/03/2016   Left distal lat. tricep. Nodular.   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 Tanya Serial number: [id number] Model number: [id number]     Past Surgical History:  Procedure Laterality Date   ABDOMINAL HYSTERECTOMY     APPENDECTOMY     BACK SURGERY  X 3   BREAST EXCISIONAL BIOPSY Right    surgical bx age 24    CHOLECYSTECTOMY  09/2020   COLONOSCOPY N/A 08/28/2013   Procedure: COLONOSCOPY;  Surgeon: Margo LITTIE Haddock, MD;  Location[facility];  Service: Endoscopy;  Laterality: N/A;  8:30 AM   ENDOSCOPIC RETROGRADE CHOLANGIOPANCREATOGRAPHY (ERCP) WITH PROPOFOL  N/A 09/20/2020    Procedure: ENDOSCOPIC RETROGRADE CHOLANGIOPANCREATOGRAPHY (ERCP) WITH PROPOFOL ;  Surgeon: Jinny Carmine, MD;  Location: [facility];  Service: Endoscopy;  Laterality: N/A;   INTRAMEDULLARY (IM) NAIL INTERTROCHANTERIC Left 05/30/2016   Procedure: INTRAMEDULLARY (IM) NAIL INTERTROCHANTRIC;  Surgeon: Franky Cranker, MD;  Location: [facility] OR[facility];  Service: Orthopedics;  Laterality: Left;   SHOULDER SURGERY Right    Spinal cord stimulator     SPINAL CORD STIMULATOR REMOVAL N/A 01/12/2020   Procedure: REMOVAL SPINAL CORD STIMULATOR AND PULSE GENERATOR;  Surgeon: Bluford Standing, MD;  Location: [facility] OR[facility];  Service: Neurosurgery;  Laterality: N/A;  Local w/ MAC    Family Psychiatric History: I have reviewed family psychiatric history from progress note on 08/21/2017.  Family History:  Family History  Problem Relation Age of Onset   Diabetes Mother    COPD Mother    Heart disease Mother    Diabetes Father    COPD Father    Heart disease Father    Heart attack Father 73       CABG   Diabetes Sister    Pancreatic cancer Sister    Tuberculosis Maternal Aunt    Cancer Paternal Aunt        breast   Breast cancer Paternal Aunt    Diabetes Paternal Grandmother    Cancer Cousin    Diabetes Cousin    Alcohol abuse Daughter    Depression Daughter    Drug abuse Daughter    Anxiety disorder Daughter    Drug abuse Son    Colon cancer Neg Hx    Sudden Cardiac Death Neg Hx     Social History: I have reviewed social history from progress note on 08/21/2017. Social History   Socioeconomic History   Marital status: Divorced    Spouse name: Not on file   Number of children: 2   Years of education: Not on file   Highest education level: High school graduate  Occupational History    Comment: disabled  Tobacco Use   Smoking status: Former    Current packs/day: 0.00    Average packs/day: 0.3 packs/day for 14.0 years (3.5 ttl pk-yrs)    Types: Cigarettes    Start date: 69    Quit date: 79    Years  since quitting: 33.0   Smokeless tobacco: Never  Vaping Use   Vaping status: Never Used  Substance and Sexual Activity   Alcohol use: Yes    Comment: occasional; once every 6 months   Drug use: No   Sexual activity: Yes    Birth control/protection: Surgical  Other Topics Concern   Not on file  Social History Narrative   Not on file   Social Drivers of Health   Financial Resource Strain: Low Risk  (02/07/2023)   Received from [facility]   Overall Financial Resource Strain (CARDIA)    Difficulty of Paying Living Expenses: Not very hard  Food Insecurity: No Food Insecurity (02/07/2023)   Received from [facility]   Hunger Vital Sign    Worried About Running Out of Food in the Last Year: Never true    Ran Out  of Food in the Last Year: Never true  Transportation Needs: No Transportation Needs (02/07/2023)   Received from [facility] - Transportation    In the past 12 months, has lack of transportation kept you from medical appointments or from getting medications?: No    Lack of Transportation (Non-Medical): No  Physical Activity: Inactive (06/25/2017)   Exercise Vital Sign    Days of Exercise per Week: 0 days    Minutes of Exercise per Session: 0 min  Stress: Stress Concern Present (06/25/2017)   [employer] of Occupational Health - Occupational Stress Questionnaire    Feeling of Stress : Very much  Social Connections: Somewhat Isolated (06/25/2017)   Social Connection and Isolation Panel [NHANES]    Frequency of Communication with Friends and Family: Twice a week    Frequency of Social Gatherings with Friends and Family: Never    Attends Religious Services: More than 4 times per year    Active Member of [employer] or Organizations: Yes    Attends Banker Meetings: Never    Marital Status: Divorced    Allergies:  Allergies  Allergen Reactions   Levofloxacin Hives and Nausea And Vomiting   Tramadol  Other (See Comments)    Severe headache   Pantoprazole  Itching   Codeine Nausea Only    Slight nausea   Mobic [Meloxicam] Other (See Comments)    Calf tightness   Propoxyphene Hcl Rash    Metabolic Disorder Labs: Lab Results  Component Value Date   HGBA1C 5.4 05/29/2016   MPG 108 05/29/2016   No results found for: PROLACTIN Lab Results  Component Value Date   CHOL 164 12/02/2020   TRIG 262 (H) 12/02/2020   HDL 48 12/02/2020   CHOLHDL 3.4 12/02/2020   VLDL 59 (H) 09/29/2016   LDLCALC 74 12/02/2020   LDLCALC 78 07/08/2018   Lab Results  Component Value Date   TSH 3.700 09/19/2020   TSH 0.932 07/14/2020    Therapeutic Level Labs: No results found for: LITHIUM No results found for: VALPROATE No results found for: CBMZ  Current Medications: Current Outpatient Medications  Medication Sig Dispense Refill   atorvastatin  (LIPITOR) 80 MG tablet Take 1 tablet (80 mg total) by mouth daily. 90 tablet 3   Cholecalciferol (VITAMIN D3) 5000 units TABS Take 5,000 Units by mouth daily.     esomeprazole (NEXIUM) 40 MG capsule Take 40 mg by mouth daily.     famotidine  (PEPCID ) 40 MG tablet Take 40 mg by mouth at bedtime.     glycopyrrolate  (ROBINUL ) 1 MG tablet TAKE 1 TABLET(1 MG) BY MOUTH DAILY 30 tablet 6   metoprolol  succinate (TOPROL  XL) 50 MG 24 hr tablet Take 1 tablet (50 mg total) by mouth daily. Take with or immediately following a meal. 90 tablet 3   Multiple Vitamin (MULTIVITAMIN) tablet Take 1 tablet by mouth daily.     naproxen  (NAPROSYN ) 250 MG tablet Take by mouth 2 (two) times daily with a meal.     potassium chloride  SA (KLOR-CON  M) 20 MEQ tablet TAKE 1 TABLET(20 MEQ) BY MOUTH TWICE DAILY 60 tablet 5   hydrOXYzine  (VISTARIL ) 25 MG capsule Take 1 capsule (25 mg total) by mouth at bedtime as needed for anxiety. 30 capsule 5   Vilazodone  HCl (VIIBRYD ) 40 MG TABS Take 1 tablet (40 mg total) by mouth daily. 90 tablet 1   No current facility-administered medications  for this visit.     Musculoskeletal: Strength &  Muscle Tone: within normal limits Gait & Station: normal Patient leans: N/A  Psychiatric Specialty Exam: Review of Systems  Psychiatric/Behavioral:  The patient is nervous/anxious.     Blood pressure 122/82, pulse (!) 112, temperature 98.5 F (36.9 C), temperature source Temporal, height 5' 1 (1.549 m), weight 157 lb 6.4 oz (71.4 kg), last menstrual period 09/30/1991, SpO2 94%.Body mass index is 29.74 kg/m.  General Appearance: Fairly Groomed  Eye Contact:  Fair  Speech:  Clear and Coherent  Volume:  Normal  Mood:  Anxious  Affect:  Congruent  Thought Process:  Goal Directed and Descriptions of Associations: Intact  Orientation:  Full (Time, Place, and Person)  Thought Content: Logical   Suicidal Thoughts:  No  Homicidal Thoughts:  No  Memory:  Immediate;   Fair Recent;   Fair Remote;   Fair  Judgement:  Fair  Insight:  Fair  Psychomotor Activity:  Normal  Concentration:  Concentration: Fair and Attention Span: Fair  Recall:  [employer] of Knowledge: Fair  Language: Fair  Akathisia:  No  Handed:  Right  AIMS (if indicated): not done  Assets:  Communication Skills Desire for Improvement Housing Social Support  ADL's:  Intact  Cognition: WNL  Sleep:  Fair   Screenings: Geneticist, Molecular Office Visit from 01/27/2022 in [facility]  AIMS Total Score 0      GAD-7    Flowsheet Row Office Visit from 06/07/2023 in [facility] Office Visit from 12/07/2022 in [facility] Office Visit from 07/31/2022 in [facility] Office Visit from 06/02/2022 in [facility] Counselor from 03/24/2022 in [facility]  Total GAD-7 Score 3 1 2 1 9       PHQ2-9    Flowsheet Row Office Visit from  06/07/2023 in [facility] Counselor from 12/25/2022 in [facility] at [facility] Visit from 12/07/2022 in [facility] Office Visit from 07/31/2022 in [facility] Office Visit from 06/02/2022 in [facility]  PHQ-2 Total Score 0 1 0 1 0  PHQ-9 Total Score -- -- 1 4 0      Flowsheet Row Office Visit from 06/07/2023 in [facility] Counselor from 12/25/2022 in [facility] at [facility] Visit from 12/07/2022 in [facility]  C-SSRS RISK CATEGORY No Risk No Risk No Risk        Assessment and Plan: Wanda [name] is a 68 year old Caucasian female with GAD, MDD, insomnia, currently presents with worsening anxiety due to recent situational stressors, discussed referral for therapy, discussed assessment and plan as noted below.  Generalized Anxiety Disorder-unstable Ongoing anxiety exacerbated by recent stressors, including severe illness and death of close friends. Currently taking hydroxyzine  25 mg at night, melatonin, magnesium , and ashwagandha. Discussed risks of long-term hydroxyzine  use and potential alternatives. Advised to continue current regimen due to high stress levels.   - Continue hydroxyzine  25 mg at night as needed, 30-day supply   - Discuss potential reduction of hydroxyzine  when stress decreases   - Consider melatonin combinations with ashwagandha and magnesium    - Refer to in-house therapist for counseling    Depression-in remission On Viibryd  (vilazodone ) 40 mg with no changes in depressive symptoms. No  thoughts of self-harm or harm to others.   - Continue Viibryd  (vilazodone ) 40 mg   - Follow up in six months    Social anxiety disorder-stable Currently denies any  concerns. -Continue current medication regimen and referred for therapy.  Insomnia-stable Continue sleep hygiene techniques - Currently uses melatonin, ashwagandha and magnesium  over-the-counter. - Continue hydroxyzine  25 mg at bedtime as needed.  Follow-up   - Schedule follow-up appointment in six months     Collaboration of Care: Collaboration of Care: Referral or follow-up with counselor/therapist AEB patient referred for in-house therapy with our therapist.  Patient/Guardian was advised Release of Information must be obtained prior to any record release in order to collaborate their care with an outside provider. Patient/Guardian was advised if they have not already done so to contact the registration department to sign all necessary forms in order for us  to release information regarding their care.   Consent: Patient/Guardian gives verbal consent for treatment and assignment of benefits for services provided during this visit. Patient/Guardian expressed understanding and agreed to proceed.   This note was generated in part or whole with voice recognition software. Voice recognition is usually quite accurate but there are transcription errors that can and very often do occur. I apologize for any typographical errors that were not detected and corrected.    Melroy Bougher, MD 06/08/2023, 8:08 AM

## 2023-07-03 ENCOUNTER — Ambulatory Visit (INDEPENDENT_AMBULATORY_CARE_PROVIDER_SITE_OTHER): Payer: 59 | Admitting: Professional Counselor

## 2023-07-03 DIAGNOSIS — F4381 Prolonged grief disorder: Secondary | ICD-10-CM | POA: Diagnosis not present
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
## 2023-07-17 ENCOUNTER — Ambulatory Visit (INDEPENDENT_AMBULATORY_CARE_PROVIDER_SITE_OTHER): Payer: 59 | Admitting: Professional Counselor

## 2023-07-17 DIAGNOSIS — F411 Generalized anxiety disorder: Secondary | ICD-10-CM

## 2023-07-17 NOTE — Progress Notes (Signed)
   THERAPIST PROGRESS NOTE  Session Time: 2:00 PM - 2:53 PM   Participation Level: Active  Behavioral Response: Well GroomedAlertEuthymic  Type of Therapy: Individual Therapy  Treatment Goals addressed: Active Anxiety  LTG: "I know I distract myself when I over think. I do a lot of over-thinking, but I'm always trying to rationalize."    Start:  07/17/23    Expected End:  07/15/24     STG: "If I don't take it, I can't sleep." To improve sleep hygiene AEB creating a routine and using grounding mechanisms to maintain healthy sleeping patterns over the next 12 weeks.    STG: To reduce the impact of grief/loss AEB processing events, sharing balanced thinking and emotional responses, and coming to a place of acceptance over the next 12 weeks.    ProgressTowards Goals: Initial  Interventions: Motivational Interviewing  Summary: Wanda [name] is a 68 y.o. female who presents with a history of anxiety, depression, grief, and PTSD. Wanda [name] appeared alert and oriented x5. She was neatly dressed and well-groomed. She stated she is doing well overall. She noted concepts she learned in prior therapy experiences and feels as though she is managing things very well. [name] was unsure what goals to set for therapy this time. She engaged in discussion and was receptive to creating goals to address ongoing anxiety (over-thinking), struggles with sleep that require medication, and processing trauma/grief. She expressed understanding about homework assignment.   Therapist Response: Conducted session with [name]. Began session with check-in/update since previous session. Utilized empathetic and reflective listening. Developed treatment plan with input from [place] on current strengths, needs, and progress towards goals. Used open-ended questions to facilitate discussion and assisted with identifying areas we could work on in future sessions. Assigned homework to write impact statement on most traumatic event in her  life. Confirmed next session, scheduled additional appointment, and concluded session.   Suicidal/Homicidal: No  Plan: Return again in 1 weeks.  Diagnosis: GAD (generalized anxiety disorder)  Collaboration of Care: Medication Management AEB chart review  Patient/Guardian was advised Release of Information must be obtained prior to any record release in order to collaborate their care with an outside provider. Patient/Guardian was advised if they have not already done so to contact the registration department to sign all necessary forms in order for [place] to release information regarding their care.   Consent: Patient/Guardian gives verbal consent for treatment and assignment of benefits for services provided during this visit. Patient/Guardian expressed understanding and agreed to proceed.   [name], [facility] 07/17/2023

## 2023-07-24 ENCOUNTER — Ambulatory Visit (INDEPENDENT_AMBULATORY_CARE_PROVIDER_SITE_OTHER): Payer: 59 | Admitting: Professional Counselor

## 2023-07-24 DIAGNOSIS — F4381 Prolonged grief disorder: Secondary | ICD-10-CM | POA: Diagnosis not present

## 2023-07-24 NOTE — Progress Notes (Unsigned)
  THERAPIST PROGRESS NOTE  Session Time: 1:00 PM - 1:55 PM  Participation Level: Active  Behavioral Response: Casual, Alert, Dysphoric  Type of Therapy: Individual Therapy  Treatment Goals addressed: Active Anxiety  LTG: "I know I distract myself when I over think. I do a lot of over-thinking, but I'm always trying to rationalize."                Start:  07/17/23    Expected End:  07/15/24      STG: "If I don't take it, I can't sleep." To improve sleep hygiene AEB creating a routine and using grounding mechanisms to maintain healthy sleeping patterns over the next 12 weeks.     STG: To reduce the impact of grief/loss AEB processing events, sharing balanced thinking and emotional responses, and coming to a place of acceptance over the next 12 weeks.    ProgressTowards Goals: Progressing  Interventions: CBT  Summary: Wanda [name] is a 68 y.o. female who presents with a history of anxiety and depression. [name] appeared alert and oriented x5. She was causally dressed and well-groomed. She read her impact statement which covered multiple traumatic events in her life. [name] was tearful as she focused the discussion on the deaths of her daughter and grandson. She was receptive to inquiry and noted she hadn't thought about how her anger towards her son-in-law might be avoiding her grief. She will continue to process her thoughts/feelings until next week.   Suicidal/Homicidal: No  Therapist Response: Conducted session with Wanda [name]. Began session with check-in/update since previous session. Utilized empathetic and reflective listening. Actively listened as Brady shared her impact statement. Used open-ended questions to facilitate discussion. Inquired how is [name]'s anger towards her son-in-law avoiding grief over her daughter and grandson's death. Encouraged [name] to continue processing her thoughts/feelings until next session.  Plan: Return again in 1 week.  Diagnosis: Complex grief disorder lasting  longer than 12 months  Collaboration of Care: Medication Management AEB chart review  Patient/Guardian was advised Release of Information must be obtained prior to any record release in order to collaborate their care with an outside provider. Patient/Guardian was advised if they have not already done so to contact the registration department to sign all necessary forms in order for [place] to release information regarding their care.   Consent: Patient/Guardian gives verbal consent for treatment and assignment of benefits for services provided during this visit. Patient/Guardian expressed understanding and agreed to proceed.   [name], [facility] 07/25/2023

## 2023-07-31 ENCOUNTER — Ambulatory Visit (INDEPENDENT_AMBULATORY_CARE_PROVIDER_SITE_OTHER): Payer: 59 | Admitting: Professional Counselor

## 2023-07-31 DIAGNOSIS — F4381 Prolonged grief disorder: Secondary | ICD-10-CM | POA: Diagnosis not present

## 2023-07-31 NOTE — Progress Notes (Unsigned)
  THERAPIST PROGRESS NOTE  Session Time: 1:00 PM - 1:58 PM  Participation Level: {BHH PARTICIPATION LEVEL:22264}  Behavioral Response: {Appearance:22683}{BHH LEVEL OF CONSCIOUSNESS:22305}{BHH MOOD:22306}  Type of Therapy: {CHL AMB BH Type of Therapy:21022741}  Treatment Goals addressed: ***  ProgressTowards Goals: {Progress Towards Goals:21014066}  Interventions: {CHL AMB BH Type of Intervention:21022753}  Summary: Wanda [name] is a 68 y.o. female who presents with ***. Read letter to daughter, brought up a lot of emotions/tears, still avoided some things  Suicidal/Homicidal: {BHH YES OR NO:22294}{yes/no/with/without intent/plan:22693}  Therapist Response: *** control, Inquired about being a good mom, used Socratic questioning/check the facts to challenge this thinking, encouraged more writing of those things avoided  Plan: Return again in *** weeks.  Diagnosis: No diagnosis found.  Collaboration of Care: {BH OP Collaboration of Care:21014065}  Patient/Guardian was advised Release of Information must be obtained prior to any record release in order to collaborate their care with an outside provider. Patient/Guardian was advised if they have not already done so to contact the registration department to sign all necessary forms in order for [place] to release information regarding their care.   Consent: Patient/Guardian gives verbal consent for treatment and assignment of benefits for services provided during this visit. Patient/Guardian expressed understanding and agreed to proceed.   [name], [facility] 07/31/2023

## 2023-08-02 ENCOUNTER — Ambulatory Visit
Admission: RE | Admit: 2023-08-02 | Discharge: 2023-08-02 | Disposition: A | Discharge status: Home / Self Care | Attending: Pediatrics | Admitting: Pediatrics

## 2023-08-02 ENCOUNTER — Ambulatory Visit
Admission: RE | Admit: 2023-08-02 | Discharge: 2023-08-02 | Disposition: A | Discharge status: Home / Self Care | Source: Ambulatory Visit | Attending: Pediatrics | Admitting: Pediatrics

## 2023-08-02 ENCOUNTER — Other Ambulatory Visit: Payer: Self-pay | Admitting: Pediatrics

## 2023-08-02 DIAGNOSIS — M545 Low back pain, unspecified: Secondary | ICD-10-CM

## 2023-08-16 ENCOUNTER — Ambulatory Visit (INDEPENDENT_AMBULATORY_CARE_PROVIDER_SITE_OTHER): Payer: 59 | Admitting: Professional Counselor

## 2023-08-16 DIAGNOSIS — F411 Generalized anxiety disorder: Secondary | ICD-10-CM

## 2023-08-16 DIAGNOSIS — F4381 Prolonged grief disorder: Secondary | ICD-10-CM

## 2023-08-16 NOTE — Progress Notes (Signed)
  THERAPIST PROGRESS NOTE  Session Time: 2:00 PM - 2:35 PM  Participation Level: Active  Behavioral Response: Well Groomed, Alert,  Hopeful  Type of Therapy: Individual Therapy  Treatment Goals addressed: Active Anxiety  LTG: "I know I distract myself when I over think. I do a lot of over-thinking, but I'm always trying to rationalize."                Start:  07/17/23    Expected End:  07/15/24      STG: "If I don't take it, I can't sleep." To improve sleep hygiene AEB creating a routine and using grounding mechanisms to maintain healthy sleeping patterns over the next 12 weeks.     STG: To reduce the impact of grief/loss AEB processing events, sharing balanced thinking and emotional responses, and coming to a place of acceptance over the next 12 weeks.    ProgressTowards Goals: Progressing  Interventions: Motivational Interviewing and Supportive  Summary: Wanda [name] is a 68 y.o. female who presents with a history of anxiety, depression, and complex grief disorder. She appeared alert and oriented x5. She stated she had good news to share. [name] met with some church members, one in particular, that provided some scripture and insight on forgiveness and moving forward. She reported it was similar to what we had been discussing in session. She felt as though she has decided to let things go and move forward with her life. [name] reported it doesn't help to focus on things outside of her control, like the past. She noted some issues still with sleep due to her mind racing. She was receptive to some coping skills to help. [name] was in agreement to schedule a month out and work towards successful discharge due to her progression in therapy.   Therapist Response: Conducted session with Wanda [name]. Began session with check-in/update since previous session. Utilized empathetic and reflective listening. Summarized [name]'s thoughts/feelings. Provided coping skills to assist with sleep hygiene (breathing  exercises, TIP, 5-4-3-2-1, guided meditations, audio bedtime stories, natural supplements/teas, and thought dumping). Discussed a monthly maintenance appointment and possible discharge. Scheduled next appointment and concluded session.   Suicidal/Homicidal: No  Plan: Return again in 5 weeks.  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 Patient/Guardian was advised if they have not already done so to contact the registration department to sign all necessary forms in order for [place] to release information regarding their care.   Consent: Patient/Guardian gives verbal consent for treatment and assignment of benefits for services provided during this visit. Patient/Guardian expressed understanding and agreed to proceed.   [name], [facility] 08/16/2023

## 2023-08-30 ENCOUNTER — Ambulatory Visit: Admitting: Dermatology

## 2023-08-30 ENCOUNTER — Encounter: Payer: Self-pay | Admitting: Dermatology

## 2023-08-30 DIAGNOSIS — W908XXA Exposure to other nonionizing radiation, initial encounter: Secondary | ICD-10-CM | POA: Diagnosis not present

## 2023-08-30 DIAGNOSIS — Z1283 Encounter for screening for malignant neoplasm of skin: Secondary | ICD-10-CM

## 2023-08-30 DIAGNOSIS — R61 Generalized hyperhidrosis: Secondary | ICD-10-CM | POA: Diagnosis not present

## 2023-08-30 DIAGNOSIS — L570 Actinic keratosis: Secondary | ICD-10-CM

## 2023-08-30 DIAGNOSIS — Z85828 Personal history of other malignant neoplasm of skin: Secondary | ICD-10-CM

## 2023-08-30 DIAGNOSIS — L821 Other seborrheic keratosis: Secondary | ICD-10-CM

## 2023-08-30 DIAGNOSIS — L814 Other melanin hyperpigmentation: Secondary | ICD-10-CM

## 2023-08-30 DIAGNOSIS — D229 Melanocytic nevi, unspecified: Secondary | ICD-10-CM

## 2023-08-30 DIAGNOSIS — L820 Inflamed seborrheic keratosis: Secondary | ICD-10-CM

## 2023-08-30 DIAGNOSIS — L578 Other skin changes due to chronic exposure to nonionizing radiation: Secondary | ICD-10-CM

## 2023-08-30 DIAGNOSIS — D1801 Hemangioma of skin and subcutaneous tissue: Secondary | ICD-10-CM

## 2023-08-30 DIAGNOSIS — Z79899 Other long term (current) drug therapy: Secondary | ICD-10-CM

## 2023-08-30 DIAGNOSIS — Z7189 Other specified counseling: Secondary | ICD-10-CM

## 2023-08-30 MED ORDER — GLYCOPYRROLATE 1 MG PO TABS: ORAL_TABLET | ORAL | 11 refills | Status: DC

## 2023-08-30 NOTE — Progress Notes (Signed)
 Follow-Up Visit   Subjective  Wanda [name] is a 68 y.o. female who presents for the following: Skin Cancer Screening and Upper Body Skin Exam  The patient presents for Upper Body Skin Exam (UBSE) for skin cancer screening and mole check. The patient has spots, moles and lesions to be evaluated, some may be new or changing and the patient may have concern these could be cancer.  Pt defers TBSE.   The following portions of the chart were reviewed this encounter and updated as appropriate: medications, allergies, medical history  Review of Systems:  No other skin or systemic complaints except as noted in HPI or Assessment and Plan.  Objective  Well appearing patient in no apparent distress; mood and affect are within normal limits.  All skin waist up examined. Relevant physical exam findings are noted in the Assessment and Plan. R forehead x 3 (3) Erythematous thin papules/macules with gritty scale.  L temple x 1, L shoulder x 1, R breast x 1, face x 3 (6) Erythematous stuck-on, waxy papule or plaque  Assessment & Plan  AK (ACTINIC KERATOSIS) (3) R forehead x 3 (3) Actinic keratoses are precancerous spots that appear secondary to cumulative UV radiation exposure/sun exposure over time. They are chronic with expected duration over 1 year. A portion of actinic keratoses will progress to squamous cell carcinoma of the skin. It is not possible to reliably predict which spots will progress to skin cancer and so treatment is recommended to prevent development of skin cancer.  Recommend daily broad spectrum sunscreen SPF 30+ to sun-exposed areas, reapply every 2 hours as needed.  Recommend staying in the shade or wearing long sleeves, sun glasses (UVA+UVB protection) and wide brim hats (4-inch brim around the entire circumference of the hat). Call for new or changing lesions.  Destruction of lesion - R forehead x 3 (3) Complexity: simple   Destruction method: cryotherapy   Informed  consent: discussed and consent obtained   Timeout:  patient name, date of birth, surgical site, and procedure verified Lesion destroyed using liquid nitrogen: Yes   Region frozen until ice ball extended beyond lesion: Yes   Outcome: patient tolerated procedure well with no complications   Post-procedure details: wound care instructions given   INFLAMED SEBORRHEIC KERATOSIS (6) L temple x 1, L shoulder x 1, R breast x 1, face x 3 (6) Symptomatic, irritating, patient would like treated.  Destruction of lesion - L temple x 1, L shoulder x 1, R breast x 1, face x 3 (6) Complexity: simple   Destruction method: cryotherapy   Informed consent: discussed and consent obtained   Timeout:  patient name, date of birth, surgical site, and procedure verified Lesion destroyed using liquid nitrogen: Yes   Region frozen until ice ball extended beyond lesion: Yes   Outcome: patient tolerated procedure well with no complications   Post-procedure details: wound care instructions given   HYPERHIDROSIS   Related Medications glycopyrrolate (ROBINUL) 1 MG tablet Take 2 tabs po QD for excessive sweating.  Actinic Damage - Chronic condition, secondary to cumulative UV/sun exposure - diffuse scaly erythematous macules with underlying dyspigmentation - Recommend daily broad spectrum sunscreen SPF 30+ to sun-exposed areas, reapply every 2 hours as needed.  - Staying in the shade or wearing long sleeves, sun glasses (UVA+UVB protection) and wide brim hats (4-inch brim around the entire circumference of the hat) are also recommended for sun protection.  - Call for new or changing lesions.  Lentigines, Seborrheic Keratoses,  Hemangiomas - Benign normal skin lesions - Benign-appearing - Call for any changes  Melanocytic Nevi - Tan-brown and/or pink-flesh-colored symmetric macules and papules - Benign appearing on exam today - Observation - Call clinic for new or changing moles - Recommend daily use of broad  spectrum spf 30+ sunscreen to sun-exposed areas.   HISTORY OF BASAL CELL CARCINOMA OF THE SKIN - No evidence of recurrence today - Recommend regular full body skin exams - Recommend daily broad spectrum sunscreen SPF 30+ to sun-exposed areas, reapply every 2 hours as needed.  - Call if any new or changing lesions are noted between office visits  Hyperhidrosis Location: B/L palms and soles, axillae, inframammary folds, inguinal creases Chronic and persistent condition with duration or expected duration over one year. Condition is bothersome/symptomatic for patient.  Patient says she improves with the treatment but it is persistent. Glycopyrrolate can significantly increase the risk of heat stroke so you should avoid using it in the heat, particularly while active. It can also cause dry mouth, blurred vision, difficulty with urination, headache, constipation, and racing heart. Take it only as directed. Never take more than 8 tablets total per day.   Continue Robinul 1 mg qd. May increase to 2 or 3 tabs daily as needed.   Long term medication management.  Patient is using long term (months to years) prescription medication  to control their dermatologic condition.  These medications require periodic monitoring to evaluate for efficacy and side effects and may require periodic laboratory monitoring.  Skin cancer screening performed today.  Return in about 1 year (around 08/29/2024) for TBSE.  Maylene Roes, CMA, am acting as scribe for Armida Sans, MD .  Documentation: I have reviewed the above documentation for accuracy and completeness, and I agree with the above.  Armida Sans, MD

## 2023-08-30 NOTE — Patient Instructions (Signed)

## 2023-08-30 NOTE — Progress Notes (Deleted)
   Follow-Up Visit   Subjective  Wanda [name] is a 68 y.o. female who presents for the following: Skin Cancer Screening and Full Body Skin Exam  The patient presents for Total-Body Skin Exam (TBSE) for skin cancer screening and mole check. The patient has spots, moles and lesions to be evaluated, some may be new or changing and the patient may have concern these could be cancer.  Pt has noticed an irregular skin lesion on the R breast and back that she would like checked today.  The following portions of the chart were reviewed this encounter and updated as appropriate: medications, allergies, medical history  Review of Systems:  No other skin or systemic complaints except as noted in HPI or Assessment and Plan.  Objective  Well appearing patient in no apparent distress; mood and affect are within normal limits.  A full examination was performed including scalp, head, eyes, ears, nose, lips, neck, chest, axillae, abdomen, back, buttocks, bilateral upper extremities, bilateral lower extremities, hands, feet, fingers, toes, fingernails, and toenails. All findings within normal limits unless otherwise noted below.   Relevant physical exam findings are noted in the Assessment and Plan.    Assessment & Plan   SKIN CANCER SCREENING PERFORMED TODAY.  ACTINIC DAMAGE - Chronic condition, secondary to cumulative UV/sun exposure - diffuse scaly erythematous macules with underlying dyspigmentation - Recommend daily broad spectrum sunscreen SPF 30+ to sun-exposed areas, reapply every 2 hours as needed.  - Staying in the shade or wearing long sleeves, sun glasses (UVA+UVB protection) and wide brim hats (4-inch brim around the entire circumference of the hat) are also recommended for sun protection.  - Call for new or changing lesions.  LENTIGINES, SEBORRHEIC KERATOSES, HEMANGIOMAS - Benign normal skin lesions - Benign-appearing - Call for any changes  MELANOCYTIC NEVI - Tan-brown and/or  pink-flesh-colored symmetric macules and papules - Benign appearing on exam today - Observation - Call clinic for new or changing moles - Recommend daily use of broad spectrum spf 30+ sunscreen to sun-exposed areas.   HISTORY OF BASAL CELL CARCINOMA OF THE SKIN - No evidence of recurrence today - Recommend regular full body skin exams - Recommend daily broad spectrum sunscreen SPF 30+ to sun-exposed areas, reapply every 2 hours as needed.  - Call if any new or changing lesions are noted between office visits  Hyperhidrosis Location: B/L palms and soles, axillae, inframammary folds, inguinal creases Chronic and persistent condition with duration or expected duration over one year. Condition is bothersome/symptomatic for patient. Glycopyrrolate can significantly increase the risk of heat stroke so you should avoid using it in the heat, particularly while active. It can also cause dry mouth, blurred vision, difficulty with urination, headache, constipation, and racing heart. Take it only as directed. Never take more than 8 tablets total per day.   Continue Robinul 1 mg qd.   Long term medication management.  Patient is using long term (months to years) prescription medication  to control their dermatologic condition.  These medications require periodic monitoring to evaluate for efficacy and side effects and may require periodic laboratory monitoring.  Return in about 1 year (around 08/29/2024) for TBSE.  Maylene Roes, CMA, am acting as scribe for Armida Sans, MD .  Documentation: I have reviewed the above documentation for accuracy and completeness, and I agree with the above.  Armida Sans, MD

## 2023-09-03 ENCOUNTER — Ambulatory Visit: Admitting: Professional Counselor

## 2023-09-06 ENCOUNTER — Ambulatory Visit: Payer: 59 | Admitting: Dermatology

## 2023-09-19 ENCOUNTER — Ambulatory Visit: Payer: 59 | Admitting: Internal Medicine

## 2023-09-20 ENCOUNTER — Ambulatory Visit: Attending: Medical | Admitting: Medical

## 2023-09-20 ENCOUNTER — Encounter: Payer: Self-pay | Admitting: Medical

## 2023-09-20 VITALS — BP 130/70 | HR 97 | Ht 61.0 in | Wt 157.4 lb

## 2023-09-20 DIAGNOSIS — R079 Chest pain, unspecified: Secondary | ICD-10-CM | POA: Diagnosis not present

## 2023-09-20 DIAGNOSIS — E782 Mixed hyperlipidemia: Secondary | ICD-10-CM | POA: Diagnosis not present

## 2023-09-20 DIAGNOSIS — R000 Tachycardia, unspecified: Secondary | ICD-10-CM

## 2023-09-20 NOTE — Patient Instructions (Signed)
 Medication Instructions:  Your Physician recommend you continue on your current medication as directed.    *If you need a refill on your cardiac medications before your next appointment, please call your pharmacy*  Lab Work: No labs ordered today  If you have labs (blood work) drawn today and your tests are completely normal, you will receive your results only by: MyChart Message (if you have MyChart) OR A paper copy in the mail If you have any lab test that is abnormal or we need to change your treatment, we will call you to review the results.  Testing/Procedures: No test ordered today   Follow-Up: At [facility], you and your health needs are our priority.  As part of our continuing mission to provide you with exceptional heart care, our providers are all part of one team.  This team includes your primary Cardiologist (physician) and Advanced Practice Providers or APPs (Physician Assistants and Nurse Practitioners) who all work together to provide you with the care you need, when you need it.  Your next appointment:   6 month(s)  Provider:   Toribio Frees, PA-C

## 2023-09-20 NOTE — Progress Notes (Signed)
 Cardiology Office Note:  .   Date:  09/20/2023  ID:  Wanda, [name] [birth year], MRN [id number] PCP: Marcine Severin, MD  [facility] Providers Cardiologist:  Sammy Crisp, MD {  History of Present Illness: .   Wanda [name] is a 68 y.o. female with a hx of orthostatic hypotension, sinus tachycardia, HLD, anxiety, depression who presents for 6 month follow-up.    Zio monitor in 2022, ordered for exertional dyspnea and palpitations, showed NSR with rare ectopy, 4 atrial runs lasting up to 23.3 seconds, pSVT. Triggered events correlated with NSR with PVCs. Echo 10/28/20 showed EF 60-65% G1DD. Patient takes metoprolol  for palpitations.   The patient was last seen 02/2023 reporting chest pain suspected from stressors at home.   Today, the patient reports she is doing well. She had 4 deaths in the family in the last 3 years and was having trouble coping, she is doing therapy and taking medications. She feels she is doing better. She denies chest pain. She can get SOB when she gets upset. She denies lightheadedness, dizziness, lower leg edema, palpitations.   Studies Reviewed: Aaron Aas   EKG Interpretation Date/Time:  Thursday September 20 2023 08:27:15 EDT Ventricular Rate:  97 PR Interval:  144 QRS Duration:  66 QT Interval:  344 QTC Calculation: 436 R Axis:   41  Text Interpretation: Normal sinus rhythm Septal infarct , age undetermined When compared with ECG of 20-Mar-2023 09:01, Septal infarct is now Present Confirmed by Gennaro Khat, Oliva Montecalvo ([id number]) on 09/20/2023 8:34:26 AM    Echo 10/28/20  1. Left ventricular ejection fraction, by estimation, is 60 to 65%. The  left ventricle has normal function. The left ventricle has no regional  wall motion abnormalities. Left ventricular diastolic parameters are  consistent with Grade I diastolic  dysfunction (impaired relaxation).   2. Right ventricular systolic function is normal. The right ventricular  size is normal. Tricuspid regurgitation  signal is inadequate for assessing  PA pressure.   08/18/20 Cardiac monitor The patient was monitored for 14 days. The predominant rhythm was sinus with an average rate of 93 bpm (range 55-180 bpm). There were rare PAC's and occasional PVC's (1.1% PVC burden). Four atrial runs lasting up to 23.3 seconds occurred with a maximum rate of 174 bpm. No sustained arrhythmia or prolonged pause was identified. Patient triggered event corresponds to sinus rhythm with isolated PVC. Predominantly sinus rhythm with rare PAC's and occasional PVC's.  PSVT also noted (see details above).   Echo 05/2016 - Procedure narrative: Transthoracic echocardiography. The study    was technically difficult.  - Left ventricle: Systolic function was normal. The estimated    ejection fraction was in the range of 60% to 65%.  - Aortic valve: Valve area (Vmax): 2.13 cm^2.   Long term monitor 05/2016 The patient was monitored for 13 days, 20 hours. The predominant rhythm was sinus with an average rate of 84 bpm (range 52-170 bpm). Rare PACs and atrial couplets were identified. Rare isolated PVCs were also seen. There were 5 episodes of supraventricular tachycardia lasting up to 10.8 seconds with a maximal rate of 152 bpm. There were no sustained arrhythmias or prolonged pauses. There were no patient triggered episodes. Predominantly sinus rhythm with rare PACs and PVCs, as well as paroxysmal SVT.   Carotids 2018 IMPRESSION: Mild bilateral carotid atherosclerotic vascular disease. No flow limiting stenosis. Degree of stenosis less than 50% bilaterally. 2. Vertebral arteries are patent with antegrade flow.      Physical Exam:  VS:  BP 130/70   Pulse 97   Ht 5\' 1"  (1.549 m)   Wt 157 lb 6.4 oz (71.4 kg)   LMP 09/30/1991 (Approximate) Comment: 1993  SpO2 93%   BMI 29.74 kg/m    Wt Readings from Last 3 Encounters:  09/20/23 157 lb 6.4 oz (71.4 kg)  03/20/23 156 lb (70.8 kg)  12/26/21 153 lb (69.4 kg)    GEN:  Well nourished, well developed in no acute distress NECK: No JVD; No carotid bruits CARDIAC: RRR, no murmurs, rubs, gallops RESPIRATORY:  Clear to auscultation without rales, wheezing or rhonchi  ABDOMEN: Soft, non-tender, non-distended EXTREMITIES:  No edema; No deformity   ASSESSMENT AND PLAN: .    H/o chest pain  It was felt chest pain was due to significant family stressors. Patient seems to be improving with therapy and medication. Patient denies further chest pain. She feels some SOB when she gets up set. No further work-up at this time.  Inappropriate sinus tachycardia Patient denies palpitations. Continue Toprol  50mg  daily.   HLD LDL 71. Continue Lipitor 80mg  daily.        Dispo: Follow-up in 6 months  Signed, Shealynn Saulnier Rebekah Canada, PA-C

## 2023-09-27 ENCOUNTER — Ambulatory Visit (INDEPENDENT_AMBULATORY_CARE_PROVIDER_SITE_OTHER): Admitting: Professional Counselor

## 2023-09-27 DIAGNOSIS — F4381 Prolonged grief disorder: Secondary | ICD-10-CM

## 2023-09-27 NOTE — Progress Notes (Signed)
  THERAPIST PROGRESS NOTE  Session Time: 3:05 PM - 3:55 PM   Participation Level: Active  Behavioral Response: Well Groomed, Alert, Dysphoric  Type of Therapy: Individual Therapy  Treatment Goals addressed: Active Anxiety  LTG: "I know I distract myself when I over think. I do a lot of over-thinking, but I'm always trying to rationalize."                Start:  07/17/23    Expected End:  07/15/24      STG: "If I don't take it, I can't sleep." To improve sleep hygiene AEB creating a routine and using grounding mechanisms to maintain healthy sleeping patterns over the next 12 weeks.     STG: To reduce the impact of grief/loss AEB processing events, sharing balanced thinking and emotional responses, and coming to a place of acceptance over the next 12 weeks.    ProgressTowards Goals: Progressing  Interventions: Supportive  Summary: Wanda [name] is a 68 y.o. female who presents with a history of anxiety, depression, and complex grief disorder. She appeared alert and oriented x5. She discussed how things have been going lately. She noted continued support from church and online support group. [name] discussed her daughter and grandson's death. She discussed the relationship with her granddaughter. She continues to try to foster that relationship. [name] agreed she may ask if her granddaughter can come visit her this summer. She agreed to schedule an additional follow-up.   Suicidal/Homicidal: No  Therapist Response: Conducted session with . Began session with check-in/update since previous session. Utilized empathetic and reflective listening. Scheduled additional appointment and concluded session.   Plan: Return again in 7 weeks.  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 Patient/Guardian was advised if they have not already done so to contact the registration department to sign all necessary forms in order for us  to release information regarding their care.   Consent: Patient/Guardian gives verbal consent for treatment and assignment of benefits for services provided during this visit. Patient/Guardian expressed understanding and agreed to proceed.   Len Quale, [facility] 09/27/2023

## 2023-10-01 ENCOUNTER — Other Ambulatory Visit: Payer: Self-pay | Admitting: Internal Medicine

## 2023-11-14 ENCOUNTER — Ambulatory Visit: Admitting: Professional Counselor

## 2023-12-03 ENCOUNTER — Other Ambulatory Visit: Payer: Self-pay | Admitting: Dermatology

## 2023-12-03 DIAGNOSIS — R61 Generalized hyperhidrosis: Secondary | ICD-10-CM

## 2023-12-06 ENCOUNTER — Ambulatory Visit: Payer: Self-pay | Admitting: Psychiatry

## 2023-12-06 ENCOUNTER — Encounter: Payer: Self-pay | Admitting: Psychiatry

## 2023-12-06 ENCOUNTER — Telehealth: Payer: Self-pay | Admitting: Sleep Medicine

## 2023-12-06 VITALS — BP 138/86 | HR 97 | Temp 98.8°F | Ht 61.0 in | Wt 155.4 lb

## 2023-12-06 DIAGNOSIS — F330 Major depressive disorder, recurrent, mild: Secondary | ICD-10-CM | POA: Diagnosis not present

## 2023-12-06 DIAGNOSIS — F411 Generalized anxiety disorder: Secondary | ICD-10-CM

## 2023-12-06 DIAGNOSIS — Z634 Disappearance and death of family member: Secondary | ICD-10-CM

## 2023-12-06 DIAGNOSIS — R0683 Snoring: Secondary | ICD-10-CM

## 2023-12-06 DIAGNOSIS — F4010 Social phobia, unspecified: Secondary | ICD-10-CM

## 2023-12-06 DIAGNOSIS — F5101 Primary insomnia: Secondary | ICD-10-CM

## 2023-12-06 MED ORDER — LAMOTRIGINE 25 MG PO TABS: 25.0000 mg | ORAL_TABLET | Freq: Every day | ORAL | 1 refills | Status: DC

## 2023-12-06 MED ORDER — VILAZODONE HCL 40 MG PO TABS: 40.0000 mg | ORAL_TABLET | Freq: Every day | ORAL | 1 refills | Status: AC

## 2023-12-06 MED ORDER — HYDROXYZINE PAMOATE 25 MG PO CAPS: 25.0000 mg | ORAL_CAPSULE | Freq: Every evening | ORAL | 5 refills | Status: DC | PRN
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
## 2023-12-06 NOTE — Patient Instructions (Signed)
 Lamotrigine Tablets What is this medication? LAMOTRIGINE (la MOE Patrecia Pace) prevents and controls seizures in people with epilepsy. It may also be used to treat bipolar disorder. It works by calming overactive nerves in your body. This medicine may be used for other purposes; ask your health care provider or pharmacist if you have questions. COMMON BRAND NAME(S): Lamictal, Subvenite What should I tell my care team before I take this medication? They need to know if you have any of these conditions: Heart disease History of irregular heartbeat Immune system problems Kidney disease Liver disease Low levels of folic acid in the blood Lupus Mental health condition Suicidal thoughts, plans, or attempt by you or a family member An unusual or allergic reaction to lamotrigine, other medications, foods, dyes, or preservatives Pregnant or trying to get pregnant Breastfeeding How should I use this medication? Take this medication by mouth with a glass of water. Follow the directions on the prescription label. Do not chew these tablets. If this medication upsets your stomach, take it with food or milk. Take your doses at regular intervals. Do not take your medication more often than directed. A special MedGuide will be given to you by the pharmacist with each new prescription and refill. Be sure to read this information carefully each time. Talk to your care team about the use of this medication in children. While this medication may be prescribed for children as young as 2 years for selected conditions, precautions do apply. Overdosage: If you think you have taken too much of this medicine contact a poison control center or emergency room at once. NOTE: This medicine is only for you. Do not share this medicine with others. What if I miss a dose? If you miss a dose, take it as soon as you can. If it is almost time for your next dose, take only that dose. Do not take double or extra doses. What may  interact with this medication? Atazanavir Certain medications for irregular heartbeat Certain medications for seizures, such as carbamazepine, phenobarbital, phenytoin, primidone, or valproic acid Estrogen or progestin hormones Lopinavir Rifampin Ritonavir This list may not describe all possible interactions. Give your health care provider a list of all the medicines, herbs, non-prescription drugs, or dietary supplements you use. Also tell them if you smoke, drink alcohol, or use illegal drugs. Some items may interact with your medicine. What should I watch for while using this medication? Visit your care team for regular checks on your progress. If you take this medication for seizures, wear a Medic Alert bracelet or necklace. Carry an identification card with information about your condition, medications, and care team. It is important to take this medication exactly as directed. When first starting treatment, your dose will need to be adjusted slowly. It may take weeks or months before your dose is stable. You should contact your care team if your seizures get worse or if you have any new types of seizures. Do not stop taking this medication unless instructed by your care team. Stopping your medication suddenly can increase your seizures or their severity. This medication may cause serious skin reactions. They can happen weeks to months after starting the medication. Contact your care team right away if you notice fevers or flu-like symptoms with a rash. The rash may be red or purple and then turn into blisters or peeling of the skin. You may also notice a red rash with swelling of the face, lips, or lymph nodes in your neck or under your  arms. This medication may affect your coordination, reaction time, or judgment. Do not drive or operate machinery until you know how this medication affects you. Sit up or stand slowly to reduce the risk of dizzy or fainting spells. Drinking alcohol with this  medication can increase the risk of these side effects. If you are taking this medication for bipolar disorder, it is important to report any changes in your mood to your care team. If your condition gets worse, you get mentally depressed, feel very hyperactive or manic, have difficulty sleeping, or have thoughts of hurting yourself or committing suicide, you need to get help from your care team right away. If you are a caregiver for someone taking this medication for bipolar disorder, you should also report these behavioral changes right away. The use of this medication may increase the chance of suicidal thoughts or actions. Pay special attention to how you are responding while on this medication. Your mouth may get dry. Chewing sugarless gum or sucking hard candy and drinking plenty of water may help. Contact your care team if the problem does not go away or is severe. If you become pregnant while using this medication, you may enroll in the [place] American Antiepileptic Drug Pregnancy Registry by calling [phone]. This registry collects information about the safety of antiepileptic medication use during pregnancy. This medication may cause a decrease in folic acid. You should make sure that you get enough folic acid while you are taking this medication. Discuss the foods you eat and the vitamins you take with your care team. What side effects may I notice from receiving this medication? Side effects that you should report to your care team as soon as possible: Allergic reactions--skin rash, itching, hives, swelling of the face, lips, tongue, or throat Change in vision Fever, neck pain or stiffness, sensitivity to light, headache, nausea, vomiting, confusion, which may be signs of meningitis Fever, rash, swollen lymph nodes, confusion, trouble walking, loss of balance or coordination, seizures Heart rhythm changes--fast or irregular heartbeat, dizziness, feeling faint or lightheaded, chest pain,  trouble breathing Infection--fever, chills, cough, or sore throat Low red blood cell level--unusual weakness or fatigue, dizziness, headache, trouble breathing Rash, fever, and swollen lymph nodes Redness, blistering, peeling, or loosening of the skin, including inside the mouth Thoughts of suicide or self-harm, worsening mood, feelings of depression Unusual bruising or bleeding Side effects that usually do not require medical attention (report these to your care team if they continue or are bothersome): Diarrhea Dizziness Drowsiness Headache Nausea Stomach pain Tremors or shaking This list may not describe all possible side effects. Call your doctor for medical advice about side effects. You may report side effects to FDA at 1-800-FDA-1088. Where should I keep my medication? Keep out of the reach of children and pets. Store at [employer] C (77 degrees F). Protect from light. Get rid of any unused medication after the expiration date. To get rid of medications that are no longer needed or have expired: Take the medication to a medication take-back program. Check with your pharmacy or law enforcement to find a location. If you cannot return the medication, check the label or package insert to see if the medication should be thrown out in the garbage or flushed down the toilet. If you are not sure, ask your care team. If it is safe to put it in the trash, empty the medication out of the container. Mix the medication with cat litter, dirt, coffee grounds, or other unwanted substance.  Seal the mixture in a bag or container. Put it in the trash. NOTE: This sheet is a summary. It may not cover all possible information. If you have questions about this medicine, talk to your doctor, pharmacist, or health care provider.  2024 Elsevier/Gold Standard (2023-04-27 00:00:00)

## 2023-12-06 NOTE — Telephone Encounter (Signed)
 LVMTCB to schedule sleep consult.

## 2023-12-10 ENCOUNTER — Other Ambulatory Visit: Payer: Self-pay

## 2023-12-10 DIAGNOSIS — R61 Generalized hyperhidrosis: Secondary | ICD-10-CM

## 2023-12-10 MED ORDER — GLYCOPYRROLATE 1 MG PO TABS: ORAL_TABLET | ORAL | 6 refills | Status: AC

## 2023-12-10 NOTE — Progress Notes (Signed)
 Patient left message that [facility] did not have any Rfs on file for Robinul . RX sent in . aw

## 2023-12-11 ENCOUNTER — Encounter: Payer: Self-pay | Admitting: Sleep Medicine

## 2023-12-11 ENCOUNTER — Ambulatory Visit (INDEPENDENT_AMBULATORY_CARE_PROVIDER_SITE_OTHER): Admitting: Sleep Medicine

## 2023-12-11 VITALS — BP 100/60 | HR 105 | Temp 98.4°F | Ht 61.0 in | Wt 156.2 lb

## 2023-12-11 DIAGNOSIS — I10 Essential (primary) hypertension: Secondary | ICD-10-CM

## 2023-12-11 DIAGNOSIS — F411 Generalized anxiety disorder: Secondary | ICD-10-CM | POA: Diagnosis not present

## 2023-12-11 DIAGNOSIS — G4733 Obstructive sleep apnea (adult) (pediatric): Secondary | ICD-10-CM

## 2023-12-11 DIAGNOSIS — Z87891 Personal history of nicotine dependence: Secondary | ICD-10-CM

## 2023-12-11 NOTE — Patient Instructions (Signed)
 Wanda [name]

## 2023-12-11 NOTE — Progress Notes (Signed)
 Name:Danille PARTHENA FERGESON MRN: [id number] DOB: 1956/05/07   CHIEF COMPLAINT:  EXCESSIVE DAYTIME SLEEPINESS   HISTORY OF PRESENT ILLNESS:  Mrs. Wanda [name] is a 68 y.o. w/ a h/o HTN, hyperlipidemia, GERD, anxiety and depression who present for c/o loud snoring which has been present for several years. Reports nocturnal awakenings due to unclear reasons, however does not have difficulty falling back to sleep. Denies any significant weight changes. Admits to morning headaches, RLS symptoms, dream enactment, cataplexy, hypnagogic or hypnapompic hallucinations. Denies a family history of sleep apnea. Denies drowsy driving. Drinks 2 cups of coffee daily, occasional alcohol use, denies tobacco or illicit drug use.   Bedtime 9-10 pm Sleep onset 45 mins Rise time 6:30 am   EPWORTH SLEEP SCORE 0    12/11/2023    9:00 AM  Results of the Epworth flowsheet  Sitting and reading 0  Watching TV 0  Sitting, inactive in a public place (e.g. a theatre or a meeting) 0  As a passenger in a car for an hour without a break 0  Lying down to rest in the afternoon when circumstances permit 0  Sitting and talking to someone 0  Sitting quietly after a lunch without alcohol 0  In a car, while stopped for a few minutes in traffic 0  Total score 0     PAST MEDICAL HISTORY :   has a past medical history of Anxiety, Basal cell carcinoma (05/03/2016), Basal cell carcinoma (05/03/2016), Borderline diabetes, Broken hip, left, closed, with delayed healing, subsequent encounter (2018), BRONCHITIS, ACUTE WITH BRONCHOSPASM (06/25/2010), Chronic prescription benzodiazepine use (10/18/2015), Depression, Dysrhythmia, GASTROENTERITIS WITHOUT DEHYDRATION (05/14/2009), GERD (gastroesophageal reflux disease), Herpes, Hip fx, left, closed, with nonunion, subsequent encounter (05/29/2016), History of kidney stones, Hyperlipidemia, Hypertension, Irregular heart beat, Pre-diabetes, Sinus tachycardia (04/11/2017), Skin cancer of  face (08/01/2018), and Spinal cord stimulator status (battery on left buttocks) (10/18/2015).  has a past surgical history that includes Shoulder surgery (Right); Appendectomy; Abdominal hysterectomy; Back surgery; Spinal cord stimulator; Colonoscopy (N/A, 08/28/2013); Intramedullary (im) nail intertrochanteric (Left, 05/30/2016); Spinal cord stimulator removal (N/A, 01/12/2020); Breast excisional biopsy (Right); Endoscopic retrograde cholangiopancreatography (ercp) with propofol  (N/A, 09/20/2020); Cholecystectomy (09/2020); and Hip arthroscopy w/ labral repair (Left, 2018). Prior to Admission medications   Medication Sig Start Date End Date Taking? Authorizing Provider  alendronate (FOSAMAX) 70 MG tablet Take 70 mg by mouth once a week. 08/07/23  Yes [provider]  aspirin EC 81 MG tablet ASPIRIN EC 81 MG TBEC 11/30/16  Yes [provider]  atorvastatin  (LIPITOR) 80 MG tablet Take 1 tablet (80 mg total) by mouth daily. 03/20/23  Yes Furth, Cadence H, PA-C  Cholecalciferol (VITAMIN D3) 5000 units TABS Take 5,000 Units by mouth daily.   Yes [provider]  esomeprazole (NEXIUM) 40 MG capsule Take 40 mg by mouth daily. 02/05/23  Yes [provider]  famotidine  (PEPCID ) 40 MG tablet Take 40 mg by mouth at bedtime. 12/13/20  Yes [provider]  glycopyrrolate  (ROBINUL ) 1 MG tablet Take 2 tabs po QD for excessive sweating. 12/10/23  Yes Hester Alm BROCKS, MD  hydrOXYzine  (VISTARIL ) 25 MG capsule Take 1 capsule (25 mg total) by mouth at bedtime as needed for anxiety. 12/06/23  Yes Eappen, Saramma, MD  lamoTRIgine  (LAMICTAL ) 25 MG tablet Take 1 tablet (25 mg total) by mouth daily. 12/06/23  Yes Eappen, Saramma, MD  metoprolol  succinate (TOPROL  XL) 50 MG 24 hr tablet Take 1 tablet (50 mg total) by mouth daily. Take  with or immediately following a meal. 03/20/23  Yes Furth, Cadence H, PA-C  Multiple Vitamin (MULTIVITAMIN) tablet Take 1 tablet by mouth daily.   Yes  [provider]  naproxen  (NAPROSYN ) 250 MG tablet Take by mouth 2 (two) times daily with a meal.   Yes [provider]  Potassium Chloride  ER 20 MEQ TBCR POTASSIUM CHLORIDE  ER 20 MEQ CR-TABS 09/04/16  Yes [provider]  potassium chloride  SA (KLOR-CON  M) 20 MEQ tablet TAKE 1 TABLET(20 MEQ) BY MOUTH TWICE DAILY 10/01/23  Yes End, Lonni, MD  Vilazodone  HCl (VIIBRYD ) 40 MG TABS Take 1 tablet (40 mg total) by mouth daily. 12/06/23  Yes Eappen, Saramma, MD   Allergies  Allergen Reactions   Anesthetics, Halogenated     Darvocet   Levofloxacin Hives and Nausea And Vomiting   Tramadol Other (See Comments)    Severe headache   Pantoprazole  Itching   Codeine Nausea Only    Slight nausea   Mobic [Meloxicam] Other (See Comments)    Calf tightness   Propoxyphene Hcl Rash    FAMILY HISTORY:  family history includes Alcohol abuse in her daughter; Anxiety disorder in her daughter; Breast cancer in her paternal aunt; COPD in her father and mother; Cancer in her cousin and paternal aunt; Depression in her daughter; Diabetes in her cousin, father, mother, paternal grandmother, and sister; Drug abuse in her daughter and son; Heart attack (age of onset: 46) in her father; Heart disease in her father and mother; Pancreatic cancer in her sister; Tuberculosis in her maternal aunt. SOCIAL HISTORY:  reports that she quit smoking about 33 years ago. Her smoking use included cigarettes. She started smoking about 47 years ago. She has a 3.5 pack-year smoking history. She has never used smokeless tobacco. She reports current alcohol use. She reports that she does not use drugs.   Review of Systems:  Gen:  Denies  fever, sweats, chills weight loss  HEENT: Denies blurred vision, double vision, ear pain, eye pain, hearing loss, nose bleeds, sore throat Cardiac:  No dizziness, chest pain or heaviness, chest tightness,edema, No JVD Resp:   No cough, -sputum production, -shortness of  breath,-wheezing, -hemoptysis,  Gi: Denies swallowing difficulty, stomach pain, nausea or vomiting, diarrhea, constipation, bowel incontinence Gu:  Denies bladder incontinence, burning urine Ext:   Denies Joint pain, stiffness or swelling Skin: Denies  skin rash, easy bruising or bleeding or hives Endoc:  Denies polyuria, polydipsia , polyphagia or weight change Psych:   Denies depression, insomnia or hallucinations  Other:  All other systems negative  VITAL SIGNS: BP 100/60 (BP Location: Right Arm, Patient Position: Sitting, Cuff Size: Large)   Pulse (!) 105   Temp 98.4 F (36.9 C) (Other (Comment))   Ht 5' 1 (1.549 m)   Wt 156 lb 3.2 oz (70.9 kg)   LMP 09/30/1991 (Approximate) Comment: 1993  SpO2 95%   BMI 29.51 kg/m    Physical Examination:   General Appearance: No distress  EYES PERRLA, EOM intact.   NECK Supple, No JVD Pulmonary: normal breath sounds, No wheezing.  CardiovascularNormal S1,S2.  No m/r/g.   Abdomen: Benign, Soft, non-tender. Skin:   warm, no rashes, no ecchymosis  Extremities: normal, no cyanosis, clubbing. Neuro:without focal findings,  speech normal  PSYCHIATRIC: Mood, affect within normal limits.   ASSESSMENT AND PLAN  OSA   I suspect that OSA is likely present due to clinical presentation. Discussed the consequences of untreated sleep apnea. Advised not to drive drowsy for safety  of patient and others. Will complete further evaluation with a home sleep study and follow up to review results.    HTN Stable, on current management. Following with PCP.   Anxiety Stable, on current management.    MEDICATION ADJUSTMENTS/LABS AND TESTS ORDERED: Recommend Sleep Study   Patient  satisfied with Plan of action and management. All questions answered  Follow up to review HST results and treatment plan.   I spent a total of 47 minutes reviewing chart data, face-to-face evaluation with the patient, counseling and coordination of care as detailed  above.    Colson Barco, M.D.  Sleep Medicine [facility]

## 2023-12-12 ENCOUNTER — Ambulatory Visit (INDEPENDENT_AMBULATORY_CARE_PROVIDER_SITE_OTHER): Admitting: Professional Counselor

## 2023-12-12 DIAGNOSIS — F411 Generalized anxiety disorder: Secondary | ICD-10-CM

## 2023-12-12 DIAGNOSIS — F330 Major depressive disorder, recurrent, mild: Secondary | ICD-10-CM | POA: Diagnosis not present

## 2023-12-12 DIAGNOSIS — Z634 Disappearance and death of family member: Secondary | ICD-10-CM | POA: Diagnosis not present
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
## 2023-12-14 ENCOUNTER — Encounter: Payer: Self-pay | Admitting: Advanced Practice Midwife

## 2024-01-29 ENCOUNTER — Other Ambulatory Visit: Payer: Self-pay | Admitting: Pediatrics

## 2024-01-29 DIAGNOSIS — Z1231 Encounter for screening mammogram for malignant neoplasm of breast: Secondary | ICD-10-CM

## 2024-02-07 ENCOUNTER — Encounter: Payer: Self-pay | Admitting: Psychiatry

## 2024-02-07 ENCOUNTER — Ambulatory Visit (INDEPENDENT_AMBULATORY_CARE_PROVIDER_SITE_OTHER): Admitting: Psychiatry

## 2024-02-07 VITALS — BP 118/78 | HR 109 | Temp 98.4°F | Ht 61.0 in | Wt 152.8 lb

## 2024-02-07 DIAGNOSIS — F4010 Social phobia, unspecified: Secondary | ICD-10-CM

## 2024-02-07 DIAGNOSIS — F3342 Major depressive disorder, recurrent, in full remission: Secondary | ICD-10-CM

## 2024-02-07 DIAGNOSIS — F411 Generalized anxiety disorder: Secondary | ICD-10-CM | POA: Diagnosis not present

## 2024-02-07 DIAGNOSIS — F5101 Primary insomnia: Secondary | ICD-10-CM

## 2024-02-07 NOTE — Progress Notes (Signed)
 [facility] MD OP Progress Note  02/07/2024 11:02 AM Wanda [name]  MRN:  [id number]  Chief Complaint:  Chief Complaint  Patient presents with   Follow-up   Depression   Anxiety   Medication Refill   Discussed the use of AI scribe software for clinical note transcription with the patient, who gave verbal consent to proceed.  History of Present Illness Wanda [name] is a 68 year old Caucasian female, widowed on SSI, lives in [place], has a history of GAD, MDD, social anxiety disorder, SVT, orthostatic hypotension, hyperlipidemia was evaluated in office today for a follow-up appointment.  She notes that Lamictal  did not provide any improvement, so she discontinued it after 1 month without a refill. She continues to take Vibryd and hydroxyzine  25 mg at bedtime as needed, and confirms adherence to her other prescribed medications.  She reports sleep difficulties, which she relates in part to back pain and discomfort with certain sleeping positions. She reports improvement in sleep since starting an over-the-counter supplement, Natrol Sleep and Restore, and currently sleeps about 6 to 8 hours per night. She declined a previously recommended sleep study due to claustrophobia and discomfort with the process.  She reports significant back pain, a history of 2 prior back surgeries, and removal of a spinal stimulator. She currently manages her pain with Advil.   She denies any suicidality, homicidality or perceptual disturbances.   Visit Diagnosis:    ICD-10-CM   1. GAD (generalized anxiety disorder)  F41.1     2. Recurrent major depressive disorder, in full remission (HCC)  F33.42     3. Social anxiety disorder  F40.10     4. Primary insomnia  F51.01       Past Psychiatric History: I have reviewed past psychiatric history from progress note on 08/21/2017.  I have reviewed my progress note on 06/07/2023.  Past Medical History:  Past Medical History:  Diagnosis Date   Anxiety    Basal cell  carcinoma 05/03/2016   Left deltoid. Superficial.   Basal cell carcinoma 05/03/2016   Left distal lat. tricep. Nodular.   Borderline diabetes    Broken hip, left, closed, with delayed healing, subsequent encounter 2018   BRONCHITIS, ACUTE WITH BRONCHOSPASM 06/25/2010   Qualifier: Diagnosis of  By: Desiderio MD, Eugene     Chronic prescription benzodiazepine use 10/18/2015   Depression    Dysrhythmia    tachycardia   GASTROENTERITIS WITHOUT DEHYDRATION 05/14/2009   Qualifier: Diagnosis of  By: Jenetta MD, Talia     GERD (gastroesophageal reflux disease)    Herpes    Hip fx, left, closed, with nonunion, subsequent encounter 05/29/2016   History of kidney stones    Hyperlipidemia    Hypertension    Irregular heart beat    Pre-diabetes    Sinus tachycardia 04/11/2017   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 Tanya Serial number: [id number] Model number: [id number]     Past Surgical History:  Procedure Laterality Date   ABDOMINAL HYSTERECTOMY     APPENDECTOMY     BACK SURGERY     X 3   BREAST EXCISIONAL BIOPSY Right    surgical bx age 31    CHOLECYSTECTOMY  09/2020   COLONOSCOPY N/A 08/28/2013   Procedure: COLONOSCOPY;  Surgeon: Margo LITTIE Haddock, MD;  Location: [facility];  Service: Endoscopy;  Laterality: N/A;  8:30 AM   ENDOSCOPIC RETROGRADE CHOLANGIOPANCREATOGRAPHY (  ERCP) WITH PROPOFOL  N/A 09/20/2020   Procedure: ENDOSCOPIC RETROGRADE CHOLANGIOPANCREATOGRAPHY (ERCP) WITH PROPOFOL ;  Surgeon: Jinny Carmine, MD;  Location: [facility];  Service: Endoscopy;  Laterality: N/A;   HIP ARTHROSCOPY W/ LABRAL REPAIR Left 2018   hip was not replaced but rod was inserted to correct injury.   INTRAMEDULLARY (IM) NAIL INTERTROCHANTERIC Left 05/30/2016   Procedure: INTRAMEDULLARY (IM) NAIL INTERTROCHANTRIC;  Surgeon: Franky Cranker, MD;  Location: [facility];  Service: Orthopedics;   Laterality: Left;   SHOULDER SURGERY Right    Spinal cord stimulator     SPINAL CORD STIMULATOR REMOVAL N/A 01/12/2020   Procedure: REMOVAL SPINAL CORD STIMULATOR AND PULSE GENERATOR;  Surgeon: Bluford Standing, MD;  Location: [facility] OR[facility];  Service: Neurosurgery;  Laterality: N/A;  Local w/ MAC    Family Psychiatric History: I have reviewed family psychiatric history from progress note on 08/21/2017.  Family History:  Family History  Problem Relation Age of Onset   Diabetes Mother    COPD Mother    Heart disease Mother    Diabetes Father    COPD Father    Heart disease Father    Heart attack Father 44       CABG   Diabetes Sister    Pancreatic cancer Sister    Tuberculosis Maternal Aunt    Cancer Paternal Aunt        breast   Breast cancer Paternal Aunt    Diabetes Paternal Grandmother    Cancer Cousin    Diabetes Cousin    Alcohol abuse Daughter    Depression Daughter    Drug abuse Daughter    Anxiety disorder Daughter    Drug abuse Son    Colon cancer Neg Hx    Sudden Cardiac Death Neg Hx     Social History: I have reviewed social history from progress note on 08/21/2017. Social History   Socioeconomic History   Marital status: Divorced    Spouse name: Not on file   Number of children: 2   Years of education: Not on file   Highest education level: High school graduate  Occupational History    Comment: disabled  Tobacco Use   Smoking status: Former    Current packs/day: 0.00    Average packs/day: 0.3 packs/day for 14.0 years (3.5 ttl pk-yrs)    Types: Cigarettes    Start date: 64    Quit date: 59    Years since quitting: 33.7   Smokeless tobacco: Never  Vaping Use   Vaping status: Never Used  Substance and Sexual Activity   Alcohol use: Yes    Comment: occasional; once every 6 months   Drug use: No   Sexual activity: Yes    Birth control/protection: Surgical  Other Topics Concern   Not on file  Social History Narrative   Not on file   Social Drivers  of Health   Financial Resource Strain: Low Risk  (07/03/2023)   Overall Financial Resource Strain (CARDIA)    Difficulty of Paying Living Expenses: Not hard at all  Food Insecurity: No Food Insecurity (07/03/2023)   Hunger Vital Sign    Worried About Running Out of Food in the Last Year: Never true    Ran Out of Food in the Last Year: Never true  Transportation Needs: No Transportation Needs (07/03/2023)   PRAPARE - Administrator, Civil Service (Medical): No    Lack of Transportation (Non-Medical): No  Physical Activity: Insufficiently Active (07/03/2023)   Exercise Vital Sign  Days of Exercise per Week: 7 days    Minutes of Exercise per Session: 10 min  Stress: Stress Concern Present (07/03/2023)   [employer] of Occupational Health - Occupational Stress Questionnaire    Feeling of Stress : To some extent  Social Connections: Socially Isolated (07/03/2023)   Social Connection and Isolation Panel    Frequency of Communication with Friends and Family: Once a week    Frequency of Social Gatherings with Friends and Family: Never    Attends Religious Services: Never    Database administrator or Organizations: No    Attends Banker Meetings: Never    Marital Status: Divorced    Allergies:  Allergies  Allergen Reactions   Anesthetics, Halogenated     Darvocet   Levofloxacin Hives and Nausea And Vomiting   Tramadol Other (See Comments)    Severe headache   Pantoprazole  Itching   Codeine Nausea Only    Slight nausea   Mobic [Meloxicam] Other (See Comments)    Calf tightness   Propoxyphene Hcl Rash    Metabolic Disorder Labs: Lab Results  Component Value Date   HGBA1C 5.4 05/29/2016   MPG 108 05/29/2016   No results found for: PROLACTIN Lab Results  Component Value Date   CHOL 164 12/02/2020   TRIG 262 (H) 12/02/2020   HDL 48 12/02/2020   CHOLHDL 3.4 12/02/2020   VLDL 59 (H) 09/29/2016   LDLCALC 74 12/02/2020   LDLCALC 78 07/08/2018   Lab  Results  Component Value Date   TSH 3.700 09/19/2020   TSH 0.932 07/14/2020    Therapeutic Level Labs: No results found for: LITHIUM No results found for: VALPROATE No results found for: CBMZ  Current Medications: Current Outpatient Medications  Medication Sig Dispense Refill   alendronate (FOSAMAX) 70 MG tablet Take 70 mg by mouth once a week.     aspirin EC 81 MG tablet ASPIRIN EC 81 MG TBEC     atorvastatin  (LIPITOR) 80 MG tablet Take 1 tablet (80 mg total) by mouth daily. 90 tablet 3   Cholecalciferol (VITAMIN D3) 5000 units TABS Take 5,000 Units by mouth daily.     colestipol (COLESTID) 1 g tablet Take 2 g by mouth.     esomeprazole (NEXIUM) 40 MG capsule Take 40 mg by mouth daily.     famotidine  (PEPCID ) 40 MG tablet Take 40 mg by mouth at bedtime.     glycopyrrolate  (ROBINUL ) 1 MG tablet Take 2 tabs po QD for excessive sweating. 60 tablet 6   hydrOXYzine  (VISTARIL ) 25 MG capsule Take 1 capsule (25 mg total) by mouth at bedtime as needed for anxiety. 30 capsule 5   metoprolol  succinate (TOPROL  XL) 50 MG 24 hr tablet Take 1 tablet (50 mg total) by mouth daily. Take with or immediately following a meal. 90 tablet 3   Multiple Vitamin (MULTIVITAMIN) tablet Take 1 tablet by mouth daily.     Na Sulfate-K Sulfate-Mg Sulfate concentrate (SUPREP) 17.5-3.13-1.6 GM/177ML SOLN Take 1 Bottle by mouth.     naproxen  (NAPROSYN ) 250 MG tablet Take by mouth 2 (two) times daily with a meal.     neomycin -polymyxin b -dexamethasone  (MAXITROL) 3.5-10000-0.1 SUSP Apply 1 drop to eye.     Potassium Chloride  ER 20 MEQ TBCR POTASSIUM CHLORIDE  ER 20 MEQ CR-TABS     potassium chloride  SA (KLOR-CON  M) 20 MEQ tablet TAKE 1 TABLET(20 MEQ) BY MOUTH TWICE DAILY 180 tablet 0   Vilazodone  HCl (VIIBRYD ) 40 MG TABS Take 1  tablet (40 mg total) by mouth daily. 90 tablet 1   No current facility-administered medications for this visit.     Musculoskeletal: Strength & Muscle Tone: within normal limits Gait &  Station: normal Patient leans: N/A  Psychiatric Specialty Exam: Review of Systems  Psychiatric/Behavioral:  Positive for sleep disturbance. The patient is nervous/anxious.     Blood pressure 118/78, pulse (!) 109, temperature 98.4 F (36.9 C), temperature source Temporal, height 5' 1 (1.549 m), weight 152 lb 12.8 oz (69.3 kg), last menstrual period 09/30/1991, SpO2 99%.Body mass index is 28.87 kg/m.  General Appearance: Casual  Eye Contact:  Fair  Speech:  Normal Rate  Volume:  Normal  Mood:  Anxious due to pain  Affect:  Congruent  Thought Process:  Goal Directed and Descriptions of Associations: Intact  Orientation:  Full (Time, Place, and Person)  Thought Content: Logical   Suicidal Thoughts:  No  Homicidal Thoughts:  No  Memory:  Immediate;   Fair Recent;   Fair Remote;   Fair  Judgement:  Fair  Insight:  Fair  Psychomotor Activity:  Normal  Concentration:  Concentration: Fair and Attention Span: Fair  Recall:  [employer] of Knowledge: Fair  Language: Fair  Akathisia:  No  Handed:  Right  AIMS (if indicated): not done  Assets:  Manufacturing systems engineer Desire for Improvement Housing Social Support Transportation  ADL's:  Intact  Cognition: WNL  Sleep:  varies   Screenings: Geneticist, molecular Office Visit from [facility]/27/2022 in [facility]  AIMS Total Score 0   GAD-7    Flowsheet Row Office Visit from 12/06/2023 in [facility] Counselor from 07/31/2023 in [facility] Counselor from 07/03/2023 in [facility] Office Visit from 06/07/2023 in [facility] Office Visit from 12/07/2022 in [facility]  Total GAD-7 Score 1 2 2 3 1    PHQ2-9    Flowsheet Row Office Visit from 02/07/2024 in [facility]  Office Visit from 12/06/2023 in [facility] Counselor from 07/31/2023 in [facility] Counselor from 07/03/2023 in [facility] Office Visit from 06/07/2023 in [facility]  PHQ-2 Total Score 1 2 0 0 0  PHQ-9 Total Score -- 6 5 4  --   Flowsheet Row Office Visit from 02/07/2024 in [facility] Office Visit from 12/06/2023 in [facility] Counselor from 07/31/2023 in [facility]  C-SSRS RISK CATEGORY No Risk No Risk No Risk     Assessment and Plan: Wanda [name] is a 68 year old Caucasian female who has a history of GAD, MDD, insomnia was evaluated in office today for a follow-up appointment.  Discussed assessment and plan as noted below.  Generalized anxiety disorder-improving MDD in remission Social anxiety disorder-improving Currently anxiety symptoms mostly because of ongoing pain.  Currently waiting to follow-up with her provider who manages her pain.  Reports otherwise she is doing fairly well on the current medication regimen.  Noncompliant with Lamictal  since she felt the low dosage was not helpful and stopped it after a month.  No longer following up with her therapist , reports she was released since she was doing well. Continue Viibryd  40 mg daily Continue Hydroxyzine  25 mg at bedtime as  needed   Insomnia-improving She reports sleep is better although pain does affect her sleep.  Currently taking over the counter sleep medication Natrol which helps. Was referred for sleep study however she declined any further testing after initial consultation. Continue sleep hygiene techniques. Will need sufficient pain management.  Follow-up Follow-up in clinic in 3 to 4 months or sooner if needed.    Collaboration of Care:  Collaboration of Care: Other encouraged to follow-up with pain provider for management of pain.  Patient/Guardian was advised Release of Information must be obtained prior to any record release in order to collaborate their care with an outside provider. Patient/Guardian was advised if they have not already done so to contact the registration department to sign all necessary forms in order for us  to release information regarding their care.   Consent: Patient/Guardian gives verbal consent for treatment and assignment of benefits for services provided during this visit. Patient/Guardian expressed understanding and agreed to proceed.  This note was generated in part or whole with voice recognition software. Voice recognition is usually quite accurate but there are transcription errors that can and very often do occur. I apologize for any typographical errors that were not detected and corrected.     Eevee Borbon, MD 02/07/2024, 11:02 AM

## 2024-02-08 ENCOUNTER — Other Ambulatory Visit: Payer: Self-pay | Admitting: Internal Medicine

## 2024-02-27 ENCOUNTER — Ambulatory Visit

## 2024-03-03 ENCOUNTER — Encounter

## 2024-03-18 ENCOUNTER — Encounter: Payer: Self-pay | Admitting: Medical

## 2024-03-18 ENCOUNTER — Ambulatory Visit: Attending: Medical | Admitting: Medical

## 2024-03-18 VITALS — BP 115/78 | HR 98 | Ht 61.0 in | Wt 151.2 lb

## 2024-03-18 DIAGNOSIS — R000 Tachycardia, unspecified: Secondary | ICD-10-CM

## 2024-03-18 DIAGNOSIS — E119 Type 2 diabetes mellitus without complications: Secondary | ICD-10-CM

## 2024-03-18 DIAGNOSIS — E782 Mixed hyperlipidemia: Secondary | ICD-10-CM

## 2024-03-18 NOTE — Patient Instructions (Signed)
 Medication Instructions:  Your physician recommends that you continue on your current medications as directed. Please refer to the Current Medication list given to you today.    *If you need a refill on your cardiac medications before your next appointment, please call your pharmacy*  Lab Work: No labs ordered today    Testing/Procedures: No test ordered today   Follow-Up: At [facility], you and your health needs are our priority.  As part of our continuing mission to provide you with exceptional heart care, our providers are all part of one team.  This team includes your primary Cardiologist (physician) and Advanced Practice Providers or APPs (Physician Assistants and Nurse Practitioners) who all work together to provide you with the care you need, when you need it.  Your next appointment:   6 month(s)  Provider:   Lonni Hanson, MD or Cadence Franchester, PA-C

## 2024-03-18 NOTE — Progress Notes (Signed)
 Cardiology Office Note   Date:  03/18/2024  ID:  Wanda, [name] April 17, 1956, MRN [id number] PCP: Delfina Pao, MD  [facility] Providers Cardiologist:  Lonni Hanson, MD  History of Present Illness Wanda [name] is a 68 y.o. female with a hx of orthostatic hypotension, sinus tachycardia, HLD, anxiety, depression who presents for 6 month follow-up.    Zio monitor in 2022, ordered for exertional dyspnea and palpitations, showed NSR with rare ectopy, 4 atrial runs lasting up to 23.3 seconds, pSVT. Triggered events correlated with NSR with PVCs. Echo 10/28/20 showed EF 60-65% G1DD. Patient takes metoprolol  for palpitations.   Patient was last seen 09/20/2023 and was overall doing well from a cardiac perspective.  Today, the patient is overall doing well. She denies chest pain, shortness of breath, palpitations, heart racing, lightheadedness or dizziness. Patient is staying active, but she is having issues with her back. Recent A1C 6.6 and she has lost about 6 lbs. She is trying to eat more fresh foods.   Studies Reviewed EKG Interpretation Date/Time:  Tuesday March 18 2024 08:28:43 EDT Ventricular Rate:  98 PR Interval:  134 QRS Duration:  70 QT Interval:  356 QTC Calculation: 454 R Axis:   15  Text Interpretation: Normal sinus rhythm Low voltage QRS When compared with ECG of 20-Sep-2023 08:27, Criteria for Septal infarct are no longer Present Confirmed by Franchester, Karie Skowron ([id number]) on 03/18/2024 8:41:58 AM    Echo 10/28/20  1. Left ventricular ejection fraction, by estimation, is 60 to 65%. The  left ventricle has normal function. The left ventricle has no regional  wall motion abnormalities. Left ventricular diastolic parameters are  consistent with Grade I diastolic  dysfunction (impaired relaxation).   2. Right ventricular systolic function is normal. The right ventricular  size is normal. Tricuspid regurgitation signal is inadequate for assessing  PA pressure.    08/18/20 Cardiac monitor The patient was monitored for 14 days. The predominant rhythm was sinus with an average rate of 93 bpm (range 55-180 bpm). There were rare PAC's and occasional PVC's (1.1% PVC burden). Four atrial runs lasting up to 23.3 seconds occurred with a maximum rate of 174 bpm. No sustained arrhythmia or prolonged pause was identified. Patient triggered event corresponds to sinus rhythm with isolated PVC. Predominantly sinus rhythm with rare PAC's and occasional PVC's.  PSVT also noted (see details above).   Echo 05/2016 - Procedure narrative: Transthoracic echocardiography. The study    was technically difficult.  - Left ventricle: Systolic function was normal. The estimated    ejection fraction was in the range of 60% to 65%.  - Aortic valve: Valve area (Vmax): 2.13 cm^2.   Long term monitor 05/2016 The patient was monitored for 13 days, 20 hours. The predominant rhythm was sinus with an average rate of 84 bpm (range 52-170 bpm). Rare PACs and atrial couplets were identified. Rare isolated PVCs were also seen. There were 5 episodes of supraventricular tachycardia lasting up to 10.8 seconds with a maximal rate of 152 bpm. There were no sustained arrhythmias or prolonged pauses. There were no patient triggered episodes. Predominantly sinus rhythm with rare PACs and PVCs, as well as paroxysmal SVT.   Carotids 2018 IMPRESSION: Mild bilateral carotid atherosclerotic vascular disease. No flow limiting stenosis. Degree of stenosis less than 50% bilaterally. 2. Vertebral arteries are patent with antegrade flow.      Physical Exam VS:  BP 115/78 (BP Location: Left Arm, Patient Position: Sitting, Cuff Size: Normal)   Pulse  98 Comment: 105 oximeter  Ht 5' 1 (1.549 m)   Wt 151 lb 3.2 oz (68.6 kg)   LMP 09/30/1991 (Approximate) Comment: 1993  SpO2 98%   BMI 28.57 kg/m        Wt Readings from Last 3 Encounters:  03/18/24 151 lb 3.2 oz (68.6 kg)  12/11/23 156 lb 3.2  oz (70.9 kg)  09/20/23 157 lb 6.4 oz (71.4 kg)    GEN: Well nourished, well developed in no acute distress NECK: No JVD; No carotid bruits CARDIAC: RRR, no murmurs, rubs, gallops RESPIRATORY:  Clear to auscultation without rales, wheezing or rhonchi  ABDOMEN: Soft, non-tender, non-distended EXTREMITIES:  No edema; No deformity   ASSESSMENT AND PLAN  Inappropriate sinus tachycardia She denies palpitations or heart racing. Continue Toprol  50mg  daily.   HLD LDL 75, TG 296, TC 187, HDL 53. Continue Lipitor 80mg  daily. May need to consider fenofibrate at follow-up.  DM2 Recent A1C 6.6. she is working on diet changes. This is being followed by PCP.        Dispo: Follow-up in 6 months  Signed, [name] VEAR Fishman, PA-C

## 2024-04-02 ENCOUNTER — Ambulatory Visit
Admission: RE | Admit: 2024-04-02 | Discharge: 2024-04-02 | Disposition: A | Discharge status: Home / Self Care | Source: Ambulatory Visit | Attending: Pediatrics | Admitting: Pediatrics

## 2024-04-02 DIAGNOSIS — Z1231 Encounter for screening mammogram for malignant neoplasm of breast: Secondary | ICD-10-CM | POA: Insufficient documentation

## 2024-05-22 ENCOUNTER — Emergency Department

## 2024-05-22 ENCOUNTER — Emergency Department
Admission: EM | Admit: 2024-05-22 | Discharge: 2024-05-22 | Disposition: A | Discharge status: Home / Self Care | Attending: Emergency Medicine | Admitting: Emergency Medicine

## 2024-05-22 ENCOUNTER — Other Ambulatory Visit: Payer: Self-pay

## 2024-05-22 DIAGNOSIS — I10 Essential (primary) hypertension: Secondary | ICD-10-CM | POA: Diagnosis not present

## 2024-05-22 DIAGNOSIS — Z96641 Presence of right artificial hip joint: Secondary | ICD-10-CM | POA: Insufficient documentation

## 2024-05-22 DIAGNOSIS — M25551 Pain in right hip: Secondary | ICD-10-CM | POA: Diagnosis not present

## 2024-05-22 DIAGNOSIS — Z85828 Personal history of other malignant neoplasm of skin: Secondary | ICD-10-CM | POA: Insufficient documentation

## 2024-05-22 DIAGNOSIS — E119 Type 2 diabetes mellitus without complications: Secondary | ICD-10-CM | POA: Insufficient documentation

## 2024-05-22 DIAGNOSIS — M25561 Pain in right knee: Secondary | ICD-10-CM | POA: Diagnosis present

## 2024-05-22 DIAGNOSIS — M79661 Pain in right lower leg: Secondary | ICD-10-CM | POA: Diagnosis not present

## 2024-05-22 MED ORDER — ONDANSETRON HCL 4 MG/2ML IJ SOLN
4.0000 mg | Freq: Once | INTRAMUSCULAR | Status: AC
  Administered 2024-05-22: 4 mg via INTRAVENOUS
  Filled 2024-05-22: qty 2

## 2024-05-22 MED ORDER — MORPHINE SULFATE (PF) 4 MG/ML IV SOLN
4.0000 mg | Freq: Once | INTRAVENOUS | Status: AC
  Administered 2024-05-22: 4 mg via INTRAVENOUS
  Filled 2024-05-22: qty 1

## 2024-05-22 MED ORDER — OXYCODONE-ACETAMINOPHEN 5-325 MG PO TABS: 1.0000 | ORAL_TABLET | ORAL | 0 refills | Status: AC | PRN

## 2024-05-22 MED ORDER — KETOROLAC TROMETHAMINE 30 MG/ML IJ SOLN
30.0000 mg | Freq: Once | INTRAMUSCULAR | Status: AC
  Administered 2024-05-22: 30 mg via INTRAVENOUS
  Filled 2024-05-22: qty 1

## 2024-05-22 NOTE — ED Provider Notes (Signed)
 Emergency department handoff note  Care of this patient was signed out to me at the end of the previous provider shift.  All pertinent patient information was conveyed and all questions were answered.  Patient pending Doppler ultrasound of the right leg does not show any evidence of acute abnormalities.  The patient has been reexamined and is ready to be discharged.  All diagnostic results have been reviewed and discussed with the patient/family.  Care plan has been outlined and the patient/family understands all current diagnoses, results, and treatment plans.  There are no new complaints, changes, or physical findings at this time.  All questions have been addressed and answered.  Patient was instructed to, and agrees to follow-up with their primary care physician as well as return to the emergency department if any new or worsening symptoms develop.   Ural Acree K, MD 05/22/24 [phone]

## 2024-05-22 NOTE — ED Notes (Addendum)
 Pt attempted to ambulate to in room toilet, pt was unable to put weight on the right leg and stated that they were unable to bend their leg. Pt was tearful and stated that they couldn't do it. Pt was returned to the bed and helped onto a bed pan. Pt was able to roll, and lift hips, and perform pericare. Pt's bed was placed in the lowest, locked position, with call bell in reach. No other needs verbalized at this time.

## 2024-05-22 NOTE — ED Triage Notes (Signed)
 Pt BIB [facility] from home, pt c/o 10/10 pain from rt hip down to her knee after hearing a pop getting out of bed. Hx recent THR. ! G Tylenol  PTA.

## 2024-05-22 NOTE — ED Provider Notes (Signed)
 "  [facility] Provider Note    Event Date/Time   First MD Initiated Contact with Patient 05/22/24 [phone]     (approximate)   History   Knee Pain   HPI  Wanda [name] is a 68 y.o. female with history of anxiety, hypertension, hyperlipidemia, diabetes who presents to the emergency department complaints of sudden onset right hip and knee pain.  States she was walking this morning when she felt a pop in her knee that radiated up into her hip and now she describes it as a severe throbbing toothache like pain.  She did not fall to the ground and was able to still bear weight but it was very uncomfortable.  States she just had a hip replacement by [facility] Dr. Gini on December 2.  She states unfortunately she has had a difficult postoperative course with continued pain.  Incision site is healing well.  No fevers.  No new injury.  No numbness in the leg.  Does have some mild calf tenderness.  No prior history of DVT.  Denies any chest pain or shortness of breath.   History provided by patient, EMS.    Past Medical History:  Diagnosis Date   Anxiety    Basal cell carcinoma 05/03/2016   Left deltoid. Superficial.   Basal cell carcinoma 05/03/2016   Left distal lat. tricep. Nodular.   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 Tanya Serial number: [id number] Model number: [id number]     Past Surgical History:  Procedure Laterality Date   ABDOMINAL HYSTERECTOMY     APPENDECTOMY     BACK SURGERY     X 3   BREAST EXCISIONAL BIOPSY Right    surgical bx age 25    CHOLECYSTECTOMY  09/2020   COLONOSCOPY N/A 08/28/2013   Procedure: COLONOSCOPY;  Surgeon: Margo LITTIE Haddock, MD;  Location: [facility];  Service: Endoscopy;  Laterality: N/A;  8:30 AM   ENDOSCOPIC RETROGRADE CHOLANGIOPANCREATOGRAPHY (ERCP) WITH PROPOFOL  N/A 09/20/2020   Procedure: ENDOSCOPIC RETROGRADE CHOLANGIOPANCREATOGRAPHY (ERCP) WITH PROPOFOL ;  Surgeon: Jinny Carmine, MD;  Location: [facility] ENDOSCOPY;  Service: Endoscopy;  Laterality: N/A;   HIP ARTHROSCOPY W/ LABRAL REPAIR Left 2018   hip was not replaced but rod was inserted to correct injury.   INTRAMEDULLARY (IM) NAIL INTERTROCHANTERIC Left 05/30/2016   Procedure: INTRAMEDULLARY (IM) NAIL INTERTROCHANTRIC;  Surgeon: Franky Cranker, MD;  Location: [facility] ORS;  Service: Orthopedics;  Laterality: Left;   SHOULDER SURGERY Right    Spinal cord stimulator     SPINAL CORD STIMULATOR REMOVAL N/A 01/12/2020   Procedure: REMOVAL SPINAL CORD STIMULATOR AND PULSE GENERATOR;  Surgeon: Bluford Standing, MD;  Location: [facility]  ORS;  Service: Neurosurgery;  Laterality: N/A;  Local w/ MAC    MEDICATIONS:  Prior to Admission medications  Medication Sig Start Date End Date Taking? Authorizing Provider  alendronate (FOSAMAX) 70 MG tablet Take 70 mg by mouth once a week. 08/07/23   [provider]  aspirin EC 81 MG tablet ASPIRIN EC 81 MG TBEC 11/30/16   [provider]  atorvastatin  (LIPITOR) 80 MG tablet Take 1 tablet (80 mg total) by mouth daily. 03/20/23   Furth, Cadence H, PA-C  Cholecalciferol (VITAMIN D3) 5000 units TABS Take 5,000 Units by mouth daily.    [provider]   colestipol (COLESTID) 1 g tablet Take 2 g by mouth. 02/05/24 02/04/25  [provider]  esomeprazole (NEXIUM) 40 MG capsule Take 40 mg by mouth daily. 02/05/23   [provider]  famotidine  (PEPCID ) 40 MG tablet Take 40 mg by mouth at bedtime. 12/13/20   [provider]  glycopyrrolate  (ROBINUL ) 1 MG tablet Take 2 tabs po QD for excessive sweating. 12/10/23   Hester Alm BROCKS, MD  hydrOXYzine  (VISTARIL ) 25 MG capsule Take 1 capsule (25 mg total) by mouth at bedtime as needed for anxiety. 12/06/23   Eappen, Saramma, MD  metoprolol  succinate (TOPROL  XL) 50 MG 24 hr tablet Take 1 tablet (50 mg total) by mouth daily. Take with or immediately following a meal. 03/20/23   Furth, Cadence H, PA-C  Multiple Vitamin (MULTIVITAMIN) tablet Take 1 tablet by mouth daily.    [provider]  Na Sulfate-K Sulfate-Mg Sulfate concentrate (SUPREP) 17.5-3.13-1.6 GM/177ML SOLN Take 1 Bottle by mouth. 02/05/24   [provider]  naproxen  (NAPROSYN ) 250 MG tablet Take by mouth 2 (two) times daily with a meal.    [provider]  neomycin -polymyxin b -dexamethasone  (MAXITROL) 3.5-10000-0.1 SUSP Apply 1 drop to eye. 01/25/24   [provider]  Potassium Chloride  ER 20 MEQ TBCR POTASSIUM CHLORIDE  ER 20 MEQ CR-TABS 09/04/16   [provider]  potassium chloride  SA (KLOR-CON  M) 20 MEQ tablet TAKE 1 TABLET(20 MEQ) BY MOUTH TWICE DAILY 02/08/24   End, Lonni, MD  Vilazodone  HCl (VIIBRYD ) 40 MG TABS Take 1 tablet (40 mg total) by mouth daily. 12/06/23   Eappen, Saramma, MD    Physical Exam   Triage Vital Signs: ED Triage Vitals  Encounter Vitals Group     BP 05/22/24 0540 (!) 130/103     Girls Systolic BP Percentile --      Girls Diastolic BP Percentile --      Boys Systolic BP Percentile --      Boys Diastolic BP Percentile --      Pulse Rate 05/22/24 0540 100     Resp 05/22/24 0540 19     Temp 05/22/24 0540 97.6 F (36.4 C)     Temp Source 05/22/24 0540  Oral     SpO2 05/22/24 0540 100 %     Weight 05/22/24 0542 146 lb 11.2 oz (66.5 kg)     Height 05/22/24 0542 5' 1 (1.549 m)     Head Circumference --      Peak Flow --      Pain Score 05/22/24 0541 10     Pain Loc --      Pain Education --      Exclude from Growth Chart --      Most recent vital signs: Vitals:   05/22/24 0540 05/22/24 0630  BP: (!) 130/103 (!) 132/108  Pulse: 100 87  Resp: 19  Temp: 97.6 F (36.4 C)   SpO2: 100% 100%    CONSTITUTIONAL: Alert, responds appropriately to questions.  Appears uncomfortable, tearful HEAD: Normocephalic, atraumatic EYES: Conjunctivae clear, pupils appear equal, sclera nonicteric ENT: normal nose; moist mucous membranes NECK: Supple, normal ROM CARD: RRR; S1 and S2 appreciated RESP: Normal chest excursion without splinting or tachypnea; breath sounds clear and equal bilaterally; no wheezes, no rhonchi, no rales, no hypoxia or respiratory distress, speaking full sentences ABD/GI: Non-distended; soft, non-tender, no rebound, no guarding, no peritoneal signs BACK: The back appears normal EXT: Tender over the anterior lateral right hip and right knee.  Increased soft tissue swelling over the anterior and lateral right hip but no leg length discrepancy.  2+ DP pulses bilaterally.  Normal capillary refill and sensation throughout the right leg.  Compartments are soft.  Does have mild calf tenderness.  No joint effusion noted to the right knee.  Incision is healing well without redness, warmth or any open wound. SKIN: Normal color for age and race; warm; no rash on exposed skin NEURO: Moves all extremities equally, normal speech PSYCH: The patient's mood and manner are appropriate.   ED Results / Procedures / Treatments   LABS: (all labs ordered are listed, but only abnormal results are displayed) Labs Reviewed - No data to display   EKG:     RADIOLOGY: My personal review and interpretation of imaging: X-rays show no acute  abnormality.  I have personally reviewed all radiology reports.   DG Hip Unilat W or Wo Pelvis 2-3 Views Right Result Date: 05/22/2024 EXAM: 2 or 3 VIEW(S) XRAY OF THE RIGHT HIP 05/22/2024 06:47:00 AM COMPARISON: CT abdomen and pelvis 09/18/2020. CLINICAL HISTORY: possible dislocation FINDINGS: BONES AND JOINTS: There is an interval new finding of a right hip total joint arthroplasty without evidence of perihardware fractures, loosening or dislocation. Old Left hip nailing hardware is partially visible, with mild arthrosis of the left hip. There is osteopenia. There are enthesopathic changes of the pelvis and mild spurring of the SI joints and symphysis pubis. LUMBAR .  SPINE: There is spondylosis and degenerative disc disease in the lower lumbar spine. SOFT TISSUES: Pelvic phleboliths. IMPRESSION: 1. Right total hip arthroplasty without evidence of perihardware fracture, loosening, or dislocation. 2. Osteopenia and degenerative changes. Electronically signed by: Francis Quam MD 05/22/2024 07:04 AM EST RP Workstation: [id number]   DG Knee Complete 4 Views Right Result Date: 05/22/2024 EXAM: 4 VIEW(S) XRAY OF THE RIGHT KNEE 05/22/2024 06:47:00 AM COMPARISON: None available. CLINICAL HISTORY: possible dislocation FINDINGS: BONES AND JOINTS: No acute fracture. No malalignment. No significant joint effusion. There is osteopenia. There are mild features of tricompartmental degenerative arthrosis with slight spurring changes, mild subchondral sclerosis underlying the tibial plateaus. There is a small enthesophyte of the anterior superior patella. SOFT TISSUES: The soft tissues are unremarkable. IMPRESSION: 1. No evidence of fracture or dislocation. 2. Osteopenia and degenerative change . Electronically signed by: Francis Quam MD 05/22/2024 06:58 AM EST RP Workstation: [id number]     PROCEDURES:  Critical Care performed: No    Procedures    IMPRESSION / MDM / ASSESSMENT AND PLAN / ED COURSE  I  reviewed the triage vital signs and the nursing notes.    Patient here with right knee and right hip pain.  Recent hip replacement by Dr. Gini on 04/29/24.  The patient is on the cardiac monitor to evaluate for evidence of arrhythmia and/or significant heart rate changes.   DIFFERENTIAL DIAGNOSIS (includes but not limited to):  Dislocation, periprosthetic fracture, DVT, muscle strain, muscle spasm, doubt septic arthritis, cellulitis, meniscal injury, ligamentous or tendon injury, tendinitis, sprain.  No sign of compartment syndrome.   Patient's presentation is most consistent with acute complicated illness / injury requiring diagnostic workup.   PLAN: Will obtain x-rays of the right hip and knee.  Will give pain medication.  Neurovascular intact distally.  Did not fall the ground or hit her head.   MEDICATIONS GIVEN IN ED: Medications  ketorolac  (TORADOL ) 30 MG/ML injection 30 mg (has no administration in time range)  morphine  (PF) 4 MG/ML injection 4 mg (4 mg Intravenous Given 05/22/24 0613)  ondansetron  (ZOFRAN ) injection 4 mg (4 mg Intravenous Given 05/22/24 0612)     ED COURSE: Pain is improved after morphine .  X-rays reviewed and interpreted by myself and the radiologist and show no acute abnormality.  Patient is still having some calf tenderness.  Will obtain Doppler to rule out DVT but suspect ligamentous or tendon injury given she felt a pop and then had pain.  Signed out the oncoming ED provider at 7:13 AM to follow-up on Doppler and reassess.  Anticipate if this is negative and patient is feeling better and can ambulate that she can be discharged home with pain medication and orthopedic follow-up.    At this time, I do not feel there is any life-threatening condition present. I reviewed all nursing notes, vitals, pertinent previous records.  All lab and urine results, EKGs, imaging ordered have been independently reviewed and interpreted by myself.  I reviewed all available  radiology reports from any imaging ordered this visit.  Based on my assessment, I feel the patient is safe to be discharged home without further emergent workup and can continue workup as an outpatient as needed. Discussed all findings, treatment plan as well as usual and customary return precautions.  They verbalize understanding and are comfortable with this plan.  Outpatient follow-up has been provided as needed.  All questions have been answered.    CONSULTS:  none   OUTSIDE RECORDS REVIEWED: Reviewed recent family medicine, cardiology and orthopedic notes.       FINAL CLINICAL IMPRESSION(S) / ED DIAGNOSES   Final diagnoses:  Acute pain of right knee  Right hip pain     Rx / DC Orders   ED Discharge Orders     None        Note:  This document was prepared using Dragon voice recognition software and may include unintentional dictation errors.   Arval Brandstetter, Josette SAILOR, DO 05/22/24 [phone]  "

## 2024-05-22 NOTE — Discharge Instructions (Addendum)
 Please use ibuprofen (Motrin) up to 800 mg every 8 hours, naproxen (Naprosyn) up to 500 mg every 12 hours, and/or acetaminophen (Tylenol) up to 4 g/day for any continued pain.  Please do not use this medication regimen for longer than 7 days

## 2024-06-02 ENCOUNTER — Telehealth (INDEPENDENT_AMBULATORY_CARE_PROVIDER_SITE_OTHER): Payer: Self-pay | Admitting: Psychiatry

## 2024-06-02 DIAGNOSIS — F411 Generalized anxiety disorder: Secondary | ICD-10-CM

## 2024-06-02 DIAGNOSIS — F3342 Major depressive disorder, recurrent, in full remission: Secondary | ICD-10-CM

## 2024-06-02 NOTE — Progress Notes (Signed)
 Patient unable to connect for video visit, agrees to call and reschedule appointment.

## 2024-06-04 ENCOUNTER — Other Ambulatory Visit: Payer: Self-pay | Admitting: Psychiatry

## 2024-06-04 DIAGNOSIS — F4010 Social phobia, unspecified: Secondary | ICD-10-CM

## 2024-06-04 DIAGNOSIS — F411 Generalized anxiety disorder: Secondary | ICD-10-CM

## 2024-07-22 ENCOUNTER — Ambulatory Visit: Admitting: Psychiatry

## 2024-08-26 ENCOUNTER — Ambulatory Visit: Admitting: Dermatology

## 2024-09-03 ENCOUNTER — Ambulatory Visit: Admitting: Dermatology
